# Patient Record
Sex: Female | Born: 1985 | Race: Black or African American | Marital: Single | Smoking: Current some day smoker
Health system: Northeastern US, Academic
[De-identification: ages and names within clinical notes are randomized; demographics above are authoritative.]

## PROBLEM LIST (undated history)

## (undated) DIAGNOSIS — A64 Unspecified sexually transmitted disease: Secondary | ICD-10-CM

## (undated) DIAGNOSIS — T1490XA Injury, unspecified, initial encounter: Secondary | ICD-10-CM

## (undated) HISTORY — PX: SPINE SURGERY: SHX786

## (undated) HISTORY — PX: SKIN BIOPSY: SHX1

## (undated) HISTORY — DX: Injury, unspecified, initial encounter: T14.90XA

## (undated) HISTORY — PX: HERNIA REPAIR: SHX51

---
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
## 2005-04-27 DIAGNOSIS — K5909 Other constipation: Secondary | ICD-10-CM | POA: Insufficient documentation

## 2005-04-27 DIAGNOSIS — D75829 Heparin-induced thrombocytopenia, unspecified: Secondary | ICD-10-CM

## 2005-04-27 DIAGNOSIS — N319 Neuromuscular dysfunction of bladder, unspecified: Secondary | ICD-10-CM | POA: Insufficient documentation

## 2005-04-27 DIAGNOSIS — G909 Disorder of the autonomic nervous system, unspecified: Secondary | ICD-10-CM | POA: Insufficient documentation

## 2005-04-27 DIAGNOSIS — D7582 Heparin induced thrombocytopenia (HIT): Secondary | ICD-10-CM

## 2005-04-27 HISTORY — DX: Heparin-induced thrombocytopenia, unspecified: D75.829

## 2005-04-27 HISTORY — PX: OTHER SURGICAL HISTORY: SHX169

## 2005-04-27 HISTORY — DX: Heparin induced thrombocytopenia (HIT): D75.82

## 2005-04-29 ENCOUNTER — Encounter: Payer: Self-pay | Admitting: Cardiology

## 2005-04-29 DIAGNOSIS — G8254 Quadriplegia, C5-C7 incomplete: Secondary | ICD-10-CM | POA: Insufficient documentation

## 2005-04-29 DIAGNOSIS — H4900 Third [oculomotor] nerve palsy, unspecified eye: Secondary | ICD-10-CM | POA: Insufficient documentation

## 2005-04-29 DIAGNOSIS — G909 Disorder of the autonomic nervous system, unspecified: Secondary | ICD-10-CM

## 2005-04-29 DIAGNOSIS — F32A Depression, unspecified: Secondary | ICD-10-CM

## 2005-04-29 DIAGNOSIS — N319 Neuromuscular dysfunction of bladder, unspecified: Secondary | ICD-10-CM

## 2005-04-29 DIAGNOSIS — Z87440 Personal history of urinary (tract) infections: Secondary | ICD-10-CM | POA: Insufficient documentation

## 2005-04-29 HISTORY — PX: OTHER SURGICAL HISTORY: SHX169

## 2005-04-29 HISTORY — PX: CERVICAL SPINE SURGERY: SHX589

## 2005-04-29 HISTORY — PX: CRANIOTOMY: SHX93

## 2005-04-29 HISTORY — DX: Personal history of urinary (tract) infections: Z87.440

## 2005-04-29 HISTORY — DX: Disorder of the autonomic nervous system, unspecified: G90.9

## 2005-04-29 HISTORY — DX: Quadriplegia, C5-C7 incomplete: G82.54

## 2005-04-29 HISTORY — DX: Neuromuscular dysfunction of bladder, unspecified: N31.9

## 2005-04-29 HISTORY — DX: Depression, unspecified: F32.A

## 2005-04-29 HISTORY — DX: Third (oculomotor) nerve palsy, unspecified eye: H49.00

## 2005-05-04 ENCOUNTER — Encounter: Payer: Self-pay | Admitting: Cardiology

## 2005-05-04 HISTORY — PX: CERVICAL SPINE SURGERY: SHX589

## 2005-05-15 HISTORY — PX: GASTROSTOMY TUBE PLACEMENT: SHX655

## 2005-05-15 HISTORY — PX: TRACHEOSTOMY TUBE PLACEMENT: SHX814

## 2005-05-22 ENCOUNTER — Encounter: Payer: Self-pay | Admitting: Cardiology

## 2005-05-25 DIAGNOSIS — J189 Pneumonia, unspecified organism: Secondary | ICD-10-CM

## 2005-05-25 HISTORY — DX: Pneumonia, unspecified organism: J18.9

## 2005-05-28 DIAGNOSIS — F341 Dysthymic disorder: Secondary | ICD-10-CM | POA: Insufficient documentation

## 2005-05-28 DIAGNOSIS — F32A Depression, unspecified: Secondary | ICD-10-CM | POA: Insufficient documentation

## 2005-05-28 DIAGNOSIS — M62838 Other muscle spasm: Secondary | ICD-10-CM

## 2005-05-28 HISTORY — DX: Other muscle spasm: M62.838

## 2005-07-01 ENCOUNTER — Other Ambulatory Visit: Payer: Self-pay

## 2005-09-14 ENCOUNTER — Encounter: Payer: Self-pay | Admitting: Cardiology

## 2005-09-14 DIAGNOSIS — I959 Hypotension, unspecified: Secondary | ICD-10-CM

## 2005-09-14 HISTORY — DX: Hypotension, unspecified: I95.9

## 2005-10-28 DIAGNOSIS — M86172 Other acute osteomyelitis, left ankle and foot: Secondary | ICD-10-CM

## 2005-10-28 HISTORY — DX: Other acute osteomyelitis, left ankle and foot: M86.172

## 2006-09-27 DIAGNOSIS — M4620 Osteomyelitis of vertebra, site unspecified: Secondary | ICD-10-CM

## 2006-09-27 HISTORY — DX: Osteomyelitis of vertebra, site unspecified: M46.20

## 2007-02-22 ENCOUNTER — Encounter: Payer: Self-pay | Admitting: Cardiology

## 2007-03-29 DIAGNOSIS — L89159 Pressure ulcer of sacral region, unspecified stage: Secondary | ICD-10-CM

## 2007-03-29 HISTORY — DX: Pressure ulcer of sacral region, unspecified stage: L89.159

## 2007-06-01 ENCOUNTER — Encounter: Payer: Self-pay | Admitting: Cardiology

## 2007-06-01 HISTORY — PX: OTHER SURGICAL HISTORY: SHX169

## 2008-06-19 ENCOUNTER — Encounter: Payer: Self-pay | Admitting: Cardiology

## 2008-12-28 DIAGNOSIS — E46 Unspecified protein-calorie malnutrition: Secondary | ICD-10-CM

## 2008-12-28 HISTORY — DX: Unspecified protein-calorie malnutrition: E46

## 2009-02-17 DIAGNOSIS — M4628 Osteomyelitis of vertebra, sacral and sacrococcygeal region: Secondary | ICD-10-CM

## 2009-02-17 HISTORY — DX: Osteomyelitis of vertebra, sacral and sacrococcygeal region: M46.28

## 2009-07-30 DIAGNOSIS — M869 Osteomyelitis, unspecified: Secondary | ICD-10-CM

## 2009-07-30 HISTORY — DX: Osteomyelitis, unspecified: M86.9

## 2009-10-25 ENCOUNTER — Ambulatory Visit
Admit: 2009-10-25 | Discharge: 2009-10-25 | Disposition: A | Discharge status: Home / Self Care | Payer: Self-pay | Source: Ambulatory Visit | Attending: Surgery | Admitting: Surgery

## 2009-11-04 ENCOUNTER — Ambulatory Visit: Payer: Self-pay | Admitting: Obstetrics & Gynecology

## 2009-11-04 ENCOUNTER — Ambulatory Visit: Payer: Self-pay

## 2009-11-11 ENCOUNTER — Ambulatory Visit
Admit: 2009-11-11 | Discharge: 2009-11-11 | Disposition: A | Discharge status: Home / Self Care | Payer: Self-pay | Source: Ambulatory Visit | Attending: Urology | Admitting: Urology

## 2009-11-11 ENCOUNTER — Ambulatory Visit: Payer: Self-pay | Admitting: Physical Medicine and Rehabilitation

## 2009-11-11 LAB — COMPREHENSIVE METABOLIC PANEL
ALT: 11 U/L (ref 0–35)
AST: 18 U/L (ref 0–35)
Albumin: 4.4 g/dL (ref 3.5–5.2)
Alk Phos: 71 U/L (ref 35–105)
Anion Gap: 12 (ref 7–16)
Bilirubin,Total: 0.3 mg/dL (ref 0.0–1.2)
CO2: 23 mmol/L (ref 20–28)
Calcium: 9.1 mg/dL (ref 9.0–10.4)
Chloride: 108 mmol/L (ref 96–108)
Creatinine: 0.44 mg/dL — ABNORMAL LOW (ref 0.51–0.95)
GFR,Black: >59 *
GFR,Caucasian: >59 *
Glucose: 91 mg/dL (ref 74–106)
Lab: 9 mg/dL (ref 6–20)
Potassium: 3.3 mmol/L (ref 3.3–5.1)
Sodium: 143 mmol/L (ref 133–145)
Total Protein: 7.6 g/dL (ref 6.3–7.7)

## 2009-11-11 LAB — URINE MICROSCOPIC (IQ200)

## 2009-11-11 LAB — PREGNANCY, URINE: Preg Test,UR: NEGATIVE m[IU]/mL

## 2009-11-11 LAB — URINALYSIS WITH REFLEX TO MICROSCOPIC
Nitrite,UA: POSITIVE — AB
Protein,UA: 30 mg/dL — AB
Specific Gravity,UA: 1.02 (ref 1.002–1.030)
pH,UA: 6 (ref 5.0–8.0)

## 2009-11-11 LAB — CBC
Hematocrit: 38 % (ref 34–45)
Hemoglobin: 12.5 g/dL (ref 11.2–15.7)
MCV: 95 fL (ref 79–95)
Platelets: 97 THOU/uL — ABNORMAL LOW (ref 160–370)
RBC: 4 MIL/uL (ref 3.9–5.2)
RDW: 16.8 % — ABNORMAL HIGH (ref 11.7–14.4)
WBC: 6.7 THOU/uL (ref 4.0–10.0)

## 2009-11-11 LAB — DATE/TIME NOT PROVIDED

## 2009-11-12 LAB — AEROBIC CULTURE

## 2009-11-14 ENCOUNTER — Ambulatory Visit: Admit: 2009-11-14 | Payer: Self-pay | Source: Ambulatory Visit | Admitting: Urology
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
## 2009-11-17 ENCOUNTER — Other Ambulatory Visit: Payer: Self-pay | Admitting: Gastroenterology

## 2009-11-17 ENCOUNTER — Encounter: Payer: Self-pay | Admitting: Cardiology

## 2009-11-17 ENCOUNTER — Inpatient Hospital Stay
Admit: 2009-11-17 | Disposition: A | Discharge status: Home / Self Care | Payer: Self-pay | Source: Ambulatory Visit | Attending: Pulmonology | Admitting: Pulmonology

## 2009-11-17 LAB — CBC AND DIFFERENTIAL
Baso # K/uL: 0 THOU/uL (ref 0.0–0.1)
Basophil %: 0 % (ref 0.1–1.2)
Eos # K/uL: 0 THOU/uL (ref 0.0–0.4)
Eosinophil %: 0 % — ABNORMAL LOW (ref 0.7–5.8)
Hematocrit: 36 % (ref 34–45)
Hemoglobin: 11.9 g/dL (ref 11.2–15.7)
Lymph # K/uL: 1.1 THOU/uL — ABNORMAL LOW (ref 1.2–3.7)
Lymphocyte %: 17 % — ABNORMAL LOW (ref 19.3–51.7)
MCV: 92 fL (ref 79–95)
Mono # K/uL: 0.1 THOU/uL — ABNORMAL LOW (ref 0.2–0.9)
Monocyte %: 2 % — ABNORMAL LOW (ref 4.7–12.5)
Neut # K/uL: 4.7 THOU/uL (ref 1.6–6.1)
Platelets: 70 THOU/uL — ABNORMAL LOW (ref 160–370)
RBC: 3.9 MIL/uL (ref 3.9–5.2)
RDW: 15.9 % — ABNORMAL HIGH (ref 11.7–14.4)
Seg Neut %: 63 % (ref 34.0–71.1)
WBC: 5.9 THOU/uL (ref 4.0–10.0)

## 2009-11-17 LAB — URINE MICROSCOPIC (IQ200)
RBC,UA: 56 /HPF — ABNORMAL HIGH (ref 0–2)
WBC,UA: 7 /HPF — AB (ref 0–5)

## 2009-11-17 LAB — URINALYSIS WITH REFLEX TO MICROSCOPIC
Leuk Esterase,UA: NEGATIVE
Nitrite,UA: NEGATIVE
Protein,UA: 300 mg/dL — AB
Specific Gravity,UA: 1.027 (ref 1.002–1.030)
pH,UA: 6 (ref 5.0–8.0)

## 2009-11-17 LAB — PLASMA PROF 7 (ED ONLY)
Anion Gap,PL: 12 (ref 7–16)
CO2,Plasma: 23 mmol/L (ref 20–28)
Chloride,Plasma: 101 mmol/L (ref 96–108)
Creatinine: 0.5 mg/dL — ABNORMAL LOW (ref 0.51–0.95)
GFR,Black: >59 *
GFR,Caucasian: >59 *
Glucose,Plasma: 96 mg/dL (ref 74–106)
Potassium,Plasma: 3.3 mmol/L — ABNORMAL LOW (ref 3.4–4.7)
Sodium,Plasma: 136 mmol/L (ref 132–146)
UN,Plasma: 15 mg/dL (ref 6–20)

## 2009-11-17 LAB — REACTIVE LYMPHS: React Lymph %: 1 % (ref 0–6)

## 2009-11-17 LAB — MANUAL DIFFERENTIAL

## 2009-11-17 LAB — LACTATE, PLASMA: Lactate: 1.4 mmol/L (ref 0.5–2.2)

## 2009-11-17 LAB — BANDS: Bands %: 17 % — ABNORMAL HIGH (ref 0–10)

## 2009-11-17 LAB — HOLD SST

## 2009-11-17 LAB — HOLD BLUE

## 2009-11-17 LAB — S. PNEUMONIAE ANTIGEN: S. pneumoniae Antigen: DETECTED

## 2009-11-17 LAB — HOLD RED: Hold Red: 1

## 2009-11-18 DIAGNOSIS — D649 Anemia, unspecified: Secondary | ICD-10-CM

## 2009-11-18 HISTORY — DX: Anemia, unspecified: D64.9

## 2009-11-18 LAB — CBC AND DIFFERENTIAL
Baso # K/uL: 0 THOU/uL (ref 0.0–0.1)
Basophil %: 0 % (ref 0.1–1.2)
Eos # K/uL: 0 THOU/uL (ref 0.0–0.4)
Eosinophil %: 0.5 % — ABNORMAL LOW (ref 0.7–5.8)
Hematocrit: 30 % — ABNORMAL LOW (ref 34–45)
Hemoglobin: 9.4 g/dL — ABNORMAL LOW (ref 11.2–15.7)
Lymph # K/uL: 1 THOU/uL — ABNORMAL LOW (ref 1.2–3.7)
Lymphocyte %: 16.6 % — ABNORMAL LOW (ref 19.3–51.7)
MCV: 91 fL (ref 79–95)
Mono # K/uL: 0.2 THOU/uL (ref 0.2–0.9)
Monocyte %: 4 % — ABNORMAL LOW (ref 4.7–12.5)
Neut # K/uL: 4.8 THOU/uL (ref 1.6–6.1)
Platelets: 68 THOU/uL — ABNORMAL LOW (ref 160–370)
RBC: 3.3 MIL/uL — ABNORMAL LOW (ref 3.9–5.2)
RDW: 15.8 % — ABNORMAL HIGH (ref 11.7–14.4)
Seg Neut %: 78.9 % — ABNORMAL HIGH (ref 34.0–71.1)
WBC: 6 THOU/uL (ref 4.0–10.0)

## 2009-11-18 LAB — ARTERIAL BLOOD PANEL, ICU
Glucose,WB: 86 mg/dL (ref 65–110)
ICA @7.4,WB: 4.7 mg/dL — ABNORMAL LOW (ref 4.8–5.2)
ICA Uncorr,WB: 4.8 mg/dL
Lactate ART,WB: 1.3 mmol/L — ABNORMAL HIGH (ref 0.3–0.8)
NA, WB: 142 mmol/L (ref 135–145)
Potassium,WB: 3.4 mmol/L (ref 3.4–4.7)

## 2009-11-18 LAB — BASIC METABOLIC PANEL
Anion Gap: 11 (ref 7–16)
CO2: 23 mmol/L (ref 20–28)
Calcium: 7.6 mg/dL — ABNORMAL LOW (ref 9.0–10.4)
Chloride: 109 mmol/L — ABNORMAL HIGH (ref 96–108)
Creatinine: 0.43 mg/dL — ABNORMAL LOW (ref 0.51–0.95)
GFR,Black: >59 *
GFR,Caucasian: >59 *
Glucose: 108 mg/dL — ABNORMAL HIGH (ref 74–106)
Lab: 8 mg/dL (ref 6–20)
Potassium: 3.3 mmol/L (ref 3.3–5.1)
Sodium: 143 mmol/L (ref 133–145)

## 2009-11-18 LAB — RESP THERAPY TYPE

## 2009-11-18 LAB — BLOOD GASES, ARTERIAL, ICU
Base Excess, Arterial: 0 mmol/L (ref ?–2)
Buffer Base: 46.9 mmol/L
CO2,ART (Calc): 28 mmol/L (ref 21–28)
CO: 1.2 %
FO2 Hb, Arterial: 83 % — ABNORMAL LOW (ref 90–95)
HCO3, Arterial: 26 mmol/L — ABNORMAL HIGH (ref 19–23)
Hemoglobin: 12.6 g/dL (ref 11.2–15.7)
Methemoglobin: 0.3 % (ref 0.0–1.0)
O2 Content, Arterial: 14 mL/dL — ABNORMAL LOW (ref 16.3–21.7)
pCO2, Arterial: 48 mmHg — ABNORMAL HIGH (ref 33–43)
pH: 7.36 (ref 7.36–7.44)
pO2,Arterial: 52 mmHg — ABNORMAL LOW (ref 80–100)

## 2009-11-18 LAB — IONIZED CALCIUM,SERUM: Ionized CA Uncorrected: 4.2 mg/dL

## 2009-11-18 LAB — GRAM STAIN
Gram Stain: >10
Gram Stain: <=10

## 2009-11-18 LAB — PREALBUMIN: Prealbumin: 6 mg/dL — ABNORMAL LOW (ref 20–40)

## 2009-11-19 LAB — CBC AND DIFFERENTIAL
Baso # K/uL: 0 THOU/uL (ref 0.0–0.1)
Baso # K/uL: 0 THOU/uL (ref 0.0–0.1)
Basophil %: 0.1 % (ref 0.1–1.2)
Basophil %: 0.1 % (ref 0.1–1.2)
Eos # K/uL: 0.1 THOU/uL (ref 0.0–0.4)
Eos # K/uL: 0.1 THOU/uL (ref 0.0–0.4)
Eosinophil %: 1 % (ref 0.7–5.8)
Eosinophil %: 1.2 % (ref 0.7–5.8)
Hematocrit: 30 % — ABNORMAL LOW (ref 34–45)
Hematocrit: 29 % — ABNORMAL LOW (ref 34–45)
Hemoglobin: 9.7 g/dL — ABNORMAL LOW (ref 11.2–15.7)
Hemoglobin: 9.1 g/dL — ABNORMAL LOW (ref 11.2–15.7)
Lymph # K/uL: 1.5 THOU/uL (ref 1.2–3.7)
Lymph # K/uL: 1.6 THOU/uL (ref 1.2–3.7)
Lymphocyte %: 16.9 % — ABNORMAL LOW (ref 19.3–51.7)
Lymphocyte %: 21 % (ref 19.3–51.7)
MCV: 91 fL (ref 79–95)
MCV: 90 fL (ref 79–95)
Mono # K/uL: 0.6 THOU/uL (ref 0.2–0.9)
Mono # K/uL: 0.6 THOU/uL (ref 0.2–0.9)
Monocyte %: 6.7 % (ref 4.7–12.5)
Monocyte %: 7.3 % (ref 4.7–12.5)
Neut # K/uL: 6.7 THOU/uL — ABNORMAL HIGH (ref 1.6–6.1)
Neut # K/uL: 5.5 THOU/uL (ref 1.6–6.1)
Platelets: 77 THOU/uL — ABNORMAL LOW (ref 160–370)
Platelets: 82 THOU/uL — ABNORMAL LOW (ref 160–370)
RBC: 3.3 MIL/uL — ABNORMAL LOW (ref 3.9–5.2)
RBC: 3.3 MIL/uL — ABNORMAL LOW (ref 3.9–5.2)
RDW: 15.9 % — ABNORMAL HIGH (ref 11.7–14.4)
RDW: 15.9 % — ABNORMAL HIGH (ref 11.7–14.4)
Seg Neut %: 75.3 % — ABNORMAL HIGH (ref 34.0–71.1)
Seg Neut %: 70.4 % (ref 34.0–71.1)
WBC: 8.9 THOU/uL (ref 4.0–10.0)
WBC: 7.8 THOU/uL (ref 4.0–10.0)

## 2009-11-19 LAB — RESP THERAPY TYPE

## 2009-11-19 LAB — BASIC METABOLIC PANEL
Anion Gap: 10 (ref 7–16)
CO2: 25 mmol/L (ref 20–28)
Calcium: 8.3 mg/dL — ABNORMAL LOW (ref 9.0–10.4)
Chloride: 108 mmol/L (ref 96–108)
Creatinine: 0.39 mg/dL — ABNORMAL LOW (ref 0.51–0.95)
GFR,Black: >59 *
GFR,Caucasian: >59 *
Glucose: 76 mg/dL (ref 74–106)
Lab: 6 mg/dL (ref 6–20)
Potassium: 3.4 mmol/L (ref 3.3–5.1)
Sodium: 143 mmol/L (ref 133–145)

## 2009-11-19 LAB — PROTIME-INR
INR: 1.3 — ABNORMAL HIGH (ref 0.9–1.1)
Protime: 16 s — ABNORMAL HIGH (ref 11.9–14.7)

## 2009-11-19 LAB — COMPREHENSIVE METABOLIC PANEL
ALT: 14 U/L (ref 0–35)
AST: 22 U/L (ref 0–35)
Albumin: 3 g/dL — ABNORMAL LOW (ref 3.5–5.2)
Alk Phos: 61 U/L (ref 35–105)
Anion Gap: 9 (ref 7–16)
Bilirubin,Total: 0.7 mg/dL (ref 0.0–1.2)
CO2: 25 mmol/L (ref 20–28)
Calcium: 8.2 mg/dL — ABNORMAL LOW (ref 9.0–10.4)
Chloride: 108 mmol/L (ref 96–108)
Creatinine: 0.36 mg/dL — ABNORMAL LOW (ref 0.51–0.95)
GFR,Black: >59 *
GFR,Caucasian: >59 *
Glucose: 79 mg/dL (ref 74–106)
Lab: 5 mg/dL — ABNORMAL LOW (ref 6–20)
Potassium: 3.2 mmol/L — ABNORMAL LOW (ref 3.3–5.1)
Sodium: 142 mmol/L (ref 133–145)
Total Protein: 5.8 g/dL — ABNORMAL LOW (ref 6.3–7.7)

## 2009-11-19 LAB — BLOOD GASES, ARTERIAL, ICU
Base Excess, Arterial: 1 mmol/L (ref ?–2)
Buffer Base: 47.5 mmol/L
CO2,ART (Calc): 28 mmol/L (ref 21–28)
CO: 0.4 %
FO2 Hb, Arterial: 95 % (ref 90–95)
HCO3, Arterial: 26 mmol/L — ABNORMAL HIGH (ref 19–23)
Hemoglobin: 11.6 g/dL (ref 11.2–15.7)
Inspired Air: 100
Methemoglobin: 0 % (ref 0.0–1.0)
O2 Content, Arterial: 14.8 mL/dL — ABNORMAL LOW (ref 16.3–21.7)
pCO2, Arterial: 46 mmHg — ABNORMAL HIGH (ref 33–43)
pH: 7.38 (ref 7.36–7.44)
pO2,Arterial: 78 mmHg — ABNORMAL LOW (ref 80–100)

## 2009-11-19 LAB — WHOLE BLOOD CALCIUM IONIZED
ICA @7.4,WB: 4.7 mg/dL — ABNORMAL LOW (ref 4.8–5.2)
ICA @7.4,WB: 4.7 mg/dL — ABNORMAL LOW (ref 4.8–5.2)
ICA Uncorr,WB: 4.8 mg/dL
ICA Uncorr,WB: 4.6 mg/dL

## 2009-11-19 LAB — MAGNESIUM: Magnesium: 1.5 meq/L (ref 1.3–2.1)

## 2009-11-19 LAB — BILIRUBIN, DIRECT: Bilirubin,Direct: 0.4 mg/dL — ABNORMAL HIGH (ref 0.0–0.3)

## 2009-11-19 LAB — LACTATE, PLASMA: Lactate: 0.7 mmol/L (ref 0.5–2.2)

## 2009-11-19 LAB — HOLD RED: Hold Red: 1

## 2009-11-19 LAB — APTT: aPTT: 36.4 s — ABNORMAL HIGH (ref 22.3–35.3)

## 2009-11-19 LAB — PHOSPHORUS: Phosphorus: 1.7 mg/dL — ABNORMAL LOW (ref 2.7–4.5)

## 2009-11-19 LAB — GGT: GGT: 35 U/L (ref 5–36)

## 2009-11-19 NOTE — ED Provider Notes (Unsigned)
VISIT NUMBER:  [id number].    I saw and evaluated the patient.  I agree with the resident's/fellow's  findings and plan of care as documented.  Details of my evaluation are as  follows:    Seen by me at approximately 4:05 p.m.    HPI:  This is a 23 year old woman with a history of quadriplegia 4 years  ago who presents with chest pain on her right side, shortness of breath,  fever, and, over the last 24 hours, also foul-smelling urine.  The patient  has been on outpatient antibiotics amoxicillin and ceftriaxone and Cipro  without relief in her symptoms.  She denies nausea, vomiting, diarrhea.  She is not more dyspneic than usual.    ALLERGIES:   Heparin, vancomycin, and Macrobid.  MEDICATIONS:  Amoxicillin.  Ceftriaxone.  Cipro.    PAST MEDICAL / SURGICAL HISTORY:  Frequent UTIs.  Chronic Foley.  Neurogenic bladder.  Quadriplegia.  A decubitus ulcer for which she was hospitalized a year ago which she now  says is healed.    FAMILY HISTORY:  None related to this visit.    SOCIAL HISTORY:  Here with her mom.    REVIEW OF SYSTEMS:  General:  Fevers.  HEENT:  Cough.  Pulmonary:  Shortness of breath and chest pain.  GI:  No vomiting, no diarrhea.  GU:  Foul-smelling urine and has an indwelling Foley.  Neurologic:  Quadriplegia.  All other systems reviewed and negative.    PHYSICAL EXAMINATION:  The patient is awake and alert.  She is tachycardic to 116.  Temperature:  38.3.  Blood pressure here:  96/50.  O2 saturation:  97% on 4 L.  Eyes:  Conjunctivae pink.  HENT:  Dry mucous membranes.  Heart:  Tachycardic, regular rate and rhythm, no murmur.  Pulmonary:  She has crackles at both bases.  Abdomen:  Soft, nontender.  Skin:  Normal color.  Extremities:  She has contractures in her upper and lower extremities.  She  is able to flex at the elbow and lift her arms.  Psychiatric:  Normal affect.    MEDICAL DECISION MAKING/CONDITION:  This is a patient with quadriplegia and  cough, chest pain, and shortness of breath.         DDX:  My major concern is that she has pneumonia, a UTI, or other        serious infection that she is at high risk for.  She is not hypoxic.          PLAN:  She will be switched to Avelox and admitted to the hospital  for IV antibiotics.    DATA:        LABS:  Her lactate came back 1.1.  Her white count was 5.9,        hematocrit of 36.  Her electrolytes are significant for mild        hypokalemia with a potassium of 3.3 which we will replete.        RADIOLOGIC STUDIES:  Her chest x-ray shows a left lower lobe  infiltrate with effusion.        ECG:        RHYTHM STRIPS:    PROCEDURE (Attestation):  CRITICAL CARE TIME (Time to perform separately billable procedures  subtracted from CC time):  CONSULT:  PCP NOTIFIED:  SMOKING CESSATION COUNSELING:    FOLLOW-UP NOTE AND DISPOSITION:    ED DIAGNOSIS:  Pneumonia with effusion.  Unreviewed              ___________________________________________  Pauline Good, MD      DD:   11/17/2009  DT:   11/17/2009  5:15 P  ZOX/WR6#[id number]  [id number]    cc:
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
## 2009-11-20 LAB — APTT: aPTT: 38.8 s — ABNORMAL HIGH (ref 22.3–35.3)

## 2009-11-20 LAB — CBC
Hematocrit: 28 % — ABNORMAL LOW (ref 34–45)
Hemoglobin: 9.1 g/dL — ABNORMAL LOW (ref 11.2–15.7)
MCV: 93 fL (ref 79–95)
Platelets: 83 THOU/uL — ABNORMAL LOW (ref 160–370)
RBC: 3.1 MIL/uL — ABNORMAL LOW (ref 3.9–5.2)
RDW: 15.9 % — ABNORMAL HIGH (ref 11.7–14.4)
WBC: 7.8 THOU/uL (ref 4.0–10.0)

## 2009-11-20 LAB — BLOOD GASES, ARTERIAL, ICU
Base Excess, Arterial: 4 mmol/L — ABNORMAL HIGH (ref ?–2)
Buffer Base: 49.5 mmol/L
CO2,ART (Calc): 30 mmol/L — ABNORMAL HIGH (ref 21–28)
CO: 0.1 %
FO2 Hb, Arterial: 97 % — ABNORMAL HIGH (ref 90–95)
HCO3, Arterial: 28 mmol/L — ABNORMAL HIGH (ref 19–23)
Hemoglobin: 9.2 g/dL — ABNORMAL LOW (ref 11.2–15.7)
Inspired Air: 80
Methemoglobin: 0.1 % (ref 0.0–1.0)
O2 Content, Arterial: 12.1 mL/dL — ABNORMAL LOW (ref 16.3–21.7)
pCO2, Arterial: 44 mmHg — ABNORMAL HIGH (ref 33–43)
pH: 7.42 (ref 7.36–7.44)
pO2,Arterial: 88 mmHg (ref 80–100)

## 2009-11-20 LAB — LACTATE, PLASMA: Lactate: 0.7 mmol/L (ref 0.5–2.2)

## 2009-11-20 LAB — ARTERIAL BLOOD PANEL, ICU
Glucose,WB: 100 mg/dL (ref 65–110)
ICA @7.4,WB: 4.6 mg/dL — ABNORMAL LOW (ref 4.8–5.2)
ICA Uncorr,WB: 4.6 mg/dL
Lactate ART,WB: 0.7 mmol/L (ref 0.3–0.8)
NA, WB: 138 mmol/L (ref 135–145)
Potassium,WB: 3.6 mmol/L (ref 3.4–4.7)

## 2009-11-20 LAB — PHOSPHORUS: Phosphorus: 2 mg/dL — ABNORMAL LOW (ref 2.7–4.5)

## 2009-11-20 LAB — BASIC METABOLIC PANEL
Anion Gap: 11 (ref 7–16)
CO2: 24 mmol/L (ref 20–28)
Calcium: 7.9 mg/dL — ABNORMAL LOW (ref 9.0–10.4)
Chloride: 110 mmol/L — ABNORMAL HIGH (ref 96–108)
Creatinine: 0.48 mg/dL — ABNORMAL LOW (ref 0.51–0.95)
GFR,Black: >59 *
GFR,Caucasian: >59 *
Glucose: 97 mg/dL (ref 74–106)
Lab: 4 mg/dL — ABNORMAL LOW (ref 6–20)
Potassium: 3.5 mmol/L (ref 3.3–5.1)
Sodium: 145 mmol/L (ref 133–145)

## 2009-11-20 LAB — RESP THERAPY TYPE
PEEP: 12 cmH2O
Tidal Volume: 450 mL
Vent Rate: 14 {beats}/min

## 2009-11-20 LAB — MAGNESIUM: Magnesium: 1.4 meq/L (ref 1.3–2.1)

## 2009-11-20 LAB — PROTIME-INR
INR: 1.3 — ABNORMAL HIGH (ref 0.9–1.1)
Protime: 16.9 s — ABNORMAL HIGH (ref 11.9–14.7)

## 2009-11-20 LAB — WHOLE BLOOD CALCIUM IONIZED
ICA @7.4,WB: 4.7 mg/dL — ABNORMAL LOW (ref 4.8–5.2)
ICA Uncorr,WB: 4.6 mg/dL

## 2009-11-20 LAB — POCT GLUCOSE: Glucose POCT: 97 mg/dL (ref 74–106)

## 2009-11-20 LAB — AEROBIC CULTURE: Aerobic Culture: NORMAL

## 2009-11-21 LAB — GIANT PLATELETS

## 2009-11-21 LAB — CBC AND DIFFERENTIAL
Baso # K/uL: 0 THOU/uL (ref 0.0–0.1)
Basophil %: 0 % (ref 0.1–1.2)
Eos # K/uL: 0.3 THOU/uL (ref 0.0–0.4)
Eosinophil %: 4 % (ref 0.7–5.8)
Hematocrit: 26 % — ABNORMAL LOW (ref 34–45)
Hemoglobin: 8.2 g/dL — ABNORMAL LOW (ref 11.2–15.7)
Lymph # K/uL: 3.1 THOU/uL (ref 1.2–3.7)
Lymphocyte %: 39 % (ref 19.3–51.7)
MCV: 91 fL (ref 79–95)
Mono # K/uL: 0.5 THOU/uL (ref 0.2–0.9)
Monocyte %: 6 % (ref 4.7–12.5)
Neut # K/uL: 4.1 THOU/uL (ref 1.6–6.1)
Platelets: 88 THOU/uL — ABNORMAL LOW (ref 160–370)
RBC: 2.9 MIL/uL — ABNORMAL LOW (ref 3.9–5.2)
RDW: 16 % — ABNORMAL HIGH (ref 11.7–14.4)
Seg Neut %: 50 % (ref 34.0–71.1)
WBC: 8 THOU/uL (ref 4.0–10.0)

## 2009-11-21 LAB — MISC. CELL %: Misc. Cell %: 0 % (ref 0–0)

## 2009-11-21 LAB — ARTERIAL BLOOD PANEL, ICU
Glucose,WB: 135 mg/dL — ABNORMAL HIGH (ref 65–110)
ICA @7.4,WB: 4.7 mg/dL — ABNORMAL LOW (ref 4.8–5.2)
ICA Uncorr,WB: 4.8 mg/dL
Lactate ART,WB: 1.1 mmol/L — ABNORMAL HIGH (ref 0.3–0.8)
NA, WB: 140 mmol/L (ref 135–145)
Potassium,WB: 4.2 mmol/L (ref 3.4–4.7)

## 2009-11-21 LAB — BLOOD GASES, ARTERIAL, ICU
Base Excess, Arterial: 2 mmol/L (ref ?–2)
Buffer Base: 48.6 mmol/L
CO2,ART (Calc): 29 mmol/L — ABNORMAL HIGH (ref 21–28)
CO: 0.5 %
FO2 Hb, Arterial: 93 % (ref 90–95)
HCO3, Arterial: 28 mmol/L — ABNORMAL HIGH (ref 19–23)
Hemoglobin: 11.8 g/dL (ref 11.2–15.7)
Inspired Air: 60
Methemoglobin: 0.1 % (ref 0.0–1.0)
O2 Content, Arterial: 14.7 mL/dL — ABNORMAL LOW (ref 16.3–21.7)
pCO2, Arterial: 49 mmHg — ABNORMAL HIGH (ref 33–43)
pH: 7.37 (ref 7.36–7.44)
pO2,Arterial: 70 mmHg — ABNORMAL LOW (ref 80–100)

## 2009-11-21 LAB — BANDS: Bands %: 1 % (ref 0–10)

## 2009-11-21 LAB — PREALBUMIN: Prealbumin: 4 mg/dL — ABNORMAL LOW (ref 20–40)

## 2009-11-21 LAB — BASIC METABOLIC PANEL
Anion Gap: 8 (ref 7–16)
CO2: 26 mmol/L (ref 20–28)
Calcium: 8.2 mg/dL — ABNORMAL LOW (ref 9.0–10.4)
Chloride: 106 mmol/L (ref 96–108)
Creatinine: 0.49 mg/dL — ABNORMAL LOW (ref 0.51–0.95)
GFR,Black: >59 *
GFR,Caucasian: >59 *
Glucose: 133 mg/dL — ABNORMAL HIGH (ref 74–106)
Lab: 3 mg/dL — ABNORMAL LOW (ref 6–20)
Potassium: 3.3 mmol/L (ref 3.3–5.1)
Sodium: 140 mmol/L (ref 133–145)

## 2009-11-21 LAB — RESP THERAPY TYPE
PEEP: 10 cmH2O
Tidal Volume: 450 mL
Vent Rate: 14 {beats}/min

## 2009-11-21 LAB — AEROBIC CULTURE

## 2009-11-21 LAB — MANUAL DIFFERENTIAL

## 2009-11-21 LAB — PHOSPHORUS: Phosphorus: 1.9 mg/dL — ABNORMAL LOW (ref 2.7–4.5)

## 2009-11-21 LAB — MAGNESIUM: Magnesium: 1.4 meq/L (ref 1.3–2.1)

## 2009-11-22 LAB — BASIC METABOLIC PANEL
Anion Gap: 4 — ABNORMAL LOW (ref 7–16)
CO2: 25 mmol/L (ref 20–28)
Calcium: 8.1 mg/dL — ABNORMAL LOW (ref 9.0–10.4)
Chloride: 109 mmol/L — ABNORMAL HIGH (ref 96–108)
Creatinine: 0.43 mg/dL — ABNORMAL LOW (ref 0.51–0.95)
GFR,Black: >59 *
GFR,Caucasian: >59 *
Glucose: 143 mg/dL — ABNORMAL HIGH (ref 74–106)
Lab: 5 mg/dL — ABNORMAL LOW (ref 6–20)
Potassium: 4.9 mmol/L (ref 3.3–5.1)
Sodium: 138 mmol/L (ref 133–145)

## 2009-11-22 LAB — REACTIVE LYMPHS: React Lymph %: 2 % (ref 0–6)

## 2009-11-22 LAB — MISC. CELL %: Misc. Cell %: 0 % (ref 0–0)

## 2009-11-22 LAB — CBC AND DIFFERENTIAL
Baso # K/uL: 0 THOU/uL (ref 0.0–0.1)
Basophil %: 0 % (ref 0.1–1.2)
Eos # K/uL: 0.3 THOU/uL (ref 0.0–0.4)
Eosinophil %: 3 % (ref 0.7–5.8)
Hematocrit: 26 % — ABNORMAL LOW (ref 34–45)
Hemoglobin: 8.2 g/dL — ABNORMAL LOW (ref 11.2–15.7)
Lymph # K/uL: 1.8 THOU/uL (ref 1.2–3.7)
Lymphocyte %: 19 % — ABNORMAL LOW (ref 19.3–51.7)
MCV: 93 fL (ref 79–95)
Mono # K/uL: 0.7 THOU/uL (ref 0.2–0.9)
Monocyte %: 8 % (ref 4.7–12.5)
Neut # K/uL: 5.9 THOU/uL (ref 1.6–6.1)
Platelets: 83 THOU/uL — ABNORMAL LOW (ref 160–370)
RBC: 2.8 MIL/uL — ABNORMAL LOW (ref 3.9–5.2)
RDW: 16.3 % — ABNORMAL HIGH (ref 11.7–14.4)
Seg Neut %: 69 % (ref 34.0–71.1)
WBC: 8.5 THOU/uL (ref 4.0–10.0)

## 2009-11-22 LAB — MAGNESIUM: Magnesium: 1.5 meq/L (ref 1.3–2.1)

## 2009-11-22 LAB — PHOSPHORUS: Phosphorus: 1.9 mg/dL — ABNORMAL LOW (ref 2.7–4.5)

## 2009-11-22 LAB — MANUAL DIFFERENTIAL

## 2009-11-22 LAB — GIANT PLATELETS

## 2009-11-23 LAB — CBC AND DIFFERENTIAL
Baso # K/uL: 0 THOU/uL (ref 0.0–0.1)
Basophil %: 0 % (ref 0.1–1.2)
Eos # K/uL: 0.3 THOU/uL (ref 0.0–0.4)
Eosinophil %: 4 % (ref 0.7–5.8)
Hematocrit: 24 % — ABNORMAL LOW (ref 34–45)
Hemoglobin: 8.2 g/dL — ABNORMAL LOW (ref 11.2–15.7)
Lymph # K/uL: 1.9 THOU/uL (ref 1.2–3.7)
Lymphocyte %: 21 % (ref 19.3–51.7)
MCV: 97 fL — ABNORMAL HIGH (ref 79–95)
Mono # K/uL: 0.4 THOU/uL (ref 0.2–0.9)
Monocyte %: 5 % (ref 4.7–12.5)
Neut # K/uL: 5.5 THOU/uL (ref 1.6–6.1)
Platelets: 103 THOU/uL — ABNORMAL LOW (ref 160–370)
RBC: 2.5 MIL/uL — ABNORMAL LOW (ref 3.9–5.2)
RDW: 18.2 % — ABNORMAL HIGH (ref 11.7–14.4)
Seg Neut %: 61 % (ref 34.0–71.1)
WBC: 8.2 THOU/uL (ref 4.0–10.0)

## 2009-11-23 LAB — BASIC METABOLIC PANEL
Anion Gap: 7 (ref 7–16)
CO2: 27 mmol/L (ref 20–28)
Calcium: 8.4 mg/dL — ABNORMAL LOW (ref 9.0–10.4)
Chloride: 103 mmol/L (ref 96–108)
Creatinine: 0.46 mg/dL — ABNORMAL LOW (ref 0.51–0.95)
GFR,Black: >59 *
GFR,Caucasian: >59 *
Glucose: 175 mg/dL — ABNORMAL HIGH (ref 74–106)
Lab: 7 mg/dL (ref 6–20)
Potassium: 4.8 mmol/L (ref 3.3–5.1)
Sodium: 137 mmol/L (ref 133–145)

## 2009-11-23 LAB — BLOOD CULTURE: Bacterial Blood Culture: NO GROWTH

## 2009-11-23 LAB — BANDS: Bands %: 6 % (ref 0–10)

## 2009-11-23 LAB — MANUAL DIFFERENTIAL

## 2009-11-23 LAB — MAGNESIUM: Magnesium: 1.7 meq/L (ref 1.3–2.1)

## 2009-11-23 LAB — VACUOLATED SEGS

## 2009-11-23 LAB — MISC. CELL %: Misc. Cell %: 0 % (ref 0–0)

## 2009-11-23 LAB — REACTIVE LYMPHS: React Lymph %: 3 % (ref 0–6)

## 2009-11-23 LAB — PHOSPHORUS: Phosphorus: 2.6 mg/dL — ABNORMAL LOW (ref 2.7–4.5)

## 2009-11-23 LAB — METAMYELOCYTE: Metamyelocyte %: 1 % (ref 0–1)

## 2009-11-23 LAB — TOXIC GRAN

## 2009-11-23 LAB — GIANT PLATELETS

## 2009-11-24 LAB — BASIC METABOLIC PANEL
Anion Gap: 8 (ref 7–16)
CO2: 29 mmol/L — ABNORMAL HIGH (ref 20–28)
Calcium: 8.6 mg/dL — ABNORMAL LOW (ref 9.0–10.4)
Chloride: 102 mmol/L (ref 96–108)
Creatinine: 0.5 mg/dL — ABNORMAL LOW (ref 0.51–0.95)
GFR,Black: >59 *
GFR,Caucasian: >59 *
Glucose: 150 mg/dL — ABNORMAL HIGH (ref 74–106)
Lab: 9 mg/dL (ref 6–20)
Potassium: 4.5 mmol/L (ref 3.3–5.1)
Sodium: 139 mmol/L (ref 133–145)

## 2009-11-24 LAB — CBC AND DIFFERENTIAL
Baso # K/uL: 0 THOU/uL (ref 0.0–0.1)
Basophil %: 0 % (ref 0.1–1.2)
Eos # K/uL: 0.4 THOU/uL (ref 0.0–0.4)
Eosinophil %: 5 % (ref 0.7–5.8)
Hematocrit: 26 % — ABNORMAL LOW (ref 34–45)
Hemoglobin: 8.1 g/dL — ABNORMAL LOW (ref 11.2–15.7)
Lymph # K/uL: 2.8 THOU/uL (ref 1.2–3.7)
Lymphocyte %: 33 % (ref 19.3–51.7)
MCV: 94 fL (ref 79–95)
Mono # K/uL: 0.6 THOU/uL (ref 0.2–0.9)
Monocyte %: 7 % (ref 4.7–12.5)
Neut # K/uL: 4.7 THOU/uL (ref 1.6–6.1)
Platelets: 119 THOU/uL — ABNORMAL LOW (ref 160–370)
RBC: 2.8 MIL/uL — ABNORMAL LOW (ref 3.9–5.2)
RDW: 16.7 % — ABNORMAL HIGH (ref 11.7–14.4)
Seg Neut %: 55 % (ref 34.0–71.1)
WBC: 8.6 THOU/uL (ref 4.0–10.0)

## 2009-11-24 LAB — MISC. CELL %: Misc. Cell %: 0 % (ref 0–0)

## 2009-11-24 LAB — PHOSPHORUS: Phosphorus: 3.2 mg/dL (ref 2.7–4.5)

## 2009-11-24 LAB — GIANT PLATELETS

## 2009-11-24 LAB — DOHLE BODIES

## 2009-11-24 LAB — MANUAL DIFFERENTIAL

## 2009-11-24 LAB — MAGNESIUM: Magnesium: 1.7 meq/L (ref 1.3–2.1)

## 2009-11-25 ENCOUNTER — Ambulatory Visit: Payer: Self-pay | Admitting: Surgery

## 2009-11-25 LAB — PHOSPHORUS: Phosphorus: 4 mg/dL (ref 2.7–4.5)

## 2009-11-25 LAB — BASIC METABOLIC PANEL
Anion Gap: 11 (ref 7–16)
CO2: 28 mmol/L (ref 20–28)
Calcium: 8.8 mg/dL — ABNORMAL LOW (ref 9.0–10.4)
Chloride: 96 mmol/L (ref 96–108)
Creatinine: 0.43 mg/dL — ABNORMAL LOW (ref 0.51–0.95)
GFR,Black: >59 *
GFR,Caucasian: >59 *
Glucose: 133 mg/dL — ABNORMAL HIGH (ref 74–106)
Lab: 11 mg/dL (ref 6–20)
Potassium: 4.4 mmol/L (ref 3.3–5.1)
Sodium: 135 mmol/L (ref 133–145)

## 2009-11-25 LAB — GIANT PLATELETS

## 2009-11-25 LAB — MAGNESIUM: Magnesium: 1.7 meq/L (ref 1.3–2.1)

## 2009-11-26 LAB — CBC AND DIFFERENTIAL
Baso # K/uL: 0 THOU/uL (ref 0.0–0.1)
Baso # K/uL: 0 THOU/uL (ref 0.0–0.1)
Basophil %: 0 % (ref 0.1–1.2)
Basophil %: 0.1 % (ref 0.1–1.2)
Eos # K/uL: 0.1 THOU/uL (ref 0.0–0.4)
Eos # K/uL: 0.3 THOU/uL (ref 0.0–0.4)
Eosinophil %: 1 % (ref 0.7–5.8)
Eosinophil %: 2.4 % (ref 0.7–5.8)
Hematocrit: 24 % — ABNORMAL LOW (ref 34–45)
Hematocrit: 28 % — ABNORMAL LOW (ref 34–45)
Hemoglobin: 7.5 g/dL — ABNORMAL LOW (ref 11.2–15.7)
Hemoglobin: 8.9 g/dL — ABNORMAL LOW (ref 11.2–15.7)
Lymph # K/uL: 2.1 THOU/uL (ref 1.2–3.7)
Lymph # K/uL: 3.3 THOU/uL (ref 1.2–3.7)
Lymphocyte %: 18.2 % — ABNORMAL LOW (ref 19.3–51.7)
Lymphocyte %: 27 % (ref 19.3–51.7)
MCV: 90 fL (ref 79–95)
MCV: 92 fL (ref 79–95)
Mono # K/uL: 0.7 THOU/uL (ref 0.2–0.9)
Mono # K/uL: 0.7 THOU/uL (ref 0.2–0.9)
Monocyte %: 6 % (ref 4.7–12.5)
Monocyte %: 6.2 % (ref 4.7–12.5)
Neut # K/uL: 7.8 THOU/uL — ABNORMAL HIGH (ref 1.6–6.1)
Neut # K/uL: 8.4 THOU/uL — ABNORMAL HIGH (ref 1.6–6.1)
Nucl RBC %: 0 /100{WBCs} (ref 0.0–0.2)
Platelets: 151 THOU/uL — ABNORMAL LOW (ref 160–370)
Platelets: 153 THOU/uL — ABNORMAL LOW (ref 160–370)
RBC: 2.7 MIL/uL — ABNORMAL LOW (ref 3.9–5.2)
RBC: 3.1 MIL/uL — ABNORMAL LOW (ref 3.9–5.2)
RDW: 16.2 % — ABNORMAL HIGH (ref 11.7–14.4)
RDW: 16.3 % — ABNORMAL HIGH (ref 11.7–14.4)
Seg Neut %: 66 % (ref 34.0–71.1)
Seg Neut %: 73.1 % — ABNORMAL HIGH (ref 34.0–71.1)
WBC: 11.5 THOU/uL — ABNORMAL HIGH (ref 4.0–10.0)
WBC: 11.8 THOU/uL — ABNORMAL HIGH (ref 4.0–10.0)

## 2009-11-26 LAB — BASIC METABOLIC PANEL
Anion Gap: 4 — ABNORMAL LOW (ref 7–16)
CO2: 25 mmol/L (ref 20–28)
Calcium: 8.1 mg/dL — ABNORMAL LOW (ref 9.0–10.4)
Chloride: 105 mmol/L (ref 96–108)
Creatinine: 0.37 mg/dL — ABNORMAL LOW (ref 0.51–0.95)
GFR,Black: >59 *
GFR,Caucasian: >59 *
Glucose: 154 mg/dL — ABNORMAL HIGH (ref 74–106)
Lab: 8 mg/dL (ref 6–20)
Potassium: 4.1 mmol/L (ref 3.3–5.1)
Sodium: 134 mmol/L (ref 133–145)

## 2009-11-26 LAB — DIFF MANUAL: React Lymph %: 1 % (ref 0–6)

## 2009-11-26 LAB — PHOSPHORUS: Phosphorus: 2.8 mg/dL (ref 2.7–4.5)

## 2009-11-26 LAB — LEGIONELLA CULTURE

## 2009-11-26 LAB — MAGNESIUM: Magnesium: 1.7 meq/L (ref 1.3–2.1)

## 2009-11-26 LAB — HEMATOPATHOLOGY REVIEW
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
## 2009-11-27 ENCOUNTER — Encounter: Payer: Self-pay | Admitting: Cardiology

## 2009-11-27 ENCOUNTER — Other Ambulatory Visit: Payer: Self-pay | Admitting: Gastroenterology

## 2009-11-27 LAB — CBC AND DIFFERENTIAL
Baso # K/uL: 0 THOU/uL (ref 0.0–0.1)
Basophil %: 0.1 % (ref 0.1–1.2)
Eos # K/uL: 0.3 THOU/uL (ref 0.0–0.4)
Eosinophil %: 1.6 % (ref 0.7–5.8)
Hematocrit: 26 % — ABNORMAL LOW (ref 34–45)
Hemoglobin: 8.5 g/dL — ABNORMAL LOW (ref 11.2–15.7)
Lymph # K/uL: 2.6 THOU/uL (ref 1.2–3.7)
Lymphocyte %: 17.1 % — ABNORMAL LOW (ref 19.3–51.7)
MCV: 93 fL (ref 79–95)
Mono # K/uL: 1 THOU/uL — ABNORMAL HIGH (ref 0.2–0.9)
Monocyte %: 6.8 % (ref 4.7–12.5)
Neut # K/uL: 11.4 THOU/uL — ABNORMAL HIGH (ref 1.6–6.1)
Platelets: 201 THOU/uL (ref 160–370)
RBC: 2.8 MIL/uL — ABNORMAL LOW (ref 3.9–5.2)
RDW: 16.3 % — ABNORMAL HIGH (ref 11.7–14.4)
Seg Neut %: 74.4 % — ABNORMAL HIGH (ref 34.0–71.1)
WBC: 15.4 THOU/uL — ABNORMAL HIGH (ref 4.0–10.0)

## 2009-11-27 LAB — BASIC METABOLIC PANEL
Anion Gap: 13 (ref 7–16)
CO2: 21 mmol/L (ref 20–28)
Calcium: 8.7 mg/dL — ABNORMAL LOW (ref 9.0–10.4)
Chloride: 104 mmol/L (ref 96–108)
Creatinine: 0.39 mg/dL — ABNORMAL LOW (ref 0.51–0.95)
GFR,Black: >59 *
GFR,Caucasian: >59 *
Glucose: 147 mg/dL — ABNORMAL HIGH (ref 74–106)
Lab: 7 mg/dL (ref 6–20)
Potassium: 4 mmol/L (ref 3.3–5.1)
Sodium: 138 mmol/L (ref 133–145)

## 2009-11-27 LAB — LEGIONELLA CULTURE

## 2009-11-27 LAB — EKG 12-LEAD
P: 58 degrees
PR: 176 ms
QRS: 46 degrees
QRSD: 80 ms
QT: 324 ms
QTc: 443 ms
Rate: 112 {beats}/min
Severity: BORDERLINE
Statement: BORDERLINE
T: 46 degrees

## 2009-11-27 LAB — MAGNESIUM: Magnesium: 1.6 meq/L (ref 1.3–2.1)

## 2009-11-27 LAB — PHOSPHORUS: Phosphorus: 2.8 mg/dL (ref 2.7–4.5)

## 2009-11-28 LAB — CBC AND DIFFERENTIAL
Baso # K/uL: 0.1 THOU/uL (ref 0.0–0.1)
Basophil %: 0.8 % (ref 0.1–1.2)
Eos # K/uL: 0.1 THOU/uL (ref 0.0–0.4)
Eosinophil %: 1 % (ref 0.7–5.8)
Hematocrit: 24 % — ABNORMAL LOW (ref 34–45)
Hemoglobin: 7.5 g/dL — ABNORMAL LOW (ref 11.2–15.7)
Lymph # K/uL: 1.8 THOU/uL (ref 1.2–3.7)
Lymphocyte %: 16 % — ABNORMAL LOW (ref 19.3–51.7)
MCV: 90 fL (ref 79–95)
Mono # K/uL: 0.2 THOU/uL (ref 0.2–0.9)
Monocyte %: 2 % — ABNORMAL LOW (ref 4.7–12.5)
Neut # K/uL: 8.7 THOU/uL — ABNORMAL HIGH (ref 1.6–6.1)
Platelets: 182 THOU/uL (ref 160–370)
RBC: 2.6 MIL/uL — ABNORMAL LOW (ref 3.9–5.2)
RDW: 16.4 % — ABNORMAL HIGH (ref 11.7–14.4)
Seg Neut %: 80 % — ABNORMAL HIGH (ref 34.0–71.1)
WBC: 10.8 THOU/uL — ABNORMAL HIGH (ref 4.0–10.0)

## 2009-11-28 LAB — BASIC METABOLIC PANEL
Anion Gap: 7 (ref 7–16)
CO2: 27 mmol/L (ref 20–28)
Calcium: 8.2 mg/dL — ABNORMAL LOW (ref 9.0–10.4)
Chloride: 101 mmol/L (ref 96–108)
Creatinine: 0.36 mg/dL — ABNORMAL LOW (ref 0.51–0.95)
GFR,Black: >59 *
GFR,Caucasian: >59 *
Glucose: 135 mg/dL — ABNORMAL HIGH (ref 74–106)
Lab: 10 mg/dL (ref 6–20)
Potassium: 3.8 mmol/L (ref 3.3–5.1)
Sodium: 135 mmol/L (ref 133–145)

## 2009-11-28 LAB — EKG 12-LEAD
QRS: 57 degrees
QRSD: 72 ms
QT: 224 ms
QTc: 347 ms
Rate: 144 {beats}/min
Severity: ABNORMAL
Statement: BORDERLINE
T: -17 degrees

## 2009-11-28 LAB — MISC. CELL %: Misc. Cell %: 0 % (ref 0–0)

## 2009-11-28 LAB — MANUAL DIFFERENTIAL

## 2009-11-28 LAB — MAGNESIUM: Magnesium: 1.7 meq/L (ref 1.3–2.1)

## 2009-11-28 LAB — PHOSPHORUS: Phosphorus: 3.5 mg/dL (ref 2.7–4.5)

## 2009-11-28 LAB — GIANT PLATELETS

## 2009-11-28 LAB — REACTIVE LYMPHS: React Lymph %: 1 % (ref 0–6)

## 2009-11-29 LAB — CBC AND DIFFERENTIAL
Baso # K/uL: 0 THOU/uL (ref 0.0–0.1)
Basophil %: 0.3 % (ref 0.1–1.2)
Eos # K/uL: 0.2 THOU/uL (ref 0.0–0.4)
Eosinophil %: 2.3 % (ref 0.7–5.8)
Hematocrit: 24 % — ABNORMAL LOW (ref 34–45)
Hemoglobin: 7.8 g/dL — ABNORMAL LOW (ref 11.2–15.7)
Lymph # K/uL: 2.3 THOU/uL (ref 1.2–3.7)
Lymphocyte %: 23.8 % (ref 19.3–51.7)
MCV: 89 fL (ref 79–95)
Mono # K/uL: 1 THOU/uL — ABNORMAL HIGH (ref 0.2–0.9)
Monocyte %: 10.7 % (ref 4.7–12.5)
Neut # K/uL: 6.1 THOU/uL (ref 1.6–6.1)
Platelets: 214 THOU/uL (ref 160–370)
RBC: 2.7 MIL/uL — ABNORMAL LOW (ref 3.9–5.2)
RDW: 16.3 % — ABNORMAL HIGH (ref 11.7–14.4)
Seg Neut %: 62.9 % (ref 34.0–71.1)
WBC: 9.6 THOU/uL (ref 4.0–10.0)

## 2009-11-29 LAB — BASIC METABOLIC PANEL
Anion Gap: 9 (ref 7–16)
CO2: 29 mmol/L — ABNORMAL HIGH (ref 20–28)
Calcium: 8.9 mg/dL — ABNORMAL LOW (ref 9.0–10.4)
Chloride: 99 mmol/L (ref 96–108)
Creatinine: 0.39 mg/dL — ABNORMAL LOW (ref 0.51–0.95)
GFR,Black: >59 *
GFR,Caucasian: >59 *
Glucose: 126 mg/dL — ABNORMAL HIGH (ref 74–106)
Lab: 10 mg/dL (ref 6–20)
Potassium: 4 mmol/L (ref 3.3–5.1)
Sodium: 137 mmol/L (ref 133–145)

## 2009-11-29 LAB — MAGNESIUM: Magnesium: 1.7 meq/L (ref 1.3–2.1)

## 2009-11-29 LAB — PHOSPHORUS: Phosphorus: 3.7 mg/dL (ref 2.7–4.5)

## 2009-11-30 LAB — WHOLE BLOOD CALCIUM IONIZED
ICA @7.4,WB: 4.8 mg/dL (ref 4.8–5.2)
ICA Uncorr,WB: 4.7 mg/dL

## 2009-11-30 LAB — CBC AND DIFFERENTIAL
Baso # K/uL: 0.1 THOU/uL (ref 0.0–0.1)
Basophil %: 0.8 % (ref 0.1–1.2)
Eos # K/uL: 0.1 THOU/uL (ref 0.0–0.4)
Eosinophil %: 1 % (ref 0.7–5.8)
Hematocrit: 27 % — ABNORMAL LOW (ref 34–45)
Hemoglobin: 8.4 g/dL — ABNORMAL LOW (ref 11.2–15.7)
Lymph # K/uL: 2.2 THOU/uL (ref 1.2–3.7)
Lymphocyte %: 21 % (ref 19.3–51.7)
MCV: 89 fL (ref 79–95)
Mono # K/uL: 0.4 THOU/uL (ref 0.2–0.9)
Monocyte %: 4 % — ABNORMAL LOW (ref 4.7–12.5)
Neut # K/uL: 7 THOU/uL — ABNORMAL HIGH (ref 1.6–6.1)
Platelets: 226 THOU/uL (ref 160–370)
RBC: 3 MIL/uL — ABNORMAL LOW (ref 3.9–5.2)
RDW: 16.2 % — ABNORMAL HIGH (ref 11.7–14.4)
Seg Neut %: 71 % (ref 34.0–71.1)
WBC: 9.8 THOU/uL (ref 4.0–10.0)

## 2009-11-30 LAB — BASIC METABOLIC PANEL
Anion Gap: 11 (ref 7–16)
CO2: 27 mmol/L (ref 20–28)
Calcium: 9.3 mg/dL (ref 9.0–10.4)
Chloride: 99 mmol/L (ref 96–108)
Creatinine: 0.36 mg/dL — ABNORMAL LOW (ref 0.51–0.95)
GFR,Black: >59 *
GFR,Caucasian: >59 *
Glucose: 129 mg/dL — ABNORMAL HIGH (ref 74–106)
Lab: 13 mg/dL (ref 6–20)
Potassium: 3.9 mmol/L (ref 3.3–5.1)
Sodium: 137 mmol/L (ref 133–145)

## 2009-11-30 LAB — BANDS: Bands %: 1 % (ref 0–10)

## 2009-11-30 LAB — REACTIVE LYMPHS: React Lymph %: 2 % (ref 0–6)

## 2009-11-30 LAB — MAGNESIUM: Magnesium: 1.8 meq/L (ref 1.3–2.1)

## 2009-11-30 LAB — MANUAL DIFFERENTIAL

## 2009-11-30 LAB — GIANT PLATELETS

## 2009-11-30 LAB — MISC. CELL %: Misc. Cell %: 0 % (ref 0–0)

## 2009-11-30 LAB — PHOSPHORUS: Phosphorus: 4.4 mg/dL (ref 2.7–4.5)

## 2009-12-01 LAB — CBC AND DIFFERENTIAL
Baso # K/uL: 0 THOU/uL (ref 0.0–0.1)
Basophil %: 0 % (ref 0.1–1.2)
Eos # K/uL: 0.2 THOU/uL (ref 0.0–0.4)
Eosinophil %: 2 % (ref 0.7–5.8)
Hematocrit: 27 % — ABNORMAL LOW (ref 34–45)
Hemoglobin: 8.5 g/dL — ABNORMAL LOW (ref 11.2–15.7)
Lymph # K/uL: 2.4 THOU/uL (ref 1.2–3.7)
Lymphocyte %: 26 % (ref 19.3–51.7)
MCV: 89 fL (ref 79–95)
Mono # K/uL: 0.2 THOU/uL (ref 0.2–0.9)
Monocyte %: 3 % — ABNORMAL LOW (ref 4.7–12.5)
Neut # K/uL: 4.8 THOU/uL (ref 1.6–6.1)
Platelets: 241 THOU/uL (ref 160–370)
RBC: 3 MIL/uL — ABNORMAL LOW (ref 3.9–5.2)
RDW: 16.2 % — ABNORMAL HIGH (ref 11.7–14.4)
Seg Neut %: 63 % (ref 34.0–71.1)
WBC: 7.5 THOU/uL (ref 4.0–10.0)

## 2009-12-01 LAB — BASIC METABOLIC PANEL
Anion Gap: 9 (ref 7–16)
CO2: 28 mmol/L (ref 20–28)
Calcium: 9 mg/dL (ref 9.0–10.4)
Chloride: 99 mmol/L (ref 96–108)
Creatinine: 0.34 mg/dL — ABNORMAL LOW (ref 0.51–0.95)
GFR,Black: >59 *
GFR,Caucasian: >59 *
Glucose: 117 mg/dL — ABNORMAL HIGH (ref 74–106)
Lab: 15 mg/dL (ref 6–20)
Potassium: 4.1 mmol/L (ref 3.3–5.1)
Sodium: 136 mmol/L (ref 133–145)

## 2009-12-01 LAB — BANDS: Bands %: 1 % (ref 0–10)

## 2009-12-01 LAB — REACTIVE LYMPHS: React Lymph %: 6 % (ref 0–6)

## 2009-12-01 LAB — PHOSPHORUS: Phosphorus: 4.2 mg/dL (ref 2.7–4.5)

## 2009-12-01 LAB — MAGNESIUM: Magnesium: 1.8 meq/L (ref 1.3–2.1)

## 2009-12-01 LAB — WHOLE BLOOD CALCIUM IONIZED
ICA @7.4,WB: 5 mg/dL (ref 4.8–5.2)
ICA Uncorr,WB: 4.8 mg/dL

## 2009-12-01 LAB — GIANT PLATELETS

## 2009-12-01 LAB — MANUAL DIFFERENTIAL

## 2009-12-01 LAB — MISC. CELL %: Misc. Cell %: 0 % (ref 0–0)

## 2009-12-02 LAB — CBC AND DIFFERENTIAL
Baso # K/uL: 0.1 THOU/uL (ref 0.0–0.1)
Basophil %: 0.8 % (ref 0.1–1.2)
Eos # K/uL: 0.4 THOU/uL (ref 0.0–0.4)
Eosinophil %: 4 % (ref 0.7–5.8)
Hematocrit: 27 % — ABNORMAL LOW (ref 34–45)
Hemoglobin: 8.7 g/dL — ABNORMAL LOW (ref 11.2–15.7)
Lymph # K/uL: 2.5 THOU/uL (ref 1.2–3.7)
Lymphocyte %: 28 % (ref 19.3–51.7)
MCV: 88 fL (ref 79–95)
Mono # K/uL: 0.9 THOU/uL (ref 0.2–0.9)
Monocyte %: 10 % (ref 4.7–12.5)
Neut # K/uL: 5.1 THOU/uL (ref 1.6–6.1)
Platelets: 252 THOU/uL (ref 160–370)
RBC: 3.1 MIL/uL — ABNORMAL LOW (ref 3.9–5.2)
RDW: 16.3 % — ABNORMAL HIGH (ref 11.7–14.4)
Seg Neut %: 56 % (ref 34.0–71.1)
WBC: 9 THOU/uL (ref 4.0–10.0)

## 2009-12-02 LAB — BASIC METABOLIC PANEL
Anion Gap: 12 (ref 7–16)
CO2: 26 mmol/L (ref 20–28)
Calcium: 9.3 mg/dL (ref 9.0–10.4)
Chloride: 98 mmol/L (ref 96–108)
Creatinine: 0.46 mg/dL — ABNORMAL LOW (ref 0.51–0.95)
GFR,Black: >59 *
GFR,Caucasian: >59 *
Glucose: 92 mg/dL (ref 74–106)
Lab: 16 mg/dL (ref 6–20)
Potassium: 4.1 mmol/L (ref 3.3–5.1)
Sodium: 136 mmol/L (ref 133–145)

## 2009-12-02 LAB — COMPREHENSIVE METABOLIC PANEL
ALT: 92 U/L — ABNORMAL HIGH (ref 0–35)
AST: 28 U/L (ref 0–35)
Albumin: 3.6 g/dL (ref 3.5–5.2)
Alk Phos: 84 U/L (ref 35–105)
Anion Gap: 12 (ref 7–16)
Bilirubin,Total: 0.3 mg/dL (ref 0.0–1.2)
CO2: 26 mmol/L (ref 20–28)
Calcium: 8.8 mg/dL — ABNORMAL LOW (ref 9.0–10.4)
Chloride: 98 mmol/L (ref 96–108)
Creatinine: 0.48 mg/dL — ABNORMAL LOW (ref 0.51–0.95)
GFR,Black: >59 *
GFR,Caucasian: >59 *
Glucose: 99 mg/dL (ref 74–106)
Lab: 18 mg/dL (ref 6–20)
Potassium: 3.8 mmol/L (ref 3.3–5.1)
Sodium: 136 mmol/L (ref 133–145)
Total Protein: 7.9 g/dL — ABNORMAL HIGH (ref 6.3–7.7)

## 2009-12-02 LAB — TOXIC GRAN

## 2009-12-02 LAB — DOHLE BODIES

## 2009-12-02 LAB — METAMYELOCYTE: Metamyelocyte %: 1 % (ref 0–1)

## 2009-12-02 LAB — MANUAL DIFFERENTIAL

## 2009-12-02 LAB — MISC. CELL %: Misc. Cell %: 0 % (ref 0–0)

## 2009-12-02 LAB — GIANT PLATELETS

## 2009-12-03 LAB — BASIC METABOLIC PANEL
Anion Gap: 13 (ref 7–16)
CO2: 26 mmol/L (ref 20–28)
Calcium: 9.2 mg/dL (ref 9.0–10.4)
Chloride: 99 mmol/L (ref 96–108)
Creatinine: 0.45 mg/dL — ABNORMAL LOW (ref 0.51–0.95)
GFR,Black: >59 *
GFR,Caucasian: >59 *
Glucose: 106 mg/dL (ref 74–106)
Lab: 19 mg/dL (ref 6–20)
Potassium: 4.2 mmol/L (ref 3.3–5.1)
Sodium: 138 mmol/L (ref 133–145)

## 2009-12-03 LAB — CBC AND DIFFERENTIAL
Baso # K/uL: 0.1 THOU/uL (ref 0.0–0.1)
Basophil %: 0.4 % (ref 0.1–1.2)
Eos # K/uL: 0.2 THOU/uL (ref 0.0–0.4)
Eosinophil %: 2 % (ref 0.7–5.8)
Hematocrit: 30 % — ABNORMAL LOW (ref 34–45)
Hemoglobin: 9.3 g/dL — ABNORMAL LOW (ref 11.2–15.7)
Lymph # K/uL: 3.2 THOU/uL (ref 1.2–3.7)
Lymphocyte %: 28.4 % (ref 19.3–51.7)
MCV: 87 fL (ref 79–95)
Mono # K/uL: 0.9 THOU/uL (ref 0.2–0.9)
Monocyte %: 8.2 % (ref 4.7–12.5)
Neut # K/uL: 6.9 THOU/uL — ABNORMAL HIGH (ref 1.6–6.1)
Platelets: 272 THOU/uL (ref 160–370)
RBC: 3.4 MIL/uL — ABNORMAL LOW (ref 3.9–5.2)
RDW: 16.4 % — ABNORMAL HIGH (ref 11.7–14.4)
Seg Neut %: 61 % (ref 34.0–71.1)
WBC: 11.4 THOU/uL — ABNORMAL HIGH (ref 4.0–10.0)

## 2009-12-03 LAB — APTT: aPTT: 26.3 s (ref 22.3–35.3)

## 2009-12-03 LAB — PROTIME-INR
INR: 1.2 — ABNORMAL HIGH (ref 0.9–1.1)
Protime: 15.3 s — ABNORMAL HIGH (ref 11.9–14.7)

## 2009-12-03 NOTE — Op Note (Signed)
 SURGEON:  Mal Amabile, MD,PhD  CO-SURGEON:  ASSISTANT:  Donne Hazel, MD,RES  SURGERY DATE:  12/03/2009    PREOPERATIVE DIAGNOSIS:   Respiratory failure.    POSTOPERATIVE DIAGNOSIS:  Respiratory failure.    OPERATIVE PROCEDURE:      Open tracheostomy.    ANESTHESIA: Propofol and vecuronium.    ESTIMATED BLOOD LOSS:     10 cc.    COMPLICATIONS:     None.    HISTORY OF PRESENT ILLNESS:        This is a 23 year old female with a                                     history of quadriplegia who underwent                                     open tracheostomy for respiratory                                     failure.  This is actually a repeat                                     tracheostomy.  She had a tracheostomy                                     back in 2006 for a blunt head trauma                                     that she unfortunately required                                     tracheostomy.    DESCRIPTION OF PROCEDURE:              This was a bedside tracheostomy.  Appropriate sedation was given with propofol and vecuronium for a  paralytic.  After surgical timeout, the head was positioned appropriately  and she was placed on a shoulder roll.  Next, 10 cc of lidocaine 1% with  epinephrine was injected into the planned incision site.  The patient was  prepped and draped in a sterile fashion.  A 3-cm horizontal incision was  made roughly 2 fingerbreadths above the sternal notch.  The skin was  excised, which was overlying keloid in the area of the old tracheostomy  site.  We used blunt and sharp dissection to get down to the strap muscles.  The strap muscles were separated laterally after the median raphe was  found.  There was significant scarring all the way down to the anterior  wall of the trachea.  With the use of blunt and sharp dissection, we cut  down to the anterior wall of the trachea and part of the thyroid gland was  identified.  The thyroid gland was retracted superiorly out of the  area  where we were going to make our entry into the anterior  wall of the  trachea.  An incision was created between the 2nd and 3rd tracheal ring.  A  trap door was created.  The ET tube was slowly withdrawn until the distal  end of the endotracheal tube was identified.  A #6 cuffed Shiley  tracheostomy was placed without difficulty, and adequate tidal volumes were  obtained once the cuff was inflated.  The trach was secured with #2-0 silk  stitch and a soft velcro tie.  The patient tolerated the procedure well and  there were no complications.    Dr. Neil Crouch was present and active throughout the entire case.    Dictated by:  Donne Hazel, MD,RES    I was present throughout the entire procedure.    Electronically Signed and Finalized  by  Mal Amabile, MD,PhD 12/06/2009  11:13  _____________________________________________  Mal Amabile, MD,PhD      DD:   12/03/2009  DT:   12/03/2009  9:20 A  DVI:  [id number]  BO/RA#[id number]    cc:   Mal Amabile, MD,PhD

## 2009-12-04 ENCOUNTER — Ambulatory Visit: Payer: Self-pay | Admitting: Physical Medicine and Rehabilitation

## 2009-12-04 LAB — BASIC METABOLIC PANEL
Anion Gap: 13 (ref 7–16)
CO2: 27 mmol/L (ref 20–28)
Calcium: 9.1 mg/dL (ref 9.0–10.4)
Chloride: 99 mmol/L (ref 96–108)
Creatinine: 0.51 mg/dL (ref 0.51–0.95)
GFR,Black: >59 *
GFR,Caucasian: >59 *
Glucose: 131 mg/dL — ABNORMAL HIGH (ref 74–106)
Lab: 20 mg/dL (ref 6–20)
Potassium: 3.7 mmol/L (ref 3.3–5.1)
Sodium: 139 mmol/L (ref 133–145)

## 2009-12-04 LAB — CBC AND DIFFERENTIAL
Baso # K/uL: 0 THOU/uL (ref 0.0–0.1)
Basophil %: 0.2 % (ref 0.1–1.2)
Eos # K/uL: 0 THOU/uL (ref 0.0–0.4)
Eosinophil %: 0.2 % — ABNORMAL LOW (ref 0.7–5.8)
Hematocrit: 29 % — ABNORMAL LOW (ref 34–45)
Hemoglobin: 9.3 g/dL — ABNORMAL LOW (ref 11.2–15.7)
Lymph # K/uL: 2.5 THOU/uL (ref 1.2–3.7)
Lymphocyte %: 20 % (ref 19.3–51.7)
MCV: 88 fL (ref 79–95)
Mono # K/uL: 1 THOU/uL — ABNORMAL HIGH (ref 0.2–0.9)
Monocyte %: 8.1 % (ref 4.7–12.5)
Neut # K/uL: 8.8 THOU/uL — ABNORMAL HIGH (ref 1.6–6.1)
Platelets: 242 THOU/uL (ref 160–370)
RBC: 3.3 MIL/uL — ABNORMAL LOW (ref 3.9–5.2)
RDW: 16.6 % — ABNORMAL HIGH (ref 11.7–14.4)
Seg Neut %: 71.5 % — ABNORMAL HIGH (ref 34.0–71.1)
WBC: 12.3 THOU/uL — ABNORMAL HIGH (ref 4.0–10.0)

## 2009-12-06 LAB — BASIC METABOLIC PANEL
Anion Gap: 11 (ref 7–16)
CO2: 30 mmol/L — ABNORMAL HIGH (ref 20–28)
Calcium: 9.3 mg/dL (ref 9.0–10.4)
Chloride: 99 mmol/L (ref 96–108)
Creatinine: 0.44 mg/dL — ABNORMAL LOW (ref 0.51–0.95)
GFR,Black: >59 *
GFR,Caucasian: >59 *
Glucose: 121 mg/dL — ABNORMAL HIGH (ref 74–106)
Lab: 21 mg/dL — ABNORMAL HIGH (ref 6–20)
Potassium: 3.6 mmol/L (ref 3.3–5.1)
Sodium: 140 mmol/L (ref 133–145)

## 2009-12-06 LAB — CBC AND DIFFERENTIAL
Baso # K/uL: 0 THOU/uL (ref 0.0–0.1)
Basophil %: 0.2 % (ref 0.1–1.2)
Eos # K/uL: 0.3 THOU/uL (ref 0.0–0.4)
Eosinophil %: 2.7 % (ref 0.7–5.8)
Hematocrit: 30 % — ABNORMAL LOW (ref 34–45)
Hemoglobin: 9.4 g/dL — ABNORMAL LOW (ref 11.2–15.7)
Lymph # K/uL: 2.5 THOU/uL (ref 1.2–3.7)
Lymphocyte %: 23.6 % (ref 19.3–51.7)
MCV: 89 fL (ref 79–95)
Mono # K/uL: 0.9 THOU/uL (ref 0.2–0.9)
Monocyte %: 8.2 % (ref 4.7–12.5)
Neut # K/uL: 6.8 THOU/uL — ABNORMAL HIGH (ref 1.6–6.1)
Platelets: 186 THOU/uL (ref 160–370)
RBC: 3.4 MIL/uL — ABNORMAL LOW (ref 3.9–5.2)
RDW: 16.8 % — ABNORMAL HIGH (ref 11.7–14.4)
Seg Neut %: 65.3 % (ref 34.0–71.1)
WBC: 10.4 THOU/uL — ABNORMAL HIGH (ref 4.0–10.0)

## 2009-12-06 LAB — MAGNESIUM: Magnesium: 1.9 meq/L (ref 1.3–2.1)

## 2009-12-06 LAB — PHOSPHORUS: Phosphorus: 3.9 mg/dL (ref 2.7–4.5)

## 2009-12-07 LAB — CBC AND DIFFERENTIAL
Baso # K/uL: 0 THOU/uL (ref 0.0–0.1)
Basophil %: 0.2 % (ref 0.1–1.2)
Eos # K/uL: 0.3 THOU/uL (ref 0.0–0.4)
Eosinophil %: 2.6 % (ref 0.7–5.8)
Hematocrit: 31 % — ABNORMAL LOW (ref 34–45)
Hemoglobin: 9.5 g/dL — ABNORMAL LOW (ref 11.2–15.7)
Lymph # K/uL: 2.5 THOU/uL (ref 1.2–3.7)
Lymphocyte %: 23.8 % (ref 19.3–51.7)
MCV: 89 fL (ref 79–95)
Mono # K/uL: 1 THOU/uL — ABNORMAL HIGH (ref 0.2–0.9)
Monocyte %: 9.6 % (ref 4.7–12.5)
Neut # K/uL: 6.8 THOU/uL — ABNORMAL HIGH (ref 1.6–6.1)
Platelets: 182 THOU/uL (ref 160–370)
RBC: 3.5 MIL/uL — ABNORMAL LOW (ref 3.9–5.2)
RDW: 16.6 % — ABNORMAL HIGH (ref 11.7–14.4)
Seg Neut %: 63.8 % (ref 34.0–71.1)
WBC: 10.7 THOU/uL — ABNORMAL HIGH (ref 4.0–10.0)

## 2009-12-07 LAB — BASIC METABOLIC PANEL
Anion Gap: 13 (ref 7–16)
CO2: 30 mmol/L — ABNORMAL HIGH (ref 20–28)
Calcium: 9.1 mg/dL (ref 9.0–10.4)
Chloride: 97 mmol/L (ref 96–108)
Creatinine: 0.48 mg/dL — ABNORMAL LOW (ref 0.51–0.95)
GFR,Black: >59 *
GFR,Caucasian: >59 *
Glucose: 123 mg/dL — ABNORMAL HIGH (ref 74–106)
Lab: 24 mg/dL — ABNORMAL HIGH (ref 6–20)
Potassium: 4.1 mmol/L (ref 3.3–5.1)
Sodium: 140 mmol/L (ref 133–145)

## 2009-12-07 LAB — MAGNESIUM: Magnesium: 2 meq/L (ref 1.3–2.1)
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
## 2009-12-08 LAB — BASIC METABOLIC PANEL
Anion Gap: 10 (ref 7–16)
CO2: 31 mmol/L — ABNORMAL HIGH (ref 20–28)
Calcium: 9.5 mg/dL (ref 9.0–10.4)
Chloride: 97 mmol/L (ref 96–108)
Creatinine: 0.44 mg/dL — ABNORMAL LOW (ref 0.51–0.95)
GFR,Black: >59 *
GFR,Caucasian: >59 *
Glucose: 117 mg/dL — ABNORMAL HIGH (ref 74–106)
Lab: 18 mg/dL (ref 6–20)
Potassium: 4.4 mmol/L (ref 3.3–5.1)
Sodium: 138 mmol/L (ref 133–145)

## 2009-12-08 LAB — MAGNESIUM: Magnesium: 1.8 meq/L (ref 1.3–2.1)

## 2009-12-09 LAB — URINALYSIS WITH REFLEX TO MICROSCOPIC
Blood,UA: NEGATIVE
Ketones, UA: NEGATIVE
Nitrite,UA: NEGATIVE
Protein,UA: 100 mg/dL — AB
Specific Gravity,UA: 1.021 (ref 1.002–1.030)
pH,UA: 8 (ref 5.0–8.0)

## 2009-12-09 LAB — CBC AND DIFFERENTIAL
Baso # K/uL: 0 THOU/uL (ref 0.0–0.1)
Basophil %: 0.1 % (ref 0.1–1.2)
Eos # K/uL: 0.2 THOU/uL (ref 0.0–0.4)
Eosinophil %: 1 % (ref 0.7–5.8)
Hematocrit: 32 % — ABNORMAL LOW (ref 34–45)
Hemoglobin: 10.1 g/dL — ABNORMAL LOW (ref 11.2–15.7)
Lymph # K/uL: 2.8 THOU/uL (ref 1.2–3.7)
Lymphocyte %: 17.1 % — ABNORMAL LOW (ref 19.3–51.7)
MCV: 86 fL (ref 79–95)
Mono # K/uL: 1.1 THOU/uL — ABNORMAL HIGH (ref 0.2–0.9)
Monocyte %: 6.6 % (ref 4.7–12.5)
Neut # K/uL: 12.4 THOU/uL — ABNORMAL HIGH (ref 1.6–6.1)
Platelets: 146 THOU/uL — ABNORMAL LOW (ref 160–370)
RBC: 3.7 MIL/uL — ABNORMAL LOW (ref 3.9–5.2)
RDW: 16.3 % — ABNORMAL HIGH (ref 11.7–14.4)
Seg Neut %: 75.2 % — ABNORMAL HIGH (ref 34.0–71.1)
WBC: 16.6 THOU/uL — ABNORMAL HIGH (ref 4.0–10.0)

## 2009-12-09 LAB — URINE MICROSCOPIC (IQ200)
RBC,UA: 16 /HPF — ABNORMAL HIGH (ref 0–2)
WBC,UA: 11 /HPF — AB (ref 0–5)

## 2009-12-09 LAB — BASIC METABOLIC PANEL
Anion Gap: 14 (ref 7–16)
CO2: 27 mmol/L (ref 20–28)
Calcium: 9.4 mg/dL (ref 9.0–10.4)
Chloride: 95 mmol/L — ABNORMAL LOW (ref 96–108)
Creatinine: 0.49 mg/dL — ABNORMAL LOW (ref 0.51–0.95)
GFR,Black: >59 *
GFR,Caucasian: >59 *
Glucose: 135 mg/dL — ABNORMAL HIGH (ref 74–106)
Lab: 16 mg/dL (ref 6–20)
Potassium: 3.9 mmol/L (ref 3.3–5.1)
Sodium: 136 mmol/L (ref 133–145)

## 2009-12-09 LAB — GRAM STAIN: Gram Stain: <=10

## 2009-12-09 LAB — MAGNESIUM: Magnesium: 1.7 meq/L (ref 1.3–2.1)

## 2009-12-10 ENCOUNTER — Encounter: Payer: Self-pay | Admitting: Cardiology

## 2009-12-10 ENCOUNTER — Other Ambulatory Visit: Payer: Self-pay | Admitting: Gastroenterology

## 2009-12-10 LAB — CBC AND DIFFERENTIAL
Baso # K/uL: 0 THOU/uL (ref 0.0–0.1)
Basophil %: 0.2 % (ref 0.1–1.2)
Eos # K/uL: 0.1 THOU/uL (ref 0.0–0.4)
Eosinophil %: 0.4 % — ABNORMAL LOW (ref 0.7–5.8)
Hematocrit: 27 % — ABNORMAL LOW (ref 34–45)
Hemoglobin: 8.4 g/dL — ABNORMAL LOW (ref 11.2–15.7)
Lymph # K/uL: 2.3 THOU/uL (ref 1.2–3.7)
Lymphocyte %: 19 % — ABNORMAL LOW (ref 19.3–51.7)
MCV: 88 fL (ref 79–95)
Mono # K/uL: 1.4 THOU/uL — ABNORMAL HIGH (ref 0.2–0.9)
Monocyte %: 11.4 % (ref 4.7–12.5)
Neut # K/uL: 8.2 THOU/uL — ABNORMAL HIGH (ref 1.6–6.1)
Platelets: 107 THOU/uL — ABNORMAL LOW (ref 160–370)
RBC: 3.1 MIL/uL — ABNORMAL LOW (ref 3.9–5.2)
RDW: 16.5 % — ABNORMAL HIGH (ref 11.7–14.4)
Seg Neut %: 69 % (ref 34.0–71.1)
WBC: 11.9 THOU/uL — ABNORMAL HIGH (ref 4.0–10.0)

## 2009-12-10 LAB — BASIC METABOLIC PANEL
Anion Gap: 13 (ref 7–16)
CO2: 26 mmol/L (ref 20–28)
Calcium: 8.9 mg/dL — ABNORMAL LOW (ref 9.0–10.4)
Chloride: 97 mmol/L (ref 96–108)
Creatinine: 0.38 mg/dL — ABNORMAL LOW (ref 0.51–0.95)
GFR,Black: >59 *
GFR,Caucasian: >59 *
Glucose: 149 mg/dL — ABNORMAL HIGH (ref 74–106)
Lab: 9 mg/dL (ref 6–20)
Potassium: 3.7 mmol/L (ref 3.3–5.1)
Sodium: 136 mmol/L (ref 133–145)

## 2009-12-10 LAB — HEPATIC FUNCTION PANEL
ALT: 41 U/L — ABNORMAL HIGH (ref 0–35)
AST: 24 U/L (ref 0–35)
Albumin: 3.3 g/dL — ABNORMAL LOW (ref 3.5–5.2)
Alk Phos: 64 U/L (ref 35–105)
Bilirubin,Direct: <0.2 mg/dL (ref 0.0–0.3)
Bilirubin,Total: 0.3 mg/dL (ref 0.0–1.2)
Total Protein: 7.2 g/dL (ref 6.3–7.7)

## 2009-12-10 LAB — GRAM STAIN: Gram Stain: 0

## 2009-12-10 LAB — TOBRAMYCIN LEVEL, PEAK: Tobramycin Peak: 9.7 ug/mL (ref 5.0–10.0)

## 2009-12-10 LAB — PREALBUMIN: Prealbumin: 18 mg/dL — ABNORMAL LOW (ref 20–40)

## 2009-12-10 LAB — MAGNESIUM: Magnesium: 1.7 meq/L (ref 1.3–2.1)

## 2009-12-10 LAB — TOBRAMYCIN LEVEL, TROUGH: Tobramycin Trough: 0.1 ug/mL — ABNORMAL LOW (ref 1.0–2.0)

## 2009-12-11 LAB — CBC AND DIFFERENTIAL
Baso # K/uL: 0 THOU/uL (ref 0.0–0.1)
Basophil %: 0.3 % (ref 0.1–1.2)
Eos # K/uL: 0.3 THOU/uL (ref 0.0–0.4)
Eosinophil %: 3 % (ref 0.7–5.8)
Hematocrit: 27 % — ABNORMAL LOW (ref 34–45)
Hemoglobin: 8.3 g/dL — ABNORMAL LOW (ref 11.2–15.7)
Lymph # K/uL: 2.3 THOU/uL (ref 1.2–3.7)
Lymphocyte %: 22.4 % (ref 19.3–51.7)
MCV: 87 fL (ref 79–95)
Mono # K/uL: 1 THOU/uL — ABNORMAL HIGH (ref 0.2–0.9)
Monocyte %: 10.2 % (ref 4.7–12.5)
Neut # K/uL: 6.5 THOU/uL — ABNORMAL HIGH (ref 1.6–6.1)
Platelets: 106 THOU/uL — ABNORMAL LOW (ref 160–370)
RBC: 3.1 MIL/uL — ABNORMAL LOW (ref 3.9–5.2)
RDW: 16.5 % — ABNORMAL HIGH (ref 11.7–14.4)
Seg Neut %: 64.1 % (ref 34.0–71.1)
WBC: 10.2 THOU/uL — ABNORMAL HIGH (ref 4.0–10.0)

## 2009-12-11 LAB — BASIC METABOLIC PANEL
Anion Gap: 10 (ref 7–16)
CO2: 27 mmol/L (ref 20–28)
Calcium: 9.3 mg/dL (ref 9.0–10.4)
Chloride: 98 mmol/L (ref 96–108)
Creatinine: 0.37 mg/dL — ABNORMAL LOW (ref 0.51–0.95)
GFR,Black: >59 *
GFR,Caucasian: >59 *
Glucose: 105 mg/dL (ref 74–106)
Lab: 14 mg/dL (ref 6–20)
Potassium: 3.8 mmol/L (ref 3.3–5.1)
Sodium: 135 mmol/L (ref 133–145)

## 2009-12-11 LAB — LEGIONELLA CULTURE: Legionella Culture: UNDETERMINED

## 2009-12-11 LAB — VANCOMYCIN, TROUGH: Vancomycin Trough: 34.4 ug/mL — ABNORMAL HIGH (ref 10.0–20.0)

## 2009-12-11 LAB — AEROBIC CULTURE: Aerobic Culture: NORMAL

## 2009-12-11 LAB — MAGNESIUM: Magnesium: 1.8 meq/L (ref 1.3–2.1)

## 2009-12-11 LAB — PHOSPHORUS: Phosphorus: 3.6 mg/dL (ref 2.7–4.5)

## 2009-12-12 LAB — BASIC METABOLIC PANEL
Anion Gap: 8 (ref 7–16)
CO2: 28 mmol/L (ref 20–28)
Calcium: 8.9 mg/dL — ABNORMAL LOW (ref 9.0–10.4)
Chloride: 100 mmol/L (ref 96–108)
Creatinine: 0.45 mg/dL — ABNORMAL LOW (ref 0.51–0.95)
GFR,Black: >59 *
GFR,Caucasian: >59 *
Glucose: 92 mg/dL (ref 74–106)
Lab: 18 mg/dL (ref 6–20)
Potassium: 4.2 mmol/L (ref 3.3–5.1)
Sodium: 136 mmol/L (ref 133–145)

## 2009-12-12 LAB — CBC AND DIFFERENTIAL
Baso # K/uL: 0 THOU/uL (ref 0.0–0.1)
Basophil %: 0 % (ref 0.1–1.2)
Eos # K/uL: 0.3 THOU/uL (ref 0.0–0.4)
Eosinophil %: 3 % (ref 0.7–5.8)
Hematocrit: 27 % — ABNORMAL LOW (ref 34–45)
Hemoglobin: 8.3 g/dL — ABNORMAL LOW (ref 11.2–15.7)
Lymph # K/uL: 2.9 THOU/uL (ref 1.2–3.7)
Lymphocyte %: 27 % (ref 19.3–51.7)
MCV: 87 fL (ref 79–95)
Mono # K/uL: 0.7 THOU/uL (ref 0.2–0.9)
Monocyte %: 8 % (ref 4.7–12.5)
Neut # K/uL: 5.3 THOU/uL (ref 1.6–6.1)
Platelets: 117 THOU/uL — ABNORMAL LOW (ref 160–370)
RBC: 3.1 MIL/uL — ABNORMAL LOW (ref 3.9–5.2)
RDW: 16.7 % — ABNORMAL HIGH (ref 11.7–14.4)
Seg Neut %: 54 % (ref 34.0–71.1)
WBC: 9.2 THOU/uL (ref 4.0–10.0)

## 2009-12-12 LAB — PHOSPHORUS: Phosphorus: 4.1 mg/dL (ref 2.7–4.5)

## 2009-12-12 LAB — REACTIVE LYMPHS: React Lymph %: 5 % (ref 0–6)

## 2009-12-12 LAB — BANDS: Bands %: 4 % (ref 0–10)

## 2009-12-12 LAB — MISC. CELL %: Misc. Cell %: 0 % (ref 0–0)

## 2009-12-12 LAB — MAGNESIUM: Magnesium: 1.8 meq/L (ref 1.3–2.1)

## 2009-12-12 LAB — AEROBIC CULTURE

## 2009-12-12 LAB — MANUAL DIFFERENTIAL

## 2009-12-12 LAB — GIANT PLATELETS

## 2009-12-12 LAB — VANCOMYCIN, RANDOM: Vancomycin Level: 3.3 ug/mL

## 2009-12-12 LAB — ZINC: Zinc: 57 ug/dL — ABNORMAL LOW (ref 60–120)

## 2009-12-13 LAB — BASIC METABOLIC PANEL
Anion Gap: 9 (ref 7–16)
CO2: 28 mmol/L (ref 20–28)
Calcium: 8.9 mg/dL — ABNORMAL LOW (ref 9.0–10.4)
Chloride: 99 mmol/L (ref 96–108)
Creatinine: 0.46 mg/dL — ABNORMAL LOW (ref 0.51–0.95)
GFR,Black: >59 *
GFR,Caucasian: >59 *
Glucose: 107 mg/dL — ABNORMAL HIGH (ref 74–106)
Lab: 17 mg/dL (ref 6–20)
Potassium: 4.1 mmol/L (ref 3.3–5.1)
Sodium: 136 mmol/L (ref 133–145)

## 2009-12-13 LAB — CBC AND DIFFERENTIAL
Baso # K/uL: 0.2 THOU/uL — ABNORMAL HIGH (ref 0.0–0.1)
Basophil %: 1.7 % — ABNORMAL HIGH (ref 0.1–1.2)
Eos # K/uL: 0.4 THOU/uL (ref 0.0–0.4)
Eosinophil %: 4 % (ref 0.7–5.8)
Hematocrit: 30 % — ABNORMAL LOW (ref 34–45)
Hemoglobin: 9.1 g/dL — ABNORMAL LOW (ref 11.2–15.7)
Lymph # K/uL: 4 THOU/uL — ABNORMAL HIGH (ref 1.2–3.7)
Lymphocyte %: 41 % (ref 19.3–51.7)
MCV: 87 fL (ref 79–95)
Mono # K/uL: 0.8 THOU/uL (ref 0.2–0.9)
Monocyte %: 9 % (ref 4.7–12.5)
Neut # K/uL: 3.8 THOU/uL (ref 1.6–6.1)
Platelets: 126 THOU/uL — ABNORMAL LOW (ref 160–370)
RBC: 3.4 MIL/uL — ABNORMAL LOW (ref 3.9–5.2)
RDW: 16.8 % — ABNORMAL HIGH (ref 11.7–14.4)
Seg Neut %: 41 % (ref 34.0–71.1)
WBC: 9.1 THOU/uL (ref 4.0–10.0)

## 2009-12-13 LAB — EKG 12-LEAD
QRS: 72 degrees
QRSD: 78 ms
QT: 316 ms
QTc: 445 ms
Rate: 119 {beats}/min
Severity: ABNORMAL
Statement: BORDERLINE
T: 60 degrees

## 2009-12-13 LAB — MISC. CELL %: Misc. Cell %: 0 % (ref 0–0)

## 2009-12-13 LAB — GIANT PLATELETS

## 2009-12-13 LAB — REACTIVE LYMPHS: React Lymph %: 3 % (ref 0–6)

## 2009-12-13 LAB — MAGNESIUM: Magnesium: 1.8 meq/L (ref 1.3–2.1)

## 2009-12-13 LAB — PHOSPHORUS: Phosphorus: 4.6 mg/dL — ABNORMAL HIGH (ref 2.7–4.5)

## 2009-12-13 LAB — MANUAL DIFFERENTIAL

## 2009-12-14 LAB — BACT BLOOD ISOLATOR-LINE SEPSIS

## 2009-12-15 LAB — CBC AND DIFFERENTIAL
Baso # K/uL: 0 THOU/uL (ref 0.0–0.1)
Basophil %: 0.2 % (ref 0.1–1.2)
Eos # K/uL: 0.4 THOU/uL (ref 0.0–0.4)
Eosinophil %: 4.1 % (ref 0.7–5.8)
Hematocrit: 29 % — ABNORMAL LOW (ref 34–45)
Hemoglobin: 8.7 g/dL — ABNORMAL LOW (ref 11.2–15.7)
Lymph # K/uL: 3.2 THOU/uL (ref 1.2–3.7)
Lymphocyte %: 37.3 % (ref 19.3–51.7)
MCV: 89 fL (ref 79–95)
Mono # K/uL: 0.7 THOU/uL (ref 0.2–0.9)
Monocyte %: 7.7 % (ref 4.7–12.5)
Neut # K/uL: 4.3 THOU/uL (ref 1.6–6.1)
Platelets: 129 THOU/uL — ABNORMAL LOW (ref 160–370)
RBC: 3.3 MIL/uL — ABNORMAL LOW (ref 3.9–5.2)
RDW: 17.1 % — ABNORMAL HIGH (ref 11.7–14.4)
Seg Neut %: 50.7 % (ref 34.0–71.1)
WBC: 8.6 THOU/uL (ref 4.0–10.0)

## 2009-12-15 LAB — BASIC METABOLIC PANEL
Anion Gap: 8 (ref 7–16)
CO2: 29 mmol/L — ABNORMAL HIGH (ref 20–28)
Calcium: 8.9 mg/dL — ABNORMAL LOW (ref 9.0–10.4)
Chloride: 99 mmol/L (ref 96–108)
Creatinine: 0.48 mg/dL — ABNORMAL LOW (ref 0.51–0.95)
GFR,Black: >59 *
GFR,Caucasian: >59 *
Glucose: 101 mg/dL (ref 74–106)
Lab: 14 mg/dL (ref 6–20)
Potassium: 3.8 mmol/L (ref 3.3–5.1)
Sodium: 136 mmol/L (ref 133–145)

## 2009-12-15 LAB — BLOOD CULTURE
Bacterial Blood Culture: NO GROWTH
Bacterial Blood Culture: NO GROWTH

## 2009-12-15 LAB — PHOSPHORUS: Phosphorus: 3.8 mg/dL (ref 2.7–4.5)

## 2009-12-15 LAB — MAGNESIUM: Magnesium: 1.7 meq/L (ref 1.3–2.1)

## 2009-12-16 LAB — CBC AND DIFFERENTIAL
Baso # K/uL: 0 THOU/uL (ref 0.0–0.1)
Basophil %: 0.3 % (ref 0.1–1.2)
Eos # K/uL: 0.3 THOU/uL (ref 0.0–0.4)
Eosinophil %: 3.6 % (ref 0.7–5.8)
Hematocrit: 30 % — ABNORMAL LOW (ref 34–45)
Hemoglobin: 9 g/dL — ABNORMAL LOW (ref 11.2–15.7)
Lymph # K/uL: 2.5 THOU/uL (ref 1.2–3.7)
Lymphocyte %: 33.4 % (ref 19.3–51.7)
MCV: 89 fL (ref 79–95)
Mono # K/uL: 0.7 THOU/uL (ref 0.2–0.9)
Monocyte %: 8.7 % (ref 4.7–12.5)
Neut # K/uL: 4.1 THOU/uL (ref 1.6–6.1)
Platelets: 135 THOU/uL — ABNORMAL LOW (ref 160–370)
RBC: 3.3 MIL/uL — ABNORMAL LOW (ref 3.9–5.2)
RDW: 17.3 % — ABNORMAL HIGH (ref 11.7–14.4)
Seg Neut %: 54 % (ref 34.0–71.1)
WBC: 7.5 THOU/uL (ref 4.0–10.0)

## 2009-12-16 LAB — PHOSPHORUS: Phosphorus: 3.8 mg/dL (ref 2.7–4.5)

## 2009-12-16 LAB — BASIC METABOLIC PANEL
Anion Gap: 10 (ref 7–16)
CO2: 27 mmol/L (ref 20–28)
Calcium: 9.4 mg/dL (ref 9.0–10.4)
Chloride: 100 mmol/L (ref 96–108)
Creatinine: 0.45 mg/dL — ABNORMAL LOW (ref 0.51–0.95)
GFR,Black: >59 *
GFR,Caucasian: >59 *
Glucose: 104 mg/dL (ref 74–106)
Lab: 13 mg/dL (ref 6–20)
Potassium: 3.8 mmol/L (ref 3.3–5.1)
Sodium: 137 mmol/L (ref 133–145)

## 2009-12-16 LAB — PREALBUMIN: Prealbumin: 23 mg/dL (ref 20–40)

## 2009-12-16 LAB — MAGNESIUM: Magnesium: 1.8 meq/L (ref 1.3–2.1)

## 2009-12-17 LAB — CBC AND DIFFERENTIAL
Baso # K/uL: 0 THOU/uL (ref 0.0–0.1)
Basophil %: 0.2 % (ref 0.1–1.2)
Eos # K/uL: 0.2 THOU/uL (ref 0.0–0.4)
Eosinophil %: 2.2 % (ref 0.7–5.8)
Hematocrit: 32 % — ABNORMAL LOW (ref 34–45)
Hemoglobin: 9.9 g/dL — ABNORMAL LOW (ref 11.2–15.7)
Lymph # K/uL: 2.5 THOU/uL (ref 1.2–3.7)
Lymphocyte %: 31.3 % (ref 19.3–51.7)
MCV: 89 fL (ref 79–95)
Mono # K/uL: 0.6 THOU/uL (ref 0.2–0.9)
Monocyte %: 6.8 % (ref 4.7–12.5)
Neut # K/uL: 4.8 THOU/uL (ref 1.6–6.1)
Platelets: 151 THOU/uL — ABNORMAL LOW (ref 160–370)
RBC: 3.6 MIL/uL — ABNORMAL LOW (ref 3.9–5.2)
RDW: 17.5 % — ABNORMAL HIGH (ref 11.7–14.4)
Seg Neut %: 59.5 % (ref 34.0–71.1)
WBC: 8 THOU/uL (ref 4.0–10.0)

## 2009-12-17 LAB — BASIC METABOLIC PANEL
Anion Gap: 10 (ref 7–16)
CO2: 26 mmol/L (ref 20–28)
Calcium: 9.4 mg/dL (ref 9.0–10.4)
Chloride: 102 mmol/L (ref 96–108)
Creatinine: 0.49 mg/dL — ABNORMAL LOW (ref 0.51–0.95)
GFR,Black: >59 *
GFR,Caucasian: >59 *
Glucose: 108 mg/dL — ABNORMAL HIGH (ref 74–106)
Lab: 15 mg/dL (ref 6–20)
Potassium: 3.7 mmol/L (ref 3.3–5.1)
Sodium: 138 mmol/L (ref 133–145)

## 2009-12-17 LAB — PHOSPHORUS: Phosphorus: 3.8 mg/dL (ref 2.7–4.5)

## 2009-12-17 LAB — MAGNESIUM: Magnesium: 1.7 meq/L (ref 1.3–2.1)

## 2009-12-19 HISTORY — PX: OTHER SURGICAL HISTORY: SHX169

## 2009-12-19 LAB — TYPE AND SCREEN
ABO RH Blood Type: O POS
Antibody Screen: NEGATIVE

## 2009-12-19 LAB — BASIC METABOLIC PANEL
Anion Gap: 12 (ref 7–16)
Anion Gap: 6 — ABNORMAL LOW (ref 7–16)
CO2: 23 mmol/L (ref 20–28)
CO2: 22 mmol/L (ref 20–28)
Calcium: 9.5 mg/dL (ref 9.0–10.4)
Calcium: 8.6 mg/dL — ABNORMAL LOW (ref 9.0–10.4)
Chloride: 102 mmol/L (ref 96–108)
Chloride: 107 mmol/L (ref 96–108)
Creatinine: 0.52 mg/dL (ref 0.51–0.95)
Creatinine: 0.38 mg/dL — ABNORMAL LOW (ref 0.51–0.95)
GFR,Black: >59 *
GFR,Black: >59 *
GFR,Caucasian: >59 *
GFR,Caucasian: >59 *
Glucose: 88 mg/dL (ref 74–106)
Glucose: 133 mg/dL — ABNORMAL HIGH (ref 74–106)
Lab: 9 mg/dL (ref 6–20)
Lab: 6 mg/dL (ref 6–20)
Potassium: 3.5 mmol/L (ref 3.3–5.1)
Potassium: 4.1 mmol/L (ref 3.3–5.1)
Sodium: 137 mmol/L (ref 133–145)
Sodium: 135 mmol/L (ref 133–145)

## 2009-12-19 LAB — CBC
Hematocrit: 32 % — ABNORMAL LOW (ref 34–45)
Hemoglobin: 9.9 g/dL — ABNORMAL LOW (ref 11.2–15.7)
MCV: 88 fL (ref 79–95)
Platelets: 147 THOU/uL — ABNORMAL LOW (ref 160–370)
RBC: 3.6 MIL/uL — ABNORMAL LOW (ref 3.9–5.2)
RDW: 17.8 % — ABNORMAL HIGH (ref 11.7–14.4)
WBC: 8.4 THOU/uL (ref 4.0–10.0)

## 2009-12-19 LAB — CBC AND DIFFERENTIAL
Baso # K/uL: 0 THOU/uL (ref 0.0–0.1)
Basophil %: 0.1 % (ref 0.1–1.2)
Eos # K/uL: 0 THOU/uL (ref 0.0–0.4)
Eosinophil %: 0.3 % — ABNORMAL LOW (ref 0.7–5.8)
Hematocrit: 20 % — ABNORMAL LOW (ref 34–45)
Hemoglobin: 6.3 g/dL — ABNORMAL LOW (ref 11.2–15.7)
Lymph # K/uL: 2.2 THOU/uL (ref 1.2–3.7)
Lymphocyte %: 24.1 % (ref 19.3–51.7)
MCV: 88 fL (ref 79–95)
Mono # K/uL: 0.5 THOU/uL (ref 0.2–0.9)
Monocyte %: 4.9 % (ref 4.7–12.5)
Neut # K/uL: 6.6 THOU/uL — ABNORMAL HIGH (ref 1.6–6.1)
Platelets: 128 THOU/uL — ABNORMAL LOW (ref 160–370)
RBC: 2.3 MIL/uL — ABNORMAL LOW (ref 3.9–5.2)
RDW: 17.9 % — ABNORMAL HIGH (ref 11.7–14.4)
Seg Neut %: 70.6 % (ref 34.0–71.1)
WBC: 9.3 THOU/uL (ref 4.0–10.0)

## 2009-12-19 LAB — PROTIME-INR
INR: 1.2 — ABNORMAL HIGH (ref 0.9–1.1)
Protime: 15 s — ABNORMAL HIGH (ref 11.9–14.7)

## 2009-12-19 LAB — HEMATOCRIT: Hematocrit: 25 % — ABNORMAL LOW (ref 34–45)

## 2009-12-19 LAB — APTT: aPTT: 37.7 s — ABNORMAL HIGH (ref 22.3–35.3)

## 2009-12-19 LAB — MAGNESIUM: Magnesium: 1.6 meq/L (ref 1.3–2.1)

## 2009-12-19 NOTE — Op Note (Signed)
 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    POSTOPERATIVE DIAGNOSIS:  Quadriplegia with neurogenic bladder and a total  urethral incompetence with urinary incontinence.    OPERATIVE PROCEDURE:      Cystoscopy, bilateral ureteral catheter insertion  followed by urethral tailoring and plication.    ANESTHESIA: General.    DESCRIPTION OF PROCEDURE:              After induction of general  anesthesia via her tracheostomy, the patient was placed in Allen stirrups  in the lithotomy position, prepped and draped with a Lingeman drape.  We  proceeded with cystoscopy to define ureteral orifices and the bladder neck.  The bladder neck could still be seen though was very dilated.  The ureteral  orifices looked somewhat patulous bilaterally.   #5 [place] whistle-tipped  ureteral catheters easily passed up the ureters to mark them.  We then  proceeded to mark a U-shaped vaginal incision, the tip at the meatus. This  was initiated with a #15 blade and then deepened by sharp dissection with  tenotomy scissors.  Attempt was made to keep a plane directly on urethra  and not button hole the vagina.  This was carried down both sides of the  flap to the bladder neck level or slightly deeper.  We achieved hemostasis  by cautery.  We then proceeded to create a broad based urethral flap which  was approximately 1 cm wide at the meatus and 2+ cm at the bladder neck.  We then marked the deep mucosal extent of this flap with a needle cautery.  We then proceeded to dissect off the mucosa slowly and carefully to have  not only a muscular but mucosally denuded flap.  Having accomplished this,  we then proceeded to plicate dorsally at the 12 o'clock position.  A suture  was placed into the mucosa at the 12 o'clock position.  Using the scissors,  an  incision of approximately 1+ cm wide mucosal strip was then performed.  This was left partially attached and internal plication and approximation  of the mucosa was then done with three 3-0 Vicryl sutures.  Having narrowed  the bladder neck dorsally, we then proceeded to close the bladder and  urethra with 3-0 Vicryls.   This was done with interrupted sutures to  maintain good circulation.  All sutures were tied outside the bladder or  urethra.  We had a good and vascular and quite thick  posterior muscular  bladder pedicle which covered our closure.  This was tacked laterally on  each side with a couple of 3-0 Vicryl sutures.   We copiously irrigated. We  then closed the vaginal mucosa of the U-shaped incision.  This was done  again with 3-0 Vicryl in multiple length running sutures.  A vaginal pack  was applied. An 18-French Foley catheter was inserted. We could feel the  balloon resided well in the bladder with a good urethral length present.  The patient was then transferred to her bed in stable and satisfactory  condition.  Blood loss was approximately 575 cc.  Total fluids received 2  liters of Ringers lactate.            Electronically Signed and Finalized  by  Willeen Niece, MD 01/02/2010 17:00  _____________________________________________  Les Pou  Earlene Plater, MD      DD:   12/19/2009  DT:   12/19/2009  7:06 P  DVI:  [id number]  RSD/JP2#[id number]    cc:   Willeen Niece, MD

## 2009-12-20 LAB — CBC AND DIFFERENTIAL
Baso # K/uL: 0 THOU/uL (ref 0.0–0.1)
Baso # K/uL: 0 THOU/uL (ref 0.0–0.1)
Baso # K/uL: 0 THOU/uL (ref 0.0–0.1)
Basophil %: 0.1 % (ref 0.1–1.2)
Basophil %: 0.1 % (ref 0.1–1.2)
Basophil %: 0.1 % (ref 0.1–1.2)
Eos # K/uL: 0.1 THOU/uL (ref 0.0–0.4)
Eos # K/uL: 0.1 THOU/uL (ref 0.0–0.4)
Eos # K/uL: 0.1 THOU/uL (ref 0.0–0.4)
Eosinophil %: 1.2 % (ref 0.7–5.8)
Eosinophil %: 1 % (ref 0.7–5.8)
Eosinophil %: 1.2 % (ref 0.7–5.8)
Hematocrit: 25 % — ABNORMAL LOW (ref 34–45)
Hematocrit: 24 % — ABNORMAL LOW (ref 34–45)
Hematocrit: 22 % — ABNORMAL LOW (ref 34–45)
Hemoglobin: 8 g/dL — ABNORMAL LOW (ref 11.2–15.7)
Hemoglobin: 7.6 g/dL — ABNORMAL LOW (ref 11.2–15.7)
Hemoglobin: 7.1 g/dL — ABNORMAL LOW (ref 11.2–15.7)
Lymph # K/uL: 3.5 THOU/uL (ref 1.2–3.7)
Lymph # K/uL: 3.3 THOU/uL (ref 1.2–3.7)
Lymph # K/uL: 3.3 THOU/uL (ref 1.2–3.7)
Lymphocyte %: 33.1 % (ref 19.3–51.7)
Lymphocyte %: 37.3 % (ref 19.3–51.7)
Lymphocyte %: 35.1 % (ref 19.3–51.7)
MCV: 87 fL (ref 79–95)
MCV: 88 fL (ref 79–95)
MCV: 87 fL (ref 79–95)
Mono # K/uL: 0.8 THOU/uL (ref 0.2–0.9)
Mono # K/uL: 0.8 THOU/uL (ref 0.2–0.9)
Mono # K/uL: 0.5 THOU/uL (ref 0.2–0.9)
Monocyte %: 7.8 % (ref 4.7–12.5)
Monocyte %: 9.2 % (ref 4.7–12.5)
Monocyte %: 5.6 % (ref 4.7–12.5)
Neut # K/uL: 6 THOU/uL (ref 1.6–6.1)
Neut # K/uL: 4.6 THOU/uL (ref 1.6–6.1)
Neut # K/uL: 5.5 THOU/uL (ref 1.6–6.1)
Platelets: 117 THOU/uL — ABNORMAL LOW (ref 160–370)
Platelets: 100 THOU/uL — ABNORMAL LOW (ref 160–370)
Platelets: 119 THOU/uL — ABNORMAL LOW (ref 160–370)
RBC: 2.9 MIL/uL — ABNORMAL LOW (ref 3.9–5.2)
RBC: 2.7 MIL/uL — ABNORMAL LOW (ref 3.9–5.2)
RBC: 2.5 MIL/uL — ABNORMAL LOW (ref 3.9–5.2)
RDW: 16.4 % — ABNORMAL HIGH (ref 11.7–14.4)
RDW: 17.3 % — ABNORMAL HIGH (ref 11.7–14.4)
RDW: 17.1 % — ABNORMAL HIGH (ref 11.7–14.4)
Seg Neut %: 57.8 % (ref 34.0–71.1)
Seg Neut %: 52.4 % (ref 34.0–71.1)
Seg Neut %: 58 % (ref 34.0–71.1)
WBC: 10.4 THOU/uL — ABNORMAL HIGH (ref 4.0–10.0)
WBC: 8.8 THOU/uL (ref 4.0–10.0)
WBC: 9.5 THOU/uL (ref 4.0–10.0)

## 2009-12-20 LAB — BASIC METABOLIC PANEL
Anion Gap: 6 — ABNORMAL LOW (ref 7–16)
CO2: 22 mmol/L (ref 20–28)
Calcium: 8.4 mg/dL — ABNORMAL LOW (ref 9.0–10.4)
Chloride: 108 mmol/L (ref 96–108)
Creatinine: 0.41 mg/dL — ABNORMAL LOW (ref 0.51–0.95)
GFR,Black: >59 *
GFR,Caucasian: >59 *
Glucose: 100 mg/dL (ref 74–106)
Lab: 5 mg/dL — ABNORMAL LOW (ref 6–20)
Potassium: 3.6 mmol/L (ref 3.3–5.1)
Sodium: 136 mmol/L (ref 133–145)

## 2009-12-20 LAB — URINALYSIS WITH MICROSCOPIC
Ketones, UA: NEGATIVE
Nitrite,UA: NEGATIVE
Protein,UA: 10 mg/dL — AB
RBC,UA: 249 /HPF — ABNORMAL HIGH (ref 0–2)
Specific Gravity,UA: 1.005 (ref 1.002–1.030)
WBC,UA: NONE SEEN /HPF (ref 0–5)
pH,UA: 6.5 (ref 5.0–8.0)

## 2009-12-20 LAB — DATE/TIME NOT PROVIDED

## 2009-12-21 LAB — CBC
Hematocrit: 22 % — ABNORMAL LOW (ref 34–45)
Hemoglobin: 6.9 g/dL — ABNORMAL LOW (ref 11.2–15.7)
MCV: 88 fL (ref 79–95)
Platelets: 110 THOU/uL — ABNORMAL LOW (ref 160–370)
RBC: 2.5 MIL/uL — ABNORMAL LOW (ref 3.9–5.2)
RDW: 17.2 % — ABNORMAL HIGH (ref 11.7–14.4)
WBC: 9.1 THOU/uL (ref 4.0–10.0)

## 2009-12-21 LAB — RED BLOOD CELLS: Red Blood Cells: TRANSFUSED

## 2009-12-21 LAB — BASIC METABOLIC PANEL
Anion Gap: 10 (ref 7–16)
CO2: 19 mmol/L — ABNORMAL LOW (ref 20–28)
Calcium: 8.3 mg/dL — ABNORMAL LOW (ref 9.0–10.4)
Chloride: 108 mmol/L (ref 96–108)
Creatinine: 0.43 mg/dL — ABNORMAL LOW (ref 0.51–0.95)
GFR,Black: >59 *
GFR,Caucasian: >59 *
Glucose: 91 mg/dL (ref 74–106)
Lab: 4 mg/dL — ABNORMAL LOW (ref 6–20)
Potassium: 3.3 mmol/L (ref 3.3–5.1)
Sodium: 137 mmol/L (ref 133–145)

## 2009-12-21 LAB — GRAM STAIN: Gram Stain: <=10

## 2009-12-21 LAB — MAGNESIUM: Magnesium: 1.3 meq/L (ref 1.3–2.1)

## 2009-12-21 NOTE — Discharge Summary (Signed)
INTERIM DISCHARGE SUMMARY:  This is an addendum to the previously dictated  summaries and covers the hospital course from 12/07/2009, to 12/21/2009.    DISCHARGE DATE:  Pending.    PRIMARY CARE PHYSICIAN:  Andrey Campanile, MD    HISTORY OF PRESENT ILLNESS:  Briefly, the patient was admitted with severe  sepsis and respiratory failure related to left lower lobe atelectasis and  pneumonia.  She required a tracheostomy for ventilator weaning, was  transferred to 834 for ongoing weaning attempts on 12/06/2009.  She is now  weaned.    HOSPITAL COURSE BY SYSTEMS:     1. Pulmonary:  She has been weaned since 12/12/2009, after being treated     for pneumonia and initiating cupping for a left lower lobe atelectasis.     She currently has a #6 metal tracheostomy and is tolerating her     tracheostomy being plugged on room air.  Chest x-ray from 12/21/2009,     shows improving left lower lobe atelectasis.     2. Cardiac:  She has had intermittent hypotensive episodes with systolic     pressures in the high 80s requiring fluid boluses.  Last was 12/20/2009.     She has also had intermittent tachycardic episodes of stable blood     pressure once she is stressed or being moved around.  This appears to be     her normal as she does have autonomic dysfunction.     3. F/E/N:  She was on Lasix initially but this was discontinued.  Her     weight is stable.  She is currently off of tube feedings and taking a     regular diet, that was advanced after a FEES exam on 12/13/2009.  She     continues on ProCel and Mighty Shake supplements.     4. GI:  She is on a bowel regime.     5. ID:  On 12/09/2009, she was started on empiric vancomycin, cefepime     and tobramycin for a febrile episode.  This was changed to ceftriaxone     on 12/11/2009, for Moraxella in her sputum and then to Zosyn on     12/12/2009, to cover vancomycin-sensitive enterococcus in her urine as     well as the Moraxella.  She completed a 10-day course of antibiotics  on     12/19/2009, and has been afebrile with a normal white count.     6. Hematology:  She is on fondaparinux for DVT prophylaxis due to her     history of HIT.  She has chronic anemia, came into the hospital with a     hematocrit of 28 that has slowly declined.  Preoperatively, on     12/19/2009, her hematocrit was 32, decreased to 20 postoperatively after     fluids and blood loss during surgery, required 1 unit of packed red     cells the morning of 12/20/2009, for a hematocrit of 20.  Her hematocrit     is now at 22.  The plan is to transfuse if she is symptomatic or has a     hematocrit less than 21.  She was started on enteral iron.     7. GU:  Because of her C6 quadriplegia, she has a neurogenic bladder     with an incompetent urethra, had trouble maintaining Foleys while she     was in the hospital.  Urology was called and they informed us that there  was a plan to do surgery just prior to her time of being admitted.     However, she was in the ICU when the surgery time came.  They     re-approached her and she wanted to go ahead with the urethral     plication, which was completed on 12/19/2009.  At that time a Foley was     placed.  Initially she had hematuria, which was expected, and now the     urine is clear.  The Foley continues and Urology is following.     8. Neurologic:  She is a C6 quadriplegic from an injury in 2006.  She     was complaining of spasms of tremors.  She was started on Zanaflex at     the recommendation of the rehabilitation doctors with positive effect.     She is now receiving this 3 times a day and her spasms are minimum.     9. Miscellaneous:  She is a full code.  She has an attentive family.    DISPOSITION:  The plan for discharge is to home.  Social Work is Psychiatrist services for her when she does go home.  Her mother has  been taught to do cupping, as this will probably need to be done to prevent  left lower lobe atelectasis from getting  worse.    DISCHARGE MEDICATIONS:  Medications will be dictated at the time of  discharge.          Dictated by:  Lilli Few, NP  Electronically Signed and Finalized by  Chauncey Reading, MD 12/30/2009 10:52  ___________________________________________  Chauncey Reading, MD  DD: 12/21/2009  DT: 12/21/2009  6:29 P  DVI: [id number]  WU/JW1#[id number]    cc:  Chauncey Reading, MD

## 2009-12-22 LAB — BASIC METABOLIC PANEL
Anion Gap: 12 (ref 7–16)
CO2: 20 mmol/L (ref 20–28)
Calcium: 9 mg/dL (ref 9.0–10.4)
Chloride: 105 mmol/L (ref 96–108)
Creatinine: 0.48 mg/dL — ABNORMAL LOW (ref 0.51–0.95)
GFR,Black: >59 *
GFR,Caucasian: >59 *
Glucose: 93 mg/dL (ref 74–106)
Lab: 5 mg/dL — ABNORMAL LOW (ref 6–20)
Potassium: 3.7 mmol/L (ref 3.3–5.1)
Sodium: 137 mmol/L (ref 133–145)

## 2009-12-22 LAB — CBC
Hematocrit: 26 % — ABNORMAL LOW (ref 34–45)
Hemoglobin: 8.1 g/dL — ABNORMAL LOW (ref 11.2–15.7)
MCV: 88 fL (ref 79–95)
Platelets: 123 THOU/uL — ABNORMAL LOW (ref 160–370)
RBC: 3 MIL/uL — ABNORMAL LOW (ref 3.9–5.2)
RDW: 17.6 % — ABNORMAL HIGH (ref 11.7–14.4)
WBC: 9.1 THOU/uL (ref 4.0–10.0)

## 2009-12-22 LAB — MAGNESIUM: Magnesium: 1.6 meq/L (ref 1.3–2.1)

## 2009-12-25 LAB — AEROBIC CULTURE: Aerobic Culture: NORMAL

## 2009-12-26 LAB — CBC
Hematocrit: 28 % — ABNORMAL LOW (ref 34–45)
Hemoglobin: 8.7 g/dL — ABNORMAL LOW (ref 11.2–15.7)
MCV: 89 fL (ref 79–95)
Platelets: 140 THOU/uL — ABNORMAL LOW (ref 160–370)
RBC: 3.2 MIL/uL — ABNORMAL LOW (ref 3.9–5.2)
RDW: 18 % — ABNORMAL HIGH (ref 11.7–14.4)
WBC: 6.5 THOU/uL (ref 4.0–10.0)

## 2009-12-26 LAB — BASIC METABOLIC PANEL
Anion Gap: 11 (ref 7–16)
CO2: 22 mmol/L (ref 20–28)
Calcium: 9.3 mg/dL (ref 9.0–10.4)
Chloride: 104 mmol/L (ref 96–108)
Creatinine: 0.42 mg/dL — ABNORMAL LOW (ref 0.51–0.95)
GFR,Black: >59 *
GFR,Caucasian: >59 *
Glucose: 87 mg/dL (ref 74–106)
Lab: 6 mg/dL (ref 6–20)
Potassium: 3.5 mmol/L (ref 3.3–5.1)
Sodium: 137 mmol/L (ref 133–145)

## 2009-12-26 LAB — PHOSPHORUS: Phosphorus: 3.8 mg/dL (ref 2.7–4.5)

## 2009-12-26 LAB — PREALBUMIN: Prealbumin: 19 mg/dL — ABNORMAL LOW (ref 20–40)

## 2009-12-26 LAB — MAGNESIUM: Magnesium: 1.5 meq/L (ref 1.3–2.1)

## 2009-12-27 LAB — CBC AND DIFFERENTIAL
Baso # K/uL: 0 THOU/uL (ref 0.0–0.1)
Basophil %: 0.1 % (ref 0.1–1.2)
Eos # K/uL: 0.2 THOU/uL (ref 0.0–0.4)
Eosinophil %: 2.8 % (ref 0.7–5.8)
Hematocrit: 28 % — ABNORMAL LOW (ref 34–45)
Hemoglobin: 8.6 g/dL — ABNORMAL LOW (ref 11.2–15.7)
Lymph # K/uL: 2.9 THOU/uL (ref 1.2–3.7)
Lymphocyte %: 38.4 % (ref 19.3–51.7)
MCV: 88 fL (ref 79–95)
Mono # K/uL: 0.4 THOU/uL (ref 0.2–0.9)
Monocyte %: 5.4 % (ref 4.7–12.5)
Neut # K/uL: 4 THOU/uL (ref 1.6–6.1)
Platelets: 148 THOU/uL — ABNORMAL LOW (ref 160–370)
RBC: 3.2 MIL/uL — ABNORMAL LOW (ref 3.9–5.2)
RDW: 17.8 % — ABNORMAL HIGH (ref 11.7–14.4)
Seg Neut %: 53.3 % (ref 34.0–71.1)
WBC: 7.4 THOU/uL (ref 4.0–10.0)

## 2009-12-27 LAB — BASIC METABOLIC PANEL
Anion Gap: 12 (ref 7–16)
CO2: 21 mmol/L (ref 20–28)
Calcium: 9.5 mg/dL (ref 9.0–10.4)
Chloride: 104 mmol/L (ref 96–108)
Creatinine: 0.44 mg/dL — ABNORMAL LOW (ref 0.51–0.95)
GFR,Black: >59 *
GFR,Caucasian: >59 *
Glucose: 119 mg/dL — ABNORMAL HIGH (ref 74–106)
Lab: 8 mg/dL (ref 6–20)
Potassium: 3.4 mmol/L (ref 3.3–5.1)
Sodium: 137 mmol/L (ref 133–145)

## 2009-12-28 LAB — URINALYSIS WITH REFLEX TO MICROSCOPIC
Ketones, UA: NEGATIVE
Nitrite,UA: POSITIVE — AB
Protein,UA: 30 mg/dL — AB
Specific Gravity,UA: 1.019 (ref 1.002–1.030)
pH,UA: 5.5 (ref 5.0–8.0)

## 2009-12-28 LAB — URINE MICROSCOPIC (IQ200)
RBC,UA: 24 /HPF — ABNORMAL HIGH (ref 0–2)
WBC,UA: 19 /HPF — ABNORMAL HIGH (ref 0–5)

## 2009-12-29 LAB — LEGIONELLA CULTURE

## 2009-12-30 LAB — GRAM STAIN- VAGINOSIS

## 2009-12-30 LAB — VAGINITIS SCREEN: DNA PROBE

## 2009-12-31 NOTE — Discharge Summary (Signed)
 Please see previous dictation summaries regarding details of hospital  course prior to the dates covered by this dictation.    SUMMARY OF HOSPITAL COURSE:  1.  Pulmonary:  ??          Dictated by:  Shonna Chock, MD,RES  Electronically Signed and Finalized by  Cathleen Corti, MD 01/06/2010 12:33  ___________________________________________  Cathleen Corti, MD  DD:  12/31/2009  DT:  12/31/2009  3:43 P  DVI: [id number]  WU/JW1#[id number]    cc:  Cathleen Corti, MD

## 2009-12-31 NOTE — Discharge Summary (Signed)
 INTERIM DISCHARGE SUMMARY:  This covers hospital course dates December 21, 2009, through December 26, 2009.    UPDATE TO HOSPITAL COURSE BY SYSTEMS:     1. Pulmonary:  Her intermittent left lower lobe atelectasis has overall  improved.  Decision to keep her #6 metal tracheostomy tube in place for  access for suctioning of secretions was made with her mother's agreement.  She is tolerating plugging of that tube with a stopper and the use of  intermittent nasal cannula oxygen.  In an effort to assist her with  pulmonary toilet, a cough assist device was tried on December 26, 2009, but  she did not like this device and its effect.  She also tried the percussive  vest, which she tolerated much better.  Plan is to pursue using this vest 2  to 3 times per day at home if it can be set up.  Social work is aware of  this and trying to obtain a percussive vest for her at home.  She was also  treated for Pseudomonas in the sputum, likely tracheobronchitis, starting  on December 24, 2009, with TOBI nebs and the plan for that is a 14 day  course.     2. Hematologic:  She continued with anemia postoperatively.  Although it     was stable, she did start on enteral iron.    In anticipation of her being discharged to home, social work continues to  work on obtaining increased nursing care for her at  home, as well as  assisting with obtaining trach supplies, suction supplies, urinary  catheters for her ongoing urinary catheter post her urethral plication, as  well as the percussive vest, and she is transferred to a regular floor  awaiting this arrangement.  Plan is for her mother to demonstrate trach  care and suctioning prior to her discharge.  The patient was transferred to  [id number] on December 26, 2009, awaiting discharge.              Dictated by:  Tona Sensing, NP  Electronically Signed and Finalized by  Chauncey Reading, MD 01/01/2010 16:46  ___________________________________________  Chauncey Reading,  MD  DD:  12/31/2009  DT:  12/31/2009  6:24 P  DVI: [id number]  WUJ/WJ#[id number]    cc:  Andrey Campanile, MD       Chauncey Reading, MD

## 2009-12-31 NOTE — Discharge Summary (Signed)
 This dictation will cover dates of admission from 12/26/2009 to date of  discharge on 12/31/2009.    Please see previous dictations for details regarding hospital course prior  to the dates covered by this dictation.    SUMMARY OF HOSPITAL COURSE FROM DATES ABOVE:     1. Pulmonary:  The patient was continued on her tobramycin nebs for her     tracheobronchitis and continued to do well.  The patient was set up for     home nebs, suction as well as other necessary supplies needed for     appropriate care at home.  The patient's mother was taught how to do     appropriate deep suctioning to help prevent mucous buildup. The     patient's mother was also taught how to do cupping and trach care for     further care and assistance at home and the patient's mother refused     being discharged home with a vest to help with the patient's left lower     atelectasis and therefore, one was not established.  They are advised to     be aggressive with suctioning and cupping as needed.  The patient     tolerated trach plugging well throughout her stay on the medical floors     and will be discharged home with tobramycin nebs.  The patient should     follow up with Pulmonary as scheduled.     2. Urinary tract infection:  The patient had loosening of her chronic     indwelling Foley and Urology was consulted for assistance and they     helped further secure her Foley in place.  At that time, it was found     that the patient had a questionable UTI and therefore, urine cultures     were checked as well as a UA.  The urine cultures and UA were consistent     with a urinary tract infection and the patient's cultures were     significant for fungi, and therefore, the patient was started on     Diflucan which she completed 4/5 days while in the hospital.     Adequately, the patient had E. coli in her urine, greater than 100,000     CFUs which was resistant to everything except gentamicin,     nitrofurantoin.  The patient was therefore  started on Macrobid and is to     complete this course as an outpatient as she is adamant about leaving     today.  The patient was given a prescription for the [facility] and     advised of the importance to complete this antibiotic course.     3. Miscellaneous:  The patient was seen by Physical Therapy,     Occupational Therapy and Social Work and was given exercises to perform     at home.  The patient's mother as describe above was taught trach care     as well as suctioning and cupping and should perform these diligently as     an outpatient.  Aside from anything mentioned above, the patient was     continued on medications that were started by Pulmonary while the     patient was on wean team.    CONDITION ON DISCHARGE:  Stable.    DISCHARGE MEDICATION LIST:  Please see [facility] discharge medication list for  further information.    DISCHARGE INSTRUCTIONS:  The patient is to follow up with her  primary care  physician within 5 to 7 days following discharge.  The patient is also to  follow up with Pulmonary as scheduled as an outpatient.  Lastly, the  patient is to continue with her tobramycin nebs, pulmonary rehab care as  described above by the patient and her mother, as well as completion of her  antibiotic course for her urinary tract infection as stated above.          Dictated by:  Shonna Chock, MD,RES  Electronically Signed and Finalized by  Cathleen Corti, MD 01/06/2010 12:33  ___________________________________________  Cathleen Corti, MD  DD:  12/31/2009  DT:  12/31/2009  5:23 P  DVI: [id number]  WJ/XB#[id number]    cc:  Cathleen Corti, MD       Andrey Campanile, MD

## 2010-01-01 LAB — AEROBIC CULTURE

## 2010-01-01 NOTE — Discharge Med List (Signed)
 [id number] Name: Cindy Ochoa, Cindy Ochoa  Medical Record Number:     [id number]  Admission Date:                 11/17/2009  Attending Physician:  Cathleen Corti, MD      PATIENT DISCHARGE MEDICATION LIST        Bisacodyl (10 mg): 10 mg RECTAL Every other day    Ferrous Sulfate (325MG  Tablet): 325 mg By Mouth twice a day    Fluconazole (100 MG Tablet): 100 mg By Mouth Once per day Take       for one more day. Last dose on 01/01/10    MACROBID (NITROFURANTOIN/NITROFURAN MAC) (100 mg): By Mouth       twice a day Take for 5 days total    Miconazole Nitrate (1 LAYER POWDER): 1 Layer Topical Every 12       hours    TIZANIDINE HCL (4 MG Tablet): 4 mg Oral/NG Tube three times a day    TOBRAMYCIN/0.25 NORMAL SALINE (300 mg/5 mL): 300 mg INH twice a       day Take for 7 more days after discharge    ZINC SULFATE (220MG  Capsule): 220 mg By Mouth Once per day    Albuterol Sulfate (1 PUFF Aerosol): 6 puf INH Every 4 hours As       Needed for WHEEZING    ZOFRAN (Ondansetron HCL) (4 mg Tablet): By Mouth Every 6 hours       As Needed for Nausea/vomitting        PLEASE STOP THESE MEDICATIONS    No Medications qualify for this section.    Electronically signed and finalized by Shonna Chock                  DD:   12/31/2009  DT:   01/01/2010  6:30 A  DVI:  [id number]    cc:   Andrey Campanile, MD

## 2010-01-02 LAB — GC CULTURE

## 2010-01-02 NOTE — Discharge Instructions (Signed)
 Patient Name:                Cindy Ochoa, Cindy Ochoa  Age:                                    24  dob:                  .[id number]  Sex:                                     F  Medical Record Number:     [id number]  Admission Date:                 11/17/2009  Discharge Date:                 12/31/2009  Attending Physician:  Cathleen Corti, MD          ------------------------------------------------------------------------  Consulting Providers / Services: None    ------------------------------------------------------------------------  HOSPITAL SUMMARY:  Reason for hospitalization: Pneumonia    Brief Hospital Course:  You were admitted for pneumonia and a urinary tract infection and d/t  difficulty breathing, transferred to the intensive care unit.   You  received IV antibiotics but required intubation.   It was difficult to  wean you off the ventilator and a tracheostomy was required.   Urology  was consulted for dilation of your urethra and difficulties with the  foley catheter.   Your respiratory status improved and your  tracheostomy was plugged.   You developed a tracheobronchitis, and  started on a 14 day course of tobramycin nebulizers.   You were  transferred to the floors   to continue your treatment.   You were  found to have a urinary tract infection with yeast and bacteria.   We  started you on Bactrim antibiotic and fluconzole antifungal. Urology  came and re-evaluated your urethral plication.     Upon discharge were  in satisfactory condition.   Your mother was given education on  respiratory care in the hospital. Please continue the tobramycin  nebulizer treatments, and make an appointment to follow up with your  primary care physician, and ENT specialists for your tracheostomy.  Please continue your courses of antibiotics and the antifungal.    Procedures / Therapy / Surgery: Tracheostomy  Urethral plication    Discharge Diagnoses:  1) Pneumonia  2) Sepsis  3) Tracheobronchitis  3) Urinary  tract  infection    Pending Labs: none    Continuing IV and other therapies(catheter type): Tracheostomy    Disposition: Discharged to home (no services)    ------------------------------------------------------------------------  INSTRUCTIONS:    Call MARTIN,PRISCILLA MD at on-call phone [phone]    promptly if you experience any of these symptoms:  nausea, vomiting, diarrhea, blood in stool, loss of consciousness, new  or worsening headache, or weakness., Poor urinary output, Fever of 101  F. or greater    If you cannot reach the provider above, then call:  [phone], Doctor's answering service    Diet: regular routine    Activity: No restrictions    Wound Care:  Keep good hygiene at urinary and vaginal areas, and wash front to  back. Please call if any purulent discharge develops.   Please call  PCP if ulcers develop.    Pain Management Plan: See below, in  medications    Other Instructions from provider:  Smoking cessation counseling N/A - no/remote smoking history.    The following interventions were recorded for this case based on diagnoses:    ACEI prescribed at discharge - No (Not indicated)  ARB prescribed at discharge - No (Not indicated)  Anti-coagulant prescribed at discharge - No (Not indicated)  Beta Blocker prescribed at discharge - No (Not indicated)  CHF symptoms teaching sheet reviewed with patient - No (Not indicated)  Diuretics prescribed at discharge - No (Not indicated)  EF measurement taken - No (Not indicated)  Flu vaccine given - No (Vaccination is up to date)  Pneumovax vaccine given - No (Vaccination is up to date)  Smoking Cessation counseling completed - No (NA - no history of smoking  over the last 12  months)  Weigh yourself daily at the same time each day; call the doctor if your  weight increases by 2 or 3 pounds in a day or 5 pounds in one week - No  (Not indicated)      ------------------------------------------------------------------------  Allergies:  Heparin Agents - thrombocytopenia         Nitrofurantoin - Vomiting       Vancomycin - Nausea         ------------------------------------------------------------------------    Medications:  See separate discharge medication list.  The Medication list  will be faxed separately from the instructions to the PCP and given in hard  copy to the patient at discharge. If problems finding the discharge  medication list call the [facility] help desk at [phone].  Non-Medication items  1) Bilateral Comfy Hand Splints       , 0 Refills (Rx given)  Indication/Comment:Apply at Night to hands, per   Occupational Therapy    2) Trach masks   use as directed qs lifetime of need   (Rx given)  Indication/Comment:Dx:     Tracheostomy, tracheobronchitis    3) Urinary catheter supplies--specialized       (Rx given)    4) Trach care kits with refills   quantities sufficient lifetime   (Rx  given) Indication/Comment:Dx:     Tracheostomy, tracheobronchitis    5) Oxygen nasal cannula    6) arm pads       (Rx given)    7) portable suction machine with suction canister and tubing  lifetime need   (Rx given) Indication/Comment:Dx:  Tracheobronchitis and   tracheostomy.    8) Yankauer oral suction catheter   use as directed qs lifetime need  (Rx given) Indication/Comment:Dx:     Tracheostomy, tracheobronchitis    9) Disposable trach collars   use as directed #12 lifetime need   (Rx  given) Indication/Comment:Dx:     Tracheostomy, tracheobronchitis    10) drainage pads       (Rx given)    11) spare trach shiley cuffed #6   QS lifetime need   (Rx given)  Indication/Comment:Dx:     Tracheostomy, tracheobronchitis    12) Inner trach cannulas size 6   use as directed, lifetime need   (Rx  given) Indication/Comment:Dx:     Tracheostomy, tracheobronchitis    13) saline squirts   lifetime need   (Rx given) Indication/Comment:Dx:      Tracheostomy, tracheobronchitis    14) suction catheters 12 french   use as directed qs lifetime need  (Rx given) Indication/Comment:Dx:     Tracheostomy,  tracheobronchitis    15) nebulizer   use as directed lifetime need   (  Rx given)  Indication/Comment:Dx:     Tracheostomy, tracheobronchitis    16) Tobramycin nebulizer 300 mg   Inhale BID for 7 days   (Rx given)  Indication/Comment:Dx:     Tracheostomy, tracheobronchitis      ------------------------------------------------------------------------  Followup appointments:    MARTIN,PRISCILLA (specialty: Medicine General) Addr: [address] [place], [place] [address] . Please call to make appointment   for  on/after Jan 03, 2010 at 3:00 PM, ph: [phone]    PETRONACI,CAROLLYNN (specialty: Medicine General) CLINIC:  MED-PULMONARY @[facility] . Addr: [address], [place], [place] [address]    Appointment HAS been made   for you for Jan 27, 2010 at 1:00 PM, ph:  [phone], Instructions: Office will call you with an appointment  within a week.    DAVIS,ROBERT S (specialty: Urology General) CLINIC: [facility]. Addr: [facility] [address],  [place], [place] [address]   Appointment HAS been made   for you for Jan 14, 2010 at 2:15 PM, ph: [phone], Instructions: Location: [facility] - [facility] to 2nd floor.    DOERR,TIMOTHY D (specialty: Surgery Otolaryngology) CLINIC:  OTOLARYNGOLOGY @[facility] . [facility] [address]  [place], [place], [place] [address]   Appointment HAS been made   for you for Jan 03, 2010 at 11:30 AM, ph: [phone], Instructions: Location: [address]  [facility]    Outpatient Tests / Procedures post discharge: NONE.    ------------------------------------------------------------------------  INTERDISCIPLINARY DISCHARGE-PLANNING SECTION      Reason for discharge: Acute inpatient care no longer required    Homecare Agency: NONE.    ------------------------------------------------------------------------  Medical Equipment/Supplies Agency/Vendor:    Item: nubulizer to be delivered by [employer] [facility] [employer],  ph:  [phone]  Item: other suction supplies to be delivered by [employer], ph:  [phone], addr: [address]  Item: tracheostomy to be delivered by [employer], ph: [phone],  addr: [address]  I

## 2010-01-03 ENCOUNTER — Ambulatory Visit: Payer: Self-pay | Admitting: Family

## 2010-01-10 ENCOUNTER — Encounter: Payer: Self-pay | Admitting: Gastroenterology

## 2010-01-14 ENCOUNTER — Ambulatory Visit: Payer: Self-pay | Admitting: Family

## 2010-01-14 ENCOUNTER — Inpatient Hospital Stay
Admit: 2010-01-14 | Discharge: 2010-01-14 | Disposition: A | Discharge status: Home / Self Care | Payer: Self-pay | Admitting: Physical Medicine and Rehabilitation

## 2010-01-14 NOTE — Letter (Addendum)
 January 14, 2010    Andrey Campanile, MD  [address]  [place], [place]  [address]      RE:   Cindy, Ochoa  DOB:  Jan 17, 1986  Unit#: [id number]    Dear Dr. Daphine Deutscher:    Cindy Ochoa is a 24 year old female seen in urology followup clinic today.  As you know, she has a neurogenic bladder which is managed with a Foley  catheter.  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  Since that time,  she spent a few more weeks hospitalized secondary to bronchitis and has  since been discharged home with her mother.  She reports that she has had  the same catheter in since her surgery and that she has had very minimal  leaking which only occurs when she has a significant muscle spasm.  There  have been some UTIs that have been treated by her medical team.    On exam today is a young female seated in a wheelchair in no distress.  She  did have some significant diaphoresis.  Her blood pressure is 100/66, heart  rate 105, and temperature is 36.4.  She was transferred to the exam table.  Her Foley catheter was removed without any difficulty.  She was having her  menses.  There was no irritation or erythema of the tissue.  We went to  place a #18 catheter which was the size that she had in and met some mild  resistance so opted for a smaller #14 [place] catheter.  This was inserted  easily, and the balloon was hyperinflated to 13 cc.    Her mother was present, and the plan is for Cindy Ochoa's mother to change the  catheter at least every 4 weeks.  We instructed that she needs to take  great caution at the surgical site so as not to cause any trauma to the  grafted area.  We recommended to place a catheter at an upward angle and to  be certain that there was decent urine flow and not to inflate the balloon  until it was sure it was past the urethra and well into the bladder.  We  advised that the 5 cc balloon of the catheter be hyperinflated to  between  12 and 15 cc to provide a better angle at the bladder neck, thus preventing  urethral stretching.  She understands these concepts.  If she does have any  trouble with catheterizing, we would be happy to change the catheter in the  clinic setting.  We will plan to follow up with Rene Kocher in 6 months unless  she has any urinary problems in the interim.      Sincerely,      Dictated by:  Kennith Gain, NP  Electronically Signed and Finalized by  Kennith Gain, NP 01/17/2010 16:43  ____________________________________  Kennith Gain, NP        DD:   01/14/2010  DT:   01/14/2010  9:16 P  UEA/VW#[id number]  [id number]      cc:   Andrey Campanile, MD

## 2010-01-21 NOTE — Progress Notes (Signed)
HPI   CC: s/p discharge 1/3  for pneumonia, osteomyelitis, UTI, tracheostomy  was   discharged to care of her  mother there is no agency that will commit to   caring for patient...     pneumonia states she has not had fever her mother does have to suction her   periodically during the day for large amount thick white sputum  Cindy Ochoa is   clean and dry  has not had any episodes of plugging      urine clear yellow no odor had change today      decubiti right hip minimal drainage  closing mother has been changing her   position frequently has not rescheduled appointment for f/u      return from urology today had Foley changed...     [name] is reporting she is feeling well is still trying to secure some type   of assistance in her home to give her mother some time to herself.  Active Problems   Fluctuating Acute Osteomyelitis Of The Pelvic Region 2008 (730.05);   Recurrent with sacral decubiti and incontinence.  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  Current Meds   Non-medication order(s);Gloves Medium 100 gloves/box DVS; Rx  Non-medication order(s);Inser Tray 30 ml (accompanies catheter) -- use as   directed; Rx  Non-medication order(s);Leg Bag 17 oz -- use as directed; Rx  Depend Briefs Medium MISC;use up to five briefs per day as needed; Rx  Non-medication order(s);Drain Bag 2000 ml -- use as directed; Rx  Calcium 600 + D 600-200 MG-UNIT TABS;TAKE 1 TABLET TWICE DAILY; Rx  Docusate Sodium 100 MG Capsule;TAKE 1 CAPSULE TWICE DAILY; Rx  Non-medication order(s);dispense one free standing commode high back; Rx  Alcohol Preps Pad;USE AS DIRECTED; Rx  Oxybutynin Chloride 5  MG Tablet;TAKE 1 TABLET TWICE DAILY.; Rx  Non-medication order(s);cath secure. Use as directed to keep Foley catheter   in place. Dx: neurogenic bladder; Rx  Bisacodyl 10 MG Suppository;INSERT 1 SUPP DAILY PRN constipation; Rx   Constipation  Bisac-Evac 10 MG Suppository;USE AS DIRECTED; Rx  Ibuprofen 600 MG Tablet;TAKE 1 TABLET 3 TIMES DAILY PRN WITH FOOD; Rx  Non-medication order(s);Vac and Supplies for wound caredispense for 3   months.  Resuming 05/01/09.; Rx  Detrol LA 2 MG Capsule Extended Release 24 Hour;TAKE 1 CAPSULE DAILY.; Rx  Non-medication order(s);CHUX disposable underpadsDiagnosis:   Quadriparesisdisp - 3 boxes; Rx  Non-medication order(s);Duoderm adhesive Pads for wound care uadDiagnosis   Decubitus Ulcers ankle/sacrum; Rx  Ciprofloxacin HCl 500 MG Tablet;TAKE 1 TABLET EVERY 12 HOURS DAILY. Do not   start taking until a sterile urine sample is collected any time after   06/18/09; Rx  Non-medication order(s);# 18 French Catheters ( Balloon Style)  Diagnosis:   Neurogenic  BladderDispense : 24; Rx  Non-medication order(s);large urinary drainage bags for neurogenic bladder;   Rx  Ortho Evra 150-20 MCG/24HR Patch Weekly;PLACE 1 PATCH WEEKLY x 3, then one   week off; Rx  Non-medication order(s);Allevyn 5x5 inch apply daily to left hip wound; Rx  Amoxicillin 500 MG Capsule;TAKE 1 CAPSULE 3 TIMES DAILY FOR 10 DAYS.; Rx  Non-medication order(s);AIR OVERLAY MATTRESS; Rx  Non-medication order(s);pressure reduction mattressuse as directed#1; Rx  Non-medication order(s);INBACARE protective underware size large1 case; Rx  Mucinex 600 MG Tablet Extended Release 12 Hour;TAKE 1 TABLET TWICE DAILY   PRN cough; Rx  Nebulizer Compressor Miscellaneous;1 home nebulizer use as directed; Rx  CefTRIAXone Sodium 1 GM Solution Reconstituted (IJ);inject once daily for   eight doses; RPT  Multivitamins Tablet;TAKE 1 TABLET DAILY.; Rx  Non-medication order(s);Depend pull-up briefs. Size LargeUse  up to 5 times   daily;  Rx.  Allergies   Heparins; Thrombocytopenia  Latex-asked/denied  Nitrofurantoin CAPS; Vomiting.  Latex Screen   --Patient denies an allergy to latex or rubber products.  --Patient denies a reaction such as hives, swelling, or difficulty   breathing after contact with rubber products.  --Patient denies that they are currently on latex precautions.  **If yes to any of the above, institute latex precautions.**.  Vital Signs   Recorded by [facility] on 14 Jan 2010 02:07 PM  BP:89/71,   HR: 103 b/min,   Temp: 36.2 C,   Pain Scale: 0.  Recorded by Cindy Ochoa on 14 Jan 2010 03:53 PM  BP:100/66,   HR: 105 b/min,   Temp: 36.4 C,   Pain Scale: 0,   O2 Sat: 98 (%SpO2),  RA.  Physical Exam   --GENERAL APPEARANCE: pleasant aaando x3 seated in power chair   --HEENT: PERRL, EOMI, TMs normal, oropharynx clear trsach clean and dry   --LUNGS: decreased in bases otherwise clear O2 sat 98 RA  --HEART: RR  --EXTREMITIES: wound right hip  approx 1.5 cm diameter pink edges  --NEUROLOGIC: Alert and oriented x3.  Plan   24 y/o with HX of C6 quadriplegia, osteomyelitis if ischial tuberosities    arrives s/p hospitalization for Osteomyelitis/ decubiti ulcers, pneumonia,   s/p tracheostomy on arrival today reporting much improved     Pneumonia appears to have resolved BS remain decreased in bases no fevers   or chills using home nebulizer 2-4 times daily  --will repeat CXR today  --continue with Nebs as instructed   --F/U for evaluation of Trach as scheduled   --f/u with pulmonology as scheduled      Ulcer will schedule f/u with wound care  --continue with wound management as directed at discharge      Will look into replacement wheelchair for [facility] as observed the backing of   her chair is worn as well as the seat there is a hard piece of metal that   is pushing thru onto patients back.  Signature   Electronically signed by: Cindy Chain  FNP-BC; 01/21/2010 11:11 PM EST.

## 2010-01-27 ENCOUNTER — Ambulatory Visit
Admit: 2010-01-27 | Discharge: 2010-01-27 | Disposition: A | Discharge status: Home / Self Care | Payer: Self-pay | Admitting: Pulmonology

## 2010-01-27 ENCOUNTER — Ambulatory Visit
Admit: 2010-01-27 | Discharge: 2010-01-27 | Disposition: A | Discharge status: Home / Self Care | Payer: Self-pay | Source: Ambulatory Visit | Attending: Family | Admitting: Family

## 2010-02-05 ENCOUNTER — Ambulatory Visit: Payer: Self-pay | Admitting: Surgery

## 2010-02-05 NOTE — Progress Notes (Signed)
 Wound Center Written Verbal Orders       Start Date:  02/05/10  Order Requested by Rise Mu, NP     Is the patient seen by a Home Health Agency?  No    Has the wound been debrided?  Yes  Type of debridement:  Mechanical      ICD 9 Code:  707.0     Wound Information:  Wound Assessment:          --Location:  right ischium        --Size:  Length (cm):  1; Width (cm):  1; Depth:  0.5 cm;       --Type:  pressure ulcer/stage (I, II, III. IV):  3               --Tunneling:  No        --Undermining:  No        --Drainage:  Small       --Color:  Serosanguinous       --Odor:  No;        --Tissue Composition:    100% Pink;         --Periwound Skin:  Healthy/Intact        --Signs/Symptoms of Infection:  None;                 --Location:  left ischium        --Size:  Length (cm):  2; Width (cm):  1.5; Depth:  0.5 cm;        --Type:  pressure ulcer/stage (I, II, III. IV):  3               --Tunneling:  No        --Undermining:  No        --Drainage:  None       --Color:  Serous       --Odor:  No;        --Tissue Composition:    100% Pink chronic wound tissues        --Periwound Skin:  Mildly Macerated, Intact        --Signs/Symptoms of Infection:  None     Dressing Requirements: Aquacel 2x2 #30; Tegaderm hydrocolloid or DuoDerm   4x4 inch #30       --Duration of need:  30 days       --Frequency of change:  Every other day.  Signature   Electronically signed by: Rise Mu  N.P.; 02/05/2010 3:30 PM EST.

## 2010-02-05 NOTE — Progress Notes (Signed)
 Wound Healing Center Follow-up Note   Chief Complaint   Respiratory distress with mucus plug. Bilateral ischial pressure wounds.      HPI   Cindy Ochoa is a 24 year old paraplegic female being seen today for   management of resolving bilateral ischial pressure wounds. When she was   placed in the bed she became anxious and diaphoretic. She was unable to   take a deep breath through her trach and was producing white phlegm. Mother   states she is suctioned frequently at home and has not had any increase in   plegm production. She is having cloudy and sediment heavy urine lately and   mother is concerned for a urinary tract infection. At the last visit her   pressure wounds were clean and healing. She did have urology surgery to   tighten the urinary spinchter, but is still leaking urine. Mom has run out   of dressings.      CURRENT WOUND TREATMENT: Dry gauze     Allergies   Heparins; Thrombocytopenia  Latex-asked/denied  Nitrofurantoin CAPS; Vomiting.     Pain Assessment   SUBJECTIVE PAIN ASSESSMENT:  Numeric/Verbal     PAIN: Patient denies pain in the last week.     ROS   CONSTITUTIONAL: Patient with diaphoresis from dysreflexia. Heavy sweating   today. Appetite good, no fevers, night sweats or weight loss  CV: No chest pain, shortness of breath or peripheral edema  RESPIRATORY: Occlusive mucus plug with white thin phlegm.   GI: Incontinent on bowel regimen. No nausea/vomiting, abdominal pain  GU: Foley catheter is out. Her urine is clear and yellow.  Reported cloudy   urine  NEURO: Tetraplegic. Autonomic dysreflexia.   MUSCULOSKELETAL: Non-ambulatory with contractures of arms and legs.  SKIN: See HPI.     Wound Assessment:          --Location:  right ischium        --Size:  Length (cm):  1; Width (cm):  1; Depth:  0.5 cm;       --Type:  pressure ulcer/stage (I, II, III. IV):  3               --Tunneling:  No        --Undermining:  No        --Drainage:  Small       --Color:  Serosanguinous       --Odor:  No;     --Tissue Composition:    100% Pink;         --Periwound Skin:  Healthy/Intact        --Signs/Symptoms of Infection:  None;                 --Location:  left ischium        --Size:  Length (cm):  2; Width (cm):  1.5; Depth:  0.5 cm;        --Type:  pressure ulcer/stage (I, II, III. IV):  3               --Tunneling:  No        --Undermining:  No        --Drainage:  None       --Color:  Serous       --Odor:  No;        --Tissue Composition:    100% Pink chronic wound tissues        --Periwound Skin:  Mildly Macerated, Intact        --  Signs/Symptoms of Infection:  None     Assessment   IMPRESSION: Mucus plug causing respiratory distress. Bilateral ischial   pressure wounds in the setting of urine incontinence and paralysis     Plan   TREATMENT PLAN:  The mucus plug was suctioned and rapidly improved with   pulse oximetry of 100%. Lungs auscutated and with crackels, but clear to   the base. Patient without anxiety. Dr Earlene Plater came and reinserted the foley   catheter. Wound care to be Aquacel and Tegaderm Hydrocolloid dressings   every other day. I will order supplies from [facility]. Patient and mother   agreeable with the plan of care.     EDUCATION:  Wound instruction sheet completed and discussed with   patient/caregiver.     REFERRALS:  None     FOLLOW-UP:   1 month     .  Vital Signs   Recorded by lwallin on 05 Feb 2010 03:23 PM  BP:72/53,   HR: 120 b/min,   Resp: 30 r/min,   Temp: 96.7 F,   O2 Sat: 88 (%SpO2),  RA.  Recorded by lwallin on 05 Feb 2010 03:24 PM  BP:98/66,   HR: 100 b/min,   Resp: 20 r/min,   O2 Sat: 100 (%SpO2),  RA.  Signature   Electronically signed by: Rise Mu  N.P.; 02/05/2010 3:40 PM EST.

## 2010-02-14 ENCOUNTER — Ambulatory Visit: Payer: Self-pay

## 2010-02-17 NOTE — Progress Notes (Addendum)
 Otolaryngology   Ms. Garguilo is a 24 year old quadriplegic female that underwent a   tracheostomy last month for respiratory failure. She was admitted to the   MICU with pneumonia and sepsis. She was weaned off the ventilator and   discharged home after a one and a half month stay. She was discharged with   a #6 metal trach tube. She has kept the trach tube capped for most of the   time. She intermittently needs to uncap it because of choking sensation and   dyspnea. There has been no fevers or discolored secretions. She had some   mild bleeding from the stoma. Her mother has been doing all the trach care.   She has been using tap water to clean the trach and her daughter's airway.   She states that she was "kicked out" of the hospital and was never taught   how to deliver the proper trach care. She has minimal supplies for trach   care. She does not know who is in charge of delivering her supplies. They   do not know the status of her pulmonary function. She has no home care   agency. She had a tracheostomy after her accident. She was decannulated   prior to discharge.      PAST HISTORY:The patient's medical questionnaire including PMH, PSH, Family   History, Social History, and ROS is reviewed today and the document scanned   to the medical record. It is significant for quadriplegia, decubitus   ulcers, tracheotomy.      EXAMINATION: Ivi appears to be breathing comfortably thorough her upper   airway. She has a very weak cough. The trach is capped. The neck is soft.   The stoma is healthy. Small amount of granulation tissue on the inferior   aspect of the stoma. This was treated with silver nitrate. The trach   secretions are clear and not foul smelling. A metal #6 trach tube is in   place. The trach ties were replaced.      IMPRESSION: Tracheostomy fo recent respiratory failure. She has been   tolerating capping trials at home but the status of her respiratory system   is unknown at this time. I do not recommend  decannulation.  She wishes to   continue with the trach. I have prescribed what she needs for routine trach   care (Trach ties, trach cleaning kits, saline squirts, suction catheters,   etc). Proper trach care training was given today. Vicodin was prescribed   for pain around stoma. She will discuss with her pulmonologist the status   of her respiratory system. She may need the trach long term given her poor   pulmonary function.   Flonnie Hailstone   I saw this patient with Dr. Virgel Gess, the resident physician, and   personally reviewed and confirmed the history, physical examination,   electronic chart notes.  I agree with the assessment and plan as outlined    for Ms. Jamerson.  Her tracheostomy tube was changed.  We will consider the   issue of decannulation after she follows up with the pulmonary specialist.     Jewel Baize. Roanna Raider, MD [facility]  Associate Professor  .  Signature   Electronically signed by: Suezanne Cheshire  MD - Res.; 02/14/2010 7:11 PM   EST.  Electronically signed by: Curtis Sites  MD Attend.; 02/17/2010 6:19 PM   EST.

## 2010-02-28 ENCOUNTER — Ambulatory Visit: Payer: Self-pay | Admitting: Urology

## 2010-03-10 ENCOUNTER — Ambulatory Visit: Payer: Self-pay | Admitting: Surgery

## 2010-03-17 ENCOUNTER — Ambulatory Visit: Payer: Self-pay | Admitting: Surgery

## 2010-03-17 DIAGNOSIS — L89329 Pressure ulcer of left buttock, unspecified stage: Secondary | ICD-10-CM

## 2010-03-17 HISTORY — DX: Pressure ulcer of left buttock, unspecified stage: L89.329

## 2010-03-17 NOTE — Progress Notes (Signed)
Wound Center Written Verbal Orders   Start Date:  03/17/10  Order Requested by Rise Mu, NP     Is the patient seen by a Home Health Agency?  No    Has the wound been debrided?  Yes  Type of debridement:  Mechanical      ICD 9 Code:  707.0     Wound Information:  Wound Assessment             --Location:  left ischium        --Size:  Length (cm):  2; Width (cm):  0.5; Depth:  0.5 cm;        --Type:  pressure ulcer/stage (I, II, III. IV):  3               --Tunneling:  No        --Undermining:  No        --Drainage:  Moderate       --Color:  Serous       --Odor:  No;        --Tissue Composition:    100% Pink chronic wound tissues        --Periwound Skin:  Mildly Macerated, Intact        --Signs/Symptoms of Infection:  None     Dressing Requirements: Tegaderm hydrocolloid or DuoDerm 4x4 inch #30       --Duration of need:  30 days       --Frequency of change:  Every day due to urinary incontinence     LETTER OF MEDICAL NECESSITY     Please provide this patient with greater than the allowable amount of   dressings due to urinary incontinence. This requires daily dressings   changes.   Nurse, mental health   Electronically signed by: Rise Mu  N.P.; 03/17/2010 11:56 AM EST.

## 2010-03-17 NOTE — Progress Notes (Signed)
Wound Healing Center Follow-up Note   Chief Complaint   Bilateral ischial pressure wounds.      HPI   Cindy Ochoa is a 24 year old paraplegic female being seen today for   monthly management of bilateral ischial pressure wounds. Mother tells me   that the left ischial wound is healed, but that the left ischial wound has   reopened. Patient is having bladder spasms and leaking urine form the foley   catheter wetting dressings. She has no other complaints.     CURRENT WOUND TREATMENT: Tegaderm dressings     Allergies   Heparins; Thrombocytopenia  Latex-asked/denied  Nitrofurantoin CAPS; Vomiting.     Pain Assessment   SUBJECTIVE PAIN ASSESSMENT:  Numeric/Verbal     PAIN: Patient denies pain in the last week.     ROS   CONSTITUTIONAL: Appetite good, no fevers, night sweats or weight loss  CV: No chest pain, shortness of breath or peripheral edema  RESPIRATORY: No recent episodes of mucus plug. Denies cough or wheeze   GI: Incontinent on bowel regimen. No nausea/vomiting, abdominal pain  GU: Foley catheter is leaking. Her urine is clear and yellow.    NEURO: Tetraplegic. Autonomic dysreflexia.   MUSCULOSKELETAL: Non-ambulatory with contractures of arms and legs.  SKIN: See HPI.     Wound Assessment:          --Location:  right ischium       healed             --Location:  left ischium        --Size:  Length (cm):  2; Width (cm):  0.5; Depth:  0.5 cm;        --Type:  pressure ulcer/stage (I, II, III. IV):  3               --Tunneling:  No        --Undermining:  No        --Drainage:  Small       --Color:  Serous       --Odor:  No;        --Tissue Composition:    100% Pink chronic wound tissues        --Periwound Skin:  Intact        --Signs/Symptoms of Infection:  None     Assessment   IMPRESSION: Recurrent ischial wound in the setting of urine incontinence   and paralysis     Plan   TREATMENT PLAN:   Wound care to beTegaderm Hydrocolloid dressings every   other day. I will order supplies from [facility]. Patient and  mother   agreeable with the plan of care.     EDUCATION:  Wound instruction sheet completed and discussed with   patient/caregiver.     REFERRALS:  None     FOLLOW-UP:   1 month  .  Signature   Electronically signed by: Rise Mu  N.P.; 03/17/2010 10:47 AM EST.

## 2010-04-10 ENCOUNTER — Ambulatory Visit: Payer: Self-pay | Admitting: Ophthalmology

## 2010-04-14 ENCOUNTER — Ambulatory Visit: Payer: Self-pay | Admitting: Surgery

## 2010-05-15 ENCOUNTER — Ambulatory Visit: Payer: Self-pay

## 2010-05-15 LAB — URINALYSIS WITH MICROSCOPIC
Ketones, UA: NEGATIVE
Nitrite,UA: NEGATIVE
Protein,UA: 30 mg/dL — AB
RBC,UA: 1 /HPF (ref 0–2)
Specific Gravity,UA: 1.02 (ref 1.002–1.030)
WBC,UA: 3 /HPF (ref 0–5)
pH,UA: 6.5 (ref 5.0–8.0)

## 2010-05-16 ENCOUNTER — Ambulatory Visit: Payer: Self-pay | Admitting: Physical Medicine and Rehabilitation

## 2010-05-19 ENCOUNTER — Ambulatory Visit: Payer: Self-pay | Admitting: Surgery

## 2010-05-19 LAB — AEROBIC CULTURE

## 2010-05-19 NOTE — Progress Notes (Signed)
Wound Healing Center Follow-up Note   Wound Healing   Chief Complaint   Bilateral ischial pressure wounds.      HPI   Cindy Ochoa is a 24 year old paraplegic female being seen today for   management of bilateral ischial pressure wounds. Mother tells me that the   left ischial wound is closed and mother is covering with thin DuoDerm to   protect from urine leakage. The left ischial wound had reopened about 2   months ago and wound care had been with BioStep collagen and foam. Mother   tells me that patient now has LTC nurses to asist with wound care. There is   a DuoDerm over this wound today. Patient continues to have bladder spasms   and leaking urine from the foley catheter. She has no other complaints.      Allergies   Heparins; Thrombocytopenia  Latex-asked/denied  Nitrofurantoin CAPS; Vomiting.     Pain Assessment   SUBJECTIVE PAIN ASSESSMENT:  Numeric/Verbal     PAIN: Patient denies pain in the last week.     ROS   CONSTITUTIONAL: Appetite good, no fevers, night sweats or weight loss  CV: No chest pain, shortness of breath or peripheral edema  RESPIRATORY: No recent episodes of mucus plug. Denies cough or wheeze   GI: Incontinent on bowel regimen. No nausea/vomiting, abdominal pain  GU: Foley catheter is leaking. Her urine is clear and yellow.    NEURO: Tetraplegic. Autonomic dysreflexia.   MUSCULOSKELETAL: Non-ambulatory with contractures of arms and legs.  SKIN: See HPI.     Wound Assessment:          --Location:  right ischium       healed             --Location:  left ischium        --Size:  Length (cm):  2; Width (cm):  1; Depth:  0.5 cm;        --Type:  pressure ulcer/stage (I, II, III. IV):  3               --Tunneling:  No        --Undermining:  No        --Drainage:  Small       --Color:  Serous       --Odor:  No;        --Tissue Composition:    100% Pink chronic wound tissues        --Periwound Skin:  Intact        --Signs/Symptoms of Infection:  None     Assessment   IMPRESSION: Recurrent ischial  wound in the setting of urine incontinence   and paralysis     Plan   TREATMENT PLAN:   BioStep into the iscial wound and cover with Allevyn   adhesive foam. Use a skin prep to help better adhere the dresings. Use   Calmoseptine ointment on the other old scar to protect from the urine   mositre. use moisture retentive urine incontinence pads to hold the leaking   urine away from the wounds. Patient and mother agreeable with the plan of   care.     EDUCATION:  Wound instruction sheet completed and discussed with   patient/caregiver.     REFERRALS:  None     FOLLOW-UP:   1 month  .  Vital Signs   Recorded by cmorse on 19 May 2010 01:15 PM  BP:123/63,   HR: 74 b/min,  O2 Sat: 100 (%SpO2).  Signature   Electronically signed by: Rise Mu  N.P.; 05/19/2010 4:01 PM EST.

## 2010-05-19 NOTE — Progress Notes (Addendum)
Reason For Visit   Migraines.  HPI   24 yo F functional quad here with migraine headaches that occur around her   period and last for a 1week.  Described as a bilateral pounding, with   phonophobia, nausea, and mildy relieved by ibuprofren 800mg .  Interested in   prophylactic therapy.     Has a decubitus under right buttock.  Seeing wound clinic 5/23.     Mother brought up question of UTI.  She and Solara say Ladell has been   sweating alot -- but when questioned further, it's unlcear whether this is   more than her usual baseline diaphoresis due to her C-spine injury.  She   claims to have more leg spasms, though -- a symptom she only has with UTIs.    And, this causes her foley to come out more.  No known fever.  (Doesn't   check.)  Urine in foley bag has been dark.  Mother changes her foley   several times/week.  Uses the "brown kind" since Elona is allergic to the   "clear plastic" type.     Needs a letter stating what her needs are since she moved out on her own.    Mother will be moving to [place].  Katalia lives with her 57 year old sister   who works and goes to [employer].  Jesica's sister can only help with her   care sporadically.  Her mother will be by occasionally.  She will need 16   hrs/day of LPN level care because of suctioning her trach, wound care and   foley care.  Letter written by Dr. Daphine Deutscher.  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  Current Meds   Non-medication order(s);ITEM # [id number] CATH SUCT KIT 14FR 1 GLOVEDX: V55.0   AND 344; Rx  Non-medication order(s);Trach care kit [phone] daily and as needed as   directed; Rx  Gelnique  10 % Gel;APPLY DAILY; Rx  TiZANidine HCl 4 MG Tablet;take 1 tablet by mouth three times a day; Rx  Gentamicin Sulfate 40 MG/ML Solution;Nebulize 2 ml (80 mg) bid X 10 days; Rx  Non-medication order(s);# 18 French Catheters ( Balloon Style)  Diagnosis:   Neurogenic  BladderDispense : 24; Rx.  Allergies   Heparins; Thrombocytopenia  Latex-asked/denied  Nitrofurantoin CAPS; Vomiting.  Latex Screen   --Patient denies an allergy to latex or rubber products.  --Patient denies a reaction such as hives, swelling, or difficulty   breathing after contact with rubber products.  --Patient denies that they are currently on latex precautions.  **If yes to any of the above, institute latex precautions.**.  Vital Signs   Recorded by yramirez on 15 May 2010 10:16 AM  Temp: 35.0 C,   Pain Scale: 0.  Physical Exam   Nad, diaphoretic  anicteric  in wheelchair, functional quad.  buttock decubitus @ 1 cm  with granulation tissue, no erythema or discharge.  Results   AEROBIC CULTURE - AER   15 May 2010 12:00 PM  -   SOURCE: URINESource Modifier: GRAY TOP CLEAN CATCH  -   AEROBIC CULTURE  AEROBIC CULTURE  FINAL 05/19/10     Organism    Escherichia coli                  >100,000/ml     Organism    Beta hemolytic Streptococcus- not Group A or B                 30,000/ml  ___________________________________________________________________________  Organism                E coli   Antibiotics             Interp   ___________________________________________________________________________  Amikacin                   S     Gentamicin                 S     Tobramycin                 S     Ampicillin                 R     Cefazolin                  R     Ceftriaxone                S     Cefepime                   S     ESBL                       -     Ciprofloxacin              R     Nitrofurantoin             S     Trimethoprim/Sulfa         S      ___________________________________________________________________________  S=SUSCEPTIBLE I=INTERMEDIATE R=RESISTANT D=SUSCEPTIBLE-DOSE DEPENDENT  ___________________________________________________________________________  FOR HIGH LEVEL AMINOGLYCOSIDES: S=SYNERGY  R=NO SYNERGY  ___________________________________________________________________________  Eartha Inch W/ MICROSCOPIC - UARM   15 May 2010 12:00 PM  -   COLOR,UR: Yellow  -   APPEARANCE,UR: 1+ Cloudy  -   SPECIFIC GRAVITY,UR: 1.020  -   LEUK ESTERASE,UR: 3+  -   NITRITES,UR: NEG  -   PH,UR: 6.5   -   PROTEIN,UR: 30  -   GLUCOSE,UR: NORM  -   KETONES,UR: NEG  -   BLOOD,UR: 2+  -   RBC,UR: 1 /HPF  -   WBC,UR: 3 /HPF.  Plan   1. Menstrual Migraines - topamax for px, ibuprofren PRN  2. ?UTI - in process of obtaining UA after foley change.  3. buttock decub - healing well, granulation tissue  .  Attestation   Patient seen and discussed with Dr. Izell Carolina  Discussed:  Menstrual Migraines, small decubitus to be addressed by wound   team, UTI vs colonization with a chronic foley.  She is currently (final   cultures and sens today 5/23) growing out 100,000 colonies of E. coli.    Based on her leg spasm history, will treat as if this is a UTI -- sensitive   to Bactrim.  Retailed to [employer].     Agree with assessments and plans above.     Andrey Campanile, MD   Faculty, [facility]  .  Signature   Electronically signed  by: Wendee Beavers  MD - Res.; 05/15/2010 11:55 AM EST.  Electronically signed by: Andrey Campanile  M.D.; 05/19/2010 9:52 AM EST.

## 2010-05-30 ENCOUNTER — Ambulatory Visit: Payer: Self-pay | Admitting: Family

## 2010-05-30 NOTE — Progress Notes (Signed)
HPI   CC: patient arrives today requesting something for depressed mood states   when she begins to think of her life as it is  confined to wheelchair and   the inability to do daily ADL without assistance becomes  depressed is not   suicidal  "just feeling down" Cindy Ochoa has other issues at this time as well   she has moved out of her mothers home  in the hopes that she would be able   to secure Nursing services at home however this has not come to fruition   instead she is  opened to short term Nursing thru [facility] who   is trying to assist her with getting into Brain /spinal cord Injury program   with [facility] feels she has to learn how to take care of her self   for safety reasons she is living with her sister who is not always there to   assist her she does have  LPN services  but this will be for only 3 months   until she hopefully finds an agency to accept her  (all currently feel that   she is an unsafe case to open)      Cindy Ochoa  continues with c/o thick mucous has to be suctioned several times   daily for thick secretions has had occasions when the Cindy Ochoa has become   plugged      Indwelling catheter  last seen treated with Bactrim for UTI  denies any s/s   of UTI since  urine has been clear.  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  Current Meds   Non-medication order(s);ITEM # [id number] CATH SUCT KIT 14FR 1 GLOVEDX: V55.0   AND 344; Rx  Non-medication order(s);Trach care kit [phone] daily and as needed as   directed; Rx  Gelnique 10 % Gel;APPLY DAILY; Rx  Gentamicin Sulfate 40 MG/ML Solution;Nebulize 2 ml (80 mg) bid X 10 days;  Rx  Ibuprofen 600 MG Tablet;TAKE 1 TABLET EVERY 6 TO 8 HOURS AS NEEDED.; Rx  Topiramate 25 MG Tablet;1 tablet at night for 2 weeks, then 2 tablets   daily.; Rx  Sulfamethoxazole-Trimethoprim 400-80 MG Tablet;TAKE 2 TABLET TWICE DAILY   Drink plenty of water.; Rx  Calmoseptine 0.44-20.625 % Ointment;apply to buttocks daily and as needed;   Rx  TiZANidine HCl 4 MG Tablet;take 1 tablet by mouth three times a day; Rx  Non-medication order(s);# 18 French Catheters ( Balloon Style)  Diagnosis:   Neurogenic  BladderDispense : 24; Rx.  Allergies   Heparins; Thrombocytopenia  Latex-asked/denied  Nitrofurantoin CAPS; Vomiting.  Latex Screen   --Patient denies an allergy to latex or rubber products.  --Patient denies a reaction such as hives, swelling, or difficulty   breathing after contact with rubber products.  --Patient denies that they are currently on latex precautions.  **If yes to any of the above, institute latex precautions.**.  Vital Signs   Recorded by ctorres on 30 May 2010 09:46 AM  BP:85/50,   HR: 100 b/min,   Temp: 35.1 C,   Pain Scale: 0.  ROS   CONSTITUTIONAL: Appetite good, no fevers, night  sweats or weight loss  CV: No chest pain, shortness of breath or peripheral edema  RESPIRATORY: No cough, wheezing or dyspnea, trach is patent  reports green   thick secretions  requires suctioning  several times daily   GI: No nausea/vomiting, abdominal pain, or change in bowel habits  NEURO: quadriplegia  no sensory changes.  Physical Exam   --GENERAL APPEARANCE: NAD seated in powerchair   --LUNGS: cta  --EXTREMITIES: + spasm left leg   --NEUROLOGIC: Alert and oriented x3.  Plan   24 y/o quadriplegic  female arrives requesting medication for depression   denies SI/HI however her mood is  depressed  d/t multiple life stressors   -will trial fluoxetine  10 mg daily   --patient has declined psych  referral at this time      Cindy Ochoa has f/u with pulmonary medicine pending  continue with current Trach   care and suction  practices.  Signature   Electronically signed by: Acquanetta Chain  FNP-BC; 05/30/2010 1:02 PM EST.

## 2010-06-02 ENCOUNTER — Ambulatory Visit: Payer: Self-pay | Admitting: Pulmonology

## 2010-06-02 ENCOUNTER — Other Ambulatory Visit: Payer: Self-pay | Admitting: Pulmonology

## 2010-06-02 ENCOUNTER — Ambulatory Visit
Admit: 2010-06-02 | Discharge: 2010-06-02 | Payer: Self-pay | Source: Ambulatory Visit | Attending: Pulmonology | Admitting: Pulmonology

## 2010-06-05 ENCOUNTER — Ambulatory Visit: Payer: Self-pay | Admitting: Ophthalmology

## 2010-06-06 ENCOUNTER — Ambulatory Visit: Payer: Self-pay | Admitting: Dermatology

## 2010-06-11 NOTE — Progress Notes (Signed)
Message   Recorded as Task  Date: 06/05/2010 03:20 PM, Created By: Ladell Pier  Task Name: Go to Note  Assigned To: AMB MED SOCIAL WORKER  Regarding Patient: [name], [name], Status: Active  CommentLadell Pier - 05 Jun 2010 3:20 PM    TASK CREATED  Please see note. Can you call Revonda Standard from [facility] sometime tomorrow   or Monday. i'm not sure where to go with this. Jacki Cones.  Social Work Contact   PATIENT NAME:  Cindy Ochoa  MRN #: [id number]  Address: [address]  ([address])  [place], [place] [address]                 ACTION TAKEN:       I spoke with the [facility] Nurse regarding their request.  At this time our office does not need to do anything. The TBI Waiver   program was out on Friday and determined that until there is a private duty   nurse in place with back up they will not proceed to enact a waiver for her.  The home care agency is working to figure out the proper steps to secure   this skilled service for the patient.     PLAN:  -- Await word from [facility] about what they need from [place] to assist patient in   obtaining a private duty nurse.  --Patient with Social Work contact information and Social Work to remain   available for assistance.  Signature   Electronically signed byMarlan Palau  C.S.W.; 06/11/2010 3:07 PM EST;   Chartered loss adjuster.

## 2010-06-18 ENCOUNTER — Ambulatory Visit: Payer: Self-pay | Admitting: Surgery

## 2010-06-23 ENCOUNTER — Ambulatory Visit: Payer: Self-pay | Admitting: Surgery
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
## 2010-07-02 ENCOUNTER — Ambulatory Visit
Admit: 2010-07-02 | Discharge: 2010-07-02 | Disposition: A | Discharge status: Home / Self Care | Payer: Self-pay | Source: Ambulatory Visit | Attending: Pulmonology | Admitting: Pulmonology

## 2010-07-02 ENCOUNTER — Ambulatory Visit: Payer: Self-pay | Admitting: Surgery

## 2010-07-02 ENCOUNTER — Ambulatory Visit: Payer: Self-pay

## 2010-07-02 NOTE — Progress Notes (Addendum)
Reason For Visit   Increased airway secretions.  HPI   This is a 24 yr old female with PMH of C6 quadriparesis s/p tracheostomy   here for 3 d hx of worsening tracheostomy secretions and chest pain.  She   reports having had increased, secretions from her tracheostomy site and has   required increased frequency of pulmonary toilet.  She characterizes the   secretions as very thick, lime green colored, and are plugging her   tracheostomy site.  Denies blood in secretions.  Her symptoms are worse at   night and in the morning.  She is also complaining of chest pain, which she   characterizes as a sharp, sternal pain that does not radiate.  Pain is   worsened by pulmonary toilet maneuvers, denies any association with   physical activity or breathing.  Patient and her mother are concerned about   pneumonia, as she was hospitalized for 3 months as of last November with   pneumonia complicated by Left lung pneumothorax.  She is presently not   taking any treatment for her symptoms and is not on any inhalers.  Of note,   patient reports having lost the cap to her tracheostomy site a month ago   and has been using her hand to cover it in order to speak.  Per hx, patient   is able to breathe independently but is not able to generate significant   coughing effort and requires abdominal pressure to clear airway secretions,   which her mother helps her with.   She denies any SOB, fevers, chills,   nausea, vomiting, headaches, throat ache, or neck tenderness.       She has been in touch with Dr. Adela Ports regarding her symptoms and has   been prescribed antibiotics and given a referral for CXR.  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  Current Meds   Non-medication order(s);ITEM # [id number] CATH SUCT KIT 14FR 1 GLOVEDX: V55.0   AND 344; Rx  Non-medication order(s);Trach care kit [phone] daily and as needed as   directed; Rx  Non-medication order(s);# 18 French Catheters ( Balloon Style)  Diagnosis:   Neurogenic  BladderDispense : 24; Rx  Gelnique 10 % Gel;APPLY DAILY; Rx  TiZANidine HCl 4 MG Tablet;take 1 tablet by mouth three times a day; Rx  Gentamicin Sulfate 40 MG/ML Solution;Nebulize 2 ml (80 mg) bid X 10 days; Rx  Ibuprofen 600 MG Tablet;TAKE 1 TABLET EVERY 6 TO 8 HOURS AS NEEDED.; Rx  Topiramate 25 MG Tablet;1 tablet at night for 2 weeks, then 2 tablets   daily.; Rx  Calmoseptine 0.44-20.625 % Ointment;apply to buttocks daily and as needed;   Rx  FLUoxetine HCl 10 MG Capsule;TAKE 1 CAPSULE DAILY.; Rx  Santyl 250 UNIT/GM Ointment;apply to wounds with each dressing change; Rx  Doxycycline Hyclate 100 MG Capsule;TAKE 1 CAPSULE TWICE DAILY WITH FOOD; Rx  Benzoyl Peroxide-Erythromycin 5-3 % Gel;Apply a thin layer to face daily.    Keep in the refrig.; Rx  Tazorac 0.05 % Cream;use sparingly every other day at night, increase daily   if tolerated.; Rx  GenTeal Severe 0.3 % Gel;;  RPT  Refresh 1.4-0.6 % Solution;; RPT  Ciprofloxacin HCl 500 MG Tablet;TAKE 1 TABLET EVERY 12 HOURS FOR 7 DAYS.; Rx  Pulmozyme 1 MG/ML Solution;USE 1 UNIT DOSE 2 TIMES A DAY.; Rx.  Allergies   Heparins; Thrombocytopenia  Latex-asked/denied  Nitrofurantoin CAPS; Vomiting.  Latex Screen   --Patient denies an allergy to latex or rubber products.  --Patient denies a reaction such as hives, swelling, or difficulty   breathing after contact with rubber products.  --Patient denies that they are currently on latex precautions.  **If yes to any of the above, institute latex precautions.**.  Vital Signs   Recorded by scuevas on 02 Jul 2010 09:12 AM  BP:106/66,   HR: 98 b/min,   Temp: 36.9 C,   Pain Scale:  0.  Physical Exam   --PAIN: Patient  acknowledges pain in the last week.       --If yes, 0-10 pain rating: 5        --If pain rating > 3; location: sternum        --Duration of pain: over past 3 d        --Aggravating factors: maneuvers to clear airway secretions        --Relieving factors: rest   --GENERAL: NAD, pleasant, obese   --HEENT: No lymphadenopathy, no throat or neck tenderness.  Oral mucosa is   pink and moist, there is no tonsillar enlargement.   --CARDIOVASCULAR: RRR, normal s1 and s2   --RESPIRATORY: Coarse breath sounds, worse on Right lung fields compared to   Left.   Percussion of the lung fields did not reveal any focal findings.  Assessment   This is a 24 yr old female with PMH of C6 quadriparesis and multiple prior   episodes of PNA presenting today for 3d hx of worsening secretions.  She   appears well, is afebrile, and has normal vital signs.  Her symptoms of   chest pain do not appear to be cardiac-related and are likely associated   with increased frequency of use of pulmonary toilet.  Her increased   pulmonary secretions may be due to an acute viral or bacterial bronchitic   process, although pneumonia is a possibility as she has an extensive prior   history of pulmonary infections and is at high risk for redeveloping   infection.  - patient already has script for ciprofloxacin and pulmozyme, and will be   filling out her scripts today.  - patient to go for CXR, as per referral  - instructed patient to contact clinic or her pulmonologist if she develops   fever or worsening of symptoms.  Attestation   --I saw and evaluated the patient. I agree with the resident's findings and   plan of care as documented above.  Increased secretions and pain with   pulmonary toilet maneuvers with 82F with tracheostomy who recently had   prolonged hospital course.  Currently, she feels well.  Sputum which was   green yesterday is clear today.  Unfortunately she left before checking O2   saturation, although  had good air movement (R sided rhonchi as above) and   appeared quite comfortable.  Previous sputum cultures have grown   Pseudomonas and M. cattharalis.  She had contacted her pulmonologist   yesterday who prescribed ciprofloxacin and pulmozyme which she will pick up   today.  Also referral sent by pulmonologist for xray.  After discussion   with pulmonologist, seems that nursing services at home would be highly   desired  given her functional limitations and chronic diseases.  Will   discuss further with social work and primary care team.     Venita Sheffield, MD     07/02/2010.  Signature   Electronically signed by: Trinidad Curet  MD - Res.; 07/02/2010 10:32 AM EST.  Electronically signed by: Venita Sheffield  MD Attend.; 07/02/2010 10:48 AM   EST.

## 2010-07-02 NOTE — Progress Notes (Signed)
[facility] Follow-up Note   Chief Complaint   Left ischial pressure wound     HPI   Cindy Ochoa is a 24 year old paraplegic female being seen today for   management of a left ischial pressure wound. Mother has been applying the   dressings daily. She tells me that it is healing well.      Allergies   Heparins; Thrombocytopenia  Latex-asked/denied  Nitrofurantoin CAPS; Vomiting.     Pain Assessment   SUBJECTIVE PAIN ASSESSMENT:  Numeric/Verbal     PAIN: Patient denies pain in the last week.     ROS   CONSTITUTIONAL: Appetite good, no fevers, night sweats or weight loss  CV: No chest pain, shortness of breath or peripheral edema  RESPIRATORY: Tracheostomy. Denies cough or wheeze   GI: Incontinent on bowel regimen. No nausea/vomiting, abdominal pain  GU: Foley catheter. Her urine is clear and yellow.    NEURO: Tetraplegic. Autonomic dysreflexia.   MUSCULOSKELETAL: Non-ambulatory with contracture's of arms and legs.  SKIN: See HPI.     Wound Assessment:          --Location:  right ischium       healed             --Location:  left ischium        --Size:  Length (cm):  0.2; Width (cm):  1; Depth:  0.1 cm;        --Type:  pressure ulcer/stage (I, II, III. IV):  3               --Tunneling:  No        --Undermining:  No        --Drainage:  Scant       --Color:  Serous       --Odor:  No;        --Tissue Composition:    100% Pink tissues        --Periwound Skin:  Intact        --Signs/Symptoms of Infection:  None     Assessment   IMPRESSION: Much improved left ischial wound      Plan   TREATMENT PLAN:   Cover the wound daily with a cloth adhesive bandaide to   protect the scar. Patient and mother agreeable with the plan of care.     EDUCATION:  Wound instruction sheet completed and discussed with   patient/caregiver.     REFERRALS:  None     FOLLOW-UP:   3 months as needed  .  Vital Signs   Recorded by CUEVAS,SABRINA on 02 Jul 2010 09:12 AM  BP:106/66,   HR: 98 b/min,   Temp: 36.9 C,   Pain Scale: 0.  Signature      Electronically signed by: Rise Mu  N.P.; 07/02/2010 2:51 PM EST.

## 2010-07-09 ENCOUNTER — Ambulatory Visit: Payer: Self-pay

## 2010-07-09 NOTE — Progress Notes (Addendum)
Otolaryngology   Dear Dr. Andrey Campanile:     We had the pleasure of seeing your patient, Cindy Ochoa, in the   Otolaryngology  Clinic on July 09, 2010.  She is a 24 year old female being   seen in follow up for trach maintenance. Since her last visit she has been   able to speak around the trach with the cap in place, however she recently   lost the cap. As well she was treated for a pneumonia recently which was   thought to be secondary to aspiration. At this time it appears that she   would not be able to be decannulated due to her recent pulmonary   complications. She was also recently told by her pulmonologist that   decannulation may never be possible. She is currently having difficulty   with finding home care due to the trach and would like to be decannulated   if possible.  Vital Signs   Recorded by BAUSCH,JESSICA on 09 Jul 2010 10:36 AM  Pain Scale: 0.  Physical Exam   GEN: NAD, breathing comfortably, strong voice with occluding trach  OTO: EACs patent, TMs intact, no effusion  NOSE: no drainage, mucosa intact  ORAL CAVITY: no posterior nasal drainage, no lesions or masses, mucosa   intact  NECK: #6 metal trach in place, no crepitus, no drainage. Cindy Ochoa was changed   without issues. Stoma was patent, no granulation tissue.  RESP: CTA bilaterally.  Assessment   24 year old quadriplegic woman with #6 metal trach.  Plan   We will plan to see her in 3-6 months for trach change. We would like her   to follow up with pulmonology as well and will not decannulate unless they   feel that she can tolerate this. We encouraged them to call if they have   any questions or concerns.  Attestation   I examined the patient with the resident and agree with the assessment and   plan     Maurilio Lovely, M.D.  dictated by voice recognition software.  Signature   Electronically signed by: Reece Agar. Blair Promise  MD - Res.; 07/09/2010 12:52   PM EST.  Electronically signed by: Salvadore Farber  MD Attend.; 07/09/2010 1:01 PM EST.

## 2010-07-11 ENCOUNTER — Ambulatory Visit: Payer: Self-pay | Admitting: Physical Medicine and Rehabilitation

## 2010-07-27 NOTE — Progress Notes (Signed)
Message   Recorded as Task  Date: 07/02/2010 10:26 AM, Created By: Venita Sheffield  Task Name: Referral  Assigned To: Cindy Ochoa  Regarding Patient: Cindy Ochoa, Cindy Ochoa, Status: In Progress  CommentVenita Sheffield - 02 Jul 2010 10:26 AM    TASK CREATED  Dr. Kandice Ochoa asked that we work with this patient to get her home   services.  Apparently many nursing services have been involved in the past,   but basically stopped seeing her because of overbearing mother.  She is a   chronically ill woman trying to live alone, but has many issues and would   really benefit from in-house help.  Any help you can provide would be most   appreciated.  I will also task Cindy Ochoa as she follows up with patient   most frequently.  Cindy Ochoa - 02 Jul 2010 4:58 PM    TASK REPLIED TO: Previously Assigned To Cindy Ochoa   I spoke wih the Cindy Ochoa agency who most recently provided care to Cindy Ochoa.   They closed her case because she refused three visits.    They also noted that Cindy Ochoa told them that she was going to arrange to   have her trach removed and would then no longer need a private duty nurse .  Additionally they indicate that the  [place] has no plans to reopen her   consumer directed care case because of issues that  they had previously.    When Cindy Ochoa is back next week i will talk with her about options   but I suspect that we will not be able to find an agency to open her case   as she has a  negative history with most of them :(  Cindy Ochoa - 02 Jul 2010 4:59 PM    TASK REPLIED TO: Previously Assigned To Cindy Ochoa  Thanks so much for your help.  Please inform Dr. Kandice Ochoa in pulmonary.    Cindy Ochoa - 10 Jul 2010 1:48 PM    TASK EDITED   Just to f/u regarding this patient   She has been deemed by all Cindy Ochoa agencies as not having a safe home plan and   no one has been willing to take her case on.   This has been an on going issues with multiple person attempting to  help   without success.    I do not know what can be done that has not already been tried.  Cindy Ochoa - 15 Jul 2010 9:55 AM    TASK IN PROGRESS  Cindy Ochoa - 25 Jul 2010 12:26 PM    TASK REPLIED TO: Previously Assigned To Cindy Ochoa  I am at a lose too.  I do not think it is safe to decannulate her because   of her weakness.  She recently moved out of her mother's house hoping that   would make it a bit easier for her to get home care; she indicates the   issues have been with her mother and not with her ... ??.  Social Work Contact   PATIENT NAMEKHORI Ochoa  MRN #: [id number]  Address: [address]  [place], [place] [address]                 ADDITIONAL INFORMATION:      Patient is in a tough position.      She has no home care agency involved with her and has been to  my   knowledge flagged as a high risk case by [facility] agencies   due to her poor back up plan and difficulties with her mother.      [facility] has declined to provide waiver and the  [employer] has also declined to work with her.        PLAN:  -- Clinical research associate has no suggestions at this time.  Signature   Electronically signed byMarlan Ochoa  C.S.W.; 07/27/2010 7:48 AM EST;   Chartered loss adjuster.

## 2010-07-28 NOTE — Progress Notes (Addendum)
Message   Recorded as Task  Date: 07/24/2010 01:11 PM, Created By: Livia Snellen  Task Name: Letter/Form Request  Assigned To: AMB MED SOCIAL WORKER  Regarding Patient: Cindy Ochoa, Cindy Ochoa, Status: Active  CommentLivia Snellen - 24 Jul 2010 1:11 PM    TASK CREATED  Please review pt's problem list to determine whether there is a medical   necessity for her to have air conditiioning at home. Please let me know   your findings as pt wanting to apply for a free a/c unit. Thank you.  HENRY,SUSAN - 24 Jul 2010 1:33 PM    TASK REASSIGNED: Previously Assigned To AMB MED-MARTIN TEAM  [facility] - 24 Jul 2010 9:45 PM    TASK REASSIGNED: Previously Assigned To [facility]  Jacki Cones the letter is written for Cindy Ochoa, Cindy Ochoa - 25 Jul 2010 8:45 AM    TASK REASSIGNED: Previously Assigned To [facility]B MED-MARTIN Lamar Benes - 25 Jul 2010 9:16 AM    TASK EDITED  Patient is asking to have the letter mailed to her home please. the address   is current in the patients file. Thanks.  Jude Linck - 27 Jul 2010 8:10 AM    TASK REASSIGNED: Previously Assigned To AMB MED SOCIAL WORKER   She has not seen an MD in a long time so I have 2 ?s for you    1) does she meet criteria for air conditioner?     2) If she meets criteria which MD should I have sign off on letter since   Dr. Ike Bene for a couple of weeks?  [facility] - 27 Jul 2010 3:45 PM    TASK REPLIED TO: Previously Assigned To [facility]  I signed her letter that should be good she has been admitted twice in past   year...she is also followed by pulmonary...thanks.  Recorded as Task  Date: 07/24/2010 10:13 AM, Created By: Jennette Kettle  Task Name: Letter/Form Request  Assigned To: Livia Snellen  Regarding Patient: Cindy Ochoa, Status: In Progress  CommentJennette Kettle - 24 Jul 2010 10:13 AM    TASK CREATED  Caller: [name], Parent; Other  Calling to speak to Acquanetta Chain about an air conditioner that requires a   letter from the doctor stating patient has a  condition that calls for this   equipment. Please call [phone].  Crandall,Diane - 24 Jul 2010 10:20 AM    TASK REASSIGNED: Previously Assigned To AMB MED-MARTIN TEAM  [facility] - 24 Jul 2010 12:23 PM    TASK REASSIGNED: Previously Assigned To AMB MED SOCIAL Dorris Carnes - 24 Jul 2010 1:12 PM    TASK EDITED  Forwarded request to Martin's team to determine pt's eligibility.  [facility] - 24 Jul 2010 4:21 PM    TASK IN PROGRESS Amended: Gerrick Ray ; 07/28/2010 11:36 AM EST.  Social Work Contact   PATIENT NAME:  Cindy Ochoa  MRN #: [id number]  Address: [address])  [place], [place] [address]                 ACTION TAKEN:      Letter signed by Acquanetta Chain, NP and Dr. Amador Cunas.      Per patient request letter sent to patient's home via [place] mail this   morning.      PLAN:  -- As noted above  --Patient with Social Work contact information and Social Work to remain   available for assistance.  Signature   Electronically signed by:  Cliffie Gingras  C.S.W.; 07/28/2010 11:33 AM EST;   Chartered loss adjuster.  Electronically signed by: Marlan Palau  C.S.W.; 07/28/2010 11:36 AM EST;   Chartered loss adjuster.

## 2010-07-31 NOTE — Progress Notes (Signed)
Message   Recorded as Task  Date: 07/30/2010 01:11 PM, Created By: Jesse Fall  Task Name: Letter/Form Request  Assigned To: AMB MED SOCIAL WORKER  Regarding Patient: [name], [name], Status: In Progress  CommentJesse Fall - 30 Jul 2010 1:11 PM    TASK CREATED     pt needs the doctor to fax [employer] a letter stating not to turn off her gas   and electric They ([employer]) state it will be turned off on 08/01/10. 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   If you have any question please give the pt a call back [phone]  Crandall,Diane - 30 Jul 2010 1:21 PM    TASK REASSIGNED: Previously Assigned To AMB MED-MARTIN TEAM  Dannis Deroche - 30 Jul 2010 4:48 PM    TASK EDITED     Voice mail msg left for patient at number noted in allscripts [phone]   Request for patient to call writer  Zaynab Chipman - 30 Jul 2010 4:48 PM    TASK IN PROGRESS  RUIZ,AIDA - 31 Jul 2010 9:49 AM    TASK EDITED  patient is returning call  Lore Polka - 31 Jul 2010 12:20 PM    TASK IN PROGRESS.  Social Work Contact   PATIENT NAME:  Cindy Ochoa  MRN #: [id number]  Address: [address]  ([address])  [place], [place] [address]                 ACTION TAKEN:       Writer spoke with patient and was given permission to speak with   [employer].       Writer spoke with [employer] regarding patient's account ([phone]).   Linda with the [facility] at [facility] indicates that patient is   not at risk of having service terminated as her account has been flagged as   a life support case. This means that they can not terminate service but it   is the expectation that patient continue to pay on the account.       Writer contacted patient and advised her that her account ad been   flagged and that she was not at risk of her services being terminated.   Writer did stress to her that it was critical that she make every attempt   to pay on this account each month.         Additionally Clinical research associate reviewed with patient the ways she cold  cut down   on her monthly bills. Write r advised patient that she could look at having   basic phone services that would only cost her about $16.00 per month.     PLAN:  --As noted above.  --Patient with Social Work contact information and Social Work to remain   available for assistance.  Signature   Electronically signed byMarlan Palau  C.S.W.; 07/31/2010 2:49 PM EST;   Chartered loss adjuster.

## 2010-08-06 ENCOUNTER — Ambulatory Visit: Payer: Self-pay | Admitting: Surgery

## 2010-08-07 ENCOUNTER — Ambulatory Visit: Payer: Self-pay | Admitting: Dermatology

## 2010-08-12 ENCOUNTER — Ambulatory Visit: Payer: Self-pay | Admitting: Surgery

## 2010-09-11 ENCOUNTER — Other Ambulatory Visit: Payer: Self-pay | Admitting: Pulmonology

## 2010-10-01 ENCOUNTER — Ambulatory Visit: Payer: Self-pay
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
## 2010-10-21 ENCOUNTER — Other Ambulatory Visit: Payer: Self-pay | Admitting: Pulmonology

## 2010-10-24 ENCOUNTER — Ambulatory Visit: Payer: Self-pay | Admitting: Family

## 2010-10-24 ENCOUNTER — Ambulatory Visit
Admission: RE | Admit: 2010-10-24 | Discharge: 2010-10-24 | Disposition: A | Discharge status: Home / Self Care | Payer: Self-pay | Source: Ambulatory Visit | Attending: Pulmonology | Admitting: Pulmonology

## 2010-10-24 ENCOUNTER — Ambulatory Visit: Payer: Self-pay | Admitting: Surgery

## 2010-10-24 DIAGNOSIS — N390 Urinary tract infection, site not specified: Secondary | ICD-10-CM

## 2010-10-24 NOTE — Progress Notes (Signed)
 [facility] Follow-up Note   Chief Complaint   Left ischial pressure wound check     HPI   Cindy Ochoa is a 24 year old paraplegic female being seen today for   management of a left ischial pressure wound. Mother has been applying   DuoDerm dressings daily due to incontinence. She is also applying full   strength vinegar to the skin to protect from the urine. Patient continues   to live at her own apartment with mother visiting daily      Allergies   Heparin's; Thrombocytopenia  Latex-asked/denied  Nitrofurantoin CAPS; Vomiting.     Medication list was reviewed and reconciled     Pain Assessment   SUBJECTIVE PAIN ASSESSMENT:  Numeric/Verbal     PAIN: Patient denies pain in the last week     ROS   CONSTITUTIONAL: Sweats from autonomic dystrophy. Appetite good, no  weight   loss. Denies fevers or chills.   CV: No chest pain, shortness of breath or peripheral edema  RESPIRATORY: Tracheostomy.   GI: Incontinent on bowel regimen. No nausea/vomiting, abdominal pain  GU: Foley catheter that is leaking. Her urine is clear and dark yellow.    NEURO: Tetraplegia. Autonomic dysreflexia.   MUSCULOSKELETAL: Non-ambulatory with contracture's of arms and legs.     Wound Assessment:          --Location:  right ischium       healed             --Location:  left ischium      healed     Minor skin breakdown on buttocks. No dermatitis. Dry, flaking skin     Assessment   IMPRESSION: Healed pressure wounds     Plan   TREATMENT PLAN:   Discontinue dressings. Use Calmoseptine ointment on the   buttocks skin to protect from urine. Discontinue the use of vinegar as this   would cause acid/base imbalance in the skin. Patient and mother agreeable   with the plan of care.      FOLLOW-UP:   3 months as needed  .  Vital Signs   Recorded by [facility] on 24 Oct 2010 09:19 AM  BP:100/67,   HR: 84 b/min,   Resp: 18 r/min,   Temp: 96.7 F,   Pain Scale: 0,   O2 Sat: 100 (%SpO2).  Signature   Electronically signed by: Rise Mu  N.P.;  10/24/2010 10:27 AM EST.

## 2010-10-27 ENCOUNTER — Other Ambulatory Visit: Payer: Self-pay

## 2010-10-27 ENCOUNTER — Ambulatory Visit: Payer: Self-pay

## 2010-10-27 NOTE — Progress Notes (Addendum)
 Otolaryngology   Dear Dr. Andrey Campanile:     We had the pleasure of seeing your patient, Cindy Ochoa, in the   Otolaryngology  Clinic on October 27, 2010.  She is a 24 year old female   being seen in follow up for trach maintenance. She continues to speak well   around the trach with the cap in place. She has continued to have some   secretions and required therapy for this at the end of August. Her symptoms   have since improved and her mother reports that her cough is also improved.   They are not needed to use much suctioning at home as [name] has been able   to clear her secretions. She was also recently told by her pulmonologist   that decannulation may never be possible. She continues to desire   decannulation, however. She is still struggling to find home care due to   the trach and her mother has been assisting her with trach care. No extra   trach was brought to today's visit.  Physical Exam   GEN: NAD, breathing comfortably, strong voice with occluding trach  OTO: EACs patent, TMs intact, no effusion  NOSE: no drainage, mucosa intact  ORAL CAVITY: no posterior nasal drainage, no lesions or masses, mucosa   intact  NECK: #6 metal trach in place with trach ties that are quite loose. No   crepitus, no drainage. Janina Mayo was changed to a new #6 Jackson metal trach   without issues. Stoma was patent, no granulation tissue. No bleeding.  .  Assessment   24 year old quadriplegic woman with #6 metal trach. The trach stoma is   healthy appearing.  Plan   We have again reviewed with Rene Kocher and her mother that if her pulmonologist   feels she is ready to be decannulated we will work with them to facilitate   this. [facility] will contact us after her next pulmonology appointment if   decannulation is feasible. We will defer to the pulmonologist to work with   the patient regarding plugging trials when they feel this would be safe   given her pulmonary status. We will plan to see her in 3-6 months for   another trach  change. A prescription for a #6 Jean Rosenthal trach was provided   and the patient instructed to bring this to her next appointment. The   importance of keeping trach ties tight was also reviewed. Her family will   contact us with any problems or concerns in the interim.     Sincerely,  .  Attestation   I examined the patient with the resident and agree with the assessment and   plan     Maurilio Lovely, M.D.  dictated by voice recognition software.  Signature   Electronically signed by: Lazarus Gowda  MD - Res.; 10/27/2010 12:59 PM   EST.  Electronically signed by: Salvadore Farber  MD Attend.; 10/27/2010 1:12 PM EST.

## 2010-10-29 ENCOUNTER — Ambulatory Visit: Payer: Self-pay | Admitting: Family

## 2010-11-07 ENCOUNTER — Ambulatory Visit: Payer: Self-pay | Admitting: Family

## 2010-11-24 ENCOUNTER — Ambulatory Visit: Payer: Self-pay | Admitting: Surgery

## 2010-12-01 ENCOUNTER — Ambulatory Visit: Payer: Self-pay | Admitting: Pulmonology

## 2010-12-08 ENCOUNTER — Ambulatory Visit: Payer: Self-pay
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
## 2010-12-31 ENCOUNTER — Ambulatory Visit: Payer: Self-pay | Admitting: Internal Medicine

## 2010-12-31 ENCOUNTER — Ambulatory Visit: Payer: Self-pay | Admitting: Pulmonary Disease

## 2011-01-02 LAB — POCT URINE PREGNANCY (LAB): Preg Test,UR POC: NEGATIVE m[IU]/mL
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
## 2011-01-04 NOTE — Procedures (Deleted)
 Procedure   OUT-PATIENT PHARYNGOGRAM     Ms. HORNE is a 25 year old female who was referred to the Department of   Speech Pathology at [facility] on   October 24, 2010 by Dr.   Maeola Harman for an out-patient pharyngogram to objectively view the   swallowing mechanism.   The patient was seen in conjunction with the   radiologist, Dr. Rosanne Gutting.  The patient was unaccompanied for this   study.  This study was completed impromptu by the Department of Speech   Pathology as the initial order was received for an esophogram.  This was   clarified and staffing was immediately provided for completion of the study.  Reason For Visit   Ms. Sivertson received a FEES by [facility] on October 01, 2010.    This study indicated her swallow was intact, however it was recommended   that she receive a pharyngogram to fully assess the physiology of her   swallow prior to possible removal of her trach.  PMH   Pneumonia 25 May 2005 (V12.61)  Pneumonia Jul 2006 (V12.61).  Allergies   Heparins; Thrombocytopenia  Latex-asked/denied  Nitrofurantoin CAPS; Vomiting.  Current Meds   Pulmozyme 1 MG/ML Solution;USE 1 UNIT DOSE 2 TIMES A DAY.; Rx  Non-medication order(s);# 18 French Catheters ( Balloon Style-latex only)    Diagnosis: Neurogenic  BladderDispense : 24; Rx  Non-medication order(s);Depend pull-up briefs. Size LargeUse  up to 5 times   daily; Rx  Non-medication order(s);Trach Care Kit (A4629)diagnosis V44.0; Rx  Non-medication order(s);Trach Tube CollarDiagnosis V44.0; Rx  Non-medication order(s);Long Q tipsDisp 1 boxIndication: Quadraplegic; Rx  Non-medication order(s);4x4 gauzesDisp # 5Indication: Quadraplegic; Rx  Calmoseptine 0.44-20.625 % Ointment;apply to buttocks daily and as needed;   Rx  1-Medication Reconciliation;....; Rx  Non-medication order(s);Cough assist machineUse BID and prn to clear   secretionsDiag:  paralysis; chronic trach; Rx  Non-medication order(s);Swallow evaluationDiag:   paralysis; chronic trach;   Rx  Bisac-Evac 10 MG Suppository;USE AS DIRECTED; Rx.  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  Oral Motor Exam   A brief oral motor exam revealed pink and moist, oral mucosa.  Dentition   was normal and in good condition.  Lingual/facial symmetry,  range of   motion, strength and endurance were normalwith some sluggishness noted with   ongoing speech. Volitional airway protective mechanisms ( i.e.,   cough/throat clear) were normal.  Volitional swallow was normal.  Vocal   quality was present with digital occlusion of her trach.  The patient was   compliant, oriented, and followed directions without difficulty.     She presented to the study without a trach tube plug.  Upon interview, Ms.   Meints indicates that she eats a regular diet at home and does not occlude   or use a device to occlude her trach during meals.  It was decided to   pursue this study with an open trach as this is her baseline performance   when home.  Physical Exam   The patient was seen for a Pharyngogram in conjunction with radiology.  Ms.  Loredo was seated upright in the pink flouro chair and viewed from the   Lateral plane.  The consistencies administered were as follows : puree,   thin (regular) liquids by cup, and straw andhard crunchy solids.       Results are as follows : the oral stage of the swallow was characterized by   appropriate oral containment, bolus formation and anterior to posterior   transport for all consistencies.  There were occasional instances of   diffuse stasis on the tongue base after  the swallow of which Ms. Finnan did   not always appear aware.  When cued to complete second volitional swallow   was able to clear this residue without difficulty.  The pharyngeal stage of   the swallow was routinely triggered at the level of the valleculae with   good epiglottic inversion for all consistencies.  Ms. Demarco was able to   maintain adequate airway protection without occlusion of her trach for this   study.  The esophageal stage was not assessed.  Compensatory strategies   were not necessary for this study.  Laryngeal penetration/aspiration was   not observed for any consistencies.  Assessment   IMPRESSION  Patient displays no  dysphagia during today's study.  Results of today's   study were reviewed with Ms. Wilcoxen.     RECOMMENDATIONS:  1. Diet : regular diet with regular liquids.  2. Would benefit from an occasional volitional dry swallow to aid in   clearing of any accumulation of stasis on the tongue base.  3. Though not within her current habits, Ms. Fester was recommended to   either occlude or use trach plugs provided at her FEES examination to   maintain a closed and more natural respiratory system during food and   liquid intake.     All of these recommendations were reviewed with Ms. Foushee and understanding   was verbalized.  She was also provided with my contact information should   she have any future questions.     Thank you for allowing me to participate in the care of Ms. KALIANA ALBINO.    Please contact me at [phone] with any questions or concerns.     Sincerely,  Signature   Electronically signed by: Rito Ehrlich  M.S.; 10/27/2010 8:11 AM EST.

## 2011-01-04 NOTE — Miscellaneous (Unsigned)
 Continuity of Care Record  Created: todo  From: Andrey Campanile  From:   From: [employer], EHR v10.2.7.53  To: Cindy Ochoa  Purpose: Patient Use;       Problems  Diagnosis: Tracheostomy Care Requirement (V55.0)   Diagnosis: Hypotension 2006 (458.9)   Diagnosis: Chronic Pain Jun 2006 (338.29)   Diagnosis: Depression Jun 2006 (311)   Diagnosis: Chronic Constipation May 2006 (564.00)   Diagnosis: Neurogenic Bladder May 2006 (596.54)   Diagnosis: Quadriparesis At C6 May 2006 (344.04)     Family History  Family history of Arthritis (V17.7)   Family history of Diabetes Mellitus (V18.0)   Family history of Stroke Syndrome (V17.1)     Social History  History of Sexually Active With Persons At Risk For HIV-related Disease   (V69.2)     Alerts  Allergy - Heparins Thrombocytopenia   Allergy - Latex-asked/denied   Allergy - Nitrofurantoin CAPS Vomiting     Medications  1-Medication Reconciliation; .... ; Rx   Bisacodyl 10 MG Suppository; INSERT 1 SUPP DAILY PRN constipation ; Rx   Calmoseptine 0.44-20.625 % Ointment; apply to buttocks daily and as needed   ; Rx   Docusate Sodium 100 MG Capsule; TAKE 1 CAPSULE TWICE DAILY ; Rx   Non-medication order(s); one hospital bed mattress for home useDiagnosis:   quadriparesis ; Rx   Non-medication order(s); # 18 French Catheters ( Balloon Style-latex only)    Diagnosis: Neurogenic  Bladder with incontinence. Dispense : 10 ; Rx   Non-medication order(s); Dispense 24 allevyn pads 5x5DX: decubiti ulcer ;   Rx   Non-medication order(s); Allevyn 5x5 inch apply daily to left hip wound ;   Rx   Non-medication order(s); #6 Jackson metal tracheostomy tube with inner   cannula and obturator ; Rx   Non-medication order(s); Swallow evaluationDiag:  paralysis; chronic trach   ; Rx   Non-medication order(s); Cough assist machineUse BID and prn to clear   secretionsDiag:  paralysis; chronic trach ; Rx   Non-medication order(s); Depend pull-up briefs. Size LargeUse   up to 5   times daily ; Rx   Non-medication order(s); Trach Care Kit (A4629)diagnosis V44.0 ; Rx   Non-medication order(s); Trach Tube CollarDiagnosis V44.0 ; Rx   Non-medication order(s); Long Q tipsDisp 1 boxIndication: Quadraplegic ; Rx   Non-medication order(s); 4x4 gauzesDisp # 5Indication: Quadraplegic ; Rx   Pulmozyme 1 MG/ML Solution; USE 1 UNIT DOSE 2 TIMES A DAY. ; Rx     Immunizations  Hep B (Recombinant)   * Influenza   Hep B (Recombinant); #2   Hep B (Recombinant); #1   H1N1 Influenza Inj   Influenza   Influenza
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
## 2011-01-05 ENCOUNTER — Ambulatory Visit: Payer: Self-pay

## 2011-01-20 ENCOUNTER — Ambulatory Visit: Payer: Self-pay

## 2011-01-20 ENCOUNTER — Encounter: Payer: Self-pay | Admitting: Otolaryngology

## 2011-01-20 NOTE — Progress Notes (Addendum)
Otolaryngology   Dear Dr. Andrey Campanile:     We had the pleasure of seeing your patient, Cindy Ochoa, in the   Otolaryngology  Clinic on January 20, 2011.  She is a 25 year old female   being seen in follow up for trach maintenance. She continues to speak well   around the trach and there have been no breathing difficulties. Taurus has   been able to clear her secretions. She is still struggling to find home   care agencies to assist in her care and her mother has been assisting her   with trach care. She reports some problems with rusting of the inner   cannula and has been not using the inner cannula for the past month. Her   mother was cleaning the tube with quarter strength hydrogen peroxide prior   to the rusting and is unsure what could've caused this. She denies fevers,   bleeding or pain in the peristomal region. She has another appointment with   her pulmonologist next month.  Physical Exam   GEN: NAD, breathing comfortably, strong voice with occlusion of trach  OTO: EACs patent, TMs intact, no effusion  NOSE: no drainage, mucosa intact  ORAL CAVITY: no posterior nasal drainage, no lesions or masses, mucosa   intact  NECK: #6 metal trach in place with trach ties that are again quite loose.   No crepitus, no drainage. No inner cannula in place. Janina Mayo was changed to a   new #6 Jackson metal trach without issues. Old tube with significant   crusting and debris. Stoma was patent, no granulation tissue. No bleeding.   Hypertrophic scars and some keloid formation at prior trach incision   lateral to stoma.   .  Assessment   25 year old quadriplegic woman with #6 metal trach. The trach stoma is   healthy appearing. Some difficulties with trach care including rusting of   inner cannula.  Plan   A new Jackson metal trach and trach ties were placed without difficulty.   The patient tolerated the procedure well. Unfortunately, she did not bring   her inner cannula with her today and reports that she threw away her  old   trach tube. We asked that she bring all used trach supplies back to our   office in the future so that they can be replaced. Tracheostomy care was   reviewed. We will plan to see her in 3-4 months for another trach change.   Her  #6 Jean Rosenthal trach will be re-sterilized and should be available for her   next appointment. The importance of keeping trach ties tight was also   reviewed again. Her family will contact us with any problems or concerns in   the interim.     Sincerely,  .  Attestation   I examined the patient with the resident and agree with the assessment and   plan     Maurilio Lovely, M.D.  dictated by voice recognition software.  Signature   Electronically signed by: Lazarus Gowda  MD - Res.; 01/20/2011 2:23 PM EST.  Electronically signed by: Salvadore Farber  MD Attend.; 01/20/2011 6:50 PM EST.

## 2011-02-10 ENCOUNTER — Ambulatory Visit
Admit: 2011-02-10 | Discharge: 2011-02-10 | Disposition: A | Discharge status: Home / Self Care | Payer: Self-pay | Source: Ambulatory Visit | Attending: Internal Medicine | Admitting: Internal Medicine

## 2011-02-23 ENCOUNTER — Ambulatory Visit: Payer: Self-pay

## 2011-03-06 ENCOUNTER — Encounter: Payer: Self-pay | Admitting: Internal Medicine

## 2011-03-06 ENCOUNTER — Ambulatory Visit: Payer: Self-pay | Admitting: Internal Medicine
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
## 2011-03-16 ENCOUNTER — Ambulatory Visit: Payer: Self-pay | Admitting: Obstetrics and Gynecology

## 2011-03-19 ENCOUNTER — Encounter: Payer: Self-pay | Admitting: Neurology

## 2011-03-19 ENCOUNTER — Ambulatory Visit: Payer: Self-pay | Admitting: Neurology
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
## 2011-03-25 ENCOUNTER — Ambulatory Visit: Payer: Self-pay | Admitting: Pulmonary Disease

## 2011-03-25 ENCOUNTER — Encounter: Payer: Self-pay | Admitting: Pulmonary Disease
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
## 2011-04-20 ENCOUNTER — Ambulatory Visit: Payer: Self-pay

## 2011-05-01 ENCOUNTER — Ambulatory Visit: Payer: Self-pay | Admitting: Obstetrics and Gynecology

## 2011-05-20 ENCOUNTER — Ambulatory Visit: Payer: Self-pay | Admitting: Physical Medicine and Rehabilitation

## 2011-05-25 ENCOUNTER — Encounter: Payer: Self-pay | Admitting: Emergency Medicine

## 2011-05-25 ENCOUNTER — Inpatient Hospital Stay
Admission: EM | Admit: 2011-05-25 | Disposition: A | Discharge status: Home / Self Care | Payer: Self-pay | Source: Ambulatory Visit | Attending: Internal Medicine | Admitting: Internal Medicine

## 2011-05-25 DIAGNOSIS — J189 Pneumonia, unspecified organism: Principal | ICD-10-CM

## 2011-05-25 HISTORY — DX: Pneumonia, unspecified organism: J18.9

## 2011-05-25 LAB — URINE MICROSCOPIC (IQ200)
RBC,UA: NONE SEEN /hpf (ref 0–2)
WBC,UA: 4 /hpf (ref 0–5)

## 2011-05-25 LAB — URINALYSIS REFLEX TO CULTURE
Blood,UA: NEGATIVE
Nitrite,UA: NEGATIVE
Protein,UA: 100 mg/dL — AB
Specific Gravity,UA: 1.01 (ref 1.002–1.030)
pH,UA: 9 — ABNORMAL HIGH (ref 5.0–8.0)

## 2011-05-25 LAB — CBC AND DIFFERENTIAL
Baso # K/uL: 0 10*3/uL (ref 0.0–0.1)
Basophil %: 0.2 % (ref 0.1–1.2)
Eos # K/uL: 0.1 10*3/uL (ref 0.0–0.4)
Eosinophil %: 0.7 % (ref 0.7–5.8)
Hematocrit: 39 % (ref 34–45)
Hemoglobin: 12.8 g/dL (ref 11.2–15.7)
Lymph # K/uL: 3.2 10*3/uL (ref 1.2–3.7)
Lymphocyte %: 38.5 % (ref 19.3–51.7)
MCV: 87 fL (ref 79–95)
Mono # K/uL: 0.8 10*3/uL (ref 0.2–0.9)
Monocyte %: 10.2 % (ref 4.7–12.5)
Neut # K/uL: 4.2 10*3/uL (ref 1.6–6.1)
Platelets: 85 10*3/uL — ABNORMAL LOW (ref 160–370)
RBC: 4.5 MIL/uL (ref 3.9–5.2)
RDW: 17.7 % — ABNORMAL HIGH (ref 11.7–14.4)
Seg Neut %: 50.4 % (ref 34.0–71.1)
WBC: 8.3 10*3/uL (ref 4.0–10.0)

## 2011-05-25 LAB — BASIC METABOLIC PANEL
Anion Gap: 13 (ref 7–16)
CO2: 19 mmol/L — ABNORMAL LOW (ref 20–28)
Calcium: 8.6 mg/dL — ABNORMAL LOW (ref 8.8–10.2)
Chloride: 107 mmol/L (ref 96–108)
Creatinine: 0.51 mg/dL (ref 0.51–0.95)
GFR,Black: >59 *
GFR,Caucasian: >59 *
Glucose: 64 mg/dL — ABNORMAL LOW (ref 74–106)
Lab: 8 mg/dL (ref 6–20)
Potassium: 3.5 mmol/L (ref 3.3–5.1)
Sodium: 139 mmol/L (ref 133–145)

## 2011-05-25 LAB — BHCG, QUANT PREGNANCY: BHCG, QUANT PREGNANCY: <1 m[IU]/mL (ref 0–1)

## 2011-05-25 LAB — HIV-1/2 RAPID SCREEN AB - MEDICALLY URGENT: Rapid HIV 1&2: NEGATIVE

## 2011-05-25 LAB — S. PNEUMONIAE ANTIGEN: S. pneumoniae Antigen: 0

## 2011-05-25 LAB — MRSA (ORSA) AMPLIFICATION: MRSA (ORSA) Amplification: 0

## 2011-05-25 LAB — LEGIONELLA ANTIGEN, URINE: Legionella Antigen (Urine): 0

## 2011-05-25 LAB — GRAM STAIN

## 2011-05-25 MED ORDER — CEFTRIAXONE SODIUM 1 GM IV/IJ SOLR *WRAPPED*
1.0000 g | Freq: Once | INTRAMUSCULAR | Status: AC
Start: 2011-05-25 — End: 2011-05-25
  Filled 2011-05-25: qty 1

## 2011-05-25 MED ORDER — BISACODYL 10 MG RE SUPP *I*
10.0000 mg | RECTAL | Status: DC
Start: 2011-05-25 — End: 2011-05-29

## 2011-05-25 MED ORDER — ALBUTEROL SULFATE (2.5 MG/3ML) 0.083% IN NEBU *I*
2.5000 mg | INHALATION_SOLUTION | Freq: Once | RESPIRATORY_TRACT | Status: AC
Start: 2011-05-25 — End: 2011-05-25

## 2011-05-25 MED ORDER — SODIUM CHLORIDE 0.9 % IV SOLN WRAPPED *I*
100.0000 mL/h | Status: DC
Start: 2011-05-25 — End: 2011-05-27

## 2011-05-25 MED ORDER — AZITHROMYCIN 500MG IN 280ML D5W *I*
500.0000 mg | Freq: Once | INTRAVENOUS | Status: AC
Start: 2011-05-25 — End: 2011-05-25
  Filled 2011-05-25: qty 250

## 2011-05-25 MED ORDER — ALBUTEROL SULFATE (2.5 MG/3ML) 0.083% IN NEBU *I*
INHALATION_SOLUTION | RESPIRATORY_TRACT | Status: AC
Start: 2011-05-25 — End: 2011-05-25
  Filled 2011-05-25: qty 3

## 2011-05-25 MED ORDER — SODIUM CHLORIDE 0.9 % IV SOLN WRAPPED *I*
75.0000 mL/h | Status: DC
Start: 2011-05-25 — End: 2011-05-25

## 2011-05-25 MED ORDER — PIPERACILLIN-TAZOBACTAM IN D5W 4.5 GM/100ML IV SOLN *I*
4.5000 g | Freq: Four times a day (QID) | INTRAVENOUS | Status: DC
Start: 2011-05-25 — End: 2011-05-29
  Filled 2011-05-25 (×16): qty 100

## 2011-05-25 MED ORDER — AZITHROMYCIN 250 MG PO TABS *I*
250.0000 mg | ORAL_TABLET | Freq: Every day | ORAL | Status: DC
Start: 2011-05-26 — End: 2011-05-29
  Filled 2011-05-25 (×3): qty 1

## 2011-05-25 MED ORDER — ACETAMINOPHEN 325 MG PO TABS *I*
650.0000 mg | ORAL_TABLET | Freq: Three times a day (TID) | ORAL | Status: DC | PRN
Start: 2011-05-25 — End: 2011-05-29
  Filled 2011-05-25: qty 2

## 2011-05-25 MED ADMIN — Ceftriaxone Sodium For Inj 1 GM: 1 g | INTRAVENOUS | NDC 00409733201

## 2011-05-25 MED ADMIN — Azithromycin 500mg IV: 500 mg | INTRAVENOUS | NDC 40809942602

## 2011-05-25 MED ADMIN — Sodium Chloride IV Soln 0.9%: 100 mL/h | INTRAVENOUS | NDC 00338004904

## 2011-05-25 MED ADMIN — Albuterol Sulfate Soln Nebu 0.083% (2.5 MG/3ML): 2.5 mg | RESPIRATORY_TRACT | NDC 00487950101

## 2011-05-25 MED ADMIN — Piperacillin Sod-Tazobactam Sod in Dex IV Soln 4-0.5GM/100ML: 4.5 g | INTRAVENOUS | NDC 00206886201

## 2011-05-25 MED ADMIN — Sodium Chloride IV Soln 0.9%: 75 mL/h | INTRAVENOUS | NDC 00338004904

## 2011-05-25 NOTE — ED Notes (Signed)
 Suctioned patient's trach per patient request.  Thick sputum returned and patient reports being able to breathe better s/p suctioning

## 2011-05-25 NOTE — ED Notes (Signed)
 Pt has a trach, has a productive cough since saturday

## 2011-05-25 NOTE — ED Notes (Signed)
 Offered a pillow to reposition patient - patient refused.  Heels elevated.  Informed patient on importance of frequent repositioning.

## 2011-05-25 NOTE — ED Notes (Signed)
 Patient to 1L via EMS for complaints of a cough since Saturday.  Patient reports coughing up blood as well. Patient has a trach - states she has not been suctioned in over 1 year.  Patient states she is a quadriplegic - does have motion of her b/l arms, but no gross motor skills in hands.  Patient denies CP/SOB, denies N/V/D, denies fevers/chills.  Patient and mother deny any skin breakdown on the patient.  Will continue to monitor and treat as ordered.
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
## 2011-05-25 NOTE — ED Provider Notes (Addendum)
 History     Chief Complaint   Patient presents with   . Cough     HPI Comments: 25 yo woman with trach, C6 quadriplegia presents with cough.  She has had several days of cough productive for blood-streaked sputum.  No fever/chills.  No shortness of breath or chest pain.  She also reports fatigue, generalized malaise and poor po intake over the past few days.    She is accompanied by her mother who reports LMP more than 2 months ago and is concerned about pregnancy.      The history is provided by the patient.       Past Medical History   Diagnosis Date   . Pneumonia      Conversion Data - [name]   . Quadriplegia      s/p C6 fracture       Past Surgical History   Procedure Date   . Converted procedure      Open Treatment Of Single Cervical Vertebral Body Fracture Conversion Data    . Converted procedure      Tracheostomy Conversion Data    . Gastrostomy tube placement      Percutaneous Placement Of Gastrostomy Tube Conversion Data    . Converted procedure      Closed Treatment Of Orbital Fracture (Non-Blowout) Conversion Data    . Converted procedure      Closed Treatment Of Skull Fracture (Non-operative) Conversion Data    . Converted procedure      Radiologic Supervision: [facility] visitor In IVC Conversion Data        Family History   Problem Relation Age of Onset   . Conversion Other      [id number] Syndrome^436^Active^   . Conversion Other      L4663738.7^Active^   . Conversion Other      [id number]^FAOZHYQM Mellitus^250.00^Active^       Social History      does not have a smoking history on file. She does not have any smokeless tobacco history on file. Her alcohol, drug, and sexual activity histories not on file.    Living Situation     Questions Responses    Patient lives with Family    Homeless No    Caregiver for other family member     External Services     Employment     Domestic Violence Risk           Review of Systems   Review of Systems   Constitutional: Negative for  fever.   HENT: Negative for neck pain.    Respiratory:        [See HPI  Cardiovascular: Negative for chest pain.   Gastrointestinal: Negative for abdominal pain.   Genitourinary:        [amenorrhea   Musculoskeletal: Negative for back pain.   Skin: Negative for rash.   Neurological:        [C6 quadriplegic  Psychiatric/Behavioral: Negative for confusion.       Physical Exam   BP 157/106  Pulse 98  Temp 36.2 C (97.2 F)  Resp 20  SpO2 100%    Physical Exam   [vitalsreviewed.  Constitutional: She appears well-developed and well-nourished.   HENT:   Head: Normocephalic.   Mouth/Throat: Oropharynx is clear and moist.        Trach in place   Eyes: Conjunctivae are normal. Pupils are equal, round, and reactive to light.   Neck: Neck supple.   Cardiovascular: Normal rate, regular rhythm  and normal heart sounds.    Pulmonary/Chest: Effort normal.        Diffuse bilateral rhonchi   Abdominal: Soft. Bowel sounds are normal. She exhibits no distension. There is no tenderness.   Neurological: She is alert.   Skin: Skin is warm and dry. No rash noted.       Medical Decision Making   MDM  Number of Diagnoses or Management Options  Diagnosis management comments: Patient seen by me today, 05/25/2011 at 12:43 PM    Assessment:  25 y.o., female comes to the ED with cough with sputum production  Differential Diagnosis includes bronchitis, PNA  Plan: CXR, nebulizer treatment, cbc, chem 8, blood cultures          Verlin Grills, MD    Verlin Grills, MD  05/25/11 1327    Patient seen by me today, 05/25/2011 at 12:43pm    History:   I reviewed this patient, reviewed the resident note and agree  Exam:   I examined this patient, reviewed the resident note and agree    Decision Making:   I discussed with the documented resident decision making  and agree        Patient in ed with cough productive of sputum, rhonchorous will rule out for pneumonia.    Author Verlin Grills, MD      Verlin Grills, MD  05/25/11 [phone]

## 2011-05-25 NOTE — ED Notes (Signed)
 Patient to xray.

## 2011-05-25 NOTE — ED Notes (Signed)
Report called to [id number].

## 2011-05-25 NOTE — ED Notes (Signed)
 Patient's mother noted to push on the patient's stomach in order to bring up sputum.

## 2011-05-25 NOTE — ED Notes (Signed)
 Medicine team at bedside

## 2011-05-26 ENCOUNTER — Encounter: Payer: Self-pay | Admitting: Internal Medicine

## 2011-05-26 LAB — CBC AND DIFFERENTIAL
Baso # K/uL: 0 10*3/uL (ref 0.0–0.1)
Basophil %: 0.1 % (ref 0.1–1.2)
Eos # K/uL: 0.1 10*3/uL (ref 0.0–0.4)
Eosinophil %: 1.8 % (ref 0.7–5.8)
Hematocrit: 38 % (ref 34–45)
Hemoglobin: 12 g/dL (ref 11.2–15.7)
Lymph # K/uL: 3.5 10*3/uL (ref 1.2–3.7)
Lymphocyte %: 44.1 % (ref 19.3–51.7)
MCV: 89 fL (ref 79–95)
Mono # K/uL: 0.8 10*3/uL (ref 0.2–0.9)
Monocyte %: 9.5 % (ref 4.7–12.5)
Neut # K/uL: 3.5 10*3/uL (ref 1.6–6.1)
Platelets: 78 10*3/uL — ABNORMAL LOW (ref 160–370)
RBC: 4.3 MIL/uL (ref 3.9–5.2)
RDW: 18 % — ABNORMAL HIGH (ref 11.7–14.4)
Seg Neut %: 44.5 % (ref 34.0–71.1)
WBC: 7.9 10*3/uL (ref 4.0–10.0)

## 2011-05-26 LAB — BASIC METABOLIC PANEL
Anion Gap: 12 (ref 7–16)
CO2: 21 mmol/L (ref 20–28)
Calcium: 9 mg/dL (ref 8.8–10.2)
Chloride: 109 mmol/L — ABNORMAL HIGH (ref 96–108)
Creatinine: 0.66 mg/dL (ref 0.51–0.95)
GFR,Black: >59 *
GFR,Caucasian: >59 *
Glucose: 112 mg/dL — ABNORMAL HIGH (ref 74–106)
Lab: 12 mg/dL (ref 6–20)
Potassium: 3.4 mmol/L (ref 3.3–5.1)
Sodium: 142 mmol/L (ref 133–145)

## 2011-05-26 MED ADMIN — Piperacillin Sod-Tazobactam Sod in Dex IV Soln 4-0.5GM/100ML: 4.5 g | INTRAVENOUS | NDC 00206886201

## 2011-05-26 MED ADMIN — Azithromycin Tab 250 MG: 250 mg | ORAL | NDC 64679096101

## 2011-05-26 MED ADMIN — Sodium Chloride IV Soln 0.9%: 100 mL/h | INTRAVENOUS | NDC 00338004904

## 2011-05-26 MED ADMIN — Acetaminophen Tab 325 MG: 650 mg | ORAL | NDC 00904198261

## 2011-05-26 NOTE — Progress Notes (Signed)
 Hospital Med Discharge Liaison HM1-patient seen,chart review done.Patient reports her mother lives with her and provides assist/care.Patient tells me she has not had home care-[facility] in 21yrs-they closed her case?Patient very much would like home care if possible and agree patient would benefit from nurse follow up since here with pneumonia.Re-choiced back to [facility]-per Debbie for [facility] patient is "flagged" and not accepted for service-[facility] to put in chart note.Called [facility][Anita] and she will review to see if they can home eval patient for follow up.Patient is [facility] patient. Patient very pleasant,cooperative.Will follow [phone]

## 2011-05-26 NOTE — Progress Notes (Signed)
 Patient choiced to [facility] for homecare services by Gladys Damme, [phone].  [facility] will be unable to provide homecare services.  Thank you. Princella Pellegrini RN [facility] [phone].

## 2011-05-26 NOTE — Progress Notes (Signed)
 Utilization Management    Level of Care Inpatient as of the date 05.28.12      Doy Mince, [place]     Pager: [phone]

## 2011-05-26 NOTE — Discharge Instructions (Signed)
Active Hospital Problems   Diagnoses   . Pneumonia, organism unspecified   . Quadriparesis At C6        . Neurogenic Bladder           Resolved Hospital Problems   Diagnoses     Brief Summary of Your Hospital Course (including key procedures and diagnostic test results):  You were admitted to [facility] with pneumonia and started on IV antibiotics. A sputum culture was taken which showed evidence of bacteria that was susceptible to the antibiotics. We started you on oral medications and you were considered well enough to be discharged home with close outpatient follow up. Your foley catheter was changed as well.    Your instructions:  Take your medications as instructed and follow up with your medical providers as scheduled, below    Recommended diet: regular diet    Recommended activity: activity as tolerated    Wound Care: none needed    If you experience any of these symptoms within the first 24 hours after discharge:Uncontrolled pain, Chest pain, Shortness of breath, Fever of 101 F. or greater, Poor feeding, Nausea or New or worsening headache  please follow up with the discharge attending Dr Brooke Dare at phone-number: [phone]    If you experience any of these symptoms 24 hours or more after discharge:Uncontrolled pain, Chest pain, Shortness of breath, Fever of 101 F. or greater, Poor feeding, Poor urinary output, Vomiting, Diarrhea, Loss of consciousness, New or worsening headache or Weakness  please follow up with your PCP:  Andrey Campanile, MD, MD [phone]

## 2011-05-26 NOTE — Progress Notes (Signed)
 Trach care completed. Also suctioned per patient request.

## 2011-05-26 NOTE — Progress Notes (Addendum)
 HMD Attending Attestation  Impressions/Plan  I have reviewed and discussed the A/P by Dr.Montgomery, on morning rounds, and agree with the following additions/revisions:      CAP, suspect atypical given bloody sputum.    --would touch base with Dr. Adela Ports to help determine abx given complicated pulm hx.  Awaiting cx results.   --Can have trach change as outpatient.   --wean IVF for good po    Dispo: anticipate home next 1-2 days once abx choice made.   Additional supporting details and data   I have interviewed and examined the patient on morning rounds, reviewed history (HPI, meds, PMH, FMH, SH, and/or ROS) and physical exam by Dr.Montgomery, and personally reviewed all pertinent labs and imaging.  I have the following additions/revisions:   History: Feeling much better since yesterday.  Decreased secretions, no SOB, no F/C, no cough.    Physical Exam:   Temp:  [35.6 C (96.1 F)-36.7 C (98.1 F)] 35.6 C (96.1 F)  Heart Rate:  [78-107] 78   Resp:  [16-22] 18   BP: (108-145)/(56-80) 145/66 mmHg     Lungs: comfortable with nl WOB but diffuse exp rhonchi on exam.  No crackles or wheezes for me.  Good air flow throughout    Data:   I have reviewed all old records, specifically: medical chart    Reviewed micro data    Hospital Medicine Progress Note    Subjective:  Had some discomfort with suctioning o/n, otherwise denies any f/c/SOB/CP. Minimal PO intake    Objective:  Current:   Blood pressure 122/80, pulse 91, temperature 36.3 C (97.3 F), temperature source Temporal, resp. rate 16, height 1.626 m (5\' 4" ), weight 86.183 kg (190 lb), SpO2 93.00%.  24 Hr:  BP: (108-157)/(56-106)   Temp:  [35.6 C (96.1 F)-36.3 C (97.3 F)]   Temp src:  [-]   Heart Rate:  [87-107]   Resp:  [16-22]   SpO2:  [93 %-100 %]   Height:  [162.6 cm (5\' 4" )]   Weight:  [86.183 kg (190 lb)]   Gen: NAD lying in bed  Eyes: PERRLA, anicteric  HENT: supple, no LAD  Pulm: bronchial BS b/l, scattered occasional wheezes  Cardio: s1s2 no  m/r/g  Abd: +BS, soft, NTND no rebound or guarding  Ext: +pulses, no edema  Neuro: alert, oriented, appropriate; paraplegia     Lab, Micro, Imaging:     WBC 7.9, HCT 38, Cr 0.66 < 0.51, K 3.4      Sputum Gram stain: GNDiplococci and GPC in pairs/chains   BCx: NGTD    *chest Standard Frontal And Lateral Views    05/25/2011  IMPRESSION:  Increased interstitial markings in the left lower lobe  can represent pneumonia.   The consultation was reviewed and approved by an attending radiologist after exam interpretation with a radiologist in training or PA.     Assessment & Plan:  This is a case of CAP in 25 yo F with chorionic respiratory failure secondary to C6 facture    Active Hospital Problems   Diagnoses   . Pneumonia, organism unspecified. Afebrile, comfortable on IV Abx  - Gram stain with   - Will c/w IV Zosyn 4.5 gm  q6 hr, narrow pending culture data  - Azithromycin 250 mg x 4 more days  - Suctioning per respiratory therapy  - Will call ENT today re scheduled trach change  - CBC daily  - IVF while poor PO   . Quadriparesis At C6     -  low air loss mattress  - wound care  - Tylenol PRN   . Neurogenic Bladder     - Urinalysis contaminated, not overly infectious appearing  - f/u final Cx      Resolved Hospital Problems   Diagnoses       Discharge Plan:  In hospital for IV Abx, will f/u SW, plan on return home once medically able    Author: Sherlyn Lick, MD Date: 05/26/2011 Time: 7:18 AM

## 2011-05-27 LAB — BASIC METABOLIC PANEL
Anion Gap: 12 (ref 7–16)
CO2: 21 mmol/L (ref 20–28)
Calcium: 8.8 mg/dL (ref 8.8–10.2)
Chloride: 106 mmol/L (ref 96–108)
Creatinine: 0.62 mg/dL (ref 0.51–0.95)
GFR,Black: >59 *
GFR,Caucasian: >59 *
Glucose: 95 mg/dL (ref 74–106)
Lab: 8 mg/dL (ref 6–20)
Potassium: 3.6 mmol/L (ref 3.3–5.1)
Sodium: 139 mmol/L (ref 133–145)

## 2011-05-27 LAB — CBC AND DIFFERENTIAL
Baso # K/uL: 0 10*3/uL (ref 0.0–0.1)
Basophil %: 0.1 % (ref 0.1–1.2)
Eos # K/uL: 0.1 10*3/uL (ref 0.0–0.4)
Eosinophil %: 2 % (ref 0.7–5.8)
Hematocrit: 38 % (ref 34–45)
Hemoglobin: 12.4 g/dL (ref 11.2–15.7)
Lymph # K/uL: 3.3 10*3/uL (ref 1.2–3.7)
Lymphocyte %: 48.6 % (ref 19.3–51.7)
MCV: 87 fL (ref 79–95)
Mono # K/uL: 0.5 10*3/uL (ref 0.2–0.9)
Monocyte %: 7.3 % (ref 4.7–12.5)
Neut # K/uL: 2.9 10*3/uL (ref 1.6–6.1)
Platelets: 94 10*3/uL — ABNORMAL LOW (ref 160–370)
RBC: 4.4 MIL/uL (ref 3.9–5.2)
RDW: 17.6 % — ABNORMAL HIGH (ref 11.7–14.4)
Seg Neut %: 42 % (ref 34.0–71.1)
WBC: 6.9 10*3/uL (ref 4.0–10.0)

## 2011-05-27 LAB — AEROBIC CULTURE: Aerobic Culture: 0

## 2011-05-27 MED ORDER — BACLOFEN 10 MG PO TABS *I*
5.0000 mg | ORAL_TABLET | Freq: Three times a day (TID) | ORAL | Status: DC
Start: 2011-05-27 — End: 2011-05-27

## 2011-05-27 MED ORDER — BACLOFEN 10 MG PO TABS *I*
5.0000 mg | ORAL_TABLET | Freq: Three times a day (TID) | ORAL | Status: DC
Start: 2011-05-27 — End: 2011-05-29
  Administered 2011-05-27 – 2011-05-28 (×4): 5 mg via ORAL
  Filled 2011-05-27 (×6): qty 1

## 2011-05-27 MED ADMIN — Piperacillin Sod-Tazobactam Sod in Dex IV Soln 4-0.5GM/100ML: 4.5 g | INTRAVENOUS | NDC 00206886201

## 2011-05-27 MED ADMIN — Sodium Chloride IV Soln 0.9%: 100 mL/h | INTRAVENOUS | NDC 00338004904

## 2011-05-27 MED ADMIN — Azithromycin Tab 250 MG: 250 mg | ORAL | NDC 64679096101

## 2011-05-27 MED ADMIN — Piperacillin Sod-Tazobactam Sod in Dex IV Soln 4-0.5GM/100ML: 4.5 g | INTRAVENOUS | NDC 00206886202

## 2011-05-27 NOTE — Progress Notes (Addendum)
 HMD Attending Attestation  Impressions/Plan  I have reviewed and discussed the A/P by Dr.Montgomery, on morning rounds, and agree with the following additions/revisions:      Pneumonia, suspect atypical given hemoptysis: would discuss with micro if can speciate sputum.  If not, would favor narrowing to po abx tomorrow.  Given how well-appearing patient is, doubt pseudomonas. Could probably narrow to Levaquin (would also give atypical coverage)      Additional supporting details and data   I have interviewed and examined the patient on morning rounds, reviewed history (HPI, meds, PMH, FMH, SH, and/or ROS) and physical exam by Dr.Montgomery, and personally reviewed all pertinent labs and imaging.  I have the following additions/revisions:     Data:     I have reviewed all old records, specifically: medical chart    I obtained history from: patient's mom    I discussed case with: [facility] Medicine Progress Note    Subjective:  Reports sputum is now clear (no blood, no yellow/green coloration). +spasms in legs b/l "all night long" similar to at home. Is interested in trying something for relief. Denies any chest pain, SOB, fever/chills. Some PO    Objective:  Current:   Blood pressure 135/76, pulse 105, temperature 36 C (96.8 F), temperature source Temporal, resp. rate 16, height 1.626 m (5\' 4" ), weight 86.183 kg (190 lb), SpO2 96.00%.  24 Hr:  BP: (119-145)/(66-86)   Temp:  [35.5 C (95.9 F)-36.7 C (98.1 F)]   Temp src:  [-]   Heart Rate:  [77-105]   Resp:  [16-18]   SpO2:  [95 %-97 %]   Gen: mild distress lying in bed 2/2 LE spasms  Eyes: PERRLA, anicteric  HENT: supple, no LAD  Pulm: bronchial BS b/l, no wheezes or rales  Cardio: s1s2 no m/r/g  Abd: +BS, soft, NTND no rebound or guarding  Ext: +pulses, no edema  Neuro: AOx3, LE with extensor spasticity and active spasms    Lab, Micro, Imaging:     WBC 6.9, HCT 38, Cr 0.62 < 0.51, K 3.6      Sputum Gram stain: GNDiplococci and GPC in pairs/chains;  Legionella Cx pending   Urine Ag: Strep and Legionella negative   BCx: NGTD    *chest Standard Frontal And Lateral Views    05/25/2011  IMPRESSION:  Increased interstitial markings in the left lower lobe  can represent pneumonia.   The consultation was reviewed and approved by an attending radiologist after exam interpretation with a radiologist in training or PA.     Assessment & Plan:  This is a case of CAP in 25 yo F with chorionic respiratory failure secondary to C6 facture    Active Hospital Problems   Diagnoses   . Pneumonia, organism unspecified. Afebrile, comfortable on IV Abx  - Gram stain with GND and GPC, final speciation/sensitivities pending. Will f/u with Micro lab today  - Will c/w IV Zosyn 4.5 gm  q6 hr, narrow pending culture data (day 3)  - Azithromycin 250 mg PO (day 3/5)  - Will plan on having trach changed as outpatient  - CBC daily  - wean IVF as PO improves   . Quadriparesis At C6 with LE spasticity     - Followed by Neuro as outpatient, had recommended initiate Baclofen at last visit. Patient interested in starting today. Will start 5 mg PO TID, f/u for relief; titrate PRN  - wound care/low air loss mattress  - Tylenol  PRN   . Neurogenic Bladder     - Foley cath changed out yesterday      Resolved Hospital Problems   Diagnoses       Discharge Plan:  In hospital for IV Abx, will f/u SW, plan on return home once medically able    Author: Sherlyn Lick, MD Date: 05/27/2011 Time: 7:15 AM

## 2011-05-27 NOTE — Progress Notes (Signed)
 Received request of referral for patient homecare.  Patient and family is actually known to [facility] and has extensive history.  At this time [facility] is unable to set up safe POC for this patient in the home and must decline referral.

## 2011-05-28 ENCOUNTER — Encounter: Payer: Self-pay | Admitting: Gastroenterology

## 2011-05-28 LAB — CBC AND DIFFERENTIAL
Baso # K/uL: 0 10*3/uL (ref 0.0–0.1)
Basophil %: 0.3 % (ref 0.1–1.2)
Eos # K/uL: 0.2 10*3/uL (ref 0.0–0.4)
Eosinophil %: 2.6 % (ref 0.7–5.8)
Hematocrit: 39 % (ref 34–45)
Hemoglobin: 12.5 g/dL (ref 11.2–15.7)
Lymph # K/uL: 3.2 10*3/uL (ref 1.2–3.7)
Lymphocyte %: 51.9 % — ABNORMAL HIGH (ref 19.3–51.7)
MCV: 88 fL (ref 79–95)
Mono # K/uL: 0.5 10*3/uL (ref 0.2–0.9)
Monocyte %: 7.8 % (ref 4.7–12.5)
Neut # K/uL: 2.3 10*3/uL (ref 1.6–6.1)
Platelets: 96 10*3/uL — ABNORMAL LOW (ref 160–370)
RBC: 4.4 MIL/uL (ref 3.9–5.2)
RDW: 17.5 % — ABNORMAL HIGH (ref 11.7–14.4)
Seg Neut %: 37.4 % (ref 34.0–71.1)
WBC: 6.2 10*3/uL (ref 4.0–10.0)

## 2011-05-28 LAB — BASIC METABOLIC PANEL
Anion Gap: 10 (ref 7–16)
CO2: 21 mmol/L (ref 20–28)
Calcium: 9.4 mg/dL (ref 8.8–10.2)
Chloride: 108 mmol/L (ref 96–108)
Creatinine: 0.59 mg/dL (ref 0.51–0.95)
GFR,Black: >59 *
GFR,Caucasian: >59 *
Glucose: 93 mg/dL (ref 74–106)
Lab: 7 mg/dL (ref 6–20)
Potassium: 3.7 mmol/L (ref 3.3–5.1)
Sodium: 139 mmol/L (ref 133–145)

## 2011-05-28 MED ORDER — LEVOFLOXACIN 500 MG PO TABS *I*
500.0000 mg | ORAL_TABLET | Freq: Every day | ORAL | Status: DC
Start: 2011-05-28 — End: 2011-05-28

## 2011-05-28 MED ORDER — AZITHROMYCIN 250 MG PO TABS *I*
250.0000 mg | ORAL_TABLET | Freq: Every day | ORAL | Status: AC
Start: 2011-05-28 — End: 2011-05-30

## 2011-05-28 MED ORDER — PNEUMOCOCCAL VAC POLYVALENT 25 MCG/0.5ML INJ *WRAPPED*
0.5000 mL | INJECTION | INTRAMUSCULAR | Status: AC
Start: 2011-05-28 — End: 2011-05-28
  Administered 2011-05-28: 0.5 mL via INTRAMUSCULAR
  Filled 2011-05-28: qty 0.5

## 2011-05-28 MED ORDER — BACLOFEN 10 MG PO TABS *I*
5.0000 mg | ORAL_TABLET | Freq: Three times a day (TID) | ORAL | Status: AC
Start: 2011-05-28 — End: 2011-06-27

## 2011-05-28 MED ADMIN — Azithromycin Tab 250 MG: 250 mg | ORAL | NDC 64679096101

## 2011-05-28 MED ADMIN — Piperacillin Sod-Tazobactam Sod in Dex IV Soln 4-0.5GM/100ML: 4.5 g | INTRAVENOUS | NDC 00206886201
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
## 2011-05-28 NOTE — Progress Notes (Signed)
 Pt 3 am BP was 150/100, after recheck BP was 160/98. Writer called Hewitt Shorts, MD. Writer advised to recheck BP again at 0600. No new orders written at this time.   Earlean Shawl, RN

## 2011-05-28 NOTE — Progress Notes (Signed)
 SOCIAL WORK REFERRAL NOTE    Referred to patient to assist with discharge planning. Patient's presenting situation and hospital course discussed in Health Team Rounds. At this time it appears that patient will be discharged to home to be cared for by her mother.    I have spoken with patient's PRALID (TBI waiver) Service Coordinator, Waymon Budge [phone], [phone]), who informed me that she is involved as an advocate to help patient access CD-PAS (consumer-directed personal aide services) through the Department of [employer]. Ms Particia Nearing spoke with Lovett Calender at [facility] earlier today and has requested an expedited home visit and assessment for services. It isn't clear how soon this will happen.    I also spoke with Patient's mother [name] to confirm that she is home to receive patient (she is) and to inform her that patient's stretcher discharge transportation through [facility] will depart [facility] at about 4:00 PM today.    Have also made round trip wheelchair transport arrangements for patient's Monday (6/4) [facility] appointment. [facility] will pick patient up from her home at 2 PM and she will need to call them for pick-up for her return ride at the conclusion of her appointment. Patient has been informed of these arrangements.     Ned Grace, LMSW  [phone], [phone])

## 2011-05-28 NOTE — Progress Notes (Signed)
 Received request for referral for home care services through [facility] at discharge. Agency is unable to provide a safe home care plan currently and can not accept the referral.  Isac Caddy, RN/HCC  [phone]

## 2011-05-28 NOTE — Progress Notes (Signed)
 Suction kits and suction tube will be delivered  to patient's home this afternoon by vendor, [employer] [facility] [employer]. [employer] phone # [phone]. Patient's mother Cindy Ochoa  and patient  In agreeable with this arrangement. Patient's mother Cindy Ochoa phone #  [phone].

## 2011-05-28 NOTE — Progress Notes (Addendum)
 HMD Attending Attestation  Impressions/Plan  I have reviewed and discussed the A/P by Dr.Montgomery, on morning rounds, and agree with the following additions/revisions:     Dispo: Social issues well known to me--have had multiple team meetings in past and it was felt that Mother was appropriate as caregiver.  Luddie is well-taken care of clearly and I feel comfortable d/c her to home.  Appreciate SW assistance.    Additional supporting details and data   I have interviewed and examined the patient on morning rounds, reviewed history (HPI, meds, PMH, FMH, SH, and/or ROS) and physical exam by Dr.Montgomery, and personally reviewed all pertinent labs and imaging.  I have the following additions/revisions:     Physical Exam:   Temp:  [35.6 C (96.1 F)-36.4 C (97.5 F)] 35.6 C (96.1 F)  Heart Rate:  [59-73] 73   Resp:  [16] 16   BP: (125-160)/(64-100) 125/75 mmHg     Lungs: baseline, rhonchorous    Total time spent in patient care/coordination (> 50% coordination): > 30 minutes, email sent to PCP      Hospital Medicine Progress Note    Subjective:  Spasms slightly improved yesterday, decreased sputum, no blood. Afebrile, increased PO intake    Objective:  Current:   Blood pressure 125/75, pulse 73, temperature 35.6 C (96.1 F), temperature source Temporal, resp. rate 16, height 1.626 m (5\' 4" ), weight 91.173 kg (201 lb), SpO2 95.00%.  24 Hr:  BP: (123-160)/(64-100)   Temp:  [35.6 C (96.1 F)-36.8 C (98.2 F)]   Temp src:  [-]   Heart Rate:  [59-85]   Resp:  [16-18]   SpO2:  [93 %-98 %]   Weight:  [91.173 kg (201 lb)]   Gen: NAD lying in bed  Eyes: PERRLA, anicteric  HENT: supple, no LAD  Pulm: bronchial BS b/l, no wheezes appreciated  Cardio: s1s2 no m/r/g  Abd: +BS, soft, NTND no rebound or guarding  Ext: +pulses, no edema  Neuro: alert, oriented, appropriate; paraplegia     Lab, Micro, Imaging:     WBC 6.2, HCT 39, Cr 0.59, K 3.7     Sputum Gram stain: GNDiplococci and GPC in pairs/chains   Sputum Cx: normal  flora   BCx: NGTD    *chest Standard Frontal And Lateral Views    05/25/2011  IMPRESSION:  Increased interstitial markings in the left lower lobe  can represent pneumonia.   The consultation was reviewed and approved by an attending radiologist after exam interpretation with a radiologist in training or PA.     Assessment & Plan:  This is a case of CAP in 25 yo F with chorionic respiratory failure secondary to C6 facture    Active Hospital Problems   Diagnoses   . Pneumonia, organism unspecified. Afebrile, comfortable on IV Abx  - Sputum culture with normal flora, spoke with Micro lab yesterday, confirmed that no further speciation pending  -consider stop Zosyn today, send patient out on Azithromycin to complete course  - Suctioning per respiratory therapy  - f/u ENT as outpatient for trach change  - CBC daily   . 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 Neurogenic Bladder     - UCx NGTD  - chronic foley      Resolved Hospital Problems   Diagnoses       Discharge Plan:  Likely home today  on PO Abx    Author: Sherlyn Lick, MD Date: 05/28/2011 Time: 8:59 AM

## 2011-05-28 NOTE — Progress Notes (Signed)
 Pt BP at 0530 was 146/72.   Earlean Shawl, RN
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
## 2011-05-31 LAB — BLOOD CULTURE
Bacterial Blood Culture: 0
Bacterial Blood Culture: 0

## 2011-06-01 ENCOUNTER — Encounter: Payer: Self-pay | Admitting: Internal Medicine

## 2011-06-01 ENCOUNTER — Ambulatory Visit: Payer: Self-pay | Admitting: Internal Medicine

## 2011-06-01 VITALS — BP 110/68 | HR 96

## 2011-06-01 DIAGNOSIS — J189 Pneumonia, unspecified organism: Secondary | ICD-10-CM

## 2011-06-01 MED ORDER — GUAIFENESIN 600 MG PO TB12 *I*
600.0000 mg | ORAL_TABLET | Freq: Two times a day (BID) | ORAL | Status: DC
Start: 2011-06-01 — End: 2011-12-16
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
## 2011-06-01 NOTE — Patient Instructions (Addendum)
 Reduce baclofen to twice per day     Call if no improvement will need a stool sample

## 2011-06-02 LAB — LEGIONELLA CULTURE: Legionella Culture: 0

## 2011-06-03 ENCOUNTER — Ambulatory Visit: Payer: Self-pay | Admitting: Sleep Medicine

## 2011-06-03 VITALS — BP 90/53 | HR 68 | Ht 65.0 in | Wt 193.0 lb

## 2011-06-03 DIAGNOSIS — Z430 Encounter for attention to tracheostomy: Secondary | ICD-10-CM

## 2011-06-03 NOTE — Progress Notes (Signed)
 Subjective:       Cindy Ochoa is a 25 y.o. female who presents for routine trach change.  Patient is inquiring if her mother may be trained on changing trach so patient does not need to come to office as frequently.  Patient reports she typically leaves trach plugger during the day and will unplug it at night, however she misplaced her plug and has left it open around the clock for the past few weeks.  She is requesting a replacement.  Patient notes she was recently hospitalized from 05/25/11-05/28/11 with pneumonia.  Patient states she feels she is recovering well from this infection.  She estimates suctioning twice daily.  No pain or bleeding from peristomal region    Patient's medications, allergies, past medical, surgical, social and family histories were reviewed and updated as appropriate.    Current Outpatient Prescriptions on File Prior to Visit   Medication Sig Dispense Refill   . guaiFENesin (MUCINEX) 600 MG 12 hr tablet Take 1 tablet (600 mg total) by mouth 2 times daily   Swallow whole. Do not crush, break, or chew.  30 tablet  1   . baclofen (LIORESAL) 10 MG tablet Take 0.5 tablets (5 mg total) by mouth 3 times daily    45 tablet  0   . bisacodyl (DULCOLAX) 10 MG suppository INSERT 1 SUPP DAILY PRN constipation  30  3   . docusate sodium (COLACE) 100 MG capsule TAKE 1 CAPSULE TWICE DAILY  60  5     Review of Systems  Pertinent items are noted in HPI.       Objective:      BP 90/53  Pulse 68  Ht 1.651 m (5\' 5" )  Wt 87.544 kg (193 lb)  BMI 32.12 kg/m2    General:   alert, not in distress, wheelchair bound, no barriers to communication.   Head and Face:   no craniofacial deformities.   Neck:   #6 Jackson metal trach in place, trach ties found to be loose.  No crepitus, no drainage, no granulation tissue.  Janina Mayo was changed to new #6 Jackson metal trach.  No issues.  Secretions suctioned: clear.  Hypertrophic scars and some keloid formation at prior trach incision lateral to stoma.       Assessment:        25 y.o. female for routine care to trach.        Plan:      1. Exam findings reviewed with patient.  2. Encouraged patient to have mother come to next appointment so she can be taught how to do trach change.  3. Patient encouraged to wear trach ties more tightly.  4. Patient given new plug for trach.  4. Return for follow-up in 3 months, sooner if indicated.

## 2011-06-22 ENCOUNTER — Other Ambulatory Visit: Payer: Self-pay | Admitting: Internal Medicine

## 2011-06-22 MED ORDER — BISACODYL 10 MG RE SUPP *I*
10.0000 mg | Freq: Every day | RECTAL | Status: DC | PRN
Start: 2011-06-22 — End: 2012-01-12

## 2011-06-25 ENCOUNTER — Ambulatory Visit: Payer: Self-pay | Admitting: Obstetrics and Gynecology

## 2011-06-29 ENCOUNTER — Encounter: Payer: Self-pay | Admitting: Gastroenterology

## 2011-07-02 ENCOUNTER — Other Ambulatory Visit: Payer: Self-pay | Admitting: Internal Medicine

## 2011-07-02 MED ORDER — DISPOSABLE GLOVES MISC *A*
Status: DC
Start: 2011-07-02 — End: 2014-03-22

## 2011-07-02 NOTE — Telephone Encounter (Signed)
Pt states she needs prescription for gloves sent to [facility].

## 2011-07-14 ENCOUNTER — Telehealth: Payer: Self-pay | Admitting: Internal Medicine

## 2011-07-14 DIAGNOSIS — G8250 Quadriplegia, unspecified: Secondary | ICD-10-CM

## 2011-07-16 ENCOUNTER — Other Ambulatory Visit: Payer: Self-pay | Admitting: Internal Medicine

## 2011-07-16 DIAGNOSIS — G8250 Quadriplegia, unspecified: Secondary | ICD-10-CM

## 2011-07-16 MED ORDER — NON-SYSTEM MEDICATION *A*
Status: DC
Start: 2011-07-16 — End: 2017-11-05

## 2011-07-16 MED ORDER — NON-SYSTEM MEDICATION *A*
Status: DC
Start: 2011-07-15 — End: 2011-07-16

## 2011-07-16 MED ORDER — NON-SYSTEM MEDICATION *A*
Status: DC
Start: 2011-07-16 — End: 2011-07-16

## 2011-07-16 NOTE — Telephone Encounter (Signed)
Done.  Will print out and give to [facility]    Andrey Campanile, MD

## 2011-07-17 NOTE — Telephone Encounter (Signed)
The script for [employer] has been faxed and mailed to [facility].

## 2011-07-24 ENCOUNTER — Telehealth: Payer: Self-pay | Admitting: Internal Medicine

## 2011-07-24 NOTE — Telephone Encounter (Signed)
8/08 @ 3:30p with Dr Julian Reil

## 2011-07-24 NOTE — Telephone Encounter (Signed)
Pt would like to make an appointment for a check up. Please call [phone]

## 2011-07-29 ENCOUNTER — Telehealth: Payer: Self-pay | Admitting: Internal Medicine

## 2011-07-29 NOTE — Telephone Encounter (Signed)
[facility] called in to speak to someone regarding the paperwork regarding a lift, wanted to know if it was received. Please call Debbie back at [phone] ext [phone]. Thanks

## 2011-07-31 ENCOUNTER — Ambulatory Visit: Payer: Self-pay | Admitting: Physical Medicine and Rehabilitation

## 2011-07-31 ENCOUNTER — Encounter: Payer: Self-pay | Admitting: Physical Medicine and Rehabilitation

## 2011-07-31 VITALS — BP 101/60 | HR 87 | Temp 94.6°F | Ht 65.0 in | Wt 190.0 lb

## 2011-07-31 DIAGNOSIS — R32 Unspecified urinary incontinence: Secondary | ICD-10-CM

## 2011-07-31 DIAGNOSIS — N319 Neuromuscular dysfunction of bladder, unspecified: Secondary | ICD-10-CM

## 2011-07-31 DIAGNOSIS — N390 Urinary tract infection, site not specified: Secondary | ICD-10-CM

## 2011-07-31 MED ORDER — OXYBUTYNIN CHLORIDE 10 MG PO TB24 *I*
10.0000 mg | ORAL_TABLET | Freq: Every day | ORAL | Status: DC
Start: 2011-07-31 — End: 2011-08-03

## 2011-07-31 MED ORDER — SKIN PROTECTANTS, MISC. EX OINT
1.0000 | TOPICAL_OINTMENT | Freq: Two times a day (BID) | CUTANEOUS | Status: DC
Start: 2011-07-31 — End: 2011-08-13

## 2011-07-31 NOTE — Patient Instructions (Signed)
Keep catheter in place using a catheter secure.

## 2011-07-31 NOTE — Progress Notes (Signed)
[facility] Follow Up Visit    Cindy Ochoa  07/31/2011    Chief complaint : Urinary leakage present for 4 months.    HPI: 25 year old female with neurogenic bladder dysfunction managed with a Foley catheter.  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  Over the last several months, she has noted increasing urinary leakage, and spasms of the abdomen, bladder, and legs that are pushing the urethral catheter out of the bladder with the balloon still inflated.  This occurs 2-3 times weekly.  She's wearing briefs and frequently soaks those.  Her skin has remained intact.  Overall she has not been having UTIs, however she feels as though she has one now.  There are no fevers or chills, however she does have dark cloudy and malodorous urine.  In the past she is on baclofen for spasticity and oxybutynin for bladder spasms.  She's no longer taking either of these.  Changes since prior visit :   PMH - no change  PSH - no change  Current Outpatient Prescriptions on File Prior to Visit   Medication Sig Dispense Refill   . Non-System Medication One Nurse, adult  Diagnosis:  Quadriplegic ICD-9 344.04  1 each  0   . disposable gloves 1 box Disposable Medium size gloves  100 each  6   . bisacodyl (DULCOLAX) 10 MG suppository Place 1 suppository (10 mg total) rectally daily as needed for Constipation    12 suppository  5   . guaiFENesin (MUCINEX) 600 MG 12 hr tablet Take 1 tablet (600 mg total) by mouth 2 times daily   Swallow whole. Do not crush, break, or chew.  30 tablet  1   . docusate sodium (COLACE) 100 MG capsule TAKE 1 CAPSULE TWICE DAILY  60  5     Allergies   Allergen Reactions   . Food      Pt denies food allergy   . Heparin      Created by Conversion - Thrombocytopenia;    . Nitrofurantoin      Created by Conversion - Vomiting;    . No Known Latex Allergy      Created by Conversion - 0;      BP 101/60  Pulse 87  Temp(Src) 34.8 C (94.6 F) (Tympanic)  Ht 1.651 m (5\' 5" )   Wt 86.183 kg (190 lb)  BMI 31.62 kg/m2  :   General appearance - a young obese female seated in a wheelchair.  She was Michiel Sites lifted onto the exam table    Abdomen - soft, no masses, no hepatosplenomegaly, non-distended bladder, no inguinal hernia, no tenderness  *  Other physical exam -  upon transfer to the table, it was noted that the Foley catheter with a balloon still inflated within the urethra.  The skin in the perineal region is completely intact.  The urethra did not appear excessively large, however there was scar tissue from prior surgery.  Procedure: A 16 French catheter was placed in the bladder without difficulty.  A urine for culture and sensitivity was obtained.      Studies reviewed -  creatinine from May 2012 is 0.59.  There is no recent imaging of her upper tracts.      Diagnosis established -  neurogenic bladder dysfunction managed with Foley catheter, with frequent unplanned removal of the Foley with the balloon inflated.  It is unknown at this point if the urethra is competent.  Plan of care -  Cindy Ochoa tells me that she is no longer taking any of her anti-spasticity medications.  She also is no longer on any anticholinergics she will be seeing her PCP next week and plans to discuss resuming her anti-spasticity medications.  She is advised to followup with physical medicine and rehab as well for spasticity management.  We will restart oxybutynin long-acting 10 mg daily.  Urine culture and sensitivity sent today, and I will follow the culture data.  Order written for catheter secures, she and her caregiver were instructed on using these consistently to prevent any accidental tugging of the catheter on the urethra.  Cindy Ochoa will contact me after these changes are made to see if she has any improvement.   We'll followup in 6 months time with renal ultrasound to evaluate for any hydronephrosis or renal or bladder calculus.  She may be seen sooner than 6 months if she has no improvement in urine  leakage.

## 2011-08-02 ENCOUNTER — Encounter: Payer: Self-pay | Admitting: Emergency Medicine

## 2011-08-02 ENCOUNTER — Emergency Department
Admission: EM | Admit: 2011-08-02 | Disposition: A | Discharge status: Home / Self Care | Payer: Self-pay | Source: Ambulatory Visit | Attending: Emergency Medicine | Admitting: Emergency Medicine

## 2011-08-02 ENCOUNTER — Telehealth: Payer: Self-pay | Admitting: Pulmonology

## 2011-08-02 NOTE — ED Notes (Addendum)
Pt brought in by EMS because her metal trach tube fell out, no difficulty breathing or mentating and o2 sat is 97%.  Pt does not want it put back in and does not have any other complaints at this time.  Will continue to monitor and report any changes or concerns to the RN and provider.

## 2011-08-02 NOTE — ED Notes (Addendum)
Metal trach fell out. Denies SOB

## 2011-08-02 NOTE — ED Notes (Signed)
Pt patiently waiting to the the ENT attending, notified social work that we will need to arrange transportation for this pt when discharged

## 2011-08-02 NOTE — Telephone Encounter (Signed)
Chronic trach, followed by Dr. Salvadore Farber (ENT), seen once by Dr. Adela Ports Jan 2011.  Called pulm service reporting that her trach came out, neither she nor aide able to replace it.  She is phonating clearly on the phone, full sentences, without overt dyspnea.  Curbsided ENT attg (happened to be on the unit while I was on the phone) who said to have her come to the [facility] ER and his residents will place trach for her.  She does not have a spare trach at home; I told her to bring her home trach just in case.

## 2011-08-02 NOTE — Progress Notes (Signed)
Contacts: RN, [employer], Pt    Intervention:   SW was notified by RN that pt is ready to be discharged home. SW met with pt and confirmed that there is a ramp to enter her apartment and she has a wheelchair at her apartment.     Transportation:  [employer] scheduled to arrive approximately 5:30pm.   Medicaid     Plan:   Social Work available to assist as needed.    Kandra Nicolas, LMSW  [phone]

## 2011-08-02 NOTE — Discharge Instructions (Signed)
 - Follow up with Dr. Salvadore Farber within the next two weeks for tracheostomy evaluation.    Care of a Tracheostomy Tube      The most common reasons for having a tracheotomy are to get air into the lungs past a block of the upper airway or to give long term ventilator care. The surgeon makes an opening (stoma) in the front of the neck into the windpipe (trachea). Then a curved plastic tube (tracheostomy tube) is placed into the opening through which you breathe. The tube requires care and some changes in the way you do things. With this tube, air goes directly into your windpipe and to your lungs without being filtered, warmed or moistened which are actions that your nose and mouth do naturally.       SAFETY   Always have extra tracheostomy tubes on hand for an emergency (one the same size and one smaller).   Do not re-sterilize plastic tubes or attempt to clean them in boiling water. These are designed for one time,"Single Use Only."   Do not place the plastic tracheostomy tube where the temperature is over 118 F (48 C).   If you have a cuffed tracheostomy tube, do not over-inflate the cuff. This can injure your windpipe. It may also cause the cuff to extend past the end of the tube where it can restrict or block air flow.   Watch for these signs of infection. If present, notify your physician:  l Red, inflamed skin at stoma  l Foul-smelling mucus  l Bright red blood in mucus   If you cannot remove your tracheostomy tube, or if you cannot remove the inner cannula, do not force it. Call your physician.   Avoid dust and mold.   Avoid tobacco and other kinds of smoke.   Avoid fumes from cleaning solutions such as ammonia or bleach.   Be careful using any kind of spray product, such as hairspray, furniture polish, etc. You do not want to inhale the mist.   Bathing & Showering:    l When you shower, direct the shower spray at chest level and place a shower shield or protective covering over your  tracheostomy tube.   l Be careful to keep soap and water away from the tube and stoma when washing your face.   l Take care to cover your tracheostomy tube when you are shaving or using powders, hair spray, etc. around your face and neck.  l Keep clothing away from the tracheostomy tube except for a protective scarf. The main concern about clothing is that it does not block the tracheostomy tube. Avoid crew necks and turtlenecks in favor of V-necks and open collar shirts or blouses. Also, do not wear clothes that shed fibers or lint.  l If going outside in very cold air, wear an artificial nose or loosely cover the tracheostomy tube with a scarf, handkerchief or gauze. This helps to warm the air as you breathe, so that the cold air does not irritate your windpipe and lungs. It also helps to keep out dust or dirt on windy days.   Humidification:    l Use a humidifier at home to keep some moisture in the air and prevent drying out of your airway and lungs.  l Clean the humidifier regularly to prevent buildup of mold and mildew.   Illness:  l If illness occurs, you may need to suction more frequently.  l Drink more than the usual amount of fluids during  the days if you have symptoms of fever, vomiting or diarrhea.  l If you vomit, cover the tracheostomy tube with a towel to keep vomit out of your airway. If you think vomit may have entered the tracheostomy tube, suction immediately.  l Be sure all caregivers know CPR for tracheostomy patients.  l Post CPR instructions where they will be handy in an emergency.  l Post emergency numbers near the phone.   Watch for these signs of infection. If present, notify your caregiver:  l Red, inflamed skin at stoma  l Foul-smelling or yellow or green mucus  l Bright red blood in mucus  l Pain   If you use a ventilator  l Routinely check the ventilator safety and sound alarms to be sure they are working properly.  l Be sure the ventilator tubes are properly placed so that they  do not pull on the tracheostomy tube.  l Do not twist or pull on the tracheostomy connector any more than you must. This may cause discomfort or disconnect the ventilator tubes.  l Hold the tracheostomy tube in place when hooking up or disconnecting the ventilator or humidification tubing.  l When using a ventilator with a fenestrated tracheostomy tube (a tube with one or more side openings in the wall of the tube), always use a non-fenestrated inner cannula with the correct connector.     CARE OF THE AREA AROUND THE TUBE   Keep the area around the opening in the neck clean to help prevent infection.   Change ties daily or whenever they become wet or soiled as follows:  CHANGE TIES  l Wash hands. (If you have an Geophysicist/field seismologist, he/she must wash his/her hands too.)  l Leave old ties in place. Pull one end of the twill tie through either neck flange hole.  l Adjust the ends of the tie until one is 7-10 cm longer than the other.  l Bring both ends of the tie around your neck and insert the longer end of the tie through the other neck flange hole.  l Pull the tie snug. Place one finger between the tie and the neck and tie the two ends together using a square knot. Do not use a bow. Change the location of the knot to prevent skin irritation.  l Cut the ends of the ties leaving only 3-5 cm.  l Carefully cut and remove soiled ties. If you use a cuffed tube, protect the inflation line when cutting the ties.  CLEAN STOMA   Clean around the opening (stoma) at least twice a day as described below. If you smell an odor around the neck or opening, clean the area every 8 hours until the odor is gone.   l Wash hands. (If you have an assistant, he/she must wash his/her hands too.)  l Dip a cotton swab into hydrogen peroxide and water mixture mixed in equal parts.  l Roll the cotton swab between the tracheostomy tube and the skin around the opening. Clean from the stoma outward. This removes wet or dried mucus.  l Repeat these two  steps using a fresh cotton swab each time, until entire area around the opening is clean.  l Rinse the area using clean cotton swabs dipped in clean water only.  l Use a dry gauze pad to pat lightly around the opening.  l Replace the tracheostomy dressing, if required.   Watch for red, irritated areas. If excessive redness or pimples occur around the opening,  call your doctor, reduce humidity delivered around opening and use only sterile water for cleaning.     SUCTION THE TUBE  With a tracheostomy, mucus can collect in and around the tracheostomy tube. It must be removed so it does not dry and block the tube. How frequently you suction will be determined by need and the amount of your secretions.     EQUIPMENT NEEDED   Container (for catheter rinse water).   Cotton swabs.   Gloves (like your doctor uses).   Replacement tracheostomy tubes (one the same size and one smaller).  Sterile normal saline or sterile water (to soften mucus so that it may be suctioned).   Suction catheter.   Suction machine with connecting tubing.      CHOOSE A SUCTION CATHETER  Ask your doctor or nurse for the proper size catheter for tracheostomy.     PROCEDURE  1 .Before suctioning a fenestrated tube, make sure the non-fenestrated inner cannula is in place.   2 .Wash hands.  3 .Put on gloves.  4 .Attach suction catheter to suction machine.  5 .Rinse catheter by suctioning sterile water.  6 .Hyper oxygenate by taking 3-4 deep breaths.  7 .With your thumb off the thumb hole, gently insert the catheter into the tracheostomy tube until it reaches the end of the tube or until you cough.  8 .Cover the thumb hole on the catheter to suction.  9 .Slowly remove the catheter while rolling it between your thumb and forefinger. Also pulse the suctioning by covering and uncovering the thumb hole of the catheter. (Start to finish should take no longer than 10 seconds.)  1 0.If you need to suction again, rinse the catheter first and take another  3-4 deep breaths, then repeat the suctioning. (Allow enough time between each catheter insertion for normal breathing or for the ventilator to re-oxygenate your body.)  1 1.If the mucus that has been suctioned appear

## 2011-08-02 NOTE — Provider Consult (Addendum)
[facility] Consult     Consult Requested by: ED  Consult Question: replacement of dislodged trach    Chief Complaint: dislodged trach tube    History of Present Illness:  HPI Comments: [name] is a 25 yo female presenting with a dislodged tracheostomy tube.  She has a h/o PNA in December of 2010 that progressed to sepsis and ARDS requiring intubation, ICU admission, and eventual tracheostomy.  She has had a #6 metal tracheostomy tube since that time and gets routine trach changes at 3 month intervals.  Her tube is typically capped during the day.  She reports that she woke up and the tube had been dislodged.  Her nursing aide was unable to replace the tube, and they reported to the [facility] ED.  She has not had any respiratory distress during this course and is vocalizing without difficulty.  She denies noisy breathing or SOB.      Past Medical History   Diagnosis Date   . Pneumonia      Conversion Data - [name]   . Quadriplegia      s/p C6 fracture   . Decubitus Ulcer Of The Left Buttock 03/17/2010     Past Surgical History   Procedure Date   . Converted procedure      Open Treatment Of Single Cervical Vertebral Body Fracture Conversion Data    . Converted procedure      Tracheostomy Conversion Data    . Gastrostomy tube placement      Percutaneous Placement Of Gastrostomy Tube Conversion Data    . Converted procedure      Closed Treatment Of Orbital Fracture (Non-Blowout) Conversion Data    . Converted procedure      Closed Treatment Of Skull Fracture (Non-operative) Conversion Data    . Converted procedure      Radiologic Supervision: [facility] visitor In IVC Conversion Data      Family History   Problem Relation Age of Onset   . Conversion Other      [id number]^WJXBJY Syndrome^436^Active^   . Conversion Other      L4663738.7^Active^   . Conversion Other      [id number]^HYQMVHQI Mellitus^250.00^Active^   . Diabetes Mother    . High cholesterol Mother      History     Social History   . Marital  Status: Single     Spouse Name: N/A     Number of Children: N/A   . Years of Education: N/A     Social History Main Topics   . Smoking status: Former Smoker     Types: Cigarettes     Quit date: 03/01/2011   . Smokeless tobacco: Never Used   . Alcohol Use: Not on file   . Drug Use: Not on file   . Sexually Active: Not on file     Other Topics Concern   . Not on file     Social History Narrative   . No narrative on file       Allergies:   Allergies   Allergen Reactions   . Food      Pt denies food allergy   . Heparin      Created by Conversion - Thrombocytopenia;    . Nitrofurantoin      Created by Conversion - Vomiting;    . No Known Latex Allergy      Created by Conversion - 0;        Prior to Admission Medications:    (Not  in a hospital admission)    Active Hospital Medications:  No current facility-administered medications for this encounter.     Current Outpatient Prescriptions   Medication   . Skin Protectants, Misc. OINT   . oxybutynin (DITROPAN-XL) 10 MG 24 hr tablet   . Non-System Medication   . disposable gloves   . bisacodyl (DULCOLAX) 10 MG suppository   . guaiFENesin (MUCINEX) 600 MG 12 hr tablet   . docusate sodium (COLACE) 100 MG capsule       Review of Systems:  Review of Systems   Respiratory: Negative for hemoptysis, shortness of breath, wheezing and stridor.        Last Nursing documented pain:  0-10 Scale: 0 (08/02/11 1228)      Patient Vitals for the past 24 hrs:   BP Temp Pulse Resp   08/02/11 1228 167/80 mmHg 36.3 C (97.3 F) 82  18      O2 Device: None (Room air) (08/02/11 1256)  O2 Flow Rate: 97 L/min (08/02/11 1256)     Physical Examination:  Physical Exam   HENT:        NAD, awake, alert  Breathing unlabored and quiet  No stridor  Voice strong and clear  Neck with pinpoint stoma without evidence of air movement and hypertrophic scar around stoma  No crepitus         Lab Results: All labs in the last 24 hours No results found for this or any previous visit (from the past 24  hour(s)).    Radiology impressions (last 3 days):  No results found.      Currently Active/Followed Hospital Problems:  There are no hospital problems to display for this patient.      Assessment: 25 yo female with h/o respiratory distress p/w dislodged tracheostomy tube; no evidence of airway distress    Plan: An attempt was made to dilate the stoma with uterine sounds and insert a #4 Shiley trach tube, but pt could not tolerate sufficient dilation to place a tube.  At this point, it is unclear whether the pt is truly tracheostomy-dependent, and given her presentation, she is likely to do well without the tube in place.  We advised her to call/report to the ED immediately for signs or symptoms of dyspnea or stridor.  We recommended she call our office tomorrow [phone]) for an appointment sometime this week.    Author: Janell Quiet, MD  Note created: 08/02/2011  at: 5:09 PM    Attending Addendum:  Patient seen and examined.  History reviewed.  Agree with above note.  Cindy Ochoa is a 25 y.o. female s/p tracheostomy in 2011, trach became dislodged on the morning of admission.  By the time she presented to the ED and was seen by our team, there was significant closure of the stoma, along with a small hypertrophic scar or keloid at the site.  Uterine dilators were used to dilate the stoma to 53F, but the size 4 ETT still could not be placed.  Patient did not tolerate any further manipulation of the trach site.  She was breathing comfortably for a period of >8 hours since trach dislodgement.  Discussed options of further dilation in the ED, operative replacement of the trach, or leaving trach out for the time being and patient opted for the latter.  Plan in place for follow up in ENT clinic this week, or return to ED immediately if any resp distress.    Please call if any questions or  concerns.    Krystal Eaton, MD  Pediatric Otolaryngology  [phone]  Pager [phone]

## 2011-08-02 NOTE — ED Provider Notes (Addendum)
History     Chief Complaint   Patient presents with   . Tracheostomy Tube Change     HPI Comments: The patient is a 25 year old woman with history of C5 quadriplegia and tracheostomy who presents with difficulty replacing her tracheostomy Jackson 6 metal tube.  The aide took it out and was unable to replace it.  She called the pulmonary clinic hotline, which told her to come to the ED for ENT evaluation.      The history is provided by the patient. No language interpreter was used.       Past Medical History   Diagnosis Date   . Pneumonia      Conversion Data - [name]   . Quadriplegia      s/p C6 fracture   . Decubitus Ulcer Of The Left Buttock 03/17/2010       Past Surgical History   Procedure Date   . Converted procedure      Open Treatment Of Single Cervical Vertebral Body Fracture Conversion Data    . Converted procedure      Tracheostomy Conversion Data    . Gastrostomy tube placement      Percutaneous Placement Of Gastrostomy Tube Conversion Data    . Converted procedure      Closed Treatment Of Orbital Fracture (Non-Blowout) Conversion Data    . Converted procedure      Closed Treatment Of Skull Fracture (Non-operative) Conversion Data    . Converted procedure      Radiologic Supervision: [facility] visitor In IVC Conversion Data        Family History   Problem Relation Age of Onset   . Conversion Other      [id number]^JWJXBJ Syndrome^436^Active^   . Conversion Other      L4663738.7^Active^   . Conversion Other      [id number]^ZHYQMVHQ Mellitus^250.00^Active^   . Diabetes Mother    . High cholesterol Mother        Social History      reports that she quit smoking about 5 months ago. Her smoking use included Cigarettes. She has never used smokeless tobacco. Her alcohol, drug, and sexual activity histories not on file.    Living Situation     Questions Responses    Patient lives with Family    Homeless No    Caregiver for other family member     External Services     Employment     Domestic Violence Risk           Review of Systems   Review of Systems   Constitutional: Negative for fever, chills and unexpected weight change.   HENT: Negative for sore throat, facial swelling, neck pain and dental problem.    Eyes: Negative for pain.   Respiratory: Negative for apnea, cough, choking, chest tightness, shortness of breath, wheezing and stridor.    Cardiovascular: Negative for chest pain.   Gastrointestinal: Negative for blood in stool.   Genitourinary: Negative for dysuria and flank pain.   Musculoskeletal: Negative for gait problem.   Skin: Negative for color change.   Neurological: Negative for dizziness, seizures and speech difficulty.   Hematological: Negative for adenopathy.   Psychiatric/Behavioral: Negative for agitation.       Physical Exam   BP 167/80  Pulse 82  Temp 36.3 C (97.3 F)  Resp 18    Physical Exam   Nursing note and vitals reviewed.  Constitutional: She is oriented to person, place, and time. She appears well-developed and  well-nourished. She is active and cooperative.  Non-toxic appearance. She does not have a sickly appearance. She does not appear ill. No distress.   HENT:   Head: Normocephalic and atraumatic.   Right Ear: External ear normal.   Left Ear: External ear normal.   Eyes: Conjunctivae and EOM are normal. Right eye exhibits no discharge. Left eye exhibits no discharge. No scleral icterus.   Neck: Phonation normal. No tracheal tenderness present. No rigidity. Decreased range of motion present. No tracheal deviation, no edema and no erythema present.            Tracheostomy almost closed.     Cardiovascular: Normal rate, regular rhythm, S1 normal, S2 normal and normal heart sounds.  PMI is not displaced.  Exam reveals no gallop, no S3, no S4, no distant heart sounds and no friction rub.    No murmur heard.  Pulmonary/Chest: Effort normal and breath sounds normal. No stridor. No respiratory distress. She has no decreased breath sounds. She has no wheezes. She has  no rhonchi. She has no rales. She exhibits no tenderness.   Abdominal: Soft. Bowel sounds are normal. She exhibits no distension and no mass. There is no tenderness. There is no rebound and no guarding.   Neurological: She is alert and oriented to person, place, and time. She is not disoriented. A sensory deficit (cannot feel below chest) is present. No cranial nerve deficit.        Quadriplegia.  Has control of arms but hands contracted.     Skin: Skin is warm, dry and intact. No rash noted. She is not diaphoretic. No erythema. No pallor.   Psychiatric: She has a normal mood and affect. Her speech is normal and behavior is normal.       Medical Decision Making   MDM  Number of Diagnoses or Management Options  Diagnosis management comments: Patient seen by me today, 08/02/2011 at 1:44 PM    Assessment:  25 y.o., female with quadriplegia comes to the ED with almost closed tracheostomy.  Differential Diagnosis includes tracheostomy stenosis.  Plan: Consult ENT.         Verlin Grills, MD    Patient seen by me today, 08/02/2011 at 1:44pm    History:   I reviewed this patient, reviewed the resident note and agree  Exam:   I examined this patient, reviewed the resident note and agree    Decision Making:   I discussed with the documented resident decision making  and agree    Patient whose trach tube fell out last night, now with stenosed stoma, ent resident at bedside evaluating patient will await ent attending.        Author Verlin Grills, MD      Verlin Grills, MD  08/02/11 [phone]

## 2011-08-02 NOTE — ED Notes (Signed)
[facility]aged hospital stores to get a jackson 6 metal trach.

## 2011-08-03 ENCOUNTER — Telehealth: Payer: Self-pay | Admitting: Physical Medicine and Rehabilitation

## 2011-08-03 ENCOUNTER — Telehealth: Payer: Self-pay | Admitting: Otolaryngology

## 2011-08-03 ENCOUNTER — Encounter: Payer: Self-pay | Admitting: Physical Medicine and Rehabilitation

## 2011-08-03 ENCOUNTER — Other Ambulatory Visit: Payer: Self-pay | Admitting: Physical Medicine and Rehabilitation

## 2011-08-03 DIAGNOSIS — N390 Urinary tract infection, site not specified: Secondary | ICD-10-CM

## 2011-08-03 MED ORDER — SULFAMETHOXAZOLE-TRIMETHOPRIM 800-160 MG PO TABS *I*
1.0000 | ORAL_TABLET | Freq: Two times a day (BID) | ORAL | Status: DC
Start: 2011-08-03 — End: 2011-12-08

## 2011-08-03 MED ORDER — OXYBUTYNIN CHLORIDE 5 MG PO TABS *I*
5.0000 mg | ORAL_TABLET | Freq: Three times a day (TID) | ORAL | Status: DC
Start: 2011-08-03 — End: 2011-12-16

## 2011-08-03 NOTE — Telephone Encounter (Signed)
Urine culture from August 3 positive for E. coli and gram-negative bacilli.  Prescription called to pharmacy for Septra for 7 days.  Message left on telephone for [place].

## 2011-08-03 NOTE — Progress Notes (Signed)
Notified that Oxybutynin LA not covered.   Have ordered Oxybutynin 5 mg TID. Rx sent

## 2011-08-03 NOTE — Telephone Encounter (Signed)
Pt went to ED yesterday because her trach fell out. ED told her to call office and setup appt with Dr. Hyacinth Meeker. Pt has Medicaid. Please call [phone]

## 2011-08-03 NOTE — Telephone Encounter (Signed)
Please book appointment with Cindy Ochoa when Dr Hyacinth Meeker is here, we have her replacement trach in the clinic

## 2011-08-04 ENCOUNTER — Encounter: Payer: Self-pay | Admitting: Internal Medicine

## 2011-08-04 LAB — AEROBIC CULTURE

## 2011-08-05 ENCOUNTER — Ambulatory Visit: Payer: Self-pay | Admitting: Internal Medicine

## 2011-08-05 ENCOUNTER — Encounter: Payer: Self-pay | Admitting: Sleep Medicine

## 2011-08-07 ENCOUNTER — Ambulatory Visit: Payer: Self-pay | Admitting: Otolaryngology

## 2011-08-07 DIAGNOSIS — J9690 Respiratory failure, unspecified, unspecified whether with hypoxia or hypercapnia: Secondary | ICD-10-CM

## 2011-08-07 DIAGNOSIS — J988 Other specified respiratory disorders: Secondary | ICD-10-CM
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
## 2011-08-10 ENCOUNTER — Telehealth: Payer: Self-pay | Admitting: Internal Medicine

## 2011-08-10 MED ORDER — INCONTINENCE SUPPLY DISPOSABLE MISC *A*
Status: DC
Start: 2011-08-10 — End: 2012-05-13

## 2011-08-10 MED ORDER — INCONTINENCE SUPPLY DISPOSABLE MISC *A*
Status: DC
Start: 2011-08-10 — End: 2011-08-10

## 2011-08-10 NOTE — Telephone Encounter (Signed)
Rx signed.

## 2011-08-12 ENCOUNTER — Telehealth: Payer: Self-pay | Admitting: Internal Medicine

## 2011-08-12 NOTE — Telephone Encounter (Signed)
[name] the pt's mother called to get a Rx for a add on foley kit.  Please send this to [facility].

## 2011-08-13 ENCOUNTER — Ambulatory Visit: Payer: Self-pay | Admitting: Internal Medicine

## 2011-08-13 ENCOUNTER — Encounter: Payer: Self-pay | Admitting: Internal Medicine

## 2011-08-13 DIAGNOSIS — G8254 Quadriplegia, C5-C7 incomplete: Secondary | ICD-10-CM

## 2011-08-13 DIAGNOSIS — R252 Cramp and spasm: Secondary | ICD-10-CM

## 2011-08-13 DIAGNOSIS — F3289 Other specified depressive episodes: Secondary | ICD-10-CM

## 2011-08-13 DIAGNOSIS — N319 Neuromuscular dysfunction of bladder, unspecified: Secondary | ICD-10-CM

## 2011-08-13 MED ORDER — SERTRALINE HCL 50 MG PO TABS *I*
50.0000 mg | ORAL_TABLET | Freq: Every day | ORAL | Status: DC
Start: 2011-08-13 — End: 2012-08-15

## 2011-08-13 MED ORDER — SKIN PROTECTANTS, MISC. EX OINT
1.0000 | TOPICAL_OINTMENT | Freq: Two times a day (BID) | CUTANEOUS | Status: DC
Start: 2011-08-13 — End: 2017-11-05

## 2011-08-13 MED ORDER — HYDROXYZINE HCL 25 MG PO TABS *I*
25.0000 mg | ORAL_TABLET | Freq: Three times a day (TID) | ORAL | Status: DC | PRN
Start: 2011-08-13 — End: 2012-03-18

## 2011-08-13 MED ORDER — BACLOFEN 10 MG PO TABS *I*
ORAL_TABLET | ORAL | Status: DC
Start: 2011-08-13 — End: 2012-03-18
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
## 2011-08-13 NOTE — Telephone Encounter (Signed)
Talked to Juanell Fairly who was actually looking for the ointment protectant [name] had prescribed earlier today  Per [place]/and [facility] does not cover the protectant ointment.  [facility] does have OTC Calmoseptine which is what [facility] had [facility] for $5.95.  Mom understood this would have to come out of pocket.

## 2011-08-13 NOTE — Patient Instructions (Addendum)
Slow increase baclofen to 2 tablets 3 times daily    Start zoloft 50 mg    Hydroxyzine 25 mg once daily for anxiety     Call urology

## 2011-08-14 ENCOUNTER — Telehealth: Payer: Self-pay | Admitting: Internal Medicine

## 2011-08-14 NOTE — Telephone Encounter (Signed)
This was sent to me via staff message.  This is a Retail [employer] patient.  Please address.

## 2011-08-14 NOTE — Telephone Encounter (Signed)
Cindy Ochoa/Cindy Ochoa  is calling for Cindy Ochoa stated the script given yesterday for the skin protector was denied per the insurance company and Cindy Ochoa needs this asap.  Asking if something can be sent to the pharmacy.

## 2011-08-14 NOTE — Telephone Encounter (Signed)
Message copied by Malon Kindle on Fri Aug 14, 2011  9:13 AM  ------       Message from: Karen Kitchens       Created: Thu Aug 13, 2011 11:43 AM         Please call  [facility] to see what add-on cath kits. This patient uses so that I can write the rx one time.  How many can I give her and how many refills are allowed

## 2011-08-14 NOTE — Telephone Encounter (Signed)
[name] calling again.  States that we have some of the skin protectors and wants to know if we can give her some if she comes to our office today.  Please call her asap to let he know if we can do this for her.  The insurance will not give them any.

## 2011-08-14 NOTE — Telephone Encounter (Signed)
Talked to [facility] - we don't have any skin protectant on AC5 .  She [facility].  She will call there.  Per pharmacy they have it but with her Medicaid it is not covered and she will have to pay out of pocket.

## 2011-08-25 ENCOUNTER — Telehealth: Payer: Self-pay

## 2011-08-25 NOTE — Telephone Encounter (Signed)
Pt.'s mother, [name], called to schedule an appt because the pt has been experiencing bloating, vaginal discharge with odor, and has not had a period since February. The pt is will need a hoyer lift. Please call [name] at [phone]

## 2011-08-27 NOTE — Telephone Encounter (Signed)
LMOVM for mother to call office

## 2011-08-28 NOTE — Telephone Encounter (Signed)
Appt made

## 2011-09-01 ENCOUNTER — Encounter: Payer: Self-pay | Admitting: Obstetrics and Gynecology

## 2011-09-01 ENCOUNTER — Ambulatory Visit: Payer: Self-pay | Admitting: Obstetrics and Gynecology

## 2011-09-01 VITALS — BP 119/68 | Ht 64.0 in | Wt >= 6400 oz

## 2011-09-01 DIAGNOSIS — Z113 Encounter for screening for infections with a predominantly sexual mode of transmission: Secondary | ICD-10-CM

## 2011-09-01 DIAGNOSIS — Z3009 Encounter for other general counseling and advice on contraception: Secondary | ICD-10-CM

## 2011-09-01 DIAGNOSIS — B9689 Other specified bacterial agents as the cause of diseases classified elsewhere: Secondary | ICD-10-CM

## 2011-09-01 DIAGNOSIS — N912 Amenorrhea, unspecified: Secondary | ICD-10-CM

## 2011-09-01 DIAGNOSIS — N898 Other specified noninflammatory disorders of vagina: Secondary | ICD-10-CM

## 2011-09-01 DIAGNOSIS — A599 Trichomoniasis, unspecified: Secondary | ICD-10-CM

## 2011-09-01 DIAGNOSIS — N760 Acute vaginitis: Secondary | ICD-10-CM

## 2011-09-01 LAB — PROLACTIN: Prolactin: 13.1 ng/mL

## 2011-09-01 LAB — PREGNANCY TEST, SERUM: Preg,Serum: NEGATIVE

## 2011-09-01 LAB — TSH: TSH: 0.67 u[IU]/mL (ref 0.27–4.20)

## 2011-09-01 MED ORDER — METRONIDAZOLE 500 MG PO TABS *I*
500.0000 mg | ORAL_TABLET | Freq: Two times a day (BID) | ORAL | Status: AC
Start: 2011-09-01 — End: 2011-09-08

## 2011-09-01 MED ORDER — ULIPRISTAL ACETATE 30 MG PO TABS *I*
30.0000 mg | ORAL_TABLET | Freq: Once | ORAL | Status: AC
Start: 2011-09-01 — End: 2011-09-01

## 2011-09-01 NOTE — Progress Notes (Signed)
Brandyn Steinert is a 25 y.o. female here for vaginal discharge. Patient is a quadrapelgic. Her mother present to visit with patient and is her primary care taker    HPI: Noticing a vaginal odor and increase in discharge for the past few weeks. No pain or fever. Has a sex partner and does not use condoms 100% of the time.   Has not had a menstrual cycle since march. Has menstrual s/s. Menses were regular prior to March.  Patient denies pregnancy s/s. She would not mind being pregnant.     Gynecologic History:  No LMP recorded. Patient is not currently having periods (Reason: Other).   Safe Sex:  No  Contraception: none  Pap 2010: negative    OB History     Grav Para Term Preterm Abortions TAB SAB Ect Mult Living    1         1          Patient Active Problem List   Diagnoses Code   . 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 Chronic Pain 338.29   . Quadriparesis At C6 344.04   . Constipation, chronic 564.00   . Depression 311   . Neurogenic Bladder 596.54   . Hypotension 458.9   . Urinary Tract Infection 599.0   . Pneumonia, organism unspecified 486       Allergies   Allergen Reactions   . Food      Pt denies food allergy   . Heparin      Thrombocytopenia;    . Nitrofurantoin      Vomiting   . No Known Latex Allergy        Current Outpatient Prescriptions   Medication   . Skin Protectants, Misc. OINT   . baclofen (LIORESAL) 10 MG tablet   . sertraline (ZOLOFT) 50 MG tablet   . hydrOXYzine HCl (ATARAX) 25 MG tablet   . incontinence supply disposable   . oxybutynin (DITROPAN) 5 MG tablet   . Non-System Medication   . disposable gloves   . bisacodyl (DULCOLAX) 10 MG suppository   . guaiFENesin (MUCINEX) 600 MG 12 hr tablet   . docusate sodium (COLACE) 100 MG capsule   . metroNIDAZOLE (FLAGYL) 500 MG tablet   . Ulipristal Acetate (ELLA) 30 MG TABS   . sulfamethoxazole-trimethoprim (BACTRIM DS,SEPTRA DS) 800-160 MG per tablet       ROS:  Per HPI  CONSTITUTIONAL: Appetite good, no fevers, night sweats or weight loss  GI:  No nausea/vomiting, abdominal pain, or change in bowel habits  GU: No dysuria, urgency or incontinence  PSYCH: No depression or anxiety    Objective:  BP 119/68  Ht 5\' 4"  (1.626 m)  Wt 437 lb 1.6 oz (198.267 kg)  BMI 75.03 kg/m2     GENERAL: 25 y.o. year y/o female in NAD.  PELVIC: Normal external female genitalia, labia majora, minora, clitoris. BUS WNL.   Vagina: pink tissue without lesions. Thin white -yellow discharge noted  Normal appearing cervix. No cervical motion tenderness   Uterus is anteverted, mobile, and non-tender.   Ovaries are WNL.  Adnexa negative for masses.  MENTAL STATUS: Alert, normal MS. Answers all questions appropriately.  Wet prep: + trich, + clue and whiff      Assessment:  Trichomoniasis  Bacterial vaginosis  Contraceptive education  Amenorrhea    Plan:  Rx for flagyl 500 mg 1 p.o. Bid x 7, no ETHO. For tich and BV treatment.   STD screening reviewed and  safer sex practices. Encouraged partner notification and tx for trich. HIV screening accepted, syphilis, Hep B&C. Condoms encouraged and samples supplied. Use condoms. No unprotected sex until both are treated and have waited for 1 week. GC and chlamydia sent.  Contraceptive options reviewed briefly and Rx for EC Samson Frederic) and condom use encouraged.  Amenorrhea:Serum pregnancy test requested and if negative would offer provera challenge. TSH and prolactin sent  RTC for test results and f/u.

## 2011-09-01 NOTE — Patient Instructions (Signed)
What is bacterial vaginosis?  Bacterial Vaginosis (BV) is the name of a condition in women where the normal balance of bacteria in the vagina is disrupted and replaced by an overgrowth of certain bacteria. It is sometimes accompanied by discharge, odor, pain, itching, or burning.    How common is bacterial vaginosis?  Bacterial Vaginosis (BV) is the most common vaginal infection in women of childbearing age. In the [place], BV is common in pregnant women.    How do people get bacterial vaginosis?  The cause of BV is not fully understood. BV is associated with an imbalance in the bacteria that are normally found in a woman’s vagina. The vagina normally contains mostly “good” bacteria, and fewer “harmful” bacteria. BV develops when there is an increase in harmful bacteria.   Not much is known about how women get BV. There are many unanswered questions about the role that harmful bacteria play in causing BV. Any woman can get BV. However, some activities or behaviors can upset the normal balance of bacteria in the vagina and put women at increased risk including:   • Having a new sex partner or multiple sex partners   • Douching  It is not clear what role sexual activity plays in the development of BV. Women do not get BV from toilet seats, bedding, swimming pools, or from touching objects around them. Women who have never had sexual intercourse may also be affected.    What are the signs and symptoms of bacterial vaginosis?  Women with BV may have an abnormal vaginal discharge with an unpleasant odor. Some women report a strong fish-like odor, especially after intercourse. Discharge, if present, is usually white or gray; it can be thin. Women with BV may also have burning during urination or itching around the outside of the vagina, or both. However, most women with BV report no signs or symptoms at all.    What are the complications of bacterial vaginosis?   In most cases, BV causes no complications. But  there are some serious risks from BV including:   • Having BV can increase a woman’s susceptibility to HIV infection if she is exposed to the HIV virus.  • Having BV increases the chances that an HIV-infected woman can pass HIV to her sex partner.  • Having BV has been associated with an increase in the development of an infection following surgical procedures such as a hysterectomy or an abortion.  • Having BV while pregnant may put a woman at increased risk for some complications of pregnancy, such as a preterm delivery.  • BV can increase a woman’s susceptibility to other STDs, such as herpes simplex virus (HSV), chlamydia and gonorrhea.    How does bacterial vaginosis affect a pregnant woman and her baby?   Pregnant women with BV more often have babies who are born premature or with low birth weight (low birth weight is less than 5.5 pounds).  The bacteria that cause BV can sometimes infect the uterus (womb) and fallopian tubes (tubes that carry eggs from the ovaries to the uterus). This type of infection is called pelvic inflammatory disease (PID). PID can cause infertility or damage the fallopian tubes enough to increase the future risk of ectopic pregnancy and infertility. Ectopic pregnancy is a life-threatening condition in which a fertilized egg grows outside the uterus, usually in a fallopian tube which can rupture.     How is bacterial vaginosis diagnosed?   A health care provider must examine the   vagina for signs of BV and perform laboratory tests on a sample of vaginal fluid to look for bacteria associated with BV.    What is the treatment for bacterial vaginosis?  Although BV will sometimes clear up without treatment, all women with symptoms of BV should be treated to avoid complications. Female partners generally do not need to be treated. However, BV may spread between female sex partners.  Treatment is especially important for pregnant women. All pregnant women who have ever had a premature delivery  or low birth weight baby should be considered for a BV examination, regardless of symptoms, and should be treated if they have BV. All pregnant women who have symptoms of BV should be checked and treated.  Some physicians recommend that all women undergoing a hysterectomy or abortion be treated for BV prior to the procedure, regardless of symptoms, to reduce their risk of developing an infection.  BV is treatable with antibiotics prescribed by a health care provider. Two different antibiotics are recommended as treatment for BV: metronidazole or clindamycin. Either can be used with non-pregnant or pregnant women, but the recommended dosages differ. Women with BV who are HIV-positive should receive the same treatment as those who are HIV-negative.  BV can recur after treatment.    How can bacterial vaginosis be prevented?   BV is not completely understood by scientists, and the best ways to prevent it are unknown. However, it is known that BV is associated with having a new sex partner or having multiple sex partners.  The following basic prevention steps can help reduce the risk of upsetting the natural balance of bacteria in the vagina and developing BV:  • Be abstinent.  • Limit the number of sex partners.  • Do not douche.  • Use all of the medicine prescribed for treatment of BV, even if the signs and symptoms go away.    FOR MORE INFORMATION:   Division of STD Prevention (DSTDP) Centers for Disease Control and Prevention  www.cdc.gov/std     CDC-INFO Contact Center 1-800-CDC-INFO (1-800-232-4636)   email: cdcinfo@cdc.gov     Website: www.cdc.gov     Reviewed 09/2010  Trichomoniasis    What is trichomoniasis?  Trichomoniasis is a common sexually transmitted disease (STD) that affects both women and men, although symptoms are more common in women.    How common is trichomoniasis?  Trichomoniasis is the most common curable STD in young, sexually active women. An estimated 7.4 million new cases occur each year in  women and men.    How do people get trichomoniasis?  Trichomoniasis is caused by the single-celled protozoan parasite, Trichomonas vaginalis. The vagina is the most common site of infection in women, and the urethra (urine canal) is the most common site of infection in men.  The parasite is sexually transmitted through penis-to-vagina intercourse or vulva-to-vulva (the genital area outside the vagina) contact with an infected partner. Women can acquire the disease from infected men or women, but men usually contract it only from infected women.     What are the signs and symptoms?  Most men with trichomoniasis do not have signs or symptoms; however, some men may temporarily have an irritation inside the penis, mild discharge, or slight burning after urination or ejaculation.  Some women have signs or symptoms of infection which include a frothy, yellow-green vaginal discharge with a strong odor. The infection also may cause discomfort during intercourse and urination, as well as irritation and itching of the female genital area.   In rare cases, lower abdominal pain can occur. Symptoms usually appear in women within 5 to 28 days of exposure.    What are the complications of trichomoniasis?  The genital inflammation caused by trichomoniasis can increase a woman’s susceptibility to HIV infection if she is exposed to the virus. Having trichomoniasis may increase the chance that an HIV-infected woman passes HIV to her sex partner(s).    How does trichomoniasis affect a pregnant woman and her baby?   Pregnant women with trichomoniasis may have babies who are born early or with low birth weight (low birth weight is less than 5.5 pounds).     How is trichomoniasis diagnosed?   For both men and women, a health care provider must perform a physical examination and laboratory test to diagnose trichomoniasis. The parasite is harder to detect in men than in women. In women, a pelvic examination can reveal small red ulcerations  (sores) on the vaginal wall or cervix.     What is the treatment for trichomoniasis?  Trichomoniasis can usually be cured with prescription drugs, either metronidazole or tinidazole, given by mouth in a single dose. The symptoms of trichomoniasis in infected men may disappear within a few weeks without treatment. However, an infected man, even a man who has never had symptoms or whose symptoms have stopped, can continue to infect or re-infect a female partner until he has been treated. Therefore, both partners should be treated at the same time to eliminate the parasite. Persons being treated for trichomoniasis should avoid sex until they and their sex partners complete treatment and have no symptoms. Metronidazole can be used by pregnant women. Having trichomoniasis once does not protect a person from getting it again. Following successful treatment, people can still be susceptible to re-infection.    How can trichomoniasis be prevented?  The surest way to avoid transmission of sexually transmitted diseases is to abstain from sexual contact, or to be in a long-term mutually monogamous relationship with a partner who has been tested and is known to be uninfected.  Latex female condoms, when used consistently and correctly, can reduce the risk of transmission of trichomoniasis.  Any genital symptom such as discharge or burning during urination or an unusual sore or rash should be a signal to stop having sex and to consult a health care provider immediately. A person diagnosed with trichomoniasis (or any other STD) should receive treatment and should notify all recent sex partners so that they can see a health care provider and be treated. This reduces the risk that the sex partners will develop complications from trichomoniasis and reduces the risk that the person with trichomoniasis will become re-infected. Sex should be stopped until the person with trichomoniasis and all of his or her recent partners complete  treatment for trichomoniasis and have no symptoms.    Centers for Disease Control and Prevention  http://www.cdc.gov/std/  CDC-INFO Contact Center  1-800-CDC-INFO (1-800-232-4636)  Email: cdcinfo@cdc.gov    Reviewed 09/2010

## 2011-09-02 ENCOUNTER — Telehealth: Payer: Self-pay | Admitting: Internal Medicine

## 2011-09-02 ENCOUNTER — Encounter: Payer: Self-pay | Admitting: Sleep Medicine

## 2011-09-02 LAB — SYPHILIS SCREEN
Syphilis Screen: NEGATIVE
Syphilis Status: NONREACTIVE

## 2011-09-02 LAB — N. GONORRHOEAE DNA AMPLIFICATION: N. gonorrhoeae DNA Amplification: 0

## 2011-09-02 LAB — CHLAMYDIA PLASMID DNA AMPLIFICATION: Chlamydia Plasmid DNA Amplification: 0

## 2011-09-02 LAB — HIV-1 AND 2 AB: HIV 1&2 Ab screen: NEGATIVE

## 2011-09-02 LAB — HEPATITIS C ANTIBODY: Hep C Ab: NEGATIVE

## 2011-09-02 LAB — HEPATITIS B SURFACE ANTIGEN: HBV S Ag: NEGATIVE

## 2011-09-02 NOTE — Telephone Encounter (Signed)
Carline wondering if providers notes from visit on 6/4 with denise can be faxed. [phone] ATT: Carolin Havland

## 2011-09-02 NOTE — Telephone Encounter (Signed)
Records faxed (medical release in chart).

## 2011-09-03 ENCOUNTER — Telehealth: Payer: Self-pay | Admitting: Obstetrics and Gynecology

## 2011-09-03 ENCOUNTER — Telehealth: Payer: Self-pay

## 2011-09-03 DIAGNOSIS — N912 Amenorrhea, unspecified: Secondary | ICD-10-CM

## 2011-09-03 MED ORDER — MEDROXYPROGESTERONE ACETATE 10 MG PO TABS *I*
10.0000 mg | ORAL_TABLET | Freq: Every day | ORAL | Status: AC
Start: 2011-09-03 — End: 2011-09-13

## 2011-09-03 NOTE — Telephone Encounter (Signed)
pt calling for lab results for blood work on 09/01/11. Please call pt at [phone]

## 2011-09-03 NOTE — Telephone Encounter (Signed)
Called patient and left message for her to call the office. Amenorrhea with negative serum pregnancy test. Normal TSH and prolactin. Plan to proceed with provera challenge as discussed in the office. 10 mg p.o. Daily x 10 days. Patient will need to monitor for bleeding 10 days or so post last dose of medication.  She should call if she is soaking 1 pad an hour.  Patient has f/u appointment for results and plan.  Rx for provera sent to pharmacy of file.

## 2011-09-03 NOTE — Telephone Encounter (Signed)
Patient made aware of all results from 09/01/11 except HIV per protocol, patient verbalized understanding.

## 2011-09-04 NOTE — Telephone Encounter (Signed)
VMLOT.

## 2011-09-04 NOTE — Telephone Encounter (Signed)
Pt informed of Cindy Ochoa"s plan. She states a good understanding.

## 2011-09-09 ENCOUNTER — Telehealth: Payer: Self-pay

## 2011-09-09 NOTE — Telephone Encounter (Signed)
Pt on day 7 of provera and no menses yet, she wants to know when she might expect period and will she need to take meds again? Please advise.

## 2011-09-09 NOTE — Telephone Encounter (Signed)
Reviewed info with pt per  G. Fluellen, pt understands and agrees to plan.

## 2011-09-09 NOTE — Telephone Encounter (Signed)
May expect bleeding 10 days after the last pill.  If she does not get a menstrual cycle she will be seen for f/u as planned and any additional recommendations will be made at this time.

## 2011-09-09 NOTE — Telephone Encounter (Signed)
Patient is calling with questions about meds to bring cycle on.Pills are almost gone and cycle has not started yet,please call at [phone]

## 2011-09-22 ENCOUNTER — Ambulatory Visit: Payer: Self-pay | Admitting: Physical Medicine and Rehabilitation

## 2011-09-23 ENCOUNTER — Encounter: Payer: Self-pay | Admitting: Obstetrics and Gynecology

## 2011-09-23 ENCOUNTER — Ambulatory Visit: Payer: Self-pay | Admitting: Obstetrics and Gynecology

## 2011-09-23 DIAGNOSIS — N912 Amenorrhea, unspecified: Secondary | ICD-10-CM

## 2011-09-23 DIAGNOSIS — Z3009 Encounter for other general counseling and advice on contraception: Secondary | ICD-10-CM

## 2011-09-23 DIAGNOSIS — N390 Urinary tract infection, site not specified: Secondary | ICD-10-CM

## 2011-09-23 DIAGNOSIS — Z113 Encounter for screening for infections with a predominantly sexual mode of transmission: Secondary | ICD-10-CM

## 2011-09-23 DIAGNOSIS — R319 Hematuria, unspecified: Secondary | ICD-10-CM

## 2011-09-23 LAB — POCT URINALYSIS DIPSTICK
Bilirubin,Ur: NEGATIVE
Glucose,UA POCT: NORMAL
Ketones,UA POCT: NEGATIVE
Leuk Esterase,UA POCT: 2 — AB
Lot #: 21155402
Nitrite,UA POCT: POSITIVE — AB
PH,UA POCT: 8 (ref 5–8)
Specific gravity,UA POCT: 1.01 (ref 1.002–1.03)
Urobilinogen,UA: NORMAL

## 2011-09-23 LAB — POCT URINE PREGNANCY: Lot #: 209471

## 2011-09-23 MED ORDER — IBUPROFEN 800 MG PO TABS *I*
800.0000 mg | ORAL_TABLET | Freq: Three times a day (TID) | ORAL | Status: DC | PRN
Start: 2011-09-23 — End: 2012-02-18

## 2011-09-23 MED ORDER — ULIPRISTAL ACETATE 30 MG PO TABS *I*
30.0000 mg | ORAL_TABLET | Freq: Once | ORAL | Status: AC
Start: 2011-09-23 — End: 2011-09-23

## 2011-09-23 MED ORDER — SULFAMETHOXAZOLE-TRIMETHOPRIM 800-160 MG PO TABS *I*
1.0000 | ORAL_TABLET | Freq: Two times a day (BID) | ORAL | Status: DC
Start: 2011-09-23 — End: 2011-12-08

## 2011-09-23 MED ORDER — NORETHINDRONE  0.35 MG PO TABS *I*
1.0000 | ORAL_TABLET | Freq: Every day | ORAL | Status: DC
Start: 2011-09-23 — End: 2012-03-18

## 2011-09-23 NOTE — Patient Instructions (Addendum)
IMPLANON SINGLE-ROD CONTRACEPTIVE IMPLANT     What is the single-rod contraceptive implant?    The single-rod contraceptive implant is a type of birth control.  The implant is a small tube made of plastic about the size of a matchstick.  It contains a hormone called etonogestrel (et-oh-no-JES-trel) that prevents users form getting pregnant.  In the [place], the single-rod implant often goes by it’s brand name, Implanon.    How safe and effective is the single-rod implant?  The World Health Organization has said that implants are one of the safest and most effective forms of birth control available.  Even though the single-rod implant is new to the [place], more than 60 million women in the worked have used similar implants.  One of the main reasons the single-rod implant works so well is because users don’t have to remember to do something about their birth control every day or every time they have sex.    How does the single-rod implant work?  After the implant is placed in your upper arm, it will slowly release the hormone into your body.  This hormone does several things to keep users from getting pregnant:  • It stops eggs from leaving the ovaries.  • It makes the mucus around the cervix thick.  This keeps sperm from getting to the egg.  • It makes the lining of the uterus thin. This keeps an egg from attaching to the uterus.     What are the benefits of using the single-rod implant?  • You do not have to think about your birth control every day or every time you have sex.  • Once it is inserted, it works for 3 years.  • If you want to stop using it, it can be removed at any time.  • You can use it if you are breast feeding.  • It does not contain estrogen, a hormone that some women can’t use in birth control.    What are the downsides of using the single-rod implant?  Like all medicines, the implant can have some side effects.  For example, women who use the single-rod implant may have:  • Irregular  bleeding  • Heavy and/or longer periods  • Periods that are lighter and occur less often  • No periods at all  These changes do not mean that something is wrong.  While using the implant, if your period stops, it does not necessarily mean that you are pregnant.  Rarely, other side effects may occur.  Talk about these with your health care provider so you know what to expect and what to do if they occur.  The single-rod implant does not protect you from HIV or other sexually transmitted infections (STIs).  Use a condom to protect yourself from STIs.      Who should NOT use the single-rod implant?  Only trained health care providers (including doctors, nurses, and nurse midwives) can insert or remove the single-rod implant.  If you health care provider isn’t trained to insert an implant, ask for a referral to someone who is trained.    How is the single-rod implant inserted?  The implant is inserted underneath the skin of your arm.  It takes less than 1 minute to insert the implant.  Your health care provider will numb your skin, and then use a thin applicator to insert the implant under your skin.  The small puncture where it is inserted is covered with a bandage and   will heal in a few days.  After the implant is in place, you will be able to feel it but not see it.  Make sure you can feel the implant in your arm before you leave your health care provider’s office.    Can I get the single-rod implant at any time?  The timing for putting in the implant is very important.  Tell your health care provider when you had your last period.    Before the implant is inserted, you may have to take a pregnancy test.   After it’s inserted, you should use a backup method of birth control for 7 days.  This will prevent you from getting pregnant while your body adjusts to the implant.     What if I want to stop using the single-rod implant?  You can use the implant for 3 years before having it removed.  But, if you want to become  pregnant or switch to another type of birth control, you can do so at any time.  You can make an appointment with a health care provider to have it removed.  Removal takes about 3 minutes.   Once the implant is taken out, you should be able to get pregnant right away.  If you don’t want to become pregnant after the implant is removed, you will have to start using another type of birth control for 7 days before it is removed.     Can I keep using the single-rod implant after 3 years?  The single-rod implant must be removed after 3 years because the hormone supplying will run out.  If you want to keep using this method, you can have another implant inserted when the old implant is removed.     Can I use the single-rod implant when I am breast feeding?  Yes, you can use the implant while breast-feeding if 4 weeks have passed since you had your baby.  A small amount of the hormone gets into your breast milk, but it will not harm your baby.    Are there any risks from the procedure I need to know about?  You may feel pain, soreness or have swelling or bruising where the implant was placed.  You also may have a small scar at the insertion site.   Although it is rare, infection is possible.    Very rarely, there are difficulties with the implant. It can be inserted the wrong way or can break, which makes it hard to remove.  The implant may move under skin, which also makes it difficult to remove.  You may be asked to have an x-ray to see the implant.         Reviewed 09/2010
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
## 2011-09-24 ENCOUNTER — Other Ambulatory Visit: Payer: Self-pay | Admitting: Internal Medicine

## 2011-09-24 LAB — AEROBIC CULTURE: Aerobic Culture: 0

## 2011-09-24 MED ORDER — NON-SYSTEM MEDICATION *A*
Status: DC
Start: 2011-09-24 — End: 2011-09-28

## 2011-09-24 NOTE — Telephone Encounter (Signed)
Patient requesting refill of foley catheters.

## 2011-09-28 ENCOUNTER — Telehealth: Payer: Self-pay | Admitting: Internal Medicine

## 2011-09-28 MED ORDER — NON-SYSTEM MEDICATION *A*
Status: DC
Start: 2011-09-28 — End: 2011-09-28

## 2011-09-28 MED ORDER — NON-SYSTEM MEDICATION *A*
Status: DC
Start: 2011-09-28 — End: 2011-10-22

## 2011-09-28 NOTE — Telephone Encounter (Signed)
Dawn from [facility] requesting call back in regards to patient's catheter order.

## 2011-09-28 NOTE — Telephone Encounter (Signed)
The first time I signed it I think it went to [facility].  I printed it and it is in your folder

## 2011-10-07 ENCOUNTER — Ambulatory Visit: Payer: Self-pay | Admitting: Physical Medicine and Rehabilitation

## 2011-10-21 ENCOUNTER — Ambulatory Visit: Payer: Self-pay | Admitting: Physical Medicine and Rehabilitation

## 2011-10-21 ENCOUNTER — Encounter: Payer: Self-pay | Admitting: Physical Medicine and Rehabilitation

## 2011-10-21 VITALS — BP 93/62 | HR 67

## 2011-10-21 DIAGNOSIS — G8254 Quadriplegia, C5-C7 incomplete: Secondary | ICD-10-CM

## 2011-10-21 DIAGNOSIS — G8253 Quadriplegia, C5-C7 complete: Secondary | ICD-10-CM

## 2011-10-21 DIAGNOSIS — R252 Cramp and spasm: Secondary | ICD-10-CM

## 2011-10-21 MED ORDER — TIZANIDINE HCL 2 MG PO TABS *I*
4.0000 mg | ORAL_TABLET | Freq: Two times a day (BID) | ORAL | Status: DC
Start: 2011-10-21 — End: 2011-12-16

## 2011-10-21 MED ORDER — TIZANIDINE HCL 2 MG PO TABS *I*
4.0000 mg | ORAL_TABLET | Freq: Two times a day (BID) | ORAL | Status: DC
Start: 2011-10-21 — End: 2011-10-21
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
## 2011-10-21 NOTE — Patient Instructions (Addendum)
-  Start Tizanidine 2mg  at night for 3 days and then increase to 2mg  two times a day, then after three more days increase to 4 mg two times a day. Rx has been given    -Recommend obtaining bloodwork, CMP, has been ordered.      -Recommend Dexa Scan, which has been ordered    -Recommend Renal U/S, which has been ordered    -Please follow up in 6 months with Dr. Baltazar Apo.      Valda Lamb, MD  2:01 PM  10/21/2011

## 2011-10-22 ENCOUNTER — Telehealth: Payer: Self-pay | Admitting: Internal Medicine

## 2011-10-22 ENCOUNTER — Other Ambulatory Visit: Payer: Self-pay | Admitting: Internal Medicine

## 2011-10-22 DIAGNOSIS — R32 Unspecified urinary incontinence: Secondary | ICD-10-CM

## 2011-10-22 MED ORDER — NON-SYSTEM MEDICATION *A*
1.0000 | Status: DC | PRN
Start: 2011-10-22 — End: 2012-02-17

## 2011-10-22 NOTE — Telephone Encounter (Signed)
Requesting -  Add a Foley Catheter Tray Kit/  [name] (mother) will call back with a pharmacy.

## 2011-10-22 NOTE — Telephone Encounter (Signed)
Pt would like to visit her brother.  He is incarcerated.  She needs a note stating what her medical statis/condition is.  She will be asked questions and ask to do certain things at guard station.  She needs a note to carry on her person so they are aware she is not refusing.  She may not be able to follow direction well.  Please call mother if any questions.

## 2011-10-27 ENCOUNTER — Encounter: Payer: Self-pay | Admitting: Internal Medicine

## 2011-11-03 ENCOUNTER — Telehealth: Payer: Self-pay | Admitting: Internal Medicine

## 2011-11-03 NOTE — Telephone Encounter (Signed)
Cindy Ochoa from [place], patient has her [employer] re-certification due.  Please fill out the paper work and call Cindy Ochoa at [phone] ext [phone] once it is ready to be picked up.  She will be dropping off the forms today.

## 2011-11-04 NOTE — Telephone Encounter (Signed)
Dr Julian Reil unavailable - gave forms to [facility] who saw patient last to complete.

## 2011-11-11 ENCOUNTER — Telehealth: Payer: Self-pay | Admitting: Internal Medicine

## 2011-11-11 NOTE — Telephone Encounter (Signed)
Dr Daphine Deutscher to complete form.

## 2011-11-11 NOTE — Telephone Encounter (Signed)
FYI   -  She is the Child psychotherapist from [place]. ( fax [phone])She had dropped off a form for [employer] to be filled in last week.  There was not a medical release on file. She will be sending a release by fax today.  Please have form filled in and contact her when ready.

## 2011-11-11 NOTE — Telephone Encounter (Signed)
Duplicate encounter - Form given to Dr. Daphine Deutscher to complete

## 2011-11-12 NOTE — Telephone Encounter (Signed)
Form completed and Social Worker to pick up on [employer]

## 2011-11-24 ENCOUNTER — Telehealth: Payer: Self-pay | Admitting: Internal Medicine

## 2011-11-24 DIAGNOSIS — G8254 Quadriplegia, C5-C7 incomplete: Secondary | ICD-10-CM

## 2011-11-24 DIAGNOSIS — G8929 Other chronic pain: Secondary | ICD-10-CM

## 2011-11-24 DIAGNOSIS — F3289 Other specified depressive episodes: Secondary | ICD-10-CM

## 2011-11-24 DIAGNOSIS — I959 Hypotension, unspecified: Secondary | ICD-10-CM

## 2011-11-24 DIAGNOSIS — N390 Urinary tract infection, site not specified: Secondary | ICD-10-CM

## 2011-11-24 NOTE — Telephone Encounter (Signed)
Patient was reached .  Reports that she is currently living alone; will be moving to another apartment later this week.  States she does not feel that she can come for an appointment now or after she moves.  Asking for a new transportation form to be completed so that she can start coming for appointments on a stretcher.    Calling today to ask if she can get a prescription for an antibiotic:   Pt noticing that her urine is cloudy and today she was sweating more than usual . No change in her muscle spasms ( states they usually get bad when she has an infection) , no fever or pain.    INTERVENTION:  We discussed the possibility of having a urine to go to the lab; pt does not feel this is possible as she does not have a container, and no one to take the urine to the lab.    PLAN: Will review with PCP.

## 2011-11-24 NOTE — Telephone Encounter (Signed)
Patient states she is moving and her new residence does not have a ramp, so the only way she can leave the house is on a stretcher. Patient states this may affect whether or not MD would like her to come in to appointments. Patient mentions she is suffering UTI symptoms and is inquiring how to proceed since traveling is difficult for her.

## 2011-11-24 NOTE — Telephone Encounter (Signed)
Message left for the pt that we need more information,.

## 2011-11-25 NOTE — Telephone Encounter (Signed)
Because she has a suprapubic catheter, it is extremely important that we know whether or not she has an actual infection.  She had a mixture of multiple organisms in September.  In August, she had E. Coli and Proteus.  If we give her an antibiotic, we may 1) not cover an offending organism and have no way to know that, or 2) if no infection is present, give any colonizing bacteria resistance to an antibiotic.  I do not want to just give her an antibiotic without a specimen.  She has chronic, intermittent sweats related to autonomic insufficiency.  The fact that her spasms are not worse argues against a UTI.    It is very hard to get Cindy Ochoa to come in because of her quad status.  Can we work on getting a home urine done?  We need her catheter clamped and then a specimen obtained.  NOT from the bag itself.  This is not the best way to get a pure bladder specimen, but it's the easiest.  I will place the order.  Let me know what she says.    Andrey Campanile, M.D., Faculty, [facility]  11/25/2011, 11:15 AM

## 2011-11-25 NOTE — Telephone Encounter (Signed)
Reviewed information with the patient and the pt's mother.  They agreed to obtain a clean specimen  And take to an outside lab.

## 2011-12-08 ENCOUNTER — Other Ambulatory Visit: Payer: Self-pay | Admitting: Gastroenterology

## 2011-12-08 ENCOUNTER — Emergency Department
Admission: EM | Admit: 2011-12-08 | Disposition: A | Discharge status: Home / Self Care | Payer: Self-pay | Source: Ambulatory Visit | Attending: Emergency Medicine | Admitting: Emergency Medicine

## 2011-12-08 ENCOUNTER — Encounter: Payer: Self-pay | Admitting: Emergency Medicine

## 2011-12-08 HISTORY — DX: Unspecified sexually transmitted disease: A64

## 2011-12-08 LAB — RUQ PANEL (ED ONLY)
ALT: 11 U/L (ref 0–35)
Albumin: 4.5 g/dL (ref 3.5–5.2)
Amylase: 48 U/L (ref 28–100)
Bilirubin,Direct: <0.2 mg/dL (ref 0.0–0.3)
Bilirubin,Total: 0.5 mg/dL (ref 0.0–1.2)
Lipase: 34 U/L (ref 13–60)
Total Protein: 7.9 g/dL — ABNORMAL HIGH (ref 6.3–7.7)

## 2011-12-08 LAB — HOLD SST

## 2011-12-08 LAB — CBC AND DIFFERENTIAL
Baso # K/uL: 0 10*3/uL (ref 0.0–0.1)
Basophil %: 0 % (ref 0.1–1.2)
Eos # K/uL: 0 10*3/uL (ref 0.0–0.4)
Eosinophil %: 0 % — ABNORMAL LOW (ref 0.7–5.8)
Hematocrit: 44 % (ref 34–45)
Hemoglobin: 14.5 g/dL (ref 11.2–15.7)
Lymph # K/uL: 0.3 10*3/uL — ABNORMAL LOW (ref 1.2–3.7)
Lymphocyte %: 5.1 % — ABNORMAL LOW (ref 19.3–51.7)
MCV: 92 fL (ref 79–95)
Mono # K/uL: 0.4 10*3/uL (ref 0.2–0.9)
Monocyte %: 6.6 % (ref 4.7–12.5)
Neut # K/uL: 5.7 10*3/uL (ref 1.6–6.1)
Platelets: 54 10*3/uL — ABNORMAL LOW (ref 160–370)
RBC: 4.8 MIL/uL (ref 3.9–5.2)
RDW: 15.9 % — ABNORMAL HIGH (ref 11.7–14.4)
Seg Neut %: 88.3 % — ABNORMAL HIGH (ref 34.0–71.1)
WBC: 6.5 10*3/uL (ref 4.0–10.0)

## 2011-12-08 LAB — POCT URINE PREGNANCY: Lot #: 206830

## 2011-12-08 LAB — BASIC METABOLIC PANEL
Anion Gap: 14 (ref 7–16)
CO2: 20 mmol/L (ref 20–28)
Calcium: 9.4 mg/dL (ref 8.8–10.2)
Chloride: 105 mmol/L (ref 96–108)
Creatinine: 0.67 mg/dL (ref 0.51–0.95)
GFR,Black: >59 *
GFR,Caucasian: >59 *
Glucose: 109 mg/dL — ABNORMAL HIGH (ref 60–99)
Lab: 11 mg/dL (ref 6–20)
Sodium: 139 mmol/L (ref 133–145)

## 2011-12-08 LAB — HOLD GREEN WITH GEL

## 2011-12-08 LAB — HOLD LAVENDER

## 2011-12-08 LAB — POTASSIUM,PLASMA: Potassium,Plasma: 3.6 mmol/L (ref 3.4–4.7)

## 2011-12-08 MED ORDER — IBUPROFEN 400 MG PO TABS *I*
800.0000 mg | ORAL_TABLET | Freq: Once | ORAL | Status: AC
Start: 2011-12-08 — End: 2011-12-08
  Administered 2011-12-08: 800 mg via ORAL
  Filled 2011-12-08: qty 2

## 2011-12-08 MED ORDER — ACETAMINOPHEN 325 MG PO TABS *I*
650.0000 mg | ORAL_TABLET | Freq: Once | ORAL | Status: AC
Start: 2011-12-08 — End: 2011-12-08
  Administered 2011-12-08: 650 mg via ORAL
  Filled 2011-12-08: qty 2

## 2011-12-08 MED ORDER — SODIUM CHLORIDE 0.9 % IV BOLUS *I*
1000.0000 mL | Freq: Once | Status: AC
Start: 2011-12-09 — End: 2011-12-09
  Administered 2011-12-09: 1000 mL via INTRAVENOUS

## 2011-12-08 MED ORDER — SODIUM CHLORIDE 0.9 % IV BOLUS *I*
1000.0000 mL | Freq: Once | Status: AC
Start: 2011-12-08 — End: 2011-12-08
  Administered 2011-12-08: 1000 mL via INTRAVENOUS

## 2011-12-09 LAB — GRAM STAIN

## 2011-12-09 LAB — EKG 12-LEAD
P: 69 degrees
QRS: 72 degrees
Rate: 143 {beats}/min
Severity: ABNORMAL
Severity: ABNORMAL
Statement: ABNORMAL
Statement: ABNORMAL
T: -85 degrees

## 2011-12-09 LAB — URINALYSIS WITH MICROSCOPIC
Nitrite,UA: NEGATIVE
Protein,UA: 30 mg/dL — AB
RBC,UA: 30 /hpf — AB (ref 0–2)
Specific Gravity,UA: >1.06 — ABNORMAL HIGH (ref 1.002–1.030)
WBC,UA: 3 /hpf (ref 0–5)
pH,UA: 5 (ref 5.0–8.0)

## 2011-12-09 MED ORDER — SODIUM CHLORIDE 0.9 % IV BOLUS *I*
1000.0000 mL | Freq: Once | Status: DC
Start: 2011-12-09 — End: 2011-12-09

## 2011-12-09 MED ORDER — CIPROFLOXACIN HCL 500 MG PO TABS *I*
500.0000 mg | ORAL_TABLET | Freq: Once | ORAL | Status: AC
Start: 2011-12-09 — End: 2011-12-09
  Administered 2011-12-09: 500 mg via ORAL
  Filled 2011-12-09: qty 1

## 2011-12-09 NOTE — Discharge Instructions (Signed)
You were seen here for your abdominal pain, vomiting and fevers. No sign of infection was found in your blood, urine, lungs or abdomen. You were mildly dehydrated and fluid were replaced via IV. We have discharged you to follow up with your primary care doctor in the next 1-2 days. If your symptoms worsen, you continue to have fevers and/or your abdominal pain and vomiting return, please return to the ED for further evaluation.

## 2011-12-11 ENCOUNTER — Telehealth: Payer: Self-pay | Admitting: Internal Medicine

## 2011-12-11 LAB — AEROBIC CULTURE

## 2011-12-13 ENCOUNTER — Encounter: Payer: Self-pay | Admitting: Internal Medicine

## 2011-12-14 ENCOUNTER — Encounter: Payer: Self-pay | Admitting: Internal Medicine

## 2011-12-14 DIAGNOSIS — L89159 Pressure ulcer of sacral region, unspecified stage: Secondary | ICD-10-CM | POA: Insufficient documentation

## 2011-12-14 HISTORY — DX: Pressure ulcer of sacral region, unspecified stage: L89.159

## 2011-12-14 LAB — BLOOD CULTURE: Bacterial Blood Culture: 0

## 2011-12-16 ENCOUNTER — Ambulatory Visit: Payer: Self-pay | Admitting: Internal Medicine

## 2011-12-16 ENCOUNTER — Encounter: Payer: Self-pay | Admitting: Internal Medicine

## 2011-12-16 VITALS — BP 94/72 | HR 76 | Temp 96.8°F

## 2011-12-16 DIAGNOSIS — J40 Bronchitis, not specified as acute or chronic: Secondary | ICD-10-CM

## 2011-12-16 LAB — POCT GLUCOSE: Glucose POCT: 83 mg/dL (ref 60–99)

## 2011-12-16 MED ORDER — SULFAMETHOXAZOLE-TRIMETHOPRIM 800-160 MG PO TABS *I*
1.0000 | ORAL_TABLET | Freq: Two times a day (BID) | ORAL | Status: DC
Start: 2011-12-16 — End: 2012-03-04

## 2011-12-16 MED ORDER — GUAIFENESIN 600 MG PO TB12 *I*
600.0000 mg | ORAL_TABLET | Freq: Two times a day (BID) | ORAL | Status: DC
Start: 2011-12-16 — End: 2012-02-18

## 2011-12-16 MED ORDER — FLUTICASONE-SALMETEROL 250-50 MCG/ACT IN AEPB *I*
1.0000 | INHALATION_SPRAY | Freq: Two times a day (BID) | RESPIRATORY_TRACT | Status: DC
Start: 2011-12-16 — End: 2012-07-11

## 2011-12-31 ENCOUNTER — Encounter: Payer: Self-pay | Admitting: Internal Medicine

## 2012-01-01 ENCOUNTER — Encounter: Payer: Self-pay | Admitting: Internal Medicine

## 2012-01-01 DIAGNOSIS — Z0000 Encounter for general adult medical examination without abnormal findings: Secondary | ICD-10-CM | POA: Insufficient documentation

## 2012-01-12 ENCOUNTER — Other Ambulatory Visit: Payer: Self-pay | Admitting: Internal Medicine

## 2012-01-12 ENCOUNTER — Ambulatory Visit: Payer: Self-pay | Admitting: Surgery

## 2012-01-12 MED ORDER — BISACODYL 10 MG RE SUPP *I*
10.0000 mg | Freq: Every day | RECTAL | Status: DC | PRN
Start: 2012-01-12 — End: 2012-02-18

## 2012-01-14 ENCOUNTER — Encounter: Payer: Self-pay | Admitting: Gastroenterology

## 2012-01-20 ENCOUNTER — Ambulatory Visit: Payer: Self-pay | Admitting: Internal Medicine

## 2012-01-20 ENCOUNTER — Other Ambulatory Visit: Payer: Self-pay | Admitting: Internal Medicine

## 2012-01-20 MED ORDER — NON-SYSTEM MEDICATION *A*
Status: DC
Start: 2012-01-20 — End: 2012-08-22

## 2012-01-27 ENCOUNTER — Ambulatory Visit: Payer: Self-pay | Admitting: Internal Medicine

## 2012-01-29 ENCOUNTER — Telehealth: Payer: Self-pay | Admitting: Internal Medicine

## 2012-01-29 NOTE — Telephone Encounter (Signed)
Patient is calling to ask Luster Landsberg the Child psychotherapist for power of attorney paper work.
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
## 2012-02-04 NOTE — Telephone Encounter (Signed)
Please call patient and schedule her to be seen in clinic by her medical provider or the NP.     Please ask patient if the form indicates she needs to have been seen within 30 days of the forms completion.     Patient does not need to see Child psychotherapist for this form to be completed.

## 2012-02-04 NOTE — Telephone Encounter (Signed)
Pt says she would like an appt to have "aid service papers" completed, [phone]

## 2012-02-04 NOTE — Telephone Encounter (Signed)
PT WAS CALLED; APPOINTMENT SCHEDULED.

## 2012-02-09 ENCOUNTER — Ambulatory Visit: Payer: Self-pay | Admitting: Physical Medicine and Rehabilitation

## 2012-02-09 ENCOUNTER — Ambulatory Visit: Payer: Self-pay | Admitting: Internal Medicine

## 2012-02-09 ENCOUNTER — Ambulatory Visit: Admit: 2012-02-09 | Payer: Self-pay | Source: Ambulatory Visit | Admitting: Urology

## 2012-02-09 ENCOUNTER — Telehealth: Payer: Self-pay | Admitting: Internal Medicine

## 2012-02-09 NOTE — Telephone Encounter (Signed)
Patient's mother is calling to get a stretcher for the patient because the patient has moved and where she lives there is no ramp for her to get out with her wheelchair in order to go to doctor's appointments.

## 2012-02-10 ENCOUNTER — Encounter: Payer: Self-pay | Admitting: Gastroenterology

## 2012-02-10 NOTE — Telephone Encounter (Signed)
Pt was called; states Medicaid will not transport her by stretcher.  New transportation form submitted to Medicaid.

## 2012-02-17 ENCOUNTER — Other Ambulatory Visit: Payer: Self-pay | Admitting: Internal Medicine

## 2012-02-17 ENCOUNTER — Encounter: Payer: Self-pay | Admitting: Gastroenterology

## 2012-02-17 DIAGNOSIS — R32 Unspecified urinary incontinence: Secondary | ICD-10-CM

## 2012-02-17 MED ORDER — NON-SYSTEM MEDICATION *A*
Status: DC
Start: 2012-02-17 — End: 2012-12-15

## 2012-02-17 NOTE — Progress Notes (Deleted)
Asked to write prescriptions for a Semi-Electric Bed with a mattress to replace her old non-electric hospital bed.  [name] has investigated this and she does qualify.  Certainly, frequent positioning changes with an electric bed will help prevent recurrent decubitus ulcers -- and she has had many of those in her buttocks and heels.    Will send a copy of her problem list, history and this note to confirm scripts.  Form with questions completed.    Andrey Campanile, M.D., Faculty, [facility]  02/17/2012, 11:51 AM

## 2012-02-17 NOTE — Progress Notes (Signed)
Asked to write prescriptions for a Semi-Electric Bed with a mattress to replace her old non-electric hospital bed.  [name] has investigated this and she does qualify.  Certainly, frequent positioning changes with an electric bed will help prevent recurrent decubitus ulcers -- and she has had many of those in her buttocks and heels.    Will send a copy of her problem list, history and this note to confirm scripts.  Form with questions completed.    Andrey Campanile, M.D., Faculty, [facility]  02/17/2012, 11:49 AM

## 2012-02-18 ENCOUNTER — Other Ambulatory Visit: Payer: Self-pay | Admitting: Internal Medicine

## 2012-02-18 DIAGNOSIS — N390 Urinary tract infection, site not specified: Secondary | ICD-10-CM

## 2012-02-19 MED ORDER — IBUPROFEN 800 MG PO TABS *I*
800.0000 mg | ORAL_TABLET | Freq: Three times a day (TID) | ORAL | Status: DC | PRN
Start: 2012-02-18 — End: 2012-03-18

## 2012-02-19 MED ORDER — GUAIFENESIN 600 MG PO TB12 *I*
600.0000 mg | ORAL_TABLET | Freq: Two times a day (BID) | ORAL | Status: DC
Start: 2012-02-18 — End: 2012-07-11

## 2012-02-19 MED ORDER — BISACODYL 10 MG RE SUPP *I*
10.0000 mg | Freq: Every day | RECTAL | Status: DC | PRN
Start: 2012-02-18 — End: 2012-04-27

## 2012-02-22 ENCOUNTER — Telehealth: Payer: Self-pay | Admitting: Internal Medicine

## 2012-02-22 NOTE — Telephone Encounter (Signed)
Called patient, left vm for pt to call back and schedule appt.

## 2012-02-22 NOTE — Telephone Encounter (Signed)
Just talked to [place] - she had the Supervisor call her and they will provide stretcher service for the next 6 weeks and she is hoping to find different living arrangements that will be wheelchair accessible.  Would like to talk to Child psychotherapist.

## 2012-02-22 NOTE — Telephone Encounter (Signed)
Transportation form does have stretcher checked but in past she has always had wheelchair taking her out of her residence.  She now lives somewhere without a ramp.  Per Medicaid supervisor [place] will not incur this expense .  He suggests Adult Protective be contacted stating that she can not get out of her residence.

## 2012-02-22 NOTE — Telephone Encounter (Signed)
Patient is calling to schedule an urgent care appointment for a possible UTI.

## 2012-02-22 NOTE — Telephone Encounter (Signed)
Pt has appt on 2/27 @ 10am with Dr Flonnie Hailstone.  She called for transportation and states she needs a Doctor, general practice, however her paperwork is filed for a wheelchair [employer].  She needs our office to call to switch her ride to stretcher.

## 2012-02-22 NOTE — Telephone Encounter (Signed)
2/27 @ 10am with Dr Flonnie Hailstone.

## 2012-02-22 NOTE — Telephone Encounter (Signed)
Patient is calling to make an urgent call appointment for possible UTI

## 2012-02-24 ENCOUNTER — Ambulatory Visit: Payer: Self-pay | Admitting: Internal Medicine

## 2012-02-24 VITALS — BP 98/70 | HR 82 | Temp 96.6°F | Ht 65.0 in | Wt 190.0 lb

## 2012-02-24 DIAGNOSIS — N390 Urinary tract infection, site not specified: Secondary | ICD-10-CM

## 2012-02-24 DIAGNOSIS — L89159 Pressure ulcer of sacral region, unspecified stage: Secondary | ICD-10-CM

## 2012-02-24 LAB — URINALYSIS WITH MICROSCOPIC
Ketones, UA: NEGATIVE
Nitrite,UA: NEGATIVE
Protein,UA: 30 mg/dL — AB
RBC,UA: 1 /hpf (ref 0–2)
Specific Gravity,UA: 1.005 (ref 1.002–1.030)
WBC,UA: 16 /hpf — ABNORMAL HIGH (ref 0–5)
pH,UA: 9 — ABNORMAL HIGH (ref 5.0–8.0)

## 2012-02-24 MED ORDER — CIPROFLOXACIN HCL 500 MG PO TABS *I*
500.0000 mg | ORAL_TABLET | Freq: Two times a day (BID) | ORAL | Status: DC
Start: 2012-02-24 — End: 2012-03-04

## 2012-02-24 MED ORDER — NON-SYSTEM MEDICATION *A*
Status: DC
Start: 2012-02-24 — End: 2012-02-25

## 2012-02-24 NOTE — Patient Instructions (Signed)
Start antibiotics, will call in to [facility] pharmacy.    Placed the Allevyn dressing over the ulcer.  Change when dirty

## 2012-02-24 NOTE — Progress Notes (Addendum)
Reason for Visit: Concern for UTI    HPI: 26 yo female with h/o quadriparesis, neurogenic bladder, h/o recurrent UTI, autonomic disorder presented to clinic toda concering that she may have recurrent UTI.  Companied by her aid.  1. UTI (lower urinary tract infection)   -- report increase spasm in her LE, which tend to be her sign of recurrent UTI for the past week  -- denies any fever or chills but report excessive sweating  -- have chronic foley of which have tending to leak  -- foley last changed yesterday   2. Decubitus ulcer of sacral region   -- reported have 3 old ulcer previously healed now re-opend         PMH/PSH:  History reviewed and changes updated to eRecord accordingly.    Allergies:  Allergies   Allergen Reactions    Heparin      Thrombocytopenia;     Nitrofurantoin      Vomiting    Vancomycin Hives     hives 2006 but tolerated Rx in 2010         Medications: see electronic list      Vitals:  Filed Vitals:    02/24/12 1028   BP: 98/70   Pulse: 82   Temp: 35.9 C (96.6 F)   Height: 1.651 m (5\' 5" )   Weight: 86.183 kg (190 lb)       Physical Exam:  General: NAD, AO x 3, diaphoretic  HEENT: MMM  CVS: RRR, normal S1, S2, no m/r/g  Pulm: CTAB, no wheezes, rales, rhonchi  Abd: +BS, soft, distended  Skin: 3 x 3-5 mm shallow ulcer located on right gluteal region. No sign of infection.  Minimal drainage.    Assessment and Plan:      1. UTI (lower urinary tract infection)  -- Urinalysis with microscopic, Aerobic culture  -- start Cipro, plan for 7 days course  -- adjust abx treatment according to final culture result   2. Decubitus ulcer of sacral region  -- Allevn Hdrocellular adhesive dressing 3"x3" for berrier  -- if worsening, will need to be seen at the wound clinic       Ihor Gully, MD on 02/24/2012 at 7:54 PM    [facility] Faculty Attestation    I discussed the patient with the above resident physician at the time of the visit.  I agree with her assessment and plan of care as  documented.    Covered:    Recurrent UTIs in a woman with chronic bacteriuria from a chronic indwelling foley.  It is difficult to tell when she has a UTI since she does not have any sensation from the shoulders down.  Usually she has more leg spasms with an infection.  Diaphoresis is chronic and secondary to autonomic insufficiency.  Check culture, but start antibiotics guided by previous cultures (polymicrobial).    New shallow sacral decubitus ulcers -- agree with aggressive management as outline above given her history of complicated ulcers.  Since her foley still leaks, may need to readdress suprapubic foley.    Andrey Campanile, M.D. 02/25/2012, 2:03 PM

## 2012-02-25 ENCOUNTER — Encounter: Payer: Self-pay | Admitting: Internal Medicine

## 2012-02-25 ENCOUNTER — Telehealth: Payer: Self-pay | Admitting: Internal Medicine

## 2012-02-25 ENCOUNTER — Other Ambulatory Visit: Payer: Self-pay | Admitting: Internal Medicine

## 2012-02-25 LAB — AEROBIC CULTURE: Aerobic Culture: 0

## 2012-02-25 MED ORDER — NON-SYSTEM MEDICATION *A*
Status: DC
Start: 2012-02-25 — End: 2012-02-26

## 2012-02-25 NOTE — Telephone Encounter (Signed)
ADDITIONAL INFORMATION:     Per patient the issue surrounding her transportation has been resolved  Per patient on 11/28/11 patient moved into her current apartment that is not handicap accessible and she can not leave her appointment unless it is by stretcher. Patient reports that she moved into this apartment because she was evicted from her last apartment. Patient reports that she and the tenant who lived above her "kept getting into it" and the landlord decided to evict them both.    PLAN:     Patient reports that Medicaid has approved a change in her transport to Stretcher for the next six weeks.     Per patient at this time our office does not need to do anything

## 2012-02-25 NOTE — Telephone Encounter (Signed)
ADDITIONAL INFORMATION:       Writer spoke with patient who indicates that in December she moved into this new apartment and her mother got her own apartment. Since moving into this apartment patient has developed some new sores and she is asking that our office write a letter indicating that her medical condition has changed and that it would be to her benefit to have her mother released from her lease so that she can move into patient's apartment to help take care of her .        Writer asked patient if having her mother move back into her home would that impact the services (aides) that she gets. Patient indicates that she is on the CD pass program, does her own hiring of providers and that her mother moving back in would not impact her care or services.    PLAN:        Clinical research associate to  Share this request with the attending, Dr. Daphine Deutscher         Patient to be notified of outcome.

## 2012-02-25 NOTE — Telephone Encounter (Signed)
Patient is requesting to speak to the social worker, Luster Landsberg.

## 2012-02-25 NOTE — Telephone Encounter (Signed)
Letter written.    Andrey Campanile, MD

## 2012-02-26 ENCOUNTER — Encounter: Payer: Self-pay | Admitting: Internal Medicine

## 2012-02-26 ENCOUNTER — Other Ambulatory Visit: Payer: Self-pay | Admitting: Internal Medicine

## 2012-02-26 MED ORDER — NON-SYSTEM MEDICATION *A*
Status: DC
Start: 2012-02-26 — End: 2012-12-15

## 2012-02-26 NOTE — Telephone Encounter (Signed)
Prescription sent to the pharmacy.

## 2012-02-26 NOTE — Telephone Encounter (Signed)
Prescription was faxed to [facility]

## 2012-02-26 NOTE — Telephone Encounter (Signed)
Patient is requesting adhesive patches for her bed sores.  This is a new request so could not reorder from her 'Meds & Orders' list.  Patient did request that Jacki Cones call her if there is a problem with this.

## 2012-03-01 NOTE — Telephone Encounter (Signed)
ACTION TAKEN:        Letter written by Dr. Daphine Deutscher        Printed and mailed via USPS(w/stamp) on 02/29/12 by writer        Patient left a voice mail indicating that the letter was being mailed to her address.

## 2012-03-04 ENCOUNTER — Ambulatory Visit: Payer: Self-pay | Admitting: Physical Medicine and Rehabilitation

## 2012-03-04 ENCOUNTER — Encounter: Payer: Self-pay | Admitting: Gastroenterology

## 2012-03-04 ENCOUNTER — Encounter: Payer: Self-pay | Admitting: Physical Medicine and Rehabilitation

## 2012-03-04 VITALS — BP 107/59 | HR 93 | Temp 97.7°F

## 2012-03-04 MED ORDER — OXYBUTYNIN CHLORIDE 5 MG PO TB24 *I*
5.0000 mg | ORAL_TABLET | Freq: Every day | ORAL | Status: DC
Start: 2012-03-04 — End: 2012-03-14

## 2012-03-04 MED ORDER — CIPROFLOXACIN HCL 500 MG PO TABS *I*
500.0000 mg | ORAL_TABLET | Freq: Two times a day (BID) | ORAL | Status: DC
Start: 2012-03-04 — End: 2012-03-18

## 2012-03-04 NOTE — Progress Notes (Signed)
Subjective: Urethral leakage     Patient ID: Cindy Ochoa is a 26 y.o. female seen in the urology clinic for neurogenic bladder dysfunction and urethral leakage surrounding Foley.    HPI patient has neurogenic bladder with tetraplegia and impaired hand function was bladder is managed with chronic Foley catheter.  Several years ago she had urethral incompetence and had surgery by Dr. Cheyenne Adas for urethral ligation 2010.  She reports that for approximately one year after the surgery she remains dry with Foley catheter in place.  For the last 2 years, she has been "popping out catheters" every 2 days.  She is wet most of the time, that has significantly caused a problem to her skin with sacral wounds.    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  Current Outpatient Prescriptions on File Prior to Visit   Medication Sig Dispense Refill   . bisacodyl (DULCOLAX) 10 MG suppository Place 1 suppository (10 mg total) rectally daily as needed for Constipation  12 suppository  5   . guaiFENesin (MUCINEX) 600 MG 12 hr tablet Take 1 tablet (600 mg total) by mouth 2 times daily   Swallow whole. Do not crush, break, or chew.  30 tablet  1   . ibuprofen (ADVIL,MOTRIN) 800 MG tablet Take 1 tablet (800 mg total) by mouth 3 times daily as needed for Pain  50 tablet  5   . Non-System Medication Mattress, [employer]  Use as directed.  Dx: 344.04, 596.54, 707.03, 707.05, 788.33  1 each  0   . Non-System Medication French Foley Catheters - Silicone  Diagnosis 596.54, 344.04  Fax to [facility]  10 each  6   . fluticasone-salmeterol (ADVAIR DISKUS) 250-50 MCG/DOSE diskus inhaler Inhale 1 puff into the lungs 2 times daily    60 each  0   . Skin Protectants, Misc.  OINT Apply 1 Tube topically 2 times daily    1 Tube  11   . sertraline (ZOLOFT) 50 MG tablet Take 1 tablet (50 mg total) by mouth daily    30 tablet  3   . hydrOXYzine HCl (ATARAX) 25 MG tablet Take 1 tablet (25 mg total) by mouth 3 times daily as needed for Itching   One tablet daily prn for anxiety  30 tablet  1   . incontinence supply disposable Large pull ups - use up to 5 x daily  Diagnosis:  Quadriparesis 344.04 Fax and mail to [name]  150 each  5   . Non-System Medication One Nurse, adult  Diagnosis:  Quadriplegic ICD-9 344.04  1 each  0   . disposable gloves 1 box Disposable Medium size gloves  100 each  6   . docusate sodium (COLACE) 100 MG capsule TAKE 1 CAPSULE TWICE DAILY  60  5   . Non-System Medication Allevyn Hydrocellular adhesive dressing  3" x 3" Dx 707.03 Decub ulcer of Sacral region. Disp 3 box of 10  Pt changing dressing daily  10 each  0   . Non-System Medication Hospital Bed, Semi-Electric (Head and Foot Adjustments) w/ any type side rail  Dx: 344.04, 596.54, 707.03, 707.05, 788.33  1 each  0   . norethindrone (MICRONOR) 0.35 MG tablet Take 1 tablet (0.35 mg total) by mouth daily    28 tablet  5   .  medroxyPROGESTERone (PROVERA) 10 MG tablet Take 10 mg by mouth daily           . baclofen (LIORESAL) 10 MG tablet 2 tablet 3 times per day       180 tablet  1     Cindy Ochoa is allergic to heparin; nitrofurantoin; and vancomycin.    Review of Systems   Constitutional: Negative.    Genitourinary:        See HPI   Neurological:        Frequent lower extremity and upper extremity spasms             Objective: BP 107/59  Pulse 93  Temp 36.5 C (97.7 F)  No results found for this or any previous visit (from the past 24 hour(s)).     Physical Exam   Constitutional: She appears well-developed and well-nourished.        Obese young [place] American female seen on a stretcher   Abdominal: Soft. She exhibits no distension.   Genitourinary:        When initially examined, and the inflated balloon was noted at  the urethra.  The urethra is dark pink but did not appear to be excessively dilated.   Neurological:        Bilateral lower extremities have increased tone with easily induced spasms.   Skin: She is diaphoretic.     Imaging: Most recent contrasted CT from December 2012 showed no hydronephrosis.  Labs creatinine 0.67 in December 2012        Assessment:      Neurogenic bladder managed with Foley catheter.  There is likely a component of bladder spasms and muscle spasms that are contributing to the Foley being pushed out.  It is uncertain if there is urethral incompetence at this point.  Stable renal function       Plan:      We'll plan to perform urodynamic testing to evaluate bladder pressures and capacity.  We will give her prophylactic antibiotics beginning 2 days prior to this procedure.  When she returns to clinic, pelvic exam will be performed with Dr. Lisabeth Pick.  We'll begin anticholinergics at oxybutynin 5 mg 3 times a day.    The catheter was secured to the leg and she and her aide were instructed on keeping this anchoring device in place at all times.  She strongly advised to return to physical medicine and rehab to address muscle spasticity.  She's frustrated with this as she has been on baclofen without effect and does not wish to go back on it.  We discussed that there were other agents that could be beneficial.

## 2012-03-04 NOTE — Patient Instructions (Signed)
Keep catheter secured.   Start antibiotic 2 days before the test.   Keep the catheter secured all the time.

## 2012-03-09 ENCOUNTER — Encounter: Payer: Self-pay | Admitting: Gastroenterology

## 2012-03-09 ENCOUNTER — Ambulatory Visit: Payer: Self-pay | Admitting: Surgery

## 2012-03-14 ENCOUNTER — Other Ambulatory Visit: Payer: Self-pay | Admitting: Physical Medicine and Rehabilitation

## 2012-03-14 MED ORDER — OXYBUTYNIN CHLORIDE 5 MG PO TABS *I*
5.0000 mg | ORAL_TABLET | Freq: Three times a day (TID) | ORAL | Status: DC
Start: 2012-03-14 — End: 2012-05-23

## 2012-03-18 ENCOUNTER — Encounter: Payer: Self-pay | Admitting: Emergency Medicine

## 2012-03-18 ENCOUNTER — Emergency Department
Admission: EM | Admit: 2012-03-18 | Disposition: A | Discharge status: Home / Self Care | Payer: Self-pay | Source: Ambulatory Visit | Attending: Emergency Medicine | Admitting: Emergency Medicine

## 2012-03-18 MED ORDER — AQUACEL HYDROFIBER 4"X4" EX PADS *A*
MEDICATED_PAD | CUTANEOUS | Status: DC
Start: 2012-03-18 — End: 2012-03-19

## 2012-03-18 MED ORDER — TEGADERM HYDROCOLLOID EX MISC
1.0000 | Freq: Every day | CUTANEOUS | Status: DC
Start: 2012-03-18 — End: 2012-03-19
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
## 2012-03-18 NOTE — ED Notes (Signed)
Here for bed sores and decrease PO intake

## 2012-03-19 LAB — GRAM STAIN

## 2012-03-19 MED ORDER — AQUACEL HYDROFIBER 4"X4" EX PADS *A*
MEDICATED_PAD | CUTANEOUS | Status: AC
Start: 2012-03-19 — End: 2012-04-18

## 2012-03-19 MED ORDER — TEGADERM HYDROCOLLOID EX MISC
1.0000 | Freq: Every day | CUTANEOUS | Status: DC
Start: 2012-03-19 — End: 2012-12-15

## 2012-03-19 NOTE — ED Provider Progress Notes (Signed)
ED Provider Progress Note     Wound culture sent.  Ulcer cleaned and bandaged w/ tegaderm hydrocolloid (per prev wound care notes) by myself and Dr. Briscoe Deutscher.  Will dfer antibiotics at this time as wound looks ok.  Pt to be given Rx for aquacel and tegaderm/hydrocolloid dressings and discharged.  To f/u with PCP and wound care.     Darnelle Spangle, MD, 03/19/2012, 2:20 AM

## 2012-03-19 NOTE — Discharge Instructions (Signed)
You are being discharged from the ED after being evaluated for a sacral ulcer.  You have had these ulcers before and we have prescribed you supplies that you previously used to help care for these ulcers.  Change the dressings as previously instructed by wound care (every other day, daily if soiled).  You can call the wound clinic Monday to reschedule your appointment and/or with any questions you may have.  You will need to call your PCP on Monday to follow up on the results of your wound culture.  If you develop fever, notice pus draining from wound, or can see bone, please return to the ED.

## 2012-03-21 ENCOUNTER — Telehealth: Payer: Self-pay | Admitting: Internal Medicine

## 2012-03-21 NOTE — Telephone Encounter (Signed)
ReScheduled with Wound Care 3/29 at 12:45 pm [address].

## 2012-03-21 NOTE — Telephone Encounter (Signed)
Stretcher transportation arranged for Wound Care appointment on 3/29 and Urology appointment 4/1.Marland Kitchen  Cindy Ochoa had cancelled appointment with Wound Care 3/27 before going to ED on Friday because she thought she would be admitted.   She will be getting all her wound care supplies through [facility] since [facility] does not carry them any longer for Medicaid patients.

## 2012-03-21 NOTE — Telephone Encounter (Signed)
Cindy Ochoa does not carry wound care supplies.  Patient is to contact  [facility] to get supplies.  She is seeing Wound Care 03/25/12.

## 2012-03-21 NOTE — Telephone Encounter (Signed)
The patient called requesting that Luster Landsberg, social worker contact Medicaid for the patient, for stretcher pick-up, which the patient said she needs for her upcoming appointments on 3/27 and 4/1.  The patient can be reached at [phone], if needed.

## 2012-03-22 ENCOUNTER — Ambulatory Visit: Payer: Self-pay | Admitting: Surgery

## 2012-03-22 ENCOUNTER — Telehealth: Payer: Self-pay | Admitting: Internal Medicine

## 2012-03-22 NOTE — Telephone Encounter (Signed)
Joni Reining from ED called today stating pt's positive wound culture.  Joni Reining is suggesting to review the positive culture and follow up with pt.  nicole states that no antibiotics were given to pt.  Please call nicole with any questions at [phone]

## 2012-03-23 LAB — AEROBIC CULTURE

## 2012-03-25 ENCOUNTER — Telehealth: Payer: Self-pay | Admitting: Internal Medicine

## 2012-03-25 ENCOUNTER — Encounter: Payer: Self-pay | Admitting: Bariatric Surgery

## 2012-03-25 ENCOUNTER — Ambulatory Visit: Payer: Self-pay | Admitting: Bariatric Surgery

## 2012-03-25 VITALS — BP 98/61 | HR 74 | Temp 96.4°F | Ht 65.0 in

## 2012-03-25 DIAGNOSIS — L8993 Pressure ulcer of unspecified site, stage 3: Secondary | ICD-10-CM | POA: Insufficient documentation

## 2012-03-25 HISTORY — DX: Pressure ulcer of unspecified site, stage 3: L89.93

## 2012-03-25 NOTE — Telephone Encounter (Signed)
Patient would like to speak with the nurse who scheduled her transportation. She has an appt today at 1 pm with wound care and needs to know what transportation company to expect.

## 2012-03-25 NOTE — Telephone Encounter (Signed)
Patient was notified that [employer] will be picking her up at 11:45am today.

## 2012-03-25 NOTE — H&P (Signed)
[facility]  New Patient History and Physical Note      Name: Cindy Ochoa, Townson  MRN: [id number]  DOB: [birth year]      Date of Encounter: 03/25/2012      Medical Providers    Referring: Andrey Campanile, MD   PCP: Andrey Campanile, MD       Chief Complaint      Chief Complaint   Patient presents with   . Wound Check     Bilateral buttock pressure ulcer          History of Present Ilness   [name] is a 26 y.o. female. She was referred to the [facility] for evaluation and management of pressure  wound(s) of the bilateral buttock.  Patient is known to the [facility] in distant past for same condition.    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      Allergies   She is allergic to heparin; nitrofurantoin; and vancomycin.      Medications  Current Outpatient Prescriptions   Medication Sig Note   . wound dressing (AQUACEL HYDROFIBER) pad Use 1 times a day as instructed.    . Hydroactive Dressings (TEGADERM HYDROCOLLOID) MISC Apply 1 Film topically daily    . oxybutynin (DITROPAN) 5 MG tablet Take 1  tablet (5 mg total) by mouth 3 times daily    . Non-System Medication Allevyn Hydrocellular adhesive dressing  3" x 3" Dx 707.03 Decub ulcer of Sacral region. Disp 3 box of 10  Pt changing dressing daily 03/18/2012: Never got    . bisacodyl (DULCOLAX) 10 MG suppository Place 1 suppository (10 mg total) rectally daily as needed for Constipation    . guaiFENesin (MUCINEX) 600 MG 12 hr tablet Take 1 tablet (600 mg total) by mouth 2 times daily   Swallow whole. Do not crush, break, or chew.    . Non-System Medication Mattress, [employer]  Use as directed.  Dx: 344.04, 596.54, 707.03, 707.05, 788.33    . Non-System Medication Hospital Bed, Semi-Electric (Head and Foot Adjustments) w/ any type side rail  Dx: 344.04, 596.54, 707.03, 707.05, 788.33    . Non-System Medication French Foley Catheters - Silicone  Diagnosis 596.54, 344.04  Fax to [facility]    . fluticasone-salmeterol (ADVAIR DISKUS) 250-50 MCG/DOSE diskus inhaler Inhale 1 puff into the lungs 2 times daily      . Skin Protectants, Misc. OINT Apply 1 Tube topically 2 times daily      . sertraline (ZOLOFT) 50 MG tablet Take 1 tablet (50 mg total) by mouth daily      . incontinence supply disposable Large pull ups - use up to 5 x daily  Diagnosis:  Quadriparesis 344.04 Fax and mail to [employer]    .  Non-System Medication One Hoyer Lift  Diagnosis:  Quadriplegic ICD-9 344.04    . disposable gloves 1 box Disposable Medium size gloves          Review of Systems   A comprehensive list of systems was reviewed with the patient.    Constitutional: negative.    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    Ears, nose, mouth, throat, and face: negative.    Respiratory: negative.    Cardiovascular: negative.    Gastrointestinal: negative.       Genitourinary:positive for urinary incontinence.    Integument/breast: positive for skin lesion(s) Refer to HPI.    Hematologic/lymphatic: negative.    Musculoskeletal:negative.    Neurological: negative.    Behavioral/Psych: positive  for anxiety.    Endocrine: negative.    Allergic/Immunologic:negative.      Past Surgical History   She has past surgical history that includes Multiple injuries (04/29/2005 ); craniotomy (04/29/2005); Cervical spine surgery (04/29/2005); Cervical spine surgery (05/04/2005); Tracheostomy tube placement (05/15/05); Gastrostomy tube placement (05/15/05); IVC filter (May 2006 ); Urethral plication (12/19/2009); and Left Tibia fracture (06/01/07).      Social History   She reports that she has been smoking Cigarettes.  She has a 1.5 pack-year smoking history. She quit smokeless tobacco use about 6 years ago. She reports that she does not drink alcohol or use illicit drugs.      Family History   Her family history includes Diabetes in her maternal grandmother and mother; High cholesterol in her mother; Osteoarthritis in her maternal grandmother; and Stroke in her maternal grandfather.      Vital Signs   BP 98/61  Pulse 74  Temp(Src) 35.8 C (96.4 F) (Temporal)  Ht 1.651 m (5\' 5" )      Physical Exam   General appearance: alert, well appearing, and in no distress, oriented to person, place, and time, acyanotic, in no respiratory distress and well hydrated.   Heart: normal rate and regular rhythm.    Lungs: unlabored respirations, no intercostal retractions or accessory muscle use.   Extremities: extremities normal, atraumatic, no cyanosis or edema. pulses not examined.   Neurologic: sensation not intact in feet       Wound #1   Location: right buttocks   Pre-debridement size: 1.8 cm (length), 5.0 cm (width), 0 cm (depth)   Post-debridement size: 1.8 cm (length), 3.0 cm (width), 0.3 cm (depth)   Tunneling: no   Undermining: yes   Drainage: small.   Odor: yes   Obvious infection: no   Edema: none   Tissue composition: necrotic tissue in center of wound otherwise clean.   Surrounding skin: healthy   Wound debrided today: yes.     Wound #2   Location:left buttocks   Size: 3.3 cm (length), 6.1 cm (width), 0.2 cm  (depth)   Tunnneling: no   Undermining: no   Drainage: small.   Odor: yes   Obvious infection: no   Edema: none   Tissue composition: healthy   Surrounding skin: healthy   Wound debrided today: no.     Wound #3   Location: Left ischium    size: 0.5 cm (length), 1.0 cm (width), 0.0 cm (depth)   Tunneling: no   Undermining: no   Drainage: small.   Odor: yes   Obvious infection: no   Edema: none   Tissue composition: healthy   Surrounding skin: healthy   Wound debrided today: no.    Procedure Note:    Patient was  sterile prepped and draped as appropriate.    Topical/Local anesthetic:yes    Debridement level:Wound # 1was debrided of devitalized tissue to muscle with a a surgical curette. Total area debrided to muscle: 1 cm sq.  Good hemostasis obtained. Patient tolerated procedure well.        Pertinent Studies  Prior records reviewed.      Assessment   Ms. Wittke is a 26 y.o. female with several pressure ulcers progressed from skin breakdown to Stage 3 in the Right buttock from moisture caused by a leaking Foley catheter.        Plan   Wound debrided today: yes. See procedure note.     Local wound care: 1/4 " gauze packing to Stage 3 pressure ulcer. Skin barrier cream to protect from further breakdown and prevent progression of Stage 2 ulcers.       Patient absolutely needs urgent urethral repair to prevent Foley leakage as she is at an extremely high risk for development of Stage 4 pressure ulcers which can become infected and lead ultimately to sepsis and even fatality in this difficult patient population.     Follow up in 1 week.

## 2012-03-28 ENCOUNTER — Telehealth: Payer: Self-pay | Admitting: Bariatric Surgery

## 2012-03-28 ENCOUNTER — Ambulatory Visit: Payer: Self-pay | Admitting: Urology

## 2012-03-28 ENCOUNTER — Ambulatory Visit: Payer: Self-pay | Admitting: Physical Medicine and Rehabilitation

## 2012-03-28 VITALS — BP 111/67 | HR 78 | Temp 97.5°F

## 2012-03-28 DIAGNOSIS — N319 Neuromuscular dysfunction of bladder, unspecified: Secondary | ICD-10-CM

## 2012-03-28 NOTE — Progress Notes (Signed)
Dear Dr. Suella Broad,     We saw your patient Cindy Ochoa, Cindy Ochoa 26 y.o. female in the urology clinic today for urodynamic testing.    HPI patient has history of neurogenic bladder dysfunction since her C5-6 spinal cord injury.  This is been managed with an indwelling Foley catheter.  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  For approximately one year, she was able to maintain continence.  For the last one plus year, she is leaking through the Foley as well as pushing the Foley out with the balloon intact through the urethra.  This happens approximately every 2-3 days.  She is having her home care aides replaced the Foley as needed.  This incontinence in addition to her immobility has caused at least 4 areas of skin breakdown on the buttocks and ischial tuberosities.    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  Current Outpatient Prescriptions on File Prior to Visit   Medication Sig Dispense Refill   . wound dressing (AQUACEL HYDROFIBER) pad Use 1 times a day as instructed.  1 each  2   . Hydroactive Dressings (TEGADERM HYDROCOLLOID) MISC Apply 1 Film topically daily  1 each  2   . oxybutynin (DITROPAN) 5 MG tablet Take 1 tablet (5 mg total) by mouth 3 times daily  90 tablet  11   . Non-System Medication Allevyn Hydrocellular adhesive dressing  3" x 3" Dx 707.03 Decub ulcer of Sacral region. Disp 3 box of 10  Pt changing dressing daily  10 each  0   . bisacodyl (DULCOLAX) 10 MG suppository Place 1 suppository (10 mg total) rectally daily as needed for Constipation  12 suppository  5   . guaiFENesin (MUCINEX) 600 MG 12 hr tablet Take 1 tablet (600 mg total) by  mouth 2 times daily   Swallow whole. Do not crush, break, or chew.  30 tablet  1   . Non-System Medication Mattress, [employer]  Use as directed.  Dx: 344.04, 596.54, 707.03, 707.05, 788.33  1 each  0   . Non-System Medication Hospital Bed, Semi-Electric (Head and Foot Adjustments) w/ any type side rail  Dx: 344.04, 596.54, 707.03, 707.05, 788.33  1 each  0   . Non-System Medication French Foley Catheters - Silicone  Diagnosis 596.54, 344.04  Fax to [facility]  10 each  6   . fluticasone-salmeterol (ADVAIR DISKUS) 250-50 MCG/DOSE diskus inhaler Inhale 1 puff into the lungs 2 times daily    60 each  0   . Skin Protectants, Misc. OINT Apply 1 Tube topically 2 times daily    1 Tube  11   . sertraline (ZOLOFT) 50 MG tablet Take 1 tablet (50 mg total) by mouth daily    30 tablet  3   . incontinence supply disposable Large pull ups - use up to 5 x daily  Diagnosis:  Quadriparesis 344.04 Fax and mail to [name]  150 each  5   . Non-System Medication One Hoyer Lift  Diagnosis:  Quadriplegic ICD-9 344.04  1 each  0   . disposable gloves 1 box Disposable Medium size gloves  100 each  6     Cindy Ochoa is allergic to heparin; nitrofurantoin; and vancomycin.    Review of Systems  Objective:  BP 111/67  Pulse 78  Temp 36.4 C (97.5 F)  No results found for this or any previous visit (from the past 24 hour(s)).     Physical Exam      procedure: The Foley catheter was removed.  A 7 French urethral catheter was placed.  A vaginal abdominal catheter was placed.  The bladder was backfilled.  When the bladder volume was approximately 20 mL, urine began to leak slowly at the urethra.  The pump was stopped and we repeated the test.  Again with very low volume of about 10 mL.  She began to leak urine through the urethra.  There were no high bladder pressures during the testing.  The catheters were removed  A new 18 French catheter was placed.    Assessment:      Urinary incontinence at very low volumes.  No observable bladder  contractions during today's exam.  Worsening skin sores.        Plan:      We'll plan for cystoscopy with Dr. Lisabeth Pick in the near future to evaluate bladder capacity as well as urethral anatomy.  He discussed with her possible long-term solutions for the incontinence, including urinary diversion.  This would require a strong support system at home to manage an appliance.  At this time Cindy Ochoa has aide service and her mother available for care.  She's never without care for longer than 2 hours.   It is unclear if a suprapubic catheter would be of benefit if the urethra is competent.  Continue oxybutynin 5 mg 3 times a day.  She is given Cipro 500 mg twice a day x2 doses to prevent acquisition of infection from today's procedure.  Continue aggressive wound care.  Cindy Ochoa to followup with physical medicine and rehab team for management of spasticity.  She has an appointment with them in the next few weeks. She's quite concerned that they will begin baclofen; she's been on a several times without effect and refuses to go on again.

## 2012-03-28 NOTE — Telephone Encounter (Signed)
I talked to Cindy Ochoa and told her that I could not get her home care for her to do dressing changes on the pressure ulcer she has currently. [facility], [facility], and [facility] refuse to go to [facility] house for there own reasons. I told Cindy Ochoa that I would order her dressing supplies and we will see her this week and discuss a plan of care with her. She was fine with that.

## 2012-03-29 ENCOUNTER — Encounter: Payer: Self-pay | Admitting: Internal Medicine

## 2012-03-31 ENCOUNTER — Telehealth: Payer: Self-pay

## 2012-03-31 ENCOUNTER — Ambulatory Visit: Payer: Self-pay | Admitting: Plastic Surgery

## 2012-03-31 NOTE — Telephone Encounter (Signed)
I tried to call the patient to ensure she removed packing from the stage III PU on her R buttock. The patient did not answer. Genevie Cheshire, RN

## 2012-04-01 ENCOUNTER — Encounter: Payer: Self-pay | Admitting: Bariatric Surgery

## 2012-04-01 ENCOUNTER — Ambulatory Visit: Payer: Self-pay | Admitting: Bariatric Surgery

## 2012-04-01 VITALS — BP 94/68 | HR 86 | Temp 96.6°F | Resp 16 | Ht 65.0 in

## 2012-04-01 DIAGNOSIS — L89309 Pressure ulcer of unspecified buttock, unspecified stage: Secondary | ICD-10-CM

## 2012-04-01 NOTE — Progress Notes (Signed)
[facility]  New Patient History and Physical Note      Name: Cindy Ochoa, Cindy Ochoa  MRN: [id number]  DOB: 1986-09-15      Date of Encounter: 04/01/2012      Medical Providers    Referring: Andrey Campanile, MD   PCP: Andrey Campanile, MD       Chief Complaint      Chief Complaint   Patient presents with   . Wound Check     Right and Left buttock pressure ulcer          History of Present Ilness   Cindy Ochoa is a 26 y.o. female. She returns for a follow of visit for management of pressure  wound(s) of the bilateral buttocks.      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      Allergies   She is allergic to heparin; nitrofurantoin; and vancomycin.      Medications  Current Outpatient Prescriptions   Medication Sig Note   . wound dressing (AQUACEL HYDROFIBER) pad Use 1 times a day as instructed.    . Hydroactive Dressings (TEGADERM HYDROCOLLOID) MISC Apply 1 Film topically daily    . oxybutynin (DITROPAN) 5 MG tablet Take 1 tablet (5 mg total) by mouth 3 times daily    . Non-System Medication Allevyn Hydrocellular adhesive dressing   3" x 3" Dx 707.03 Decub ulcer of Sacral region. Disp 3 box of 10  Pt changing dressing daily 03/18/2012: Never got    . bisacodyl (DULCOLAX) 10 MG suppository Place 1 suppository (10 mg total) rectally daily as needed for Constipation    . guaiFENesin (MUCINEX) 600 MG 12 hr tablet Take 1 tablet (600 mg total) by mouth 2 times daily   Swallow whole. Do not crush, break, or chew.    . Non-System Medication Mattress, [employer]  Use as directed.  Dx: 344.04, 596.54, 707.03, 707.05, 788.33    . Non-System Medication Hospital Bed, Semi-Electric (Head and Foot Adjustments) w/ any type side rail  Dx: 344.04, 596.54, 707.03, 707.05, 788.33    . Non-System Medication French Foley Catheters - Silicone  Diagnosis 596.54, 344.04  Fax to [facility]    . fluticasone-salmeterol (ADVAIR DISKUS) 250-50 MCG/DOSE diskus inhaler Inhale 1 puff into the lungs 2 times daily      . Skin Protectants, Misc. OINT Apply 1 Tube topically 2 times daily      . sertraline (ZOLOFT) 50 MG tablet Take 1 tablet (50 mg total) by mouth daily      . incontinence supply disposable Large pull ups - use up to 5 x daily  Diagnosis:  Quadriparesis 344.04 Fax and mail to [employer]    . Non-System Medication One Hoyer Lift  Diagnosis:  Quadriplegic ICD-9 344.04    .  disposable gloves 1 box Disposable Medium size gloves          Review of Systems   Unchanged from prior visit.    Past Surgical History   She has past surgical history that includes Multiple injuries (04/29/2005 ); craniotomy (04/29/2005); Cervical spine surgery (04/29/2005); Cervical spine surgery (05/04/2005); Tracheostomy tube placement (05/15/05); Gastrostomy tube placement (05/15/05); IVC filter (May 2006 ); Urethral plication (12/19/2009); and Left Tibia fracture (06/01/07).      Social History   She reports that she has been smoking Cigarettes.  She has a 1.5 pack-year smoking history. She quit smokeless tobacco use about 6 years ago. She reports that she does not drink alcohol or use illicit  drugs.      Family History   Her family history includes Diabetes in her maternal grandmother and mother; High cholesterol in her mother; Osteoarthritis in her maternal grandmother; and Stroke in her maternal grandfather.      Vital Signs   BP 94/68  Pulse 86  Temp(Src) 35.9 C (96.6 F) (Temporal)  Resp 16  Ht 1.651 m (5\' 5" )      Physical Exam   General appearance: alert, well appearing, and in no distress, oriented to person, place, and time, acyanotic, in no respiratory distress and well hydrated.   Heart: normal rate and regular rhythm.    Lungs: unlabored respirations, no intercostal retractions or accessory muscle use.   Extremities: extremities normal, atraumatic, no cyanosis or edema. pulses not examined.   Neurologic: sensation not intact in feet       Wound #1   Location: right buttocks   Pre-debridement size: 1.6 cm (length), 3.8 cm (width), 2.5 cm (depth)   Post-debridement size: 1.6 cm (length), 3.8 cm (width), 2.5 cm (depth)   Tunneling: no   Undermining: yes   Drainage: small.   Odor: yes   Obvious infection: no   Edema: none   Tissue composition: necrotic tissue in center of wound otherwise clean.   Surrounding skin: healthy   Wound debrided today: yes.     Wound #2   Location:left buttocks   Size: 1.1 cm (length), 4.8 cm (width), 0.1 cm (depth)   Tunnneling: no   Undermining: no   Drainage: small.   Odor: yes   Obvious infection: no   Edema: none   Tissue composition: healthy   Surrounding skin: healthy   Wound debrided today: no.     Wound #3   Location: Left ischium   Healed.    Procedure Note:    Patient was sterile prepped and draped as appropriate.    Topical/Local anesthetic:yes    Debridement level:Wound # 1was debrided of devitalized tissue to muscle with a a surgical curette. Total area debrided to muscle: 1 cm sq.  Good hemostasis obtained. Patient tolerated procedure well.        Pertinent Studies  Prior records reviewed.      Assessment   Cindy Ochoa is a 26 y.o. female with several  pressure ulcers progressed making progress.     Plan   Wound debrided today: yes. See procedure note.     Local wound care: 1/4 "  Iodoform gauze packing to Stage 3 pressure ulcer for 1 week and then plain gauze packing.   Continue skin barrier cream to protect from further breakdown and prevent progression of Stage 2 ulcers.    Patient has Urology appointment scheduled.     Follow up in 2-3 weeks.

## 2012-04-07 ENCOUNTER — Ambulatory Visit: Payer: Self-pay | Admitting: Surgery

## 2012-04-11 ENCOUNTER — Other Ambulatory Visit: Payer: Self-pay | Admitting: Physical Medicine and Rehabilitation

## 2012-04-11 MED ORDER — CIPROFLOXACIN HCL 500 MG PO TABS *I*
500.0000 mg | ORAL_TABLET | Freq: Two times a day (BID) | ORAL | Status: DC
Start: 2012-04-11 — End: 2012-05-23

## 2012-04-13 ENCOUNTER — Telehealth: Payer: Self-pay | Admitting: Internal Medicine

## 2012-04-13 ENCOUNTER — Encounter: Payer: Self-pay | Admitting: Gastroenterology

## 2012-04-13 NOTE — Telephone Encounter (Signed)
Call [facility] , who is trying to assist this pt in getting access home access.  Currently the pt, who is confined to motorized wheelchair, cannot leave her home as there is no ramp access.  She is currently going to her medical  appointments by stretcher, but her insurance is no longer going to pay for stretcher transportation for this pt.  Patient will not be able to attend appointments until a ramp can be made, however, currently that is difficult as the ramp has to be > 44 feet long and strong enough to hold her wheelchair and the agencies cannot find a ramp that fills the requirements.  Patient is in the middle of a treatment plan with Wound Care clinic; this case manager is asking if her Wound Care  can be done at home as pt cannot come for appointments.      PLAN; Case manager to discuss with Wound Care.

## 2012-04-13 NOTE — Telephone Encounter (Signed)
SW left voicemail message for Medicaid Supervisor Iantha Fallen) to further discuss pt's transportation needs.

## 2012-04-13 NOTE — Telephone Encounter (Signed)
Cindy Ochoa/[facility]: Patient has open wounds, Grover Canavan is wondering if Dr. Daphine Deutscher can write a script to [facility] to have a nurse come out and clean wounds. Patient is confined to a wheelchair. Pls call Cindy Ochoa at [phone]

## 2012-04-15 ENCOUNTER — Encounter: Payer: Self-pay | Admitting: Urology

## 2012-04-15 ENCOUNTER — Ambulatory Visit: Payer: Self-pay | Admitting: Urology

## 2012-04-15 VITALS — BP 139/79 | HR 61 | Temp 96.3°F | Ht 65.0 in | Wt 189.0 lb

## 2012-04-15 DIAGNOSIS — N319 Neuromuscular dysfunction of bladder, unspecified: Secondary | ICD-10-CM

## 2012-04-15 NOTE — Letter (Signed)
PATIENT NAMETECKLA, PHENG   DATE OF SERVICE:  04/15/2012  MRN:  [id number]  Page 2  [address]  [place], [place] [id number]  [id number]     April 15, 2012    Andrey Campanile, MD   [address]   [place], [place] [address]    RE:   CHEYENNE, INGEMI   DOB:  [birth year]  Unit#:  [id number]    Dear Dr. Daphine Deutscher:     This is just a brief followup on your patient, Vadie Pearl, who as you know, is a C5 quadriplegic with a neurogenic bladder and problems with her catheter draining all the time.  To make a long story short, when she sits up she has almost no urine coming out of her catheter and just voiding around it, but when she lies supine she has a lot of urine coming out of the catheter but is still incontinent.  It has been difficult to do urodynamics in a standard fashion because the urine leak so readily.  Hence, today I cystoscoped her.  With her lying upon me in dorsal lithotomy and sterilely draped, after cleansing her with chlorhexidine, we passed a 15.5-French flexible cystoscope per urethra into her bladder.  To find the urethra it is recessed and extremely short.  It is probably no more than 1 cm to 1.5 cm long and no real sphincteric mechanism is identified.  The bladder had some crystals in it but no stones and the orifices were in relatively normal position and configuration.  Except for some signs of mild erythema and inflammation from her having a long-standing catheter, there were no tumors or other mucosal abnormalities.  We then distended up her bladder with me trying to compress the urethra (not so easy to do).  She was able to hold at least 200 cc without any signs of dysautonomia.  She, of course, has no discomfort with this but that is from her lack of sensation.  I then withdrew the scope and looked at the urethra and indeed it is quite capacious and more importantly very short.  Again, I would guess no more than 1 cm and possibly 1.5 cm.  At this stage, I do not  think that catheter management in the long run is going to be anything but problematic.  I do not think she will need to have an augmentation cystoplasty, but she may well be benefitted from ligation of the urethra and then placement of a suprapubic tube.  Since she cannot use her hands, I do not think there is any hope that she will be able to do self-catheterization even through an abdominal limb.  I will be discussing these issues with my colleagues, Drs. Tonette Lederer, who have much more experience with neurogenic bladder dysfunction and urethral ligation than I do.     Again, thank you very much for allowing [place] to be involved in the care of your patient.  We will, of course, keep you informed of all urological follow-up.     Sincerely,           Deitra Mayo, MD    EMM/MODL  DD:  04/15/2012 12:00:14  DT:  04/15/2012 13:07:52  Job #:  [id number]    cc: Andrey Campanile, MD   [address]   [address]   [place], [place] [address]     Willeen Niece, MD   [address]   [place], [place]  [address]     Darrick Penna, MD   [address]   [place], [place] [address]     Kennith Gain, NP   [address]   [address]   [place], [place] [address]

## 2012-04-15 NOTE — Progress Notes (Signed)
This office note has been dictated.

## 2012-04-19 ENCOUNTER — Telehealth: Payer: Self-pay

## 2012-04-19 ENCOUNTER — Ambulatory Visit: Payer: Self-pay | Admitting: Physical Medicine and Rehabilitation

## 2012-04-19 NOTE — Telephone Encounter (Signed)
I left a message on voicemail with the name of the cream, Calmoseptine, and WC telephone number if [name] has any further questions. Genevie Cheshire, RN

## 2012-04-21 ENCOUNTER — Ambulatory Visit: Payer: Self-pay | Admitting: Internal Medicine

## 2012-04-22 ENCOUNTER — Ambulatory Visit: Payer: Self-pay | Admitting: Bariatric Surgery

## 2012-04-27 ENCOUNTER — Other Ambulatory Visit: Payer: Self-pay | Admitting: Internal Medicine

## 2012-04-27 NOTE — Telephone Encounter (Signed)
Transportation form completed.

## 2012-04-27 NOTE — Telephone Encounter (Signed)
Patient wishes a suppository refill and transportation arranged for her to get to her appointments.  Please call patient at [phone] as soon as possible.

## 2012-04-28 ENCOUNTER — Telehealth: Payer: Self-pay

## 2012-04-28 MED ORDER — BISACODYL 10 MG RE SUPP *I*
10.0000 mg | Freq: Every day | RECTAL | Status: DC | PRN
Start: 2012-04-27 — End: 2012-12-05

## 2012-04-28 NOTE — Telephone Encounter (Signed)
I called [name] and spoke w/ her twice re: supplies. It was my understanding that we could not order her supplies yet b/c pt was due to visit next week (at which point orders may change and different supplies would be necessary). In speaking w/ a colleague, Henreitta Leber, that supply orders were sent for the pt. I called [facility] back but there was no answer. I left her a message that supplies had been ordered. Genevie Cheshire, RN

## 2012-05-02 ENCOUNTER — Ambulatory Visit: Payer: Self-pay | Admitting: Physical Medicine and Rehabilitation

## 2012-05-03 ENCOUNTER — Telehealth: Payer: Self-pay | Admitting: Internal Medicine

## 2012-05-03 NOTE — Telephone Encounter (Signed)
SEE OTHER ENCOUNTER

## 2012-05-03 NOTE — Telephone Encounter (Signed)
Cindy Ochoa/[facility]: Patient has open wounds, Cindy Ochoa is wondering if Dr. Daphine Deutscher can write a script to [facility] to have a nurse come out and clean wounds. Patient is confined to a wheelchair and she has no way to any of her many appointments. Please follow up with Cindy Ochoa at [phone]    See encounter on 04/13/12

## 2012-05-03 NOTE — Telephone Encounter (Signed)
Message left for the caller

## 2012-05-04 ENCOUNTER — Encounter: Payer: Self-pay | Admitting: Internal Medicine

## 2012-05-05 NOTE — Telephone Encounter (Signed)
I have called [facility] and [facility]; this patient has been closed to both of these agencies and the case cannot be opened due to lack of safety in the home.

## 2012-05-05 NOTE — Telephone Encounter (Addendum)
Long conversation with [facility].  They are not able to open this pt's home care at this time.

## 2012-05-06 ENCOUNTER — Encounter: Payer: Self-pay | Admitting: Bariatric Surgery

## 2012-05-06 ENCOUNTER — Ambulatory Visit: Payer: Self-pay | Admitting: Bariatric Surgery

## 2012-05-06 VITALS — BP 120/79 | HR 120 | Temp 97.6°F | Resp 18 | Ht 65.0 in | Wt 189.0 lb

## 2012-05-06 DIAGNOSIS — L8993 Pressure ulcer of unspecified site, stage 3: Secondary | ICD-10-CM

## 2012-05-06 NOTE — Progress Notes (Signed)
[facility]  Progress Note      Name: Cindy Ochoa, Cindy Ochoa  MRN: [id number]  DOB: 04/27/1986      Date of Encounter: 05/06/2012      Medical Providers    Referring: Andrey Campanile, MD   PCP: Andrey Campanile, MD       Chief Complaint      Chief Complaint   Patient presents with   . Wound Check          History of Present Ilness   Cindy Ochoa is a 26 y.o. female. She returns for a follow of visit for management of pressure  wound(s) of the bilateral buttocks.      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      Allergies   She is allergic to heparin; nitrofurantoin; and vancomycin.      Medications  Current Outpatient Prescriptions   Medication Sig Note   . bisacodyl (DULCOLAX) 10 MG suppository Place 1 suppository (10 mg total) rectally daily as needed for Constipation    . ciprofloxacin (CIPRO) 500 MG tablet Take 1 tablet (500 mg total) by mouth 2 times daily Begin one day before procedure.    . Hydroactive Dressings (TEGADERM HYDROCOLLOID) MISC Apply 1 Film topically daily    . oxybutynin (DITROPAN) 5 MG tablet Take 1 tablet  (5 mg total) by mouth 3 times daily    . Non-System Medication Allevyn Hydrocellular adhesive dressing  3" x 3" Dx 707.03 Decub ulcer of Sacral region. Disp 3 box of 10  Pt changing dressing daily 03/18/2012: Never got    . guaiFENesin (MUCINEX) 600 MG 12 hr tablet Take 1 tablet (600 mg total) by mouth 2 times daily   Swallow whole. Do not crush, break, or chew.    . Non-System Medication Mattress, [employer]  Use as directed.  Dx: 344.04, 596.54, 707.03, 707.05, 788.33    . Non-System Medication Hospital Bed, Semi-Electric (Head and Foot Adjustments) w/ any type side rail  Dx: 344.04, 596.54, 707.03, 707.05, 788.33    . Non-System Medication French Foley Catheters - Silicone  Diagnosis 596.54, 344.04  Fax to [facility]    . fluticasone-salmeterol (ADVAIR DISKUS) 250-50 MCG/DOSE diskus inhaler Inhale 1 puff into the lungs 2 times daily      . Skin Protectants, Misc. OINT Apply 1 Tube topically 2 times daily      . sertraline (ZOLOFT) 50 MG tablet Take 1 tablet (50 mg total) by mouth daily      . incontinence supply disposable Large pull ups - use up to 5 x daily  Diagnosis:  Quadriparesis 344.04 Fax and mail to  [employer]    . Non-System Medication One Hoyer Lift  Diagnosis:  Quadriplegic ICD-9 344.04    . disposable gloves  1 box Disposable Medium size gloves      No current facility-administered medications for this visit.         Review of Systems   Unchanged from prior visit.    Past Surgical History   She has past surgical history that includes Multiple injuries (04/29/2005 ); craniotomy (04/29/2005); Cervical spine surgery (04/29/2005); Cervical spine surgery (05/04/2005); Tracheostomy tube placement (05/15/05); Gastrostomy tube placement (05/15/05); IVC filter (May 2006 ); Urethral plication (12/19/2009); and Left Tibia fracture (06/01/07).      Social History   She reports that she has been smoking Cigarettes.  She has a 1.5 pack-year smoking history. She quit smokeless tobacco use about 7 years ago. She reports  that she does not drink alcohol or use illicit drugs.      Family History   Her family history includes Diabetes in her maternal grandmother and mother; High cholesterol in her mother; Osteoarthritis in her maternal grandmother; and Stroke in her maternal grandfather.      Vital Signs  Filed Vitals:    05/06/12 0906   BP: 120/79   Pulse: 120   Temp: 36.4 C (97.6 F)   Resp: 18   Height: 1.651 m (5\' 5" )   Weight: 85.73 kg (189 lb)         Physical Exam   General appearance: alert, well appearing, and in no distress, oriented to person, place, and time, acyanotic, in no respiratory distress and well hydrated.   Heart: normal rate and regular rhythm.    Lungs: unlabored respirations, no intercostal retractions or accessory muscle use.   Extremities: extremities normal, atraumatic, no cyanosis or edema. pulses not examined.   Neurologic: sensation not intact in feet       Wound #1   Location: right buttocks   Well healed     Wound #2   Location:left buttocks   Size:0.6 cm (length), 0.6 cm (width), 0.1 cm (depth) 0.2cm  post-debridement   Tunnneling: no   Undermining: no   Drainage: small.   Odor: yes   Obvious infection: no   Edema: none   Tissue composition: healthy   Surrounding skin: healthy   Wound debrided today: yes     Wound #3   Location: Left ischium   Size:0.5 cm (length), 4.5 cm (width), 0.1 cm (depth) 0.2 cm  post-debridement- Stage 2   Tunnneling: no   Undermining: no   Drainage: small.   Odor: yes   Obvious infection: no   Edema: none   Tissue composition: healthy   Surrounding skin: healthy   Wound debrided today: yes     Wound #4   Location: Right ischium   Size:1.1 cm (length), 4.5 cm (width), 0.1 cm (depth) 0.2 cm  post-debridement - Stage 2   Tunnneling: no   Undermining: no   Drainage: small.   Odor: yes   Obvious infection: no   Edema: none   Tissue composition: healthy   Surrounding skin: healthy   Wound debrided today: yes    Procedure Note:    Patient was sterile prepped and draped as  appropriate.    Topical/Local anesthetic:yes    Debridement level:Wound # 2-4 were debrided of devitalized tissue to subcutaneous tissue with a a surgical curette. Total area debrided: approx. 10 cm sq cm sq.  Good hemostasis obtained. Patient tolerated procedure well.        Pertinent Studies  Prior records reviewed.      Assessment   Ms. Nikolic is a 26 y.o.  female with several Stage 2 pressure ulcers improving with better skin care and offloading.     Plan   Wound debrided today: yes. See procedure note.     Local wound care:   Continue skin barrier cream to protect from further breakdown and prevent progression of Stage 2 ulcers.    Patient being followed by Urology for possible suprapubic tube vs. surgery.     Follow up in 2-3 weeks.

## 2012-05-09 ENCOUNTER — Telehealth: Payer: Self-pay

## 2012-05-09 NOTE — Telephone Encounter (Signed)
Noted that Corliss Blacker will not be sending supplies to [facility] until 05/28/2012.

## 2012-05-13 ENCOUNTER — Other Ambulatory Visit: Payer: Self-pay | Admitting: Internal Medicine

## 2012-05-13 MED ORDER — INCONTINENCE SUPPLY DISPOSABLE MISC *A*
Status: DC
Start: 2012-05-13 — End: 2013-01-30

## 2012-05-16 ENCOUNTER — Ambulatory Visit: Payer: Self-pay | Admitting: Physical Medicine and Rehabilitation

## 2012-05-16 ENCOUNTER — Encounter: Payer: Self-pay | Admitting: Physical Medicine and Rehabilitation

## 2012-05-16 ENCOUNTER — Other Ambulatory Visit: Payer: Self-pay | Admitting: Physical Medicine and Rehabilitation

## 2012-05-16 ENCOUNTER — Telehealth: Payer: Self-pay

## 2012-05-16 VITALS — BP 140/64 | HR 140 | Temp 97.7°F | Resp 28

## 2012-05-16 DIAGNOSIS — N390 Urinary tract infection, site not specified: Secondary | ICD-10-CM

## 2012-05-16 LAB — URINALYSIS WITH MICROSCOPIC
Ketones, UA: NEGATIVE
Nitrite,UA: NEGATIVE
Protein,UA: 30 mg/dL — AB
RBC,UA: 2 /hpf (ref 0–2)
Specific Gravity,UA: 1.015 (ref 1.002–1.030)
WBC,UA: 9 /hpf — AB (ref 0–5)
pH,UA: 6 (ref 5.0–8.0)

## 2012-05-16 MED ORDER — SULFAMETHOXAZOLE-TRIMETHOPRIM 800-160 MG PO TABS *I*
1.0000 | ORAL_TABLET | Freq: Two times a day (BID) | ORAL | Status: DC
Start: 2012-05-16 — End: 2012-05-23

## 2012-05-16 NOTE — Progress Notes (Signed)
Patient called asking about my recommendation for the emergency room visit.  The ambulance arrived to her home, and her heart rate was 107 and her blood pressure was lower than it had been in the clinic today (she cannot recall the number).  The ambulance crew told [facility] that  [facility] was "code red", and were not taking nonemergent patients.  She would need to go to [place].  Rolando is requesting not to go to the ED.  Although I continue to recommend that she have IV antibiotics, her heart rate is down, and her UA is negative for nitrites.  Based on her last 2 urine culture reports(Proteus and E. Coli) will start Bactrim. Urine culture is pending and I will follow the culture data.  I've advised that should she feel unwell, developed chills, or fever that she should go to the ED.  Instructed her to teach her caregivers to look for signs of mental status changes as a symptom of UTI, and if these occur they should not hesitate to have her go to the ED.  Taline is in agreement with this plan.

## 2012-05-16 NOTE — Telephone Encounter (Signed)
Cindy Ochoa would like to speak to you about why you want her to go to the ED Department. She does not want to go because there is a wait

## 2012-05-16 NOTE — Progress Notes (Signed)
Patient ID: Cindy Ochoa is a 26 y.o. female urology followup clinic today to discuss plans for neurogenic bladder managed by Foley, with expulsion of Foley, urinary incontinence and worsening sacral wound.     HPI patient has long-standing history of neurogenic bladder dysfunction.  After several years with a Foley catheter, she developed urethral incompetence and the Foley would not stay in.  She was treated surgically by Dr. Cheyenne Adas was urethral plication.  This was effective for approximately one years time, that she began having incontinence as well as the Foley catheter balloon being expelled from the urethra approximately every 3-4 days.  She has been frequently wet, and has a sacral skin sore that is worsening.  She reports that for approximately one week's time she has had significant "whole body spasms".  She feels she is a UTI.  There is been no fevers, and now she feels like she is "getting sick".  Her catheter was last changed yesterday.  She recently underwent cystoscopy with Dr. 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  Her bladder capacity was approximately 200 mL. He is discussing surgical options with his urology colleagues to best manage her bladder.      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  Current Outpatient Prescriptions on File Prior to Visit   Medication Sig Dispense Refill   . 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 bisacodyl (DULCOLAX) 10 MG suppository Place 1 suppository (10 mg total) rectally daily as needed for Constipation  12  suppository  5   . ciprofloxacin (CIPRO) 500 MG tablet Take 1 tablet (500 mg total) by mouth 2 times daily Begin one day before procedure.  4 tablet  0   . Hydroactive Dressings (TEGADERM HYDROCOLLOID) MISC Apply 1 Film topically daily  1 each  2   . oxybutynin (DITROPAN) 5 MG tablet Take 1 tablet (5 mg total) by mouth 3 times daily  90 tablet  11   . Non-System Medication Allevyn Hydrocellular adhesive dressing  3" x 3" Dx 707.03 Decub ulcer of Sacral region. Disp 3 box of 10  Pt changing dressing daily  10 each  0   . guaiFENesin (MUCINEX) 600 MG 12 hr tablet Take 1 tablet (600 mg total) by mouth 2 times daily   Swallow whole. Do not crush, break, or chew.  30 tablet  1   . Non-System Medication Mattress, [employer]  Use as directed.  Dx: 344.04, 596.54, 707.03, 707.05, 788.33  1 each  0   . Non-System Medication Hospital Bed, Semi-Electric (Head and Foot Adjustments) w/ any type side rail  Dx: 344.04, 596.54, 707.03, 707.05, 788.33  1 each  0   . Non-System Medication French Foley Catheters - Silicone  Diagnosis 596.54, 344.04  Fax to [facility]  10 each  6   . fluticasone-salmeterol (ADVAIR DISKUS) 250-50 MCG/DOSE diskus inhaler Inhale 1 puff into the lungs 2 times daily    60 each  0   . Skin Protectants, Misc. OINT Apply 1 Tube topically 2 times daily    1 Tube  11   . sertraline (ZOLOFT) 50 MG tablet Take 1 tablet (  50 mg total) by mouth daily    30 tablet  3   . Non-System Medication One Hoyer Lift  Diagnosis:  Quadriplegic ICD-9 344.04  1 each  0   . disposable gloves 1 box Disposable Medium size gloves  100 each  6     No current facility-administered medications on file prior to visit.     Leayah is allergic to heparin; nitrofurantoin; and vancomycin.    Review of Systems   Constitutional: Positive for chills and diaphoresis. Negative for fever, activity change and fatigue.   Genitourinary:        See HPI   Skin:        Skin sore unchanged   Neurological:        Unchanged neurologic problems of  decreased sensation and impaired mobility   All other systems reviewed and are negative.              Objective:  BP 140/64  Pulse 140  Temp(Src) 36.5 C (97.7 F)  Resp 28     Physical Exam   Constitutional: She appears well-developed and well-nourished. No distress.   Obese young female lying on a stretcher.  Her entire body is shaking.   Cardiovascular:   Tachycardia   Pulmonary/Chest: Effort normal and breath sounds normal.   Abdominal: Soft. She exhibits distension. There is no tenderness.   Skin: She is not diaphoretic.             Assessment:       neurogenic bladder dysfunction.  Likely UTI with urosepsis, and meeting criteria for SIRS, tachycardia, tachypnea, chills, and increased blood pressure from her baseline, that might represent autonomic hyperreflexia.           Plan:       I discussed with Landon that she should have labs drawn, urine culture sent, and receive IV antibiotics now.  She is agreeable, however she needs to make immediate arrangements for her son's safety and needs to meet him as he gets off the bus today.  I sent the urine for UA and culture and sensitivity.  She'll return to the ED shortly.  Report was called to the triage nurse.    We'll followup regarding bladder management options with Dr. Lisabeth Pick after her UTIs successfully treated.

## 2012-05-17 ENCOUNTER — Inpatient Hospital Stay
Admission: EM | Admit: 2012-05-17 | Disposition: A | Discharge status: Home Health | Payer: Self-pay | Source: Ambulatory Visit | Attending: Internal Medicine | Admitting: Internal Medicine

## 2012-05-17 ENCOUNTER — Encounter: Payer: Self-pay | Admitting: Emergency Medicine

## 2012-05-17 DIAGNOSIS — N390 Urinary tract infection, site not specified: Secondary | ICD-10-CM | POA: Diagnosis present

## 2012-05-17 LAB — URINALYSIS WITH MICROSCOPIC
Leuk Esterase,UA: NEGATIVE
Nitrite,UA: NEGATIVE
Protein,UA: 30 mg/dL — AB
RBC,UA: 12 /hpf — ABNORMAL HIGH (ref 0–2)
Specific Gravity,UA: 1.031 — ABNORMAL HIGH (ref 1.002–1.030)
WBC,UA: 3 /hpf (ref 0–5)
pH,UA: 5 (ref 5.0–8.0)

## 2012-05-17 LAB — RUQ PANEL (ED ONLY)
ALT: 16 U/L (ref 0–35)
AST: 20 U/L (ref 0–35)
Albumin: 4.4 g/dL (ref 3.5–5.2)
Alk Phos: 57 U/L (ref 35–105)
Amylase: 50 U/L (ref 28–100)
Bilirubin,Direct: <0.2 mg/dL (ref 0.0–0.3)
Bilirubin,Total: 0.3 mg/dL (ref 0.0–1.2)
Lipase: 32 U/L (ref 13–60)
Total Protein: 7.7 g/dL (ref 6.3–7.7)

## 2012-05-17 LAB — CBC AND DIFFERENTIAL
Baso # K/uL: 0 10*3/uL (ref 0.0–0.1)
Basophil %: 0.1 % (ref 0.1–1.2)
Eos # K/uL: 0 10*3/uL (ref 0.0–0.4)
Eosinophil %: 0.1 % — ABNORMAL LOW (ref 0.7–5.8)
Hematocrit: 41 % (ref 34–45)
Hemoglobin: 13.5 g/dL (ref 11.2–15.7)
Lymph # K/uL: 0.7 10*3/uL — ABNORMAL LOW (ref 1.2–3.7)
Lymphocyte %: 9 % — ABNORMAL LOW (ref 19.3–51.7)
MCV: 92 fL (ref 79–95)
Mono # K/uL: 0.4 10*3/uL (ref 0.2–0.9)
Monocyte %: 5.4 % (ref 4.7–12.5)
Neut # K/uL: 6.9 10*3/uL — ABNORMAL HIGH (ref 1.6–6.1)
Platelets: 100 10*3/uL — ABNORMAL LOW (ref 160–370)
RBC: 4.4 MIL/uL (ref 3.9–5.2)
RDW: 15.1 % — ABNORMAL HIGH (ref 11.7–14.4)
Seg Neut %: 85.4 % — ABNORMAL HIGH (ref 34.0–71.1)
WBC: 8.1 10*3/uL (ref 4.0–10.0)

## 2012-05-17 LAB — LACTATE, PLASMA: Lactate: 0.7 mmol/L (ref 0.5–2.2)

## 2012-05-17 LAB — PLASMA PROF 7 (ED ONLY)
Anion Gap,PL: 15 (ref 7–16)
CO2,Plasma: 21 mmol/L (ref 20–28)
Chloride,Plasma: 104 mmol/L (ref 96–108)
Creatinine: 0.39 mg/dL — ABNORMAL LOW (ref 0.51–0.95)
GFR,Black: 167 *
GFR,Caucasian: 145 *
Glucose,Plasma: 83 mg/dL (ref 60–99)
Potassium,Plasma: 3.5 mmol/L (ref 3.4–4.7)
Sodium,Plasma: 140 mmol/L (ref 132–146)
UN,Plasma: 10 mg/dL (ref 6–20)

## 2012-05-17 LAB — HM HIV SCREENING OFFERED

## 2012-05-17 MED ORDER — FONDAPARINUX SODIUM 2.5 MG/0.5ML SC SOLN *I*
2.5000 mg | Freq: Every day | SUBCUTANEOUS | Status: DC
Start: 2012-05-18 — End: 2012-05-23
  Administered 2012-05-19 – 2012-05-23 (×5): 2.5 mg via SUBCUTANEOUS
  Filled 2012-05-17 (×7): qty 0.5

## 2012-05-17 MED ORDER — SODIUM CHLORIDE 0.9 % IV BOLUS *I*
1000.0000 mL | Freq: Once | Status: DC
Start: 2012-05-17 — End: 2012-05-17

## 2012-05-17 MED ORDER — CEFTRIAXONE SODIUM 1G IN DEXTROSE 50 ML *I*
1.0000 g | INTRAVENOUS | Status: DC
Start: 2012-05-17 — End: 2012-05-18
  Administered 2012-05-17: 1 g via INTRAVENOUS
  Filled 2012-05-17 (×2): qty 50

## 2012-05-17 MED ORDER — ONDANSETRON HCL 2 MG/ML IV SOLN *I*
4.0000 mg | Freq: Four times a day (QID) | INTRAMUSCULAR | Status: DC | PRN
Start: 2012-05-17 — End: 2012-05-23
  Administered 2012-05-19 – 2012-05-20 (×3): 4 mg via INTRAVENOUS
  Filled 2012-05-17 (×3): qty 2

## 2012-05-17 MED ORDER — ACETAMINOPHEN 325 MG PO TABS *I*
650.0000 mg | ORAL_TABLET | Freq: Four times a day (QID) | ORAL | Status: DC | PRN
Start: 2012-05-17 — End: 2012-05-23
  Administered 2012-05-18 (×2): 650 mg via ORAL
  Filled 2012-05-17 (×2): qty 2

## 2012-05-17 MED ORDER — SODIUM CHLORIDE 0.9 % IV SOLN WRAPPED *I*
150.0000 mL/h | Status: DC
Start: 2012-05-17 — End: 2012-05-19
  Administered 2012-05-17 – 2012-05-18 (×10): 100 mL/h via INTRAVENOUS
  Administered 2012-05-19: 200 mL/h via INTRAVENOUS
  Administered 2012-05-19 (×2): 150 mL/h via INTRAVENOUS

## 2012-05-17 MED ORDER — SODIUM CHLORIDE 0.9 % IV BOLUS *I*
1000.0000 mL | Freq: Once | Status: AC
Start: 2012-05-17 — End: 2012-05-17
  Administered 2012-05-17: 1000 mL via INTRAVENOUS

## 2012-05-17 MED ORDER — PIPERACILLIN-TAZOBACTAM IN D5W 4.5 GM/100ML IV SOLN *I*
4.5000 g | Freq: Once | INTRAVENOUS | Status: DC
Start: 2012-05-17 — End: 2012-05-17

## 2012-05-17 MED ORDER — ALBUTEROL SULFATE (2.5 MG/3ML) 0.083% IN NEBU *I*
2.5000 mg | INHALATION_SOLUTION | Freq: Four times a day (QID) | RESPIRATORY_TRACT | Status: DC
Start: 2012-05-17 — End: 2012-05-23
  Administered 2012-05-17 – 2012-05-22 (×16): 2.5 mg via RESPIRATORY_TRACT
  Filled 2012-05-17 (×21): qty 3

## 2012-05-17 MED ORDER — ONDANSETRON HCL 2 MG/ML IV SOLN *I*
4.0000 mg | Freq: Once | INTRAMUSCULAR | Status: DC
Start: 2012-05-17 — End: 2012-05-17

## 2012-05-17 MED ORDER — ACETAMINOPHEN 500 MG PO TABS *I*
500.0000 mg | ORAL_TABLET | Freq: Once | ORAL | Status: DC
Start: 2012-05-17 — End: 2012-05-17

## 2012-05-17 MED ORDER — CLINDAMYCIN PHOSPHATE IN D5W 600 MG/50ML IV SOLN *I*
600.0000 mg | Freq: Once | INTRAVENOUS | Status: DC
Start: 2012-05-17 — End: 2012-05-17

## 2012-05-17 MED ORDER — BISACODYL 10 MG RE SUPP *I*
10.0000 mg | RECTAL | Status: DC
Start: 2012-05-18 — End: 2012-05-23
  Administered 2012-05-21 – 2012-05-23 (×2): 10 mg via RECTAL

## 2012-05-17 NOTE — ED Notes (Signed)
Providers at bedside. Provider is ok with only 1 blood culture for now as pt is very difficult stick

## 2012-05-17 NOTE — Progress Notes (Signed)
Resident Note    Patient seen and examined with Dr. Casey Burkitt. I have read her note and agree with her findings, exam and plan.     In short, Cindy Ochoa is a 26 year old female with quadriplegia, neurogenic bladder (chronic foley which often needs replacement), recurrent UTIs, presenting with fevers/chills, nausea and emesis. She was seen in the urology clinic yesterday and had a UA and urine culture yesterday which is growing lactose fermenting GNB.     Plan  -gentle hydration overnight  -CTX 1gm q24hrs, broaden if she decompensates  -albuterol QID to help clear secretions  -follow cultures    Signed by Corinna Lines, MD, PGY3  05/17/12
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
## 2012-05-17 NOTE — ED Notes (Signed)
Low air loss mattress ordered for patient.  E. Yuval Rubens, CRN
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
## 2012-05-17 NOTE — ED Notes (Signed)
Pt resting in bed, soft touch call bell given to pt as she is fully contracted in BUE. Pt denies pain. Writer assisted pt with coughing, bringing up white/yellow sputum. Pt is febrile & tachycardic, all other VSS, will cont to monitor & tx as ordered.
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
## 2012-05-17 NOTE — ED Notes (Signed)
IV attempted x4 IV team paged. Stated they would be down but there is 8 pts in front of [place]. IV placed at 2000.

## 2012-05-17 NOTE — ED Notes (Signed)
1. Pt is paraplegic and has had uri x 3 days with increased congestion and difficulty coughing up secretions.

## 2012-05-18 ENCOUNTER — Other Ambulatory Visit: Payer: Self-pay | Admitting: Urology

## 2012-05-18 LAB — BASIC METABOLIC PANEL
Anion Gap: 13 (ref 7–16)
CO2: 19 mmol/L — ABNORMAL LOW (ref 20–28)
Calcium: 8.8 mg/dL (ref 8.8–10.2)
Chloride: 105 mmol/L (ref 96–108)
Creatinine: 0.45 mg/dL — ABNORMAL LOW (ref 0.51–0.95)
GFR,Black: 159 *
GFR,Caucasian: 138 *
Glucose: 97 mg/dL (ref 60–99)
Lab: 8 mg/dL (ref 6–20)
Potassium: 3.2 mmol/L — ABNORMAL LOW (ref 3.3–5.1)
Sodium: 137 mmol/L (ref 133–145)

## 2012-05-18 LAB — CBC
Hematocrit: 37 % (ref 34–45)
Hemoglobin: 11.9 g/dL (ref 11.2–15.7)
MCV: 94 fL (ref 79–95)
Platelets: 74 10*3/uL — ABNORMAL LOW (ref 160–370)
RBC: 4 MIL/uL (ref 3.9–5.2)
RDW: 15.5 % — ABNORMAL HIGH (ref 11.7–14.4)
WBC: 9.9 10*3/uL (ref 4.0–10.0)

## 2012-05-18 MED ORDER — OXYBUTYNIN CHLORIDE 5 MG PO TABS *I*
5.0000 mg | ORAL_TABLET | Freq: Three times a day (TID) | ORAL | Status: DC | PRN
Start: 2012-05-18 — End: 2012-05-23
  Administered 2012-05-18: 5 mg via ORAL
  Filled 2012-05-18 (×8): qty 1

## 2012-05-18 MED ORDER — PIPERACILLIN-TAZOBACTAM IN D5W 4.5 GM/100ML IV SOLN *I*
4.5000 g | Freq: Four times a day (QID) | INTRAVENOUS | Status: DC
Start: 2012-05-18 — End: 2012-05-19
  Administered 2012-05-19 (×2): 4.5 g via INTRAVENOUS
  Filled 2012-05-18 (×11): qty 100

## 2012-05-18 MED ORDER — PIPERACILLIN-TAZOBACTAM IN D5W 3.375 GM/50ML IV SOLN *I*
INTRAVENOUS | Status: AC
Start: 2012-05-18 — End: 2012-05-18
  Administered 2012-05-18: 0 g via INTRAVENOUS
  Filled 2012-05-18: qty 50

## 2012-05-18 MED ORDER — ALBUTEROL SULFATE (2.5 MG/3ML) 0.083% IN NEBU *I*
2.5000 mg | INHALATION_SOLUTION | Freq: Four times a day (QID) | RESPIRATORY_TRACT | Status: DC | PRN
Start: 2012-05-18 — End: 2012-05-20
  Administered 2012-05-19 (×2): 2.5 mg via RESPIRATORY_TRACT
  Filled 2012-05-18: qty 3

## 2012-05-18 MED ORDER — VANCOMYCIN HCL 5000 MG IV SOLR WRAPPED *I*
1500.0000 mg | Freq: Three times a day (TID) | INTRAVENOUS | Status: DC
Start: 2012-05-19 — End: 2012-05-19
  Filled 2012-05-18: qty 30

## 2012-05-18 MED ORDER — CEFTRIAXONE SODIUM 1G IN DEXTROSE 50 ML *I*
1.0000 g | INTRAVENOUS | Status: DC
Start: 2012-05-18 — End: 2012-05-18
  Administered 2012-05-18: 1 g via INTRAVENOUS
  Filled 2012-05-18: qty 50

## 2012-05-18 MED ORDER — PIPERACILLIN-TAZOBACTAM IN D5W 3.375 GM/50ML IV SOLN *I*
3.3750 g | Freq: Four times a day (QID) | INTRAVENOUS | Status: DC
Start: 2012-05-18 — End: 2012-05-18
  Administered 2012-05-18: 0 g via INTRAVENOUS

## 2012-05-18 MED ORDER — VANCOMYCIN HCL 5000 MG IV SOLR WRAPPED *I*
1.7500 g | Freq: Once | INTRAVENOUS | Status: AC
Start: 2012-05-18 — End: 2012-05-19
  Administered 2012-05-19: 1.75 g via INTRAVENOUS
  Filled 2012-05-18 (×2): qty 35

## 2012-05-18 MED ORDER — SODIUM CHLORIDE 0.9 % IV BOLUS *I*
500.0000 mL | Status: DC
Start: 2012-05-18 — End: 2012-05-19
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
## 2012-05-18 NOTE — ED Notes (Signed)
Pt with increased resp and diaphoresis when woken up. Lungs with audible congestion upper lobes. Pt with thick white/yellow productive cough. Chest PT to be performed. Neb tx given. O2 Sat 95% RA. Pt declines O2 via NC at this time. Pt in no acute distress and is alert and oriented. ST on monitor. Chest xray performed. IV Abx ordered. Pt difficult to obtain blood from. Charge RN aware. Pt denies any chest pain. Pt with temp 101. Given tylenol with effect. Temp now 35.5 C. Will continue to monitor respiratory status. Vest ordered for chest PT.

## 2012-05-18 NOTE — ED Notes (Signed)
Pt lying in bed , aox3 , tray at bedside pt states will eat a bit later. No other issues at this time , RN made aware, will continue to monitor

## 2012-05-18 NOTE — ED Notes (Signed)
Pt resting in bed, aox3,  New o2 lead placed on pt finger ,call bell in reach, RN made awaere, will continue to monitor
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
## 2012-05-18 NOTE — ED Notes (Signed)
Fruit plate ordered for pt.

## 2012-05-18 NOTE — ED Notes (Signed)
Pt resting at this time. Has been febrile on and off. Has tap button by her side to call for help. Pt needs assistance with coughing due to neurogenic diaphragm. Pt IV infusing. Pt is a difficult blood draw. Offers no complaints at present.

## 2012-05-18 NOTE — ED Notes (Signed)
Pt moved to low airloss mattress for comfort. Pt skin intact on buttocks. Pt resting comfortably. Will continue to monitor

## 2012-05-18 NOTE — ED Notes (Signed)
Foley in place  

## 2012-05-18 NOTE — Consults (Signed)
Pt well known to [facility] w/hx of multiple presure ulcers. Left ischial and sacrum are noted to be healed at present. Right medial and distal ischial w/2 stage 3 ulcers.  Mother at bedside is primary caregiver states that she has had so much leakage from foley due to bladder spasms that open areas continue to recur from urine.  Pt  underpads wet, new foley placed within past hour per mother.  No other sites noted.  Pt is on a low airloss replacement mattress that was set for pt height and weight and is working appropriately.      Recommendations:  To ulcer cleanse w/NS apply Aquacel Ag and cover w/ Allevyn gentle.change every 2-3 days and prn for increased drainage or if dressing becomes saturated with urine  Critic Aid Clear Moisture Barrier Ointment to bottom to repel moisture from urine leakage, do not apply to area where Allevyn gentle will be placed.    T&P every 2 hours  Elevate heels off mattress  Keep HOB to 30 degrees or less except for meals, to prevent shearing.    Pre albumin level weekly  Nutrition consultation         05/18/12 1000   Pressure Ulcer Stage III Ischium Right;Distal   Date First Assessed/Time First Assessed: 05/18/12 1056   Pre-existing: Yes  Staging: Stage III  Location: Ischium  Orientation: Right;Distal   This assessment was completed by [facility] Nurse   Dressing Status Other (Comment)   Dressing Changed Open to air   Drainage Amount UTA   Drainage Characteristics N/A   Length (cm) 1 cm   Width (cm) 2.1 cm   Depth (cm) 0.3 cm   Tunneling Observed 0   Undermining Observed 0   % of Wound Bed Tissue - Clean 20 %   % of Wound Bed Tissue - Red / Granulation 0 %   % of Wound Bed Tissue - Slough 80 %   % of Wound Bed Tissue - Eschar 0 %   % of Wound Bed Tissue - Fat Necrosis 0 %   % of non-blanchable red skin (stage I) 0 %   % of Wound Bed Tissue - Purple Intact Skin (for DTI) 0 %   % of Wound Bed Tissue - Other (comment) 0 %   Edges Open   Peri-wound skin appearance Intact   Cleansed with  Normal Saline   Odor after cleansing No   Primary Dressing Silver - Aquacel Ag   Secondary Dressing Foam - Allevyn Gentle 5x5   Pressure Ulcer Stage III Ischium Right;Medial   Date First Assessed/Time First Assessed: 05/18/12 1058   Pre-existing: Yes  Staging: Stage III  Location: Ischium  Orientation: Right;Medial   This assessment was completed by [facility] Nurse   Dressing Status Other (Comment)   Dressing Changed Open to air   Drainage Amount UTA   Drainage Characteristics N/A   Length (cm) 1.1 cm   Width (cm) 8 cm   Depth (cm) 0.1 cm   Tunneling Observed 0   Undermining Observed 0   % of Wound Bed Tissue - Clean 100 %   % of Wound Bed Tissue - Red / Granulation 0 %   % of Wound Bed Tissue - Slough 0 %   % of Wound Bed Tissue - Eschar 0 %   % of Wound Bed Tissue - Fat Necrosis 0 %   % of non-blanchable red skin (stage I) 0 %   % of Wound Bed Tissue - Purple  Intact Skin (for DTI) 0 %   % of Wound Bed Tissue - Other (comment) 0 %   Edges Open   Peri-wound skin appearance Intact   Cleansed with Normal Saline   Odor after cleansing No   Primary Dressing Silver - Aquacel Ag   Secondary Dressing Foam - Allevyn Gentle 5x5

## 2012-05-18 NOTE — ED Notes (Signed)
Right sided droop in face slight, right eye cloudy

## 2012-05-18 NOTE — Provider Consult (Signed)
Called by primary team for recommendations on managing leakage around indwelling foley catheter.    Briefly, pt is a 26 year old female with a history of quadriplegia and neurogenic bladder. She is a patient of Dr. Donnajean Lopes. Pt has had leaking noted around her foley catheter. She is currently admitted to the medicine service for treatment urosepsis.    Recommend:  - continue treatment of Urosepsis per primary team  - Ditropan 5 mg po q8 hours as needed for bladder spasms  - may decrease fluid in foley catheter balloon which may also decrease bladder spasms  - Follow-up with Dr. Lisabeth Pick as an outpatient after discharge.    D/w Dr. Sander Nephew who is in agreement with plan above.    Please page Urology consult pager at [phone] with any further questions or concerns.    Alba Destine, PA

## 2012-05-18 NOTE — ED Notes (Signed)
RT called to do chest physiotherapy on pt and to get vest for pt therapy. Will continue to monitor

## 2012-05-18 NOTE — ED Notes (Signed)
Assumed care of pt at this time.

## 2012-05-18 NOTE — Progress Notes (Signed)
Utilization Management    Level of Care Inpatient as of the date 05/17/2012      Quentin Mulling, RN     Pager: [phone]

## 2012-05-19 ENCOUNTER — Other Ambulatory Visit: Payer: Self-pay | Admitting: Urology

## 2012-05-19 ENCOUNTER — Encounter: Payer: Self-pay | Admitting: Internal Medicine

## 2012-05-19 LAB — CBC
Hematocrit: 37 % (ref 34–45)
Hemoglobin: 11.9 g/dL (ref 11.2–15.7)
MCV: 93 fL (ref 79–95)
Platelets: 75 10*3/uL — ABNORMAL LOW (ref 160–370)
RBC: 3.9 MIL/uL (ref 3.9–5.2)
RDW: 15 % — ABNORMAL HIGH (ref 11.7–14.4)
WBC: 8.6 10*3/uL (ref 4.0–10.0)

## 2012-05-19 LAB — BASIC METABOLIC PANEL
Anion Gap: 13 (ref 7–16)
CO2: 21 mmol/L (ref 20–28)
Calcium: 8.4 mg/dL — ABNORMAL LOW (ref 8.8–10.2)
Chloride: 107 mmol/L (ref 96–108)
Creatinine: 0.52 mg/dL (ref 0.51–0.95)
GFR,Black: 152 *
GFR,Caucasian: 132 *
Glucose: 95 mg/dL (ref 60–99)
Lab: 6 mg/dL (ref 6–20)
Potassium: 3.3 mmol/L (ref 3.3–5.1)
Sodium: 141 mmol/L (ref 133–145)

## 2012-05-19 LAB — PREALBUMIN: Prealbumin: 13 mg/dL — ABNORMAL LOW (ref 20–40)

## 2012-05-19 LAB — LEGIONELLA CULTURE

## 2012-05-19 LAB — GRAM STAIN

## 2012-05-19 LAB — AEROBIC CULTURE

## 2012-05-19 MED ORDER — DIPHENHYDRAMINE HCL 50 MG/ML IJ SOLN *I*
25.0000 mg | Freq: Once | INTRAMUSCULAR | Status: AC | PRN
Start: 2012-05-19 — End: 2012-05-19

## 2012-05-19 MED ORDER — LEVALBUTEROL HCL 0.63 MG/3ML IN NEBU *I*
0.6300 mg | INHALATION_SOLUTION | Freq: Once | RESPIRATORY_TRACT | Status: AC
Start: 2012-05-19 — End: 2012-05-19
  Administered 2012-05-19: 0.63 mg via RESPIRATORY_TRACT
  Filled 2012-05-19: qty 3

## 2012-05-19 MED ORDER — POTASSIUM CHLORIDE CR 10 MEQ PO TBCR *A*
40.0000 meq | ORAL_TABLET | Freq: Once | ORAL | Status: AC
Start: 2012-05-19 — End: 2012-05-19
  Administered 2012-05-19: 40 meq via ORAL
  Filled 2012-05-19: qty 4

## 2012-05-19 MED ORDER — VANCOMYCIN HCL 5000 MG IV SOLR WRAPPED *I*
1250.0000 mg | Freq: Two times a day (BID) | INTRAVENOUS | Status: DC
Start: 2012-05-19 — End: 2012-05-19
  Filled 2012-05-19 (×2): qty 25

## 2012-05-19 MED ORDER — CEFTRIAXONE SODIUM 1G IN DEXTROSE 50 ML *I*
1.0000 g | INTRAVENOUS | Status: DC
Start: 2012-05-19 — End: 2012-05-23
  Administered 2012-05-19 – 2012-05-22 (×4): 1 g via INTRAVENOUS
  Filled 2012-05-19 (×5): qty 50

## 2012-05-19 MED ORDER — VANCOMYCIN HCL 5000 MG IV SOLR WRAPPED *I*
1250.0000 mg | Freq: Three times a day (TID) | INTRAVENOUS | Status: DC
Start: 2012-05-19 — End: 2012-05-21
  Administered 2012-05-19 – 2012-05-21 (×7): 1250 mg via INTRAVENOUS
  Filled 2012-05-19 (×9): qty 25

## 2012-05-19 MED ORDER — BISACODYL 10 MG RE SUPP *I*
10.0000 mg | Freq: Once | RECTAL | Status: AC
Start: 2012-05-19 — End: 2012-05-19
  Administered 2012-05-19: 10 mg via RECTAL

## 2012-05-19 MED ORDER — ONDANSETRON HCL 2 MG/ML IV SOLN *I*
4.0000 mg | Freq: Once | INTRAMUSCULAR | Status: AC
Start: 2012-05-19 — End: 2012-05-20
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
## 2012-05-19 NOTE — ED Notes (Signed)
Pt remains at 96 percent while on RA

## 2012-05-19 NOTE — Student Note (Addendum)
Medical Student Progress Note    Interval:  Overnight, spiked fever (Tmax 39.1) and had desat to 88% and BP to 161/96. Was placed on 2L NC and started on vancomycin and piperacillin-tazobactam for possible infection from decubitus ulcers or pseudomonas pneumonia. Ceftriaxone was discontinued. Scheduled fondaparinux and dulcolax have not been administered yet.   Seen by urology and wound care yesterday.     Subjective: Feels about the same as yesterday, improved from presentation to urology clinic but still not at baseline. Patient reports feeling "cold and then hot" last night during desaturation, but did not clearly feel as though she had a fever. Has decreased appetite.  Tolerated fruit cup, had [employer] but after felt abdominal discomfortand bloating. She denies N/V. Last BM was 4 days ago (prior to admission) and she requests bisacodyl for constipation.. She feels that her respiratory secretions are building up due to decreased clearance and that the pulmonary toilet techniques used by the nurses here are not as effective as abdominal thrusts. She would like her nurses to use this technique instead.  Also requests Vicks for airway secretions.     Review of Systems: Reports heat/cold intolerance, questionable fever symptoms, and poor appetite as above. Additionally has continued full body shaking, worse than baseline and distressing but not painful. No dyspnea, chest pain, or palpitations. Continued urinary incontinence.    Active Medications:  -- Vancomycin 1500 mg IV three times daily  -- Piperacillin-tazobactam 4.5 g IV 4 times daily  -- Albuterol nebulizer 2.5 mg scheduled 4 times daily and q6 hrs PRN  -- bisacodyl 10 mg suppository BID (not administered)  -- Fondaparinux 2.5 mg SC daily (not administered)  Continuous Infusions:  -- 0.9% sodium chloride @ 200 ml/hr IV  DiscontinuedMedications:  Ceftriaxone 1g IV daily (given for 2 days)  PRN Meds:  diphenhydrAMINE (0x), albuterol neb (1x), oxybutynin (1x),  acetaminophen (2x), ondansetron (0x)    Objective:  24-Hour Vitals Range: BP: 104/58 - 161/96  Temp:  36.5-39.1  Pulse:  88-139  Resp 24-37  SpO2: 88-97     Physical Examination:  Vitals: BP 146/89  Pulse 78  Temp 36.4  Resp 22  SpO2 99 2L NC    General appearance: Comfortable, friendly, alert, and cooperative. Has NC in place. No acute distress.   HEENT: Well-healed scars on face and neck. JVD not visualized. Mucous membranes moist. No stridor.   Lungs: Diffuse rhonchi on lung exam.  Heart: regular rate and rhythm, S1, S2 normal, no murmur, click, rub or gallop  Abdomen: Obese abdomen, nondistended. Normal bowel sounds auscultated. Pt reports no sensation but has involuntary guarding with deep palpation of lower quadrants.  Skin: Decubitus ulcers not examined (no assistance with rolling)  Neurologic: Clonic muscle jerks in legs and abdomen present. Sensory level at C8. Brachioradialis reflex 2+ and symmetric. Biceps reflex brisk (2+) without contralateral spread or clonus. Patellar reflex brisk with contralateral spread but no clonus (3+) bilaterally. Ankle jerk with several beats of clonus bilaterally. Increased tone in lower extremities without spasticity.   Genitourinary: Deferred.     Lab Results: Labs reviewed. Pertinent findings include:   Chemistries: Na 141, K 3.3, CO2 21<-19, Cre .52  Hematology: WBC 8.6, HCT 37, PLT 75<-74  Microbiology: Urine (catheter) Culture: E coli >100,000 (cefazolin R, cipro R) and enterococcus 50,000; Blood cultures x2 NGTD    Assessment/Plan:  Cindy Ochoa is a 26 yo woman with a history of complete C6 quadriparesis with resulting neurogenic bladder managed with chronic foley catheterization, recurrent  UTIs, and autonomic instability who presented with sepsis (3/4 SIRS criteria and sources of infection including urine culture positive for E coli and enterococcus, possible developing pneumonia on CXR, and decubitus ulcers that were positive for MRSA and enterococcus 2  months ago). She has received 2 days of ceftriaxone and has been broadened to piperacillin-tazobactam and vancomycin overnight.   Current issues include sepsis, constipation, ulcers, hypokalemia, thrombocytopenia, and respiratory care.    1. Sepsis:  Clinically worsening vitals with fever, elevated BP, and hypoxia. She is w/o leukocytosis (WBC 8.6) and clinically appears comfortable, however patient's symptoms and response to infection will be atypical due to autonomic instability and low cervical quadriparesis. Had one episode of vomiting on Monday with concern now for aspiration pneumonia.  --Continue ceftriaxone for E coli UTI and possible PNA   --Vancomycin for MRSA and enterococcus  --Chest XRay to reassess for pneumonia/aspiration  --Continue maintenance fluids     2. Decubitus Ulcers: Do not appear infected and suspicion as source of infection low. Nevertheless, will want to continue coverage for MRSA given patient's atypical presentation for infection and unstable vital signs with hypoxia, tachycardia, tachypnea, and fever.   --Appreciate wound care recommendations.  --Pre-albumin level now and weekly  --Nutrition Consultation   --Keep head of bed at less than 30 degrees except when eating  --Clean ulcers with NS, apply aquacel Ag, cover with allevyn gentle dressing  --Change dressing every 2-3 days or when saturated  --Critic Aid Clear Moisture Barrier to protect dressing from urine moisture (do not apply over aquacel)  --T&P q2 hours    3. Hypokalemia - receiving NS at 200 ml/hr.   --KCl supplement 40 mEq/day    4.  Constipation: Last BM 4 days ago. Did not receive dulcolax yesterday.  --Dulcolax suppository today  --Will resume BID dosing as per home regimen  --Follow bowel movements    5. Respiratory Care: Has increased rhonchi and reports not getting adequate clearance with back cupping. Would like nurses to use home technique of abdominal thrusts and states respiratory therapy was fine with this  method.  --Have mother or respiratory therapy instruct nurses in abdominal thrust technique  --NC as needed, titrate to >90%.    6. Thrombocytopenia: Platelets stable at 75. History of HIT, explained fondaparinux was not the same as heparin to patient as she had refused yesterday's dose for this reason.  --Fondaparinux for DVT prophylaxis    7. Dispo: Pending treatment of infection.     Cindy Ochoa 6:23 AM 05/19/2012  PIC# [id number]

## 2012-05-19 NOTE — Progress Notes (Signed)
Pt complained of nausea which she felt was associated with the vancomycin IV hanging.  Vancomycin paused and medical student on pt's team to bedside to assess the pt.  Pt refused PRN zofran.  Vancomycin  resumed and completed, pt denies any complaints at this time.  Pt did not emesis during incident.    Odis Luster, RN

## 2012-05-19 NOTE — Progress Notes (Signed)
Pt arrived on unit at 0200. Pt moved to stryker bed, and oriented to unit, call bell, and room. Pt placed on telemetry, pt appeared to be tachycardic. Pt stated she was not in pain, and was not having any trouble breathing. VS taken, temp. and RR Improved from earlier vitals. Pt alert and oriented x3. Writer helped pt get comfortable in bed with repositioning.    Pt due for vancomycin before arrival to unit but it was not hung in ED. Writer spoke with provider, Ellamae Sia, MD, about pt's noted allergy to vancomycin and provider stated that pt had a successful run of Vancomycin in previous hospital stays, and she had spoken to pharmacy to verify appropriateness of pt receiving vanco. Writer obtained order for benadryl to administer in the event of an allergic reaction, but pt tolerated vancomycin fine with no symptoms of an allergic reaction.    Pt called out and asked for assist in coughing. Writer had pt turn and attempted cupping and postural drainage with pt. Pt asked writer to stop and stated "I'll have my mom do it when she gets here, she knows what to do".    Osvaldo Human, RN
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
## 2012-05-19 NOTE — Progress Notes (Signed)
NPN: Pt called writer into room and c/o SOB. Pt's O2 sat 86% on RA. Applied 3LNC and sat rose to 90%. Administered PRN nebulizer (see MAR). Pt's O2 sat fluctuating between 87% and 92%. Pt continued to c/o SOB. Contacted provider Soledad Gerlach Deshong. Provider to floor to assess Pt. Ordered chest xray and xopenex nebulizer. Xray to floor to obtain portable chest xray. Administered nebulizer as ordered (see MAR). Provider discontinued maintenance fluids. Contacted RT for chest physiotherapy machine, vest and flutter valve. Applied vest and started physiotherapy as ordered. Instructed Pt how to use flutter valve. Pt properly demonstrated correct use of flutter valve. No other issues at this time. Will continue to monitor.    Dixon Boos, RN

## 2012-05-19 NOTE — ED Notes (Signed)
Pt with drop in Oxygenation going down to 88. Pt attempting deep breath and cough. Writer cupped pt to help release congestion. Pt responded well pt placed on 2L NC responded well O2 saturation back up to 93%. Provider aware.

## 2012-05-19 NOTE — ED Notes (Signed)
Per provider ok to hang antibiotics before blood cultures and send to unit

## 2012-05-20 LAB — BASIC METABOLIC PANEL
Anion Gap: 13 (ref 7–16)
CO2: 22 mmol/L (ref 20–28)
Calcium: 8.6 mg/dL — ABNORMAL LOW (ref 8.8–10.2)
Chloride: 103 mmol/L (ref 96–108)
Creatinine: 0.48 mg/dL — ABNORMAL LOW (ref 0.51–0.95)
GFR,Black: 156 *
GFR,Caucasian: 135 *
Glucose: 99 mg/dL (ref 60–99)
Lab: 4 mg/dL — ABNORMAL LOW (ref 6–20)
Potassium: 3.4 mmol/L (ref 3.3–5.1)
Sodium: 138 mmol/L (ref 133–145)

## 2012-05-20 LAB — CBC
Hematocrit: 35 % (ref 34–45)
Hemoglobin: 11.5 g/dL (ref 11.2–15.7)
MCV: 93 fL (ref 79–95)
Platelets: 66 10*3/uL — ABNORMAL LOW (ref 160–370)
RBC: 3.8 MIL/uL — ABNORMAL LOW (ref 3.9–5.2)
RDW: 15 % — ABNORMAL HIGH (ref 11.7–14.4)
WBC: 7.4 10*3/uL (ref 4.0–10.0)

## 2012-05-20 LAB — CRP: CRP: 108 mg/L — ABNORMAL HIGH (ref 0–10)

## 2012-05-20 LAB — VANCOMYCIN, TROUGH: Vancomycin Trough: 17 ug/mL (ref 10.0–20.0)

## 2012-05-20 LAB — AEROBIC CULTURE

## 2012-05-20 MED ORDER — ZINC SULFATE 220 MG PO CAPS *I*
220.0000 mg | ORAL_CAPSULE | Freq: Every day | ORAL | Status: DC
Start: 2012-05-20 — End: 2012-05-23
  Administered 2012-05-20 – 2012-05-23 (×4): 220 mg via ORAL
  Filled 2012-05-20 (×4): qty 1

## 2012-05-20 MED ORDER — NICOTINE 14 MG/24HR TD PT24 *I*
1.0000 | MEDICATED_PATCH | Freq: Every day | TRANSDERMAL | Status: DC
Start: 2012-05-20 — End: 2012-05-21
  Administered 2012-05-20: 1 via TRANSDERMAL
  Filled 2012-05-20 (×2): qty 1

## 2012-05-20 MED ORDER — LEVALBUTEROL HCL 0.63 MG/3ML IN NEBU *I*
0.6300 mg | INHALATION_SOLUTION | Freq: Four times a day (QID) | RESPIRATORY_TRACT | Status: DC | PRN
Start: 2012-05-20 — End: 2012-05-23
  Administered 2012-05-20 (×2): 0.63 mg via RESPIRATORY_TRACT
  Filled 2012-05-20 (×8): qty 3

## 2012-05-20 MED ORDER — VITAMIN C 250 MG PO TABS *I*
250.0000 mg | ORAL_TABLET | Freq: Every day | ORAL | Status: DC
Start: 2012-05-20 — End: 2012-05-23
  Administered 2012-05-20 – 2012-05-23 (×4): 250 mg via ORAL
  Filled 2012-05-20 (×4): qty 1

## 2012-05-20 MED ORDER — NICOTINE PATCH REMOVAL *I*
Freq: Every day | Status: DC
Start: 2012-05-21 — End: 2012-05-21
  Filled 2012-05-20: qty 1

## 2012-05-20 MED ORDER — CENTURY SENIOR PO TABS *A*
1.0000 | ORAL_TABLET | Freq: Every day | ORAL | Status: DC
Start: 2012-05-20 — End: 2012-05-23
  Administered 2012-05-20 – 2012-05-23 (×4): 1 via ORAL
  Filled 2012-05-20 (×4): qty 1
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
## 2012-05-20 NOTE — Progress Notes (Signed)
Social work met with patient and her mother who requested some assistance on obtaining bus passes for patient mother and patient son.  Patient stated that her mother does not have the finances to be able to pay for her bus passes.  Social work explained that social work is unable to assist with bus passes unless it is necessary for discharge.  Patient also requesting food vouchers.  Social work was able to assist patients family with 3 food vouchers that had been donated by another family.  Patient appreciates the assistance.  Social work will continue to follow and be available for any additional social work needs.

## 2012-05-20 NOTE — Progress Notes (Signed)
Dr. Pollie Friar was paged at 1845 about pt's BP 160/90 manual, Pt denies any headaches.  Dr. Pollie Friar returned phone call and no intervention at this time.  Odis Luster, RN

## 2012-05-20 NOTE — Consults (Signed)
Medical Nutrition Therapy Brief Note:  Writer visited pt today as was unable to speak with her yesterday. Pt reports poor Po intake. Stated that had not had anything for dinner yesterday or breakfast today. She had scandishakes in the past and would like to receive them again while intake is poor. Also discussed Juven supplement for wound healing. Pt agreeable to trial 2x daily.    Plan:  1. Please add scandishake BID.  2. Continue Juven BID.   3. Continue Centrum silver, Zinc and Vit C.  4. Encourage Po intake, especially protein-rich foods.  5. Will continue to follow per high nutrition risk protocol.    Elige Ko, MS, RD    [phone]

## 2012-05-20 NOTE — Student Note (Addendum)
Medical Student Progress Note    Overnight Events: Pt c/o SOB with SpO2 to 86%, persistent SOB with 3LNC and albuterol. Night team and respiratory therapy were called. Maintenance fluids were D/C'd and pt given xopenex. CXR obtained. O2 sats improved with xopenex and physiotherapy machine and vest.    Subjective: C/o nausea and increased muscle spasms, particularly in chest last night. Had 1 episode of mucousy emesis around dinner time last night. One BM yesterday, patient still feels constipated. Reports poor PO intake due to nausea, had a sandwich and 2 cups cups of water. Also complains of tobacco cravings. Smoke 7 cigs/day. Would like to try the patch. No longer complaining of increased SOB. Denies denies fever, chills, vomiting.    Medications:  Ceftriaxone 1g IV daily for UTI/suspected PNA  Vancomycin 1250 g IV q8h for suspected MRSA  Bisacodyl for constipation BID  Albuterol 2.5 mg neb 4x daily scheduled & q6h PRN for SOB  Fondaparinux 2.5 mg subQ for DVT prophylaxis  Oxybutynin q8h PRN for bladder spasms (x0)  Acetaminophen PRN for mild pain/fever (x0),   Ondansetron q6 PRN for nausea (x2)    Overnight Vitals: Tmax 38, RR 20-28/SpO2 87-97 on 3LNC, HR 100-133  Physical Examination:  Vitals: BP 164/92  HR 91  Temp( 36.9   Resp 34   General appearance: Comfortable appearing, was sleeping peacefully and awoke easily. Pleasant and conversational.  Lungs: Diffuse rhonchi and wheezing, unchanged.  Cardiovascular: Unchanged. No  No MRG  Abdomen: Significant abdominal clonus limited exam.  GU: Unable to visualize if foley in place (assistance required)  Extremities: Unchanged from previous. No edema or redness.   Skin: No rashes, bleeding, bruising, petechiae. Decubitus ulcers not visualized. Heels without ulceration.    Lab Results: Labs reviewed. Pertinent findings include:   WBC 7.4, PLT 66<-75, CRP 108, Blood cultures NGTD    Radiology Impressions:  Portable chest AP (5/23): Improving RLL opacities, likely  resolving atelectasis or edema    Assessment/Plan:  26 yo female with C6 quadriparesis complicated by neurogenic bladder, recurrent UTIs, and autonomic instability admitted for sepsis with urine culture positive for E coli and enterococcus, possible aspiration pneumonia, and decubitus ulcers previously with MRSA and enterococcus.    1. Sepsis/UTI/PNA - On vanc and CTX. Tmax 38, RR 20-28, HR 100-133. WBC 7.4, Blood cx NGTD. Had 1 episode of emesis last evening and has had increased chest muscle spasms. She continues to have nausea and poor appetite with no chills or subjective fevers. SOB has resolved. Exam significant for O2 sat 97 on 3LNC, significant abdominal clonus, wheezing and rhonchi unchanged. CXR last night shows improvement of RLL opacities, likely resolving atelectasis or edema vs improving aspiration.  --Vanc trough before 4th dose  --Pulmonary toilet (ATC nebs, abdominal thrust, flutter valve) Consider music therapy  --Titrate NC to >92%  --Encourage smoking cessation    2. Decubitus Ulcers:  Did not visualize this morning (no assistance in rolling). Wound care following. Will contact wound care today regarding whether pt can use home Calmoseptine lotion.   --Check ulcers later today   --f/u wound care about calmoseptine lotion    3. Malnutrition:  Nutrition saw pt yesterday. She has increased protein requirement and high nutritional risk. Low prealbumin (depleted protein stores vs. infection - CRP 108, infection more likely). Stated weight at admission 190 lbs (86.2 kg). Has poor PO intake and appears dry on exam. Off maintenance fluids, lytes stable.   --Juven BID and daily zinc, centrum silver, and vit C  for wound healing  --Weekly weights, prealbumin  --Regular diet  --Remain off maintenance fluids, encourage adequate PO intake.    4. Constipation: Had BM yesterday with improvement in cramping and nausea.  --BID bisacodyl as per home regimen.     5. Thrombocytopenia - PLT 66<-75. History of ITP,  HIT, and peripartum thrombocytopenia. Received fondaparinux yesterday for DVT prophylaxis. No rashes, bleeding, bruising, or petechiae. LMP April 15.  --Daily CBC  --Transfuse if PLT <10 or bleeding <50.     6. Tobacco Dependence: Patient reporting cravings, requests nicotine patch.  --Nicotine patch PRN    7. Dispo: pending resolution of infection    Carey Bullocks 6:19 AM 05/20/2012  PIC# [id number]

## 2012-05-21 MED ORDER — NICOTINE POLACRILEX 2 MG MT LOZG *I*
2.0000 mg | LOZENGE | OROMUCOSAL | Status: DC | PRN
Start: 2012-05-21 — End: 2012-05-23

## 2012-05-21 NOTE — Progress Notes (Addendum)
Pt and pt's mother believe the chest compression vest tx does not help. Pt's mother assists pt with cough assist. Pt refuses the chest compression vest tx at this time. Lyndal Pulley, MD, paged and notified. Per C. Tarolli, MD, he will discuss it with the team in the morning to decide on d/c'ing the order or not. Tami Ribas, RN

## 2012-05-21 NOTE — Plan of Care (Signed)
Pt has mother at bedside assisting her with what she needs. Pt not getting OOB on her own, calling out appropriately. Pt has good PO intake, full assist feed from her mother. Writer will continue to assess throughout shift.    Osvaldo Human, RN

## 2012-05-21 NOTE — Progress Notes (Signed)
6213: Pt's BP 138/95 auto, pt asymptomatic.   1024: Pt's BP 142/92 manual, pt asymptomatic. Pearson Forster MD paged and notified, no new orders at this time. Tami Ribas, RN
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
## 2012-05-21 NOTE — Progress Notes (Signed)
Pt's BP 159/92. Pt asymptomatic. Lyndal Pulley MD paged and notified. No new orders at this time. Tami Ribas, RN

## 2012-05-22 LAB — CBC AND DIFFERENTIAL
Baso # K/uL: 0 10*3/uL (ref 0.0–0.1)
Basophil %: 0.4 % (ref 0.1–1.2)
Eos # K/uL: 0.1 10*3/uL (ref 0.0–0.4)
Eosinophil %: 2.4 % (ref 0.7–5.8)
Hematocrit: 38 % (ref 34–45)
Hemoglobin: 12.4 g/dL (ref 11.2–15.7)
Lymph # K/uL: 1.9 10*3/uL (ref 1.2–3.7)
Lymphocyte %: 35.5 % (ref 19.3–51.7)
MCV: 92 fL (ref 79–95)
Mono # K/uL: 0.5 10*3/uL (ref 0.2–0.9)
Monocyte %: 9 % (ref 4.7–12.5)
Neut # K/uL: 2.9 10*3/uL (ref 1.6–6.1)
Platelets: 72 10*3/uL — ABNORMAL LOW (ref 160–370)
RBC: 4.2 MIL/uL (ref 3.9–5.2)
RDW: 14.3 % (ref 11.7–14.4)
Seg Neut %: 52.7 % (ref 34.0–71.1)
WBC: 5.5 10*3/uL (ref 4.0–10.0)

## 2012-05-22 MED ORDER — GUAIFENESIN 600 MG PO TB12 *I*
600.0000 mg | ORAL_TABLET | Freq: Two times a day (BID) | ORAL | Status: DC
Start: 2012-05-22 — End: 2012-05-23
  Administered 2012-05-22 – 2012-05-23 (×2): 600 mg via ORAL
  Filled 2012-05-22 (×3): qty 1

## 2012-05-22 NOTE — Progress Notes (Signed)
PT order received.  Pt's currently on bedrest.  Will need updated activity orders to evaluation pt.  Will f/u as appropriate.      Lavonia Dana PT, DPT  Pager # [phone]

## 2012-05-22 NOTE — Progress Notes (Signed)
Pt's IV expired today. IV team called. IV team had 2 unsuccessful attempts. Lincoln Brigham, MD, paged and notified. Per Lincoln Brigham, MD, a 24 hour extension was ordered for the peripheral IV. Tami Ribas, RN
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
## 2012-05-22 NOTE — Progress Notes (Cosign Needed)
IV Team: Called to replace PIV. This Clinical research associate evaluated patient in great length and attempted x2 without success. Staff made aware.

## 2012-05-23 DIAGNOSIS — J189 Pneumonia, unspecified organism: Secondary | ICD-10-CM | POA: Diagnosis present

## 2012-05-23 LAB — BLOOD CULTURE
Bacterial Blood Culture: 0
Bacterial Blood Culture: 0

## 2012-05-23 MED ORDER — CEFTRIAXONE SODIUM 1G IN DEXTROSE 50 ML *I*
1.0000 g | INTRAVENOUS | Status: AC
Start: 2012-05-23 — End: 2012-05-23
  Administered 2012-05-23: 1 g via INTRAVENOUS
  Filled 2012-05-23: qty 50

## 2012-05-23 MED ORDER — OXYBUTYNIN CHLORIDE 5 MG PO TABS *I*
5.0000 mg | ORAL_TABLET | Freq: Three times a day (TID) | ORAL | Status: DC | PRN
Start: 2012-05-23 — End: 2012-08-15

## 2012-05-23 MED ORDER — CEFPODOXIME PROXETIL 100 MG PO TABS *I*
100.0000 mg | ORAL_TABLET | Freq: Two times a day (BID) | ORAL | Status: AC
Start: 2012-05-23 — End: 2012-05-30

## 2012-05-23 MED ORDER — ONDANSETRON HCL 2 MG/ML IV SOLN *I*
4.0000 mg | Freq: Four times a day (QID) | INTRAMUSCULAR | Status: DC | PRN
Start: 2012-05-23 — End: 2012-05-23

## 2012-05-23 MED ORDER — ACETAMINOPHEN 325 MG PO TABS *I*
650.0000 mg | ORAL_TABLET | Freq: Four times a day (QID) | ORAL | Status: DC | PRN
Start: 2012-05-23 — End: 2012-05-23

## 2012-05-23 NOTE — Discharge Instructions (Signed)
Brief Summary of Your Hospital Course (including key procedures and diagnostic test results):  You came to the hospital with chills, a fever, and nausea after being seen by your urologist a few days before. You were given fluids through an IV and medication to prevent nausea. Your blood was checked for infection but there was no bacteria growing in it. Your urine was found to be growing a bacteria called E. Coli and you were started on an antibiotic called Ceftriaxone to treat the urinary tract infection. You were seen by urology who suggested you continue to have the foley cathter in place and use ditropan as needed for bladder spasms. The ulcers on your sacrum were cared for by nursing and you were seen by the wound care specialists. Despite the antibiotics from the urinary tract infection, you continued to have a fever for a few days so a second antibiotic called Vancomycin was added as there was a concern you may also have a pneumonia. An x-ray of the chest showed a possible pneumonia in the right lower lobe of your lung. You were given Mucinex, breathing treatments, and chest physiotherapy. After a few days you began to improve, no longer had a fever, and were just continued on one antibiotic. Your appetite improved as well and you were seen by nutrition while you were here and given a multivitamin and some other supplements to help with wound healing.     Your instructions:  1) Please follow up in the [facility] on May 31st    2) Please follow up with your urologist Dr. Lisabeth Pick and your PCP. Please call the [facility] to make an appointment to see your PCP, Dr. Julian Reil within a week. The clinic number [phone]    3) Please continue to take the antibiotic CEFPODOXIME. Take 1 tablet (100 mg) twice a day for 7 days. Your first dose will be tomorrow morning.     What to do after you leave the hospital:    Recommended diet: regular diet    Recommended activity: activity as  tolerated    Wound Care: none needed    If you experience any of these symptoms within the first 24 hours after discharge:Uncontrolled pain, Fever of 101 F. or greater, Chills, Increased redness, drainage or swelling sacral ulcers, Vomiting or Nausea, cloudy, foul smelling urine, confusion, please follow up with the discharge attending Dr. Christie Nottingham at phone-number: [phone]    If you experience any of these symptoms 24 hours or more after discharge: Uncontrolled pain, Fever of 101 F. or greater, Chills, Increased redness, drainage or swelling sacral ulcers, Vomiting or Nausea, cloudy, foul smelling urine, confusion,  please follow up with your PCP:  Andrey Campanile, MD [phone]      Care of a Urinary Tract Infection (UTI)     A urinary tract infection is usually caused by a germ. A UTI is usually cured with antibiotics. Take all the medicine until it is gone. You may feel better in a few days, but if you do not take all your medicine the infection may not get well. It can become more difficult to treat. You are usually better in 7 to 10 days.     HOME CARE      Drink a lot of fluid. Drink 3 to 4 quarts a day. Drink:  l Cranberry juice.  l Water.      Avoid:  l Caffeine.  l Tea.  l Pop or soda.   l Alcohol.  Only take medicine as directed by your doctor.     TO PREVENT FURTHER INFECTIONS:      Pee often.    After a bowel movement, women should wipe from front to back. Use each tissue only once.   Pee before and after sexual intercourse.     GET HELP IF YOU HAVE:      Back pain.   Fever.   Feel sick to your stomach.    Vomiting.   Symptoms are not better in 3 days.   Return sooner if you are getting worse.     GET HELP RIGHT AWAY IF:      You have severe back pain or lower abdominal pain.   You develop chills and fever.   There is continued burning with peeing (urination).     Document Released: 06/01/2008  Document Re-Released: 10/11/2009  [facility] Patient Information 2011 [place],  [place].
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
## 2012-05-23 NOTE — Progress Notes (Signed)
Contacts: Patient's RN, [employer], [employer]    SW arranged transportation home for patient via Building services engineer.  Patient to be picked up between 4-4:30.  Dory Horn, LCSW  Pager [phone]

## 2012-05-23 NOTE — Progress Notes (Addendum)
Patient left the unit at 1620, she was transported by [facility], she was in a positive mood. Romero Liner, RN

## 2012-05-23 NOTE — Progress Notes (Signed)
Pt's HR increased to 148 on telemetry, non-sustaining. Writer checked on pt who was trying to cough up mucus. Writer then assisted pt in cough assist to get the sputum out. Pt's HR when back down to low 100's. Pt asymptomatic. Cindy Citrin MD paged and notified. No new orders. Tami Ribas, RN

## 2012-05-23 NOTE — Progress Notes (Signed)
Pt was discharged and now awaiting arrival of a stretcher van to bring her home, which was arranged by social work. Pt verbalizes understanding of discharge instructions and when her follow-up appointments are scheduled/to be scheduled. Pt is going home with 18 french foley catheter in place, as pt utilizes a foley catheter at home. Pt received her prescriptions from outpatient pharmacy. Pt also received prescription wound care supply to supply her until her wound care appointment on 5/31. Pt has aides who will be at her home to care for her when it is needed. Pt's mother is also available to the pt at home. Tami Ribas, RN
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
## 2012-05-27 ENCOUNTER — Ambulatory Visit: Payer: Self-pay | Admitting: Bariatric Surgery

## 2012-05-27 NOTE — Discharge Summary (Signed)
Discharge Summary       Admit date: 05/17/2012         Discharge date and time: 05/23/12  Admitting Physician: Barnet Glasgow May, MD   Discharge Attending: Dr. Christie Nottingham    Patient: Cindy Ochoa Age: 26 y.o. Date of Birth: 1986-02-26 [id number]    Chief Complaint: nausea, vomiting, chills     Principal Problem: Sepsis    Details of Admission: as per admission H&P    Discharge Diagnoses:  Active Hospital Problems    Diagnosis   . Sepsis   . Community acquired pneumonia     RLL infiltrate     . UTI (lower urinary tract infection)      Resolved Hospital Problems    Diagnosis   No resolved problems to display.         Hospital Course (including key diagnostic test results):    [name] is a 26yo female with history of quadriplegia from C5 injury, with associated neurogenic bladder (chronic foley) and recurrent UTIs.     1. Sepsis, likely secondary to UTI and ?aspiration pneumonia: Pt was initially treated with ceftriaxone and vancomycin. Aerobic culture revealed E. Coli and enterococcus. CXR showed RLL infiltrate, concerning for aspiration/community acquired pneumonia. Antibiotics were narrowed to ceftriaxone, and she was discharged on cefpodoxime to complete a 14 day course.    2. Neurogenic bladder: Pt was seen by urology re: foley. She will have outpatient follow up with urology.        Key Exam Findings at Discharge:    Vitals: Blood pressure 143/93, pulse 82, temperature 35.7 C (96.3 F), temperature source Temporal, resp. rate 20, height 1.626 m (5\' 4" ), weight 86.183 kg (190 lb), last menstrual period 04/11/2012, SpO2 97.00%.    Admission Weight: weight: 86.183 kg (190 lb)  Discharge Weight: weight: 86.183 kg (190 lb)       Pending Test Results: none     Consulting Providers: urology    Discharged Condition: stable    Discharge medications, instructions, and follow-up plans: as per After Visit Summary  Disposition: Home with health-care services      Signed: Rosendo Gros, MD  On: 05/27/2012  at: 3:15 PM

## 2012-05-30 ENCOUNTER — Encounter: Payer: Self-pay | Admitting: Gastroenterology

## 2012-05-30 ENCOUNTER — Ambulatory Visit: Payer: Self-pay | Admitting: Surgery

## 2012-05-30 ENCOUNTER — Encounter: Payer: Self-pay | Admitting: Surgery

## 2012-05-30 VITALS — BP 121/87 | HR 73 | Temp 96.6°F

## 2012-05-30 DIAGNOSIS — L89159 Pressure ulcer of sacral region, unspecified stage: Secondary | ICD-10-CM

## 2012-05-30 DIAGNOSIS — L8993 Pressure ulcer of unspecified site, stage 3: Secondary | ICD-10-CM

## 2012-05-30 NOTE — Progress Notes (Signed)
[facility]  Progress Note      Name: Cindy Ochoa, Cindy Ochoa  MRN: [id number]  DOB: 1986-09-06      Date of Encounter: 05/30/2012      Medical Providers    Referring: Cindy Campanile, MD   PCP: Cindy Campanile, MD       Chief Complaint      Chief Complaint   Patient presents with   . Wound Check     Sacrum and ischium pressure ulcer          History of Present Ilness   Cindy Ochoa is a 26 y.o. female. She returns for a follow of visit for management of pressure  wound(s) of the bilateral buttocks. She is post hospitalization for pneumonia and UTI. She has [facility] for aid services. Dressing performed by care aid.      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      Allergies   She is allergic to heparin; nitrofurantoin; and vancomycin.      Medications  Current Outpatient Prescriptions   Medication Sig Note   . cefpodoxime (VANTIN) 100 MG tablet Take 1 tablet (100 mg total) by mouth 2 times daily for 7 days    . oxybutynin (DITROPAN) 5 MG tablet Take 1 tablet (5 mg total) by mouth 3 times daily as needed for  Incontinence    . 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 bisacodyl (DULCOLAX) 10 MG suppository Place 1 suppository (10 mg total) rectally daily as needed for Constipation    . Hydroactive Dressings (TEGADERM HYDROCOLLOID) MISC Apply 1 Film topically daily    . Non-System Medication Allevyn Hydrocellular adhesive dressing  3" x 3" Dx 707.03 Decub ulcer of Sacral region. Disp 3 box of 10  Pt changing dressing daily 03/18/2012: Never got    . guaiFENesin (MUCINEX) 600 MG 12 hr tablet Take 1 tablet (600 mg total) by mouth 2 times daily   Swallow whole. Do not crush, break, or chew.    . Non-System Medication Mattress, [employer]  Use as directed.  Dx: 344.04, 596.54, 707.03, 707.05, 788.33    . Non-System Medication Hospital Bed, Semi-Electric (Head and Foot Adjustments) w/ any type side rail  Dx: 344.04, 596.54, 707.03, 707.05, 788.33    . Non-System Medication French Foley Catheters - Silicone  Diagnosis 596.54, 344.04  Fax to [facility]    . fluticasone-salmeterol (ADVAIR DISKUS) 250-50 MCG/DOSE diskus inhaler Inhale 1 puff into the lungs 2 times daily      . Skin Protectants, Misc. OINT Apply 1 Tube topically 2 times daily      . sertraline (ZOLOFT) 50 MG tablet Take  1 tablet (50 mg total) by mouth daily      . Non-System Medication One Hoyer Lift  Diagnosis:  Quadriplegic ICD-9 344.04    . disposable gloves 1 box Disposable Medium size gloves      No current facility-administered medications for this visit.          Review of Systems   A comprehensive review of systems was negative.    Past Surgical History   She has past surgical history that includes Multiple injuries (04/29/2005 ); craniotomy (04/29/2005); Cervical spine surgery (04/29/2005); Cervical spine surgery (05/04/2005); Tracheostomy tube placement (05/15/05); Gastrostomy tube placement (05/15/05); IVC filter (May 2006 ); Urethral plication (12/19/2009); and  Left Tibia fracture (06/01/07).      Social History   She reports that she has quit smoking Cigarettes.  She had a 1.5 pack-year smoking history.      Family History   Her family history includes Diabetes in her maternal grandmother and mother; High cholesterol in her mother; Osteoarthritis in her maternal grandmother; and Stroke in her maternal grandfather.      Vital Signs  Filed Vitals:    05/30/12 1038   BP: 121/87   Pulse: 73   Temp: 35.9 C (96.6 F)         Physical Exam   General appearance: alert, well appearing, and in no distress, oriented to person, place, and time, acyanotic, in no respiratory distress and well hydrated.   Heart: normal rate and regular rhythm.    Lungs: unlabored respirations, no intercostal retractions or accessory muscle use. No rales or rhonchi   Extremities: extremities normal, atraumatic, no cyanosis or edema. pulses not examined.   Neurologic: sensation not intact in feet       Wound #1   Location: right buttocks   Healed     Wound #2   Location:left buttocks   Healed     Wound #3   Location: Left ischium   Healed     Wound #4   Location: Right ischium   Size:0.7 cm (length), 2.5 cm (width),  0.4 cm (depth); and  post-debridement :0.9 cm (length), 2.6 cm (width), 0.3 cm (depth)    Tunnneling: no   Undermining: no   Drainage: moderate   Odor: no   Obvious infection: no   Edema: none   Tissue composition: mostly healthy granulation with areas of slough; closed and rolled edges   Surrounding skin: healthy   Wound debrided today: yes    Procedure Note:    The wound is cleansed. Lidocaine 2% topical applied for 10 minutes for comfort. Using a curette the unhealthy tissues and rolled wound edges; were debrided to subcutaneous level; Bleeding was controlled with gauze. Patient tolerated well. Total area debrided was 2.0 cm2. Dressings applied.      Pertinent Studies  Prior records reviewed.      Assessment   Cindy Ochoa is a 26 y.o. female with resolving Stage 2 pressure ulcers improving  with better skin care and offloading.     Plan     Local wound care:   Continue skin barrier cream to protect from further breakdown and prevent progression of Stage 2 ulcers.    Patient being followed by Urology for possible suprapubic tube vs. Surgery after infection resolves     Follow up in 7-10 days

## 2012-06-08 ENCOUNTER — Encounter: Payer: Self-pay | Admitting: Gastroenterology

## 2012-06-08 ENCOUNTER — Telehealth: Payer: Self-pay

## 2012-06-08 ENCOUNTER — Ambulatory Visit: Payer: Self-pay | Admitting: Surgery

## 2012-06-08 ENCOUNTER — Ambulatory Visit: Payer: Self-pay | Admitting: Internal Medicine

## 2012-06-08 NOTE — Telephone Encounter (Signed)
Noted  

## 2012-06-10 ENCOUNTER — Ambulatory Visit: Payer: Self-pay | Admitting: Bariatric Surgery

## 2012-06-15 ENCOUNTER — Telehealth: Payer: Self-pay

## 2012-06-15 NOTE — Telephone Encounter (Signed)
I called Cindy Ochoa and told her I would put the dressing order in for her. I told her I am sure that [facility] will fill them when they are able too and will be contacting her soon.

## 2012-06-23 ENCOUNTER — Encounter: Payer: Self-pay | Admitting: Gastroenterology

## 2012-07-08 ENCOUNTER — Encounter: Payer: Self-pay | Admitting: Bariatric Surgery

## 2012-07-08 ENCOUNTER — Ambulatory Visit: Payer: Self-pay | Admitting: Bariatric Surgery

## 2012-07-08 VITALS — BP 96/53 | HR 71 | Temp 97.7°F | Resp 20 | Ht 65.0 in

## 2012-07-08 DIAGNOSIS — L8993 Pressure ulcer of unspecified site, stage 3: Secondary | ICD-10-CM

## 2012-07-08 NOTE — Progress Notes (Signed)
[facility]  Progress Note      Name: Cindy Ochoa, Cindy Ochoa  MRN: [id number]  DOB: 11-11-1986      Date of Encounter: 07/08/2012      Medical Providers    Referring: Andrey Campanile, MD   PCP: Andrey Campanile, MD       Chief Complaint      Chief Complaint   Patient presents with   . Wound Check     for pressure ulcers          History of Present Ilness   Cindy Ochoa is a 26 y.o. female. She returns for a follow of visit for management of Right ischial pressure ulcer.    She has a prior history of similar pressure ulcers that have been healed in the past with better moisture control and off-loading.  Patient and aid state that this ulcer just reformed recently.     Patient is scheduled to see her Urologist next week for possible supra-pubic tube vs other definitive procedure for better urinary diversion.    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      Allergies   She is allergic to heparin; nitrofurantoin; and vancomycin.      Medications  Current Outpatient Prescriptions   Medication Sig Note   . oxybutynin (DITROPAN) 5 MG  tablet Take 1 tablet (5 mg total) by mouth 3 times daily as needed for Incontinence    . 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 bisacodyl (DULCOLAX) 10 MG suppository Place 1 suppository (10 mg total) rectally daily as needed for Constipation    . Hydroactive Dressings (TEGADERM HYDROCOLLOID) MISC Apply 1 Film topically daily    . Non-System Medication Allevyn Hydrocellular adhesive dressing  3" x 3" Dx 707.03 Decub ulcer of Sacral region. Disp 3 box of 10  Pt changing dressing daily 03/18/2012: Never got    . guaiFENesin (MUCINEX) 600 MG 12 hr tablet Take 1 tablet (600 mg total) by mouth 2 times daily   Swallow whole. Do not crush, break, or chew.    . Non-System Medication Mattress, [employer]  Use as directed.  Dx: 344.04, 596.54, 707.03, 707.05, 788.33    . Non-System Medication Hospital Bed, Semi-Electric (Head and Foot Adjustments) w/ any type side rail  Dx: 344.04, 596.54, 707.03, 707.05, 788.33    . Non-System Medication French Foley Catheters - Silicone  Diagnosis 596.54, 344.04  Fax to [facility]    . fluticasone-salmeterol (ADVAIR DISKUS) 250-50 MCG/DOSE diskus inhaler Inhale 1 puff into the lungs 2 times daily      . Skin Protectants, Misc. OINT Apply 1 Tube topically 2 times daily      .  sertraline (ZOLOFT) 50 MG tablet Take 1 tablet (50 mg total) by mouth daily      . Non-System Medication One Hoyer Lift  Diagnosis:  Quadriplegic ICD-9 344.04    . disposable gloves 1 box Disposable Medium size gloves      No current facility-administered medications for this visit.         Review of Systems   Unchanged from prior visit.    Past Surgical History   She has past surgical history that includes Multiple injuries (04/29/2005 ); craniotomy (04/29/2005); Cervical spine surgery (04/29/2005); Cervical spine surgery (05/04/2005); Tracheostomy tube placement (05/15/05); Gastrostomy tube placement (05/15/05); IVC filter  (May 2006 ); Urethral plication (12/19/2009); and Left Tibia fracture (06/01/07).      Social History   She reports that she quit smoking about 6 weeks ago. Her smoking use included Cigarettes. She started smoking about 6 months ago. She has a 1.5 pack-year smoking history. She quit smokeless tobacco use about 7 years ago. She reports that she uses illicit drugs (Marijuana). She reports that she does not drink alcohol.      Family History   Her family history includes Diabetes in her maternal grandmother and mother; High cholesterol in her mother; Osteoarthritis in her maternal grandmother; and Stroke in her maternal grandfather.      Vital Signs  Filed Vitals:    07/08/12 1118   BP: 96/53   Pulse: 71   Temp: 36.5 C (97.7 F)   Resp: 20   Height: 1.651 m (5\' 5" )         Physical Exam   General appearance: alert, well appearing, and in no distress, oriented to person, place, and time, acyanotic, in no respiratory distress and well hydrated.   Heart: normal rate and regular rhythm.    Lungs: unlabored respirations, no intercostal retractions or accessory muscle use.   Extremities: extremities normal, atraumatic, no cyanosis or edema. pulses not examined.   Neurologic: sensation not intact in feet       Wound #6   Location: Right ischium   Size:1.2 cm (length), 0.8 cm (width), 0.3 cm (depth) 0.4 cm  post-debridement - Stage 3   Tunnneling: no   Undermining: no   Drainage: small.   Odor: no   Obvious infection: no   Edema: none   Tissue composition: healthy   Surrounding skin: healthy   Wound debrided today: yes    Procedure Note:    Patient was sterile prepped and draped as appropriate.    Topical/Local anesthetic:yes    Debridement level:Wound # 6 was debrided of devitalized tissue to muscle with a a surgical curette. Total area debrided: approx. 1 cm sq cm sq.  Good hemostasis obtained. Patient tolerated procedure well.    Pertinent Studies  Prior records reviewed.      Assessment   Cindy Ochoa is a 26 y.o. female with  recurrent Stage 3 pressure ulcer.      Plan   Wound debrided today: yes. See procedure note.     Local wound care:   Continue skin barrier cream and better offloading to protect from further breakdown and prevent progression.    Patient being followed by Urology for possible suprapubic tube vs. surgery.     Follow up in 2-3 weeks.

## 2012-07-11 ENCOUNTER — Encounter: Payer: Self-pay | Admitting: Internal Medicine

## 2012-07-11 ENCOUNTER — Ambulatory Visit: Payer: Self-pay | Admitting: Internal Medicine

## 2012-07-11 ENCOUNTER — Encounter: Payer: Self-pay | Admitting: Gastroenterology

## 2012-07-11 VITALS — BP 120/90 | HR 74 | Temp 95.2°F | Ht 65.0 in | Wt 190.0 lb

## 2012-07-11 DIAGNOSIS — L910 Hypertrophic scar: Secondary | ICD-10-CM

## 2012-07-11 DIAGNOSIS — G8254 Quadriplegia, C5-C7 incomplete: Secondary | ICD-10-CM

## 2012-07-11 MED ORDER — DOCUSATE SODIUM 100 MG PO CAPS *I*
100.0000 mg | ORAL_CAPSULE | Freq: Two times a day (BID) | ORAL | Status: DC
Start: 2012-07-11 — End: 2012-08-15

## 2012-07-12 ENCOUNTER — Encounter: Payer: Self-pay | Admitting: Internal Medicine

## 2012-07-12 NOTE — Progress Notes (Signed)
Reason For Visit: The primary encounter diagnosis was Keloid. A diagnosis of Quadriplegia, C5-C7, incomplete was also pertinent to this visit.      HPI:  Cindy Ochoa is 26 y.o. year old female    Quadriplegic C5-C7 incomplete  Patient lives at home by herself with and has nursing and aid service  She is paralyzed and is now in a motorized wheelchair.  She has an apartment that is handicapped accessible.    Aid help with preparing food, light housekeeping, laundry, toileting, bathing   She is incontinent of stool.  She needs help with feeding her self.     RN help with medications and dressing changes for ulcer on buttocks.    She was previously trached, she was recently hospitalized in May for pneumonia and UTI    She is following up with urology for suprapubic tube. She is has indwelling cath that is always leaking and cause UTI as well as skin breakdown.     She quit smoking    She has requesting plastic surgery referral for scarring on her neck from trach.  She also has scarring on her chest and breast   Medications:     Current Outpatient Prescriptions   Medication Sig Note   . oxybutynin (DITROPAN) 5 MG tablet Take 1 tablet (5 mg total) by mouth 3 times daily as needed for Incontinence    . 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 bisacodyl (DULCOLAX) 10 MG suppository Place 1 suppository (10 mg total) rectally daily as needed for Constipation    . Hydroactive Dressings (TEGADERM HYDROCOLLOID) MISC Apply 1 Film topically daily    . Non-System Medication Allevyn Hydrocellular adhesive dressing  3" x 3" Dx 707.03 Decub ulcer of Sacral region. Disp 3 box of 10  Pt changing dressing daily 03/18/2012: Never got    . Non-System Medication Mattress, [employer]  Use as directed.  Dx: 344.04, 596.54, 707.03, 707.05, 788.33    . Non-System Medication Hospital Bed, Semi-Electric (Head and Foot Adjustments) w/ any type side  rail  Dx: 344.04, 596.54, 707.03, 707.05, 788.33    . Non-System Medication French Foley Catheters - Silicone  Diagnosis 596.54, 344.04  Fax to [facility]    . Skin Protectants, Misc. OINT Apply 1 Tube topically 2 times daily      . sertraline (ZOLOFT) 50 MG tablet Take 1 tablet (50 mg total) by mouth daily      . Non-System Medication One Hoyer Lift  Diagnosis:  Quadriplegic ICD-9 344.04    . disposable gloves 1 box Disposable Medium size gloves    . docusate sodium (COLACE) 100 MG capsule Take 1 capsule (100 mg total) by mouth 2 times daily      No current facility-administered medications for this visit.       Medication list reconciled this visit    Allergies:     Allergies   Allergen Reactions   . Heparin      Thrombocytopenia;    . Nitrofurantoin      Vomiting   . Vancomycin Hives     hives 2006 but tolerated Rx in 2010       Social history      History   Substance Use Topics   . Smoking status: Former Smoker -- 0.50 packs/day for 3 years     Types: Cigarettes     Start date: 12/09/2011  Quit date: 05/25/2012   . Smokeless tobacco: Former Neurosurgeon     Quit date: 04/29/2005   . Alcohol Use: No       Review of Systems     Review of Systems   Constitutional: Negative for fever and chills.   Respiratory: Negative for cough, shortness of breath and wheezing.    Cardiovascular: Negative for chest pain, palpitations and leg swelling.   Gastrointestinal: Negative for heartburn, nausea and abdominal pain.   Genitourinary:        Indwelling catheter   Skin:        decubi managed by wound care center         Physical Exam:     Patient is alert and oriented and in no distress.  sitting a motorized wheelchair  Cardiovascular: Normal rate, regular rhythm, S1 normal and S2 normal, no edema.     Pulmonary/Chest: Lungs are clear   Abdominal: soft, non-tender, bowel sounds are normal,   There is no hepatosplenomegaly.   Skin: Keloids noted neck and chest      Filed Vitals:    07/11/12 0958   BP: 120/90   Pulse: 74   Temp:  35.1 C (95.2 F)   TempSrc: Temporal   Height: 1.651 m (5\' 5" )   Weight: 86.183 kg (190 lb)     Wt Readings from Last 3 Encounters:   07/11/12 86.183 kg (190 lb)   05/17/12 86.183 kg (190 lb)   05/06/12 85.73 kg (189 lb)     BP Readings from Last 3 Encounters:   07/11/12 120/90   07/08/12 96/53   05/30/12 121/87           RESULTS:         Component Value Date/Time    NA 138 05/20/2012 0417    K 3.4 05/20/2012 0417    CREAT 0.48* 05/20/2012 0417    GLU 99 05/20/2012 0417    CA 8.6* 05/20/2012 0417     Lab Results   Component Value Date    ALT 16 05/17/2012    AST 20 05/17/2012    GGT 35 11/19/2009        Lab Results   Component Value Date    TSH 0.67 09/01/2011     Lab Results   Component Value Date    WBC 5.5 05/22/2012    HGB 12.4 05/22/2012    HCT 38 05/22/2012    MCV 92 05/22/2012    PLT 72* 05/22/2012     Lab Results   Component Value Date    CREAT 0.48* 05/20/2012           ASSESSMENT/PLAN:     Quadriplegic C5-C7 incomplete   Patient will continue to need nursing in a service     Face-to-face form completed         Keloid  - AMB REFERRAL TO PLASTIC SURGERY    Other Orders  - docusate sodium (COLACE) 100 MG capsule; Take 1 capsule (100 mg total) by mouth 2 times daily        Patient instructions      Follow up with PCP in one month            Karen Kitchens,  Nurse Practitioner  [facility]

## 2012-07-14 ENCOUNTER — Ambulatory Visit: Payer: Self-pay | Admitting: Urology

## 2012-07-14 ENCOUNTER — Encounter: Payer: Self-pay | Admitting: Urology

## 2012-07-14 VITALS — BP 102/60 | HR 104 | Resp 16 | Wt 190.0 lb

## 2012-07-14 NOTE — Progress Notes (Signed)
This office note has been dictated.

## 2012-07-15 NOTE — Letter (Signed)
PATIENT NAME:  Cindy, Ochoa   DATE OF SERVICE:  07/14/2012  MRN:  [id number]  Page 2  [address] [place].[address]  Phone.[phone].[phone]     July 14, 2012    Andrey Campanile, MD   [address]   [place], [place] [address]    RE:   Cindy, [name]   DOB:  [birth year]  Unit#:  [id number]    Dear Dr. Daphine Deutscher:     This is just a brief followup on your patient, Cindy Ochoa, who as you know, is a C5 quadriplegic with a neurogenic bladder and problems with draining around her catheter and having bladder spasms.  Indeed, she came to our office today with a catheter balloon blown up to 20 mL and had actually come through her urethral meatus and she was having autonomic dysreflexia.  We passed a new catheter back in and things seem to be better.  Cindy Ochoa, as you remember, was cystoscoped and she did not have a major abnormality in her bladder but had an exceptionally short urethra and I did not think there was much of the way that one could correct her severe incontinence or even narrow the urethra so that she could hold a catheter.  The issue is what to do with her urinary tract now.  I spent considerable time speaking with Dr. Stacie Acres about this, who deals with neurogenic bladder problems and he felt that really the only likely solution to her problem was going to be an ileal loop urinary diversion.  She is maxed out on medications without any benefit and he did not feel that an augmentation cystoplasty, which the bladder would be enlarged, is likely to be beneficial if her urethra is too short as it seemed to be endoscopically.  Obviously, an ileal loop, which is what she would have is not something she can take care of by herself, but of course, she cannot manage her catheter or other problems alone right now.  There are potential problems with an ileal loop, including compromising her upper tracts which have not yet occurred with her infections, etc. and certainly with the refluxing  system that is created could result in upper tract compromise.  Blockages and leakages to her GI and urinary tract could also occur and we have explained to her that this is quite major surgery and things like bleeding, infection, DVTs, pulmonary emboli, cardiac events, pneumonia, etc.  all could ensue.  On the other hand, her current situation is virtually unmanageable.  She understands this and actually would prefer having surgery.  She saw a movie on the ileal loop today, which hopefully answered many questions for her.     At this stage, I am hoping not to have to do a cystectomy and would prefer and feel that it is likely that her urethral opening is large enough that she would be unlikely to develop pyocystis in her functionalized bladder.  This will certainly shorten the operation for her.  If need be, there is a procedure called Spence procedure in which a urethral and vesicle vaginal fistula is actually created which could be done either prophylactically or subsequently if she developed pyocystic episode.     At this stage, we are going to tentatively schedule her surgery sometime in the next several weeks.  She understands. She will be in the hospital for 5-7 days afterwards.       Again, thank you very much for allowing [place]  to be involved in the care of your patient.  We will, of course, keep you informed of all urological follow-up.           Deitra Mayo, MD    EMM/MODL  DD:  07/14/2012 18:17:55  DT:  07/15/2012 00:46:43  Job #:  [id number]    cc: Andrey Campanile, MD   [address]   [address]   [place], [place] [address]     Willeen Niece, MD   [address]   [place], [place] [address]     Kennith Gain, NP   [address]   [address]   [place], [place] [address]

## 2012-07-16 LAB — AEROBIC CULTURE

## 2012-07-21 ENCOUNTER — Ambulatory Visit
Admit: 2012-07-21 | Discharge: 2012-07-21 | Disposition: A | Discharge status: Home / Self Care | Payer: Self-pay | Source: Ambulatory Visit | Attending: Urology | Admitting: Urology

## 2012-07-21 ENCOUNTER — Encounter: Payer: Self-pay | Admitting: Gastroenterology

## 2012-08-05 ENCOUNTER — Ambulatory Visit: Payer: Self-pay | Admitting: Bariatric Surgery

## 2012-08-08 ENCOUNTER — Other Ambulatory Visit: Payer: Self-pay | Admitting: Physical Medicine and Rehabilitation

## 2012-08-08 DIAGNOSIS — N390 Urinary tract infection, site not specified: Secondary | ICD-10-CM

## 2012-08-10 ENCOUNTER — Ambulatory Visit: Payer: Self-pay | Admitting: Bariatric Surgery

## 2012-08-10 ENCOUNTER — Encounter: Payer: Self-pay | Admitting: Bariatric Surgery

## 2012-08-10 VITALS — BP 101/65 | HR 85 | Temp 96.1°F | Resp 18
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
## 2012-08-15 ENCOUNTER — Other Ambulatory Visit: Payer: Self-pay | Admitting: Gastroenterology

## 2012-08-15 ENCOUNTER — Ambulatory Visit
Admit: 2012-08-15 | Discharge: 2012-08-15 | Disposition: A | Discharge status: Home / Self Care | Payer: Self-pay | Source: Ambulatory Visit | Attending: Urology | Admitting: Urology

## 2012-08-15 ENCOUNTER — Encounter: Payer: Self-pay | Admitting: Urology

## 2012-08-15 LAB — MRSA (ORSA) AMPLIFICATION: MRSA (ORSA) Amplification: 0

## 2012-08-15 NOTE — Discharge Instructions (Signed)
[facility]     NAME: Cindy Ochoa    SURGERY DATE: Friday August 30     1. On (Thursday August 29) CALL [phone] between 2:30 PM and 7 PM to find out: a): the time to arrive at the [facility] and b) the time of your procedure.    Note: Patients scheduled for a procedure on Monday should call the Friday before.    Please note surgery start time is approximate. You may want to bring something to help pass the time. PLEASE ARRIVE ON TIME.         2. DIRECTIONS TO [facility]: On the day of your procedure  park in the parking garage and take the elevator/stairs to Level One (1), then follow the walkway to the Main Lobby.  Walk past the Information Desk in the lobby, towards the Lab & Outpatient Services.  Follow the GREEN (G) ceiling tags to the GREEN elevators. (Valet parking is available outside the front entrance of the hospital between 6:00 AM and 5:00 PM.)    [facility] (B-Level): take the GREEN elevators to the Basement (Level B - Two floors down).  Follow the [facility] on the wall to the [facility] and check in with the receptionist at the desk.       3. Before coming to the hospital, remove all makeup, (including mascara), jewelry (including wedding band and watch), hair accessories and nail polish from toes and fingers. Do not bring any valuables (money, wallet, purse, jewelry, or contact lenses).   Bring Photo ID        4. Call your surgeon if you become ill before the procedure day, i.e. fever, chills, nausea, vomiting, sore throat.       5. EATING GUIDELINES: Follow the instructions below unless directed by your surgeon. Starting:   Thursday August 29      Clear liquids after lunch the day before surgery   Nothing to eat after midnight (No candy, mints, gum, or water) on the day of your surgery.   May have clear liquids up until 4 hrs before surgery.   Failure to follow these instructions could lead to a delay or cancellation  of your procedure.       6. MEDICATIONS:    On the morning of surgery, take only the medications listed below at the usual time or before leaving for the  hospital: none  Medications should only be taken with no more than one ounce of water.  You may take Tylenol (Acetaminophen) if needed.    Administer a fleets enema the evening before surgery.      Aspirin: Do not take any aspirin products for 10 days before the procedure date.  Anti-Inflammatory products: Do not take any non-steroidal anti-inflammatory agents such as Ibuprofen (Advil, Motrin) or Naproxen (Aleve) 10 days before the procedure.       7.  EXPECTED STAY:    (SDA) Admission: You are being admitted to the hospital after surgery for approximately *** days.  Leave any luggage in your car until after your procedure.  Then your family can bring into the hospital once you are in your room.    Health standards require that a responsible adult must accompany any patient who has received anesthetics or sedation and is going home the same day.     You must arrange a ride home before coming to surgery.         8. Your family  will be directed to a waiting area when you are taken to surgery.    We ask that only one or two family members accompany you on the day of your procedure.           No children under the age of 87 are allowed as visitors in [facility]    Your family will be notified when your surgery is completed and you have arrived on the patient care unit.        9. If you have any questions, please contact the [facility] during regular business hours at [phone], 8:00 AM to 4:00 PM, Monday to Friday.        Karen Kitchens FNP    Surgical Site Infections FAQs    What is a Surgical Site infection (SSI)?  A surgical site infection is an infection that occurs after surgery In the part of the body where the surgery took place. Most patients who have surgery do not develop an infection. However, Infections develop in  about 1 to 3 out of every 100 patients who have surgery.     Some of the common symptoms of a surgical site infection are:   Marland Kitchen Redness and pain around the area where you had surgery   . Drainage of cloudy fluid from your surgical wound   . Fever     Can SSIs be treated?   Yes. Most surgical site infections can be treated with antibiotics. The antibiotic given to you depends on the bacteria (germs) causing the Infection. Sometimes patients with SSIs also need another surgery to treat the infection.     What are some of the things that hospitals are doing to prevent SSls?   To prevent SSIs, doctors, nurses, and other healthcare providers:   . Clean their hands and arms up to their elbows with an antiseptic agent just before the surgery.   . Clean their hands with soap and water or an alcohol-based hand rub before and after caring for each patient.   . May remove some of your hair Immediately before your surgery using electric clippers If the hair Is in the same area where the procedure will occur. They should not shave you with a razor.   . Wear special hair covers, masks, gowns, and gloves during surgery to keep the surgery area clean.   . Give you antibiotics before your surgery starts. in most cases, you should get antibiotics within 60 mInutes before the surgery starts and the antibiotics should be stopped within 24 hours after surgery.   . Clean the skin at the site of your surgery with a special soap that kills germs.     What can I do to help prevent SSIs?   Before your surgery:   . Tell your doctor about other medical problems you may have. Health problems such as allergies, diabetes, and obesity could affect your surgery and your treatment.   . Quit smoking. Patients who smoke get more Infections. Talk to your doctor about how you can quit before your surgery.   . Do not shave near where you will have surgery. Shaving with a razor can Irritate your skin and make it easier to develop an infection.   At the  time of your surgery:   . Speak up if someone tries to shave you with a razor before surgery. Ask why you need to be shaved and talk with your surgeon if you have any concerns.   Marland Kitchen  Ask if you will get antibiotics before surgery.   After your surgery:   . Make sure that your healthcare providers clean their hands before examining you, either with soap and water or an alcohol-based hand rub,      If you do not see your healthcare providers wash their hands,   please ask them to do so.    . Family and friends who visit you should not touch the surgical wound or dressings.   . Family and friends should clean their hands with soap and water or an alcohol-based hand rub before and after visiting you. If you do not see them clean their hands, ask them to clean their hands.   What do I need to do when I go home from the hospital?   . Before you go home, your doctor or nurse should explain everyt hing you need to know about taking care of your wound. Make sure you understand how to care for your wound before you leave the hospital.   . Always clean your hands before and after caring for your wound.   . Before you go home, make sure you know who to contact If you have questions or problems after you get home.   . If you have any symptoms of an Infection, such as redness and pain at the surgery site, drainage, or fever, call your doctor immediately.   if you have additional questions, Please ask your doctor or nurse.

## 2012-08-16 ENCOUNTER — Telehealth: Payer: Self-pay | Admitting: Bariatric Surgery

## 2012-08-16 NOTE — Telephone Encounter (Signed)
Called [employer] to confirm shipment as may have gone to wrong address ([address]  [facility] confirms this address again. Will verify current address of [address] [facility] and request item there till further notice.  Address label was applied on order form and circled however notice was not taken.

## 2012-08-17 LAB — EKG 12-LEAD
QRS: 61 degrees
Rate: 90 {beats}/min
T: -14 degrees

## 2012-08-22 ENCOUNTER — Encounter: Payer: Self-pay | Admitting: Physical Medicine and Rehabilitation

## 2012-08-22 ENCOUNTER — Other Ambulatory Visit: Payer: Self-pay | Admitting: Urology

## 2012-08-22 ENCOUNTER — Ambulatory Visit: Payer: Self-pay | Admitting: Physical Medicine and Rehabilitation

## 2012-08-22 ENCOUNTER — Other Ambulatory Visit: Payer: Self-pay | Admitting: Internal Medicine

## 2012-08-22 ENCOUNTER — Encounter: Payer: Self-pay | Admitting: Surgery

## 2012-08-22 VITALS — BP 109/56 | HR 102 | Temp 97.2°F | Ht 64.0 in | Wt 190.0 lb

## 2012-08-22 DIAGNOSIS — R32 Unspecified urinary incontinence: Secondary | ICD-10-CM

## 2012-08-22 DIAGNOSIS — Z01818 Encounter for other preprocedural examination: Secondary | ICD-10-CM

## 2012-08-22 DIAGNOSIS — N390 Urinary tract infection, site not specified: Secondary | ICD-10-CM

## 2012-08-22 DIAGNOSIS — G8254 Quadriplegia, C5-C7 incomplete: Secondary | ICD-10-CM

## 2012-08-22 DIAGNOSIS — G8253 Quadriplegia, C5-C7 complete: Secondary | ICD-10-CM

## 2012-08-22 LAB — COMPREHENSIVE METABOLIC PANEL
ALT: 15 U/L (ref 0–35)
AST: 32 U/L (ref 0–35)
Albumin: 4.4 g/dL (ref 3.5–5.2)
Alk Phos: 62 U/L (ref 35–105)
Anion Gap: 15 (ref 7–16)
Bilirubin,Total: 0.4 mg/dL (ref 0.0–1.2)
CO2: 19 mmol/L — ABNORMAL LOW (ref 20–28)
Calcium: 9.4 mg/dL (ref 8.8–10.2)
Chloride: 105 mmol/L (ref 96–108)
Creatinine: 0.45 mg/dL — ABNORMAL LOW (ref 0.51–0.95)
GFR,Black: 159 *
GFR,Caucasian: 138 *
Glucose: 87 mg/dL (ref 60–99)
Lab: 9 mg/dL (ref 6–20)
Potassium: 3.6 mmol/L (ref 3.3–5.1)
Sodium: 139 mmol/L (ref 133–145)
Total Protein: 7.8 g/dL — ABNORMAL HIGH (ref 6.3–7.7)

## 2012-08-22 LAB — PREGNANCY TEST, SERUM: Preg,Serum: NEGATIVE

## 2012-08-22 LAB — CBC
Hematocrit: 44 % (ref 34–45)
Hemoglobin: 14.4 g/dL (ref 11.2–15.7)
MCV: 93 fL (ref 79–95)
Platelets: 77 10*3/uL — ABNORMAL LOW (ref 160–370)
RBC: 4.8 MIL/uL (ref 3.9–5.2)
RDW: 15.7 % — ABNORMAL HIGH (ref 11.7–14.4)
WBC: 8.9 10*3/uL (ref 4.0–10.0)

## 2012-08-22 LAB — APTT: aPTT: 37.8 s (ref 25.8–37.9)

## 2012-08-22 LAB — TYPE AND SCREEN
ABO RH Blood Type: O POS
Antibody Screen: NEGATIVE

## 2012-08-22 LAB — PROTIME-INR
INR: 1.2 (ref 1.0–1.2)
Protime: 12.3 s (ref 9.2–12.3)

## 2012-08-22 LAB — MULTIPLE ORDERING DOCS

## 2012-08-22 MED ORDER — NON-SYSTEM MEDICATION *A*
Status: DC
Start: 2012-08-22 — End: 2012-09-01

## 2012-08-22 NOTE — Progress Notes (Signed)
Pt seen this AM at request of Lanier Ensign, NP for optimal stoma site marking & to answer any questions re: ileal conduit surgery scheduled for 8/30.  Pt resting quietly on stretcher.  She is a quadriplegic with limited use of arms - right arm contracted at elbow, some use of left hand (can hold cup, etc).  Mother with pt.  Pt tearful - had seen Dr. Lisabeth Pick several weeks ago for evaluation of severe urinary incontinence.  Pt had had a foley catheter for many years.  Chronic use of catheter caused dilation of urethra.  She had surgical repair to contract urethra but this did not hold. Foley catheters are expelled - even with inflated balloons.  As per staff, pt now has pressure ulcers that will not heal as she is constantly wet from urine leakage.  Ileal conduit has been offered to pt - surgery scheduled for 8/30.  Pt received information re: this surgery as well as a urostomy appliance.  She did not see the educational film at that time "because I panicked".  Today, pt is tearful.  Gently questioned pt re: her readiness for this surgery.  She stated that she felt strong & is tired of being wet all the time but is concerned about the surgery.  Support & reassurance given and pt stated clearly that she wanted to go ahead with the surgery - "I am ready".  Encouraged pt/ her mother to ask questions.  Optimal stoma site marked in right lower abdominal quadrant - below umbilicus, within pt's view.  Abdomen is rotund - unable to palpate rectus muscle.  Pt raised to sitting position on stretcher - was able to view site - not skin folds or creases seen.  Pt will see educational video at today's visit.  Will follow as requested for post-operative care.  Christa M. Heinsler, RN/NP-CWOCN

## 2012-08-22 NOTE — Progress Notes (Signed)
Patient seen in the urology clinic for a preop testing.  She scheduled for an ileal loop diversion with Dr. Lisabeth Pick on 8/30.  When she was seen for PRAT, they were unable to obtain a urine culture or obtain her blood work.  Today, a new Foley catheter was placed in the urine for culture and sensitivity was obtained for yellow urine with mucus threads in it.  She was also seen by Dr. Lisabeth Pick, who described the procedure to the patient with her mother present.  He discussed possible complications including but not limited to continued urine leakage, leakage or perforation of bowel, infection, pneumonia, DVT, pulmonary embolus, death.  Patient is anxious  and now feels ready to proceed with the surgery.  Patient has had all of her questions answered by Dr. Lisabeth Pick and the wound ostomy nurse.  Waymond Cera NP we'll follow results of her urine culture and sensitivity and treat with appropriate antibiotics.

## 2012-08-23 LAB — AEROBIC CULTURE

## 2012-08-23 NOTE — Telephone Encounter (Signed)
Faxed and mailed to [facility].

## 2012-08-24 ENCOUNTER — Telehealth: Payer: Self-pay | Admitting: Urology

## 2012-08-24 MED ORDER — LEVOFLOXACIN 500 MG PO TABS *I*
500.0000 mg | ORAL_TABLET | Freq: Every day | ORAL | Status: DC
Start: 2012-08-24 — End: 2012-09-01

## 2012-08-24 NOTE — Telephone Encounter (Signed)
Spoke with the pt's mother regarding antibiotic for the pt to take prior to her surgery scheduled for Friday.  Called in a dose of Levaquin 500 mg for her to take tomorrow. They are unable to get any medication today.

## 2012-08-26 ENCOUNTER — Ambulatory Visit: Payer: Self-pay | Admitting: Urology

## 2012-08-26 ENCOUNTER — Inpatient Hospital Stay
Admit: 2012-08-26 | Disposition: A | Discharge status: Home Health | Payer: Self-pay | Source: Ambulatory Visit | Attending: Urology | Admitting: Urology

## 2012-08-26 HISTORY — PX: OTHER SURGICAL HISTORY: SHX169

## 2012-08-26 LAB — BLOOD GASES AND WHOLE BLD ANALYTES ART
Anion Gap,WB: 13 (ref 7–16)
Base Excess, Arterial: -2 mmol/L (ref <=2)
CO2,ART (Calc): 24 mmol/L (ref 21–28)
CO: 0.7 %
Chloride,WB: 109 mmol/L — ABNORMAL HIGH (ref 98–108)
FO2 Hb, Arterial: 98 % — ABNORMAL HIGH (ref 90–95)
Glucose,WB: 87 mg/dL (ref 60–99)
HCO3, Arterial: 23 mmol/L (ref 19–23)
HCT (Calc): 37 % (ref 34–45)
Hemoglobin: 12.5 g/dL (ref 11.2–15.7)
ICA @7.4,WB: 4.2 mg/dL — ABNORMAL LOW (ref 4.8–5.2)
ICA Uncorr,WB: 4.2 mg/dL
Lactate ART,WB: 1.1 mmol/L — ABNORMAL HIGH (ref 0.3–0.8)
Methemoglobin: 0.5 % (ref 0.0–1.0)
NA, WB: 141 mmol/L (ref 135–145)
Potassium,WB: 2.9 mmol/L — CL (ref 3.4–4.7)
pCO2, Arterial: 38 mm Hg (ref 33–43)
pH: 7.4 (ref 7.36–7.44)
pO2,Arterial: 204 mm Hg — ABNORMAL HIGH (ref 80–100)

## 2012-08-26 LAB — AEROBIC CULTURE

## 2012-08-26 LAB — RED BLOOD CELLS

## 2012-08-26 LAB — PROTIME-INR
INR: 1.2 (ref 1.0–1.2)
Protime: 12.8 s — ABNORMAL HIGH (ref 9.2–12.3)

## 2012-08-26 LAB — PREGNANCY TEST, SERUM: Preg,Serum: NEGATIVE

## 2012-08-26 LAB — CBC
Hematocrit: 39 % (ref 34–45)
Hematocrit: 32 % — ABNORMAL LOW (ref 34–45)
Hemoglobin: 13.1 g/dL (ref 11.2–15.7)
Hemoglobin: 10.5 g/dL — ABNORMAL LOW (ref 11.2–15.7)
MCV: 90 fL (ref 79–95)
MCV: 92 fL (ref 79–95)
Platelets: 76 10*3/uL — ABNORMAL LOW (ref 160–370)
Platelets: 92 10*3/uL — ABNORMAL LOW (ref 160–370)
RBC: 4.3 MIL/uL (ref 3.9–5.2)
RBC: 3.5 MIL/uL — ABNORMAL LOW (ref 3.9–5.2)
RDW: 15.7 % — ABNORMAL HIGH (ref 11.7–14.4)
RDW: 15.8 % — ABNORMAL HIGH (ref 11.7–14.4)
WBC: 7.7 10*3/uL (ref 4.0–10.0)
WBC: 7 10*3/uL (ref 4.0–10.0)

## 2012-08-26 LAB — MRSA (ORSA) AMPLIFICATION: MRSA (ORSA) Amplification: 0

## 2012-08-26 LAB — GRAM STAIN: Gram Stain: 0

## 2012-08-26 LAB — APTT: aPTT: 35.7 s (ref 25.8–37.9)

## 2012-08-26 MED ORDER — FONDAPARINUX SODIUM 2.5 MG/0.5ML SC SOLN *I*
2.5000 mg | Freq: Every day | SUBCUTANEOUS | Status: AC
Start: 2012-08-26 — End: 2012-08-26
  Administered 2012-08-26: 2.5 mg via SUBCUTANEOUS
  Filled 2012-08-26: qty 0.5

## 2012-08-26 MED ORDER — VANCOMYCIN HCL IN DEXTROSE 5 MG/ML IV SOLN *I*
1000.0000 mg | Freq: Once | INTRAVENOUS | Status: AC
Start: 2012-08-26 — End: 2012-08-26

## 2012-08-26 MED ORDER — ONDANSETRON HCL 2 MG/ML IV SOLN *I*
1.0000 mg | Freq: Once | INTRAMUSCULAR | Status: AC | PRN
Start: 2012-08-26 — End: 2012-08-26
  Administered 2012-08-26: 1 mg via INTRAVENOUS

## 2012-08-26 MED ORDER — SODIUM CHLORIDE 0.9 % IV SOLN WRAPPED *I*
20.0000 mL/h | Status: DC
Start: 2012-08-26 — End: 2012-08-27

## 2012-08-26 MED ORDER — FONDAPARINUX SODIUM 2.5 MG/0.5ML SC SOLN *I*
2.5000 mg | Freq: Every day | SUBCUTANEOUS | Status: DC
Start: 2012-08-27 — End: 2012-08-26

## 2012-08-26 MED ORDER — SODIUM CHLORIDE 0.9 % IV SOLN WRAPPED *I*
125.0000 mL/h | Status: DC
Start: 2012-08-26 — End: 2012-08-28
  Administered 2012-08-26 – 2012-08-28 (×5): 125 mL/h via INTRAVENOUS

## 2012-08-26 MED ORDER — LIDOCAINE HCL 1 % IJ SOLN
0.1000 mL | INTRAMUSCULAR | Status: DC | PRN
Start: 2012-08-26 — End: 2012-08-27

## 2012-08-26 MED ORDER — ONDANSETRON HCL 2 MG/ML IV SOLN *I*
INTRAMUSCULAR | Status: AC
Start: 2012-08-26 — End: 2012-08-26
  Filled 2012-08-26: qty 2

## 2012-08-26 MED ORDER — LEVOFLOXACIN 500 MG PO TABS *I*
500.0000 mg | ORAL_TABLET | Freq: Every day | ORAL | Status: DC
Start: 2012-08-27 — End: 2012-08-27

## 2012-08-26 MED ORDER — SODIUM CHLORIDE 0.9 % IV SOLN WRAPPED *I*
500.0000 mg | Freq: Four times a day (QID) | INTRAVENOUS | Status: DC
Start: 2012-08-26 — End: 2012-08-26
  Administered 2012-08-26: 500 mg via INTRAVENOUS
  Filled 2012-08-26 (×4): qty 500

## 2012-08-26 MED ORDER — SODIUM CHLORIDE 0.9 % IV SOLN WRAPPED *I*
500.0000 mg | Freq: Four times a day (QID) | INTRAVENOUS | Status: DC
Start: 2012-08-26 — End: 2012-08-26
  Administered 2012-08-26: 500 mg via INTRAVENOUS
  Filled 2012-08-26 (×2): qty 500

## 2012-08-26 MED ORDER — ONDANSETRON HCL 2 MG/ML IV SOLN *I*
4.0000 mg | Freq: Four times a day (QID) | INTRAMUSCULAR | Status: DC | PRN
Start: 2012-08-26 — End: 2012-09-01

## 2012-08-26 MED ORDER — LACTATED RINGERS IV SOLN *I*
20.0000 mL/h | INTRAVENOUS | Status: DC
Start: 2012-08-26 — End: 2012-08-27
  Administered 2012-08-26: 20 mL/h via INTRAVENOUS

## 2012-08-26 MED ORDER — MEPERIDINE HCL 25 MG/ML IJ SOLN *I*
12.5000 mg | INTRAMUSCULAR | Status: DC | PRN
Start: 2012-08-26 — End: 2012-08-27
  Administered 2012-08-26: 12.5 mg via INTRAVENOUS

## 2012-08-26 MED ORDER — SODIUM CHLORIDE 0.9 % IV SOLN WRAPPED *I*
500.0000 mg | Freq: Four times a day (QID) | INTRAVENOUS | Status: AC
Start: 2012-08-26 — End: 2012-08-27
  Administered 2012-08-26 – 2012-08-27 (×2): 500 mg via INTRAVENOUS
  Filled 2012-08-26 (×2): qty 500

## 2012-08-26 MED ORDER — MEPERIDINE HCL 25 MG/ML IJ SOLN *I*
INTRAMUSCULAR | Status: AC
Start: 2012-08-26 — End: 2012-08-26
  Filled 2012-08-26: qty 1

## 2012-08-26 MED ORDER — PROMETHAZINE HCL 25 MG/ML IJ SOLN *I*
INTRAMUSCULAR | Status: AC
Start: 2012-08-26 — End: 2012-08-26
  Filled 2012-08-26: qty 1

## 2012-08-26 MED ORDER — HYDROMORPHONE HCL PF 1 MG/ML IJ SOLN *WRAPPED*
0.4000 mg | INTRAMUSCULAR | Status: DC | PRN
Start: 2012-08-26 — End: 2012-08-27

## 2012-08-26 NOTE — Procedures (Signed)
ARTERIAL LINE PROCEDURE NOTE    Time out documentation completed in Procedure navigator prior to procedure:  No (because in OR)    Indications  Intraoperative hemodynamic monitoring    Procedure Details  Successful placement: Yes  Side: left  Arterial Site: radial artery  Attempted Sites: Right radial artery and Left radial artery      1% lidocaine S.Q. N/A  Artery cannulated with 20G catheter  Technique: with wire guide  Correct placement confirmed by: transducer  Secured by: bioocclusive dressing    Findings  Good arterial wave form    Complications  none    Condition  Patient tolerated procedure well. Yes  Patient's condition at end of procedure: no change from prior    Plan/Orders  Proceed with surgery    EBL  3 mL    Disposition  OR    Attempted by Vale Haven, SRNA under my direct supervision on right without success.  Personally performed by me on left.  I was present throughout.    Ottie Glazier, MD  08/26/2012  1:59 PM

## 2012-08-26 NOTE — Anesthesia Post-procedure Eval (Signed)
Anesthesia Post-op Note    Patient: [facility]    Anesthesia type: General    Patient location: PACU    Mental Status: Recovered to baseline    Patient able to participate in this evaluation: yes    Post-op vital signs: stable    Post-op vital signs noted above are within patient's normal range    Respiratory function: baseline    Airway patent: Yes    Cardiovascular and hydration status stable: Yes    Post-Op pain: Adequate analgesia    Post-Op assessment: no apparent anesthetic complications    Complications: none    Attending Attestation: All indicated post anesthesia care provided    Author: Merry Proud, MD as of: 08/26/2012  at: 9:33 PM
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
## 2012-08-26 NOTE — INTERIM OP NOTE (Signed)
Interim Op Note    Date of Surgery: 08/26/12  Surgeon: Thomes Dinning MD  First Assistant: Jeoffrey Massed MD PhD    Pre-Op Diagnosis: neurogenic bladder    Anesthesia Type: general LMA    Post-Op Diagnosis:    Primary: same  Procedure(s) Performed (including CPT 4 Code if available)  Ileal conduit     Estimated Blood Loss: 1 liter   Packing: No  Drains:  Foley catheter, Jp drain left lower quadrant, Jp drain right lower quadrant, two stent in stoma draining the conduit   Fluid Totals: Intakes: as per anesthesia record Outputs: as per anesthesia record  Specimens to Pathology: yes  Patient Condition: good

## 2012-08-26 NOTE — H&P (Signed)
UPDATES TO PATIENT'S CONDITION on the DAY OF SURGERY/PROCEDURE    I. Updates to Patient's Condition (to be completed by a provider privileged to complete a H&P, following reassessment of the patient by the provider):    Day of Surgery/Procedure Update:  History  History reviewed and no change    Physical  Physical exam updated and no change              II. Procedure Readiness   I have reviewed the patient's H&P and updated condition. By completing and signing this form, I attest that this patient is ready for surgery/procedure.      III. Attestation   I have reviewed the updated information regarding the patient's condition and it is appropriate to proceed with the planned surgery/procedure.    Deitra Mayo, MD as of 7:31 AM 08/26/2012

## 2012-08-26 NOTE — Progress Notes (Signed)
Chest x ray done and read by Dr. Leanora Ivanoff.  No pneumo noted.

## 2012-08-26 NOTE — Discharge Summary (Signed)
Discharge Summary     Admit date: 08/26/2012         Discharge date and time: 09/01/2012  Admitting Physician: Deitra Mayo, MD   Discharge Physician: Deitra Mayo, MD    Patient: Cindy Ochoa Age: 26 y.o. Date of Birth: 02/18/1986 [id number]  Admit Weight: weight: 86.5 kg (190 lb 11.2 oz)  Current/Discharge Weight: weight: 86.5 kg (190 lb 11.2 oz)     Admission Diagnoses: C5 quadriplegic with a neurogenic bladder and problems with draining around her catheter    Discharge Diagnoses: s/p Ileal loop urinary diversion     The patient has a history of C5 quadriplegic with a neurogenic bladder and problems with draining around her catheter. Therefore she was admitted on 09/01/2012 and underwent an ileal loop urinary diversion. She tolerated the procedure well and was transferred to the floor in stable condition. Eventually she was tolerating a regular diet, pain was well controlled and she was voiding well through her new ileal loop. She was noted to have some vaginal drainage and was started on Flagyl and Monistat, she also received one dose of Diflucan to treat.    She was discharged home on 09/01/2012 with instructions to follow up with Dr Lisabeth Pick in 7-10 days.        Physical Exam  Patient Vitals for the past 24 hrs:   BP Temp Temp src Pulse Resp SpO2   09/01/12 0725 119/75 mmHg 36.8 C (98.2 F) TEMPORAL 84  18  96 %   09/01/12 0144 120/70 mmHg 36.6 C (97.9 F) TEMPORAL 64  18  96 %   08/31/12 2130 128/79 mmHg 36.4 C (97.5 F) TEMPORAL 81  18  97 %   08/31/12 1756 133/87 mmHg 36.4 C (97.5 F) TEMPORAL 80  18  99 %   08/31/12 1334 151/98 mmHg 36.5 C (97.7 F) TEMPORAL 69  18  99 %     Last Nursing documented pain:  0-10 Scale: 0 (09/01/12 0725)      Hospital Problem List:  Active Hospital Problems    Diagnosis   . Neurogenic bladder      Resolved Hospital Problems    Diagnosis   No resolved problems to display.       Labs:   All labs in the last 24 hours   Recent Results (from the past 24 hour(s))   CBC AND  DIFFERENTIAL    Collection Time    09/01/12  1:56 AM       Result Value Range    WBC 5.7  4.0 - 10.0 THOU/uL    RBC 3.1 (*) 3.9 - 5.2 MIL/uL    Hemoglobin 9.3 (*) 11.2 - 15.7 g/dL    Hematocrit 28 (*) 34 - 45 %    MCV 93  79 - 95 fL    RDW 15.2 (*) 11.7 - 14.4 %    Platelets 89 (*) 160 - 370 THOU/uL    Seg Neut % 44.7  34.0 - 71.1 %    Lymphocyte % 43.9  19.3 - 51.7 %    Monocyte % 7.7  4.7 - 12.5 %    Eosinophil % 3.3  0.7 - 5.8 %    Basophil % 0.4  0.1 - 1.2 %    Neut # K/uL 2.6  1.6 - 6.1 THOU/uL    Lymph # K/uL 2.5  1.2 - 3.7 THOU/uL    Mono # K/uL 0.4  0.2 - 0.9 THOU/uL    Eos # K/uL  0.2  0.0 - 0.4 THOU/uL    Baso # K/uL 0.0  0.0 - 0.1 THOU/uL   BASIC METABOLIC PANEL    Collection Time    09/01/12  1:56 AM       Result Value Range    Glucose 78  60 - 99 mg/dL    Sodium 161  096 - 045 mmol/L    Potassium 4.0  3.3 - 5.1 mmol/L    Chloride 106  96 - 108 mmol/L    CO2 25  20 - 28 mmol/L    Anion Gap 8  7 - 16    UN 5 (*) 6 - 20 mg/dL    Creatinine 4.09 (*) 0.51 - 0.95 mg/dL    GFR,Caucasian 811      GFR,Black 153      Calcium 8.4 (*) 8.8 - 10.2 mg/dL   MAGNESIUM    Collection Time    09/01/12  1:56 AM       Result Value Range    Magnesium 1.6  1.3 - 2.1 mEq/L   PHOSPHORUS    Collection Time    09/01/12  1:56 AM       Result Value Range    Phosphorus 4.3  2.7 - 4.5 mg/dL     UA / UC    Blood Culture x2   Aerobic Culture   Date Value Range Status   08/26/2012 .   Final   08/22/2012 Lab Cancel   Final        Bacterial Blood Culture   Date Value Range Status   05/18/2012 .   Final   05/17/2012 .   Final     Procedures/ Therapy/ Surgery: s/p ileal loop diversion  Continuing IV and other therapy: Ileal loop urinary diversion  Admission Condition: fair  Discharged Condition: good    Disposition:  Home with home care services      Signed: Rhoderick Moody, NP  On: 09/01/2012  at: 9:41 AM

## 2012-08-26 NOTE — Procedures (Signed)
CENTRAL LINE INSERTION NOTE     Time out documentation completed in Procedure navigator prior to procedure:  No (because in OR)    *See separate Pulmonary Artery Catheter Insertion Procedure Note if also placed*    Indications  IV access    Procedure Details  1% lidocaine S.Q. N/A    Vein initially entered with:  18G needle  Technique:  ultrasound and with wire guide  Correct placement confirmed [id number]  Ectopy: no  Vein cannulated with:   Size: 76F   Length(cm): 10   Lumen: Introducer  55F-20cm SLC   Coating: Sulfadiazine/Chlorhexidine  Insertion depth: 10 cm  Secured by: suturing and bioocclusive dressing    Successful placement: Yes  Venous Site: internal jugular  Attempted Sites: Left internal jugular    Complications  none    Condition  Pt tolerated procedure well. Yes  Patient's condition at end of procedure: no change from prior    Plan/Orders  Chest X-Ray: to be completed post-Op    EBL  4 mL    Disposition  OR    Placed by Christella Scheuermann, CRNA and myself.  I was personally present throughout.    Ottie Glazier, MD  08/26/2012  2:05 PM
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
## 2012-08-27 ENCOUNTER — Encounter: Payer: Self-pay | Admitting: Urology

## 2012-08-27 HISTORY — PX: PICC INSERTION GREATER THAN 5 YEARS -SMH ONLY: IMG10239

## 2012-08-27 LAB — CBC AND DIFFERENTIAL
Baso # K/uL: 0 10*3/uL (ref 0.0–0.1)
Basophil %: 0 % (ref 0.1–1.2)
Eos # K/uL: 0 10*3/uL (ref 0.0–0.4)
Eosinophil %: 0 % — ABNORMAL LOW (ref 0.7–5.8)
Hematocrit: 32 % — ABNORMAL LOW (ref 34–45)
Hemoglobin: 10.4 g/dL — ABNORMAL LOW (ref 11.2–15.7)
Lymph # K/uL: 0.7 10*3/uL — ABNORMAL LOW (ref 1.2–3.7)
Lymphocyte %: 6.8 % — ABNORMAL LOW (ref 19.3–51.7)
MCV: 93 fL (ref 79–95)
Mono # K/uL: 0.6 10*3/uL (ref 0.2–0.9)
Monocyte %: 5.4 % (ref 4.7–12.5)
Neut # K/uL: 9.5 10*3/uL — ABNORMAL HIGH (ref 1.6–6.1)
Platelets: 146 10*3/uL — ABNORMAL LOW (ref 160–370)
RBC: 3.5 MIL/uL — ABNORMAL LOW (ref 3.9–5.2)
RDW: 16 % — ABNORMAL HIGH (ref 11.7–14.4)
Seg Neut %: 87.8 % — ABNORMAL HIGH (ref 34.0–71.1)
WBC: 10.8 10*3/uL — ABNORMAL HIGH (ref 4.0–10.0)

## 2012-08-27 LAB — BASIC METABOLIC PANEL
Anion Gap: 8 (ref 7–16)
CO2: 23 mmol/L (ref 20–28)
Calcium: 7.9 mg/dL — ABNORMAL LOW (ref 8.8–10.2)
Chloride: 112 mmol/L — ABNORMAL HIGH (ref 96–108)
Creatinine: 0.56 mg/dL (ref 0.51–0.95)
GFR,Black: 148 *
GFR,Caucasian: 128 *
Glucose: 126 mg/dL — ABNORMAL HIGH (ref 60–99)
Lab: 7 mg/dL (ref 6–20)
Potassium: 3.9 mmol/L (ref 3.3–5.1)
Sodium: 143 mmol/L (ref 133–145)

## 2012-08-27 LAB — VENOUS BLOOD GAS
Base Excess,VENOUS: 2 mmol/L (ref <=2)
Bicarbonate,VENOUS: 28 mmol/L (ref 21–28)
CO2 (Calc),VENOUS: 30 mmol/L (ref 22–31)
CO: 0.8 % (ref 0.0–1.4)
FO2 HB,VENOUS: 75 % (ref 63–83)
Hemoglobin: 10.4 g/dL — ABNORMAL LOW (ref 11.2–15.7)
Methemoglobin: 0.2 % (ref 0.0–1.0)
PCO2,VENOUS: 54 mm Hg — ABNORMAL HIGH (ref 40–50)
PH,VENOUS: 7.34 (ref 7.32–7.42)
PO2,VENOUS: 45 mm Hg — ABNORMAL HIGH (ref 25–43)

## 2012-08-27 LAB — PLATELET: Platelets: TRANSFUSED

## 2012-08-27 LAB — PHOSPHORUS: Phosphorus: 3.5 mg/dL (ref 2.7–4.5)

## 2012-08-27 LAB — MAGNESIUM: Magnesium: 1.6 mEq/L (ref 1.3–2.1)

## 2012-08-27 LAB — AEROBIC CULTURE: Aerobic Culture: 0

## 2012-08-27 MED ORDER — SODIUM CHLORIDE 0.9 % INJ (FLUSH) WRAPPED *I*
10.0000 mL | Status: DC | PRN
Start: 2012-08-27 — End: 2012-09-01

## 2012-08-27 MED ORDER — CIPROFLOXACIN HCL 500 MG PO TABS *I*
500.0000 mg | ORAL_TABLET | Freq: Two times a day (BID) | ORAL | Status: DC
Start: 2012-08-27 — End: 2012-08-28
  Administered 2012-08-27 (×2): 500 mg via ORAL
  Filled 2012-08-27 (×5): qty 1

## 2012-08-27 MED ORDER — HYDROMORPHONE HCL 2 MG/ML IJ SOLN *WRAPPED*
0.2500 mg | Freq: Once | INTRAMUSCULAR | Status: AC
Start: 2012-08-27 — End: 2012-08-27
  Administered 2012-08-27: 0.25 mg via INTRAVENOUS

## 2012-08-27 MED ORDER — LIDOCAINE HCL 1 % IJ SOLN
1.0000 mL | Freq: Once | INTRAMUSCULAR | Status: AC | PRN
Start: 2012-08-27 — End: 2012-08-27
  Administered 2012-08-27: 1 mL via INTRADERMAL

## 2012-08-27 MED ORDER — FONDAPARINUX SODIUM 2.5 MG/0.5ML SC SOLN *I*
2.5000 mg | Freq: Every day | SUBCUTANEOUS | Status: DC
Start: 2012-08-27 — End: 2012-09-01
  Administered 2012-08-27 – 2012-08-31 (×5): 2.5 mg via SUBCUTANEOUS
  Filled 2012-08-27 (×6): qty 0.5

## 2012-08-27 MED ORDER — HYDROMORPHONE HCL 2 MG/ML IJ SOLN *WRAPPED*
INTRAMUSCULAR | Status: AC
Start: 2012-08-27 — End: 2012-08-27
  Filled 2012-08-27: qty 1

## 2012-08-27 MED ORDER — AMPICILLIN 500 MG PO CAPS *I*
500.0000 mg | ORAL_CAPSULE | Freq: Three times a day (TID) | ORAL | Status: DC
Start: 2012-08-27 — End: 2012-08-28
  Administered 2012-08-27 – 2012-08-28 (×3): 500 mg via ORAL
  Filled 2012-08-27 (×8): qty 1

## 2012-08-27 MED ORDER — SODIUM CHLORIDE 0.9 % INJ (FLUSH) WRAPPED *I*
10.0000 mL | Freq: Three times a day (TID) | Status: DC | PRN
Start: 2012-08-27 — End: 2012-09-01

## 2012-08-27 MED ORDER — HYDROMORPHONE HCL PF 1 MG/ML IJ SOLN *WRAPPED*
0.2500 mg | INTRAMUSCULAR | Status: DC | PRN
Start: 2012-08-27 — End: 2012-08-28
  Administered 2012-08-27 – 2012-08-28 (×3): 0.25 mg via INTRAVENOUS
  Filled 2012-08-27 (×4): qty 1

## 2012-08-27 MED ORDER — LIDOCAINE HCL 1 % IJ SOLN
1.0000 mL | Freq: Once | INTRAMUSCULAR | Status: DC | PRN
Start: 2012-08-27 — End: 2012-08-27

## 2012-08-27 NOTE — Progress Notes (Signed)
Introducer removed per anesthesia MD, Mertie Clause. NGT to gravity drainage. Patient tolerated PO medications with some difficulty swallowing due to "this tube in my throat". Awaiting transport to [phone].

## 2012-08-27 NOTE — Progress Notes (Signed)
Pt boarding in PACU on floor status because unable to go to floor with introducer in place. Overnight team felt it was a safety issue to change line over wire with pt's PMH and there was no other access attainable. This AM 22g IV in place and discussed with surgical team introducer removal but they want it to remain until better access is obtained. Plan is for urgical team to take care of new line placement (either change over wire to TLC or place new line or have PICC placed). Once introducer is removed, pt can go to floor.

## 2012-08-27 NOTE — Progress Notes (Addendum)
Urology Progress Note    Cindy Ochoa is a 26 y.o. female POD 2 s/p Ileal conduit for neurogenic bladder    Subjective:    No acute events o/n.   PICC placed and NGT removed yesterday  Denies CP, SOB, n/v, abd pain, f/c.   Pain well controlled  NPO, requesting clears this AM           . ciprofloxacin  500 mg Oral Q12H SCH   . ampicillin  500 mg Oral TID   . fondaparinux  2.5 mg Subcutaneous Daily         Objective:    Temp (24hrs), Avg:37.2 C (99 F), Min:36.7 C (98.1 F), Max:38 C (100.4 F)      Blood pressure 123/76, pulse 109, temperature 36.9 C (98.4 F), temperature source Temporal, resp. rate 18, height 1.62 m (5' 3.78"), weight 86.5 kg (190 lb 11.2 oz), last menstrual period 08/19/2012, SpO2 95.00%.    Date 08/27/12 0700 - 08/28/12 0659 08/28/12 0700 - 08/29/12 0659   Shift 9604-5409 1500-2259 2300-0659 24 Hour Total 0700-1459 1500-2259 2300-0659 24 Hour Total   I  N  T  A  K  E   I.V.  (mL/kg/hr) 1000  (1.4) 1000  (1.4) 804.2  (1.2) 2804.2  (1.4)          Volume (mL) (sodium chloride IV) 1000 1000 804.2 2804.2        Shift Total  (mL/kg) 1000  (11.6) 1000  (11.6) 804.2  (9.3) 2804.2  (32.4)       O  U  T  P  U  T   Urine  (mL/kg/hr) 925  (1.3) 600  (0.9) 500  (0.7) 2025  (1)          Output (ml) (Urostomy Ileal conduit RLQ) 925 [phone]        Emesis/NG output 150   150          Output (ml) ([REMOVED] NG/OG Tube Nasogastric) 150   150        Drains 110 50 35 195          Output (ml) (Closed/Suction Drain [employer] 1 Left Abdomen) 105 50 30 185          Output (ml) (Closed/Suction Drain Al Pimple 2 Right Abdomen) 5 0 5 10        Shift Total  (mL/kg) 1185  (13.7) 650  (7.5) 535  (6.2) 2370  (27.4)       NET -185 350 269.2 434.2       Weight (kg) 86.5 86.5 86.5 86.5 86.5 86.5 86.5 86.5         EXAM:  Gen-NAD  Lungs-CTAB  CV-RRR  Abd-+bs, soft, NT, ND  Incisions clean dry and intact, Urostomy with peach tea colored  Ext-no LE edema, WWP      Labs:    Heme:  Recent Labs  Lab 08/28/12  0456   08/27/12  0233 08/26/12  1745  08/26/12  0930 08/22/12  1218   WBC 9.2  --  10.8* 7.0  --  7.7 8.9   Hemoglobin 8.9*  < > 10.4* 10.5*  < > 13.1 14.4   Hematocrit 28*  --  32* 32*  --  39 44   Platelets 115*  --  146* 92*  --  76* 77*   Protime  --   --   --  12.8*  --   --  12.3   INR  --   --   --  1.2  --   --  1.2   aPTT  --   --   --  35.7  --   --  37.8   < > = values in this interval not displayed.    Chem:    Recent Labs  Lab 08/28/12  0456 08/27/12  0233 08/22/12  1218   Sodium 141 143 139   Potassium 3.3 3.9 3.6   Chloride 109* 112* 105   CO2 21 23 19*   UN 5* 7 9   Creatinine 0.50* 0.56 0.45*   Glucose 82 126* 87   Calcium 7.9* 7.9* 9.4   Magnesium 1.4 1.6  --    Phosphorus 1.9* 3.5  --    No results found for this basename: PGLU,  in the last 168 hours    Cultures:  Aerobic Culture   Date Value Range Status   08/26/2012 .   Final   08/22/2012 Lab Cancel   Final   08/22/2012 Proteus mirabilis   Final       >100,000/ml   08/22/2012 Klebsiella pneumoniae   Final      20,000/ml   08/22/2012 Escherichia coli   Final      >100,000/ml   08/22/2012 Enterococcus species   Final       70,000/ml      Uncomplicated urinary tract infections caused by      Enterococcus are usually responsive to Ampicillin. Serious      infections caused by Enterococcus are usually treated with      combination therapy. Consult Infectious Diseases.      .      Vancomycin restricted at [facility]:  Requires approval of      Infectious Diseases (pager [phone]) or initial order      will be discontinued at 72 hours.      .        Bacterial Blood Culture   Date Value Range Status   05/18/2012 .   Final   05/17/2012 .   Final   12/08/2011 .   Final         Imaging:  * Portable Chest Standard Ap Single View    08/27/2012  IMPRESSION:   Lines and tubes as positioned above. Improved aeration.   END REPORT     * Portable Chest Standard Ap Single View    08/27/2012  IMPRESSION:   1. Left IJ central venous catheter tip in the left brachiocephalic  vein/SVC  confluence. No pneumothorax.   2. Left pleural effusion with left basilar atelectasis.   END REPORT         A/P: Cindy Ochoa is a 26 y.o. female POD 2 s/p Ileal conduit for neurogenic bladder.    Hemodynamically stable  GU: Flush stents with 3-65ml NS per shift. Strict Is and Os, Monitor Labs (cr, HCT, Plts, INR, WCT)  Will get JP cr today  Diet: Advance to sips of clears/clears this AM, possibly reg diet by dinner  ID: continue cipro and ampicillin for UTI x 5 days total  DVT prophylaxis: Continue Anticoagulation (Fondaparinux)  Neuro: Pain control  I saw and evaluated the patient. I agree with the resident's/fellow's findings and plan of care as documented above. Details of my evaluation are as follows:   T 38 max. Wound OK. Try to get up into chair. Leave drains in. Labs stable. Stoma fine and urine less bloody. No bowel sounds (or few) but will give sips.     Shukri Nistler M Jezabelle Chisolm,  MD        Clearance Coots  PGY 3  Pager # [phone]  Urology

## 2012-08-27 NOTE — Progress Notes (Signed)
Brief Note:    NG tube produced 150, 50 over the last shift.  NG tube removed.  Patient tolerated procedure.    Sindy Messing, MD

## 2012-08-27 NOTE — Progress Notes (Signed)
Awake and asking where her sister is   Sister called ina nd said she would be in to visit      Repositioned to left side     Left JP with serosang drainage

## 2012-08-27 NOTE — Progress Notes (Signed)
Noted urethral foley cath continues to be draining clear colorless urine.  Dr. Beulah Gandy aware and writer to continue irrigation per md.  Dr. Beulah Gandy also aware of urostomy draining bloody urine.  Will continue to monitor.

## 2012-08-27 NOTE — Progress Notes (Signed)
0130 sister called to inquire on patient's status and updated   Sister  Dorathy Daft said she will be in  0430 sister Dorathy Daft called    Updated on sister's statusn

## 2012-08-27 NOTE — Progress Notes (Signed)
Dr Alfonse Spruce and urology resident paged re status of  Introducer left IJ.

## 2012-08-27 NOTE — Progress Notes (Signed)
Report given to nurse on 05-1399. Awaiting MD removal of IJ introducer prior to transferral of patient to unit.

## 2012-08-27 NOTE — Progress Notes (Addendum)
Urology  Post Op Note      Procedure:  Ileal conduit for neurogenic bladder       Subjective:  Patient sleeping but easy to awake from sleep. Patient states that she only has mild pain in her abdomen.      Objective:  BP 156/88  Pulse 98  Temp(Src) 36.2 C (97.2 F) (Temporal)  Resp 18  Ht 1.62 m (5' 3.78")  Wt 86.5 kg (190 lb 11.2 oz)  BMI 32.96 kg/m2  SpO2 92%  LMP 08/19/2012  Comfortable, in NAD  RRR, no m/r/g; Lungs CTA, unlabored breathing  Abdomen soft, non-tender, distended, BS in all quadrants  Incision dressing clean and dry, with subcutaneous JP with minimal output noted  JP noted to have ~15cc of sanguinous fluid in bulb with 100cc previously noted because it had not be previously noted.  Foley draining kool-ade colored urine with ~300 noted in foley bag    HCT noted to be stable at 32 from previous draw    Assessment:  Cindy Ochoa is a 26 y.o. 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  VSS.  Pain is well controlled.  Doing well in the early post-operative period.  No concerns at this time.    Plan:  Continue orders per  primary team  One time 0.25 for pain control  Flushed 3cc NS stent each tube per orders after taking urostomy dressing down and nursing a 20 french Angiocath flushing each tube. Patient tolerated well with NS dripping from caths after completion of procedure.   Will continue to monitor patient clinically      Reinaldo Raddle, MD  PGY-1  Pager [phone]             I saw and evaluated the patient. I agree with the resident's/fellow's findings and plan of care as documented above. Details of my evaluation are as follows:   Afebrile. Good urine output. Creatinine 0.56, Hct 32, WBC 12000, Plt 135000 Feels well. Urine output fairly clear. Stents in good position on KUB. Wants NG out and it is putting out very little. Will put on gravity and try to get it out later today. Some issue about introducer and changing it to central IV line, will be resolved. Dressing dry.     Deitra Mayo, MD

## 2012-08-27 NOTE — Progress Notes (Signed)
Chest x-ray done per MD order to verify PICC placement.

## 2012-08-27 NOTE — Progress Notes (Signed)
1250 pt arrived from PACU via bed. Pt oriented to unit and touch pad call light hooked up for pt use.  Pt with urostomy to right lower quadrant leaking through wafer. Pouch and wafer change completed. Stoma red and viable with 2 stents in place, peristomal skin clean dry and intact. Pt turned and positioned 4 open pressure ulcers to sacrum and ischium areas cleaned and aquacel and allevyn changed. Small amount of old bleeding per rectum observed and cleaned. Low air loss mattress ordered, awaiting it's arrival. NGT to gravity draining small amount of brown-red drainage. Pt resting comfortably at this time. Will continue to monitor.

## 2012-08-27 NOTE — Procedures (Signed)
Please enter procedure, pre- and post-procedure diagnoses in fields above and remove this line of text.    PICC ASSESSMENT AND PROCEDURE NOTE    Lot #:  [id number]                                     Reference # :    Time out documentation completed in Procedure navigator prior to procedure:  Yes    Indications  Long Term Abx    Education Provided to Patient /Family : yes  Catheter Associated Blood Stream Infection Education Handout Provided and Explained to Pt/family : yes  Reinforcement Needed : no  Recommendations : PICC Line  Refer to Radiology :No  Name of Provider Contacted :     Procedure Details Procedure Date :08/27/2012  Time In :9:50 AM  Time Out :   Guide Wire Removed : yes    Findings  Catheter inserted to 34 cm, with 3 cm exposed.  Mid upper arm circumference is 29.5 cm.    Complications none    Condition  Pain Rating (1-10) : 1  Comfort Measures Provided : Pain Medication as Ordered by Provider   Patient Care Booklet Given to Patient : yes  Patient Care Booklet Placed in Chart : no    Plan/Orders  STAT Portable Chest X-Ray Ordered : yes  VBG Sent : yes  Other :   Floor Nurse __  Is aware of need for orders to use the line from Provider     PICC Service  [facility] [phone] - [phone]  [facility] 04-1615 - [phone]    EBL  Estimated Blood Loss : Minimal    Disposition  Pt tolerated well.     Forrest Moron, RN  08/27/2012  9:50 AM

## 2012-08-28 ENCOUNTER — Other Ambulatory Visit: Payer: Self-pay | Admitting: Gastroenterology

## 2012-08-28 LAB — PHOSPHORUS: Phosphorus: 1.9 mg/dL — ABNORMAL LOW (ref 2.7–4.5)

## 2012-08-28 LAB — CBC AND DIFFERENTIAL
Baso # K/uL: 0 10*3/uL (ref 0.0–0.1)
Basophil %: 0.1 % (ref 0.1–1.2)
Eos # K/uL: 0 10*3/uL (ref 0.0–0.4)
Eosinophil %: 0.1 % — ABNORMAL LOW (ref 0.7–5.8)
Hematocrit: 28 % — ABNORMAL LOW (ref 34–45)
Hemoglobin: 8.9 g/dL — ABNORMAL LOW (ref 11.2–15.7)
Lymph # K/uL: 2.5 10*3/uL (ref 1.2–3.7)
Lymphocyte %: 27.4 % (ref 19.3–51.7)
MCV: 96 fL — ABNORMAL HIGH (ref 79–95)
Mono # K/uL: 1 10*3/uL — ABNORMAL HIGH (ref 0.2–0.9)
Monocyte %: 10.3 % (ref 4.7–12.5)
Neut # K/uL: 5.7 10*3/uL (ref 1.6–6.1)
Platelets: 115 10*3/uL — ABNORMAL LOW (ref 160–370)
RBC: 2.9 MIL/uL — ABNORMAL LOW (ref 3.9–5.2)
RDW: 16.2 % — ABNORMAL HIGH (ref 11.7–14.4)
Seg Neut %: 62.1 % (ref 34.0–71.1)
WBC: 9.2 10*3/uL (ref 4.0–10.0)

## 2012-08-28 LAB — BASIC METABOLIC PANEL
Anion Gap: 11 (ref 7–16)
CO2: 21 mmol/L (ref 20–28)
Calcium: 7.9 mg/dL — ABNORMAL LOW (ref 8.8–10.2)
Chloride: 109 mmol/L — ABNORMAL HIGH (ref 96–108)
Creatinine: 0.5 mg/dL — ABNORMAL LOW (ref 0.51–0.95)
GFR,Black: 153 *
GFR,Caucasian: 133 *
Glucose: 82 mg/dL (ref 60–99)
Lab: 5 mg/dL — ABNORMAL LOW (ref 6–20)
Potassium: 3.3 mmol/L (ref 3.3–5.1)
Sodium: 141 mmol/L (ref 133–145)

## 2012-08-28 LAB — PLATELET: Platelets: TRANSFUSED

## 2012-08-28 LAB — CREATININE, BODY FLUID: Creat,FL: 0.48 mg/dL

## 2012-08-28 LAB — MAGNESIUM: Magnesium: 1.4 mEq/L (ref 1.3–2.1)

## 2012-08-28 MED ORDER — HYDROMORPHONE HCL PF 1 MG/ML IJ SOLN *WRAPPED*
0.2500 mg | INTRAMUSCULAR | Status: AC
Start: 2012-08-28 — End: 2012-08-29
  Administered 2012-08-28 – 2012-08-29 (×8): 0.25 mg via INTRAVENOUS
  Filled 2012-08-28 (×8): qty 1

## 2012-08-28 MED ORDER — D5W & 0.45% NACL IV SOLN *I*
125.0000 mL/h | INTRAVENOUS | Status: DC
Start: 2012-08-28 — End: 2012-08-30
  Administered 2012-08-28 – 2012-08-30 (×8): 125 mL/h via INTRAVENOUS

## 2012-08-28 NOTE — Plan of Care (Signed)
Problem: Pain/Comfort  Goal: Patient's pain or discomfort is manageable  Outcome: Maintaining  Pt dilaudid IV modified to be ATC. Given every 3 hours as ordered.

## 2012-08-28 NOTE — Op Note (Signed)
Cindy Ochoa, Cindy Ochoa  MR #:  [id number]   ACCOUNT #:  [id number] DOB:  08/28/1986    AGE:  26     SURGEON:  Deitra Mayo, MD  CO-SURGEON:    ASSISTANT:  Jeoffrey Massed, MD,PHD,RES.  SURGERY DATE:  08/26/2012    OPERATIVE PROCEDURE:  Ileal loop urinary diversion.    ANESTHESIA:  General endotracheal.    PREOPERATIVE DIAGNOSIS:  Neurogenic bladder.    POSTOPERATIVE DIAGNOSIS:  Neurogenic bladder.    COMPLICATIONS OF SURGERY:  None.    ESTIMATED BLOOD LOSS:  About 500 cc.    FLUIDS IN THE OPERATING ROOM:  4000 Ringer's lactate and two 5 packs of platelets.    TUBES LEFT IN:  A 19 Blake drain through a separate stab wound going into the left pelvis, over to the right pelvis and near the ureterointestinal anastomoses, and two 6-French single-J stents through the loop up each ureter to the renal pelves and a Foley catheter, and finally a 10-French round Blake drain subcutaneously and a nasogastric tube.    PROCEDURE:  The patient was prepped and draped in the usual manner lying supine on the operating table with the kidney rest up and the table somewhat hyperextended after the induction of general endotracheal anesthesia.  After a 24 French three-way Foley catheter had been placed, a midline incision was made going from 2 cm above the pubis to the umbilicus and slightly around it to the left side.  Sharp dissection was used to go through skin and subcutaneous tissue, which was extremely ample.  In the lower part of the body, there was about 4 or 5 inches of subcutaneous tissue.  The anterior rectus fascia was eventually reached and incised in the midline.  The bellies of the rectus muscles separated.  The peritoneum was entered.  The abdomen explored.  There were some distended bowel.  The NG tube was felt to be in the stomach.  The bowel was retracted cephaladly and the right retroperitoneum exposed.  The right ureter was identified.  It was traced caudad underneath the uterine artery.  There were a lot of vessels  going in and out of it and each one had to be delicately clipped or clamped and tied.  We finally traced the ureter to just above where it inserted into the bladder, crossclamped it, severed it, and over sewed the end with heavy Vicryl. Care was taken not to devascularize the ureter and there was ample oozing from the cut end before we clamped and ligated it with heavy Vicryl too to allow it to distend.  Attention was then placed to the left side.   We went lateral to the colon and incised the peritoneum and some of the lateral attachments of the bladder.  We left the attachments to the uterus; however, including the anterior and posterior support structures.  We entered the retroperitoneum, identified the ureter and isolated it.  It was traced distal to the uterine artery as well, crossclamped and severed.  We traced the ureter cephaladly for about an additional 4 cm in order to get sufficient length on it.  We then brought the ureter anterior to the great vessels out the right retroperitoneal incision.  Thus both ureters were on the right side of the abdomen now.  The cecum was identified as well as the appendix and the ileum identified.  About 12 cm from the ileocecal valve was an appropriate place in the mesentery for [place] to make a window  and incise the mesentery using the LigaSure.  We then took a loop that was about 10 cm long, and went through another appropriate area in the mesentery to allow good blood flow to the loop.  We took that with the LigaSure as well.  A standard GIA and TA 55 stapled side-to-side, functional end-to-end bowel anastomosis was performed.  The donut seemed quite patent.  It should be noted that throughout the entire case, there was a lot of oozing because of her thrombocytopenia and she got platelets at the beginning and then subsequently near the end.  The trap in the mesentery was closed with 3-0 silk figure-of-eight sutures interrupted and then the ileal loop was irrigated with  about a liter of saline until the irrigant was clear.  The stomal end was entirely incised but only a small opening made in the proximal end of the loop.  This was again re stapled closed so that the entire distal end of the loop was closed with staples.  The left ureter was anastomosed more towards the heel all of the loop then the right ureter was.  Each one anastomosed separately using interrupted 4-0 Monocryl sutures with 6-French single-J stents being passed through the loop up each ureter into each renal pelvis prior to completion of each anastomosis.  On the left side, there was some bleeding though the stent and so we pulled back and there was less bleeding but the left ureter never irrigated perfectly well.  The right ureter irrigated fine. Each stent was sewed into the stomal end of the loop.  The site of the premarked stoma, superior to the umbilicus on the right side, was then incised circumferentially and a button of skin and then a large amount of subcutaneous tissue about 3 inches deep in this location which was above the umbilicus was removed.  A cruciate incision made in the anterior rectus fascia, the belly of the right rectus abdominis incised and then an incision made in the posterior rectus fascia.  This allowed the 2 index fingers of the surgeon to pass through.  The loop was grasped with a Babcock clamp coming out the ostomy and was rose budded to the skin with 2-0 Vicryl rose budding sutures.  The 19 Blake drain was placed through a separate stab wound on the left side of the abdomen and sewn in with heavy silk.  It was placed in the left deep pelvis, going over the midline to the right deep pelvis and then ending up near the ureterointestinal anastomoses.  After a wound sweep was completed, the anterior rectus fascia was closed with doubly looped number 1 PDS with 2 running sutures coming from either end.  A couple of interrupted #1 Vicryl figure-of-eight sutures were used to close the  fascia as well.  The fascia closure seemed excellent.  The wound was copiously irrigated with antibiotic solution, and a 10 Blake drain placed through a separate stab wound of the subcutaneous space.  The skin was closed with staples.  Sterile dressings were applied, and she was taken to the recovery room in stable condition.  Prior to the closure, the kidney rest was lowered and she was taken out of hyperextension.             ______________________________  Deitra Mayo, MD    EMM/MODL  DD:  08/26/2012 19:24:47  DT:  08/27/2012 10:22:07  Job #:  [id number]

## 2012-08-29 LAB — BASIC METABOLIC PANEL
Anion Gap: 8 (ref 7–16)
CO2: 23 mmol/L (ref 20–28)
Calcium: 7.8 mg/dL — ABNORMAL LOW (ref 8.8–10.2)
Chloride: 106 mmol/L (ref 96–108)
Creatinine: 0.46 mg/dL — ABNORMAL LOW (ref 0.51–0.95)
GFR,Black: 158 *
GFR,Caucasian: 137 *
Glucose: 107 mg/dL — ABNORMAL HIGH (ref 60–99)
Lab: 4 mg/dL — ABNORMAL LOW (ref 6–20)
Potassium: 3.1 mmol/L — ABNORMAL LOW (ref 3.3–5.1)
Sodium: 137 mmol/L (ref 133–145)

## 2012-08-29 LAB — CBC AND DIFFERENTIAL
Baso # K/uL: 0 10*3/uL (ref 0.0–0.1)
Basophil %: 0 % (ref 0.1–1.2)
Eos # K/uL: 0.1 10*3/uL (ref 0.0–0.4)
Eosinophil %: 0.5 % — ABNORMAL LOW (ref 0.7–5.8)
Hematocrit: 28 % — ABNORMAL LOW (ref 34–45)
Hemoglobin: 9 g/dL — ABNORMAL LOW (ref 11.2–15.7)
Lymph # K/uL: 2.5 10*3/uL (ref 1.2–3.7)
Lymphocyte %: 27.3 % (ref 19.3–51.7)
MCV: 94 fL (ref 79–95)
Mono # K/uL: 0.9 10*3/uL (ref 0.2–0.9)
Monocyte %: 10 % (ref 4.7–12.5)
Neut # K/uL: 5.8 10*3/uL (ref 1.6–6.1)
Platelets: 98 10*3/uL — ABNORMAL LOW (ref 160–370)
RBC: 3 MIL/uL — ABNORMAL LOW (ref 3.9–5.2)
RDW: 15.5 % — ABNORMAL HIGH (ref 11.7–14.4)
Seg Neut %: 62.2 % (ref 34.0–71.1)
WBC: 9.3 10*3/uL (ref 4.0–10.0)

## 2012-08-29 LAB — PHOSPHORUS: Phosphorus: 1.9 mg/dL — ABNORMAL LOW (ref 2.7–4.5)

## 2012-08-29 LAB — MAGNESIUM: Magnesium: 1.3 mEq/L (ref 1.3–2.1)

## 2012-08-29 MED ORDER — OXYCODONE HCL 5 MG PO TABS *I*
5.0000 mg | ORAL_TABLET | ORAL | Status: AC
Start: 2012-08-29 — End: 2012-09-01
  Administered 2012-08-29 – 2012-09-01 (×16): 5 mg via ORAL
  Filled 2012-08-29 (×18): qty 1

## 2012-08-29 MED ORDER — OXYCODONE HCL 5 MG PO TABS *I*
5.0000 mg | ORAL_TABLET | ORAL | Status: DC | PRN
Start: 2012-08-29 — End: 2012-08-29

## 2012-08-29 MED ORDER — METOPROLOL TARTRATE 1 MG/ML IV SOLN *I*
5.0000 mg | INTRAVENOUS | Status: DC | PRN
Start: 2012-08-29 — End: 2012-09-01

## 2012-08-29 MED ORDER — MAGNESIUM SULFATE 2GM IN 50ML STERILE WATER *A*
2000.0000 mg | Freq: Once | INTRAMUSCULAR | Status: AC
Start: 2012-08-29 — End: 2012-08-29
  Administered 2012-08-29: 2000 mg via INTRAVENOUS
  Filled 2012-08-29: qty 50

## 2012-08-29 MED ORDER — POTASSIUM & SODIUM PHOSPHATES 280-160-250 MG PO PACK *I*
1.0000 | PACK | Freq: Four times a day (QID) | ORAL | Status: AC
Start: 2012-08-29 — End: 2012-08-30
  Administered 2012-08-29 (×3): 1 via ORAL
  Filled 2012-08-29 (×4): qty 1

## 2012-08-29 MED ORDER — HYDROMORPHONE HCL PF 1 MG/ML IJ SOLN *WRAPPED*
0.2500 mg | INTRAMUSCULAR | Status: DC | PRN
Start: 2012-08-29 — End: 2012-08-29

## 2012-08-29 MED ORDER — HYDROMORPHONE HCL PF 1 MG/ML IJ SOLN *WRAPPED*
0.2500 mg | INTRAMUSCULAR | Status: DC
Start: 2012-08-29 — End: 2012-08-29

## 2012-08-29 MED ORDER — BISACODYL 10 MG RE SUPP *I*
10.0000 mg | Freq: Every day | RECTAL | Status: DC
Start: 2012-08-29 — End: 2012-08-31
  Administered 2012-08-29: 10 mg via RECTAL

## 2012-08-29 MED ORDER — HYDROMORPHONE HCL PF 1 MG/ML IJ SOLN *WRAPPED*
0.2500 mg | INTRAMUSCULAR | Status: AC | PRN
Start: 2012-08-29 — End: 2012-08-30
  Administered 2012-08-29 – 2012-08-30 (×2): 0.25 mg via INTRAVENOUS
  Filled 2012-08-29 (×2): qty 1

## 2012-08-29 MED ORDER — KETOROLAC TROMETHAMINE 30 MG/ML IJ SOLN *I*
15.0000 mg | Freq: Four times a day (QID) | INTRAMUSCULAR | Status: DC
Start: 2012-08-29 — End: 2012-09-01
  Administered 2012-08-29 – 2012-08-31 (×11): 15 mg via INTRAVENOUS
  Filled 2012-08-29 (×13): qty 1

## 2012-08-29 NOTE — Progress Notes (Signed)
RLQ small JP drain removed without complications. Occlusive dressing applied.  Pt tolerated procedures well.    Clearance Coots, DO, MPH  08/29/2012  10:00 AM

## 2012-08-29 NOTE — Progress Notes (Addendum)
Urology Progress Note    Cindy Ochoa is a 26 y.o. female POD 3 s/p Ileal conduit for neurogenic bladder    Subjective:    No acute events o/n.   Reports increasing stomach spasms and pain since starting PO intake  Requested increasing pain meds  Tachy 100s  Denies CP, SOB, n/v, f/c.   Tolerating clears, requesting regular food             . magnesium sulfate  2,000 mg Intravenous Once   . potassium & sodium phosphates  1 packet Oral 4x Daily WC   . HYDROmorphone PF  0.25 mg Intravenous Q2H (Scheduled)   . fondaparinux  2.5 mg Subcutaneous Daily         Objective:    Temp (24hrs), Avg:37.1 C (98.8 F), Min:37 C (98.6 F), Max:37.3 C (99.1 F)      Blood pressure 140/86, pulse 105, temperature 37.1 C (98.8 F), temperature source Temporal, resp. rate 18, height 1.62 m (5' 3.78"), weight 86.5 kg (190 lb 11.2 oz), last menstrual period 08/19/2012, SpO2 94.00%.    Date 08/28/12 0700 - 08/29/12 0659 08/29/12 0700 - 08/30/12 0659   Shift 0700-1459 1500-2259 2300-0659 24 Hour Total 0700-1459 1500-2259 2300-0659 24 Hour Total   I  N  T  A  K  E   P.O. 480 [phone]          P.O. 480 424 529 4790        I.V.  (mL/kg/hr) 1193.8  (1.7) 1000  (1.4) 1000  (1.4) 3193.8  (1.5)          Volume (mL) (sodium chloride IV) 583.3   583.3          Volume (mL) (Dextrose 5 %- Sodium Chloride 0.45%) 610.4 1000 1000 2610.4        Shift Total  (mL/kg) 1673.8  (19.3) 1120  (12.9) 1400  (16.2) 4193.8  (48.5)       O  U  T  P  U  T   Urine  (mL/kg/hr) 650  (0.9) 450  (0.7) 700  (1) 1800  (0.9) 550   550      Output (ml) (Urostomy Ileal conduit RLQ) 650 [phone] 550   550    Drains 35 0 30 65 60   60      Output (ml) (Closed/Suction Drain [employer] 1 Left Abdomen) 30 0 30 60 60   60      Output (ml) (Closed/Suction Drain Al Pimple 2 Right Abdomen) 5 0 0 5 0   0    Shift Total  (mL/kg) 685  (7.9) 450  (5.2) 730  (8.4) 1865  (21.6) 610  (7.1)   610  (7.1)   NET 988.8 [phone] -610   -610   Weight (kg) 86.5 86.5 86.5 86.5  86.5 86.5 86.5 86.5         EXAM:  Gen-NAD  Lungs-CTAB  CV-RRR  Abd-+BS, soft, NT, distended  Incisions clean dry and intact, Urostomy with- light yellow urine with debris  JPs with min ss  Ext-no LE edema, WWP      Labs:    Heme:  Recent Labs  Lab 08/29/12  0206 08/28/12  0456  08/27/12  0233 08/26/12  1745  08/26/12  0930 08/22/12  1218   WBC 9.3 9.2  --  10.8* 7.0  --  7.7 8.9   Hemoglobin 9.0* 8.9*  < > 10.4* 10.5*  < >  13.1 14.4   Hematocrit 28* 28*  --  32* 32*  --  39 44   Platelets 98* 115*  --  146* 92*  --  76* 77*   Protime  --   --   --   --  12.8*  --   --  12.3   INR  --   --   --   --  1.2  --   --  1.2   aPTT  --   --   --   --  35.7  --   --  37.8   < > = values in this interval not displayed.    Chem:    Recent Labs  Lab 08/29/12  0206 08/28/12  0456 08/27/12  0233 08/22/12  1218   Sodium 137 141 143 139   Potassium 3.1* 3.3 3.9 3.6   Chloride 106 109* 112* 105   CO2 23 21 23  19*   UN 4* 5* 7 9   Creatinine 0.46* 0.50* 0.56 0.45*   Glucose 107* 82 126* 87   Calcium 7.8* 7.9* 7.9* 9.4   Magnesium 1.3 1.4 1.6  --    Phosphorus 1.9* 1.9* 3.5  --    No results found for this basename: PGLU,  in the last 168 hours    Cultures:  Aerobic Culture   Date Value Range Status   08/26/2012 .   Final   08/22/2012 Lab Cancel   Final   08/22/2012 Proteus mirabilis   Final       >100,000/ml   08/22/2012 Klebsiella pneumoniae   Final      20,000/ml   08/22/2012 Escherichia coli   Final      >100,000/ml   08/22/2012 Enterococcus species   Final       70,000/ml      Uncomplicated urinary tract infections caused by      Enterococcus are usually responsive to Ampicillin. Serious      infections caused by Enterococcus are usually treated with      combination therapy. Consult Infectious Diseases.      .      Vancomycin restricted at [facility]:  Requires approval of      Infectious Diseases (pager [phone]) or initial order      will be discontinued at 72 hours.      .        Bacterial Blood Culture   Date Value Range Status    05/18/2012 .   Final   05/17/2012 .   Final   12/08/2011 .   Final         Imaging:  * Portable Chest Standard Ap Single View    08/27/2012  IMPRESSION:   Lines and tubes as positioned above. Improved aeration.   END REPORT     * Portable Chest Standard Ap Single View    08/27/2012  IMPRESSION:   1. Left IJ central venous catheter tip in the left brachiocephalic  vein/SVC confluence. No pneumothorax.   2. Left pleural effusion with left basilar atelectasis.   END REPORT         A/P: Cindy Ochoa is a 26 y.o. female POD 3 s/p Ileal conduit for neurogenic bladder.    CVS: Add lopressor prn for HR >100, EKG stable and normal sinus,Hemodynamically stable  GU: Flush stents with 3-22ml NS per shift. Strict Is and Os, Monitor Labs (cr, HCT, Plts, INR, WCT)  D/C small JP drain on right today  Diet: Clears this  AM,  reg diet by dinner  DVT prophylaxis: Continue Anticoagulation (Fondaparinux)  Neuro: dilaudid .25mg  q 3hrs prn, added PO oxy IR 5mg  q4, will add toradol     Clearance Coots  PGY 3  Pager # [phone]  Urology  I saw and evaluated the patient. I agree with the resident's/fellow's findings and plan of care as documented above. Details of my evaluation are as follows:   Afebrile. Wound OK. Subcutaneous drain removed. Labs stable. Stoma OK. Some bowel sounds; will keep on liquids and get a suppository today. Try to get up in chair.    Deitra Mayo, MD

## 2012-08-29 NOTE — Progress Notes (Addendum)
Urology Progress Note    Subjective:  No acute events  Pain well controlled  Tolerated clear liquids without nausea or vomiting  +BM  OOB to chair yesterday         . potassium chloride  20 mEq Intravenous Q1H   . pot & sod phosphates  2 tablet Oral BID   . potassium & sodium phosphates  1 packet Oral 4x Daily WC   . oxyCODONE  5 mg Oral Q4H   . ketorolac  15 mg Intravenous Q6H   . bisacodyl  10 mg Rectal Daily   . fondaparinux  2.5 mg Subcutaneous Daily          . dextrose 5 % and 0.45% NaCl Stopped (08/30/12 0540)     metoprolol, HYDROmorphone PF, sodium chloride, sodium chloride, sodium chloride, sodium chloride, ondansetron    Objective:    Date 08/29/12 0700 - 08/30/12 0659 08/30/12 0700 - 08/31/12 0659   Shift 0700-1459 1500-2259 2300-0659 24 Hour Total 0700-1459 1500-2259 2300-0659 24 Hour Total   I  N  T  A  K  E   P.O. 240 300  540          P.O. 240 300  540        I.V.  (mL/kg/hr) 875  (1.3) 750  (1.1)  1625          Volume (mL) (Dextrose 5 %- Sodium Chloride 0.45%) 875 750  1625        IV Piggyback 50   50          Volume (mL) (magnesium sulfate in sterile water infusion 2,000 mg) 50   50        Shift Total  (mL/kg) 1165  (13.5) 1050  (12.1)  2215  (25.6)       O  U  T  P  U  T   Urine  (mL/kg/hr) 1100  (1.6) 900  (1.3) 1450 3450          Output (ml) (Urostomy Ileal conduit RLQ) 1100 900 1450 3450        Drains 430 140 130 700          Output (ml) (Closed/Suction Drain [employer] 1 Left Abdomen) 430 140 130 700          Output (ml) ([REMOVED] Closed/Suction Drain Al Pimple 2 Right Abdomen) 0   0        Shift Total  (mL/kg) 1530  (17.7) 1040  (12) 1580  (18.3) 4150  (48)       NET -365 10 -1580 -1935       Weight (kg) 86.5 86.5 86.5 86.5 86.5 86.5 86.5 86.5       Physical exam:  BP: (105-121)/(68-76)   Temp:  [36.5 C (97.7 F)-36.9 C (98.4 F)]   Temp src:  [-]   Heart Rate:  [90-99]   Resp:  [16-18]   SpO2:  [93 %-98 %]     Gen: Resting comfortably, NAD  Resp: CTAB  CV: RRR  Abd: Hypoactive BS,   soft, nt/nd  Incision c/d/i with staples  GU: Urostomy beefy red productive of clear yellow urine, bilateral stents in place  Ext: [facility]      Recent Labs  Lab 08/30/12  0351 08/29/12  0206 08/28/12  0456   WBC 5.1 9.3 9.2   Hemoglobin 7.7* 9.0* 8.9*   Hematocrit 23* 28* 28*   Platelets 73* 98* 115*  Recent Labs  Lab 08/30/12  0351 08/29/12  0206 08/28/12  0456   Sodium 138 137 141   Potassium 2.7* 3.1* 3.3   CO2 24 23 21    UN 3* 4* 5*   Creatinine 0.44* 0.46* 0.50*   Glucose 332* 107* 82   Calcium 7.2* 7.8* 7.9*         Recent Labs  Lab 08/26/12  1745   INR 1.2       UA:No results found for this basename: UCOL, UASG, UAPH, UBLD, UAGLU, PUKET, PULEU, UNITR, UBAC, UWBC, 2, URBC, UABET, USQUA, PUPT, UPREG,  in the last 168 hours    Cultures:  Aerobic Culture   Date Value Range Status   08/26/2012 .   Final   08/22/2012 Lab Cancel   Final   08/22/2012 Proteus mirabilis   Final       >100,000/ml   08/22/2012 Klebsiella pneumoniae   Final      20,000/ml   08/22/2012 Escherichia coli   Final      >100,000/ml   08/22/2012 Enterococcus species   Final       70,000/ml      Uncomplicated urinary tract infections caused by      Enterococcus are usually responsive to Ampicillin. Serious      infections caused by Enterococcus are usually treated with      combination therapy. Consult Infectious Diseases.      .      Vancomycin restricted at [facility]:  Requires approval of      Infectious Diseases (pager [phone]) or initial order      will be discontinued at 72 hours.      .        Bacterial Blood Culture   Date Value Range Status   05/18/2012 .   Final   05/17/2012 .   Final   12/08/2011 .   Final         * Portable Chest Standard Ap Single View    08/27/2012  IMPRESSION:   Lines and tubes as positioned above. Improved aeration.   END REPORT        Assessment and plan:  26 year old female with neurogenic bladder POD#4 s/p ileal conduit    F/E/N: Advance to regular diet, HLIV, continue with antiemetics  Pain Control: Roxicet, toradol  CV:  No active issues  Resp: IS/pulm toilet  GU: Good UOP, creatinine stable. F/u JP creatinine  Heme: Hct 23, patient asymptomatic and HD stable. Will continue to monitor. Platelets continue to decrease, 73 from 98. Patient not on heparin product. Will continue to monitor for now  ID: No active issues  DVT Prophylaxis: Fondaparinux  PT/OT/Activity: OOB as tolerated  Ostomy care today with mom  Dispo: Discharge planning, possibly home tomorrow    Denice Bors, MD 08/30/2012 6:20 AM  I saw and evaluated the patient. I agree with the resident's/fellow's findings and plan of care as documented above. Details of my evaluation are as follows:     Afebrile. Wound OK. Labs OK except Hct 23 from 28. No bleeding out drain site and abdomen not distended with good bowel sounds (bowel movement yesterday with suppository). Stoma fine. Family and aide will learn stomal care today. Drain creatinine is pending to see if it can be removed. Given cessation of antibiotics 2 days ago, and urine at surgery was sterile, there is no sign of pyovesica currently.    Deitra Mayo, MD

## 2012-08-30 LAB — CBC AND DIFFERENTIAL
Baso # K/uL: 0 10*3/uL (ref 0.0–0.1)
Baso # K/uL: 0 10*3/uL (ref 0.0–0.1)
Basophil %: 0 % (ref 0.1–1.2)
Basophil %: 0.2 % (ref 0.1–1.2)
Eos # K/uL: 0.1 10*3/uL (ref 0.0–0.4)
Eos # K/uL: 0.1 10*3/uL (ref 0.0–0.4)
Eosinophil %: 2.2 % (ref 0.7–5.8)
Eosinophil %: 2.1 % (ref 0.7–5.8)
Hematocrit: 23 % — ABNORMAL LOW (ref 34–45)
Hematocrit: 27 % — ABNORMAL LOW (ref 34–45)
Hemoglobin: 7.7 g/dL — ABNORMAL LOW (ref 11.2–15.7)
Hemoglobin: 8.9 g/dL — ABNORMAL LOW (ref 11.2–15.7)
Lymph # K/uL: 2.3 10*3/uL (ref 1.2–3.7)
Lymph # K/uL: 2.6 10*3/uL (ref 1.2–3.7)
Lymphocyte %: 46 % (ref 19.3–51.7)
Lymphocyte %: 45.9 % (ref 19.3–51.7)
MCV: 92 fL (ref 79–95)
MCV: 92 fL (ref 79–95)
Mono # K/uL: 0.3 10*3/uL (ref 0.2–0.9)
Mono # K/uL: 0.4 10*3/uL (ref 0.2–0.9)
Monocyte %: 6.7 % (ref 4.7–12.5)
Monocyte %: 7.5 % (ref 4.7–12.5)
Neut # K/uL: 2.3 10*3/uL (ref 1.6–6.1)
Neut # K/uL: 2.5 10*3/uL (ref 1.6–6.1)
Platelets: 73 10*3/uL — ABNORMAL LOW (ref 160–370)
Platelets: 85 10*3/uL — ABNORMAL LOW (ref 160–370)
RBC: 2.5 MIL/uL — ABNORMAL LOW (ref 3.9–5.2)
RBC: 3 MIL/uL — ABNORMAL LOW (ref 3.9–5.2)
RDW: 14.8 % — ABNORMAL HIGH (ref 11.7–14.4)
RDW: 14.6 % — ABNORMAL HIGH (ref 11.7–14.4)
Seg Neut %: 45.1 % (ref 34.0–71.1)
Seg Neut %: 44.3 % (ref 34.0–71.1)
WBC: 5.1 10*3/uL (ref 4.0–10.0)
WBC: 5.7 10*3/uL (ref 4.0–10.0)

## 2012-08-30 LAB — BASIC METABOLIC PANEL
Anion Gap: 7 (ref 7–16)
CO2: 24 mmol/L (ref 20–28)
Calcium: 7.2 mg/dL — ABNORMAL LOW (ref 8.8–10.2)
Chloride: 107 mmol/L (ref 96–108)
Creatinine: 0.44 mg/dL — ABNORMAL LOW (ref 0.51–0.95)
GFR,Black: 160 *
GFR,Caucasian: 139 *
Glucose: 332 mg/dL — ABNORMAL HIGH (ref 60–99)
Lab: 3 mg/dL — ABNORMAL LOW (ref 6–20)
Potassium: 2.7 mmol/L — CL (ref 3.3–5.1)
Sodium: 138 mmol/L (ref 133–145)

## 2012-08-30 LAB — CREATININE, BODY FLUID: Creat,FL: 0.43 mg/dL

## 2012-08-30 LAB — POTASSIUM: Potassium: 4.4 mmol/L (ref 3.3–5.1)

## 2012-08-30 LAB — PHOSPHORUS: Phosphorus: 2.7 mg/dL (ref 2.7–4.5)

## 2012-08-30 LAB — MAGNESIUM: Magnesium: 1.4 mEq/L (ref 1.3–2.1)

## 2012-08-30 MED ORDER — POT & SOD AC PHOSPHATES 305-700 MG PO TABS *I*
2.0000 | ORAL_TABLET | Freq: Two times a day (BID) | ORAL | Status: AC
Start: 2012-08-30 — End: 2012-08-31
  Administered 2012-08-30 – 2012-08-31 (×4): 2 via ORAL
  Filled 2012-08-30 (×4): qty 2

## 2012-08-30 MED ORDER — DOCUSATE SODIUM 100 MG PO CAPS *I*
100.0000 mg | ORAL_CAPSULE | Freq: Two times a day (BID) | ORAL | Status: DC
Start: 2012-08-30 — End: 2012-09-01
  Administered 2012-08-30 – 2012-08-31 (×4): 100 mg via ORAL
  Filled 2012-08-30 (×4): qty 1

## 2012-08-30 MED ORDER — POTASSIUM CHLORIDE CRYS CR 20 MEQ PO TBCR *I*
40.0000 meq | ORAL_TABLET | Freq: Three times a day (TID) | ORAL | Status: DC
Start: 2012-08-30 — End: 2012-09-01
  Administered 2012-08-30 – 2012-08-31 (×3): 40 meq via ORAL
  Filled 2012-08-30 (×10): qty 2

## 2012-08-30 MED ORDER — POTASSIUM CHLORIDE 20 MEQ/50ML IV SOLN *I*
10.0000 meq | INTRAVENOUS | Status: DC
Start: 2012-08-30 — End: 2012-08-30

## 2012-08-30 MED ORDER — POLYETHYLENE GLYCOL 3350 PO PACK 17 GM *I*
17.0000 g | PACK | Freq: Every day | ORAL | Status: DC
Start: 2012-08-30 — End: 2012-09-01
  Administered 2012-08-30 – 2012-08-31 (×2): 17 g via ORAL

## 2012-08-30 MED ORDER — POTASSIUM CHLORIDE 20 MEQ/50ML IV SOLN *I*
20.0000 meq | INTRAVENOUS | Status: AC
Start: 2012-08-30 — End: 2012-08-30
  Administered 2012-08-30 (×4): 20 meq via INTRAVENOUS
  Filled 2012-08-30 (×4): qty 50

## 2012-08-30 MED ORDER — SENNOSIDES 8.6 MG PO TABS *I*
2.0000 | ORAL_TABLET | Freq: Every day | ORAL | Status: DC
Start: 2012-08-30 — End: 2012-09-01
  Administered 2012-08-30 – 2012-08-31 (×2): 2 via ORAL
  Filled 2012-08-30 (×3): qty 2

## 2012-08-30 NOTE — Consults (Signed)
Pt seen w/mother and two private aides at bedside also Dr Lisabeth Pick and Dr. Vilma Meckel.  Pt to start solid foods today, no N/V +Bm per mother.  Complete wafer and pouch change done today w/ mother and aides watching, Pt kept eyes closed or was looking at her phone the entire time.  Stoma is 1 3/8'' in size oval in shape, and is red wet budded and viable. Mucocutaneous sutures and 2 green ureteral stents intact and patent.  Urine is dark amber with mucus/blood clot on stoma.  Pt placed in a 2 1/4'' flat wafer and pouch.  Peristomal skin is intact.  Reviewed antirefluxing mechanism in pouch and how to attach to night drainage or leg bag. Reviewed normal stoma characteristics, importance of drinking fluids to keep kidneys flushed, also shrinkage of stoma over the next several weeks and importance of cutting wafer w/a little "wiggle room" around stoma for a good fit.  WIll meet again w/ Mother and aides tomorrow morning to have aides do hands on care.  Will follow.

## 2012-08-30 NOTE — Progress Notes (Addendum)
Urology Progress Note    Subjective:  No acute events  Pain well controlled  Ostomy nurses worked with her mom and aids  Tolerated regular diet, no nausea or vomiting  No BM yesterday  No cp, sob, fever/chills          . pot & sod phosphates  2 tablet Oral BID   . potassium chloride SA  40 mEq Oral TID   . senna  2 tablet Oral Daily   . docusate sodium  100 mg Oral Q12H SCH   . polyethylene glycol  17 g Oral Daily   . oxyCODONE  5 mg Oral Q4H   . ketorolac  15 mg Intravenous Q6H   . bisacodyl  10 mg Rectal Daily   . fondaparinux  2.5 mg Subcutaneous Daily        metoprolol, sodium chloride, sodium chloride, sodium chloride, sodium chloride, ondansetron    Objective:    Date 08/30/12 0700 - 08/31/12 0659 08/31/12 0700 - 09/01/12 0659   Shift 0700-1459 1500-2259 2300-0659 24 Hour Total 0700-1459 1500-2259 2300-0659 24 Hour Total   I  N  T  A  K  E   P.O. 1200 480  1680          P.O. 1200 480  1680        IV Piggyback 100   100          Volume (mL) (potassium chloride IVPB 20 mEq) 100   100        Shift Total  (mL/kg) 1300  (15) 480  (5.5)  1780  (20.6)       O  U  T  P  U  T   Urine  (mL/kg/hr) 1500  (2.2) 750  (1.1) 900 3150          Output (ml) (Urostomy Ileal conduit RLQ) 1500 [phone]        Drains 110 50 70 230          Output (ml) (Closed/Suction Drain [employer] 1 Left Abdomen) 110 50 70 230        Shift Total  (mL/kg) 1610  (18.6) 800  (9.2) 970  (11.2) 3380  (39.1)       NET -310 -320 -970 -1600       Weight (kg) 86.5 86.5 86.5 86.5 86.5 86.5 86.5 86.5       Physical exam:  BP: (109-141)/(64-94)   Temp:  [36.4 C (97.5 F)-37 C (98.6 F)]   Temp src:  [-]   Heart Rate:  [72-89]   Resp:  [18]   SpO2:  [96 %-99 %]     Gen: Resting comfortably, NAD  Resp: CTAB  CV: RRR  Abd: Hypoactive BS,  soft, nt, mildly distended  Incision c/d/i with staples  GU: Urostomy pink productive of clear yellow urine, bilateral stents in place  JP serosanguinous  Ext: [facility]      Recent Labs  Lab 08/31/12  0148 08/30/12  1211  08/30/12  0351   WBC 5.6 5.7 5.1   Hemoglobin 8.8* 8.9* 7.7*   Hematocrit 27* 27* 23*   Platelets 75* 85* 73*         Recent Labs  Lab 08/31/12  0148  08/30/12  0351 08/29/12  0206   Sodium 139  --  138 137   Potassium 3.5  < > 2.7* 3.1*   CO2 25  --  24 23   UN 4*  --  3* 4*   Creatinine 0.44*  --  0.44* 0.46*   Glucose 78  --  332* 107*   Calcium 8.0*  --  7.2* 7.8*   < > = values in this interval not displayed.      Recent Labs  Lab 08/26/12  1745   INR 1.2       UA:No results found for this basename: UCOL, UASG, UAPH, UBLD, UAGLU, PUKET, PULEU, UNITR, UBAC, UWBC, 2, URBC, UABET, USQUA, PUPT, UPREG,  in the last 168 hours    Cultures:  Aerobic Culture   Date Value Range Status   08/26/2012 .   Final   08/22/2012 Lab Cancel   Final   08/22/2012 Proteus mirabilis   Final       >100,000/ml   08/22/2012 Klebsiella pneumoniae   Final      20,000/ml   08/22/2012 Escherichia coli   Final      >100,000/ml   08/22/2012 Enterococcus species   Final       70,000/ml      Uncomplicated urinary tract infections caused by      Enterococcus are usually responsive to Ampicillin. Serious      infections caused by Enterococcus are usually treated with      combination therapy. Consult Infectious Diseases.      .      Vancomycin restricted at [facility]:  Requires approval of      Infectious Diseases (pager [phone]) or initial order      will be discontinued at 72 hours.      .        Bacterial Blood Culture   Date Value Range Status   05/18/2012 .   Final   05/17/2012 .   Final   12/08/2011 .   Final         No results found.     Assessment and plan:  26 year old female with neurogenic bladder POD#4 s/p ileal conduit    F/E/N: Regular diet, bowel regimen, suppository today  Pain Control: Roxicet  CV: No active issues  Resp: IS/pulm toilet  GU: Good UOP, creatinine stable. JP creatinine consistent with serum, will plan to d/c JP prior to discharge home  Heme: Hct and platelets stable  ID: No active issues  DVT Prophylaxis:  Fondaparinux  PT/OT/Activity: OOB as tolerated  Ostomy care this morning  Dispo: Discharge planning, likely home today    Denice Bors, MD 08/31/2012 6:29 AM        I saw and evaluated the patient. I agree with the resident's/fellow's findings and plan of care as documented above. Details of my evaluation are as follows:     Deitra Mayo, MD      Afebrile. Labs OK. EAVWUJ and stoma OK. Hopefully drain can come out and she can go home today or tomorrow. Labs OK. Vaginal discharge, seems like yeast. Decubiti OK and rectal exam neg. No discharge from urethra and no sign of pyovesica.  Yeast vaginal suppositories.

## 2012-08-30 NOTE — Progress Notes (Signed)
SW informed that pt needs assistance with parking. SW met with pt and family, provided gratis parking form for 6 parking tokens. SW will continue to assist as needed and plan for safe discharge.

## 2012-08-31 ENCOUNTER — Other Ambulatory Visit: Payer: Self-pay | Admitting: Urology

## 2012-08-31 LAB — CBC AND DIFFERENTIAL
Baso # K/uL: 0 10*3/uL (ref 0.0–0.1)
Basophil %: 0.2 % (ref 0.1–1.2)
Eos # K/uL: 0.2 10*3/uL (ref 0.0–0.4)
Eosinophil %: 3.6 % (ref 0.7–5.8)
Hematocrit: 27 % — ABNORMAL LOW (ref 34–45)
Hemoglobin: 8.8 g/dL — ABNORMAL LOW (ref 11.2–15.7)
Lymph # K/uL: 2.5 10*3/uL (ref 1.2–3.7)
Lymphocyte %: 44.5 % (ref 19.3–51.7)
MCV: 91 fL (ref 79–95)
Mono # K/uL: 0.5 10*3/uL (ref 0.2–0.9)
Monocyte %: 8 % (ref 4.7–12.5)
Neut # K/uL: 2.5 10*3/uL (ref 1.6–6.1)
Platelets: 75 10*3/uL — ABNORMAL LOW (ref 160–370)
RBC: 3 MIL/uL — ABNORMAL LOW (ref 3.9–5.2)
RDW: 14.9 % — ABNORMAL HIGH (ref 11.7–14.4)
Seg Neut %: 43.7 % (ref 34.0–71.1)
WBC: 5.6 10*3/uL (ref 4.0–10.0)

## 2012-08-31 LAB — BASIC METABOLIC PANEL
Anion Gap: 8 (ref 7–16)
CO2: 25 mmol/L (ref 20–28)
Calcium: 8 mg/dL — ABNORMAL LOW (ref 8.8–10.2)
Chloride: 106 mmol/L (ref 96–108)
Creatinine: 0.44 mg/dL — ABNORMAL LOW (ref 0.51–0.95)
GFR,Black: 160 *
GFR,Caucasian: 139 *
Glucose: 78 mg/dL (ref 60–99)
Lab: 4 mg/dL — ABNORMAL LOW (ref 6–20)
Potassium: 3.5 mmol/L (ref 3.3–5.1)
Sodium: 139 mmol/L (ref 133–145)

## 2012-08-31 LAB — EKG 12-LEAD
P: 47 degrees
QRS: 35 degrees
Rate: 111 {beats}/min
Severity: BORDERLINE
Severity: BORDERLINE
Statement: BORDERLINE
T: -13 degrees

## 2012-08-31 LAB — MAGNESIUM: Magnesium: 1.4 mEq/L (ref 1.3–2.1)

## 2012-08-31 LAB — PHOSPHORUS: Phosphorus: 4.3 mg/dL (ref 2.7–4.5)

## 2012-08-31 MED ORDER — MICONAZOLE NITRATE 200 MG VA SUPP *WRAPPED*
200.0000 mg | Freq: Every evening | VAGINAL | Status: DC
Start: 2012-08-31 — End: 2012-08-31

## 2012-08-31 MED ORDER — MAGNESIUM HYDROXIDE 400 MG/5ML PO SUSP *I*
30.0000 mL | Freq: Every day | ORAL | Status: DC
Start: 2012-08-31 — End: 2012-09-01
  Administered 2012-08-31: 30 mL via ORAL
  Filled 2012-08-31 (×2): qty 30

## 2012-08-31 MED ORDER — BISACODYL 10 MG RE SUPP *I*
10.0000 mg | Freq: Once | RECTAL | Status: DC
Start: 2012-08-31 — End: 2012-08-31

## 2012-08-31 MED ORDER — FLUCONAZOLE 200 MG PO TABS *I*
200.0000 mg | ORAL_TABLET | Freq: Once | ORAL | Status: AC
Start: 2012-08-31 — End: 2012-08-31
  Administered 2012-08-31: 200 mg via ORAL
  Filled 2012-08-31: qty 1

## 2012-08-31 MED ORDER — METRONIDAZOLE IN NACL 5 MG/ML IV SOLN
500.0000 mg | Freq: Three times a day (TID) | INTRAVENOUS | Status: DC
Start: 2012-08-31 — End: 2012-09-01
  Administered 2012-08-31 – 2012-09-01 (×2): 500 mg via INTRAVENOUS
  Filled 2012-08-31 (×6): qty 100

## 2012-08-31 MED ORDER — BISACODYL 10 MG RE SUPP *I*
10.0000 mg | Freq: Once | RECTAL | Status: AC
Start: 2012-08-31 — End: 2012-08-31
  Administered 2012-08-31: 10 mg via RECTAL

## 2012-08-31 MED ORDER — MICONAZOLE NITRATE 200 MG VA SUPP *WRAPPED*
200.0000 mg | Freq: Every evening | VAGINAL | Status: DC
Start: 2012-08-31 — End: 2012-09-01
  Administered 2012-08-31: 200 mg via VAGINAL
  Filled 2012-08-31 (×2): qty 1

## 2012-08-31 MED ORDER — MICONAZOLE NITRATE 2 % EX POWD *I*
Freq: Two times a day (BID) | CUTANEOUS | Status: DC
Start: 2012-08-31 — End: 2012-08-31

## 2012-08-31 NOTE — Progress Notes (Signed)
Pt's mother called at 57 and expressed concerns about pt's genital hygiene due to odor.  During am care writer noticed white opaque discharge from the vagina.  Florinda Marker, NP notified.  Dr. Lisabeth Pick at bedside and examined pt. Meds ordered for possible vaginal yeast infection. Will continue to monitor.
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
## 2012-08-31 NOTE — Progress Notes (Signed)
Home Health Assessment    Completed by: Mar Daring, RN  Phone: [phone]      Source of Information: medical record      Home Health indicators present: May be discharged with tube, drain, stoma or line        Plan: Home care referral started; agency chosen [facility]      Comments: Will set up safe plan of care for home with SN services to work with ostomy teaching.

## 2012-08-31 NOTE — Consults (Signed)
Patient seen during unit skin rounds. Patient turned with some assistance to R side for assessment of present on admission pressure ulcers.    Sacral- Stage III pressure ulcer, measures approximately 2.5 x 2 x 0.1cm, clean red wound base, with hyperpigmented periwound skin.  R ischial- unstageable pressure ulcer, measures approximately 0.5 x 0.5 x 0.1cm (depth variable due to being in a scar), slough covered wound base, periwound skin macerated at edge and hyperpigmented  R buttock- Stage III pressure ulcer, measures approximately 0.5 x 2 x 0.1cm, clean wound base, with intact periwound skin.    Recommendations:   Cleanse with NS/gauze and cover with Allevyn Gentle foam dressing (2 5x5 dressings) - change PRN when soiled and/or Q3 days. If draining a lot may apply Aquacel to wound base.  Continue with Q2 hour turning and repositioning and offloading of heels from mattress.   Utilize blue waffle cushion while up in the chair - sitting for only an hour at at time.   Continue LAL mattress set for patient's current height and weight.  Today and weekly Prealbumin. Nutrition consult.    Please call with any questions or concerns.   Ceasar Mons, RN, [employer] [phone]

## 2012-08-31 NOTE — Progress Notes (Signed)
Pt seen this am w/her two aides present they worked together to change wafer and pouch. They re-cut a new pattern, measured, cleansed the skin reapplied wafer and pouch w/good technique. They both displayed a comfort with the change.  Will follow.        08/31/12 1000   Urostomy Ileal conduit RLQ   Placement Date/Time: 08/27/12 2000   Pre-existing: Yes  Urostomy Type: Ileal conduit  Location: RLQ  Stoma Size (inches at largest diameter): 1.38 in  Collection Container: Gravity drainage bag  Securement Method: Tape   This assessment was completed by Wound/Ostomy Nurse   Stoma Appearance Red;Viable;Budded   Peristomal Assessment Intact   Treatment Pouch and wafer changed   Urine Color Yellow/straw   Urine Appearance Red flecks   Urine Odor No odor

## 2012-09-01 LAB — CBC AND DIFFERENTIAL
Baso # K/uL: 0 10*3/uL (ref 0.0–0.1)
Basophil %: 0.4 % (ref 0.1–1.2)
Eos # K/uL: 0.2 10*3/uL (ref 0.0–0.4)
Eosinophil %: 3.3 % (ref 0.7–5.8)
Hematocrit: 28 % — ABNORMAL LOW (ref 34–45)
Hemoglobin: 9.3 g/dL — ABNORMAL LOW (ref 11.2–15.7)
Lymph # K/uL: 2.5 10*3/uL (ref 1.2–3.7)
Lymphocyte %: 43.9 % (ref 19.3–51.7)
MCV: 93 fL (ref 79–95)
Mono # K/uL: 0.4 10*3/uL (ref 0.2–0.9)
Monocyte %: 7.7 % (ref 4.7–12.5)
Neut # K/uL: 2.6 10*3/uL (ref 1.6–6.1)
Platelets: 89 10*3/uL — ABNORMAL LOW (ref 160–370)
RBC: 3.1 MIL/uL — ABNORMAL LOW (ref 3.9–5.2)
RDW: 15.2 % — ABNORMAL HIGH (ref 11.7–14.4)
Seg Neut %: 44.7 % (ref 34.0–71.1)
WBC: 5.7 10*3/uL (ref 4.0–10.0)

## 2012-09-01 LAB — BASIC METABOLIC PANEL
Anion Gap: 8 (ref 7–16)
CO2: 25 mmol/L (ref 20–28)
Calcium: 8.4 mg/dL — ABNORMAL LOW (ref 8.8–10.2)
Chloride: 106 mmol/L (ref 96–108)
Creatinine: 0.5 mg/dL — ABNORMAL LOW (ref 0.51–0.95)
GFR,Black: 153 *
GFR,Caucasian: 133 *
Glucose: 78 mg/dL (ref 60–99)
Lab: 5 mg/dL — ABNORMAL LOW (ref 6–20)
Potassium: 4 mmol/L (ref 3.3–5.1)
Sodium: 139 mmol/L (ref 133–145)

## 2012-09-01 LAB — MAGNESIUM: Magnesium: 1.6 mEq/L (ref 1.3–2.1)

## 2012-09-01 LAB — PHOSPHORUS: Phosphorus: 4.3 mg/dL (ref 2.7–4.5)

## 2012-09-01 MED ORDER — METRONIDAZOLE 500 MG PO TABS *I*
500.0000 mg | ORAL_TABLET | Freq: Three times a day (TID) | ORAL | Status: AC
Start: 2012-09-01 — End: 2012-09-06

## 2012-09-01 MED ORDER — OXYCODONE-ACETAMINOPHEN 5-325 MG PO TABS *I*
1.0000 | ORAL_TABLET | ORAL | Status: DC | PRN
Start: 2012-09-01 — End: 2012-09-06

## 2012-09-01 MED ORDER — DOCUSATE SODIUM 100 MG PO CAPS *I*
100.0000 mg | ORAL_CAPSULE | Freq: Two times a day (BID) | ORAL | Status: AC
Start: 2012-09-01 — End: 2013-02-28

## 2012-09-01 MED ORDER — MICONAZOLE NITRATE 200 MG VA SUPP *WRAPPED*
200.0000 mg | Freq: Every evening | VAGINAL | Status: AC
Start: 2012-09-01 — End: 2012-09-04

## 2012-09-01 MED ORDER — OXYCODONE HCL 5 MG PO TABS *I*
5.0000 mg | ORAL_TABLET | ORAL | Status: DC | PRN
Start: 2012-09-01 — End: 2012-09-01

## 2012-09-01 MED ORDER — FONDAPARINUX SODIUM 2.5 MG/0.5ML SC SOLN *I*
2.5000 mg | Freq: Every day | SUBCUTANEOUS | Status: DC
Start: 2012-09-01 — End: 2012-09-16

## 2012-09-01 MED ORDER — CIPROFLOXACIN HCL 500 MG PO TABS *I*
500.0000 mg | ORAL_TABLET | Freq: Two times a day (BID) | ORAL | Status: DC
Start: 2012-09-01 — End: 2012-09-01

## 2012-09-01 MED ORDER — CIPROFLOXACIN HCL 500 MG PO TABS *I*
500.0000 mg | ORAL_TABLET | Freq: Once | ORAL | Status: AC
Start: 2012-09-01 — End: 2012-09-01

## 2012-09-01 MED ORDER — POLYETHYLENE GLYCOL 3350 PO PACK 17 GM *I*
17.0000 g | PACK | Freq: Every day | ORAL | Status: AC
Start: 2012-09-01 — End: 2012-10-01

## 2012-09-01 NOTE — Progress Notes (Signed)
09/01/12   WOCN called by staff to report that patient ans mom have requested to see me.     Mom reports that Cindy Ochoa is lying "on just a regular matttress" at home now because the air mattress they got from [facility] broke and was taken back by [facility].    WOCN call to [facility] to attempt to get an appropriate support surface on the bed at home for this pt with multiple pressure ulcers . Spoke with Cindy Ochoa at [facility] at [phone] ext [phone] and gave [name] the number for [facility] to call and discuss.    Time spent - 30 minutes.

## 2012-09-01 NOTE — Progress Notes (Signed)
Pt and mother both instructed on Arixtra SQ administration daily. Mother is already familiar with injections. Reviewed sx's and symptoms of bleeding or bruising to watch for. We reviewed all meds and d/c paperwork thoroughly. Sharps container and alcohol swabs provided. WOCN gave bag of ostomy supplies. HCR will follow for home care and are aware of d/c today. D/C per w/c Zenaida Niece with her own motorized w/c.

## 2012-09-01 NOTE — Progress Notes (Signed)
JP Drain removed per protocol after bulb cut off of tubing, dry sterile dressing placed on site. Pt tolerated well.     Kamaal Cast RPA-C  Urology PA

## 2012-09-01 NOTE — Progress Notes (Addendum)
Urology Progress Note    Subjective:  No acute events  Pain well controlled  Ostomy nurses working w/ mom and aids  Tolerated regular diet, no nausea or vomiting  No cp, sob, fever/chills          . magnesium hydroxide  30 mL Oral Daily   . miconazole  200 mg Vaginal Nightly   . metroNIDAZOLE  500 mg Intravenous Q8H   . potassium chloride SA  40 mEq Oral TID   . senna  2 tablet Oral Daily   . docusate sodium  100 mg Oral Q12H SCH   . polyethylene glycol  17 g Oral Daily   . oxyCODONE  5 mg Oral Q4H   . ketorolac  15 mg Intravenous Q6H   . fondaparinux  2.5 mg Subcutaneous Daily        metoprolol, sodium chloride, sodium chloride, sodium chloride, sodium chloride, ondansetron    Objective:    Date 08/31/12 0700 - 09/01/12 0659 09/01/12 0700 - 09/02/12 0659   Shift 0700-1459 1500-2259 2300-0659 24 Hour Total 0700-1459 1500-2259 2300-0659 24 Hour Total   I  N  T  A  K  E   P.O. 1200 800  2000          P.O. 1200 800  2000        I.V.  (mL/kg/hr)  25  (0)  25          I.V.  25  25        IV Piggyback  100 100 200          Volume (mL) (metroNIDAZOLE (FLAGYL) IVPB 500 mg)  100 100 200        Shift Total  (mL/kg) 1200  (13.9) 925  (10.7) 100  (1.2) 2225  (25.7)       O  U  T  P  U  T   Urine  (mL/kg/hr) 1025  (1.5) 950  (1.4) 500 2475          Output (ml) (Urostomy Ileal conduit RLQ) 1025 [phone]        Drains 80 40 30 150          Output (ml) (Closed/Suction Drain [employer] 1 Left Abdomen) 80 40 30 150        Shift Total  (mL/kg) 1105  (12.8) 990  (11.4) 530  (6.1) 2625  (30.3)       NET 95 -65 -430 -400       Weight (kg) 86.5 86.5 86.5 86.5 86.5 86.5 86.5 86.5       Physical exam:  BP: (120-151)/(70-98)   Temp:  [36 C (96.8 F)-36.6 C (97.9 F)]   Temp src:  [-]   Heart Rate:  [64-87]   Resp:  [18]   SpO2:  [96 %-99 %]     Gen: Resting comfortably, NAD  Resp: CTAB  CV: RRR  Abd: Hypoactive BS,  soft, nt, mildly distended  Incision c/d/i with staples  GU: Urostomy pink productive of clear yellow urine, bilateral  stents in place  JP serosanguinous  Ext: [facility]      Recent Labs  Lab 09/01/12  0156 08/31/12  0148 08/30/12  1211   WBC 5.7 5.6 5.7   Hemoglobin 9.3* 8.8* 8.9*   Hematocrit 28* 27* 27*   Platelets 89* 75* 85*         Recent Labs  Lab 09/01/12  0156 08/31/12  0148  08/30/12  0351  Sodium 139 139  --  138   Potassium 4.0 3.5  < > 2.7*   CO2 25 25  --  24   UN 5* 4*  --  3*   Creatinine 0.50* 0.44*  --  0.44*   Glucose 78 78  --  332*   Calcium 8.4* 8.0*  --  7.2*   < > = values in this interval not displayed.      Recent Labs  Lab 08/26/12  1745   INR 1.2       UA:No results found for this basename: UCOL, UASG, UAPH, UBLD, UAGLU, PUKET, PULEU, UNITR, UBAC, UWBC, 2, URBC, UABET, USQUA, PUPT, UPREG,  in the last 168 hours    Cultures:  Aerobic Culture   Date Value Range Status   08/26/2012 .   Final   08/22/2012 Lab Cancel   Final   08/22/2012 Proteus mirabilis   Final       >100,000/ml   08/22/2012 Klebsiella pneumoniae   Final      20,000/ml   08/22/2012 Escherichia coli   Final      >100,000/ml   08/22/2012 Enterococcus species   Final       70,000/ml      Uncomplicated urinary tract infections caused by      Enterococcus are usually responsive to Ampicillin. Serious      infections caused by Enterococcus are usually treated with      combination therapy. Consult Infectious Diseases.      .      Vancomycin restricted at [facility]:  Requires approval of      Infectious Diseases (pager [phone]) or initial order      will be discontinued at 72 hours.      .        Bacterial Blood Culture   Date Value Range Status   05/18/2012 .   Final   05/17/2012 .   Final   12/08/2011 .   Final         No results found.     Assessment and plan:  26 year old female with neurogenic bladder POD#6 s/p ileal conduit    F/E/N: Regular diet, bowel regimen, suppository   Pain Control: Roxicet  CV: No active issues  Resp: IS/pulm toilet  GU: Good UOP, creatinine stable. JP creatinine consistent with serum, will plan to d/c JP prior to discharge home  Heme:  Hct and platelets stable  ID: No active issues  DVT Prophylaxis: Fondaparinux  PT/OT/Activity: OOB as tolerated  Ostomy care this morning  Dispo: Discharge planning today    Guadelupe Sabin, DO 09/01/2012 6:00 AM  I saw and evaluated the patient. I agree with the resident's/fellow's findings and plan of care as documented above. Details of my evaluation are as follows:     Afebrile. Wound and stomaOK. Eating. Labs OK. Drain out today. Discharge to RTC in 7-10 days. Stents out then. Macrobid and Cipro on AM of stent removal x 1 only. Vaginal discharge less.    Deitra Mayo, MD

## 2012-09-01 NOTE — Discharge Instructions (Signed)
Brief Summary of Your Hospital Course:  You underwent a ileal loop urinary diversion. You tolerated the procedure well. You were eventually tolerating a clear to regular diet and pain controlled with oral pain medications. You had a JP drain that was removed before going home. You had instruction on how to care for the Ileal loop urinary diversion prior to going home with the ostomy team. You were noted to have a vaginal yeast infection and was started on medications to treat that. You will need to continue those medications once home.  You will need to follow up with Dr. Lisabeth Pick in 7-10 days. Please call his office upon discharge to make an appointment.    What to do after you leave the hospital:    Recommended diet: regular diet, eat 6-7 small meals daily instead of 3 larger meals    Recommended activity:   Resume activity as before admission  Do NOT: Drive a car, drink alcohol, operate machinery, or make important decisions while on narcotics  No heavy lifting (>10lbs) or strenuous activity    Wound Care: OK to shower, no tub bathing / swimming.   Ostomy care per nursing instructions  Please remove your dressing over your incisions in 1-2 days and if no drainage then leave the dressing off.  May apply another dressing if there is drainage but change that daily and as needed to keep dry.  Continue with Allevyn to buttock decubis as before admission.    Pain Management: No ASPIRIN or NSAIDS until cleared by surgeon to resume  Per medication list below  Do not take any Tylenol or Tylenol (acetaminophen) products while on Narcotic pain medication that contains Tylenol (APAP) like Vicodin (Hydrocodone with APAP) or Percocet (Oxycodone with APAP).    Other Instructions: Avoid constipation. Take a stool softener (Colace). Take a laxative if necessary (Milk of Magnesia).  Watch the color of your urine. It is normal to see blood in your urine for up to 6 weeks after your surgery. If the urine becomes pink, increase  the amount of fluids you drink.  Take your antibiotics as prescribed.    If you experience any of these symptoms call Dr. Lisabeth Pick office @ [phone].  Nausea, Vomiting, Diarrhea  Increased redness, drainage or swelling from incision site(s)  Fever of 101 F. or greater  Chills  Difficulty urinating or Poor urinary output  inability to stay hydrated  Decreased urinary output from urinary diversion  Any other major medical concern      If this is an emergency, call 911.

## 2012-09-01 NOTE — Progress Notes (Signed)
Pt seen, mother at bedside. Mother changed wafer and pouch with some verbal cues and hands on.  Pt able to assist with verbal direction.  Pt to be d/c home call to [facility] to refer to d/c program. Supplies at bedside.        09/01/12 1100   Urostomy Ileal conduit RLQ   Placement Date/Time: 08/27/12 2000   Pre-existing: Yes  Urostomy Type: Ileal conduit  Location: RLQ  Stoma Size (inches at largest diameter): 1.38 in  Collection Container: Gravity drainage bag  Securement Method: Tape   This assessment was completed by Wound/Ostomy Nurse   Stoma Appearance Red;Viable;Budded   Peristomal Assessment Intact   Treatment Pouch and wafer changed   Urine Color Amber   Urine Appearance Mucous   Urine Odor No odor

## 2012-09-02 ENCOUNTER — Encounter: Payer: Self-pay | Admitting: Internal Medicine

## 2012-09-02 ENCOUNTER — Telehealth: Payer: Self-pay

## 2012-09-02 NOTE — Letter (Signed)
PATIENT NAMESHELBIA, WAHLERT   DATE OF SERVICE:  09/01/2012  MRN:  [id number]  Page 1  [address]  [place], [id number]  [id number]     September 01, 2012    Andrey Campanile, MD   [address]   [place], [place] [address]    RE:   Cindy Ochoa, Cindy Ochoa   DOB:  January 08, 1986  Unit#:  [id number]    Dear Dr. Daphine Deutscher:     This is just a brief followup of your patient, Cindy Ochoa, who is a C5 quadriplegic with a neurogenic bladder and problems with draining around her catheter and not retaining her catheter in her bladder.  About 5 days ago, she underwent an ileal loop urinary diversion and seems to be doing quite well.  Her urine was sterile at the time of the procedure and while she was on perioperative antibiotics.  They have been discontinued.  She will be going home with stents coming out of her ileal loop which are draining each renal pelvis but hopefully will be removed when she returns to see [place] in about a week.  We will also be taking some of her wound staples out then.  She actually did quite well in and after surgery.       Again, thank you very much for allowing [place] to be involved in the care of your patient.  We will, of course, keep you informed of all urological follow-up.     Sincerely,           Deitra Mayo, MD    EMM/MODL  DD:  09/01/2012 10:23:40  DT:  09/01/2012 10:46:25  Job #:  [id number]    cc: Kennith Gain, NP   [address]   [address]   [place], [place] [address]     Willeen Niece, MD   [address]   [place], [place] [address]     Andrey Campanile, MD   [address]   [place], [place] [address]

## 2012-09-02 NOTE — Telephone Encounter (Signed)
Pt had surgery was given pain med oxycodone 325mg  she is saying is not working in a lot of pain she has apt next wk, need a  Phone call [phone].Marland KitchenMarland Kitchen

## 2012-09-03 LAB — SURGICAL PATHOLOGY

## 2012-09-05 ENCOUNTER — Encounter: Payer: Self-pay | Admitting: Internal Medicine

## 2012-09-06 ENCOUNTER — Other Ambulatory Visit: Payer: Self-pay | Admitting: Urology

## 2012-09-06 ENCOUNTER — Telehealth: Payer: Self-pay | Admitting: Internal Medicine

## 2012-09-06 ENCOUNTER — Telehealth: Payer: Self-pay

## 2012-09-06 MED ORDER — OXYCODONE-ACETAMINOPHEN 5-325 MG PO TABS *I*
1.0000 | ORAL_TABLET | ORAL | Status: DC | PRN
Start: 2012-09-06 — End: 2012-09-19

## 2012-09-06 NOTE — Telephone Encounter (Signed)
Will review with Dr. Julian Reil

## 2012-09-06 NOTE — Telephone Encounter (Signed)
PT NEEDS PAIN MEDS CALLED IN TO [facility], CALL [phone], IF ANY QUESTIONS.Marland KitchenMarland KitchenMarland Kitchen

## 2012-09-06 NOTE — Telephone Encounter (Signed)
[facility] calling to follow up on the order she sent SIM on Friday 9/6 for a power pressure reducing air mattress. Call her back at [phone] x [phone].

## 2012-09-06 NOTE — Telephone Encounter (Signed)
Notified Juanda Crumble that the paperwork is in Dr. Boston Service folder and he will fill this out tomorrow.

## 2012-09-06 NOTE — Telephone Encounter (Signed)
Dr. Molinda Bailiff, PT/HCR calling to let us know he saw the patient and opened her up to PT today but will probably only visit back once more because she's already close to baseline range of motions. He is also opening her up to OT because he thinks that will benefit her more. He also called on pressure release systems and they will be arrive fairly soon. Orders will follow.

## 2012-09-07 ENCOUNTER — Telehealth: Payer: Self-pay

## 2012-09-07 ENCOUNTER — Encounter: Payer: Self-pay | Admitting: Bariatric Surgery

## 2012-09-07 ENCOUNTER — Ambulatory Visit: Payer: Self-pay | Admitting: Bariatric Surgery

## 2012-09-07 VITALS — BP 80/52 | HR 91 | Temp 99.6°F | Resp 20 | Ht 64.0 in | Wt 162.0 lb

## 2012-09-07 DIAGNOSIS — L8993 Pressure ulcer of unspecified site, stage 3: Secondary | ICD-10-CM

## 2012-09-07 NOTE — Progress Notes (Signed)
[facility]  Progress Note      Name: Cindy Ochoa, Cindy Ochoa  MRN: [id number]  DOB: [birth year]      Date of Encounter: 09/07/2012      Medical Providers    Referring: Andrey Campanile, MD   PCP: Andrey Campanile, MD       Chief Complaint      Chief Complaint   Patient presents with   . Wound Check     PU Sacrum          History of Present Ilness   Ms. Mays is a 26 y.o. female. She returns for a follow of visit for management of Right ischial pressure ulcer.    She has a prior history of similar pressure ulcers that have been healed in the past with better moisture control and off-loading.  Patient and aid state that this ulcer just reformed recently.     Patient is scheduled to see her Urologist next week for possible supra-pubic tube vs other definitive procedure for better urinary diversion.    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      Allergies   She is allergic to heparin; nitrofurantoin; and vancomycin.      Medications  Current Outpatient Prescriptions   Medication Sig Note   . oxyCODONE-acetaminophen  (PERCOCET) 5-325 MG per tablet Take 1 tablet by mouth Q4-6H PRN for Pain   MDD 6 tablets    . docusate sodium (COLACE) 100 MG capsule Take 1 capsule (100 mg total) by mouth 2 times daily    . fondaparinux (ARIXTRA) 2.5 MG/0.5ML injection Inject 0.5 mLs (2.5 mg total) into the skin daily    . polyethylene glycol (GLYCOLAX,MIRALAX) powder packet Take 17 g by mouth daily   To prevent constipation    . 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 bisacodyl (DULCOLAX) 10 MG suppository Place 1 suppository (10 mg total) rectally daily as needed for Constipation    . Hydroactive Dressings (TEGADERM HYDROCOLLOID) MISC Apply 1 Film topically daily    . Non-System Medication Allevyn Hydrocellular adhesive dressing  3" x 3" Dx 707.03 Decub ulcer of Sacral region. Disp 3 box of 10  Pt changing dressing daily 03/18/2012: Never got    . Non-System Medication Mattress, [employer]  Use as directed.  Dx: 344.04, 596.54, 707.03, 707.05, 788.33    . Non-System Medication Hospital Bed, Semi-Electric (Head and Foot Adjustments) w/ any type side rail  Dx: 344.04, 596.54, 707.03, 707.05, 788.33    . Skin Protectants, Misc. OINT Apply 1 Tube topically 2 times daily      . Non-System Medication One Hoyer Lift  Diagnosis:  Quadriplegic ICD-9 344.04    .  disposable gloves 1 box Disposable Medium size gloves      No current facility-administered medications for this visit.         Review of Systems   Unchanged from prior visit.    Past Surgical History   She has past surgical history that includes Multiple injuries (04/29/2005 ); craniotomy (04/29/2005); Cervical spine surgery (04/29/2005); Cervical spine surgery (05/04/2005); Tracheostomy tube placement (05/15/05); Gastrostomy tube placement (05/15/05); IVC filter (May 2006 ); Urethral plication (12/19/2009); Left Tibia fracture (06/01/07); PICC insertion > 5 years (08/27/2012); and ileal loop urinary diversion  (08/26/2012 ).      Social History   She reports that she quit smoking about 3 months ago. Her smoking use included Cigarettes. She started smoking about 8 months ago. She has a 1.5 pack-year smoking history. She quit smokeless tobacco use about 7 years ago. She reports that she uses illicit drugs (Marijuana). She reports that she does not drink alcohol.      Family History   Her family history includes Diabetes in her maternal grandmother and mother; High cholesterol in her mother; Osteoarthritis in her maternal grandmother; and Stroke in her maternal grandfather.      Vital Signs  Filed Vitals:    09/07/12 1140   BP: 80/52   Pulse: 91   Temp: 37.6 C (99.6 F)   Resp: 20   Height: 1.626 m (5\' 4" )   Weight: 73.483 kg (162 lb)         Physical Exam   General appearance: alert, well appearing, and in no distress, oriented to person, place, and time, acyanotic, in no respiratory distress and well hydrated.   Heart: normal rate and regular rhythm.    Lungs: unlabored respirations, no intercostal retractions or accessory muscle use.   Extremities: extremities normal, atraumatic, no cyanosis or edema. pulses not examined.   Neurologic: sensation not intact in feet       Wound #6   Location: Right ischium   Size:0.6 cm (length), 0.3 cm (width), 0.3 cm (depth) 1.0 cm     post-debridement - Stage 3   Tunnneling: no   Undermining: no   Drainage: small.   Odor: no   Obvious infection: no   Edema: none   Tissue composition: healthy   Surrounding skin: healthy   Wound debrided today: yes    Wound #7   Location: sacrum   Size:0.6 cm (length), 1.2 cm (width), 0.7 cm (depth) 0.1 cm     post-debridement - Stage 3   Tunnneling: no   Undermining: no   Drainage: small.   Odor: no   Obvious infection: no   Edema: none   Tissue composition: healthy   Surrounding skin: healthy   Wound debrided today: no    Procedure Note:    Patient was sterile prepped and draped as appropriate.    Topical/Local anesthetic:yes    Debridement level:Wound #  6 was debrided of devitalized tissue to muscle with a a surgical curette. Total area debrided: approx. 1 cm sq cm sq.  Good hemostasis obtained. Patient tolerated procedure well.    Pertinent Studies  Prior records reviewed.      Assessment   Ms. Nishiyama is a 26 y.o. female with recurrent Stage 3 pressure ulcer. S/p ileal loop diversion procedure for better urinary drainage.     Plan   Wound debrided today: yes. See procedure note.     Local wound care: Aquacel Ag  Continue skin barrier cream and better offloading to protect  from further breakdown and prevent progression.     Follow up in 3-4 weeks.

## 2012-09-07 NOTE — Telephone Encounter (Signed)
Patient was discharged from hospital 09/01/12 s/p urological procedure.  Unable to speak with patient as she was at Wound Care appointment at the time of this call.  Pt's mother states she is doing well.  Has 2 follow up appointments with Urology  And then has to go to court, so is not able to come in 5-7 day window of time.    Follow up appointment made in our office 09/12/12.    PLAN: pt to call office with complaints or concerns.

## 2012-09-08 ENCOUNTER — Telehealth: Payer: Self-pay | Admitting: Internal Medicine

## 2012-09-08 NOTE — Telephone Encounter (Signed)
Jessica from [facility] called to let Dr. Julian Reil know that she declined OT evaluation today and requested it be moved to next week. HCR will go out next week to do the evaluation.

## 2012-09-12 ENCOUNTER — Telehealth: Payer: Self-pay | Admitting: Internal Medicine

## 2012-09-12 ENCOUNTER — Encounter: Payer: Self-pay | Admitting: Urology

## 2012-09-12 ENCOUNTER — Ambulatory Visit: Payer: Self-pay | Admitting: Urology

## 2012-09-12 ENCOUNTER — Encounter: Payer: Self-pay | Admitting: Gastroenterology

## 2012-09-12 VITALS — BP 121/58 | HR 90 | Temp 97.7°F | Ht 66.0 in | Wt 190.0 lb

## 2012-09-12 DIAGNOSIS — N319 Neuromuscular dysfunction of bladder, unspecified: Secondary | ICD-10-CM

## 2012-09-12 NOTE — Progress Notes (Signed)
The patient arrived in the clinic but was unable to stay long enough to be seen by Dr. Lisabeth Pick.  We will have her return on September 20, and we'll remove her stent, some of the sutures, and examined her lying down.  The patient was instructed to bring extra bags with her for the appointment.  BP 121/58  Pulse 90  Temp(Src) 36.5 C (97.7 F)  Ht 1.676 m (5\' 6" )  Wt 86.183 kg (190 lb)  BMI 30.68 kg/m2  Patient understands and is in agreement.

## 2012-09-12 NOTE — Telephone Encounter (Signed)
Forms are in Dr. Boston Service folder and will be given to him today to sign.

## 2012-09-12 NOTE — Telephone Encounter (Signed)
[facility], is calling to follow up on an order request for a powered pressure reducing air mattress that was sent to the office on 9/6. She also wants to let us know that Dr. Julian Reil is not Medicaid certified, so he will be unable to sign off on the orders, and Dr. Daphine Deutscher will be the one who needs to take care of this. She needed the orders signed and sent back to her office. Call her back at [phone] to discuss.

## 2012-09-16 ENCOUNTER — Ambulatory Visit: Payer: Self-pay | Admitting: Internal Medicine

## 2012-09-16 ENCOUNTER — Ambulatory Visit: Payer: Self-pay | Admitting: Physical Medicine and Rehabilitation

## 2012-09-16 ENCOUNTER — Encounter: Payer: Self-pay | Admitting: Physical Medicine and Rehabilitation

## 2012-09-16 VITALS — BP 114/66 | HR 84 | Temp 97.7°F | Ht 64.0 in | Wt 190.0 lb

## 2012-09-16 DIAGNOSIS — N319 Neuromuscular dysfunction of bladder, unspecified: Secondary | ICD-10-CM

## 2012-09-16 NOTE — Progress Notes (Signed)
Patient ID: Cindy Ochoa is a 26 y.o. female  seen in the urology clinic for postop visit  HPI patient underwent an ileal loop diversion with the bladder remaining intact by Dr. Lisabeth Pick on August 30 for neurogenic bladder dysfunction with inability to keep the Foley catheter in place.  Shawntrice reports that she's been having some abdominal discomfort that feels to be of a spastic nature.  She's taking Percocet 2 tabs every 4-6 hours with only marginal relief.  She is pleased with the functioning of the ileal loop in her bladder compliance appeared to be working well for her.  She has only been moving her bowels every third to fourth day she is feeling uncomfortable.  Her usual bowel pattern is every day to every other day.   She's not been taking the MiraLAX    Easton has Muscle spasm; Quadriparesis At C6; Constipation, chronic; Depression; Autonomic dysfunction; Neurogenic bladder disorder; Decubitus ulcer of sacral region; History of recurrent UTIs; Oculomotor palsy, partial; Thrombocytopenia; Health care maintenance; Pressure ulcer stage III; and Neurogenic bladder on her problem list.  Current Outpatient Prescriptions on File Prior to Visit   Medication Sig Dispense Refill   . oxyCODONE-acetaminophen (PERCOCET) 5-325 MG per tablet Take 1 tablet by mouth Q4-6H PRN for Pain   MDD 6 tablets  30 tablet  0   . docusate sodium (COLACE) 100 MG capsule Take 1 capsule (100 mg total) by mouth 2 times daily  60 capsule  5   . polyethylene glycol (GLYCOLAX,MIRALAX) powder packet Take 17 g by mouth daily   To prevent constipation  510 g  0   . 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 bisacodyl (DULCOLAX) 10 MG suppository Place 1 suppository (10 mg total) rectally daily as needed for Constipation  12 suppository  5   . Hydroactive Dressings (TEGADERM HYDROCOLLOID) MISC Apply 1 Film topically daily  1 each  2   .  Non-System Medication Allevyn Hydrocellular adhesive dressing  3" x 3" Dx 707.03 Decub ulcer of Sacral region. Disp 3 box of 10  Pt changing dressing daily  10 each  0   . Non-System Medication Mattress, [employer]  Use as directed.  Dx: 344.04, 596.54, 707.03, 707.05, 788.33  1 each  0   . Non-System Medication Hospital Bed, Semi-Electric (Head and Foot Adjustments) w/ any type side rail  Dx: 344.04, 596.54, 707.03, 707.05, 788.33  1 each  0   . Skin Protectants, Misc. OINT Apply 1 Tube topically 2 times daily    1 Tube  11   . Non-System Medication One Hoyer Lift  Diagnosis:  Quadriplegic ICD-9 344.04  1 each  0   . disposable gloves 1 box Disposable Medium size gloves  100 each  6     No current facility-administered medications on file prior to visit.     Kelseyann is allergic to heparin; nitrofurantoin; and vancomycin.    Review of Systems   Constitutional: Positive for diaphoresis. Negative for fever, chills, activity change and fatigue.   Gastrointestinal: Positive for abdominal pain and constipation.   Genitourinary:        See HPI             Objective:   Physical Exam   Constitutional: She appears well-developed and well-nourished. No distress.   Young female seated in a wheelchair.   Abdominal: Soft. Bowel sounds  are normal. She exhibits distension. She exhibits no mass.   Genitourinary:   Ileal loop draining yellow urine with mucus shreds.   Skin: Skin is warm. She is diaphoretic.   Ostomies site shows a pink bud.  At the right border, there is an area of approximately 1/2 cm where the skin is opened.  There is no drainage.  The midline abdominal incision is intact without erythema or drainage with staples intact     Procedure: 2 stents were removed from the ileal loop.  Every other staple was removed from abdominal wound.        Assessment:      Ileal loop functioning well nearly postop period.  There is a small open area of the skin adjacent to the ostomy.  Constipation  Abdominal pain        Plan:       We'll plan for followup with Christa Heinsler WOCN next week to evaluate the skin and ostomy site.  One piece appliance was placed today using powder.  The patient demonstrates adequate understanding of care of the ostomy.  Advised to continue with Colace, senna, Dulcolax suppository.  Instructed to use MiraLAX daily until usual bowel function returns.  Instructed that pain will likely improve now that the stents are out.  She should continue to wean off the Percocet.  We'll check CBC and SMA 8 today.  We'll followup next week with Dr. Lisabeth Pick as well.  We have asked the visiting nurses to remove the remainder of the surgical staples next week.    This note was dictated using Animal nutritionist. The document was reviewed but errors may still be present.

## 2012-09-16 NOTE — Patient Instructions (Signed)
Take Miralax daily as directed until your BMs are normal again.

## 2012-09-19 ENCOUNTER — Telehealth: Payer: Self-pay

## 2012-09-19 MED ORDER — OXYCODONE-ACETAMINOPHEN 5-325 MG PO TABS *I*
1.0000 | ORAL_TABLET | ORAL | Status: DC | PRN
Start: 2012-09-19 — End: 2012-12-15

## 2012-09-19 NOTE — Telephone Encounter (Signed)
Pt is out of her pain med would like a script called to [facility],

## 2012-09-19 NOTE — Telephone Encounter (Signed)
Message created in error

## 2012-09-19 NOTE — Telephone Encounter (Signed)
Pt. Called with continued abdominal pain. Will give Percocet 1 tab Q4-6 hours to alternate with ibuprofen. Rx called to pharmacy.

## 2012-09-26 ENCOUNTER — Encounter: Payer: Self-pay | Admitting: Surgery

## 2012-09-26 ENCOUNTER — Encounter: Payer: Self-pay | Admitting: Urology

## 2012-10-03 ENCOUNTER — Other Ambulatory Visit: Payer: Self-pay | Admitting: Internal Medicine

## 2012-10-07 ENCOUNTER — Ambulatory Visit: Payer: Self-pay | Admitting: Bariatric Surgery

## 2012-10-09 ENCOUNTER — Encounter: Payer: Self-pay | Admitting: Gastroenterology

## 2012-10-11 ENCOUNTER — Other Ambulatory Visit: Payer: Self-pay | Admitting: Urology

## 2012-10-11 ENCOUNTER — Telehealth: Payer: Self-pay

## 2012-10-11 ENCOUNTER — Telehealth: Payer: Self-pay | Admitting: Urology

## 2012-10-11 DIAGNOSIS — N390 Urinary tract infection, site not specified: Secondary | ICD-10-CM

## 2012-10-11 NOTE — Telephone Encounter (Signed)
Patient is calling back and would like a call at [phone]

## 2012-10-11 NOTE — Telephone Encounter (Signed)
The home care nurse called me to tell me this patient thinks she has a UTI. She is having some increased sweating. They will send a urine culture after they placed a new urostomy bag. This has been ordered. She will call for culture results.

## 2012-10-11 NOTE — Telephone Encounter (Signed)
Called number provided, pt mother answered the phone, no FYI noted in chart giving permission to speak with pt mother.  Pt mother stated pt is washing up to call her at [phone] in about an hour to obtain permission from pt to speak with mother.

## 2012-10-11 NOTE — Telephone Encounter (Signed)
Per call from mom Denise,patient exposed to std/oral, patient is quadriplegic,need hoyer/wheelchair

## 2012-10-11 NOTE — Telephone Encounter (Signed)
Pt states she has appt on 10/17/12.  Pt has no concerns at this time.  Will file encounter.

## 2012-10-12 ENCOUNTER — Ambulatory Visit
Admit: 2012-10-12 | Discharge: 2012-10-12 | Disposition: A | Discharge status: Home / Self Care | Payer: Self-pay | Source: Ambulatory Visit | Attending: Internal Medicine | Admitting: Internal Medicine

## 2012-10-12 ENCOUNTER — Ambulatory Visit: Payer: Self-pay | Admitting: Bariatric Surgery

## 2012-10-12 ENCOUNTER — Encounter: Payer: Self-pay | Admitting: Bariatric Surgery

## 2012-10-12 VITALS — BP 87/53 | HR 94 | Temp 97.8°F | Resp 18 | Ht 64.0 in | Wt 162.0 lb

## 2012-10-12 NOTE — Progress Notes (Signed)
[facility]  Progress Note      Name: Cindy Ochoa, Cindy Ochoa  MRN: [id number]  DOB: May 16, 1986      Date of Encounter: 10/12/2012      Medical Providers    Referring: Andrey Campanile, MD   PCP: Andrey Campanile, MD       Chief Complaint      Chief Complaint   Patient presents with   . Wound Check     Sacrum pressure ulcer          History of Present Ilness   Cindy Ochoa is a 26 y.o. female. She returns for a follow of visit for management of Right ischial pressure ulcer.    She has a prior history of similar pressure ulcers that have been healed in the past with better moisture control and off-loading.  Patient and aid state that this ulcer just reformed recently.     Patient is scheduled to see her Urologist next week for possible supra-pubic tube vs other definitive procedure for better urinary diversion.    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      Allergies   She is allergic to heparin; nitrofurantoin; and vancomycin.      Medications  Current Outpatient Prescriptions   Medication Sig Note   . ibuprofen  (ADVIL,MOTRIN) 800 MG tablet TAKE 1 TABLET BY MOUTH 3 TIMES PER DAY AS NEEDED PAIN    . oxyCODONE-acetaminophen (PERCOCET) 5-325 MG per tablet Take 1 tablet by mouth Q4-6H PRN for Pain   MDD 6 tablets    . docusate sodium (COLACE) 100 MG capsule Take 1 capsule (100 mg total) by mouth 2 times daily    . 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 bisacodyl (DULCOLAX) 10 MG suppository Place 1 suppository (10 mg total) rectally daily as needed for Constipation    . Hydroactive Dressings (TEGADERM HYDROCOLLOID) MISC Apply 1 Film topically daily    . Non-System Medication Allevyn Hydrocellular adhesive dressing  3" x 3" Dx 707.03 Decub ulcer of Sacral region. Disp 3 box of 10  Pt changing dressing daily 03/18/2012: Never got    . Non-System Medication Mattress, [employer]  Use as directed.  Dx: 344.04, 596.54, 707.03, 707.05, 788.33    . Non-System Medication Hospital Bed, Semi-Electric (Head and Foot Adjustments) w/ any type side rail  Dx: 344.04, 596.54, 707.03, 707.05, 788.33    . Skin Protectants, Misc. OINT Apply 1 Tube topically 2 times daily      . Non-System Medication One Hoyer Lift  Diagnosis:  Quadriplegic ICD-9 344.04    . disposable gloves 1 box Disposable Medium size gloves      No  current facility-administered medications for this visit.         Review of Systems   Unchanged from prior visit.    Past Surgical History   She has past surgical history that includes Multiple injuries (04/29/2005 ); craniotomy (04/29/2005); Cervical spine surgery (04/29/2005); Cervical spine surgery (05/04/2005); Tracheostomy tube placement (05/15/05); Gastrostomy tube placement (05/15/05); IVC filter (May 2006 ); Urethral plication (12/19/2009); Left Tibia fracture (06/01/07); PICC insertion > 5 years (08/27/2012); and ileal loop urinary diversion (08/26/2012 ).      Social History   She reports that she quit smoking about 4 months ago. Her  smoking use included Cigarettes. She started smoking about 10 months ago. She has a 1.5 pack-year smoking history. She quit smokeless tobacco use about 7 years ago. She reports that she uses illicit drugs (Marijuana). She reports that she does not drink alcohol.      Family History   Her family history includes Diabetes in her maternal grandmother and mother; High cholesterol in her mother; Osteoarthritis in her maternal grandmother; and Stroke in her maternal grandfather.      Vital Signs  Filed Vitals:    10/12/12 1023   BP: 87/53   Pulse: 94   Temp: 36.6 C (97.8 F)   Resp: 18   Height: 1.626 m (5\' 4" )   Weight: 73.483 kg (162 lb)         Physical Exam   General appearance: alert, well appearing, and in no distress, oriented to person, place, and time, acyanotic, in no respiratory distress and well hydrated.   Heart: normal rate and regular rhythm.    Lungs: unlabored respirations, no intercostal retractions or accessory muscle use.   Extremities: extremities normal, atraumatic, no cyanosis or edema. pulses not examined.   Neurologic: sensation not intact in feet       Wound #6   Location: Right ischium   Well healed    Wound #7   Location: sacrum   Size:0.6 cm (length), 0.5 cm (width), 1.0 cm (depth) 0.1 cm     post-debridement - same except depth 0.3 cm   Tunnneling: no   Undermining: no   Drainage: small.   Odor: no   Obvious infection: no   Edema: none   Tissue composition: healthy   Surrounding skin: healthy   Wound debrided today: no    Procedure Note:    Patient was sterile prepped and draped as appropriate.    Topical/Local anesthetic:yes    Debridement level:Wound # 6 was debrided of devitalized tissue to subcutaneous layer with a a surgical curette. Total area debrided: approx. 1 cm sq cm sq.  Good hemostasis obtained. Patient tolerated procedure well.    Pertinent Studies  Prior records reviewed.      Assessment   [name] is a 26 y.o. female with well-healing Stage 3 pressure ulcer. S/p ileal loop  diversion procedure for better urinary drainage.     Plan   Wound debrided today: yes. See procedure note.     Local wound care: Continue skin barrier cream and better offloading to protect from further breakdown and prevent progression.     Follow up in 3-4 weeks.

## 2012-10-13 ENCOUNTER — Encounter: Payer: Self-pay | Admitting: Gastroenterology

## 2012-10-14 LAB — AEROBIC CULTURE

## 2012-10-17 ENCOUNTER — Encounter: Payer: Self-pay | Admitting: Obstetrics and Gynecology

## 2012-10-17 ENCOUNTER — Ambulatory Visit: Payer: Self-pay | Admitting: Obstetrics and Gynecology

## 2012-10-17 VITALS — BP 87/53 | Ht 64.0 in | Wt 162.0 lb

## 2012-10-17 DIAGNOSIS — Z113 Encounter for screening for infections with a predominantly sexual mode of transmission: Secondary | ICD-10-CM

## 2012-10-17 DIAGNOSIS — N760 Acute vaginitis: Secondary | ICD-10-CM

## 2012-10-17 DIAGNOSIS — B9689 Other specified bacterial agents as the cause of diseases classified elsewhere: Secondary | ICD-10-CM

## 2012-10-17 DIAGNOSIS — Z30017 Encounter for initial prescription of implantable subdermal contraceptive: Secondary | ICD-10-CM | POA: Insufficient documentation

## 2012-10-17 DIAGNOSIS — IMO0001 Reserved for inherently not codable concepts without codable children: Secondary | ICD-10-CM

## 2012-10-17 LAB — POCT URINE PREGNANCY: Lot #: 709005

## 2012-10-17 MED ORDER — ETONOGESTREL 68 MG SC IMPL *I*
68.0000 mg | DRUG_IMPLANT | Freq: Once | SUBCUTANEOUS | Status: AC
Start: 2012-10-17 — End: 2012-10-17

## 2012-10-17 MED ORDER — METRONIDAZOLE 500 MG PO TABS *I*
500.0000 mg | ORAL_TABLET | Freq: Two times a day (BID) | ORAL | Status: AC
Start: 2012-10-17 — End: 2012-10-24

## 2012-10-17 NOTE — Procedures (Signed)
Nexplanon Insertion Note    Cindy Ochoa is a 26 y.o. female, G1P1001 with a last period of 09/22/2012.    Her current method of birth control is abstinence and prior methods include: condoms: 100% of the time and Depo-Provera injections. She states her last intercourse was a few months ago.  Patient now requests insertion of Nexplanon for contraception.    UPT result: negative     Consent:  The procedure risks, benefits, complications and possible alternatives were discussed with the patient. All questions were answered prior to the patient signing the informed consent.    Pre-Procedural Time Out:  10/17/2012                                         12:55 PM    Correct Procedure: Yes  Correct Patient: (use 2 Identifiers) Yes  Correct Site: Yes  Site marked: Yes  Correct Patient Position: Yes  Appropriate Hand Hygiene Used: Yes  List Any Participants Involved in Time-Out: patient,  Gloriann Loan, NP  Availability of correct implants and any special equipment: Yes    Procedure Details  The patient's LEFT  upper arm was prepped with povidone iodine and the insertion site 8-10 cm proximal to the medial epicondyl was injected with approximately 3 cc of 1% lidocaine. The Nexplanon device was inserted subdermally in the crease between the biceps and triceps muscles in the usual manner without difficulty.  Patient and provider palpated the rod just under the skin. A sterile dressing was applied. Patient tolerated the procedure well. Blood loss was minimal.    Lot #: [id number]  Expiration date: 10/15  Removal date: 10/18/15  Post procedure pain rated as 0  The patient did not received any pain medication in the office.     Assessment:   Contraceptive management with Nexplanon contraceptive insertion.    Plan:  Post insertion instructions were reviewed with the patient and written information was also provided.    The plan was reviewed with the patient including:  - Her arm will be achy for about 4 days.  - Her arm  will have mild swelling and a bruise as it heals.  - Keep the dressing on for a day.  - Keep the area clean and dry.  - No heavy lifting with that arm for a day.  - Take acetaminophen (Tylenol) or ibuprofen (Motrin, Advil) for the soreness  - The patient will follow up 6 weeks  - Other instructions: condom use for the next week.     She was advised to call if she has any of the following:  - fever  - excessive swelling at the site  - severe pain in her arm  - bright redness at the insertion site  All questions were answered and the patient stated a good understanding of instructions.  Condom use for the next week or abstinence.  RTC 6 weeks for AGY and nexplanon f/u

## 2012-10-17 NOTE — Patient Instructions (Addendum)
AFTER YOUR IMPLANT INSERTION      1. Your arm will be achy for about 4 days.     2. Your arm will have mild swelling and a bruise as it heals.    3. Keep the dressing on for 1 day.    4. Keep the area clean and dry.    5. Do not do any heavy lifting with that arm for 1 day.    6. Take Acetaminophen (Tylenol) or Ibuprofen (Motrin, Advil) for the soreness.    7. Call if you have any of the following:     Fever   Excessive swelling at the site   Severe pain in your arm   Bright redness at the site              Reviewed 09/2010  IMPLANON SINGLE-ROD CONTRACEPTIVE IMPLANT     What is the single-rod contraceptive implant?    The single-rod contraceptive implant is a type of birth control.  The implant is a small tube made of plastic about the size of a matchstick.  It contains a hormone called etonogestrel (et-oh-no-JES-trel) that prevents users form getting pregnant.  In the [place], the single-rod implant often goes by it's brand name, Implanon.    How safe and effective is the single-rod implant?  [employer] has said that implants are one of the safest and most effective forms of birth control available.  Even though the single-rod implant is new to the [place], more than 60 million women in the worked have used similar implants.  One of the main reasons the single-rod implant works so well is because users don't have to remember to do something about their birth control every day or every time they have sex.    How does the single-rod implant work?  After the implant is placed in your upper arm, it will slowly release the hormone into your body.  This hormone does several things to keep users from getting pregnant:    It stops eggs from leaving the ovaries.    It makes the mucus around the cervix thick.  This keeps sperm from getting to the egg.    It makes the lining of the uterus thin. This keeps an egg from attaching to the uterus.     What are the benefits of using the  single-rod implant?    You do not have to think about your birth control every day or every time you have sex.    Once it is inserted, it works for 3 years.    If you want to stop using it, it can be removed at any time.    You can use it if you are breast feeding.    It does not contain estrogen, a hormone that some women can't use in birth control.    What are the downsides of using the single-rod implant?  Like all medicines, the implant can have some side effects.  For example, women who use the single-rod implant may have:    Irregular bleeding    Heavy and/or longer periods    Periods that are lighter and occur less often    No periods at all  These changes do not mean that something is wrong.  While using the implant, if your period stops, it does not necessarily mean that you are pregnant.  Rarely, other side effects may occur.  Talk about these with your health care provider so you know what  to expect and what to do if they occur.  The single-rod implant does not protect you from HIV or other sexually transmitted infections (STIs).  Use a condom to protect yourself from STIs.      Who should NOT use the single-rod implant?  Only trained health care providers (including doctors, nurses, and nurse midwives) can insert or remove the single-rod implant.  If you health care provider isn't trained to insert an implant, ask for a referral to someone who is trained.    How is the single-rod implant inserted?  The implant is inserted underneath the skin of your arm.  It takes less than 1 minute to insert the implant.  Your health care provider will numb your skin, and then use a thin applicator to insert the implant under your skin.  The small puncture where it is inserted is covered with a bandage and will heal in a few days.  After the implant is in place, you will be able to feel it but not see it.  Make sure you can feel the implant in your arm before you leave your health care provider's office.    Can I get  the single-rod implant at any time?  The timing for putting in the implant is very important.  Tell your health care provider when you had your last period.    Before the implant is inserted, you may have to take a pregnancy test.   After it's inserted, you should use a backup method of birth control for 7 days.  This will prevent you from getting pregnant while your body adjusts to the implant.     What if I want to stop using the single-rod implant?  You can use the implant for 3 years before having it removed.  But, if you want to become pregnant or switch to another type of birth control, you can do so at any time.  You can make an appointment with a health care provider to have it removed.  Removal takes about 3 minutes.   Once the implant is taken out, you should be able to get pregnant right away.  If you don't want to become pregnant after the implant is removed, you will have to start using another type of birth control for 7 days before it is removed.     Can I keep using the single-rod implant after 3 years?  The single-rod implant must be removed after 3 years because the hormone supplying will run out.  If you want to keep using this method, you can have another implant inserted when the old implant is removed.     Can I use the single-rod implant when I am breast feeding?  Yes, you can use the implant while breast-feeding if 4 weeks have passed since you had your baby.  A small amount of the hormone gets into your breast milk, but it will not harm your baby.    Are there any risks from the procedure I need to know about?  You may feel pain, soreness or have swelling or bruising where the implant was placed.  You also may have a small scar at the insertion site.   Although it is rare, infection is possible.    Very rarely, there are difficulties with the implant. It can be inserted the wrong way or can break, which makes it hard to remove.  The implant may move under skin, which also makes it difficult  to remove.  You  may be asked to have an x-ray to see the implant.         Reviewed 09/2010  What is bacterial vaginosis?  Bacterial Vaginosis (BV) is the name of a condition in women where the normal balance of bacteria in the vagina is disrupted and replaced by an overgrowth of certain bacteria. It is sometimes accompanied by discharge, odor, pain, itching, or burning.    How common is bacterial vaginosis?  Bacterial Vaginosis (BV) is the most common vaginal infection in women of childbearing age. In the [place], BV is common in pregnant women.    How do people get bacterial vaginosis?  The cause of BV is not fully understood. BV is associated with an imbalance in the bacteria that are normally found in a woman's vagina. The vagina normally contains mostly "good" bacteria, and fewer "harmful" bacteria. BV develops when there is an increase in harmful bacteria.   Not much is known about how women get BV. There are many unanswered questions about the role that harmful bacteria play in causing BV. Any woman can get BV. However, some activities or behaviors can upset the normal balance of bacteria in the vagina and put women at increased risk including:   . Having a new sex partner or multiple sex partners   . Douching  It is not clear what role sexual activity plays in the development of BV. Women do not get BV from toilet seats, bedding, swimming pools, or from touching objects around them. Women who have never had sexual intercourse may also be affected.    What are the signs and symptoms of bacterial vaginosis?  Women with BV may have an abnormal vaginal discharge with an unpleasant odor. Some women report a strong fish-like odor, especially after intercourse. Discharge, if present, is usually white or gray; it can be thin. Women with BV may also have burning during urination or itching around the outside of the vagina, or both. However, most women with BV report no signs or symptoms at all.    What are the  complications of bacterial vaginosis?   In most cases, BV causes no complications. But there are some serious risks from BV including:   . Having BV can increase a woman's susceptibility to HIV infection if she is exposed to the HIV virus.  . Having BV increases the chances that an HIV-infected woman can pass HIV to her sex partner.  . Having BV has been associated with an increase in the development of an infection following surgical procedures such as a hysterectomy or an abortion.  . Having BV while pregnant may put a woman at increased risk for some complications of pregnancy, such as a preterm delivery.  . BV can increase a woman's susceptibility to other STDs, such as herpes simplex virus (HSV), chlamydia and gonorrhea.    How does bacterial vaginosis affect a pregnant woman and her baby?   Pregnant women with BV more often have babies who are born premature or with low birth weight (low birth weight is less than 5.5 pounds).  The bacteria that cause BV can sometimes infect the uterus (womb) and fallopian tubes (tubes that carry eggs from the ovaries to the uterus). This type of infection is called pelvic inflammatory disease (PID). PID can cause infertility or damage the fallopian tubes enough to increase the future risk of ectopic pregnancy and infertility. Ectopic pregnancy is a life-threatening condition in which a fertilized egg grows outside the uterus, usually in a fallopian  tube which can rupture.     How is bacterial vaginosis diagnosed?   A health care provider must examine the vagina for signs of BV and perform laboratory tests on a sample of vaginal fluid to look for bacteria associated with BV.    What is the treatment for bacterial vaginosis?  Although BV will sometimes clear up without treatment, all women with symptoms of BV should be treated to avoid complications. Female partners generally do not need to be treated. However, BV may spread between female sex partners.  Treatment is especially  important for pregnant women. All pregnant women who have ever had a premature delivery or low birth weight baby should be considered for a BV examination, regardless of symptoms, and should be treated if they have BV. All pregnant women who have symptoms of BV should be checked and treated.  Some physicians recommend that all women undergoing a hysterectomy or abortion be treated for BV prior to the procedure, regardless of symptoms, to reduce their risk of developing an infection.  BV is treatable with antibiotics prescribed by a health care provider. Two different antibiotics are recommended as treatment for BV: metronidazole or clindamycin. Either can be used with non-pregnant or pregnant women, but the recommended dosages differ. Women with BV who are HIV-positive should receive the same treatment as those who are HIV-negative.  BV can recur after treatment.    How can bacterial vaginosis be prevented?   BV is not completely understood by scientists, and the best ways to prevent it are unknown. However, it is known that BV is associated with having a new sex partner or having multiple sex partners.  The following basic prevention steps can help reduce the risk of upsetting the natural balance of bacteria in the vagina and developing BV:  . Be abstinent.  . Limit the number of sex partners.  . Do not douche.  . Use all of the medicine prescribed for treatment of BV, even if the signs and symptoms go away.    FOR MORE INFORMATION:   Division of STD Prevention (DSTDP) Centers for Disease Control and Prevention  [employer]Apps.co.za     CDC-INFO Contact Center 1-800-CDC-INFO (734)830-4707)   email: cdcinfo@cdc .gov     Website: [employer]Ex[employer].com.br     Reviewed 10/2011Flagyl                                                  Metronidazole (By mouth)  Metronidazole (met-roe-NYE-da-zole)  Treats infections. Belongs to a class of drugs called antibiotics.   Brand Name(s):Helidac Therapy , Flagyl , Flagyl 375 , Flagyl ER  There may  be other brand names for this medicine.  When This Medicine Should Not Be Used:  You should not use this medicine if you have ever had an allergic reaction to metronidazole.   How to Use This Medicine:  Tablet, Capsule, Long Acting Tablet, Liquid  Your doctor will tell you how much medicine to take and how often.   You may take the capsule, tablet, or oral liquid with food or milk to avoid stomach upset.   It is best to take the extended-release tablet on an empty stomach, one hour before you eat a meal or two hours after a meal.   Swallow the extended-release tablet whole. Do not crush, break, or chew it.   Measure the oral liquid  medicine with a marked measuring spoon, oral syringe, or medicine cup.   Take your medicine as scheduled until it is all gone, even if your symptoms go away. If you stop taking metronidazole too soon, your infection may return.   If you do not get better in a few days or if your symptoms get worse, call your doctor.  If a dose is missed:  Take the missed dose as soon as possible.   If it is nearly time for your next dose, wait until then to take the medicine and skip the missed dose.   You should not use two doses at one time.  How to Store and Dispose of This Medicine:  Keep the medicine at room temperature in a tightly closed container, away from heat, direct light, and moisture.   Ask your pharmacist, doctor, or health caregiver about the best way to dispose of any outdated medicine or medicine no longer needed.   Keep all medicine away from children.  Drugs and Foods to Avoid:  Ask your doctor or pharmacist before using any other medicine, including over-the-counter medicines, vitamins, and herbal products.  Do not drink alcohol or take medicine with alcohol in it, such as cold or cough medicines. Wait at least one day after stopping metronidazole before drinking alcohol.   Make sure your doctor knows if you are taking blood thinners such as Coumadin, disulfiram (Antabuse),  phenobarbital (Donnatal), phenytoin (Dilantin), or cimetidine (Tagamet).  Warnings While Using This Medicine:  If you are pregnant or breastfeeding, talk to your doctor before taking this medicine.   Check with your doctor before taking metronidazole if you have liver or heart disease, a nerve disease (such as epilepsy), or a blood disorder.   If you are taking this medicine for a genital infection, make sure that your sexual partner also gets treated, even if he or she has no symptoms.   Because this medicine makes some people dizzy or lightheaded, be careful when driving a car or using machinery.   This medicine may turn your urine a reddish-brown color. This is normal.  Possible Side Effects While Using This Medicine:  Call your doctor right away if you notice any of these side effects:  Skin rash or hives   Unexplained sore throat or fever   Joint pain   Tingling, pain, or weakness in hands or feet  If you notice these less serious side effects, talk with your doctor:  Mild diarrhea, nausea, vomiting, stomach pain   Dizziness or lightheadedness   Dry mouth, metallic taste   Pain during sex or when going to the bathroom (urinating)   Vaginal swelling, itching, or discharge   Loss of appetite  If you notice other side effects that you think are caused by this medicine, tell your doctor.  Call your doctor for medical advice about side effects. You may report side effects to FDA at 1-800-FDA-1088
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
## 2012-10-19 ENCOUNTER — Telehealth: Payer: Self-pay | Admitting: Obstetrics and Gynecology

## 2012-10-19 ENCOUNTER — Encounter: Payer: Self-pay | Admitting: Gastroenterology

## 2012-10-19 DIAGNOSIS — A749 Chlamydial infection, unspecified: Secondary | ICD-10-CM

## 2012-10-19 HISTORY — DX: Chlamydial infection, unspecified: A74.9

## 2012-10-19 LAB — CHLAMYDIA PLASMID DNA AMPLIFICATION: Chlamydia Plasmid DNA Amplification: POSITIVE

## 2012-10-19 LAB — N. GONORRHOEAE DNA AMPLIFICATION: N. gonorrhoeae DNA Amplification: 0

## 2012-10-19 MED ORDER — AZITHROMYCIN 500 MG PO TABS *I*
1000.0000 mg | ORAL_TABLET | Freq: Once | ORAL | Status: AC
Start: 2012-10-19 — End: 2012-10-19

## 2012-10-19 NOTE — Telephone Encounter (Signed)
Spoke with patient and advised her of +chlamydia cx.  Advised patient to inform  partner, use condoms until both treated and cleared.  Patient to proceed to confirmed pharmacy to pick up medication.  Patient has scheduled appt with G. Fluellen on 11/28/2012 and will be retested at that time.  Patient verbalizes understanding.

## 2012-10-19 NOTE — Telephone Encounter (Signed)
Notify patient of + chlamydia cx.  Rx for 1 gram of Zithromax sent to pharmacy on file. Reviewed partner tx,.condom use.  She planned to schedule AGY in 6 weeks as she had nexplanon placed and will re screen with Cx at that time. (quadraplegic)

## 2012-10-20 ENCOUNTER — Encounter: Payer: Self-pay | Admitting: Urology

## 2012-10-20 ENCOUNTER — Encounter: Payer: Self-pay | Admitting: Gastroenterology

## 2012-10-24 NOTE — Progress Notes (Signed)
Quick Note:    I spoke Cindy Ochoa by phone today and she has no fevers or chills and is feeling well. The urine is somewhat dark. Will not treat this positive urine culture as she is asymptomatic. She is encouraged to keep up with her fluid intake. I will followup in urology clinic next week along with the wound ostomy nurse.  ______

## 2012-10-26 ENCOUNTER — Telehealth: Payer: Self-pay

## 2012-10-26 NOTE — Telephone Encounter (Signed)
Correction, the appointment is with Lanier Ensign and Christa Heinsler on 11/4. (Dr. Lisabeth Pick is the attending.)

## 2012-10-26 NOTE — Telephone Encounter (Signed)
Asked patient to call Dr. Donnajean Lopes office for her follow up appointment information.  She's scheduled to see Dr. Lisabeth Pick & Christa Heinsler on 11/4 at 10:30am.

## 2012-10-26 NOTE — Telephone Encounter (Signed)
I'm also mailing her this information today.

## 2012-10-31 ENCOUNTER — Encounter: Payer: Self-pay | Admitting: Surgery

## 2012-10-31 ENCOUNTER — Encounter: Payer: Self-pay | Admitting: Physical Medicine and Rehabilitation

## 2012-10-31 ENCOUNTER — Ambulatory Visit: Payer: Self-pay | Admitting: Physical Medicine and Rehabilitation

## 2012-10-31 ENCOUNTER — Encounter: Payer: Self-pay | Admitting: Gastroenterology

## 2012-10-31 ENCOUNTER — Ambulatory Visit: Payer: Self-pay | Admitting: Surgery

## 2012-10-31 VITALS — BP 100/57 | HR 86 | Wt 190.0 lb

## 2012-10-31 VITALS — BP 100/57 | HR 86 | Ht 64.0 in | Wt 190.0 lb

## 2012-10-31 DIAGNOSIS — Z436 Encounter for attention to other artificial openings of urinary tract: Secondary | ICD-10-CM

## 2012-10-31 DIAGNOSIS — N390 Urinary tract infection, site not specified: Secondary | ICD-10-CM

## 2012-10-31 DIAGNOSIS — N319 Neuromuscular dysfunction of bladder, unspecified: Secondary | ICD-10-CM

## 2012-10-31 MED ORDER — IBUPROFEN 800 MG PO TABS *I*
800.0000 mg | ORAL_TABLET | Freq: Three times a day (TID) | ORAL | Status: DC | PRN
Start: 2012-10-31 — End: 2013-05-02

## 2012-10-31 NOTE — Progress Notes (Deleted)
Subjective:      Patient ID: Cindy Ochoa is a 26 y.o. female    HPI  {Common Hem/Onc SmartLinks:[id number]}    Review of Systems        Objective:     Filed Vitals:    10/31/12 1042   BP: 100/57   Pulse: 86   Height: 1.626 m (5\' 4" )   Weight: 86.183 kg (190 lb)       No results found for this or any previous visit (from the past 72 hour(s)).        Physical Exam    Assessment:     ***    Plan:     ***

## 2012-10-31 NOTE — Progress Notes (Deleted)
Subjective: No complaints  HPI: Patient is neurogenic bladder dysfunction and underwent an ileal loop diversion      Patient ID: Cindy Ochoa is a 26 y.o. 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  Cindy Ochoa  has past surgical history that includes Multiple injuries (04/29/2005 ); craniotomy (04/29/2005); Cervical spine surgery (04/29/2005); Cervical spine surgery (05/04/2005); Tracheostomy tube placement (05/15/05); Gastrostomy tube placement (05/15/05); IVC filter (May 2006 ); Urethral plication (12/19/2009); Left Tibia fracture (06/01/07); PICC insertion > 5 years (08/27/2012); and ileal loop urinary diversion (08/26/2012 ).  Current Outpatient Prescriptions on File Prior to Visit   Medication Sig Dispense Refill   .  oxyCODONE-acetaminophen (PERCOCET) 5-325 MG per tablet Take 1 tablet by mouth Q4-6H PRN for Pain   MDD 6 tablets  30 tablet  0   . docusate sodium (COLACE) 100 MG capsule Take 1 capsule (100 mg total) by mouth 2 times daily  60 capsule  5   . 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 bisacodyl (DULCOLAX) 10 MG suppository Place 1 suppository (10 mg total) rectally daily as needed for Constipation  12 suppository  5   . Hydroactive Dressings (TEGADERM HYDROCOLLOID) MISC Apply 1 Film topically daily  1 each  2   . Non-System Medication Allevyn Hydrocellular adhesive dressing  3" x 3" Dx 707.03 Decub ulcer of Sacral region. Disp 3 box of 10  Pt changing dressing daily  10 each  0   . Non-System Medication Mattress, [employer]  Use as directed.  Dx: 344.04, 596.54, 707.03, 707.05, 788.33  1 each  0   . Non-System Medication Hospital Bed, Semi-Electric (Head and Foot Adjustments) w/ any type side rail  Dx: 344.04, 596.54, 707.03, 707.05, 788.33  1 each  0   . Skin Protectants, Misc. OINT Apply 1 Tube topically 2 times daily    1 Tube  11   . Non-System Medication One Nurse, adult  Diagnosis:  Quadriplegic ICD-9 344.04  1 each  0   . disposable gloves 1 box Disposable Medium size gloves  100 each  6   . ibuprofen (ADVIL,MOTRIN) 800 MG tablet Take 1 tablet (800 mg total) by mouth 3 times daily as needed for Pain  50 tablet  2     No current facility-administered medications on file prior to visit.     Casmira is allergic to heparin; nitrofurantoin; and vancomycin.ppropriate."}    Review of Systems          Objective:   Physical Exam  Assessment:      ***        Plan:      ***

## 2012-10-31 NOTE — Progress Notes (Signed)
Subjective: No complaints     Patient ID: Cindy Ochoa is a 26 y.o. female seen in the urology clinic today     Other  Associated symptoms include diaphoresis and weakness. Pertinent negatives include no chills or fever.    patient has neurogenic bladder dysfunction that had been managed with Foley catheter for many years.  Do to a short urethra and urethral incompetence, she had ongoing problems with urinary leakage through the catheter, causing skin sores.  On August 30 she underwent an ileal loop diversion.  She's been quite pleased with the results.  She reports that the urine occasionally has debris in it.  She denies fevers or chills, she does have diaphoresis often.     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  Cindy Ochoa  has past  surgical history that includes Multiple injuries (04/29/2005 ); craniotomy (04/29/2005); Cervical spine surgery (04/29/2005); Cervical spine surgery (05/04/2005); Tracheostomy tube placement (05/15/05); Gastrostomy tube placement (05/15/05); IVC filter (May 2006 ); Urethral plication (12/19/2009); Left Tibia fracture (06/01/07); PICC insertion > 5 years (08/27/2012); and ileal loop urinary diversion (08/26/2012 ).  Current Outpatient Prescriptions on File Prior to Visit   Medication Sig Dispense Refill   . oxyCODONE-acetaminophen (PERCOCET) 5-325 MG per tablet Take 1 tablet by mouth Q4-6H PRN for Pain   MDD 6 tablets  30 tablet  0   . docusate sodium (COLACE) 100 MG capsule Take 1 capsule (100 mg total) by mouth 2 times daily  60 capsule  5   . 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 bisacodyl (DULCOLAX) 10 MG suppository Place 1 suppository (10 mg total) rectally daily as needed for Constipation  12 suppository  5   . Hydroactive Dressings (TEGADERM HYDROCOLLOID) MISC Apply 1 Film topically daily  1 each  2   . Non-System Medication Allevyn Hydrocellular adhesive dressing  3" x 3" Dx 707.03 Decub ulcer of Sacral region. Disp 3 box of 10  Pt changing dressing daily  10 each  0   . Non-System Medication Mattress, [employer]  Use as directed.  Dx: 344.04, 596.54, 707.03, 707.05, 788.33  1 each  0   . Non-System Medication Hospital Bed, Semi-Electric (Head and Foot Adjustments) w/ any type side rail  Dx: 344.04, 596.54, 707.03, 707.05, 788.33  1 each  0   . Skin Protectants, Misc. OINT Apply 1 Tube topically 2 times daily    1 Tube  11   . Non-System Medication One Hoyer Lift  Diagnosis:  Quadriplegic ICD-9 344.04  1 each  0   .  disposable gloves 1 box Disposable Medium size gloves  100 each  6   . ibuprofen (ADVIL,MOTRIN) 800 MG tablet Take 1 tablet (800 mg total) by mouth 3 times daily as needed for Pain  50 tablet  2      No current facility-administered medications on file prior to visit.     Cindy Ochoa is allergic to heparin; nitrofurantoin; and vancomycin.    Review of Systems   Constitutional: Positive for diaphoresis. Negative for fever and chills.   Genitourinary:        See HPI   Skin:        Reports that skin sores are healing   Neurological: Positive for weakness.   All other systems reviewed and are negative.              Objective:   Physical Exam   Constitutional: She appears well-developed. No distress.   A young black female seated in a wheelchair in no distress   Abdominal: Soft. She exhibits no distension. There is no tenderness.   Ileal loop diversion in right abdomen.  The stoma is pink and healthy.  Prior open skin area to the right of the stoma is now healed.  The urine is yellow with much mucus in it.   Skin: She is not diaphoretic.             Assessment:      Neurogenic bladder with ileal loop diversion        Plan:      Patient was seen with Christa Hensler wound ostomy nurse today.  She will followup for ongoing ostomy care.  We'll followup in 6 months time, checking renal function.    This note was dictated using Animal nutritionist. The document was reviewed but errors may still be present.

## 2012-10-31 NOTE — Patient Instructions (Signed)
Continue drinking at least 6 glasses of liquids every day  Place moldable seal on skin around stoma, then place wafer, pouch  Change appliance every 3 days to prevent leaking  Rinse night bag every morning - drain of urine, flush with warm water, rinse with solution of 1/2 white vinegar & water.  Allow to air dry before re-attaching  Eat protein foods every day for healing
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
## 2012-11-03 ENCOUNTER — Telehealth: Payer: Self-pay

## 2012-11-03 NOTE — Telephone Encounter (Signed)
Needs meds for her uti per mother

## 2012-11-04 ENCOUNTER — Telehealth: Payer: Self-pay

## 2012-11-04 ENCOUNTER — Other Ambulatory Visit: Payer: Self-pay | Admitting: Physical Medicine and Rehabilitation

## 2012-11-04 MED ORDER — SULFAMETHOXAZOLE-TRIMETHOPRIM 800-160 MG PO TABS *I*
1.0000 | ORAL_TABLET | Freq: Two times a day (BID) | ORAL | Status: DC
Start: 2012-11-04 — End: 2012-12-06

## 2012-11-04 NOTE — Telephone Encounter (Signed)
Rx for Septra sent.

## 2012-11-04 NOTE — Telephone Encounter (Signed)
mistake

## 2012-11-11 ENCOUNTER — Encounter: Payer: Self-pay | Admitting: Physical Medicine and Rehabilitation

## 2012-11-11 ENCOUNTER — Ambulatory Visit: Payer: Self-pay | Admitting: Bariatric Surgery

## 2012-11-18 ENCOUNTER — Ambulatory Visit: Payer: Self-pay | Admitting: Bariatric Surgery

## 2012-11-28 ENCOUNTER — Encounter: Payer: Self-pay | Admitting: Obstetrics and Gynecology

## 2012-11-28 ENCOUNTER — Ambulatory Visit: Payer: Self-pay | Admitting: Obstetrics and Gynecology

## 2012-11-28 ENCOUNTER — Encounter: Payer: Self-pay | Admitting: Gastroenterology

## 2012-11-28 VITALS — BP 111/70

## 2012-11-28 DIAGNOSIS — R519 Headache, unspecified: Secondary | ICD-10-CM

## 2012-11-28 DIAGNOSIS — Z0000 Encounter for general adult medical examination without abnormal findings: Secondary | ICD-10-CM

## 2012-11-28 MED ORDER — BUTALBITAL-APAP-CAFFEINE 50-325-40 MG PO TABS *I*
1.0000 | ORAL_TABLET | Freq: Four times a day (QID) | ORAL | Status: DC | PRN
Start: 2012-11-28 — End: 2014-03-22

## 2012-11-28 NOTE — Progress Notes (Signed)
Barriers to education/learning assessment    Person assessed: patient    Factors that affect learning:   Physical: Pain/Discomfort  Emotional: None  Misc:None    Patient has support system for learning:yes  family member, name: mom    Ability/readiness to learn (ability to grasp concepts, respond to questions, follow directions)  Comprehension: Good   Motivation: Eager to learn  Preferred learning method: Reading      Educational background  G[employer]      Nutritional Risk Assessment      Obesity (>30 BMI) There is no height or weight on file to calculate BMI.  There is no weight on file to calculate BMI.       Patient requests nutritional consultation no        Functional Screen:    Any difficulties with the following: getting out of a chair, climbing stairs, walking, bathing, dressing, toileting, cooking, feeding yourself, work or other daily activities? yes (if yes, specify details)    Social History:  - Do you have any history of domestic violence? no  - Do you feel unsafe with your partner? no  - Do you have any issues with transportation, food, housing, financial assistance no  - Do you feel you need to see social work? no    Assessment performed by: Zoila Shutter

## 2012-11-28 NOTE — Patient Instructions (Addendum)
MIRENA IUD (The Levonorgestrel Intrauterine System)    What is the levonorgestrel intrauterine system?  The levonorgestrel intrauterine system (IUS) (Mirena) is a new intrauterine contraceptive approved by the [place] Food and Drug Administration in December 2000. The contraceptive is quite small (about 11/4" tall and wide), made of plastic, and shaped like the letter "T" (see photo). Inside, it contains a synthetic female hormone, levonorgestrel, which is released slowly into the uterus (womb), helping to prevent pregnancy. This hormone acts like the natural hormone progesterone and is widely used in implants and oral contraceptives.    How thoroughly was the product tested?  The levonorgestrel IUS has been available in [place] for more than 10 years. Worldwide, the IUS is available in more than 50 countries. Almost 2 million women have used this intrauterine contraceptive.    How does the system work?  Scientists believe that intrauterine contraceptives work in many ways, all of which are not fully understood. Research suggests that contraceptives placed inside the uterus work mainly by preventing fertilization, interfering with the normal development of the egg and the sperm's ability to reach and penetrate the egg. The hormone in the IUS also helps prevent the release of an egg and makes the cervical mucus thick. Stated another way, this method is a true contraceptive: it prevents pregnancy from occurring.    How effective is the IUS?  The IUS contraceptive is one of the most effective reversible contraceptives on the market today. During the first year of use, only 0.1% of women using the method will become pregnant. Over a period of 5 years, fewer than 1% of users will get pregnant. This makes the levonorgestrel IUS as effective as female and female sterilization--with the advantage of being reversible.    Are there side effects?  Changes in menstrual bleeding patterns are the most common side effects of the IUS.  During the first 3 to 6 months of use, the number of days of bleeding and spotting increases and bleeding patterns become irregular.  After 3 to 6 months, however, bleeding and spotting usually lessen quite a bit. Women may have only 10% of their usual blood loss during their periods. About 20% of women will have no bleeding after 12 months, a side effect many women like. These bleeding changes are not harmful and have the benefit of making women less likely to be anemic. Other side effects include lower abdominal pain or cramping, reported by about 10% of users during the first 3 months. Side effects occurring in fewer than 5% of women include             acne or other skin problems, back pain, breast tenderness, headache, mood changes, and nausea.    What are the advantages of using an intrauterine contraceptive?  Intrauterine contraceptives are safe, effective, easy to use, and less expensive over the long run than most other forms of contraception. Remembering to use the method every day or with every act of sex is not necessary. The levonorgestrel IUS is approved for up to 5 years of use, although it may work even longer.    Does the IUS have any health benefits?  Yes, the levonorgestrel IUS decreases menstrual flow for most women and can help treat those who have heavy bleeding. This contraceptive also greatly reduces menstrual cramps and may help women avoid having their uterus surgically removed (hysterectomy) because of severe bleeding problems.    Who can use the IUS?  The best candidates for the IUS are  women who:  . have a normal uterus;  . do not have any genital tract infection or sexually transmitted diseases (STDs) now or within the past 3 months;   . are at low risk of STDs (in a steady, faithful relationship with one partner who has no infection and no other partners).    What is it like to have an IUS inserted?  It takes about 5 to 10 minutes to put in the IUS. The clinician first makes sure the  woman is not pregnant. Sometimes the IUS will be put in during a woman's period because the clinician knows she is not pregnant at that time. Then, the clinician checks the size, shape, and position of the uterus. An antiseptic is put on the cervix and the IUS is put inside. An inserter holds the IUS flat and closed until it reaches the top of the uterus. Some women may feel mild to moderate cramping. Then, the IUS string is cut short so it does not bother the woman or her partner. Most women have little discomfort wearing an IUS; however, it can take time for your body to adjust. Uterine cramps (like menstrual cramps) or low backache might occur at the time of insertion. An over-the-counter medicine,  such as ibuprofen or naproxen, is usually enough to relieve the pain.    When should I call my clinician?  If you have a fever or chills with pelvic pain or tenderness, severe cramping, or unusual vaginal bleeding, contact your clinician because you may have an infection. A slightly increased risk of infection exists during the first 3 weeks after putting in the IUS. After that, the risk is very low.    Can the IUS fall out of the uterus?  It is possible, although rare, that the IUS can become dislodged. If you or your partner can feel the plastic part of the device, it means your IUS has slipped out of place. If you have any doubts about the presence or position of your contraceptive, use latex condoms for birth control and call the office or clinic for instructions or an examination.     Reviewed 10/2011Breast awareness and self-exam         Beginning in their 20s, women should be told about the benefits and limitations of breast self-exam (BSE). Women should be aware of how their breasts normally look and feel and report any new breast changes to a health professional as soon as they are found. Finding a breast change does not necessarily mean there is a cancer.     A woman can notice changes by knowing how her  breasts normally look and feel and feeling her breasts for changes (breast awareness), or by choosing to use a step-by-step approach and using a specific schedule to examine her breasts.   Women with breast implants can do BSE. It may be useful to have the surgeon help identify the edges of the implant so that you know what you are feeling. There is some thought that the implants push out the breast tissue and may make it easier to examine. Women who are pregnant or breast-feeding can also choose to examine their breasts regularly.    If you choose to do BSE, the following information provides a step-by-step approach for the exam. The best time for a woman to examine her breasts is when the breasts are not tender or swollen. Women who examine their breasts should have their technique reviewed during their periodic health exams by  their health care professional.     It is acceptable for women to choose not to do BSE or to do BSE occasionally. Women who choose not to do BSE should still know how their breasts normally look and feel and report any changes to their doctor right away.    How to examine your breasts  Lie down on your back and place your right arm behind your head. The exam is done while lying down, not standing up. This is because when lying down the breast tissue spreads evenly over the chest wall and is as thin as possible, making it much easier to feel all the breast tissue.    Use the finger pads of the 3 middle fingers on your left hand to feel for lumps in the right breast. Use overlapping dime-sized circular motions of the finger pads to feel the breast tissue.    Use 3 different levels of pressure to feel all the breast tissue. Light pressure is needed to feel the tissue closest to the skin; medium pressure to feel a little deeper; and firm pressure to feel the tissue closest to the chest and ribs. It is normal to feel a firm ridge in the lower curve of each breast, but, you should tell your  doctor if you feel anything else out of the ordinary. If you're not sure how hard to press, talk with your doctor or nurse. Use each pressure level to feel the breast tissue before moving on to the next spot.           Move around the breast in an up and down pattern starting at an imaginary line drawn straight down your side from the underarm and moving across the breast to the middle of the chest bone (sternum or breastbone). Be sure to check the entire breast area going down until you feel only ribs and up to the neck or collar bone (clavicle).        Move around the breast in an up and down pattern starting at an imaginary line drawn straight down your side from the underarm and moving across the breast to the middle of the chest bone (sternum or breastbone). Be sure to check the entire breast area going down until you feel only ribs and up to the neck or collar bone (clavicle).     There is some evidence to suggest that the up-and-down pattern (sometimes called the vertical pattern) is the most effective pattern for covering the entire breast without missing any breast tissue.   Repeat the exam on your left breast, putting your left arm behind your head and using the finger pads of your right hand to do the exam.     While standing in front of a mirror with your hands pressing firmly down on your hips, look at your breasts for any changes of size, shape, contour, or dimpling, or redness or scaliness of the nipple or breast skin. (The pressing down on the hips position contracts the chest wall muscles and enhances any breast changes.)    Examine each underarm while sitting up or standing and with your arm only slightly raised so you can easily feel in this area. Raising your arm straight up tightens the tissue in this area and makes it harder to examine.     This procedure for doing breast self-exam is different from previous recommendations. These changes represent an extensive review of the medical literature  and input from an expert advisory group. There  is evidence that this position (lying down), the area felt, pattern of coverage of the breast, and use of different amounts of pressure increase a woman's ability to find abnormal areas.       Reviewed 10/2011Healthy Practices for Women of all Ages     In addition to your regular OB/GYN history, physical, cholesterol screening, Pap smear, and other recommended cancer screening tests, we ask that you review this list of "healthy practices."  Modifying your daily activities according to these practices may improve your overall health and well-being.  In the event of questions about this list, ask your doctor or health care provider.  Additionally, there are specially written pamphlets available, which offer further information.    Diet and Exercise:    Limit fat and cholesterol; emphasize fruits, grains and vegetables.    Consume dairy products or use calcium supplementation for adequate calcium intake (1200 mg or more).    Add folic acid supplementation 0.4 mg (400 micrograms, present in most daily multivitamins) at least two months before considering pregnancy to reduce the risks of birth defects.    Participate in regular exercise for 30 minutes at least five times a week, and consider weight training.    Injury Prevention:    Seat/lap belts should be worn while in a moving car.    Helmet should be used when using motorcycles, bicycles, roller blades and ATV's or skiing.    Place approved smoke detectors in your house and replace the batteries twice a year.      Guns and other firearms should be stored unloaded and in a locked area.  Trigger locks should be used as well.    Consider CPR training for household members.    Dental Health:    Schedule regular visits to the dentist.    Floss and brush with fluoride toothpaste daily.    Immunizations:    A tetanus/diphtheria booster shot (d/T) is recommended every 10 years.    An MMR vaccine is recommended for non-pregnant  women born after 3 without proof of immunity of documentation of previous immunization.  Adults who are susceptible to varicella (chicken pox) should be vaccinated.    Influenza vaccine is indicated yearly for women age 35 and older, or at any age based on medical history and exposure.  Pregnant women over [redacted] weeks gestation should receive the flu vaccine as well.    Pneumococcal pneumonia vaccine is indicated for age 62 and older once in your life.    Hepatitis A and/or B vaccines are recommended for high-risk individuals.    Substance Abuse:    Stop smoking; do not use any other tobacco products.    Avoid alcohol use when driving, boating, swimming or operating other machinery. Avoid excessive use of alcoholic beverages.    Recreational drug use (marijuana, cocaine, etc.) is dangerous and can be habit-forming.    Sexual Behavior:    Be sure to use contraception if pregnancy is not desired.    Regular use of female or female condoms with spermicide helps prevent STD's.    Consider HIV testing if:  1. You have had more than one sexual partner.  2. You have had any STD's.  3. You have used intravenous drugs.  4. You have a sexual partner with these (the above) risk factors.  5. Your sexual partner has had female homosexual exposure.  6. You received a blood transfusion during 1978-1985.    Breast Health:    Breast self-examinations should be done  monthly (after your period).    A mammogram should be done once between ages 7-40, then every 1-2 years based on risk factors.  If there is a strong family history of premenopausal breast cancer, you should start with mammograms earlier than age 6.    Colon Cancer Surveillance:    Beginning at age 59, stool occult blood screening should be done annually and/or sigmoidoscopy (scope examination of the colon) every 3-5 years.    Women and Alcohol  For women, alcohol use carries some unique and special concerns. Most studies on alcohol have been on men, but we now know women  may respond differently to alcohol.   Concerns: Liver damage, cancer, menstrual cycle changes, birth defects, fetal alcohol syndrome, intoxication, nutrition and weight management.  All women should avoid all alcohol during pregnancy.  Help for Problem Drinking:    One in every three members of Alcoholics Anonymous (AA) is now a woman.  AA meetings are held regularly and have led the way in demonstrating the effectiveness of self-help.    Outpatient and Inpatient Treatment are also available.  Ask your health care provider for a referral.    The first step in successful treatment is to recognize that there is a problem and accept help.    Dietary and Supplemental Calcium  It is important that you build and protect your bone mass through a program of regular exercise and proper nutrition with adequate amounts of calcium in your diet. Dairy products are considered to be the most important sources of calcium.    Reviewed 09/2010

## 2012-11-28 NOTE — Progress Notes (Signed)
Subjective:    Presents for an annual exam.     HPI:   -Patient had Nexplanon placed in October 2013 and has noticed moderate period flow daily since insertion. She denies dizziness or lightheaded feeling. Would like bleeding to stop.    -+ chlamydia in October 2013 and needs TOC. She completed her medication and states partner was informed. She is no longer involved with this individual. She states she is not sexually active often. Has lab orders in for HIV, Syphilis, Hep B&C and plans to have blood drawn today.    -Has noticed more migraine headaches with Nexplanon. States she would get headaches with her menstrual cycle that ibuprofen helped. She feels that she is heaving more headache trouble brought on to having daily bleeding.  She would like an alternative to help. Denies vision changes, SOB or CP.    GYN History   LMP: no real cycle with Nexplanon  Menses:moderate  Cramps: Mild   Menarche: 9  Sexually active. Not currently  STD History: chlamydia   Current contraception:     OB History    Grav Para Term Preterm Abortions TAB SAB Ect Mult Living    1 1 1       1         Patient Active Problem List   Diagnosis Code   . Muscle spasm 728.85   . Quadriparesis At C6 344.04   . Constipation, chronic 564.00   . Depression 311   . Autonomic dysfunction 337.9   . Neurogenic bladder disorder 596.54   . Decubitus ulcer of sacral region 707.03, 707.20   . History of recurrent UTIs V13.02   . 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 Thrombocytopenia 287.5   . Health care maintenance V70.9   . Pressure ulcer stage III 707.00, 707.23   . Neurogenic bladder 596.54   . Nexplanon insertion V25.5   . Chlamydia 079.98      Screening History:   Last pap smear: Date: 2010 Results: negative  History of abnormal pap smear: no.   The patient wears seatbelts:yes   The patient participates in regular exercise: yes   Has the patient ever been transfused or tattooed? yes  Lives with her child  Domestic violence:No   Patient's medications,  allergies, past medical, surgical, social and family histories were reviewed and updated as appropriate.     Review of Systems   CONSTITUTIONAL: Appetite good, no fevers, night sweats or weight loss  EYES: No visual changes, no eye pain  ENT: No hearing difficulties, no ear pain  CV: No chest pain, shortness of breath or peripheral edema  RESPIRATORY: No cough, wheezing or dyspnea  GI: No nausea/vomiting, abdominal pain, or change in bowel habits  GU: No dysuria, urgency or incontinence  MS: No joint pain/swelling or musculoskeletal deformities  SKIN: No rashes  NEURO: No MS changes, no motor weakness, no sensory changes  PSYCH: No depression or anxiety  ENDOCRINE: No polyuria/polydipsia, no heat intolerance  HEME/LYMPH: No easy bleeding/bruising or swollen nodes  ALL/IMMUN: No allergic reactions     Objective:    Blood pressure 111/70.  Neck: no adenopathy, no carotid bruit, no JVD, supple, symmetrical, trachea midline and thyroid not enlarged, symmetric, no tenderness/mass/nodules   Lungs: clear to auscultation bilaterally   Breasts: normal appearance, no masses or tenderness   Heart: regular rate and rhythm, S1, S2 normal, no murmur, click, rub or gallop   Abdomen: soft, non-tender; bowel sounds normal; no masses, no organomegaly  Pelvic:  Ext gent: normal   BSU: normal  Vagina: pink tissue, no lesions. No discharge  Cervix: no CMT, lesion or discharge  Uterus: NSSC  Adnexa: normal, non-tender no mass  Rectal exam: deferred  Skin: Skin color, texture, turgor normal. No rashes or lesions   Neurologic: Grossly normal   Rectal exam: deferred  Microscopic wet mount shows negative for pathogens, normal epithelial cells.       Assessment:     Annual exam  Contraceptive surveillance: Nexplanon   Irregular bleeding: most likely side effect of nexplanon  Migraine headaches  STD counseling and chlamydia TOC   Plan:    GYN SCREENING:   Pap smear   STD screening: GC,Chlamydia.   HIV screening, Syphilis,Hepatitis C,Hepatitis  B surface antibody and antigen ordered    PREVENTATIVE HEALTH:  Screening blood work:  Lipids, CMP, Glucose tolerance test, TFTs- states up to date with PCP    HEALTHY WOMEN'S LIFESTYLE: Written information reviewed and provided. Self breast awareness.   EXERCISE: Counseled on regular exercise.  Discussed duration, frequency, intensity, and types of exercise.  SMOKING CESSATION: N/ A   SAFER SEX PRACTICES:  Reviewed  NUTRITION REVIEWED: Weight Loss Counseling  Or Weight Management Strategies . Calcium and Vitamin D3. Multivitamins     CONTRACEPTION REVIEWED:  Risks and benefits of different contraceptive methods discussed with patient. Reviewed side effects of Nexplanon including menstrual cycle irregularities. Patient will continue to monitor for improvement. She will return in 4 weeks and would consider addition of Aygestin prn.  If bleeding and headache concerns continue patient will consider alternative and removal.  Patient in agreement with plan for continued monitoring for now. Patient states Depo was discontinued due to bone concerns and she is not interested in M-IUD at this time. Would avoid estrogen products given patient medical hx.  CBC requested. Patient case and plan for bleeding/ migraine headaches reviewed with Dr. Donzetta Matters.     Migraine headaches: Rx or Fioricet for prn use. Encouraged f/u with PCP. Will consider options in the event headaches concerns persist.     VACCINATIONS:  Up to date.    HPV vaccine (Gardisil) - declined   Influenza vaccine - declined today       RTC 4 weeks for reassessment of Nexplanon side effects and management plan.( patient aware she may call for f/u as well due to transportation and mobility issues) .

## 2012-11-29 LAB — CHLAMYDIA PLASMID DNA AMPLIFICATION: Chlamydia Plasmid DNA Amplification: 0

## 2012-11-29 LAB — N. GONORRHOEAE DNA AMPLIFICATION: N. gonorrhoeae DNA Amplification: 0

## 2012-11-30 ENCOUNTER — Other Ambulatory Visit: Payer: Self-pay | Admitting: Obstetrics and Gynecology

## 2012-11-30 DIAGNOSIS — R519 Headache, unspecified: Secondary | ICD-10-CM

## 2012-11-30 LAB — GYN CYTOLOGY

## 2012-11-30 MED ORDER — BUTALBITAL-APAP-CAFFEINE 50-325-40 MG PO TABS *I*
1.0000 | ORAL_TABLET | Freq: Four times a day (QID) | ORAL | Status: AC | PRN
Start: 2012-11-30 — End: 2012-12-05

## 2012-12-01 ENCOUNTER — Ambulatory Visit
Admit: 2012-12-01 | Discharge: 2012-12-01 | Disposition: A | Discharge status: Home / Self Care | Payer: Self-pay | Source: Ambulatory Visit | Attending: Obstetrics and Gynecology | Admitting: Obstetrics and Gynecology

## 2012-12-01 DIAGNOSIS — N390 Urinary tract infection, site not specified: Secondary | ICD-10-CM

## 2012-12-01 DIAGNOSIS — N319 Neuromuscular dysfunction of bladder, unspecified: Secondary | ICD-10-CM

## 2012-12-01 DIAGNOSIS — Z113 Encounter for screening for infections with a predominantly sexual mode of transmission: Secondary | ICD-10-CM

## 2012-12-01 LAB — BASIC METABOLIC PANEL
Anion Gap: 12 (ref 7–16)
CO2: 21 mmol/L (ref 20–28)
Calcium: 9 mg/dL (ref 8.8–10.2)
Chloride: 110 mmol/L — ABNORMAL HIGH (ref 96–108)
Creatinine: 0.46 mg/dL — ABNORMAL LOW (ref 0.51–0.95)
GFR,Black: 157 *
GFR,Caucasian: 136 *
Glucose: 91 mg/dL (ref 60–99)
Lab: 9 mg/dL (ref 6–20)
Potassium: 3.3 mmol/L (ref 3.3–5.1)
Sodium: 143 mmol/L (ref 133–145)

## 2012-12-01 LAB — HEPATITIS C ANTIBODY: Hep C Ab: NEGATIVE

## 2012-12-01 LAB — HIV 1&2 ANTIGEN/ANTIBODY: HIV 1&2 ANTIGEN/ANTIBODY: NONREACTIVE

## 2012-12-01 LAB — HEPATITIS B SURFACE ANTIGEN: HBV S Ag: NEGATIVE

## 2012-12-01 LAB — CBC
Hematocrit: 39 % (ref 34–45)
Hemoglobin: 12.2 g/dL (ref 11.2–15.7)
MCV: 91 fL (ref 79–95)
Platelets: 73 10*3/uL — ABNORMAL LOW (ref 160–370)
RBC: 4.3 MIL/uL (ref 3.9–5.2)
RDW: 16.5 % — ABNORMAL HIGH (ref 11.7–14.4)
WBC: 6.6 10*3/uL (ref 4.0–10.0)

## 2012-12-01 LAB — MULTIPLE ORDERING DOCS

## 2012-12-02 LAB — SYPHILIS SCREEN
Syphilis Screen: NEGATIVE
Syphilis Status: NONREACTIVE

## 2012-12-04 LAB — AEROBIC CULTURE

## 2012-12-05 ENCOUNTER — Other Ambulatory Visit: Payer: Self-pay | Admitting: Internal Medicine

## 2012-12-05 MED ORDER — BISACODYL 10 MG RE SUPP *I*
10.0000 mg | Freq: Every day | RECTAL | Status: DC | PRN
Start: 2012-12-05 — End: 2013-06-07

## 2012-12-05 NOTE — Telephone Encounter (Signed)
Patient is calling for a refill on DULCOLAX 10 mg. Patient can be reached at [phone].

## 2012-12-06 ENCOUNTER — Telehealth: Payer: Self-pay | Admitting: Internal Medicine

## 2012-12-06 ENCOUNTER — Telehealth: Payer: Self-pay

## 2012-12-06 ENCOUNTER — Other Ambulatory Visit: Payer: Self-pay | Admitting: Urology

## 2012-12-06 MED ORDER — SULFAMETHOXAZOLE-TRIMETHOPRIM 800-160 MG PO TABS *I*
1.0000 | ORAL_TABLET | Freq: Two times a day (BID) | ORAL | Status: DC
Start: 2012-12-06 — End: 2013-04-10

## 2012-12-06 NOTE — Telephone Encounter (Signed)
It was recevied with incorect providers listed - Tresa Endo notified and resent with corrected information.  In Dr. [place] Service folder for him to complete tomorrow.

## 2012-12-06 NOTE — Telephone Encounter (Signed)
Patient given Bactrim for UTI

## 2012-12-06 NOTE — Telephone Encounter (Signed)
Patient is calling for urine results please give patient a call.

## 2012-12-06 NOTE — Telephone Encounter (Signed)
Cindy Ochoa, Software engineer from [employer] Calling b/c she stated she has been faxing over an order for over a month now and it has not came back signed. She stated the patient is due for another round of supplies for December. I provided Cindy Ochoa with the Fax of [phone] & she stated she was given a different fax #. She just needs the order faxed back to her signed. Cindy Ochoa can be reached at [phone].

## 2012-12-08 ENCOUNTER — Ambulatory Visit: Payer: Self-pay | Admitting: Obstetrics and Gynecology

## 2012-12-08 NOTE — Telephone Encounter (Signed)
Signed corrected paperwork and faxed - message left for Tresa Endo that this was done.

## 2012-12-12 ENCOUNTER — Encounter: Payer: Self-pay | Admitting: Surgery

## 2012-12-15 ENCOUNTER — Encounter: Payer: Self-pay | Admitting: Internal Medicine

## 2012-12-15 ENCOUNTER — Ambulatory Visit: Payer: Self-pay | Admitting: Internal Medicine

## 2012-12-15 VITALS — BP 112/68 | HR 92 | Ht 64.0 in

## 2012-12-15 DIAGNOSIS — N319 Neuromuscular dysfunction of bladder, unspecified: Secondary | ICD-10-CM

## 2012-12-15 DIAGNOSIS — Z430 Encounter for attention to tracheostomy: Secondary | ICD-10-CM

## 2012-12-15 DIAGNOSIS — G8254 Quadriplegia, C5-C7 incomplete: Secondary | ICD-10-CM

## 2012-12-15 NOTE — Progress Notes (Signed)
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 A diagnosis of Neurogenic bladder disorder was also pertinent to this visit.      HPI:  Cindy Ochoa is 26 y.o. year old female    Needs face-to-face for aid service     Quad at [employer], total care, here with her mother today  Autonomic dysfunction.  Constantly sweating  Severe muscle spasms and lower legs    She recently started smoking  Patient's previous trach has noted some bubbling around the stoma with washing.  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 sulfamethoxazole-trimethoprim (BACTRIM DS,SEPTRA DS) 800-160 MG per tablet Take 1 tablet by mouth 2 times daily   . Skin Protectants, Misc. OINT Apply 1 Tube topically 2 times daily     . bisacodyl (DULCOLAX) 10 MG suppository Place 1 suppository (10 mg total) rectally daily as needed for Constipation   . butalbital-acetaminophen-caffeine (FIORICET, ESGIC) 50-325-40 MG per tablet Take 1 tablet by mouth every 6 hours as needed for Headaches   . ibuprofen (ADVIL,MOTRIN) 800 MG tablet Take 1 tablet (800 mg total) by mouth 3 times daily as needed for Pain   . docusate sodium (COLACE) 100 MG capsule Take 1 capsule (100 mg total) by mouth 2 times daily   . incontinence supply disposable Large pull ups - use up to 5 x  daily  Diagnosis:  788.34 Incontinence without sensory awareness   . Non-System Medication One Hoyer Lift  Diagnosis:  Quadriplegic ICD-9 344.04   . disposable gloves 1 box Disposable Medium size gloves     No current facility-administered medications for this visit.       Medication list reconciled this visit    Allergies:     Allergies   Allergen Reactions   . Heparin      Thrombocytopenia;    . Nitrofurantoin      Vomiting   . Vancomycin Hives     hives 2006 but tolerated Rx in 2010  pt states she had vancomycin in 04/2012.  Feels this is not true allergy  as she has received it recently - had no reaction        Social history      History   Substance Use Topics   . Smoking status: Former Smoker -- 0.50 packs/day for 3 years     Types: Cigarettes     Start date: 12/09/2011     Quit date: 05/25/2012   . Smokeless tobacco: Former Neurosurgeon     Quit date: 04/29/2005      Comment: smokes black and milds   .  Alcohol Use: No       Review of Systems     CONSTITUTIONAL: Appetite good, weight is stable, has autonomic dysfunction so sweats constantly  CV: No chest pain, shortness of breath or peripheral edema  RESPIRATORY: No cough, wheezing or dyspnea  GI: No nausea/vomiting, abdominal pain, or change in bowel habits  GU: No dysuria, urgency, freqency,incontinence or hemauria  NEURO: No MS changes, no motor weakness, no sensory changes or no upper extremity weakness    Physical Exam:     Patient is alert and oriented and in no distress.   Cardiovascular: Normal rate, regular rhythm, S1 normal and S2 normal, no edema.     Neck old scar no drainage   Pulmonary/Chest: Lungs are clear   Abdominal: soft, non-tender, bowel sounds are normal,  Urostomy draining clear yellow urine  Skin: Skin is warm and dry. No rash noted.   Trach no drainage  O2 sat 100%  Muscle spasms noted during exam  Right foot is inverted    Filed Vitals:    12/15/12 1055   BP: 112/68   Pulse: 92   Height: 1.626 m (5\' 4" )     Wt Readings from Last 3 Encounters:    10/31/12 86.183 kg (190 lb)   10/31/12 86.183 kg (190 lb)   10/17/12 73.483 kg (162 lb)     BP Readings from Last 3 Encounters:   12/15/12 112/68   11/28/12 111/70   10/31/12 100/57           RESULTS:         Component Value Date/Time    NA 143 12/01/2012 1059    K 3.3 12/01/2012 1059    CREAT 0.46* 12/01/2012 1059    GLU 91 12/01/2012 1059    CA 9.0 12/01/2012 1059     Lab Results   Component Value Date    ALT 15 08/22/2012    AST 32 08/22/2012    GGT 35 11/19/2009        Lab Results   Component Value Date    TSH 0.67 09/01/2011     Lab Results   Component Value Date    WBC 6.6 12/01/2012    HGB 12.2 12/01/2012    HCT 39 12/01/2012    MCV 91 12/01/2012    PLT 73* 12/01/2012     Lab Results   Component Value Date    CREAT 0.46* 12/01/2012       ASSESSMENT/PLAN:     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 If needs brace         She is none weight bearing     Neurogenic bladder disorder  Now with urostomy has really helped decubiti healing      Smoking:  Discussed smoking cessation with patient. Patient readiness to quit:  Yes  Discussed/counseled with patient smoking cessation plan according to USPSTF guidelines:  I advised patient to quit, and offered support., Educational material distributed., Discussed current use pattern., Asked patient to inform me when they set a quit date. and Commended for decision.  Smoking cessation counseling indicator:  Intensive (Over 10 Minutes)    Tracheostomy care        Referral to ENT if needs any thru care to stoma     Patient instructions      Follow up with PCP in 3-6 months  Karen Kitchens,  Nurse Practitioner  [facility]

## 2012-12-22 ENCOUNTER — Encounter: Payer: Self-pay | Admitting: Gastroenterology

## 2012-12-29 ENCOUNTER — Ambulatory Visit: Payer: Self-pay | Admitting: Obstetrics and Gynecology

## 2013-01-29 ENCOUNTER — Encounter: Payer: Self-pay | Admitting: Gastroenterology

## 2013-01-30 ENCOUNTER — Telehealth: Payer: Self-pay | Admitting: Internal Medicine

## 2013-01-30 MED ORDER — INCONTINENCE SUPPLY DISPOSABLE MISC *A*
Status: DC
Start: 2013-01-30 — End: 2013-08-22

## 2013-01-30 NOTE — Telephone Encounter (Signed)
Cindy Ochoa is calling to request a prescription for a case of pull-ups (adult diapers), size large, to be sent to [facility].  Please call her back at [phone].

## 2013-01-30 NOTE — Telephone Encounter (Signed)
In the [employer]

## 2013-02-11 NOTE — Progress Notes (Signed)
[facility]  Progress Note      Name: Cindy Ochoa, Cindy Ochoa  MRN: [id number]  DOB: April 23, 1986      Date of Encounter: 08/10/2012      Medical Providers    Referring: Andrey Campanile, MD   PCP: Andrey Campanile, MD       Chief Complaint      Chief Complaint   Patient presents with   . Wound Check     PU R-ischium, questionable sacral wound          History of Present Ilness   Cindy Ochoa is a 27 y.o. female. She returns for a follow of visit for management of Right ischial pressure ulcer.    She has a prior history of similar pressure ulcers that have been healed in the past with better moisture control and off-loading.  Patient and aid state that this ulcer just reformed recently.     Patient is scheduled to see her Urologist next week for possible supra-pubic tube vs other definitive procedure for better urinary diversion.    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      Allergies   She is allergic to heparin; nitrofurantoin; and vancomycin.      Medications  Current Outpatient Prescriptions   Medication Sig    . incontinence supply disposable Large pull ups - use up to 5 x daily  Diagnosis:  788.34 Incontinence without sensory awareness   . sulfamethoxazole-trimethoprim (BACTRIM DS,SEPTRA DS) 800-160 MG per tablet Take 1 tablet by mouth 2 times daily   . bisacodyl (DULCOLAX) 10 MG suppository Place 1 suppository (10 mg total) rectally daily as needed for Constipation   . butalbital-acetaminophen-caffeine (FIORICET, ESGIC) 50-325-40 MG per tablet Take 1 tablet by mouth every 6 hours as needed for Headaches   . ibuprofen (ADVIL,MOTRIN) 800 MG tablet Take 1 tablet (800 mg total) by mouth 3 times daily as needed for Pain   . docusate sodium (COLACE) 100 MG capsule Take 1 capsule (100 mg total) by mouth 2 times daily   . Skin Protectants, Misc. OINT Apply 1 Tube topically 2 times daily     . Non-System Medication One Hoyer Lift  Diagnosis:  Quadriplegic ICD-9 344.04   . disposable gloves 1 box Disposable Medium size gloves     No current facility-administered medications for this visit.         Review of Systems   Unchanged from prior visit.    Past Surgical History   She has past surgical history that includes Multiple injuries (04/29/2005 ); craniotomy (04/29/2005); Cervical spine surgery (04/29/2005); Cervical spine surgery (05/04/2005); Tracheostomy tube placement (05/15/05); Gastrostomy tube placement (05/15/05); IVC filter (May 2006 ); Urethral plication (12/19/2009); Left Tibia fracture (06/01/07); PICC insertion > 5 years (  08/27/2012); and ileal loop urinary diversion (08/26/2012 ).      Social History   She reports that she quit smoking about 8 months ago. Her smoking use included Cigarettes. She started smoking about 14 months ago. She has a 1.5 pack-year smoking history. She quit smokeless tobacco use about 7 years ago. She reports that she uses illicit drugs (Marijuana). She reports that she does not drink alcohol.      Family History   Her family history includes Breast cancer in an other family member; Cancer in an  other family member; Diabetes in her maternal grandmother and mother; High cholesterol in her mother; Hypertension in an other family member; Osteoarthritis in her maternal grandmother; and Stroke in her maternal grandfather.  There is no history of Colon cancer and Thrombosis.      Vital Signs  Filed Vitals:    08/10/12 1007   BP: 101/65   Pulse: 85   Temp: 35.6 C (96.1 F)   Resp: 18         Physical Exam   General appearance: alert, well appearing, and in no distress, oriented to person, place, and time, acyanotic, in no respiratory distress and well hydrated.   Heart: normal rate and regular rhythm.    Lungs: unlabored respirations, no intercostal retractions or accessory muscle use.   Extremities: extremities normal, atraumatic, no cyanosis or edema. pulses not examined.   Neurologic: sensation not intact in feet       Wound #6   Location: Right ischium   Size:1.0 cm (length), 0.8 cm (width), 0.3 cm (depth) 0.4 cm  post-debridement - Stage 3   Tunnneling: no   Undermining: no   Drainage: small.   Odor: no   Obvious infection: no   Edema: none   Tissue composition: healthy   Surrounding skin: healthy   Wound debrided today: yes    Procedure Note:    Patient was sterile prepped and draped as appropriate.    Topical/Local anesthetic:yes    Debridement level:Wound # 6 was debrided of devitalized tissue to muscle with a a surgical curette. Total area debrided: approx. 1 cm sq cm sq.  Good hemostasis obtained. Patient tolerated procedure well.    Pertinent Studies  Prior records reviewed.      Assessment   Cindy Ochoa is a 27 y.o. female with recurrent Stage 3 pressure ulcer.      Plan   Wound debrided today: yes. See procedure note.     Local wound care:   Continue skin barrier cream and better offloading to protect from further breakdown and prevent progression.    Patient being followed by Urology for possible suprapubic tube vs. surgery.     Follow up in 2-3 weeks.

## 2013-02-28 ENCOUNTER — Encounter: Payer: Self-pay | Admitting: Gastroenterology

## 2013-03-14 ENCOUNTER — Telehealth: Payer: Self-pay | Admitting: Internal Medicine

## 2013-03-14 ENCOUNTER — Ambulatory Visit: Payer: Self-pay | Admitting: Physical Medicine and Rehabilitation

## 2013-03-14 NOTE — Telephone Encounter (Signed)
Patient scheduled 6/4 at 2pm with Dr.Petkovich.

## 2013-03-14 NOTE — Telephone Encounter (Signed)
Patient calling to schedule a regular checkup.

## 2013-03-15 ENCOUNTER — Other Ambulatory Visit: Payer: Self-pay | Admitting: Internal Medicine

## 2013-03-15 NOTE — Telephone Encounter (Signed)
Medication request routed to Cleophus Molt, MD for processing 03/15/2013 10:42 AM.    Requested Prescriptions     Pending Prescriptions Disp Refills   . DOK 100 MG capsule [Pharmacy Med Name: DOCUSATE SODIUM 100MG  CAPSULES] 60 capsule      Sig: TAKE 1 CAPSULE BY MOUTH TWICE DAILY

## 2013-03-15 NOTE — Telephone Encounter (Signed)
Cindy Molt, MD is currently unavailable, Rx request routed to [facility] for 03/15/2013 for processing.     Requested Prescriptions     Pending Prescriptions Disp Refills   . DOK 100 MG capsule [Pharmacy Med Name: DOCUSATE SODIUM 100MG  CAPSULES] 60 capsule      Sig: TAKE 1 CAPSULE BY MOUTH TWICE DAILY

## 2013-03-31 ENCOUNTER — Ambulatory Visit: Payer: Self-pay | Admitting: Physical Medicine and Rehabilitation

## 2013-04-06 ENCOUNTER — Telehealth: Payer: Self-pay | Admitting: Internal Medicine
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
## 2013-04-06 NOTE — Telephone Encounter (Signed)
Left message Orthopedics specialist was Dr. Sharlene Motts at [facility] [phone]

## 2013-04-10 ENCOUNTER — Ambulatory Visit: Payer: Self-pay | Admitting: Physical Medicine and Rehabilitation

## 2013-04-10 VITALS — BP 114/53 | HR 88 | Temp 95.7°F

## 2013-04-10 DIAGNOSIS — N319 Neuromuscular dysfunction of bladder, unspecified: Secondary | ICD-10-CM

## 2013-04-10 MED ORDER — SULFAMETHOXAZOLE-TRIMETHOPRIM 800-160 MG PO TABS *I*
1.0000 | ORAL_TABLET | Freq: Two times a day (BID) | ORAL | Status: DC
Start: 2013-04-10 — End: 2013-06-20

## 2013-04-10 NOTE — Progress Notes (Signed)
Subjective: "I have a UTI"     Patient ID: Cindy Ochoa is a 27 y.o. female Seen in the urology clinic today    HPI Patient has neurogenic bladder dysfunction secondary to spinal cord injury.  On August 26, 2012, she underwent an ileal loop diversion with Dr. Lisabeth Pick.  She is very pleased with the results and reports that her sacral pressure sores have healed.  She reports that for the last 4 days, she has experienced chills, no fever and foul-smelling urine.    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  Current Outpatient Prescriptions on File Prior to Visit   Medication Sig Dispense Refill   . DOK 100 MG capsule TAKE 1 CAPSULE BY MOUTH TWICE DAILY  60 capsule  0   . 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 bisacodyl (DULCOLAX) 10 MG suppository Place 1 suppository (10 mg total) rectally daily as needed for Constipation  12 suppository  5   . ibuprofen (ADVIL,MOTRIN) 800 MG tablet Take 1 tablet (800 mg total) by mouth 3 times daily as needed for Pain  50 tablet  2   . Skin Protectants, Misc. OINT Apply 1 Tube topically 2 times daily    1 Tube  11   . Non-System Medication One Nurse, adult  Diagnosis:  Quadriplegic ICD-9 344.04  1 each  0   . disposable gloves 1 box Disposable Medium size gloves  100 each  6   . butalbital-acetaminophen-caffeine (FIORICET, ESGIC) 50-325-40 MG per tablet Take 1 tablet by mouth every 6 hours as needed for Headaches  50 tablet  2     No current facility-administered medications on file prior to visit.     Cindy Ochoa is allergic to heparin; nitrofurantoin; and  vancomycin.    Review of Systems   Constitutional: Positive for chills and diaphoresis. Negative for fever.   Genitourinary:        See HPI   All other systems reviewed and are negative.              Objective:  BP 114/53  Pulse 88  Temp(Src) 35.4 C (95.7 F)     Physical Exam   Constitutional:   Limited physical exam reveals a young overweight, African American female in no distress in a wheelchair   Abdominal: Soft. She exhibits no distension. There is no tenderness.   Urostomy in right abdominal wall is a pink healthy stoma.  It is draining cloudy urine with much mucus.     Procedure:The stoma was catheterized with a 10 French catheter without any difficulty    Results for Cindy, Ochoa (MRN [id number]) as of 04/10/2013 16:39   Ref. Range 12/01/2012 10:59   Creatinine Latest Range: 0.51-0.95 mg/dL 4.54 (L)       Assessment:      Neurogenic bladder dysfunction with ileal loop diversion.  Likely urinary tract infection         Plan:      Will send the urine for culture and sensitivity and follow the culture report.  We'll start Septra empirically.  She is advised to followup with the wound ostomy nurse.  She'll followup in 6  months time with Chrisandra Carota , NP, as I will be leaving the department.  We will check a normal ultrasound at the time to evaluate for any renal stones or hydronephrosis.  We'll check a basic metabolic profile prior to next visit as well.    This note was dictated using Animal nutritionist. The document was reviewed but errors may still be present.

## 2013-04-11 ENCOUNTER — Ambulatory Visit: Payer: Self-pay | Admitting: Ophthalmology

## 2013-04-13 LAB — AEROBIC CULTURE

## 2013-04-14 ENCOUNTER — Other Ambulatory Visit: Payer: Self-pay | Admitting: Physical Medicine and Rehabilitation

## 2013-04-24 ENCOUNTER — Telehealth: Payer: Self-pay | Admitting: Internal Medicine
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
## 2013-04-24 NOTE — Telephone Encounter (Signed)
Reginarequesting a letter from Marlan Palau stating her medical condition.     Date of patient's last appointment:  If more than 4 weeks ago, please schedule an appointment for the patient. Paperwork cannot be completed without an appointment.     Letter/Form Due Date: Before 05/15/13 her court date  Medical Release on file? Yes  Where does the letter need to be sent? To patient @ [address] [phone]  Does the patient want a copy? Yes If yes, pick up on AC5 or mailed? mailed    Please call the patient when this is complete. Inform the patient that letter and form requests can take 10-14 days to complete due to the physician's schedule.

## 2013-04-25 ENCOUNTER — Telehealth: Payer: Self-pay | Admitting: Urology

## 2013-04-25 NOTE — Telephone Encounter (Signed)
PT WOULD LIKE A PRESCRIPTION  ANOTHER EXTENSION FOR HER AT HOME BED BAG. THANK YOU

## 2013-04-25 NOTE — Telephone Encounter (Signed)
Returned call, no answer, left message.

## 2013-04-25 NOTE — Telephone Encounter (Signed)
Cindy Ochoa (in [place]) is calling to add on to previous call from patient stating that her present housing, [address] isn't handicap accessible (doesn't have a ramp)  and she can't get out the house. Stated she has court on 05/15/13 and need the letter before then.  Asking for letter to be mailed to address in erecords.

## 2013-04-26 ENCOUNTER — Telehealth: Payer: Self-pay

## 2013-04-26 NOTE — Telephone Encounter (Signed)
Discussed with Marlan Palau who called the [facility].  They have evaluated Cindy Ochoa's house and found a temporary ramp that is unsafe for patient to use with her power wheelchair.  They will look into replacing it.  For now, will write letter explaining that patient cannot exit her home safely right now.  Will send it to the patient - or the patient can pick it up.  Andrey Campanile, MD

## 2013-04-26 NOTE — Telephone Encounter (Signed)
04/26/2013  team meeting. Care plan discussed.

## 2013-04-28 ENCOUNTER — Other Ambulatory Visit: Payer: Self-pay | Admitting: Urology

## 2013-04-28 DIAGNOSIS — N319 Neuromuscular dysfunction of bladder, unspecified: Secondary | ICD-10-CM

## 2013-04-28 NOTE — Telephone Encounter (Signed)
Letter completed as noted by Dr. Daphine Deutscher  Voice mail msg left for patient indicating copy was being mailed to                              [address]                            [place]  [address]      No further action required by our office at this time

## 2013-05-02 ENCOUNTER — Other Ambulatory Visit: Payer: Self-pay | Admitting: Internal Medicine

## 2013-05-02 NOTE — Telephone Encounter (Signed)
MD unavailable, Rx request routed to [facility] for 05/02/2013

## 2013-05-31 ENCOUNTER — Ambulatory Visit: Payer: Self-pay | Admitting: Internal Medicine

## 2013-06-07 ENCOUNTER — Other Ambulatory Visit: Payer: Self-pay | Admitting: Internal Medicine

## 2013-06-07 ENCOUNTER — Telehealth: Payer: Self-pay | Admitting: Internal Medicine

## 2013-06-07 NOTE — Telephone Encounter (Signed)
The patient can be reached at [phone], she is trying to schedule a routine check up. Please call to schedule.

## 2013-06-12 ENCOUNTER — Encounter: Payer: Self-pay | Admitting: Gastroenterology

## 2013-06-12 NOTE — Telephone Encounter (Signed)
Patient scheduled 6/24 at 10:50am with Karen Kitchens, NP.

## 2013-06-20 ENCOUNTER — Ambulatory Visit: Payer: Self-pay | Admitting: Internal Medicine

## 2013-06-20 ENCOUNTER — Telehealth: Payer: Self-pay | Admitting: Internal Medicine

## 2013-06-20 ENCOUNTER — Encounter: Payer: Self-pay | Admitting: Internal Medicine

## 2013-06-20 VITALS — BP 108/64 | HR 76 | Temp 96.4°F

## 2013-06-20 MED ORDER — DOCUSATE SODIUM 100 MG PO CAPS *I*
100.0000 mg | ORAL_CAPSULE | Freq: Two times a day (BID) | ORAL | Status: DC
Start: 2013-06-20 — End: 2015-10-09

## 2013-06-20 MED ORDER — NICOTINE 7 MG/24HR TD PT24 *I*
1.0000 | MEDICATED_PATCH | Freq: Every day | TRANSDERMAL | Status: DC
Start: 2013-06-20 — End: 2014-03-22

## 2013-06-20 NOTE — Progress Notes (Signed)
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 Diagnoses of Depression, Constipation, chronic, and Smoking were also pertinent to this visit.      HPI:  Cindy Ochoa is 27 y.o. year old female    Name   . 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 She came to day via ambulit and stretcher. She can't leave the house in her power wheel chair until the ramp is built. She thinks it will be in the next week or two.    She colostomy and ileal loop   She has no decubi for over a year.    She see urology routinely and has an upcoming appointment    Mother lives close by and is supportive     . Depression  She feeling depressed  This is unchanged  Declined counseling or medication  Might be nice to be part of support group.     . Constipation, chronic  Out of colace and has noted more constipation  Colostomy is working  No blood, or mucous      . Smoking  1-3 blacks daily  Wants to quit  Has used path in the past with good results     She has a 68 year old son  He will finish school next week and will be living with father in [place] for the summer    Medications:     Current Outpatient Prescriptions   Medication Sig   . docusate sodium (DOK) 100 MG capsule Take 1 capsule (100 mg total) by mouth 2 times daily   . nicotine (NICODERM CQ) 7 MG/24HR patch Place 1 patch onto the skin daily   Remove & discard patch after 24 hours.   Marland Kitchen BISAC-EVAC 10 MG suppository PLACE 1 SUPPOSITORY RECTALLY DAILY AS NEEDED FOR CONSTIPATION   . ibuprofen (ADVIL,MOTRIN) 800 MG tablet Take 1 tablet (800 mg total) by mouth 3 times daily as needed for Pain   FOR PAIN   . incontinence supply disposable Large pull ups - use up to 5 x daily  Diagnosis:  788.34 Incontinence without sensory awareness   . butalbital-acetaminophen-caffeine (FIORICET, ESGIC) 50-325-40 MG per tablet Take 1 tablet by mouth every 6 hours as needed for Headaches   . Skin Protectants,  Misc. OINT Apply 1 Tube topically 2 times daily     . Non-System Medication One Hoyer Lift  Diagnosis:  Quadriplegic ICD-9 344.04   . disposable gloves 1 box Disposable Medium size gloves     No current facility-administered medications for this visit.       Medication list reconciled this visit    Allergies:     Allergies   Allergen Reactions   . Heparin      Thrombocytopenia;    . Nitrofurantoin      Vomiting   . Vancomycin Hives     hives 2006 but tolerated Rx in 2010  pt states she had vancomycin in 04/2012.  Feels this is not true allergy  as she has received it recently - had no reaction        Social history      History   Substance Use Topics   . Smoking status: Current Every Day Smoker -- 0.50 packs/day for 3 years     Types: Cigarettes     Start date: 12/09/2011     Last Attempt to Quit: 05/25/2012   .  Smokeless tobacco: Former Neurosurgeon     Quit date: 04/29/2005      Comment: smokes black and milds   . Alcohol Use: No       Review of Systems     CONSTITUTIONAL: Appetite good, no fevers, night sweats or weight loss  CV: No chest pain, shortness of breath or peripheral edema  RESPIRATORY: No cough, wheezing or dyspnea  GI: colostomy   GU: Ileal loop, yellow urine  NEURO: No MS changes, no motor weakness, no sensory changes or no upper extremity weakness    Physical Exam:     Patient is alert and oriented and in no distress.   Cardiovascular: Normal rate, regular rhythm, S1 normal and S2 normal, no edema.     Pulmonary/Chest: Lungs are clear   Abdominal: soft, non-tender, bowel sounds are normal,   Colostomy and ileal loop appear to be functioning Skin:       Filed Vitals:    06/20/13 1024   BP: 108/64   Pulse: 76   Temp: 35.8 C (96.4 F)   TempSrc: Temporal     Wt Readings from Last 3 Encounters:   10/31/12 86.183 kg (190 lb)   10/31/12 86.183 kg (190 lb)   10/17/12 73.483 kg (162 lb)     BP Readings from Last 3 Encounters:   06/20/13 108/64   04/10/13 114/53   12/15/12 112/68           RESULTS:          Component Value Date/Time    NA 143 12/01/2012 1059    K 3.3 12/01/2012 1059    CREAT 0.46* 12/01/2012 1059    GLU 91 12/01/2012 1059    CA 9.0 12/01/2012 1059     Lab Results   Component Value Date    ALT 15 08/22/2012    AST 32 08/22/2012    GGT 35 11/19/2009        Lab Results   Component Value Date    TSH 0.67 09/01/2011     Lab Results   Component Value Date    WBC 6.6 12/01/2012    HGB 12.2 12/01/2012    HCT 39 12/01/2012    MCV 91 12/01/2012    PLT 73* 12/01/2012     Lab Results   Component Value Date    CREAT 0.46* 12/01/2012         ASSESSMENT/PLAN:     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 Patient readiness to quit:  Yes  Discussed/counseled with patient smoking cessation plan according to USPSTF guidelines:  I advised patient to quit, and offered support., Educational material distributed., Discussed current use pattern., Asked patient to inform me when they set a quit date., Brochure to patient. Quit date set (see comments). Reviewed strategies to maximize success., Commended for decision., Commended for quitting. Reviewed interventions to prevent relapses., Reason for relapse was discussed (see comments). Reviewed strategies for quitting again, and dealing with triggers. and Nicotine patches prescribed.  Smoking cessation counseling indicator:  Intensive (Over 10 Minutes)      Other Orders  - docusate sodium (DOK) 100 MG capsule; Take 1 capsule (  100 mg total) by mouth 2 times daily  - nicotine (NICODERM CQ) 7 MG/24HR patch; Place 1 patch onto the skin daily   Remove & discard patch after 24 hours.        Follow up in 3 months with PCP           Karen Kitchens,  Nurse Practitioner  [facility] Internal  Medicine

## 2013-06-20 NOTE — Telephone Encounter (Signed)
Cindy Ochoa with [employer]	[place] inquiring on status of order for Power pressure reducing air mattress.

## 2013-06-20 NOTE — Telephone Encounter (Signed)
Bridgette to refax form directly to me and I will have Dr. Daphine Deutscher sign tomorrow.

## 2013-06-21 ENCOUNTER — Encounter: Payer: Self-pay | Admitting: Gastroenterology

## 2013-06-27 ENCOUNTER — Ambulatory Visit: Payer: Self-pay | Admitting: Obstetrics & Gynecology

## 2013-07-11 ENCOUNTER — Ambulatory Visit: Payer: Self-pay | Admitting: Oral and Maxillofacial Surgery

## 2013-07-11 ENCOUNTER — Encounter: Payer: Self-pay | Admitting: Gastroenterology

## 2013-07-17 ENCOUNTER — Ambulatory Visit: Payer: Self-pay | Admitting: Obstetrics & Gynecology

## 2013-07-17 ENCOUNTER — Encounter: Payer: Self-pay | Admitting: Gastroenterology

## 2013-07-18 ENCOUNTER — Telehealth: Payer: Self-pay | Admitting: Internal Medicine

## 2013-07-18 NOTE — Telephone Encounter (Signed)
I returned call to this caller.  I informed the caller that Dr. Daphine Deutscher not in the office today.  The caller said he would send over a release so that  Office can speak with him.

## 2013-07-18 NOTE — Telephone Encounter (Signed)
Cindy Ochoa who represent the patient as her attorney called requesting to speak with Dr Daphine Deutscher. The patient has a trial in October and they are needing to know what physicians would be able to testify in her case. He would like a call back before 8.1.14 as that is there deadline. Please call him back at [phone]

## 2013-07-19 ENCOUNTER — Other Ambulatory Visit: Payer: Self-pay | Admitting: Urology

## 2013-07-19 ENCOUNTER — Telehealth: Payer: Self-pay | Admitting: Urology

## 2013-07-19 MED ORDER — CEPHALEXIN 500 MG PO CAPS *I*
500.0000 mg | ORAL_CAPSULE | Freq: Three times a day (TID) | ORAL | Status: DC
Start: 2013-07-19 — End: 2013-08-31

## 2013-07-19 NOTE — Telephone Encounter (Signed)
The patient was called and she has symptoms of UTI with a urostomy.  I've given her a week of cephalexin with the understanding that if her symptoms do not improve she must come in and have her stomach catheterized and be seen in the office.  She understands.

## 2013-07-19 NOTE — Telephone Encounter (Signed)
Pt has symptoms  of UTI, can you pls give pt a call Thank You OR ADVISE

## 2013-07-24 ENCOUNTER — Telehealth: Payer: Self-pay | Admitting: Internal Medicine

## 2013-07-24 NOTE — Telephone Encounter (Signed)
Cindy Ochoa is a quadriplegic and noticed yesterday that her right foot is light green in color.  She has not injured her foot.  She would like to be seen in urgent care if possible.  Please call her at [phone].

## 2013-07-24 NOTE — Telephone Encounter (Signed)
The pt is calling with concerns that her mother noted her right foot to be a green color.  The pt has no feeling in her feet and was unable to assess for pain.  Her aide described her feet as warm and same temp.  Appt made with Karen Kitchens NP on 07/25/13 after discussing the pt's symptoms.  Transportation set up for 07/25/13- wheelchair mobile with [employer] to pick up anytime after 10:30 am.

## 2013-07-25 ENCOUNTER — Encounter: Payer: Self-pay | Admitting: Internal Medicine

## 2013-07-25 ENCOUNTER — Ambulatory Visit: Payer: Self-pay | Admitting: Urology

## 2013-07-25 ENCOUNTER — Ambulatory Visit: Payer: Self-pay | Admitting: Internal Medicine

## 2013-07-25 VITALS — BP 110/66 | HR 84 | Temp 96.8°F | Ht 64.0 in | Wt 190.0 lb

## 2013-07-25 DIAGNOSIS — G43829 Menstrual migraine, not intractable, without status migrainosus: Secondary | ICD-10-CM | POA: Insufficient documentation

## 2013-07-25 DIAGNOSIS — Z0000 Encounter for general adult medical examination without abnormal findings: Secondary | ICD-10-CM
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
## 2013-08-08 ENCOUNTER — Ambulatory Visit: Payer: Self-pay | Admitting: Internal Medicine

## 2013-08-18 ENCOUNTER — Encounter: Payer: Self-pay | Admitting: Gastroenterology

## 2013-08-21 ENCOUNTER — Telehealth: Payer: Self-pay | Admitting: Urology

## 2013-08-21 NOTE — Telephone Encounter (Signed)
Patient's Mom [name] health care proxy calling to ask why she Domingo is so bloated.  Daughter had a urostomy & it does not look like she was ever seen for a pop.   Her last appointment with you on 07/25/13 was ancelled.  Patient asking for an Ultrasound.  Her # is [phone].  No pain or fever.

## 2013-08-22 ENCOUNTER — Other Ambulatory Visit: Payer: Self-pay | Admitting: Internal Medicine

## 2013-08-22 DIAGNOSIS — G8250 Quadriplegia, unspecified: Secondary | ICD-10-CM

## 2013-08-22 MED ORDER — INCONTINENCE SUPPLY DISPOSABLE MISC *A*
Status: DC
Start: 2013-08-22 — End: 2014-05-07

## 2013-08-22 NOTE — Telephone Encounter (Signed)
Laverta requesting prescription(s) Large pull ups to be sent to [facility].      Is the patient out of this medication? yes.    Supply of medication left? 0  days   Does the patient have questions regarding the medication for the nurse/doctor? no

## 2013-08-22 NOTE — Telephone Encounter (Signed)
Incontinence script printed, please fax to [facility].

## 2013-08-25 NOTE — Telephone Encounter (Signed)
Can you get this patient on Kay's schedule at [facility] sooner than November.  She is a Quadraplegic in a wheelchair.  Marisue Humble said to have Joyce Gross see her.

## 2013-08-29 ENCOUNTER — Telehealth: Payer: Self-pay | Admitting: Internal Medicine

## 2013-08-29 NOTE — Telephone Encounter (Signed)
Cindy Ochoa, mother of Cindy Ochoa, is calling to schedule an appointment for Cindy Ochoa because she is very lightheaded in the morning when she wakes up, and thinks her blood pressure is running too low.  Please call her back at [phone].

## 2013-08-29 NOTE — Telephone Encounter (Signed)
Emergency transportation arranged

## 2013-08-29 NOTE — Telephone Encounter (Signed)
Call from the patient.  Reporting that she would like to schedule a follow up appointment as soon as possible.  States she notices that for the last 7-10 days, she has experienced c/o lightheadedness after getting up in her chair .  States the lightheadedness clears after an hour.  Eating and drinking well. States " I drink water all day long" Concerned that her BP is too low.    No other complaints .      PLAN: Appointment scheduled as requested .

## 2013-08-30 ENCOUNTER — Ambulatory Visit: Payer: Self-pay | Admitting: Urology

## 2013-08-30 ENCOUNTER — Ambulatory Visit: Payer: Self-pay | Admitting: Cardiovascular Disease

## 2013-08-30 ENCOUNTER — Telehealth: Payer: Self-pay

## 2013-08-30 ENCOUNTER — Telehealth: Payer: Self-pay | Admitting: Internal Medicine

## 2013-08-30 ENCOUNTER — Encounter: Payer: Self-pay | Admitting: Cardiovascular Disease

## 2013-08-30 VITALS — BP 90/40 | HR 60 | Temp 97.7°F

## 2013-08-30 DIAGNOSIS — N390 Urinary tract infection, site not specified: Secondary | ICD-10-CM

## 2013-08-30 LAB — PREGNANCY TEST, SERUM: Preg,Serum: NEGATIVE

## 2013-08-30 LAB — CBC AND DIFFERENTIAL
Baso # K/uL: 0 10*3/uL (ref 0.0–0.1)
Basophil %: 0.1 % (ref 0.1–1.2)
Eos # K/uL: 0 10*3/uL (ref 0.0–0.4)
Eosinophil %: 0.6 % — ABNORMAL LOW (ref 0.7–5.8)
Hematocrit: 42 % (ref 34–45)
Hemoglobin: 13.9 g/dL (ref 11.2–15.7)
Lymph # K/uL: 2.8 10*3/uL (ref 1.2–3.7)
Lymphocyte %: 39 % (ref 19.3–51.7)
MCV: 94 fL (ref 79–95)
Mono # K/uL: 0.5 10*3/uL (ref 0.2–0.9)
Monocyte %: 7.4 % (ref 4.7–12.5)
Neut # K/uL: 3.8 10*3/uL (ref 1.6–6.1)
Platelets: 70 10*3/uL — ABNORMAL LOW (ref 160–370)
RBC: 4.5 MIL/uL (ref 3.9–5.2)
RDW: 14.4 % (ref 11.7–14.4)
Seg Neut %: 52.9 % (ref 34.0–71.1)
WBC: 7.2 10*3/uL (ref 4.0–10.0)

## 2013-08-30 LAB — BASIC METABOLIC PANEL
Anion Gap: 13 (ref 7–16)
CO2: 19 mmol/L — ABNORMAL LOW (ref 20–28)
Calcium: 8.6 mg/dL — ABNORMAL LOW (ref 8.8–10.2)
Chloride: 104 mmol/L (ref 96–108)
Creatinine: 0.44 mg/dL — ABNORMAL LOW (ref 0.51–0.95)
GFR,Black: 159 *
GFR,Caucasian: 138 *
Glucose: 75 mg/dL (ref 60–99)
Lab: 10 mg/dL (ref 6–20)
Potassium: 3.5 mmol/L (ref 3.3–5.1)
Sodium: 136 mmol/L (ref 133–145)

## 2013-08-30 MED ORDER — ALLEVYN AG SACRUM EX PADS
1.0000 [IU] | MEDICATED_PAD | CUTANEOUS | Status: DC
Start: 2013-08-30 — End: 2015-07-05

## 2013-08-30 NOTE — Patient Instructions (Addendum)
You have had dizziness and lack of appetite which is most likely due to an infection. You will need to treat this infection symptomatically. Try to eat soft foods in your diet such as applesauce, yogurt, soups and other soft foods including plain toast if you can break it up into small pieces and are able to swallow it. Continue to drink plenty of water to prevent dehydration. Your labs showed no sign of infection and no signs of any electrolyte abnormalities. We did not find any other explication of your dizziness within our lab checks. Most likely it is related to a viral infection. If your symptoms continue or worsen or are no getting better within the next week, call the office to let us know.     For the sacral ulcer, please do follow up with wound care next week on Friday. You should change the dressing every other day with the Allevyn dressing. Monitor for any worsening of the ulcer or any signs of infection such as swelling, redness or drainage. If she has any of these please call.     Call if you have any questions: [phone]    Signed by Vergie Living, MD as of: 08/30/2013  at: 2:14 PM  Internal Medicine PGY-1

## 2013-08-30 NOTE — Progress Notes (Signed)
Urine culture was obtained by catheterizing her loop and sent for culture.

## 2013-08-30 NOTE — Telephone Encounter (Signed)
Dr Alycia Patten would like to speak with Dr Konrad Felix regarding life care plan per patient lawyer Louisa Second.    Dr Donnetta Hutching will be available today between 11 am-12 pm or Friday.   Please call Dr Donnetta Hutching at [phone].

## 2013-08-30 NOTE — Telephone Encounter (Signed)
Call from the pt's transportation ; verifying that the pt will be transported today to appointment

## 2013-08-30 NOTE — Telephone Encounter (Signed)
Attnetion to Ladell Pier, Tim called  from [facility] to confirm they will be picking up the patient today from home to bring to appointment. If any questions, please call [phone]

## 2013-08-30 NOTE — Telephone Encounter (Signed)
CALLED PT TO SCHEDULE APPOINTMENT WITH KAY . LEFT MESSAGE FOR PT MOTHER TO GIVE ME A CALL. THANK YOU

## 2013-08-30 NOTE — Progress Notes (Addendum)
[facility] Progress Note    Reason For Visit:   Chief Complaint   Patient presents with   . Dizziness   . Anorexia       Subjective:      Cindy Ochoa is 27 y.o. 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     The new ulcer under her right buttock was discovered last week by one of her nursing aides. She will be evaluated next Friday by wound care for this ulcer.      She has been dizzy for a week. Every morning when she gets up she feels dizzy and has to tilt back for half an hour at least. The frequency of dizziness has been increasing and today she has had it on and off throughout the whole day, including now.     She has had a loss of appetite which has been ongoing of and on since last week, but this became persistent after Monday. She was able to eat Monday morning, but since then she hasn't been able to eat anything. She has been still been able to drink water with "3 big jugs cups per day". She has tried to force food such as bacon, bagels and a banana down, but it will not. It does not hurt.      She denies denies headaches, fevers, chills, nausea, vomiting, chest pain, shortness of breath, and trouble with bowel movements. No one else is sick at home. She had a three year Implenon placed last year since which she has not had a period. She has had no increase in her breast size. Her last bowel movement was Monday and was normal. She has a nephrostomy tube that is still producing her normal amount of urine without any changes in coloration. Her temperature has been 99.8 with a thermometer at home.      Medications:     Current Outpatient Prescriptions on File Prior to Visit   Medication Sig Dispense Refill   . incontinence supply disposable Large pull ups - use up to 5 x daily  Diagnosis:  788.34 Incontinence without sensory awareness  150 each   5   . cephalexin (KEFLEX) 500 MG capsule Take 1 capsule (500 mg total) by mouth 3 times daily  21 capsule  0   . docusate sodium (DOK) 100 MG capsule Take 1 capsule (100 mg total) by mouth 2 times daily  60 capsule  11   . nicotine (NICODERM CQ) 7 MG/24HR patch Place 1 patch onto the skin daily   Remove & discard patch after 24 hours.  30 patch  3   . BISAC-EVAC 10 MG suppository PLACE 1 SUPPOSITORY RECTALLY DAILY AS NEEDED FOR CONSTIPATION  12 suppository  3   . ibuprofen (ADVIL,MOTRIN) 800 MG tablet Take 1 tablet (800 mg total) by mouth 3 times daily as needed for Pain   FOR PAIN  50 tablet  3   . butalbital-acetaminophen-caffeine (FIORICET, ESGIC) 50-325-40 MG per tablet Take 1 tablet by mouth every 6 hours as needed for Headaches  50 tablet  2   . Skin Protectants, Misc. OINT Apply 1 Tube topically 2 times daily    1 Tube  11   . Non-System Medication One Hoyer Lift  Diagnosis:  Quadriplegic ICD-9 344.04  1 each  0   . disposable gloves 1 box  Disposable Medium size gloves  100 each  6     No current facility-administered medications on file prior to visit.       Medications reviewed and reconciled.   Allergies:     Allergies   Allergen Reactions   . Heparin      Thrombocytopenia;    . Nitrofurantoin      Vomiting   . Vancomycin Hives     hives 2006 but tolerated Rx in 2010  pt states she had vancomycin in 04/2012.  Feels this is not true allergy  as she has received it recently - had no reaction        Review of Systems:     Pertinent positives and negatives as per HPI.     Physical Exam:     Filed Vitals:    08/30/13 1259   BP: 90/40   Pulse: 60   Temp: 36.5 C (97.7 F)   TempSrc: Temporal     Wt Readings from Last 3 Encounters:   07/25/13 86.183 kg (190 lb)   10/31/12 86.183 kg (190 lb)   10/31/12 86.183 kg (190 lb)     BP Readings from Last 3 Encounters:   08/30/13 90/40   07/25/13 110/66   06/20/13 108/64       General: NAD with some dizziness. AOx3.   HEENT: PERRL. MMM. Supple Neck.  Cardiovascular: RRR.  Normal S1, S2. No murmurs, rubs or gallops.   Pulmonary: CTAB except for crackles at bases posteriorly. (This was later reassessed, and had cleared up) No rales or ronchi.    Abdominal: Hypoactive Bowel Sounds. Soft. Nontender. Nondistended. No organomegaly or masses.   Neurological: Grossly intact. No focal neurological deficits.    Extremities: No edema. Right foot without any signs of infection.   Sacral decubitus Ulcer: 2-3 cm region of hypopigmentation with a central portion that has a slight hollow cavity without drainage.  Right foot: No signs of previous infection. No erythema, swelling, and skin intact.     Assessment and Plan:     Cindy Ochoa is 27 y.o. 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     Dizziness with loss of appetite:   - Her son recently started football practice a week or so ago and has been in contact with many other 29 year olds his age, so it is highly likely that a virus was brought home that she acquired   - She tried to eat bacon and a hard bagel; she was encouraged to try to eat softer foods. The banana was the only soft food she has tried thus far  - She will continue to drink fluids   - BMP showed no electrolyte abnormalities that would explain her symptoms   - CBC showed no elevation in her WBC   - She will continue to stay hydrated by drinking several jugs of water daily   - She will follow up if her symptoms are not improving in the next week or if they are worsening   - Her pregnancy test was negative     New sacral ulcer:   - This is hypopigmented with a central portion that has a slight hollow cavity without drainage. It appeared very similar to an image shown from her cell phone from 1 week prior  when it was first discovered.   - It was covered with an Allevyn sacral pad  - She  should change this dressing every other day with another Allevyn pad   - She will follow up with wound care next Friday     Follow up with Dr. Julian Reil on 09/27/13.      Signed by Vergie Living, MD as of: 08/30/2013  at: 1:12 PM  Internal Medicine PGY-1   Pager: [phone] Internal Medicine Faculty Attestation    I saw and discussed the patient with the above resident physician at the time of the visit.  I agree with his assessment and plan of care as documented.    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  Able to drink liquids fine.    No increase in spasms. Has implanon.  No vomiting diarrhea, cough or congestion.  Possible illness exposure through son. One, new small open area on buttocks.  Exam showed clear lungs, no nasal congestion or pharyngeal irritation, normal abdomen, urine in bag clear,  clean superficial 5 mm tear in a prior buttock scar covered with Allevyn here.  BMP and CBC showed no new abnormalities.  Pregnancy negative.  Could not extract fresh urine sample because we have no replacement bags.  Most likely she has a viral infection that should clear.  Wound to be followed up in wound clinic.  Cover with Allevyn til then.  Patient left before results and instructions could be given.  Dr. Jinny Sanders attempted to call: no answer.  He will continue to try to call patient.    Andrey Campanile, M.D., Faculty, [facility] 08/31/2013, 9:01 AM

## 2013-08-31 ENCOUNTER — Telehealth: Payer: Self-pay | Admitting: Cardiovascular Disease

## 2013-08-31 ENCOUNTER — Telehealth: Payer: Self-pay | Admitting: Internal Medicine

## 2013-08-31 NOTE — Addendum Note (Signed)
Addended by: Birdie Riddle on: 08/31/2013 09:15 AM     Modules accepted: Orders

## 2013-08-31 NOTE — Telephone Encounter (Signed)
Patient calling to speak with a nurse about her urine culture results. Call patient back at [phone] to advise.

## 2013-08-31 NOTE — Telephone Encounter (Signed)
Spoke to pt. Pt wanted to know results of CBC and urine culture.  Notified pt that we do not have results yet. Will send to Dr. Jinny Sanders for  Review.

## 2013-09-02 LAB — AEROBIC CULTURE

## 2013-09-04 ENCOUNTER — Ambulatory Visit: Payer: Self-pay | Admitting: Urology

## 2013-09-04 ENCOUNTER — Other Ambulatory Visit: Payer: Self-pay | Admitting: Urology

## 2013-09-04 MED ORDER — SULFAMETHOXAZOLE-TRIMETHOPRIM 800-160 MG PO TABS *I*
1.0000 | ORAL_TABLET | Freq: Two times a day (BID) | ORAL | Status: DC
Start: 2013-09-04 — End: 2013-09-14

## 2013-09-04 NOTE — Progress Notes (Signed)
The patient was given Bactrim for her UTI. I will see her in October after her ultrasound

## 2013-09-04 NOTE — Telephone Encounter (Signed)
Spoke to Dr. Donnetta Hutching for about 10 minutes.  Trial is now postponed til June 2015.  He would like to meet closer to that date about medical goods and services that [facility] now needs and may need in the future.  (Difficult to say with certainty.)  I expressed concern that patient may lose medicaid/medicare coverage with a large award -- and that she has medications and services covered at this point.  Dr. Donnetta Hutching said that he would speak to [name]'s attorney, Mr. Arvin Collard, about this issue.    Andrey Campanile, MD

## 2013-09-05 ENCOUNTER — Telehealth: Payer: Self-pay | Admitting: Cardiovascular Disease

## 2013-09-05 NOTE — Telephone Encounter (Signed)
Message copied by Sonia Side on Tue Sep 05, 2013  2:20 PM  ------       Message from: Alleen Borne       Created: Tue Sep 05, 2013  2:16 PM       Regarding: FW: CBC and BMP                        ----- Message -----          From: Vergie Living, MD          Sent: 08/31/2013   4:38 PM            To: Alleen Borne       Subject: CBC and BMP                                                Please find a way to call Cindy Ochoa to let her know her labs showed no electrolyte abnormalities or signs of infection. I called again today, but her number was busy for a third time.        Thanks, Sumeet        ----- Message -----          From: Edi, [facility] [place]          Sent: 08/30/2013   2:53 PM            To: Vergie Living, MD                       ------

## 2013-09-05 NOTE — Telephone Encounter (Signed)
Spoke to pt. Reviewed lab results. Pt verbalized understanding. Will call with any questions/concerns.

## 2013-09-08 ENCOUNTER — Encounter: Payer: Self-pay | Admitting: Bariatric Surgery

## 2013-09-08 ENCOUNTER — Ambulatory Visit: Payer: Self-pay | Admitting: Bariatric Surgery

## 2013-09-08 VITALS — BP 100/58 | HR 64 | Temp 95.4°F | Resp 18 | Ht 64.0 in

## 2013-09-08 DIAGNOSIS — L89159 Pressure ulcer of sacral region, unspecified stage: Secondary | ICD-10-CM

## 2013-09-11 ENCOUNTER — Telehealth: Payer: Self-pay | Admitting: Internal Medicine

## 2013-09-11 NOTE — Telephone Encounter (Signed)
Spoke to pt's mother. State that pt has been feeling lightheaded and dizzy for the past few days. Pt has a history of low BP.  Pt's mother wants to make an appt for pt. Called pt to book appt. Pt has an eye appt on Wednesday. Appt booked on 09/13/13 at 11:30 am with  Karen Kitchens, NP.

## 2013-09-11 NOTE — Telephone Encounter (Signed)
Patient's mother [name] calling to speak with Dr.Martin ASAP in regards to Marchella's blood pressure as of late. [name] asks to be contact back at [phone] to discuss.

## 2013-09-13 ENCOUNTER — Ambulatory Visit: Payer: Self-pay | Admitting: Internal Medicine

## 2013-09-13 ENCOUNTER — Encounter: Payer: Self-pay | Admitting: Emergency Medicine

## 2013-09-13 ENCOUNTER — Inpatient Hospital Stay
Admission: EM | Admit: 2013-09-13 | Disposition: A | Discharge status: Home / Self Care | Payer: Self-pay | Source: Ambulatory Visit | Attending: Internal Medicine | Admitting: Internal Medicine

## 2013-09-13 ENCOUNTER — Ambulatory Visit: Payer: Self-pay | Admitting: Ophthalmology

## 2013-09-13 VITALS — BP 84/60 | HR 89 | Temp 96.3°F | Ht 64.17 in | Wt 160.0 lb

## 2013-09-13 DIAGNOSIS — H04123 Dry eye syndrome of bilateral lacrimal glands: Secondary | ICD-10-CM

## 2013-09-13 DIAGNOSIS — I959 Hypotension, unspecified: Secondary | ICD-10-CM

## 2013-09-13 LAB — CBC AND DIFFERENTIAL
Baso # K/uL: 0 10*3/uL (ref 0.0–0.1)
Basophil %: 0.1 % (ref 0.1–1.2)
Eos # K/uL: 0.1 10*3/uL (ref 0.0–0.4)
Eosinophil %: 1.3 % (ref 0.7–5.8)
Hematocrit: 43 % (ref 34–45)
Hemoglobin: 13.7 g/dL (ref 11.2–15.7)
Lymph # K/uL: 3.1 10*3/uL (ref 1.2–3.7)
Lymphocyte %: 45.2 % (ref 19.3–51.7)
MCV: 95 fL (ref 79–95)
Mono # K/uL: 0.4 10*3/uL (ref 0.2–0.9)
Monocyte %: 6.2 % (ref 4.7–12.5)
Neut # K/uL: 3.2 10*3/uL (ref 1.6–6.1)
Platelets: 75 10*3/uL — ABNORMAL LOW (ref 160–370)
RBC: 4.5 MIL/uL (ref 3.9–5.2)
RDW: 14.6 % — ABNORMAL HIGH (ref 11.7–14.4)
Seg Neut %: 47.2 % (ref 34.0–71.1)
WBC: 6.8 10*3/uL (ref 4.0–10.0)

## 2013-09-13 LAB — URINALYSIS REFLEX TO CULTURE
Ketones, UA: NEGATIVE
Nitrite,UA: NEGATIVE
Protein,UA: NEGATIVE mg/dL
Specific Gravity,UA: 1.012 (ref 1.002–1.030)
pH,UA: 6 (ref 5.0–8.0)

## 2013-09-13 LAB — LACTATE, PLASMA: Lactate: 2.2 mmol/L (ref 0.5–2.2)

## 2013-09-13 LAB — COMPREHENSIVE METABOLIC PANEL
ALT: 16 U/L (ref 0–35)
AST: 14 U/L (ref 0–35)
Albumin: 4.6 g/dL (ref 3.5–5.2)
Alk Phos: 58 U/L (ref 35–105)
Anion Gap: 8 (ref 7–16)
Bilirubin,Total: 0.2 mg/dL (ref 0.0–1.2)
CO2: 22 mmol/L (ref 20–28)
Calcium: 9.1 mg/dL (ref 8.8–10.2)
Chloride: 110 mmol/L — ABNORMAL HIGH (ref 96–108)
Creatinine: 0.48 mg/dL — ABNORMAL LOW (ref 0.51–0.95)
GFR,Black: 154 *
GFR,Caucasian: 134 *
Glucose: 106 mg/dL — ABNORMAL HIGH (ref 60–99)
Lab: 11 mg/dL (ref 6–20)
Potassium: 3.8 mmol/L (ref 3.3–5.1)
Sodium: 140 mmol/L (ref 133–145)
Total Protein: 7.5 g/dL (ref 6.3–7.7)

## 2013-09-13 LAB — HOLD GRAY

## 2013-09-13 LAB — HOLD SST

## 2013-09-13 LAB — HOLD GREEN WITH GEL

## 2013-09-13 LAB — HOLD EXTRA URINE

## 2013-09-13 LAB — HOLD LAVENDER

## 2013-09-13 LAB — URINE MICROSCOPIC (IQ200)
Hyaline Casts,UA: 6 /lpf — ABNORMAL HIGH (ref 0–2)
RBC,UA: 1 /hpf (ref 0–2)
WBC,UA: 36 /hpf — ABNORMAL HIGH (ref 0–5)

## 2013-09-13 MED ORDER — BUTALBITAL-APAP-CAFFEINE 50-325-40 MG PO TABS *I*
1.0000 | ORAL_TABLET | Freq: Four times a day (QID) | ORAL | Status: DC | PRN
Start: 2013-09-13 — End: 2013-09-14

## 2013-09-13 MED ORDER — SODIUM CHLORIDE 0.9 % IV SOLN WRAPPED *I*
100.0000 mL/h | Status: DC
Start: 2013-09-13 — End: 2013-09-14
  Administered 2013-09-13: 100 mL/h via INTRAVENOUS

## 2013-09-13 MED ORDER — IBUPROFEN 400 MG PO TABS *I*
800.0000 mg | ORAL_TABLET | Freq: Three times a day (TID) | ORAL | Status: DC | PRN
Start: 2013-09-13 — End: 2013-09-14

## 2013-09-13 MED ORDER — DOCUSATE SODIUM 100 MG PO CAPS *I*
100.0000 mg | ORAL_CAPSULE | Freq: Two times a day (BID) | ORAL | Status: DC
Start: 2013-09-13 — End: 2013-09-14

## 2013-09-13 MED ORDER — SODIUM CHLORIDE 0.9 % IV BOLUS *I*
1000.0000 mL | Freq: Once | Status: AC
Start: 2013-09-13 — End: 2013-09-13
  Administered 2013-09-13: 1000 mL via INTRAVENOUS

## 2013-09-13 MED ORDER — CIPROFLOXACIN IN D5W 0.2 % IV SOLN *A*
400.0000 mg | Freq: Once | INTRAVENOUS | Status: AC
Start: 2013-09-13 — End: 2013-09-13
  Administered 2013-09-13: 400 mg via INTRAVENOUS
  Filled 2013-09-13: qty 200

## 2013-09-13 MED ORDER — GENTEAL PM 85-15 % OP OINT *I*
TOPICAL_OINTMENT | Freq: Every evening | OPHTHALMIC | Status: DC
Start: 2013-09-13 — End: 2017-11-05

## 2013-09-13 MED ORDER — CIPROFLOXACIN IN D5W 0.2 % IV SOLN *A*
400.0000 mg | Freq: Two times a day (BID) | INTRAVENOUS | Status: DC
Start: 2013-09-14 — End: 2013-09-14
  Administered 2013-09-14: 400 mg via INTRAVENOUS
  Filled 2013-09-13: qty 200

## 2013-09-13 MED ORDER — NICOTINE 7 MG/24HR TD PT24 *I*
1.0000 | MEDICATED_PATCH | Freq: Every day | TRANSDERMAL | Status: DC
Start: 2013-09-14 — End: 2013-09-14
  Filled 2013-09-13: qty 1

## 2013-09-13 MED ORDER — ARTIFICIAL TEARS (POLYVINYL ALCOHOL) 1.4 % OP SOLN *I*
1.0000 [drp] | Freq: Four times a day (QID) | OPHTHALMIC | Status: DC
Start: 2013-09-13 — End: 2014-03-30
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
## 2013-09-13 NOTE — Progress Notes (Signed)
[facility]  Progress Note      Name: Cindy Ochoa, Cindy Ochoa  MRN: [id number]  DOB: 10/19/1986      Date of Encounter: 09/08/2013      Medical Providers    Referring: Andrey Campanile, MD   PCP: Andrey Campanile, MD       Chief Complaint      Chief Complaint   Patient presents with   . Initial Evaluation     R ischial pressure ulcer          History of Present Ilness   Cindy Ochoa is a 27 y.o. female. She returns for a follow of visit for management of Right ischial pressure ulcer.    She has a prior history of similar pressure ulcers that have been healed in the past with better moisture control and off-loading.    Pain    09/08/13 1055   PainSc:   0 - No pain         Past Medical History   She  has a past medical history of Thrombocytopenia (Dec 2004); Pneumonia (05/25/2005 ); Depression (04/29/05); Autonomic dysfunction (04/29/2005); Neurogenic bladder (04/29/2005); Quadriparesis At C6 (04/29/2005); Muscle spasm (05/28/2005); Pneumonia (06/27/2005 ); Sepsis(995.91) (11/18/2009); Anemia (11/18/09); History of recurrent UTIs (04/29/05); Oculomotor palsy, partial (04/29/2005); Decubitus ulcer of left buttock (03/17/2010); Pneumonia, organism unspecified (05/25/2011); Heparin induced thrombocytopenia (HIT) (May 2006); Hypotension (09/14/05); Osteomyelitis of ankle or foot, left, acute (Nov 2006); 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      Allergies   She is allergic to heparin; nitrofurantoin; and vancomycin.      Medications  No current facility-administered medications for this visit.     Current Outpatient Prescriptions   Medication Sig   . sulfamethoxazole-trimethoprim (BACTRIM DS,SEPTRA DS) 800-160 MG per tablet Take 1 tablet by mouth 2 times daily   . Wound Dressings (ALLEVYN AG  SACRUM) PADS Apply 1 Units topically every other day   . incontinence supply disposable Large pull ups - use up to 5 x daily  Diagnosis:  788.34 Incontinence without sensory awareness   . docusate sodium (DOK) 100 MG capsule Take 1 capsule (100 mg total) by mouth 2 times daily   . nicotine (NICODERM CQ) 7 MG/24HR patch Place 1 patch onto the skin daily   Remove & discard patch after 24 hours.   Marland Kitchen BISAC-EVAC 10 MG suppository PLACE 1 SUPPOSITORY RECTALLY DAILY AS NEEDED FOR CONSTIPATION   . ibuprofen (ADVIL,MOTRIN) 800 MG tablet Take 1 tablet (800 mg total) by mouth 3 times daily as needed for Pain   FOR PAIN   . butalbital-acetaminophen-caffeine (FIORICET, ESGIC) 50-325-40 MG per tablet Take 1 tablet by mouth every 6 hours as needed for Headaches   . Skin Protectants, Misc. OINT Apply 1 Tube topically 2 times daily     . Non-System Medication One Hoyer Lift  Diagnosis:  Quadriplegic ICD-9 344.04   . disposable gloves 1 box Disposable Medium size gloves   . polyvinyl alcohol (LIQUIFILM TEARS) 1.4 % ophthalmic solution Place 1 drop into both eyes 4 times daily   . white petrolatum-mineral oil (GENTEAL PM) 85-15 % ophthalmic ointment Place into both eyes nightly     Facility-Administered Medications Ordered in Other Visits   Medication   . sodium chloride 0.9 % bolus 1,000 mL   . sodium chloride 0.9 %  IV   . ciprofloxacin (CIPRO) IVPB 400 mg         Review of Systems   Unchanged from prior visit.    Past Surgical History   She has past surgical history that includes Multiple injuries (04/29/2005 ); craniotomy (04/29/2005); Cervical spine surgery (04/29/2005); Cervical spine surgery (05/04/2005); Tracheostomy tube placement (05/15/05); Gastrostomy tube placement (05/15/05); IVC filter (May 2006 ); Urethral plication (12/19/2009); Left Tibia fracture (06/01/07); PICC insertion > 5 years (08/27/2012); and ileal loop urinary diversion (08/26/2012 ).      Social History   She reports that she quit smoking about 15 months ago. Her  smoking use included Cigarettes. She started smoking about 21 months ago. She has a 1.5 pack-year smoking history. She quit smokeless tobacco use about 8 years ago. She reports that she uses illicit drugs (Marijuana). She reports that she does not drink alcohol.      Family History   Her family history includes Breast cancer in an other family member; Cancer in an other family member; Diabetes in her maternal grandmother and mother; High cholesterol in her mother; Hypertension in an other family member; Osteoarthritis in her maternal grandmother; Stroke in her maternal grandfather. There is no history of Colon cancer or Thrombosis.      Vital Signs  Filed Vitals:    09/08/13 1055   BP: 100/58   Pulse: 64   Temp: 35.2 C (95.4 F)   Resp: 18   Height: 1.626 m (5\' 4" )         Physical Exam   General appearance: alert, well appearing, and in no distress, oriented to person, place, and time, acyanotic, in no respiratory distress and well hydrated.   Heart: normal rate and regular rhythm.    Lungs: unlabored respirations, no intercostal retractions or accessory muscle use.   Extremities: extremities normal, atraumatic, no cyanosis or edema. pulses not examined.   Neurologic: sensation not intact in feet       Wound #6   Location: Right ischium   Well healed    Wound #7   Location: sacrum   Well healed  Peri-wound skin healthy      Assessment   Cindy Ochoa is a 27 y.o. female with well healed Stage 2 pressure ulcer. S/p ileal loop diversion procedure for better urinary drainage.     Plan   Wound debrided today: yes. See procedure note.     Local wound care: Continue skin barrier cream and better offloading to protect from further breakdown and prevent recurrence.     Follow up prn.Marland Kitchen

## 2013-09-13 NOTE — First Provider Contact (Signed)
ED Medical Screening Exam Note    Initial provider evaluation performed by   ED First Provider Contact    Date/Time Event User Comments    09/13/13 1238 ED Provider First Contact [name] ANN Initial Face to Face Provider Contact        Patient with hx quadriplegia, WC bound with chills, sweats, chronic /recurrent UTI,s low BP ? urosepsis  Orders placed:  LABS and urine, saline     Patient requires further evaluation.     [name] ANN Helen, NP, 09/13/2013, 12:38 PM

## 2013-09-13 NOTE — ED Notes (Signed)
Sent to the ED from the Medical clinic with c/o chills, fever with reported hypotension.Has a hx.UTI's.
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
## 2013-09-13 NOTE — Progress Notes (Signed)
Pt transported from [facility] to the Emergency Department for further evaluation and treatment.  DATAS handoff tool provided by RN.      Pt transferred in own electric wheel chair.

## 2013-09-13 NOTE — ED Notes (Signed)
Low air loss mattress ordered for patient. E. Adron Geisel, CRN

## 2013-09-13 NOTE — Progress Notes (Addendum)
Reason For Visit: The encounter diagnosis was Hypotension.      HPI:  Cindy Ochoa is 27 y.o. 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    She was seen recently by urology and urine was positive  Results for CAMYA, CERUTTI (MRN [id number]) as of 09/13/2013 12:25   Ref. Range 08/30/2013 19:15 08/30/2013 19:15   Aerobic Culture No range found Proteus mirabilis Klebsiella pneumoniae   She was treated with bactrim for 7 days  She still has pills left, indicating she has not been faithful about taking them bid  She has had sepsis in the past    Recently seen by plastic for wound that had healed    She has had autonomic dysfunction in the past     She was previously trach     Here with her mother    Medications:     Current Outpatient Prescriptions   Medication Sig   . polyvinyl alcohol (LIQUIFILM TEARS) 1.4 % ophthalmic solution Place 1 drop into both eyes 4 times daily   . white petrolatum-mineral oil (GENTEAL PM) 85-15 % ophthalmic ointment Place into both eyes nightly   . sulfamethoxazole-trimethoprim (BACTRIM DS,SEPTRA DS) 800-160 MG per tablet Take 1 tablet by mouth 2 times daily   . Wound Dressings (ALLEVYN AG SACRUM) PADS Apply 1 Units topically every other day   . incontinence supply disposable Large pull ups - use up to 5 x daily  Diagnosis:  788.34 Incontinence without sensory awareness   . docusate sodium (DOK) 100 MG capsule Take 1 capsule (100 mg total) by mouth 2 times daily   . nicotine (NICODERM CQ) 7 MG/24HR patch Place 1 patch onto the skin daily   Remove & discard patch after 24 hours.   Marland Kitchen BISAC-EVAC 10 MG suppository PLACE 1 SUPPOSITORY RECTALLY DAILY AS NEEDED FOR CONSTIPATION   . ibuprofen (ADVIL,MOTRIN) 800 MG tablet Take 1 tablet (800 mg total) by mouth 3 times daily as needed for Pain   FOR PAIN   . butalbital-acetaminophen-caffeine (FIORICET, ESGIC) 50-325-40 MG per tablet Take 1 tablet by mouth every 6 hours  as needed for Headaches   . Skin Protectants, Misc. OINT Apply 1 Tube topically 2 times daily     . Non-System Medication One Hoyer Lift  Diagnosis:  Quadriplegic ICD-9 344.04   . disposable gloves 1 box Disposable Medium size gloves     No current facility-administered medications for this visit.       Medication list reconciled this visit    Allergies:     Allergies   Allergen Reactions   . Heparin      Thrombocytopenia;    . Nitrofurantoin      Vomiting   . Vancomycin Hives     hives 2006 but tolerated Rx in 2010  pt states she had vancomycin in 04/2012.  Feels this is not true allergy  as she has received it recently - had no reaction        Social history      History   Substance Use Topics   . Smoking status: Former Smoker -- 0.50 packs/day for 3 years     Types: Cigarettes     Start date: 12/09/2011     Quit date: 05/25/2012   . Smokeless tobacco: Former Neurosurgeon     Quit date: 04/29/2005  Comment: Quit Smoking July 2014   . Alcohol Use: No       Review of Systems     CONSTITUTIONAL: Appetite good, no fevers, night sweats or weight loss  CV: No chest pain, shortness of breath or peripheral edema  RESPIRATORY: No cough, wheezing or dyspnea  GI: No nausea/vomiting, abdominal pain, or change in bowel habits  GU: urostomy is working  NEURO: No MS changes,  weakness    Physical Exam:     Patient is alert and oriented and in no distress. Sitting in motorized wheelchair  Cardiovascular: Normal rate, regular rhythm, S1 normal and S2 normal, no edema.     Pulmonary/Chest:decrease bilateral   Abdominal: soft, non-tender, bowel sounds are normal,  Urostomy in right abdominal wall, urine draining   Skin: Skin is warm and dry. No rash noted.       Filed Vitals:    09/13/13 1141   BP: 84/60   Pulse: 89   Temp: 35.7 C (96.3 F)   TempSrc: Temporal   Height: 1.63 m (5' 4.17")   Weight: 72.576 kg (160 lb)     Wt Readings from Last 3 Encounters:   09/13/13 72.576 kg (160 lb)   07/25/13 86.183 kg (190 lb)   10/31/12 86.183 kg  (190 lb)     BP Readings from Last 3 Encounters:   09/13/13 84/60   09/08/13 100/58   08/30/13 90/40           RESULTS:         Component Value Date/Time    NA 136 08/30/2013 1432    K 3.5 08/30/2013 1432    CREAT 0.44* 08/30/2013 1432    GLU 75 08/30/2013 1432    CA 8.6* 08/30/2013 1432     Lab Results   Component Value Date    ALT 15 08/22/2012    AST 32 08/22/2012    GGT 35 11/19/2009        Lab Results   Component Value Date    TSH 0.67 09/01/2011     Lab Results   Component Value Date    WBC 7.2 08/30/2013    HGB 13.9 08/30/2013    HCT 42 08/30/2013    MCV 94 08/30/2013    PLT 70* 08/30/2013     Lab Results   Component Value Date    CREAT 0.44* 08/30/2013       ASSESSMENT/PLAN:     Hypotension in quadriplegics concern for sepsis         ED for evaluation       If negative will consider starting florinef       Follow up after ED visit         Karen Kitchens,  Nurse Practitioner  [facility]      [facility] Faculty Attestation    I discussed the patient with the above resident physician at the time of the visit.  I agree with her assessment and plan of care as documented.    Covered:  27 yo woman with quadriplegia, ureterostomy and recent UTI.  Hypotensive and dizzy.  Send to ED.    Andrey Campanile, M.D. 09/18/2013, 8:54 AM

## 2013-09-13 NOTE — ED Notes (Signed)
Sent to ED from PCP with c/o dizziness increasing over past month, chills and low blood pressure.  Recent UTI with urostomy, on antibiotics currently. Unable to obtain IV access. Will contact IV team. Mother with at bedside.

## 2013-09-14 ENCOUNTER — Telehealth: Payer: Self-pay | Admitting: Internal Medicine

## 2013-09-14 DIAGNOSIS — N390 Urinary tract infection, site not specified: Secondary | ICD-10-CM | POA: Diagnosis present

## 2013-09-14 LAB — CBC
Hematocrit: 38 % (ref 34–45)
Hemoglobin: 12.1 g/dL (ref 11.2–15.7)
MCV: 95 fL (ref 79–95)
Platelets: 68 10*3/uL — ABNORMAL LOW (ref 160–370)
RBC: 4 MIL/uL (ref 3.9–5.2)
RDW: 14.6 % — ABNORMAL HIGH (ref 11.7–14.4)
WBC: 6.3 10*3/uL (ref 4.0–10.0)

## 2013-09-14 LAB — BASIC METABOLIC PANEL
Anion Gap: 10 (ref 7–16)
CO2: 20 mmol/L (ref 20–28)
Calcium: 8.6 mg/dL — ABNORMAL LOW (ref 8.8–10.2)
Chloride: 108 mmol/L (ref 96–108)
Creatinine: 0.6 mg/dL (ref 0.51–0.95)
GFR,Black: 143 *
GFR,Caucasian: 124 *
Glucose: 102 mg/dL — ABNORMAL HIGH (ref 60–99)
Lab: 11 mg/dL (ref 6–20)
Potassium: 3.4 mmol/L (ref 3.3–5.1)
Sodium: 138 mmol/L (ref 133–145)

## 2013-09-14 MED ORDER — CIPROFLOXACIN HCL 500 MG PO TABS *I*
500.0000 mg | ORAL_TABLET | Freq: Two times a day (BID) | ORAL | Status: AC
Start: 2013-09-14 — End: 2013-09-23
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
## 2013-09-14 NOTE — Telephone Encounter (Signed)
Reviewed by [facility] Social Worker  No action required at this time by Child psychotherapist

## 2013-09-14 NOTE — Progress Notes (Signed)
Utilization Management    Level of Care Inpatient as of the date 09/13/2013      Lucita Montoya, RN     Pager: [phone]

## 2013-09-14 NOTE — Discharge Summary (Addendum)
Name: Cindy Ochoa MRN: [id number] DOB: 07-Dec-1986     Admit Date: 09/13/2013   Date of Discharge: 09/14/2013    Discharge Attending Physician: Levin Erp      Hospitalization Summary    CONCISE NARRATIVE: Patient was sent to the ED after being seen in clinic for dizziness because she was found to be hypotensive with SBP in the high 80s.   UA was suggestive of possible recurrent or persistent infection in the setting of recent uncompleted course of antibiotics.  Antibiotics were started for potential UTI. IVFs were given. BPs were stable in the high 90s-low 100s. Further history revealed that her dizziness has been intermittent over the past 2 months and only occurs when she sits upright in her wheelchair in the morning and resolves with leaning back. There is suspicion for autonomic dysreflexia or orthostasis related to her spinal cord injury but patient had to leave due to a family situation prior to further evaluation.  She was instructed to follow up with her PCP office for further evaluation and consider addition of midodrine.         OTHER PENDING TEST RESULTS: Urine culture  Blood culture  SIGNIFICANT MED CHANGES: Yes  Stopped Bactrim  Added Cipro      Signed: Adele Schilder, PA  On: 09/14/2013  at: 7:45 AM

## 2013-09-14 NOTE — Telephone Encounter (Addendum)
Spoke to pt to followup. Pt states that Dr. Claire Shown called back stating that pt can come in tomorrow morning.   Pt states that Dr. Claire Shown called and was able to reassure her that her son will be taken care of while pt is in the hospital.  Pt states that she will be able to go to the hospital around 10:00 am tomorrow morning after dropping her son off with her sister.

## 2013-09-14 NOTE — Progress Notes (Addendum)
[place] [facility]  Transportation Request Form / Physician Certification Statement   Please fax request with face sheet to [facility] Coordinator: Fax [phone], Phone [phone] or [facility]: Fax [phone], Phone [phone]    Patient Name:  Cindy Ochoa     Date of Birth:  1986/06/12    Date of Service: September 14, 2013  Requested time of pick up: 9:30am or ASAP    Patient Location:  [id number]     MR#  [id number]  Patient Destination: [address] [place] [address]  Number of steps into house?: Ramp    Requestor's Name: Delford Field, LMSW Call Back Number: [phone] : medicaid    Transport for (check what type of treatment or service, at least one):    [x]  Discharge              []  Diagnostic Test      []  Evaluation Test         []  Procedure      Specify what type of treatment or service: discharge to home   Is this treatment or service available at sending facility?:      []  Yes    [x]  No    Requested Mode of Transport  [x]  One man Chairmobile** []  Two man Chairmobile**  (** See instruction  form)  Leg extension required:     []  Yes  []  No  Patient has own chair:  [x]  Yes    []  No   Wheelchair size:         []  Standard []  X-wide  []  XX-wide  []  Round Trip []  One Way  []  Stretcher Zenaida Niece (no medical attention needed)    []  [facility]  []  [facility]  VENDOR: [facility] Medi Trans________   1. Medical condition that necessitates this mode of transport (i.e. oxygen, bed ridden, etc.):  Quadriplegic has own electric W/C  With her.     2. What medical services are to be provided by crew?   []  Oxygen Monitoring:  Amount:      LPM  Can patient self-administer oxygen?  []  Yes []  No - What is limitation?        Does patient have own portable tank?    []  Yes     []  No   []  Airway Monitoring []  Monitor IV infusion []  Suctioning     []  Cardiac Monitoring []  Vital Signs  []  PRN Meds  []  Other_______________________________  [x]  None    3. Infection control needs (i.e.  ORSA/VRE/Cdiff)       [x]  Yes    []  No    4. What specific handling is required?    [x]  Positioning []  Orthopedic Device  []  Restraints []  Other:        Reason for above:  []  Flight Risk     []  Severe Pain []  Morbid Obesity       [x]  Fall Precaution   []  Ortho/Spine Precaution         []  Decubitis > Stage 2 []  Risk of injury to self/others    5.  Patient mental status? [x]  Normal cognition []  Disoriented      Specify:        []  Psychological Disorder Specify:       6. At time of transport is bed confinement ordered?  []  Yes     [x]  No              Is patient bed confined?    []   Yes     [x]  No    Medical condition for bed confinement:     Social work name:  Delford Field, LMSW         Date:  September 14, 2013        Signature:___________________________________________________    Title:   []  MD  []  PA  []  NP  []  CNS  []  RN  [x]  SW  []  Discharge Planner
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
## 2013-09-14 NOTE — Telephone Encounter (Signed)
Strongly advised pt to return to the ED.

## 2013-09-14 NOTE — Telephone Encounter (Signed)
Returned call to [place], strongly advised pt to return to ED. Attempted to call pt, left message to return to the ED for treatment. Will notify Dr. Julian Reil and our SW.

## 2013-09-14 NOTE — Telephone Encounter (Signed)
Patient returning missed call, stated that she will be coming back to ED, please call to discuss, [phone]

## 2013-09-14 NOTE — Plan of Care (Signed)
Problem: Safety  Goal: Patient will remain free of falls  Outcome: Progressing towards goal  Will seek to ensure pt remains free of falls      Intervention: Assess Falls Risk  Fall risk assessment completed at start of shift   Intervention: Initiate falls precautions, if indicated  2/4 side rails up, stop sign posted, wheelchair within in reach,   Intervention: Offer toileting every 1-2 hours  Urostomy in place   Intervention: Hourly visual checks  Frequent hourly rounding   Intervention: Appropriate transfer devices in place  pts personal wheelchair in room   Intervention: Educate patient and family about falls prevention  encouraged pt to use call light       Problem: Mobility  Goal: Patient's functional status is maintained or improved  Outcome: Progressing towards goal  Encouraging pt to continue to use same independence while in hospital as done at home  Intervention: Encourage independent activity per patient's ability  Continue to encourage pts independent functional status from home during hospitalization   Intervention: Turn patient every 2 hours and prn  Pt able to change and position independent, uses call light if assistance is required   Intervention: Perform active/passive ROM as ordered/tolerated  Placed IPCs placed as per orders   Intervention: Educate patient/family on adaptive devices  Pt has personal wheelchair completely educated on proper use       Problem: Psychosocial  Goal: Demonstrates ability to cope with illness  Outcome: Progressing towards goal  Intervention: Encourage verbalization of feelings/concerns/expectations  Pt expressed concern about 31 year old son living arrangements during pt's hospitalization; paged provider and pt was able to verbalize these concerns to the provider directly.   Intervention: Provide quiet environment  Closed sliding door to pt's room to provide more quiet environment   Intervention: Monitor for depression/anxiety/grieving  Observed pt was tearful and upset  about not being home with son  Intervention: Assess Pt/Fam needs during frequent rounding  Frequent hourly rounding completed, was able to observe pt's concern about son  Intervention: Facilitate Pt/Fam update with medical service team  Paged provider about pt's concerns about son's living arrangements, was able to speak to the provider directly

## 2013-09-14 NOTE — Discharge Instructions (Signed)
Brief Summary of Your Hospital Course: You came to the hospital with a urinary tract infection and low blood pressure.  You were treated with IV antibiotics and IV fluids.  You needed to leave the hospital because of a family situation      What to do after you leave the hospital:  Follow up in clinic within the week.  Discuss with them if you should start a medication to help keep your blood pressure up.    Recommended diet: regular    Recommended activity: as tolerated    Home care:       If you experience any of these symptoms fever, chills, shortness of breath, lightheadedness 24 hours or more after discharge: Dr Daphine Deutscher      If you experience any of these symptoms within the first 24 hours after discharge call Dr. Claire Shown at [phone]      Smoking    Smoking can increase your chances of developing chronic health problems or worsen conditions you already have.  If you smoke you should quit. Smoking cessation information has been given to you for your review to help you quit.  Medications to help you quit are available.  Ask your doctor if you would like to receive these medications.       Medications  Your doctor has prescribed medications to improve or manage your condition (such as antibiotics, pain medications, ACE inhibitors, beta blockers, diuretics and aspirin).  You should take them as prescribed by your doctor.  Ask your doctor for any questions regarding these medications.      Diet  A healthy diet is important to help you stay well.  Some health conditions require you to be on a special diet.  Reading food labels is helpful when you are on a special diet.  Follow instructions from your doctor for any other special dietary requirement.     What to do if your condition changes?  Once you leave the hospital you should contact your doctor's office to make a follow up appointment.  If at any time you have any questions or concerns or your condition gets worse, contact your physician.  If you can not reach your  physician or you develop life threatening symptoms such as trouble breathing or chest pain you should go to the closest Emergency Department.     Education  You may receive additional information on your specific condition before you are discharged.  Please ask your nurse or doctor for your information before you leave the hospital.

## 2013-09-14 NOTE — Progress Notes (Signed)
Discharge paper work given and read through with patient.  Transportation being set up by ED social work.  Patient has all of her belongings.

## 2013-09-14 NOTE — Telephone Encounter (Signed)
Cindy Ochoa called requesting a call back from the nurse. She stated that pt was in the ED yesterday and checked herself out this morning ADA. She stated pt was being treated for a severe UTI that cause low blood pressure. Please call her at the number above.

## 2013-09-14 NOTE — Progress Notes (Signed)
Contacts: Pt; [facility] Case Manager Oletta Cohn [phone]    Intervention: Pt is requesting to leave hospital states she has child care issues and can't stay hospitalized. Pt refused SWK offer to help with arranging child care. Pt is a quadriplegic and receives 103 hours/week of [facility] home health aides thru [facility]. Her case Manager is Oletta Cohn. SWK received a phone call from [facility] who expressed concern regarding patient's social situation. Patient has very limited supports. Pt's mother suffers from a drug addiction and is not reliable. Pt has 2 sisters one is still in high school and one works full time Pt has primary custody of her 52 year old son as well as the 33 year old sister. Pt has worked hard to obtain custody and does not want to lose it. Pt's son recently got kicked off the school bus and patient's sister missed school to watch him. Sister has already missed too much school and can miss additional days to watch her nephew. The other sister is at risk of losing her job, and can not take time off to watch him either. Pt has no other supports and is fearful of involving CPS/foster care as she might lose custody rights. Patient does not want to stay in hospital and have SWK explore care options for her son, sister,    Pt has no concerns regarding her own care needs.  Per protocol, SWK alerted APS: Val Caudwell, Re: AMA discharge. SWK addressed issues with her case manager: Oletta Cohn. Edson Snowball agreed to discuss possible care alternatives with pt. I.e. [facility], crisis nursery for her son, sister in the event she has to be re- hospitalized.    [address] [place], [place], [address]

## 2013-09-15 ENCOUNTER — Telehealth: Payer: Self-pay | Admitting: Internal Medicine

## 2013-09-15 ENCOUNTER — Encounter: Payer: Self-pay | Admitting: Emergency Medicine

## 2013-09-15 ENCOUNTER — Emergency Department
Admission: EM | Admit: 2013-09-15 | Disposition: A | Discharge status: Home / Self Care | Payer: Self-pay | Source: Ambulatory Visit | Attending: Nephrology | Admitting: Nephrology

## 2013-09-15 DIAGNOSIS — N390 Urinary tract infection, site not specified: Principal | ICD-10-CM

## 2013-09-15 MED ORDER — CIPROFLOXACIN HCL 500 MG PO TABS *I*
500.0000 mg | ORAL_TABLET | Freq: Once | ORAL | Status: DC
Start: 2013-09-15 — End: 2013-09-15

## 2013-09-15 MED ORDER — CEFTRIAXONE SODIUM 1G IN DEXTROSE 50 ML *I*
1.0000 g | Freq: Once | INTRAVENOUS | Status: DC
Start: 2013-09-15 — End: 2013-09-15

## 2013-09-15 MED ORDER — AMOXICILLIN-POT CLAVULANATE 875-125 MG PO TABS *I*
1.0000 | ORAL_TABLET | Freq: Two times a day (BID) | ORAL | Status: AC
Start: 2013-09-15 — End: 2013-09-25

## 2013-09-15 MED ORDER — AMPICILLIN-SULBACTAM IN NS 3 GM *I*
3000.0000 mg | Freq: Once | INTRAMUSCULAR | Status: AC
Start: 2013-09-15 — End: 2013-09-15
  Administered 2013-09-15: 3000 mg via INTRAVENOUS
  Filled 2013-09-15: qty 100

## 2013-09-15 MED ORDER — CEPHALEXIN 500 MG PO CAPS *I*
500.0000 mg | ORAL_CAPSULE | Freq: Four times a day (QID) | ORAL | Status: AC
Start: 2013-09-15 — End: 2013-09-25

## 2013-09-15 NOTE — ED Notes (Signed)
IV line placed by IV team infiltrated while pt was receiving antibiotics. IV stopped and d/c'd, warm cloth placed to site. Covering provider aware and will attempt IV access when able.

## 2013-09-15 NOTE — ED Notes (Signed)
IV team paged and will be down to see patient.

## 2013-09-15 NOTE — ED Notes (Cosign Needed)
Patient arrival by EMS for urinary tract infection.  She states she was in observation but had to leave due to her child needed to be placed in a safe place so she could be in the hospital.  Foley is patent and draining yellow urine.  Denies any pain at this time.

## 2013-09-15 NOTE — Telephone Encounter (Signed)
[name] has returned to [facility] ED today.   She signed herself out of the hospital before completing IV antibiotics for a family emergency.   She returned today to complete therapy.     She is aware of her appointment with Dr. Julian Reil on 09/27/2013 and will call us with any concerns between her discharge and upcoming appointment.

## 2013-09-15 NOTE — Telephone Encounter (Addendum)
Spoke to pt. Pt was waiting for the ambulance to pick her up. Reassured pt that she was making a good decision to go back to the hospital to get treatment.

## 2013-09-15 NOTE — Consults (Signed)
Social Work called regarding pt needing a stretcher rather than a wheelchair. Pt does not have her own chair and is not able to use a "normal wheelchair." Social Work spoke with [facility]. [facility] will only transport pt via Doctor, general practice. Social Work called medicaid and explained the information regarding pt. Medicaid approved stretcher. [facility] notified.    Fredderick Severance, LMSW  [phone]

## 2013-09-15 NOTE — Telephone Encounter (Addendum)
Review of AVS:    Date of Discharge:  09/14/13    Reason for admission:  Uti (Urinary Tract Infection)   Your diagnoses also included: Quadriplegia, C5-C7, Incomplete, Neurogenic Bladder Disorder    Discharged to :   Home    On Coumadin or Warfarin:  no    Follow up appointment:    09/27/2013 3:35 PM Cleophus Molt, MD [facility]     10/12/2013 9:30 AM Iline Oven, NP [facility]     11/29/2013 11:00 AM Fluellen, Almetta Lovely, NP [facility]

## 2013-09-15 NOTE — Progress Notes (Signed)
IV Team: Called by staff to place an PIV for IV Cipro. Patient was seen by IV service yesterday in the emergency department and it took 4 attempts to establish access. Today this Clinical research associate evaluated patient and was able to only place a 22 gauge PIV in right wrist, only being able to insert catheter half way. Nurse Clotilde Dieter and [place] made aware.
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
## 2013-09-15 NOTE — ED Notes (Cosign Needed)
Received call from social work medicaid will not Music therapist mobile due to patient sent home by wheel chair mobile several last visits. Social work will come to talk to patient.  Discharge papers signed .  Awaiting transport.

## 2013-09-15 NOTE — ED Notes (Signed)
Was here yesterday andf left AMA d/t family issue. Returns for UTI today

## 2013-09-15 NOTE — ED Notes (Cosign Needed)
Patient talking on phone demanding she go home by stretcher mobile and not wheel chair mobile.  Social work notified. Transport time changed due to change in transportation.  Patient resting quietly.  Refused sandwich or drink.  Agitated over having to be in hospital.

## 2013-09-15 NOTE — Consults (Signed)
Social work met with pt. Pt will be picked up by [employer] ETA 8:15. Social Work also helped pt obtain prescriptions.    Fredderick Severance, LMSW  [phone]

## 2013-09-15 NOTE — ED Notes (Cosign Needed)
Nephrology in to see patient.  Stated she can be discharged to home on PO madications.  Patient states she threw out medications because some one told her IV antibiotics work better than pills. Resting quietly.
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
## 2013-09-15 NOTE — Discharge Instructions (Signed)
You were seen in the ER for a UTI.    Based on your culture results and your previous culture results, we are giving you an iv antibiotic (unasyn) and then sending you home on an oral antibiotic (augmentin).    Take Augmentin twice daily for 10 days     These antibiotics should treat the infection, but bacterial susceptibilities are always changing so you need to be vigilant and return to the ER if you develop fever >100.5, pain with urination, or any other worrisome symptoms.

## 2013-09-15 NOTE — Consults (Addendum)
[facility]  Transportation Request Form / Physician Certification Statement   Please fax request with face sheet to [facility] Coordinator: Fax [phone], Phone [phone] or [place] [facility]: Fax [phone], Phone [phone]    Patient Name:  Cindy Ochoa     Date of Birth:  09-30-1986  Date of Service: September 15, 2013  Requested time of pick up: asap  Patient Location:  AC07/AC-07R     MR#  [id number]  Patient Destination: [address] [place] [address]     Number of steps into house?: 0  Requestor's Name: Boone Master Call Back Number: [phone] Payor : medicaid    Transport for (check what type of treatment or service, at least one):    [x]  Discharge              []  Diagnostic Test      []  Evaluation Test         []  Procedure      Specify what type of treatment or service: discharge home   Is this treatment or service available at sending facility?:      []  Yes    [x]  No    Requested Mode of Transport  []  One man Chairmobile** []  Two man Chairmobile**  (** See instruction  form)  Leg extension required:     []  Yes  []  No  Patient has own chair:  []  Yes    []  No   Wheelchair size:         []  Standard []  X-wide  []  XX-wide  []  Round Trip [x]  One Way  [x]  Stretcher Zenaida Niece (no medical attention needed)    []  [facility]  []  [facility]  VENDOR: __[place]_______   1. Medical condition that necessitates this mode of transport (i.e. oxygen, bed ridden, etc.):  Uses wheelchair at baseline    2. What medical services are to be provided by crew?   []  Oxygen Monitoring:  Amount:      LPM  Can patient self-administer oxygen?  []  Yes []  No - What is limitation?        Does patient have own portable tank?    []  Yes     []  No   []  Airway Monitoring []  Monitor IV infusion []  Suctioning     []  Cardiac Monitoring []  Vital Signs  []  PRN Meds  []  Other_______________________________  [x]  None    3. Infection control needs (i.e. ORSA/VRE/Cdiff)       []  Yes    [x]  No  4. What specific handling  is required?    [x]  Positioning []  Orthopedic Device  []  Restraints []  Other:        Reason for above:  []  Flight Risk     []  Severe Pain []  Morbid Obesity       [x]  Fall Precaution   []  Ortho/Spine Precaution         []  Decubitis > Stage 2 []  Risk of injury to self/others  5.  Patient mental status? [x]  Normal cognition []  Disoriented      Specify:        []  Psychological Disorder Specify:       6. At time of transport is bed confinement ordered?  []  Yes     [x]  No              Is patient bed confined?    []  Yes     [x]  No    Medical condition for  bed confinement:     Social work name:  Boone Master         Date:  September 15, 2013        Signature:___________________________________________________    Title:   []  MD  []  PA  []  NP  []  CNS  []  RN  [x]  SW  []  Discharge Planner

## 2013-09-15 NOTE — ED Notes (Cosign Needed)
Resting quietly.  No complaints at this time.

## 2013-09-16 LAB — AEROBIC CULTURE

## 2013-09-19 ENCOUNTER — Encounter: Payer: Self-pay | Admitting: Gastroenterology

## 2013-09-19 LAB — BLOOD CULTURE
Bacterial Blood Culture: 0
Bacterial Blood Culture: 0

## 2013-09-20 ENCOUNTER — Telehealth: Payer: Self-pay | Admitting: Ophthalmology

## 2013-09-20 NOTE — Telephone Encounter (Signed)
MRX sent to patient.

## 2013-09-20 NOTE — Telephone Encounter (Signed)
Patient would like copy of MRX mailed to her home address.

## 2013-09-27 ENCOUNTER — Encounter: Payer: Self-pay | Admitting: Internal Medicine

## 2013-09-27 ENCOUNTER — Ambulatory Visit: Payer: Self-pay | Admitting: Internal Medicine

## 2013-09-27 VITALS — BP 104/64 | HR 88 | Temp 97.7°F | Ht 64.02 in | Wt 179.0 lb

## 2013-09-27 DIAGNOSIS — G8254 Quadriplegia, C5-C7 incomplete: Secondary | ICD-10-CM

## 2013-09-27 DIAGNOSIS — N319 Neuromuscular dysfunction of bladder, unspecified: Secondary | ICD-10-CM

## 2013-09-27 MED ORDER — IBUPROFEN 800 MG PO TABS *I*
800.0000 mg | ORAL_TABLET | Freq: Three times a day (TID) | ORAL | Status: DC | PRN
Start: 2013-09-27 — End: 2013-12-26

## 2013-09-27 MED ORDER — BISACODYL 10 MG RE SUPP *I*
10.0000 mg | Freq: Every day | RECTAL | Status: DC | PRN
Start: 2013-09-27 — End: 2013-12-26
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
## 2013-09-29 ENCOUNTER — Ambulatory Visit: Payer: Self-pay | Admitting: Urology

## 2013-10-03 ENCOUNTER — Ambulatory Visit: Payer: Self-pay | Admitting: Urology

## 2013-10-03 ENCOUNTER — Encounter: Payer: Self-pay | Admitting: Urology

## 2013-10-03 VITALS — BP 90/55 | HR 87 | Temp 97.7°F

## 2013-10-03 DIAGNOSIS — Z1389 Encounter for screening for other disorder: Secondary | ICD-10-CM
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
## 2013-10-09 ENCOUNTER — Ambulatory Visit: Admit: 2013-10-09 | Payer: Self-pay | Source: Ambulatory Visit | Admitting: Physical Medicine and Rehabilitation

## 2013-10-09 ENCOUNTER — Ambulatory Visit: Payer: Self-pay | Admitting: Urology

## 2013-10-11 ENCOUNTER — Ambulatory Visit
Admit: 2013-10-11 | Discharge: 2013-10-11 | Disposition: A | Discharge status: Home / Self Care | Payer: Self-pay | Source: Ambulatory Visit | Attending: Urology | Admitting: Urology

## 2013-10-12 ENCOUNTER — Ambulatory Visit: Payer: Self-pay | Admitting: Urology

## 2013-11-22 ENCOUNTER — Ambulatory Visit: Payer: Self-pay | Admitting: Internal Medicine

## 2013-11-29 ENCOUNTER — Ambulatory Visit: Payer: Self-pay | Admitting: Obstetrics and Gynecology

## 2013-11-30 ENCOUNTER — Encounter: Payer: Self-pay | Admitting: Gastroenterology

## 2013-11-30 ENCOUNTER — Ambulatory Visit: Payer: Self-pay | Admitting: Internal Medicine

## 2013-12-12 ENCOUNTER — Telehealth: Payer: Self-pay | Admitting: Internal Medicine

## 2013-12-12 NOTE — Telephone Encounter (Signed)
Left VM for patient to call back

## 2013-12-12 NOTE — Telephone Encounter (Signed)
Patient has a chest cold the last couple of days and is requesting a prescription for Mucinex be sent to the [facility].

## 2013-12-13 ENCOUNTER — Telehealth: Payer: Self-pay | Admitting: Internal Medicine

## 2013-12-13 NOTE — Telephone Encounter (Signed)
Spoke to patient  Patient taking suppository every other day--patient is having loose stool.  Patient still having bloating and discomfort  She stated she was told she needed to come back in if the loose stool continued.  Patient would like to set up apt for this week.   Patient scheduled with Karen Kitchens, NP for Friday 12/15/13 at 3:40pm.  Patient instructed to call office if symptoms worsen between now and apt.  Patient verbally confirmed understanding and has no further questions at this time.  Patient instructed to call our office for any concerns or questions.

## 2013-12-13 NOTE — Telephone Encounter (Signed)
Patient states the suppositories she's taking is giving her diarrhea and stomach problems. Patient wants to schedule an appointment. Call patient back at [phone] to advise.

## 2013-12-15 ENCOUNTER — Ambulatory Visit: Payer: Self-pay | Admitting: Internal Medicine

## 2013-12-15 ENCOUNTER — Telehealth: Payer: Self-pay | Admitting: Internal Medicine

## 2013-12-15 ENCOUNTER — Encounter: Payer: Self-pay | Admitting: Internal Medicine

## 2013-12-15 ENCOUNTER — Ambulatory Visit
Admit: 2013-12-15 | Discharge: 2013-12-15 | Disposition: A | Discharge status: Home / Self Care | Payer: Self-pay | Source: Ambulatory Visit | Attending: Internal Medicine | Admitting: Internal Medicine

## 2013-12-15 VITALS — BP 100/56 | HR 76 | Temp 97.5°F | Ht 64.17 in | Wt 150.0 lb

## 2013-12-15 DIAGNOSIS — G8254 Quadriplegia, C5-C7 incomplete: Secondary | ICD-10-CM

## 2013-12-15 DIAGNOSIS — R197 Diarrhea, unspecified: Secondary | ICD-10-CM

## 2013-12-15 LAB — CBC AND DIFFERENTIAL
Baso # K/uL: 0 10*3/uL (ref 0.0–0.1)
Basophil %: 0.1 % (ref 0.1–1.2)
Eos # K/uL: 0 10*3/uL (ref 0.0–0.4)
Eosinophil %: 0.4 % — ABNORMAL LOW (ref 0.7–5.8)
Hematocrit: 44 % (ref 34–45)
Hemoglobin: 14.4 g/dL (ref 11.2–15.7)
Lymph # K/uL: 3.4 10*3/uL (ref 1.2–3.7)
Lymphocyte %: 46.9 % (ref 19.3–51.7)
MCH: 31 pg/cell (ref 26–32)
MCHC: 33 g/dL (ref 32–36)
MCV: 94 fL (ref 79–95)
Mono # K/uL: 0.5 10*3/uL (ref 0.2–0.9)
Monocyte %: 7 % (ref 4.7–12.5)
Neut # K/uL: 3.3 10*3/uL (ref 1.6–6.1)
Platelets: 77 10*3/uL — ABNORMAL LOW (ref 160–370)
RBC: 4.7 MIL/uL (ref 3.9–5.2)
RDW: 15 % — ABNORMAL HIGH (ref 11.7–14.4)
Seg Neut %: 45.6 % (ref 34.0–71.1)
WBC: 7.2 10*3/uL (ref 4.0–10.0)

## 2013-12-15 LAB — TSH: TSH: 2.17 u[IU]/mL (ref 0.27–4.20)

## 2013-12-15 MED ORDER — PSYLLIUM 28.3 % PO POWD *I*
ORAL | Status: DC
Start: 2013-12-15 — End: 2014-03-22

## 2013-12-15 NOTE — Telephone Encounter (Signed)
Spoke with Lauretta at [facility] who states that assessment for commode chair is not a skilled need.    [employer], and spoke to [place], who states that she needs more information regarding what exactly the patient needs.    Pt is a quad and a standard bedside commode does not seem appropriate but she states that [facility] can call [facility] if they have any questions regarding patient's past orders.    Left message for Osvaldo Human to please call back the office with specifics.

## 2013-12-15 NOTE — Progress Notes (Signed)
Subjective:     Patient ID: Cindy Ochoa is a 27 y.o. female.    HPI    reoccurring diarrhea  No abd pain  Has loose stool daily, can be watery  No fever or chills  She quadriparesis little feeling abdomen    Bowel regime colace,   bisacodyl   Every other day  Bowel movements 1-2 day  Watery and loose    On antibiotics last month  Worse since that time    Patient's medications, allergies, past medical, surgical, social and family histories were reviewed and updated as appropriate.    Review of Systems   Constitutional: Negative for fever, chills and fatigue.   Respiratory: Negative for cough, chest tightness, shortness of breath and wheezing.    Cardiovascular: Negative for chest pain, palpitations and leg swelling.   Gastrointestinal: Positive for diarrhea. Negative for nausea, vomiting, abdominal pain, constipation and rectal pain.   Musculoskeletal: Negative for myalgias and arthralgias.   Skin: Negative for color change and rash.             Objective:   Physical Exam    Patient is alert and oriented and in no distress.   Sitting in wheel chair   urostomy clear yellow uring  Cardiovascular: Normal rate, regular rhythm, S1 normal and S2 normal, no edema.     Pulmonary/Chest: Lungs are clear   Abdominal: soft, non-tender, bowel sounds are normal,   There is no hepatosplenomegaly.   Skin: Skin is warm and dry. No rash noted.           Assessment/plan      Diarrhea         Check c-diff         Check tsh and cbc         Stop  bisacodyl and use as prn if no stool every other day          Start metamucil daily         Call if no improvement or any change     Naidelin Gugliotta A Briannon Boggio, NP

## 2013-12-15 NOTE — Telephone Encounter (Signed)
[name] with [facility] is requesting a referral for the patient to [facility] in order for her to be evaluated for a new commode chair. She can be reached if necessary at [phone].

## 2013-12-18 ENCOUNTER — Encounter: Payer: Self-pay | Admitting: Gastroenterology

## 2013-12-19 NOTE — Telephone Encounter (Signed)
Cindy Ochoa called today requesting status of referral for another commode chair.  [name] states that pt needs a special chair with arms and lid.  Please further advise.

## 2013-12-20 ENCOUNTER — Telehealth: Payer: Self-pay

## 2013-12-20 NOTE — Telephone Encounter (Signed)
Left message for Osvaldo Human as Clinical research associate was unable to speak to her directly due to answering service.    Left call back number.

## 2013-12-20 NOTE — Telephone Encounter (Signed)
LM notifying patient of test results below.

## 2013-12-20 NOTE — Telephone Encounter (Signed)
Message copied by Marianne Sofia on Wed Dec 20, 2013  9:59 AM  ------       Message from: Alleen Borne       Created: Tue Dec 19, 2013  8:50 AM                       ----- Message -----          From: Genia Hotter, NP          Sent: 12/18/2013   2:21 PM            To: Audree Camel Team              Please call       No signs of infection        Thyroid normal  ------

## 2013-12-25 ENCOUNTER — Encounter: Payer: Self-pay | Admitting: Gastroenterology

## 2013-12-26 ENCOUNTER — Other Ambulatory Visit: Payer: Self-pay | Admitting: Internal Medicine

## 2013-12-27 NOTE — Telephone Encounter (Signed)
Patient calling back to see what the status of this request was.  The commode chair she has now is to low for her.

## 2013-12-27 NOTE — Telephone Encounter (Signed)
Left message for [facility] asking what needs to be done to get new commode chair - patient has Medicaid.

## 2014-01-02 NOTE — Telephone Encounter (Signed)
Per Gershon Mussel at [facility] [employer] send script and they will contact patient.

## 2014-01-03 NOTE — Telephone Encounter (Signed)
faxed

## 2014-01-03 NOTE — Telephone Encounter (Signed)
Ordered.  [name], MD

## 2014-01-05 ENCOUNTER — Ambulatory Visit: Payer: Self-pay | Admitting: Internal Medicine

## 2014-01-05 ENCOUNTER — Encounter: Payer: Self-pay | Admitting: Internal Medicine

## 2014-01-05 VITALS — BP 100/68 | HR 86 | Temp 98.8°F | Ht 64.17 in | Wt 178.0 lb

## 2014-01-05 DIAGNOSIS — G8254 Quadriplegia, C5-C7 incomplete: Secondary | ICD-10-CM

## 2014-01-05 DIAGNOSIS — F3289 Other specified depressive episodes: Secondary | ICD-10-CM

## 2014-01-05 DIAGNOSIS — N319 Neuromuscular dysfunction of bladder, unspecified: Secondary | ICD-10-CM

## 2014-01-06 ENCOUNTER — Encounter: Payer: Self-pay | Admitting: Internal Medicine
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
## 2014-01-08 NOTE — Addendum Note (Signed)
Addended by: Lavone Orn on: 01/08/2014 08:07 AM     Modules accepted: Orders

## 2014-01-08 NOTE — Addendum Note (Signed)
Addended byLavone Orn on: 01/08/2014 05:00 AM     Modules accepted: Orders

## 2014-01-10 ENCOUNTER — Encounter: Payer: Self-pay | Admitting: Gastroenterology

## 2014-01-11 ENCOUNTER — Encounter: Payer: Self-pay | Admitting: Internal Medicine

## 2014-01-19 ENCOUNTER — Telehealth: Payer: Self-pay | Admitting: Internal Medicine

## 2014-01-19 NOTE — Telephone Encounter (Signed)
Cindy Ochoa, mother of Cindy Ochoa, is calling to find out the name of an orthopedic doctor Vaishnavi saw, possibly as long as 9 years ago, for a spinal condition.  Please call Cindy Ochoa at [phone].

## 2014-01-22 NOTE — Telephone Encounter (Signed)
-----   Message -----     From: Ocie Doyne, MD     Sent: 01/21/2014  10:18 AM       To: Lowella Curb    Dr. Chandra Batch in Ortho. [place]

## 2014-01-30 ENCOUNTER — Ambulatory Visit: Payer: Self-pay | Admitting: Orthopedic Surgery

## 2014-02-01 ENCOUNTER — Ambulatory Visit: Payer: Self-pay | Admitting: Orthopedic Surgery

## 2014-02-01 ENCOUNTER — Encounter: Payer: Self-pay | Admitting: Orthopedic Surgery

## 2014-02-01 ENCOUNTER — Other Ambulatory Visit: Payer: Self-pay | Admitting: Orthopedic Surgery

## 2014-02-01 VITALS — BP 104/68 | HR 58 | Ht 65.0 in | Wt 155.0 lb

## 2014-02-01 DIAGNOSIS — M542 Cervicalgia: Secondary | ICD-10-CM

## 2014-02-01 DIAGNOSIS — Z981 Arthrodesis status: Secondary | ICD-10-CM
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
## 2014-02-12 ENCOUNTER — Ambulatory Visit: Payer: Self-pay | Admitting: Obstetrics and Gynecology

## 2014-02-14 ENCOUNTER — Telehealth: Payer: Self-pay | Admitting: Internal Medicine

## 2014-02-14 NOTE — Telephone Encounter (Signed)
Sheena Freeman/[employer] calling because she sent over a request for  PRI screen on 1/23, and again on 2/5, but has not received a response yet. Sheena states patient needs the [facility] screen in order to maintain her TBI waiver benefits. Sheena needs the [facility] screen returned to her today, fax to [phone]. She can be reached at [phone].

## 2014-02-14 NOTE — Telephone Encounter (Signed)
Left message for Cindy Ochoa to call me back for clarification - we did fax the physician order for personal care form in January - is this what she is referring to?

## 2014-02-15 MED ORDER — NON-SYSTEM MEDICATION *A*
Status: DC
Start: 2014-02-15 — End: 2017-11-05

## 2014-02-15 NOTE — Telephone Encounter (Signed)
Done  I put in [place] folder

## 2014-02-15 NOTE — Telephone Encounter (Signed)
[name] requested script for [facility] AND SCREEN to be  Faxed to her at [phone]

## 2014-02-23 ENCOUNTER — Encounter: Payer: Self-pay | Admitting: Obstetrics and Gynecology

## 2014-02-23 ENCOUNTER — Ambulatory Visit: Payer: Self-pay | Admitting: Obstetrics and Gynecology

## 2014-02-23 VITALS — BP 90/62 | HR 94 | Ht 64.17 in | Wt 152.0 lb

## 2014-02-23 DIAGNOSIS — Z975 Presence of (intrauterine) contraceptive device: Secondary | ICD-10-CM

## 2014-02-23 DIAGNOSIS — N760 Acute vaginitis: Secondary | ICD-10-CM

## 2014-02-23 DIAGNOSIS — B9689 Other specified bacterial agents as the cause of diseases classified elsewhere: Secondary | ICD-10-CM

## 2014-02-23 DIAGNOSIS — Z3040 Encounter for surveillance of contraceptives, unspecified: Secondary | ICD-10-CM

## 2014-02-23 DIAGNOSIS — Z01419 Encounter for gynecological examination (general) (routine) without abnormal findings: Secondary | ICD-10-CM

## 2014-02-23 LAB — POCT VAGINAL WET MOUNT
CLUE CELLS,POC: NEGATIVE
KOH FOR FUNGUS: NEGATIVE
TRICHOMONAS, POC: NEGATIVE
WHIFF TEST: POSITIVE
YEAST,POC: NEGATIVE

## 2014-02-23 MED ORDER — METRONIDAZOLE 500 MG PO TABS *I*
500.0000 mg | ORAL_TABLET | Freq: Two times a day (BID) | ORAL | Status: AC
Start: 2014-02-23 — End: 2014-03-02

## 2014-02-23 NOTE — Patient Instructions (Signed)
Healthy Practices for Women of all Ages     In addition to your regular OB/GYN history, physical, cholesterol screening, Pap smear, and other recommended cancer screening tests, we ask that you review this list of healthy practices.  Modifying your daily activities according to these practices may improve your overall health and well-being.  In the event of questions about this list, ask your doctor or health care provider.  Additionally, there are specially written pamphlets available, which offer further information.    Diet and Exercise:  - Limit fat and cholesterol; emphasize fruits, grains and vegetables.  - Consume dairy products or use calcium supplementation for adequate calcium intake (1200 mg or more).  - Add folic acid supplementation 0.4 mg (400 micrograms, present in most daily multivitamins) at least two months before considering pregnancy to reduce the risks of birth defects.  - Participate in regular exercise for 30 minutes at least five times a week, and consider weight training.    Injury Prevention:  - Seat/lap belts should be worn while in a moving car.  - Helmet should be used when using motorcycles, bicycles, roller blades and ATVs or skiing.  - Place approved smoke detectors in your house and replace the batteries twice a year.    - Guns and other firearms should be stored unloaded and in a locked area.  Trigger locks should be used as well.  - Consider CPR training for household members.    Dental Health:  - Schedule regular visits to the dentist.  - Floss and brush with fluoride toothpaste daily.    Immunizations:  - A tetanus/diphtheria booster shot (d/T) is recommended every 10 years.  - An MMR vaccine is recommended for non-pregnant women born after [birth year] without proof of immunity of documentation of previous immunization.  Adults who are susceptible to varicella (chicken pox) should be vaccinated.  - Influenza vaccine is indicated yearly for women age 29 and older, or at any age  based on medical history and exposure.  Pregnant women over [redacted] weeks gestation should receive the flu vaccine as well.  - Pneumococcal pneumonia vaccine is indicated for age 29 and older once in your life.  - Hepatitis A and/or B vaccines are recommended for high-risk individuals.    Substance Abuse:  - Stop smoking; do not use any other tobacco products.  - Avoid alcohol use when driving, boating, swimming or operating other machinery. Avoid excessive use of alcoholic beverages.  - Recreational drug use (marijuana, cocaine, etc.) is dangerous and can be habit-forming.    Sexual Behavior:  - Be sure to use contraception if pregnancy is not desired.  - Regular use of female or female condoms with spermicide helps prevent STDs.  - Consider HIV testing if:  1. You have had more than one sexual partner.  2. You have had any STDs.  3. You have used intravenous drugs.  4. You have a sexual partner with these (the above) risk factors.  5. Your sexual partner has had female homosexual exposure.  6. You received a blood transfusion during 1978-1985.    Breast Health:  - Breast self-examinations should be done monthly (after your period).  - A mammogram should be done once between ages 29-40, then every 1-2 years based on risk factors.  If there is a strong family history of premenopausal breast cancer, you should start with mammograms earlier than age 68.    Colon Cancer Surveillance:  - Beginning at age 10, stool occult blood  be done annually and/or sigmoidoscopy (scope examination of the colon) every 3-5 years.    Women and Alcohol  For women, alcohol use carries some unique and special concerns. Most studies on alcohol have been on men, but we now know women may respond differently to alcohol.   Concerns: Liver damage, cancer, menstrual cycle changes, birth defects, fetal alcohol syndrome, intoxication, nutrition and weight management.  All women should avoid all alcohol during pregnancy.  Help for Problem  Drinking:   One in every three members of Alcoholics Anonymous (AA) is now a woman.  AA meetings are held regularly and have led the way in demonstrating the effectiveness of self-help.   Outpatient and Inpatient Treatment are also available.  Ask your health care provider for a referral.   The first step in successful treatment is to recognize that there is a problem and accept help.    Dietary and Supplemental Calcium  It is important that you build and protect your bone mass through a program of regular exercise and proper nutrition with adequate amounts of calcium in your diet. Dairy products are considered to be the most important sources of calcium.    Reviewed 09/2010
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
## 2014-02-26 ENCOUNTER — Telehealth: Payer: Self-pay

## 2014-02-26 DIAGNOSIS — B9689 Other specified bacterial agents as the cause of diseases classified elsewhere: Secondary | ICD-10-CM

## 2014-02-26 DIAGNOSIS — N760 Acute vaginitis: Secondary | ICD-10-CM

## 2014-02-26 LAB — CHLAMYDIA PLASMID DNA AMPLIFICATION: Chlamydia Plasmid DNA Amplification: 0

## 2014-02-26 LAB — N. GONORRHOEAE DNA AMPLIFICATION: N. gonorrhoeae DNA Amplification: 0

## 2014-02-26 MED ORDER — METRONIDAZOLE 0.75 % VA GEL *I*
1.0000 | Freq: Two times a day (BID) | VAGINAL | Status: DC
Start: 2014-02-26 — End: 2014-03-22

## 2014-02-26 NOTE — Telephone Encounter (Signed)
Patient was prescribed metroNIDAZOLE (FLAGYL) 500 MG tablet on Friday, she took a dose Friday evening, Saturday morning and Saturday evening.  She was vomiting all day Saturday.  She did not take any on Sunday or so far today and no vomiting.  She spoke with the pharmacy who advised that maybe the dose is too high for her and advised her to call this office.

## 2014-02-26 NOTE — Telephone Encounter (Signed)
Reviewed options for Tindamax vs vaginal gel. Patient prefers to have her aid assist with Metrogel applications. medication information reviewed and Rx sent.

## 2014-02-26 NOTE — Telephone Encounter (Signed)
Pt started Metronidiazole on 3/6 eve and vomited twice on Saturday. Pt denies any alcohol " I don't drink, ever ", denies any exposure to anyone with a GI virus. She did not take medication on 3/8 and feels better. Pharmacy confirmed. Will forward to G. Fluellen NP

## 2014-02-28 ENCOUNTER — Ambulatory Visit: Payer: Self-pay | Admitting: Physical Medicine and Rehabilitation

## 2014-03-22 ENCOUNTER — Encounter: Payer: Self-pay | Admitting: Internal Medicine

## 2014-03-22 ENCOUNTER — Ambulatory Visit: Payer: Self-pay | Admitting: Internal Medicine

## 2014-03-22 VITALS — BP 100/62 | HR 88 | Temp 95.2°F | Ht 64.17 in | Wt 152.0 lb

## 2014-03-22 DIAGNOSIS — Z9889 Other specified postprocedural states: Secondary | ICD-10-CM

## 2014-03-22 MED ORDER — DISPOSABLE GLOVES MISC *A*
Status: DC
Start: 2014-03-22 — End: 2014-05-03

## 2014-03-26 ENCOUNTER — Encounter: Payer: Self-pay | Admitting: Physical Medicine and Rehabilitation

## 2014-03-26 ENCOUNTER — Ambulatory Visit: Payer: Self-pay | Admitting: Physical Medicine and Rehabilitation

## 2014-03-26 VITALS — BP 138/72 | HR 85 | Ht 64.0 in | Wt 152.0 lb

## 2014-03-26 DIAGNOSIS — M62838 Other muscle spasm: Secondary | ICD-10-CM

## 2014-03-26 DIAGNOSIS — S14105A Unspecified injury at C5 level of cervical spinal cord, initial encounter: Secondary | ICD-10-CM

## 2014-03-26 DIAGNOSIS — N319 Neuromuscular dysfunction of bladder, unspecified: Secondary | ICD-10-CM

## 2014-03-26 DIAGNOSIS — K592 Neurogenic bowel, not elsewhere classified: Secondary | ICD-10-CM | POA: Insufficient documentation

## 2014-03-26 NOTE — Progress Notes (Addendum)
Reason For Visit:   Chief Complaint   Patient presents with    Follow-up       HPI:      Cindy Ochoa is a 28 y.o. year old lady who presents for follow up evaluation.      Hx of Tracheostomy: Not closed since 2010 per patient.  Still with opening at previous site.  Can move air through neck.    No fevers, no chills.  She is wanting to meet with [facility] regarding any more functional gain in her symptoms.  I discussed unlikely given duration of symptoms but will defer to them.  She also inquired about braces to prevent contractures.  Double edged sword as I would be concerned about pressure ulcer from brace.         Review of Systems:   Otherwise negative unless stated in HPI    Physical Exam:    Filed Vitals:    03/22/14 1249   BP: 100/62   Pulse: 88   Temp: 35.1 C (95.2 F)   TempSrc: Temporal   Height: 1.63 m (5' 4.17")   Weight: 68.947 kg (152 lb)     Wt Readings from Last 3 Encounters:   03/22/14 68.947 kg (152 lb)   02/23/14 68.947 kg (152 lb)   02/01/14 70.308 kg (155 lb)     BP Readings from Last 3 Encounters:   03/22/14 100/62   02/23/14 90/62   02/01/14 104/68       General:  Aaox3, nad, pleasant   HEENT: ncat, opening at previous trach site  Lungs: ctab, no wheezing, no crackles  Heart: rrr, no murmurs  Abdomen: no sensation below brest  Extremities: slight curvature of lower extremity  Neurologic: able to move upper extremities, no sensation distally    Labs Reviewed and Notable for:        ASSESSMENT and Plan:     28 y.o. year old woman with quadriplegia who presents for follow up evaluation      1. H/O tracheostomy    - referral to ENT for tracheostomy site closure    2. Wrote for 2 packages of gloves per month.       Cindy Doyne, MD 03/26/2014 5:40 AM    Patient's care discussed with resident at time of visit.  Agree with plans regarding trach issues.    Electronically signed by Jake Samples. Marelin Tat, MD 03/26/2014 at 8:35 AM

## 2014-03-26 NOTE — Patient Instructions (Addendum)
We will refer to you to a prosthetist to help with bracing for your upper and lower limbs.    We will refer you to occupational therapy to help train your aids and you.  They will provide you with a home program of stretching.    We will refer you to Dr. Lamount Cohen, who will be able to discuss baclofen pumps with you and see if you are a candidate.    We think you should consider a spinal cord injury group.  They meet once a month and it is open to everyone.     You can contact [facility] at [phone] or Amy [phone] who will give you times/dates.    [employer]: [phone]    Next time you come to clinic we will address your annual needs.    Please follow up in 3 months.
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
## 2014-03-28 ENCOUNTER — Telehealth: Payer: Self-pay | Admitting: Pain Medicine
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
## 2014-03-29 ENCOUNTER — Ambulatory Visit: Payer: Self-pay

## 2014-03-30 ENCOUNTER — Other Ambulatory Visit: Payer: Self-pay | Admitting: Internal Medicine

## 2014-03-30 ENCOUNTER — Other Ambulatory Visit: Payer: Self-pay | Admitting: Ophthalmology

## 2014-04-03 ENCOUNTER — Ambulatory Visit: Payer: Self-pay | Admitting: Urology

## 2014-04-04 ENCOUNTER — Ambulatory Visit: Payer: Self-pay

## 2014-04-05 ENCOUNTER — Ambulatory Visit: Payer: Self-pay

## 2014-04-05 DIAGNOSIS — M24573 Contracture, unspecified ankle: Secondary | ICD-10-CM

## 2014-04-05 DIAGNOSIS — G8254 Quadriplegia, C5-C7 incomplete: Secondary | ICD-10-CM

## 2014-04-05 NOTE — Progress Notes (Signed)
Patient seen for evaluation for AFOs to address ankle contractures secnodary to SCI.  She stated that she has not worn AFOs in the past and that she would like one only for the right side.  Her RLE presents with 30 deg PF contracture as well as varus and forefoot adduction tendency.  She is correctable to neutral in the frontal plane, but is rigidly fixed at 30 deg PF.  AFO options were discussed and it was determined that she would like an AFO to maintain frontal plane alignment and to prevent further PF contracture.  She was not interested in an AFO for the left side, although her Rx indicated bilateral AFOs.  An impression was taken for a DRAFO Flex to provide soft support to her foot/ankle.  She selected black plastic.  Patient to return for fitting/delivery in 3 weeks.

## 2014-04-09 ENCOUNTER — Encounter: Payer: Self-pay | Admitting: Urology

## 2014-04-09 ENCOUNTER — Ambulatory Visit: Payer: Self-pay | Admitting: Urology

## 2014-04-09 VITALS — BP 87/54 | HR 95 | Resp 18

## 2014-04-09 DIAGNOSIS — N319 Neuromuscular dysfunction of bladder, unspecified: Secondary | ICD-10-CM

## 2014-04-09 MED ORDER — VITAMIN C 1000 MG PO TABS *I*
1000.0000 mg | ORAL_TABLET | Freq: Every day | ORAL | Status: DC
Start: 2014-04-09 — End: 2015-03-16

## 2014-04-09 MED ORDER — HOLLISTER REPLACEMENT FILTERS MISC
Status: DC
Start: 2014-04-09 — End: 2014-05-07
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
## 2014-04-10 ENCOUNTER — Ambulatory Visit: Payer: Self-pay | Admitting: Rehabilitative and Restorative Service Providers"

## 2014-04-10 DIAGNOSIS — S14105A Unspecified injury at C5 level of cervical spinal cord, initial encounter: Secondary | ICD-10-CM

## 2014-04-11 NOTE — Progress Notes (Signed)
Physical Medicine & Rehabilitation  Occupational Therapy  Splint Documentation      Name:  Cindy Ochoa  MRN:  [id number]  Diagnosis:    Encounter Diagnosis   Name Primary?    Spinal cord injury at C5-C7 level without injury of spinal bone, initial encounter Yes     Today's Date:  04/11/2014  rx 03/26/14 Vonda Antigua:   Dx: C6 AIS A SCI  Please evaluate and treat for a home exercise/stretching program (train aids), splints for daily use and strengthening        S: Per pt here to OT for splints only then to PT for leg exercise instruction for aide assist training on HEP , uses electric wheelchair , operates joy stick with RUE. Is left hand dominant, needs to be able to use phone. [name] attends today as aide with pt. Has 2 aides, not 24 hours.   Objective/Orthotic Training:  Chart reviewed, discussed pt desire to decrease digit tightness and need for tightness for tenodesis function. Designed and fabricated bilateral custom thermoplastic splints    Affected Body Part:  Right and Left hand and forarm      Splint Type: forearm based resting pan style with wrist neutral, MP 20 degrees from full extension with mid over pressure  pip and DIP for static IP extension , thumbs free      Wearing Schedule: daytime as instructed by MD, educated to start with short 30 minute periods followed by skin check, educated to alternate splints not wear both at same time to preserve function.      Patient Education:  Instructed patient and aide  in splint care, wear & precautions, Patient and aide  demonstrated understanding of instructions     Assessment:  Splint fits well      Motion, extension  ,Right, with light stretch to IPs    Date 04/10/14      MP PIP DIP      index 19 51 16      middle 17 45 30      Ring  17 45 5      little 17 34 35          Motion, extension  ,Left     Date 04/10/14      MP PIP DIP      index 25 55 15      middle 25 45 15      Ring  20 45 0      little 21 [id number] 5                Plan:  Splint check appointment  made     Minutes:  Total timed: 60     Timed mins: 0   Untimed: orthotic training 60   Unbillable: 0

## 2014-04-13 ENCOUNTER — Ambulatory Visit: Payer: Self-pay | Admitting: Otolaryngology

## 2014-04-13 VITALS — BP 96/53 | HR 85 | Ht 64.0 in | Wt 152.0 lb

## 2014-04-13 DIAGNOSIS — J9504 Tracheo-esophageal fistula following tracheostomy: Secondary | ICD-10-CM

## 2014-04-13 NOTE — Patient Instructions (Signed)
 [facility]  Fax # [phone]  PREPROCEDURE PATIENT INSTRUCTIONS       NAME: Cindy Ochoa    SURGERY  DATE: Tuesday May 5       1) On Monday May 4 CALL [phone] between 2:30 PM and 7:00 PM to find out: a): the time to arrive at the [facility] and b) the time of your procedure. *Please note surgery start time is approximate. You may want to bring something to help pass the time. PLEASE ARRIVE ON TIME.      2) DIRECTIONS TO [facility]: On the day of your procedure (Tuesday May 5), park in the parking garage and take the elevator/stairs to Level One (1), then follow the walkway to the [employer]. Walk past the Information Desk in the lobby, towards the Lab & Outpatient Services. Follow the GREEN (G) ceiling tags to the GREEN elevators. (Valet Parking is available outside the front entrance of the hospital between 8:00 AM and 5:00 PM.)   ? [facility]: take the GREEN elevators to the Basement (Level B - two floors down). Follow the [facility] on the wall to the [facility] and check in with the receptionist at the desk.    3) Before coming to the hospital, remove all makeup, (including mascara), jewelry (including wedding band and watch), hair accessories and nail polish from toes and fingers. Do not bring any valuables (money, ID, wallet, purse, jewelry, or contact lenses).     4) Call your surgeon if you become ill before the procedure day, i.e. fever, chills, nausea, vomiting, sore throat.    5) A. You are going home on the same day as your surgery  ? Bring in any crutches or braces your surgeon has instructed you to bring.  ? Most people are ready for discharge one half hour to two hours after returning to the Surgical Center from the Post-Anesthesia Care Unit (PACU). You will be given discharge instructions before you go home.    B. You are staying overnight / being admitted to the hospital after surgery for approximately 0  days.  ? Leave any luggage or crutches in your car until after your procedure. Then your family can bring into the hospital once you are in your room.    6) Your family will be directed to a waiting area when you are taken to surgery.  ? We ask that only one or two family members accompany you on the day of your procedure.         ? No children under the age of 52 are allowed as visitors in [facility]  ? Your family will be notified when your surgery is completed and you have arrived on the patient care unit.     7) Health standards require that a responsible adult must accompany any patient who has received anesthetics or sedation and is going home the same day.   ? You must arrange a ride home before coming to surgery.                     [facility]  Fax # [phone]  PREPROCEDURE PATIENT INSTRUCTIONS        8) EATING GUIDELINES: Follow the instructions below unless directed by your surgeon. Starting: Monday May 4  ? Nothing to eat (or drink) after midnight (No candy, mints, gum, or water) on the day of your surgery.  ? If you are  told that you may have liquids, the following liquids are permitted up to three (3) hours before the time of your procedure: water, apple juice (without pulp), clear carbonated beverages and black coffee or tea  *these are the only fluids allowed. sugar or sweetener is okay. (NO cream, creamer, milk or cremora)    ? Failure to follow these instructions could lead to a delay or cancellation of your procedure.    9) MEDICATIONS:   ? On the morning of surgery, take only the medications listed below at the usual time or before leaving for the  hospital: N/A  ? Medications should only be taken with no more than one ounce of water.     ? Aspirin/anti-inflammatory products: Do not take any aspirin products for 14 days before the procedure date. Avoid non-steroidal anti-inflammatory agents such as Ibuprofen (Advil, Motrin) or Naproxen (Aleve) 14 days before the  procedure.        Tylenol (Acetaminophen) may be taken if needed.    Marland Kitchen MALES: Do not use any medications for erectile dysfunction (Viagra, Cialis, Levitra) one week prior to your surgery.    If you are taking any blood thinners, such as Coumadin, Heparin, Lovenox, Plavix or Pletal please contact your physician regarding when/if to stop these before surgery.                ? If you have any questions, please contact the [facility] during regular business hours at [phone], 8:00 AM to 4:00 PM, Monday to Friday.    11) Financial questions should be directed to [phone]                  How to Prevent Surgical Site Infections    About this topic   A surgical wound is a cut in the skin from a procedure. Doctors are either taking something out of the body or treating a disease inside the body. Any cut made on the skin gives germs easy access. This may result in infection. This infection can start at any time from 2 to 3 days to 2 to 3 weeks after the surgery. The infection may spread deeper in the body if it is not treated. You will start to feel unwell and serious health problems may happen. An infection may also cause the cut to open up again.  General   These things can raise your risk of getting infection after surgery:   Poor nutritional status   If you are a diabetic and you do not take your drugs regularly   Smoking or use of tobacco products   Being overweight   If you have other infected wounds   Weak immune system like in HIV and transplant patients   Long-term use of steroids   Long stay in the hospital   Women using contraceptive pills  You can help to lower your chance of getting a surgical site infection. Here are some things you can do.  Before Your Surgery    Tell your doctor if you already have other infections. Even something like a cold or sore throat may raise your risk of getting a surgical site infection.   Wash your hair and take a bath or shower before your  procedure.   Talk to your doctor about all of the infections you have had in the past. Your doctor may order drugs for you before, during, and after your surgery.   If you need to remove hair from the surgical  site, clip the hair with scissors. Do not shave it.   If your doctor gives you antibiotics before the procedure, take them as ordered. Do not miss any doses.  After Your Surgery    Wash your hands before you touch your wound or dressing. If you have to change the dressing, wash your hands before and after. Make sure any person who touches the dressing washes the hands first.   Tell your doctor right away if you see signs of infection or if you feel ill.   Talk to your doctor and learn how to care for your cut site. Ask your doctor about:   When you should change your bandages   When you may take a bath or shower   You need to take the whole course of drugs that your doctor will give to you.   When changing your dressing, do it in a clean room. Store your bandages in a clean bag and cabinet. Throw away the dirty dressing right away and wash your hands.       What will the results be?   Taking good care of your surgical site will help avoid infection and your wound will heal faster.  What lifestyle changes are needed?    Keep good hygiene. Cleanliness is very important for proper wound healing.   Stop or lessen smoking.   Get lots of rest. Sleep when you are feeling tired. Avoid doing tiring activities.  What drugs may be needed?   The doctor may order drugs to:   Help with pain   Prevent or fight an infection  Will physical activity be limited?    Movement may be limited in most used body parts like the hands, elbows, and knees. Limiting the movement may help with faster wound healing.   Do not go swimming or do other activities that may soak your wound. Dirty water can get in the wound and may cause infection.   Avoid stretching and pulling activities that could cause your scar to pull apart.  Call your doctor if this happens.   Your doctor may ask you to avoid lifting, straining, exercise, or sports for the first month after surgery. Talk with your doctor about the right amount of activity for you.  Will
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
## 2014-04-13 NOTE — Progress Notes (Signed)
Pre-op and Post-op Patient Education    RE: Cindy Ochoa  DOB: [birth year]  MRN: [id number]     Date of Surgery: 05/01/14    Diagnosis: No diagnosis found.    Procedure: Closure of tracheal fistula    Pre-op appt date: 04/13/14    Provider: Dr. Louis Meckel    written pre and postoperative instructions given  verbal pre and post operative instructions given  no barriers to learning identified  demonstrates good understanding  postoperative appointment for 1 weeks        Frederik Pear, RN

## 2014-04-16 ENCOUNTER — Telehealth: Payer: Self-pay | Admitting: Physical Medicine and Rehabilitation

## 2014-04-16 DIAGNOSIS — R252 Cramp and spasm: Secondary | ICD-10-CM

## 2014-04-16 MED ORDER — DANTROLENE SODIUM 25 MG PO CAPS *I*
25.0000 mg | ORAL_CAPSULE | Freq: Every day | ORAL | Status: DC
Start: 2014-04-16 — End: 2014-06-28

## 2014-04-16 NOTE — Telephone Encounter (Signed)
Ms. Lemler is calling to speak with Dr. Nash Mantis. She states this is regarding the medication for muscle spasms that her and Dr. Nash Mantis spoke about. She is looking to get an rx for this, but does not recall the name of this medication.. She is requesting a call back at [phone].

## 2014-04-16 NOTE — Telephone Encounter (Signed)
Patient has decided she wants to begin on Dantrolene. Risks reviewed. She will be getting blood work in about 2 weeks for pre-op testing, will add in electronic request for liver enzymes at that time. Will then need to recheck LFT's again in about 1 month.   Dantrolene script sent to her pharmacy.   Steele Sizer, MD 04/16/2014 1:33 PM

## 2014-04-23 ENCOUNTER — Ambulatory Visit: Payer: Self-pay | Admitting: Rehabilitative and Restorative Service Providers"

## 2014-04-23 DIAGNOSIS — S14105A Unspecified injury at C5 level of cervical spinal cord, initial encounter: Secondary | ICD-10-CM
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
## 2014-04-24 ENCOUNTER — Ambulatory Visit: Payer: Self-pay | Admitting: Rehabilitative and Restorative Service Providers"

## 2014-04-24 ENCOUNTER — Ambulatory Visit
Admit: 2014-04-24 | Discharge: 2014-04-24 | Disposition: A | Discharge status: Home / Self Care | Payer: Self-pay | Source: Ambulatory Visit | Attending: Otolaryngology | Admitting: Otolaryngology

## 2014-04-24 DIAGNOSIS — J9504 Tracheo-esophageal fistula following tracheostomy: Secondary | ICD-10-CM

## 2014-04-24 DIAGNOSIS — R252 Cramp and spasm: Secondary | ICD-10-CM

## 2014-04-24 DIAGNOSIS — N319 Neuromuscular dysfunction of bladder, unspecified: Secondary | ICD-10-CM

## 2014-04-24 LAB — CBC AND DIFFERENTIAL
Baso # K/uL: 0 10*3/uL (ref 0.0–0.1)
Basophil %: 0.2 % (ref 0.1–1.2)
Eos # K/uL: 0 10*3/uL (ref 0.0–0.4)
Eosinophil %: 0.7 % (ref 0.7–5.8)
Hematocrit: 45 % (ref 34–45)
Hemoglobin: 14.7 g/dL (ref 11.2–15.7)
Lymph # K/uL: 2.4 10*3/uL (ref 1.2–3.7)
Lymphocyte %: 40.9 % (ref 19.3–51.7)
MCH: 32 pg/cell (ref 26–32)
MCHC: 33 g/dL (ref 32–36)
MCV: 97 fL — ABNORMAL HIGH (ref 79–95)
Mono # K/uL: 0.4 10*3/uL (ref 0.2–0.9)
Monocyte %: 6.4 % (ref 4.7–12.5)
Neut # K/uL: 3 10*3/uL (ref 1.6–6.1)
Platelets: 80 10*3/uL — ABNORMAL LOW (ref 160–370)
RBC: 4.6 MIL/uL (ref 3.9–5.2)
RDW: 15.4 % — ABNORMAL HIGH (ref 11.7–14.4)
Seg Neut %: 51.8 % (ref 34.0–71.1)
WBC: 5.8 10*3/uL (ref 4.0–10.0)

## 2014-04-24 LAB — HEPATIC FUNCTION PANEL
ALT: 21 U/L (ref 0–35)
AST: 14 U/L (ref 0–35)
Albumin: 4.4 g/dL (ref 3.5–5.2)
Alk Phos: 54 U/L (ref 35–105)
Bilirubin,Direct: <0.2 mg/dL (ref 0.0–0.3)
Bilirubin,Total: 0.4 mg/dL (ref 0.0–1.2)
Total Protein: 7.4 g/dL (ref 6.3–7.7)

## 2014-04-24 LAB — BASIC METABOLIC PANEL
Anion Gap: 13 (ref 7–16)
CO2: 21 mmol/L (ref 20–28)
Calcium: 9.1 mg/dL (ref 8.8–10.2)
Chloride: 108 mmol/L (ref 96–108)
Creatinine: 0.45 mg/dL — ABNORMAL LOW (ref 0.51–0.95)
GFR,Black: 157 *
GFR,Caucasian: 136 *
Glucose: 86 mg/dL (ref 60–99)
Lab: 12 mg/dL (ref 6–20)
Potassium: 4.1 mmol/L (ref 3.3–5.1)
Sodium: 142 mmol/L (ref 133–145)

## 2014-04-24 LAB — PROTIME-INR
INR: 1.1 (ref 1.0–1.2)
Protime: 11.8 s (ref 9.2–12.3)

## 2014-04-24 LAB — MULTIPLE ORDERING DOCS

## 2014-04-24 LAB — APTT: aPTT: 37.6 s (ref 25.8–37.9)

## 2014-04-26 ENCOUNTER — Ambulatory Visit: Payer: Self-pay

## 2014-04-26 DIAGNOSIS — G8254 Quadriplegia, C5-C7 incomplete: Secondary | ICD-10-CM

## 2014-04-26 NOTE — Progress Notes (Signed)
Patient entered our office today for fitting/delivery of her right custom DRAFO DRG FLEX - Laced/Zig-Zag orthosis. She greeted me and was very pleasant when she entered our office. I applied the brace, and the fit was very good. I didn't observe any areas of excessive pressure or irritation. She doesn't have feeling in her legs, so I instructed her to make sure that someone removes the brace and checks her skin regularly. I instructed her to only wear the brace for 1 hour today and have her skin checked immediately after she takes the brace off. Any redness should be mild and disappear within 15 to 20 minutes. She can increase wearing time by an hour each day as long as she isn't showing any signs of excessive irritation or pressure, and she needs to continue regular skin checks. She was instructed to contact our office immediately if she has any issues or concerns. F/u prn.

## 2014-05-01 ENCOUNTER — Ambulatory Visit: Admit: 2014-05-01 | Payer: Self-pay | Source: Ambulatory Visit | Admitting: Otolaryngology

## 2014-05-01 ENCOUNTER — Encounter: Payer: Self-pay | Source: Ambulatory Visit

## 2014-05-01 SURGERY — CLOSURE, FISTULA, TRACHEOCUTANEOUS
Anesthesia: General | Site: Neck

## 2014-05-03 ENCOUNTER — Other Ambulatory Visit: Payer: Self-pay | Admitting: Internal Medicine

## 2014-05-03 MED ORDER — DISPOSABLE GLOVES MISC *A*
Status: DC
Start: 2014-05-03 — End: 2014-05-10

## 2014-05-03 NOTE — Telephone Encounter (Signed)
Cindy Ochoa calling to request prescription(s) Disposable pads, Disposable bags and disposable gloves to be sent to [facility]       Is patient out of the medication? yes  Does the patient have questions regarding the medication for the nurse? no    Patient can be reached at [phone]

## 2014-05-04 ENCOUNTER — Ambulatory Visit: Payer: Self-pay | Admitting: Physical Medicine and Rehabilitation

## 2014-05-04 ENCOUNTER — Telehealth: Payer: Self-pay | Admitting: Physical Medicine and Rehabilitation

## 2014-05-04 ENCOUNTER — Telehealth: Payer: Self-pay | Admitting: Internal Medicine

## 2014-05-04 NOTE — Telephone Encounter (Signed)
Patient calling to cancel her appointment for today @ 430 with Dr. Nash Mantis. Cancelled in [place] and rescheduled for 5/29.

## 2014-05-04 NOTE — Telephone Encounter (Signed)
Called and left vm message stating that Dr. Hassell Done is not available today but that I would get her the message and have her call early next week.

## 2014-05-04 NOTE — Telephone Encounter (Signed)
Cindy Ochoa the patients attorney, needs to speak with Dr Hassell Done and can be reached at [phone].

## 2014-05-07 ENCOUNTER — Other Ambulatory Visit: Payer: Self-pay | Admitting: Internal Medicine

## 2014-05-07 ENCOUNTER — Ambulatory Visit: Payer: Self-pay | Admitting: Otolaryngology

## 2014-05-07 DIAGNOSIS — G8250 Quadriplegia, unspecified: Secondary | ICD-10-CM

## 2014-05-07 MED ORDER — HOLLISTER REPLACEMENT FILTERS MISC
Status: DC
Start: 2014-05-07 — End: 2014-05-10

## 2014-05-07 MED ORDER — INCONTINENCE SUPPLY DISPOSABLE MISC *A*
Status: DC
Start: 2014-05-07 — End: 2014-05-10
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
## 2014-05-07 NOTE — Telephone Encounter (Signed)
(  Please see 5/7 refill)    [name], mother, calling to advise that Cindy Ochoa did not receive refill request. She states patient needs her large bags, her ostomy supplies, incontinence supplies. She is requesting these to be sent to [facility] asap. She can be reached at [phone] with any questions.

## 2014-05-09 ENCOUNTER — Encounter: Payer: Self-pay | Admitting: Gastroenterology

## 2014-05-10 ENCOUNTER — Telehealth: Payer: Self-pay | Admitting: Internal Medicine

## 2014-05-10 DIAGNOSIS — G8250 Quadriplegia, unspecified: Secondary | ICD-10-CM

## 2014-05-10 DIAGNOSIS — G8254 Quadriplegia, C5-C7 incomplete: Secondary | ICD-10-CM

## 2014-05-10 DIAGNOSIS — N319 Neuromuscular dysfunction of bladder, unspecified: Secondary | ICD-10-CM

## 2014-05-10 MED ORDER — HOLLISTER REPLACEMENT FILTERS MISC
Status: DC
Start: 2014-05-10 — End: 2017-11-05

## 2014-05-10 MED ORDER — DISPOSABLE GLOVES MISC *A*
Status: DC
Start: 2014-05-10 — End: 2014-10-24

## 2014-05-10 MED ORDER — INCONTINENCE SUPPLY DISPOSABLE MISC *A*
Status: DC
Start: 2014-05-10 — End: 2014-10-08

## 2014-05-10 NOTE — Telephone Encounter (Signed)
Dr. Recardo Evangelist - Could you please print scripts for all appropriate supplies mentioned below while in clinic this afternoon?  All scripts will need to have diagnosis code included.    I can then fax to [facility] this afternoon and will call patient's mother & [name]/Disability Rights with update.    Please advise - Thanks

## 2014-05-10 NOTE — Telephone Encounter (Signed)
Scripts printed by Dr. Recardo Evangelist & faxed/mailed to [facility] 05/10/2014 6:02 PM     Patient provided an update & she will contact [place] tomorrow to obtain supplies.

## 2014-05-10 NOTE — Telephone Encounter (Signed)
Mom states refill request was made some time ago for incontinence supplies and there has been no response, please call [phone] to clarify

## 2014-05-10 NOTE — Telephone Encounter (Signed)
Dr. Hassell Done -     Saw note in previous 5/8 telephone encounter that you are in contact with attorney.  Please see below. We also just rec'd fax regarding call from attorney Roni Bread which indicates he will be in town next week and would like to meet regarding patient's case.  He can be reached at [phone].

## 2014-05-10 NOTE — Telephone Encounter (Signed)
Per 5/11 refill encounter, patient's mother requested incontinence supplies /ostomy supplies, which were approved & sent to [facility] by Dr. Recardo Evangelist.    Writer will follow up with [place] to determine whether there was an issue with scripts. If so, will discuss with Dr. Recardo Evangelist in clinic this afternoon.    Will call mother back once we have been in contact with pharmacy.

## 2014-05-10 NOTE — Telephone Encounter (Signed)
[employer] calling to request prescription(s) Ostomy Supplies ([place]) [place] to be sent to [place].    Is patient out of the medication? yes  Does the patient have questions regarding the medication for the nurse? yes Please process urgently. Patient is all out.

## 2014-05-10 NOTE — Telephone Encounter (Signed)
[name]/Center from Disability right/ has [place] (three-way-call) stated they need a diagnosis code on script for leg bag and night bags. Cindy Ochoa can be reached @ [phone].  During three-way, pharmacist stated they may not be able to supply this script and suggested patient try [facility] surgical supply.  [name] is asking to be called back ASAP to confirm Cindy Ochoa will receive this request today if possible.   [name] is asking to be called back @ [phone]    *only have enough for today. Stated this has been being worked on since last Friday (05/04/14)

## 2014-05-10 NOTE — Telephone Encounter (Signed)
Cindy Ochoa, Patient's Attorney calling to request to speak with the patient's social worker through the office.    Please call [phone] to discuss.

## 2014-05-11 ENCOUNTER — Telehealth: Payer: Self-pay | Admitting: Internal Medicine

## 2014-05-11 ENCOUNTER — Other Ambulatory Visit: Payer: Self-pay | Admitting: Internal Medicine

## 2014-05-11 MED ORDER — DISPOSABLE UNDERPADS 30"X36" MISC *A*
Status: DC
Start: 2014-05-11 — End: 2014-10-08

## 2014-05-11 MED ORDER — NON-SYSTEM MEDICATION *A*
Status: DC
Start: 2014-05-11 — End: 2014-05-24

## 2014-05-11 NOTE — Telephone Encounter (Signed)
Spoke to [name] to determine whether they located scripts faxed yesterday.    They did not get the scripts for ostomy supplies & incontinence supplies.    Roselyn Reef advised that our office refax scripts to [phone].  Scripts refaxed to [facility] 05/11/2014 11:59 AM and fax confirmation received.    [name] (mother) has been made aware. She is also asking for underpads and bed drainage bags.  Will ask Friday grouper to print so we may fax to [facility] ASAP as patient needs to pick up today.

## 2014-05-11 NOTE — Telephone Encounter (Signed)
New scripts for drainage bags and underpads faxed to [facility] 05/11/2014 12:59 PM

## 2014-05-11 NOTE — Telephone Encounter (Signed)
I will be meeting with Mr. Otelia Limes next Tuesday after 4 PM.  He is calling to meet with [facility] as well.    [name], MD

## 2014-05-11 NOTE — Telephone Encounter (Signed)
(  Please see 5/14 encounter)     [name], mother, calling to advise Cindy Ochoa still has not received the script for patients under pads for her bed and ostomy supplies. She is requesting this to be sent over asap. Please call her back at [phone].

## 2014-05-11 NOTE — Telephone Encounter (Signed)
Patient calling requesting a call back from social worker in regards to all her medical supply scripts. Please call her back at [phone].

## 2014-05-16 NOTE — Telephone Encounter (Signed)
Mom calling to speak with Cindy Ochoa regarding issue with stoma bags, [phone].

## 2014-05-16 NOTE — Telephone Encounter (Signed)
Patient mother Cindy Ochoa is requesting social work to look into getting her daughter another medical supply store. She stated [name] sent only 2 bed bags and her daughter requires more than this due to having a Stoma. She would like this issue fixed as soon as possible due to this making it difficult to care for [name] in a healthy and timely manner. She can be reached at [phone]

## 2014-05-17 NOTE — Telephone Encounter (Signed)
Renee to contact Mother - Medicaid only covers 2 bags a month.  Additional bags are $10 and would have to be paid out of pocket.

## 2014-05-17 NOTE — Telephone Encounter (Addendum)
Writer did not respond to call from Geneticist, molecular has nothing to add to case nor was release or supina on file.  Dr. Hassell Done aware of reasons why writer not returning call   Dr. Hassell Done to meet with lawyer

## 2014-05-17 NOTE — Telephone Encounter (Signed)
Writer spoke directly with patient who indicates that her sole source of insurance is [employer].  Per [employer] it is functional and shows that she has a Water quality scientist  Per patient she had been getting her supplies from [facility] but was told [facility] no longer supply the supplies that she needs. She reports that [facility] use to deliver her 10 bags per month. She changes the bag every three days. She says she can try to was them and reuse them but that they turn green in color when she does that.   Patient notes that her supplier was switched to [name] and that [name] only provided her 2 bags.  Per review of chart and discussions with clinic staff [name] says that this is all that is allowed by patient's insurance.      REQUEST and thoughts:     Team secretary please call [facility] to confirm the number of bags that they were providing and if in fact it was 10 ask them how they got it to go through [place].       If [facility] was some how getting 10 bags please share with [name] how they were able to do this.        PLAN:        Patient is aware that we are going into a long holiday weekend and understands that this is not something that we are likely going to get an answer to before the end of business day Friday.

## 2014-05-18 NOTE — Telephone Encounter (Signed)
[facility] (previous vendor).   They confirmed that they were dispensing 10 urostomy drainage bags per month.    He asked whether patient has Medicare/Medicaid - Advised that she only has Medicaid.  He reports that Medicare will only cover 2/month but Medicaid will cover 10/month.    Left VM for [name] to call back to discuss further.  Will advise her that patient only has Medicaid and request that prescription be resubmitted to insurance so patient may obtain higher quantity.

## 2014-05-22 ENCOUNTER — Telehealth: Payer: Self-pay | Admitting: Internal Medicine

## 2014-05-22 NOTE — Telephone Encounter (Signed)
Lawyer calling to speak with Dr Hassell Done. [employer] can be reached at [phone].

## 2014-05-22 NOTE — Telephone Encounter (Signed)
I tried to contact the attorney to tell him you are not in the office today but could not get through since the hospital's caller ID is blocked.  Our medical records department did send records (look in media scan date 5/19) for a subpoena of the hospital records.

## 2014-05-23 NOTE — Telephone Encounter (Signed)
Attempted to reach Mr. Cindy Ochoa x 2 via my cell phone since his phone will not receive calls made from the hospital phones.  See above.    [name], MD

## 2014-05-24 ENCOUNTER — Other Ambulatory Visit: Payer: Self-pay | Admitting: Internal Medicine

## 2014-05-24 DIAGNOSIS — N319 Neuromuscular dysfunction of bladder, unspecified: Secondary | ICD-10-CM

## 2014-05-24 DIAGNOSIS — G8254 Quadriplegia, C5-C7 incomplete: Secondary | ICD-10-CM

## 2014-05-24 MED ORDER — NON-SYSTEM MEDICATION *A*
Status: DC
Start: 2014-05-24 — End: 2014-10-08

## 2014-05-24 NOTE — Telephone Encounter (Signed)
[facility] requesting another script for the urostomy bags (due to partial fill of last script).

## 2014-05-25 ENCOUNTER — Encounter: Payer: Self-pay | Admitting: Physical Medicine and Rehabilitation

## 2014-05-25 ENCOUNTER — Ambulatory Visit: Payer: Self-pay | Admitting: Physical Medicine and Rehabilitation

## 2014-05-25 VITALS — BP 101/70 | HR 80 | Ht 64.0 in | Wt 152.0 lb

## 2014-05-25 DIAGNOSIS — R252 Cramp and spasm: Secondary | ICD-10-CM

## 2014-05-25 DIAGNOSIS — K592 Neurogenic bowel, not elsewhere classified: Secondary | ICD-10-CM

## 2014-05-25 DIAGNOSIS — S14105A Unspecified injury at C5 level of cervical spinal cord, initial encounter: Secondary | ICD-10-CM

## 2014-05-25 DIAGNOSIS — N319 Neuromuscular dysfunction of bladder, unspecified: Secondary | ICD-10-CM

## 2014-05-25 DIAGNOSIS — S14105D Unspecified injury at C5 level of cervical spinal cord, subsequent encounter: Secondary | ICD-10-CM

## 2014-05-25 DIAGNOSIS — IMO0002 Reserved for concepts with insufficient information to code with codable children: Secondary | ICD-10-CM

## 2014-05-25 MED ORDER — GENERIC DME *A*
Status: DC
Start: 2014-05-25 — End: 2015-07-05
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
## 2014-05-25 NOTE — Patient Instructions (Addendum)
Increase Dantrolene to 25 mg twice per day.  After 10 days, increase to 50 mg (2 pills) twice per day.      Given script for PT.    Given script for prafo boot.      Given script for finger splints.      Get a DEXA scan in next few months.      Get blood work in the end of June.

## 2014-05-31 ENCOUNTER — Telehealth: Payer: Self-pay | Admitting: Physical Medicine and Rehabilitation

## 2014-05-31 DIAGNOSIS — R252 Cramp and spasm: Secondary | ICD-10-CM

## 2014-05-31 DIAGNOSIS — S14105D Unspecified injury at C5 level of cervical spinal cord, subsequent encounter: Secondary | ICD-10-CM

## 2014-05-31 NOTE — Telephone Encounter (Signed)
Patient is requesting a new prescription for occupational therapy as the previous one expired.

## 2014-06-04 ENCOUNTER — Telehealth: Payer: Self-pay | Admitting: Internal Medicine

## 2014-06-04 NOTE — Telephone Encounter (Signed)
Attorney [name] would just like to let Dr. Hassell Done know the patient's case has been settled and to call lawyer for recap. [phone]

## 2014-06-05 NOTE — Telephone Encounter (Signed)
Spoke with Mr. Cindy Ochoa.  Case has been settled -- not for as much as they wanted, but enough to insure she will have enough money for what she wants and needs for the rest of her life.  They are working on getting her a special needs house in [place] where her son would have better schooling.  She will also get a special needs car/driver.  All of the rest of the money will go into a [place] which will be managed by an institution (bank probably).  Details are being worked out now.    Part of the settlement insures Cattleya will continue to have [employer].    Next time she is in clinic we should take stock in what she needs and order it for her whether it be covered by insurance or not.  There should no longer be any financial barriers to her care.    [name], MD

## 2014-06-06 ENCOUNTER — Ambulatory Visit: Payer: Self-pay

## 2014-06-11 ENCOUNTER — Ambulatory Visit: Payer: Self-pay

## 2014-06-18 ENCOUNTER — Telehealth: Payer: Self-pay | Admitting: Physical Medicine and Rehabilitation

## 2014-06-18 NOTE — Telephone Encounter (Signed)
Patient is requesting a callback from Dr. Nash Mantis. Patient states that as per Dr Nash Mantis she was told that she needed to have some sort of test done for her bones, this was on 05/25/14 and she still has not heard anything. Please call her back [phone]

## 2014-06-21 ENCOUNTER — Encounter: Payer: Self-pay | Admitting: Internal Medicine

## 2014-06-21 ENCOUNTER — Ambulatory Visit: Payer: Self-pay | Admitting: Internal Medicine

## 2014-06-21 ENCOUNTER — Encounter: Payer: Self-pay | Admitting: Gastroenterology

## 2014-06-21 VITALS — BP 104/62 | HR 84 | Temp 95.2°F | Ht 64.02 in | Wt 152.0 lb

## 2014-06-21 DIAGNOSIS — F3289 Other specified depressive episodes: Secondary | ICD-10-CM

## 2014-06-21 DIAGNOSIS — N319 Neuromuscular dysfunction of bladder, unspecified: Secondary | ICD-10-CM

## 2014-06-21 DIAGNOSIS — L8990 Pressure ulcer of unspecified site, unspecified stage: Secondary | ICD-10-CM

## 2014-06-21 DIAGNOSIS — G8254 Quadriplegia, C5-C7 incomplete: Secondary | ICD-10-CM

## 2014-06-21 DIAGNOSIS — Z9889 Other specified postprocedural states: Secondary | ICD-10-CM

## 2014-06-22 ENCOUNTER — Ambulatory Visit: Payer: Self-pay | Admitting: Physical Medicine and Rehabilitation
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
## 2014-06-25 ENCOUNTER — Ambulatory Visit: Payer: Self-pay | Admitting: Rehabilitative and Restorative Service Providers"

## 2014-06-25 DIAGNOSIS — S14105A Unspecified injury at C5 level of cervical spinal cord, initial encounter: Secondary | ICD-10-CM

## 2014-06-25 NOTE — Progress Notes (Signed)
06/25/14 1140   Charges   Visit Charges Outpatient Ot Eval - code 0702 (30 minutes);MODIFIER 59

## 2014-06-25 NOTE — Progress Notes (Signed)
Physical Therapy Daily Flowsheet:  *Please see Physical Therapy Exercise Flowsheet for details regarding exercises completed this session.*     06/25/14 1100   Overview   Diagnosis SCI   Insurance Medicaid   Script Date 05/25/14   Visit # 1   Additional Comments pt is accompanied by her aide.   Pain Assessment   0-10 Scale 6   Pain Location Hand   Pain Orientation Right;Left   Pain Descriptors Sore   ROM   R Hip Flexion (0-125) 0-95   R Hip Abduction (0-45) 0-25   R Knee Flexion (0-140) 0-95   R Knee Extension (0) 0   R Ankle Dorsiflexion (0-20) -10 with prolonged stretch   L Hip Flexion (0-125) 0-95   L Hip Abduction (0-45) 0-30   L Knee Flexion (0-140) 0-100   L Knee Extension (0) 0   L Ankle Dorsiflexion (0-20) to neutral with prolonged stretch   Functional Activities   Additional Comments pt is currently a hoyer transfer at home.  pt has aides 7 days/week  from 7-3p and 3-11p.  She is completely dependent with all ADL's except for feeding.  pt is independent in power wheelchair and is able to manage other power functions for pressure relief.  pt is currently sitting on roho cushion.  pt is currently experiencing severe spasms of both legs and significant increase in spasticity.  pt reports that she has begun a new medication for spasm management, but has not taken it in over a week due to a mix up with her mother.  pt also reports that she has not had an appropriate stretching program for LE in years.  Pt had an aide with her today that was instructed in appropriate ROM exercises.  Aide demonstrated all exercises appropriately.  pt was given sheet with instructions and pictures of stretches for home.  Instructed with the importance of pt and aide trained to be able to train other aides with stretching program.     Patient Education   Educated in disease process Yes   Educated in home exercise program Yes   Additional Patient Education stretching program 2x/day and reviewed pressure reliefs while in wheelchair  and in bed.   Time Calculation   PT Untimed Codes 58   PT Unbilled Time 0   PT Total Treatment 58   Charges   Visit Charges Outpatient Pt Eval - code 6294 (60 minutes)   Jerrye Bushy, PT
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
## 2014-06-26 ENCOUNTER — Ambulatory Visit: Payer: Self-pay | Admitting: Wound

## 2014-06-27 ENCOUNTER — Ambulatory Visit: Payer: Self-pay | Admitting: Bariatric Surgery

## 2014-06-28 ENCOUNTER — Other Ambulatory Visit: Payer: Self-pay | Admitting: Internal Medicine

## 2014-06-28 ENCOUNTER — Ambulatory Visit: Payer: Self-pay

## 2014-06-28 ENCOUNTER — Telehealth: Payer: Self-pay | Admitting: Physical Medicine and Rehabilitation

## 2014-06-28 DIAGNOSIS — IMO0002 Reserved for concepts with insufficient information to code with codable children: Secondary | ICD-10-CM

## 2014-06-28 DIAGNOSIS — R252 Cramp and spasm: Secondary | ICD-10-CM

## 2014-06-28 MED ORDER — IBUPROFEN 800 MG PO TABS *I*
800.0000 mg | ORAL_TABLET | Freq: Three times a day (TID) | ORAL | Status: DC | PRN
Start: 2014-06-28 — End: 2014-12-12

## 2014-06-28 MED ORDER — DANTROLENE SODIUM 50 MG PO CAPS *I*
ORAL_CAPSULE | ORAL | Status: DC
Start: 2014-06-28 — End: 2015-03-16

## 2014-06-28 MED ORDER — BISACODYL 10 MG RE SUPP *I*
10.0000 mg | Freq: Every day | RECTAL | Status: DC | PRN
Start: 2014-06-28 — End: 2014-09-06

## 2014-06-28 NOTE — Telephone Encounter (Signed)
Cindy Ochoa calling to request prescription(s)ibuprofen (ADVIL,MOTRIN) 800 MG tablet & BISAC-EVAC 10 MG suppository  to be sent to [facility].    Is patient out of the medication? yes  Does the patient have questions regarding the medication for the nurse? no

## 2014-06-28 NOTE — Telephone Encounter (Signed)
Cindy Ochoa has called to speak with Dr. Nash Mantis regarding her muscle spasm medication. She does not know the name of it, but states it is not working and her spasms have gotten worse. Please contact [facility] at [phone] to discuss.

## 2014-06-28 NOTE — Telephone Encounter (Signed)
Patient ran out of her dantrolene a couple of days ago. Her spasms are worse and she is getting pulled out of her chair a few times a day now. Even when she was on the dantrolene she still didn't get enough control of her spasms. She does have a new open spot on her bottom. Her aides turn her every 2 hours when she is in bed. She has a harder time repositioning when she is in the chair but aides also help with that and she reports a good cushion. She has an appointment with the [facility] already on 7/11.   Will increase dantrolene to 50 mg TID and if insufficient control after 7 days patient can increase to 100 mg TID.   She did not get LFT's done last week.   Will get last week in July instead after she reaches steady state with the med.   Steele Sizer, MD 06/28/2014 3:53 PM

## 2014-06-29 ENCOUNTER — Other Ambulatory Visit: Payer: Self-pay | Admitting: Physical Medicine and Rehabilitation

## 2014-07-03 NOTE — Procedures (Signed)
DXA scan for your review.

## 2014-07-04 ENCOUNTER — Encounter: Payer: Self-pay | Admitting: Internal Medicine

## 2014-07-04 ENCOUNTER — Other Ambulatory Visit: Payer: Self-pay | Admitting: Internal Medicine

## 2014-07-04 DIAGNOSIS — M818 Other osteoporosis without current pathological fracture: Secondary | ICD-10-CM | POA: Insufficient documentation

## 2014-07-04 DIAGNOSIS — M810 Age-related osteoporosis without current pathological fracture: Secondary | ICD-10-CM

## 2014-07-04 HISTORY — DX: Age-related osteoporosis without current pathological fracture: M81.0

## 2014-07-04 MED ORDER — VITAMIN D 1000 UNIT PO TABS *A*
1000.0000 [IU] | ORAL_TABLET | Freq: Every day | ORAL | Status: AC
Start: 2014-07-04 — End: 2014-12-31

## 2014-07-04 NOTE — Progress Notes (Signed)
Exchanged messages with Dr. Nash Mantis concerning patient's DEXA scan - shows osteoporosis.  Patient will need vitamin D ( will order) and daily calcium - prefer three times daily dietary calcium to prevent constipation.    Will order [facility] referral.    [name], MD

## 2014-07-06 ENCOUNTER — Encounter: Payer: Self-pay | Admitting: Bariatric Surgery

## 2014-07-06 ENCOUNTER — Ambulatory Visit: Payer: Self-pay | Admitting: Bariatric Surgery

## 2014-07-06 VITALS — BP 95/63 | HR 103 | Temp 95.2°F | Resp 16 | Ht 64.0 in | Wt 125.0 lb

## 2014-07-06 DIAGNOSIS — L8993 Pressure ulcer of unspecified site, stage 3: Secondary | ICD-10-CM

## 2014-07-06 NOTE — Progress Notes (Signed)
[facility]  Progress Note      Name: Cindy Ochoa, Cindy Ochoa  MRN: [id number]  DOB: 19-Apr-1986      Date of Encounter: 07/06/2014      Medical Providers    Referring: [name], MD   PCP: [name], MD       Chief Complaint      Chief Complaint   Patient presents with    Initial Evaluation     right ischial          History of Present Ilness   Ms. Wohlfarth is a 28 y.o. female. She returns for a  Second opinion regarding a Right ischial pressure ulcer.    She has a prior history of similar pressure ulcers that have been healed in the past with better moisture control and off-loading.    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      Allergies   She is allergic to nitrofurantoin; vancomycin; and heparin.      Medications  Current Outpatient Prescriptions   Medication Sig    cholecalciferol (VITAMIN D) 1000 UNIT tablet Take 1 tablet (1,000 Units total) by mouth daily    ibuprofen (ADVIL,MOTRIN) 800 MG tablet Take 1 tablet (800 mg total) by mouth 3 times daily as needed    FOR  PAIN .    bisacodyl (BISAC-EVAC) 10 MG suppository Place 1 suppository (10 mg total) rectally daily as needed   FOR CONSTIPATION    dantrolene (DANTRIUM) 50 MG capsule 50 mg po TID for 7 days then increased to 100 mg po TID.    generic DME Use as directed.    Non-System Medication Urostomy drainage bags. Change as needed. 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 OINT Apply 1 Tube topically 2 times daily      Non-System Medication  One [name] Lift  Diagnosis:  Quadriplegic ICD-9 344.04    Wound Dressings (ALLEVYN AG SACRUM) PADS Apply 1 Units topically every other day     No current facility-administered medications for this visit.         Review of Systems   Unchanged from prior visit.    Past Surgical History   She has past surgical history that includes Multiple injuries (04/29/2005 ); craniotomy (04/29/2005); Cervical spine surgery (04/29/2005); Cervical spine surgery (05/04/2005); Tracheostomy tube placement (05/15/05); Gastrostomy tube placement (05/15/05); IVC filter (May 2006 ); Urethral plication (54/62/7035); Left Tibia fracture  (06/01/07); PICC insertion > 5 years (08/27/2012); and ileal loop urinary diversion (08/26/2012 ).      Social History   She reports that she quit smoking about 2 years ago. Her smoking use included Cigarettes. She started smoking about 2 years ago. She has a 1.5 pack-year smoking history. She quit smokeless tobacco use about 9 years ago. She reports that she uses illicit drugs (Marijuana). She reports that she does not drink alcohol.      Family History   Her family history includes Breast cancer in an other family member; Cancer in an other family member; Diabetes in her maternal grandmother and mother; High cholesterol in her mother; Hypertension in an other family member; Osteoarthritis in her maternal grandmother; Stroke in her maternal grandfather. There is no history of Colon cancer or Thrombosis.      Vital Signs  Filed Vitals:    07/06/14 1004   BP: 95/63   Pulse: 103   Temp: 35.1 C (95.2 F)   Resp: 16   Height: 1.626 m (5\' 4" )   Weight: 56.7 kg (125 lb)         Physical Exam   General appearance: alert, well appearing, and in no distress, oriented to person, place, and time, acyanotic, in no respiratory distress and well hydrated.   Heart: normal rate and regular rhythm.    Lungs: unlabored respirations, no intercostal retractions or accessory muscle use.   Extremities: extremities normal, atraumatic, no cyanosis or edema. pulses not examined.   Neurologic: sensation not intact in feet       Wound #6   Location: Right ischium   Well healed    Wound #7   Location: sacrum   Well healed  Peri-wound skin healthy      Assessment   Ms. Washburn is a 28 y.o. female with well healed Stage 2 pressure ulcer. S/p ileal loop diversion procedure for better urinary drainage.     Plan   Wounds all look well healed. There is areas of hard scarring but that should improve over time with good hygiene and daily application of skin barrier cream.     Local wound care: Continue skin barrier cream and better offloading to protect  from further breakdown and prevent recurrence.     Follow up prn.Marland Kitchen

## 2014-07-06 NOTE — Patient Instructions (Addendum)
Please follow these instructions about how to care for your wound.            Wound Number(s) and/or Location Dressing Orders Frequency of Dressing Change       Location: Right ischium Healed - Continue to use Calmseptine daily [x]  Daily   []  2 Times Per Week  []  3 Times Per Week  []  Every Other Day                            []  Do Not Change Dressing  []  Other:             Cleanse Wound(s) with:   []  Normal Saline   [x]  Soap & Water   []  Keep Dry                []   Assistive Device(s):   [x]   Wheelchair   []   Walker   []   Cane   []   Crutches      []   Wedge Shoe   []   Other:    []   Pressure Reduction:   [x]   Wheelchair Cushion   [x]   Mattress Overlay      []   Specialty Bed   [x]   Reposition every 2 hours      []   Other:     []   Home Health:    []   VNS   []   LTC   []   HCR   []   Other:    []   General:    []   Stop / Decrease Smoking   [x]   Multivitamin   []   Exercise   []   Other:        []   Discharge from [facility] Discharge Instructions    Following the instructions below will give you the best opportunity for wound healing.    Wound Care Instructions (If changing own dressing):   Gather all supplies you will need to change your dressing.   Before changing your dressing, wash your hands for at least 15 seconds with warm soapy water.   Rinse off all soap, then dry with a towel.   Remove the old dressing.  Wash hands again before applying the new dressing.    If you experience any of the following during our business hours of 8 AM - 4:30 PM, Monday - Friday, please call the [facility] at [phone]:    ? Increase in pain  ?Temperature over 101F   ?Drainage with a foul odor   ?Bleeding   ? Increase in swelling  ? Increase in drainage from your wound  ? Need for compression bandage changes (slippage, breakthrough drainage)    Please contact your primary care physician or proceed to the nearest emergency room if you experience any of the above after our business hours.      Please note your appointment(s) above - if you are unable to keep, kindly give 24 hours' notice.  Thank You!

## 2014-07-09 ENCOUNTER — Encounter: Payer: Self-pay | Admitting: Anesthesiology

## 2014-07-09 ENCOUNTER — Telehealth: Payer: Self-pay | Admitting: Urology

## 2014-07-09 ENCOUNTER — Telehealth: Payer: Self-pay | Admitting: Internal Medicine

## 2014-07-09 NOTE — Anesthesia Preprocedure Evaluation (Deleted)
Anesthesia Pre-operative Evaluation for [facility]    ______________________________________________________________________________________  CPM Assessment Not Completed  Anesthesia Evaluation                    GI/HEPATIC/RENAL  Last PO Intake: Enter Last PO Intake in ROS/Med Hx Tab NEURO/PSYCH    + Headaches            migraines    + Psychiatric Issues               Physical Exam Not Completed________________________________________________________________________  Plan    Possible ASA Score 3  Possible Anesthetic Plan (general)  Induction (routine IV); General Anesthesia/Sedation Maintenance Plan (propofol infusion); Airway (nasal cannula); Line ( use current access); Monitoring (standard ASA); Positioning (supine); Pain (per surgical team); PostOp (PACU)    Anesthesia Consent Not Performed

## 2014-07-09 NOTE — Telephone Encounter (Signed)
Spoke with patient and she needs an overnight bag until her supplier can give her one.  Spoke with [facility] office and they will have one set at the desk for her to pick up.

## 2014-07-09 NOTE — Telephone Encounter (Signed)
Cindy Ochoa is calling in to request a nurse call her back to discuss her nephroscopy bag. Patient states she needs a company that can supply a bag for her because the current company [employer] is not sending her the supplies that she needs. Cindy Ochoa can be reached at [phone].

## 2014-07-09 NOTE — Telephone Encounter (Signed)
Patient is calling to speak with Social Work in regards to finding a different medical supply company. She can be reached at [phone]

## 2014-07-10 ENCOUNTER — Encounter: Payer: Self-pay | Source: Ambulatory Visit | Attending: Otolaryngology

## 2014-07-10 SURGERY — CLOSURE, FISTULA, TRACHEOCUTANEOUS
Anesthesia: General | Site: Neck

## 2014-07-10 NOTE — Telephone Encounter (Signed)
The patient's mother [name] called stating she is having problems getting a bed bag from [employer].    The patient is out of the bed bags, and needs them ASAP.    Patient would like a call back from a social worker to assist her in finding a new supply company.    Please call pt back at [phone]

## 2014-07-10 NOTE — Telephone Encounter (Signed)
Looks like patient has contacted Urology and they are following up on this issue per encounter dated 7/13.

## 2014-07-11 ENCOUNTER — Ambulatory Visit
Admit: 2014-07-11 | Discharge: 2014-07-11 | Payer: Self-pay | Source: Ambulatory Visit | Attending: Otolaryngology | Admitting: Otolaryngology

## 2014-07-11 NOTE — Progress Notes (Signed)
Department of Physical Medicine & Rehabilitation  Physical Therapy Initial Assessment    HISTORY  Diagnosis: SCI  Referring practitioner: Dr. Audie Clear  Onset date on symptoms/Date of Surgery:  9 years  Previous treatments:  unknown  Home Living:  Pt lives alone and has Aide service 7 days/week    SUBJECTIVE  Pain:  0-10 Scale: 6     Pain Location: Hand  Pain Orientation: Right, Left     Pain Descriptors: Sore       OBJECTIVE  Cognition: No deficits noted  Vision: No deficits noted  Sensation: Impaired  Tone:    Right Lower Extremity:   Abnormal tone RLE:   Hypertonic  Left Lower Extremity:   Abnormal tone LLE:   Hypertonic   ROM:   ROM  R Hip Flexion (0-125): 0-95  R Hip Abduction (0-45): 0-25  R Knee Flexion (0-140): 0-95  R Knee Extension (0): 0  R Ankle Dorsiflexion (0-20): -10 with prolonged stretch  L Hip Flexion (0-125): 0-95  L Hip Abduction (0-45): 0-30  L Knee Flexion (0-140): 0-100  L Knee Extension (0): 0  L Ankle Dorsiflexion (0-20): to neutral with prolonged stretch  Strength: 0/5 BLE     Balance:     Static sitting:  Mod assist supported   Dynamic sitting:  Mod assist supported   Functional assessment:   Functional Activities  Additional Comments: pt is currently a hoyer transfer at home.  pt has aides 7 days/week  from 7-3p and 3-11p.  She is completely dependent with all ADL's except for feeding.  pt is independent in power wheelchair and is able to manage other power functions for pressure relief.  pt is currently sitting on roho cushion.  pt is currently experiencing severe spasms of both legs and significant increase in spasticity.  pt reports that she has begun a new medication for spasm management, but has not taken it in over a week due to a mix up with her mother.  pt also reports that she has not had an appropriate stretching program for LE in years.  Pt had an aide with her today that was instructed in appropriate ROM exercises.  Aide demonstrated all exercises appropriately.  pt was  given sheet with instructions and pictures of stretches for home.  Instructed with the importance of pt and aide trained to be able to train other aides with stretching program.        Endurance: fair   Patient Education  Educated in disease process: Yes  Educated in home exercise program: Yes  Additional Patient Education: stretching program 2x/day and reviewed pressure reliefs while in wheelchair and in bed., Additional Patient Education: stretching program 2x/day and reviewed pressure reliefs while in wheelchair and in bed.    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  Pt is currently a hoyer lift transfer and is independent in a power wheelchair.  Pt is independent in pressure reliefs in power chair. Exercise sheet provided  Rehab potential/prognosis: fair  Patient's understanding: good      PLAN  Plan of Care: Appropriate for PT  PT interventions: AROM/PROM/Therapeutic exercise, Home exercise program instruction, Patient/Family Education, Postural training/body Dealer education  PT frequency:  follow up in 3 weeks to be sure HEP is going well  PT duration: 12 weeks    Short term goals: 4 weeks  1. Pt and aides to  be independent in home stretching program.  Long term goals: 12 weeks  1. See above  Patient Goals for Therapy:  Pt wants to be sure aide is performing stretches properly.      Thank you for the referral.  If you have any questions and/or concerns, please feel free to contact me at [phone].  Jerrye Bushy, PT

## 2014-07-11 NOTE — Progress Notes (Signed)
Physical Therapy Exercise Flowsheet:  *Please refer to Physical Therapy Daily Flowsheet for further details of this session.*     06/25/14 1000   Hip Exercises   additional exercise instructed pt and aide in home stretching program and will follow up in 3 weeks   Total time [place], PT

## 2014-07-12 ENCOUNTER — Ambulatory Visit: Payer: Self-pay

## 2014-07-12 ENCOUNTER — Telehealth: Payer: Self-pay | Admitting: Physical Medicine and Rehabilitation

## 2014-07-12 DIAGNOSIS — M6701 Short Achilles tendon (acquired), right ankle: Secondary | ICD-10-CM

## 2014-07-12 DIAGNOSIS — G8254 Quadriplegia, C5-C7 incomplete: Secondary | ICD-10-CM

## 2014-07-12 DIAGNOSIS — IMO0002 Reserved for concepts with insufficient information to code with codable children: Secondary | ICD-10-CM

## 2014-07-12 DIAGNOSIS — S14105S Unspecified injury at C5 level of cervical spinal cord, sequela: Secondary | ICD-10-CM

## 2014-07-12 NOTE — Telephone Encounter (Signed)
Patient stopped by [facility] looking for an Ankle contracture boot and they told her she needed a script from Dr. Nash Mantis.    She was wondering if Dr. Nash Mantis or Dr. Arley Phenix could send over the script or if she could call her at at above #.    Thanks,    Candee Furbish  Physical Medicine and Rehab

## 2014-07-12 NOTE — Progress Notes (Signed)
Patient entered our office today for f/u regarding her AFO. She presents today without the brace, and she stated that it isn't working for her. She stated that it doesn't manage her spacticity and tone well, and the brace hits her other leg and causes contusions. She showed me a picture of a PRAFO, and stated that she would like to try that instead of the AFO.  I stated that she should discuss that approach with her physician and determine if that would be the most appropriate course of action. She stated that she would talk to the physician and f/u prn.

## 2014-07-13 ENCOUNTER — Telehealth: Payer: Self-pay | Admitting: Rehabilitative and Restorative Service Providers"

## 2014-07-13 NOTE — Telephone Encounter (Signed)
Patient called to cancel her upcoming PT/OT appointments for the 27th and 28th.    Patient did not wish to reschedule at the time of the call.

## 2014-07-16 NOTE — Addendum Note (Signed)
Addended by: Steele Sizer on: 07/16/2014 02:23 PM     Modules accepted: Orders

## 2014-07-23 ENCOUNTER — Ambulatory Visit: Payer: Self-pay | Admitting: Rehabilitative and Restorative Service Providers"

## 2014-07-23 ENCOUNTER — Ambulatory Visit: Payer: Self-pay

## 2014-07-24 ENCOUNTER — Ambulatory Visit: Payer: Self-pay | Admitting: Rehabilitative and Restorative Service Providers"

## 2014-07-25 ENCOUNTER — Encounter: Payer: Self-pay | Admitting: Internal Medicine

## 2014-07-27 ENCOUNTER — Ambulatory Visit: Payer: Self-pay | Admitting: Urology

## 2014-08-24 ENCOUNTER — Ambulatory Visit: Payer: Self-pay | Admitting: Physical Medicine and Rehabilitation

## 2014-08-29 ENCOUNTER — Ambulatory Visit: Payer: Self-pay | Admitting: Student in an Organized Health Care Education/Training Program

## 2014-09-06 ENCOUNTER — Other Ambulatory Visit: Payer: Self-pay | Admitting: Internal Medicine

## 2014-09-06 MED ORDER — BISACODYL 10 MG RE SUPP *I*
10.0000 mg | Freq: Every day | RECTAL | Status: DC | PRN
Start: 2014-09-06 — End: 2014-09-25

## 2014-09-06 NOTE — Telephone Encounter (Signed)
Patient is requesting a rush on medication, stated she is all backed up and out of med's.

## 2014-09-24 ENCOUNTER — Ambulatory Visit: Payer: Self-pay | Admitting: Obstetrics and Gynecology

## 2014-09-24 ENCOUNTER — Ambulatory Visit: Payer: Self-pay | Admitting: Urology

## 2014-09-24 ENCOUNTER — Encounter: Payer: Self-pay | Admitting: Obstetrics and Gynecology

## 2014-09-24 VITALS — BP 177/90 | HR 113 | Ht 64.02 in | Wt 125.5 lb

## 2014-09-24 DIAGNOSIS — Z3046 Encounter for surveillance of implantable subdermal contraceptive: Secondary | ICD-10-CM

## 2014-09-24 DIAGNOSIS — Z3009 Encounter for other general counseling and advice on contraception: Secondary | ICD-10-CM

## 2014-09-24 DIAGNOSIS — Z113 Encounter for screening for infections with a predominantly sexual mode of transmission: Secondary | ICD-10-CM

## 2014-09-24 DIAGNOSIS — Z30012 Encounter for prescription of emergency contraception: Secondary | ICD-10-CM

## 2014-09-24 DIAGNOSIS — Z789 Other specified health status: Secondary | ICD-10-CM

## 2014-09-24 MED ORDER — IBUPROFEN 600 MG PO TABS *I*
600.0000 mg | ORAL_TABLET | Freq: Four times a day (QID) | ORAL | Status: DC | PRN
Start: 2014-09-24 — End: 2014-12-12

## 2014-09-24 MED ORDER — LEVONORGESTREL 1.5 MG PO TABS *I*
1.5000 mg | ORAL_TABLET | Freq: Once | ORAL | Status: AC
Start: 2014-09-24 — End: 2014-09-24

## 2014-09-24 MED ORDER — IBUPROFEN 800 MG PO TABS *I*
800.0000 mg | ORAL_TABLET | Freq: Once | ORAL | Status: AC
Start: 2014-09-24 — End: 2014-09-24
  Administered 2014-09-24: 800 mg via ORAL

## 2014-09-24 NOTE — Patient Instructions (Signed)
After Your Contraceptive Implant Removal      What to Expect   Your arm will be achy for about 4 days.   Your arm will have mild swelling and a bruise as it heals.    What to Do   Keep the dressing on for 1 day   Keep the area clean and dry   Do not do any heavy lifting with that arm for 1 day   Take Acetaminophen(Tylenol) or Ibuprofen(Motrin, Advil) for the soreness.    When to CALL  After your contraceptive implant insertion, call us right away if you have any of the problems listed below.  After business hours and on the weekend, our office number will connect you to our answering service which will put you in touch with the doctor on call, so do not hesitate to call.    Call [phone] if you have any of the following symptoms:   Fever   Excessive swelling at the site   Severe pain in your arm   Bright redness at the site

## 2014-09-24 NOTE — Procedures (Signed)
Nexplanon Removal Note    Cindy Ochoa is a 28 y.o. female, G1P1001 with a last period absent with Implanon in place  .    Her current method of birth control is Implanon and prior methods include: condoms and Depo. She states her last intercourse was 2 years ago.Patient presents to the visit with her mother who helps to care for her. She and her mother report her other medical providers as concerned with her use of Implanon. She reports concerns for bone health and that the Implanon could be making her bones weaker. She has also lost weight and feels the Implant could be a factor.  She would like to eliminate anything that may be a problem for her long term healthy. She is not sexually active and has not been since Implant was placed. She does not have a partner and dose not feel this would be a concern. She would use condoms prn and is aware of health risks with pregnancy. Provider reviewed with patient and her mother that Implant profile of side effects and concerns dose not include weight loss or bone thinning. Reviewed that pregnancy would have risk for maternal/fetal health. The patient states she understands what was shared but feels she will not have a sexual partner and thus dose not need contraception.     - Patient also feels she may have a UTI and has an appointment scheduled with her PCP to address this concern. She has an ileostomy. She denies fever.     UPT result: N/A     Consent:  The procedure risks, benefits, complications and possible alternatives were discussed with the patient. Patient understands that she may become pregnant after removal. All questions were answered prior to the patient signing the informed consent.    Pre-Procedural Time Out:  09/24/2014                                         9:13 AM    Correct Procedure: Yes  Correct Patient: (use 2 Identifiers) Yes  Correct Site: Yes  Site marked: Yes  Correct Patient Position: Yes  Consent Verified: Yes  Appropriate Hand Hygiene Used:  Yes  List Any Participants Involved in Time-Out : patient, Marcille Blanco, NP  Availability of correct implants and any special equipment: Yes    Procedure Details     Site identified in Left Arm and confirmed with patient. Site marked with skin marker and prepped with povidone iodine in the usual sterile fashion.  Local anesthesia achieved with injection of 3 mL of 1% lidocaine with epinephrine into distal implant area.  Performed linear incision parallel to the device.  Probed wound for distal end of Implanon device. Device removed intact. Wound edges approximated and held closed with SteriStrip.   Dry sterile pressure dressing placed.  Patient tolerated the procedure well. Blood loss was minimal.        Post procedure pain rated as 2-3  The patient received 800 mg of ibuprofen in the office.     Assessment:   Implanon removal at patient request.   Family planning    Plan:  Post removal instructions were reviewed with the patient and written information was also provided. Monitor for Fever,Excessive swelling at the site,Severe pain in your arm,Bright redness at the site.  The plan was reviewed with the patient including: - Other instructions: Patient plans to f/u with PCP  today regarding her concerns for UTI  - The patient will follow up for AGY. STI screening for HIV, hep B,C and syphilis requested by patient and ordered.   - Post Implanon removal patient will use abstinence for contraception. Condoms samples supplied and Rx for Plan B sent. Patient to monitor for resumption of menses. Call if alternative methods requested.   All questions were answered and the patient stated a good understanding of instructions.

## 2014-09-25 ENCOUNTER — Other Ambulatory Visit: Payer: Self-pay | Admitting: Internal Medicine

## 2014-09-25 NOTE — Telephone Encounter (Signed)
Requested Prescriptions     Pending Prescriptions Disp Refills    BISAC-EVAC 10 MG suppository [Pharmacy Med Name: BISAC-EVAC 10MG  SUP] 12 suppository      Sig: UNWRAP AND PLACE 1 SUPPOSITORY (10 MG TOTAL) RECTALLY DAILY AS NEEDED FOR CONSTIPATION

## 2014-09-27 ENCOUNTER — Ambulatory Visit
Admit: 2014-09-27 | Discharge: 2014-09-27 | Disposition: A | Discharge status: Home / Self Care | Payer: Self-pay | Source: Ambulatory Visit | Attending: Obstetrics and Gynecology | Admitting: Obstetrics and Gynecology

## 2014-09-27 DIAGNOSIS — Z113 Encounter for screening for infections with a predominantly sexual mode of transmission: Secondary | ICD-10-CM

## 2014-09-28 LAB — HEPATITIS B SURFACE ANTIGEN: HBV S Ag: NEGATIVE

## 2014-09-28 LAB — HEPATITIS C ANTIBODY: Hep C Ab: NEGATIVE

## 2014-09-28 LAB — SYPHILIS SCREEN
Syphilis Screen: NEGATIVE
Syphilis Status: NONREACTIVE

## 2014-09-28 LAB — HIV 1&2 ANTIGEN/ANTIBODY: HIV 1&2 ANTIGEN/ANTIBODY: NONREACTIVE

## 2014-10-05 ENCOUNTER — Ambulatory Visit: Payer: Self-pay | Admitting: Urology

## 2014-10-08 ENCOUNTER — Telehealth: Payer: Self-pay | Admitting: Internal Medicine

## 2014-10-08 DIAGNOSIS — N319 Neuromuscular dysfunction of bladder, unspecified: Secondary | ICD-10-CM

## 2014-10-08 DIAGNOSIS — G8250 Quadriplegia, unspecified: Secondary | ICD-10-CM

## 2014-10-08 DIAGNOSIS — G8254 Quadriplegia, C5-C7 incomplete: Secondary | ICD-10-CM

## 2014-10-08 MED ORDER — DISPOSABLE UNDERPADS 30"X36" MISC *A*
Status: DC
Start: 2014-10-08 — End: 2014-10-24

## 2014-10-08 MED ORDER — INCONTINENCE SUPPLY DISPOSABLE MISC *A*
Status: DC
Start: 2014-10-08 — End: 2014-10-24

## 2014-10-08 MED ORDER — NON-SYSTEM MEDICATION *A*
Status: DC
Start: 2014-10-08 — End: 2014-10-24

## 2014-10-08 NOTE — Telephone Encounter (Signed)
Signed orders    Urostomy drainage bags printed will give to secretary to fax

## 2014-10-08 NOTE — Telephone Encounter (Signed)
Kaitlyn from [employer] called to request script for patient for following supplies:    Large briefs, quantity 150    Underpads, quantity 150    2000cc drainage bags, quantity 10      A valid Dx code is needed for all supplies    Please fax to [facility] at fax:  [phone]

## 2014-10-09 NOTE — Telephone Encounter (Signed)
Faxed as requested

## 2014-10-15 ENCOUNTER — Other Ambulatory Visit: Payer: Self-pay | Admitting: Internal Medicine

## 2014-10-24 ENCOUNTER — Telehealth: Payer: Self-pay

## 2014-10-24 ENCOUNTER — Telehealth: Payer: Self-pay | Admitting: Internal Medicine

## 2014-10-24 ENCOUNTER — Ambulatory Visit: Payer: Self-pay | Admitting: Student in an Organized Health Care Education/Training Program

## 2014-10-24 ENCOUNTER — Encounter: Payer: Self-pay | Admitting: Student in an Organized Health Care Education/Training Program

## 2014-10-24 VITALS — BP 82/52 | HR 76 | Temp 96.5°F | Ht 64.02 in | Wt 125.0 lb

## 2014-10-24 DIAGNOSIS — G8250 Quadriplegia, unspecified: Secondary | ICD-10-CM

## 2014-10-24 DIAGNOSIS — N319 Neuromuscular dysfunction of bladder, unspecified: Secondary | ICD-10-CM

## 2014-10-24 DIAGNOSIS — G8254 Quadriplegia, C5-C7 incomplete: Secondary | ICD-10-CM

## 2014-10-24 MED ORDER — BISACODYL 10 MG RE SUPP *I*
10.0000 mg | Freq: Every day | RECTAL | Status: DC | PRN
Start: 2014-10-24 — End: 2014-12-12

## 2014-10-24 MED ORDER — DISPOSABLE GLOVES MISC *A*
Status: DC
Start: 2014-10-24 — End: 2015-04-11

## 2014-10-24 MED ORDER — INCONTINENCE SUPPLY DISPOSABLE MISC *A*
Status: DC
Start: 2014-10-24 — End: 2015-04-11

## 2014-10-24 MED ORDER — DISPOSABLE UNDERPADS 30"X36" MISC *A*
Status: DC
Start: 2014-10-24 — End: 2015-04-11

## 2014-10-24 MED ORDER — NON-SYSTEM MEDICATION *A*
Status: DC
Start: 2014-10-24 — End: 2015-04-11

## 2014-10-24 NOTE — Telephone Encounter (Addendum)
Per 09/15/2014 inpatient SW note:    Contacts/Support Systems    Name  [employer]    Number  [phone]    Relationship  CD PAS case manager      Per pt, she has 129 hours of HHA services per week through [facility], paid for by CDPAS program. Her case manager is Lear Ng.     10/24/2014 Spoke to [facility] [employer].   Edwena Blow is no longer working there.   Was forwarded to [employer].   10/26/2014 Spoke to [place].  [facility] [employer] no longer provides CD PAS for her.   He verified that she should follow up with the county nurse to re-evaluate the number of hours that she is allowed.     [facility] [phone]  Spoke with [facility].   They report that Ms. Lizarraga does not receive services any more from them.   Was transferred to the manager: Chryl Heck.   Mr. Alease Medina explained that Ms. Cake had transferred her aid service to another business.   [employer] provides the financial structure to pay aids. (CD Pass funding to aid, taxes.Marland Kitchen)  If Ms. Taliercio needs to out of pocket/private aids, a service such as [employer] would not need to be involved.

## 2014-10-24 NOTE — Telephone Encounter (Signed)
-----   Message from Kerri Perches, RN sent at 10/24/2014 11:54 AM EDT -----  Regarding: FW: 24 hour monitoring      ----- Message -----     From: Roby Lofts     Sent: 10/24/2014  11:14 AM       To: Med Resident Clnc Nurse June Leap)  Subject: FW: 24 hour monitoring                               ----- Message -----     From: [name], MD     Sent: 10/24/2014  10:52 AM       To: [facility] Team  Subject: 24 hour monitoring                               Hey all,    Jency is moving into a new house and is requesting that she get 24 hour aid services (she currently gets 129 hours) because her sister, who helps her out when the aid isn't there, will not be at the new house with her.  Is there a way we can contact her VNS service to arrange this?  I believe she uses CDPAP program?  She is full medicare.    Thanks,    Larkin Ina

## 2014-10-24 NOTE — Telephone Encounter (Signed)
10/24/2014  team meeting. Care plan discussed.  Dr. Hassell Done report it was the mother who provided care, not the daughter.

## 2014-10-24 NOTE — Telephone Encounter (Signed)
Another phone call to pt, no answer, message left will try again later

## 2014-10-24 NOTE — Progress Notes (Addendum)
[facility]: Progress Note    Chief Complaint: Knot in right foot    Subjective:      Cindy Ochoa is a 28 yo female with hx of quadriparesis at c6, autonomic dysfunction, neurogenic bladder and bowel who presents today with complaints of knot in her right foot.    Knot in foot - patient reporting she sees a discoloration on her right foot and reports not bumping or bruising it at any point in time.  She cannot feel pain due to paraplegia, but is concerned that it is a blood clot.  She is wondering why she doesn't have IPCs at home like she gets when she's in the hospital.    Patient is excited to move into her new home in [place].  She is requesting increased home nursing aid hours to help facilitate this since her sister will not be able to take care of her when the aid isn't there (currently she gets 129 hours per week but would like 24 hour aid services daily).  She is doing well with her exercises with the nursing aid, who is continuing her home PT and OT.  She has been emptying her urostomy bag 2x daily and changing the bag every 3 days.  She has been having BMs with the pads.  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    Medications:     Current Outpatient Prescriptions on File Prior to Visit   Medication Sig Dispense Refill    BISAC-EVAC 10 MG suppository UNWRAP AND PLACE 1 SUPPOSITORY (10 MG TOTAL) RECTALLY DAILY AS NEEDED FOR CONSTIPATION 12 suppository 6    incontinence supply disposable Large pull ups - use up to 5 x daily  Dx N39.42 150 each 5    Non-System Medication Urostomy drainage bags change as needed. Dx N39.42 and  G82.54 10 each 5    disposable underpads 30"x36" (CHUX) Use 6 times daily and PRN. Dx N39.42  Incontinence without sensory awareness 180 each 5    BISAC-EVAC 10 MG suppository UNWRAP AND PLACE 1  SUPPOSITORY (10 MG TOTAL) RECTALLY DAILY AS NEEDED FOR CONSTIPATION 12 suppository 0    ibuprofen (ADVIL,MOTRIN) 600 MG tablet Take 1 tablet (600 mg total) by mouth 4 times daily as needed 50 tablet 1    cholecalciferol (VITAMIN D) 1000 UNIT tablet Take 1 tablet (1,000 Units total) by mouth daily 100 tablet 5    ibuprofen (ADVIL,MOTRIN) 800 MG tablet Take 1 tablet (800 mg total) by mouth 3 times daily as needed    FOR PAIN . 50 tablet 2    dantrolene (DANTRIUM) 50 MG capsule 50 mg po TID for 7 days then increased to 100 mg po TID. 147 capsule 0    generic DME Use as directed. 1 each 0    Ostomy Supplies (HOLLISTER REPLACEMENT FILTERS) MISC Hollister Adapter (order # [id number]). 1 each 1    disposable gloves 2 boxes Disposable Medium size gloves 200 each 6    ascorbic acid (VITAMIN C) 1000 MG tablet Take 1 tablet (1,000 mg total) by mouth daily 30 tablet 11    ARTIFICIAL TEARS 1.4 % ophthalmic solution PLACE 1 DROP INTO BOTH EYES FOUR TIMES DAILY 15 mL 10    Non-System Medication PRI AND SCREEN  DIAGNOSIS 344.04 Quadriparesis 1 each 0  white petrolatum-mineral oil (GENTEAL PM) 85-15 % ophthalmic ointment Place into both eyes nightly 3.5 mL 6    Wound Dressings (ALLEVYN AG SACRUM) PADS Apply 1 Units topically every other day 2 each 0    docusate sodium (DOK) 100 MG capsule Take 1 capsule (100 mg total) by mouth 2 times daily 60 capsule 11    Skin Protectants, Misc. OINT Apply 1 Tube topically 2 times daily   1 Tube 11    Non-System Medication One Hoyer Lift  Diagnosis:  Quadriplegic ICD-9 344.04 1 each 0     No current facility-administered medications on file prior to visit.       Medications reviewed and reconciled.   Allergies:     Allergies   Allergen Reactions    Nitrofurantoin Nausea And Vomiting    Vancomycin Hives     hives 2006 but tolerated Rx in 2010  pt states she had vancomycin in 04/2012.  Feels this is not true allergy  as she has received it recently - had no reaction     Heparin Other  (See Comments)     Thrombocytopenia;        Review of Systems:     Pertinent positives and negatives as per HPI.     Physical Exam:     Filed Vitals:    10/24/14 0949   BP: 82/52   Pulse: 76   Temp: 35.8 C (96.5 F)   TempSrc: Temporal   Height: 1.626 m (5' 4.02")   Weight: 56.7 kg (125 lb)     Wt Readings from Last 3 Encounters:   10/24/14 56.7 kg (125 lb)   09/24/14 56.926 kg (125 lb 8 oz)   07/06/14 56.7 kg (125 lb)     Body mass index is 21.45 kg/(m^2).  BP Readings from Last 3 Encounters:   10/24/14 82/52   09/24/14 177/90   07/06/14 95/63       General: Sitting in wheelchari, NAD  HEENT: NCAT, EOMI  Pulmonary: CTA b/l  Cardiovascular: RRR, normal S1/S2, no M/R/G  Abdominal: soft, nt, nd, +BS  Extremities: abraision and bony prominence in right foot    Assessment and Plan:     Cindy Ochoa is a 28 yo female with hx of quadriparesis at c6, autonomic dysfunction, neurogenic bladder and bowel who presents today with complaints of knot in her right foot.    Knot in right foot    - likely due to abraision,   - instructed patient to be careful with foot as she cannot feel pain in her legs due to quadraplegia  - reassured patient she did not have a DVT and doesn't require IPCs when outside hopsital    Quadriparesis    - patient moving into new home in [place] that's accessible for her  - will have staff contact home nursing/aid to increase her visiting nurse and aid service hours to 24 hrs per day/7 days per week  - we urged patient not to drive and to instead get a driver as it is unsafe for her to drive give blindness and neurologic condition  - refilled drainage bags for urostomy, underpads, pullups and gloves  - provided dulcolax suppository for constipation    Health Maintenance:    - pneumococcal vaccination: ordered prevnar today  - refused flu shot      [name], MD   Internal Medicine, PGY-1

## 2014-10-24 NOTE — Patient Instructions (Signed)
Please take medications as directed

## 2014-10-24 NOTE — Telephone Encounter (Signed)
Cindy Ochoa missed a call from Manuela Schwartz today and is returning her call.  Please call Delitha at [phone].

## 2014-10-25 NOTE — Telephone Encounter (Signed)
See 10/28 care manager encounter for further details.

## 2014-10-25 NOTE — Telephone Encounter (Addendum)
Spoke to [name].     She indicated that CD PAS currently provides funding for 129 hours of HHA services from [employer].   A county nurse comes out [place] twice a year to recertify her for CD PAS.  [name] states that she had already asked Cindy Ochoa to increase her hours prior to the completion of her settlement, but she was denied.     Reviewed with SIM SW.   If [name] medical condition had changed, then PCP office would need to provide new documentation of the medical situation.   In this situation, her living environment had changed.   She stated that having her mother move in with her was not an option.   Recommended that she call Cindy Ochoa and request a new evaluation for aid service hours.   Also provided phone number below to contact CD PAS case manger if needed.     [name] shared that her new finances are in a "Special needs trust", so the money is protected and her aid services should not decrease d/t her newly acquired finances.      [name] stated that she understood and agreed with the plan.   She will call SIM if she has any further question/needs.

## 2014-12-12 ENCOUNTER — Other Ambulatory Visit: Payer: Self-pay | Admitting: Internal Medicine

## 2014-12-12 MED ORDER — IBUPROFEN 800 MG PO TABS *I*
800.0000 mg | ORAL_TABLET | Freq: Three times a day (TID) | ORAL | Status: DC | PRN
Start: 2014-12-12 — End: 2015-05-06

## 2014-12-12 MED ORDER — BISACODYL 10 MG RE SUPP *I*
10.0000 mg | Freq: Every day | RECTAL | Status: DC | PRN
Start: 2014-12-12 — End: 2015-03-16

## 2014-12-12 NOTE — Telephone Encounter (Signed)
Cindy Ochoa is calling to request prescriptions for Bisacodyl 10 mg suppositories and Ibuprofen 600 mg tablets.  She is now using [facility] at [facility].  She has moved and just found out that [facility] will not deliver to her new location.  She is completely out of these medications.  Please call Francesca at [phone].

## 2014-12-19 ENCOUNTER — Telehealth: Payer: Self-pay | Admitting: Internal Medicine

## 2014-12-19 ENCOUNTER — Encounter: Payer: Self-pay | Admitting: Gastroenterology

## 2014-12-19 NOTE — Telephone Encounter (Signed)
Left message for Cindy Ochoa to refax form.

## 2014-12-19 NOTE — Telephone Encounter (Signed)
Henrene Dodge, from [employer], called inquiring if we received the form that was sent on 12/14   Caller stated that this was sent requesting repairs for patients low are loss mattress    Writer informed caller that if we do not have the form we will reach out to them  If we did not receive this please call [phone]  Caller did state she has a confidential voicemail if needed

## 2014-12-24 NOTE — Telephone Encounter (Signed)
Form received will have Dr. Hassell Done sign.

## 2014-12-31 ENCOUNTER — Other Ambulatory Visit: Payer: Self-pay | Admitting: Internal Medicine

## 2014-12-31 MED ORDER — BISACODYL 10 MG RE SUPP *I*
10.0000 mg | Freq: Every day | RECTAL | Status: DC | PRN
Start: 2014-12-31 — End: 2015-01-14

## 2014-12-31 NOTE — Telephone Encounter (Signed)
Requested Prescriptions     Pending Prescriptions Disp Refills    bisacodyl (BISAC-EVAC) 10 MG suppository 12 suppository 0     Sig: Place 1 suppository (10 mg total) rectally daily as needed   UNWRAP AND PLACE 1 SUPPOSITORY FOR CONSTIPATION

## 2014-12-31 NOTE — Telephone Encounter (Signed)
Diamond Hymon calling to request prescription(s) BISAC-EVAC 10 MG suppository to be sent to [facility], located at [facility].    Is patient out of the medication? no  Does the patient have questions regarding the medication for the nurse? yes    Patient can be reached if necessary at [phone].

## 2015-01-07 ENCOUNTER — Ambulatory Visit: Payer: Self-pay | Admitting: Urology

## 2015-01-14 ENCOUNTER — Encounter: Payer: Self-pay | Admitting: Urology

## 2015-01-14 ENCOUNTER — Ambulatory Visit: Payer: Self-pay | Admitting: Urology

## 2015-01-14 VITALS — BP 102/64 | HR 88 | Ht 65.0 in | Wt 135.0 lb

## 2015-01-14 DIAGNOSIS — N319 Neuromuscular dysfunction of bladder, unspecified: Secondary | ICD-10-CM

## 2015-01-14 MED ORDER — VITAMIN C 1000 MG PO TABS *I*
1000.0000 mg | ORAL_TABLET | Freq: Every day | ORAL | Status: DC
Start: 2015-01-14 — End: 2015-03-16
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
## 2015-01-17 ENCOUNTER — Telehealth: Payer: Self-pay | Admitting: Urology

## 2015-01-17 NOTE — Telephone Encounter (Signed)
Called pt. Re: Renal U/S on 2.10.16 @ 7:75am at [facility]  Spoke to patient and confirmed

## 2015-01-24 ENCOUNTER — Ambulatory Visit: Payer: Self-pay | Admitting: Physical Medicine and Rehabilitation

## 2015-02-05 ENCOUNTER — Other Ambulatory Visit: Payer: Self-pay | Admitting: Internal Medicine

## 2015-02-05 NOTE — Telephone Encounter (Signed)
MD Unavailable routing to team grouper 02/05/2015 12:20 PM

## 2015-02-06 ENCOUNTER — Ambulatory Visit
Admit: 2015-02-06 | Discharge: 2015-02-06 | Disposition: A | Discharge status: Home / Self Care | Payer: Self-pay | Source: Ambulatory Visit | Attending: Urology | Admitting: Urology

## 2015-02-06 ENCOUNTER — Telehealth: Payer: Self-pay | Admitting: Urology

## 2015-02-06 NOTE — Telephone Encounter (Signed)
Patient and I spoke. Reviewed renal US - no hydronephrosis. Non obstructing left mid kidney stone 1.3 x 1.1. 0.2 cm. Will refer to Dr. Gilda Crease practice for treatment recommendation.

## 2015-02-08 NOTE — Telephone Encounter (Signed)
Cindy Ochoa and I spoke. Could you please call patient and schedule an OV with Dr. Jorene Guest? EE will decide on the further images at time of visit.     Thanks,  Judson Roch

## 2015-02-12 ENCOUNTER — Encounter: Payer: Self-pay | Admitting: Urology

## 2015-02-12 NOTE — Telephone Encounter (Signed)
Spoke to pt.  Sched for 2/25

## 2015-02-13 ENCOUNTER — Ambulatory Visit: Payer: Self-pay | Admitting: Physical Medicine and Rehabilitation

## 2015-02-21 ENCOUNTER — Ambulatory Visit: Payer: Self-pay | Admitting: Internal Medicine

## 2015-02-21 ENCOUNTER — Ambulatory Visit
Admit: 2015-02-21 | Discharge: 2015-02-21 | Disposition: A | Discharge status: Home / Self Care | Payer: Self-pay | Source: Ambulatory Visit | Attending: Urology | Admitting: Urology

## 2015-02-21 ENCOUNTER — Encounter: Payer: Self-pay | Admitting: Urology

## 2015-02-21 ENCOUNTER — Encounter: Payer: Self-pay | Admitting: Internal Medicine

## 2015-02-21 ENCOUNTER — Ambulatory Visit: Payer: Self-pay | Admitting: Urology

## 2015-02-21 VITALS — BP 89/56 | HR 125 | Temp 77.0°F | Ht 65.0 in | Wt 135.0 lb

## 2015-02-21 VITALS — BP 90/56 | HR 100 | Temp 96.3°F | Ht 65.0 in | Wt 133.0 lb

## 2015-02-21 DIAGNOSIS — N200 Calculus of kidney: Secondary | ICD-10-CM

## 2015-02-21 DIAGNOSIS — M62838 Other muscle spasm: Secondary | ICD-10-CM

## 2015-02-21 DIAGNOSIS — G8254 Quadriplegia, C5-C7 incomplete: Secondary | ICD-10-CM

## 2015-02-21 DIAGNOSIS — N319 Neuromuscular dysfunction of bladder, unspecified: Secondary | ICD-10-CM

## 2015-02-21 DIAGNOSIS — I959 Hypotension, unspecified: Secondary | ICD-10-CM

## 2015-02-21 HISTORY — DX: Calculus of kidney: N20.0
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
## 2015-02-22 ENCOUNTER — Encounter: Payer: Self-pay | Admitting: Gastroenterology
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
## 2015-02-25 ENCOUNTER — Telehealth: Payer: Self-pay | Admitting: Urology

## 2015-02-25 NOTE — Telephone Encounter (Signed)
FAXED DOCUMENTATION TO [place]

## 2015-03-01 ENCOUNTER — Other Ambulatory Visit: Payer: Self-pay | Admitting: Internal Medicine

## 2015-03-07 ENCOUNTER — Telehealth: Payer: Self-pay | Admitting: Urology

## 2015-03-07 NOTE — Telephone Encounter (Signed)
Please see closed telephone encounter from 2/29.    Patient is wondering what the status of her CT approval and scheduling is. Please contact her to advise.

## 2015-03-12 ENCOUNTER — Telehealth: Payer: Self-pay | Admitting: Internal Medicine

## 2015-03-12 NOTE — Telephone Encounter (Signed)
Patient is calling to speak with social worker Renee regarding her RGE bill.  Patient states that her bill is due the 17th of this month and she is not able to pay it by that day and she can not afford to be in the dark.  Please call patient back to advise @ [phone]

## 2015-03-12 NOTE — Telephone Encounter (Signed)
Attempted to call patient  but per HIPAA I cannot leave a message. If patient calls back please inform patient of CT scan that is scheduled on 3/18 @ 9AM  At [facility] imaging. Patient can have nothing to eat or drink 2 hours prior to appointment.

## 2015-03-12 NOTE — Telephone Encounter (Signed)
Writer and patient spoke regarding her current RG&E situation  Patient notes that since her settlement and the establishment of her trust fund her bills are handled by [employer].  Per patient when she moved to her new home she did not know her RG&E account number had changed and she had had the trust fund paying on the old account. RG&E worked with her to have the funds in the old account transferred to the new account.           Writer and patient did conference calls and spoke with the following people             1) Freda Munro @ [place] [phone] who was able to offer an extension until 2/22 for submission of a payment.  RG&E will send a copy of her bill to her and she will be able to submit it to the [employer]           2)  [place] for [employer] [phone]). They advised [place] that we needed to speak with [facility] Team 32 (main team # [id number]/ Person assigned to her alpha split [phone]) to do an Emergency HEAP grant.            3) [employer] Team #32 [phone]) who completed an Emergency HEAP application and will forward it onto the HEAP intake person for the day. We were old if there were any questions regarding this application Grecia would be contacted directly.

## 2015-03-13 ENCOUNTER — Telehealth: Payer: Self-pay | Admitting: Internal Medicine

## 2015-03-13 ENCOUNTER — Telehealth: Payer: Self-pay | Admitting: Urology

## 2015-03-13 NOTE — Telephone Encounter (Signed)
Spoke to patient who has been having multiple issues with her aide service.  (Patient is a quadriplegic and cannot feel from the chest down.)     Her mother states that she noticed discoloration to her daughter's toes and became concerned.  None of the aides reported this and patient is adamant she has not had any trauma and has not been banging her feet.     Patient and her mother state that on her right foot; great toe, she has a large bruise that is black in color.  There is a soft spot in the center where her mother fears she will is the beginning of skin breakdown.      On the left foot, on all the tips of her toes, the color is dark blue.  Feet are not cold to touch.  Patient has + capillary refill. Patient denies SOB and chest pain.  Denies fever and chills.     Yesterday, she had severe spasms from her stomach.  Her mother states that when her daughter has severe spasms, this is usually an indication of "something going on in her body."    Patient has a 30 years old and trouble with childcare.    Stated the only day she could come in was Monday, 03/18/15.     Explained to mother and daughter that patient could in fact have developed a blood clot, could have infection, and could be at risk for sepsis, or losing her toes due to necrosis.    Explained that patient should go to ED to be assessed as she is at great risk for multiple issues.    Patient stated she would get childcare for her son and would go immediately to the hospital.    Communications nurse in [facility] ED called and notified of patient's imminent arrival.

## 2015-03-13 NOTE — Telephone Encounter (Signed)
Patient is quadriplegic and her mother noticed that her feet are discolored and have blood clots on the tip of her toes. Her feet are bruised and black and blue but she has not injured them. Patient has appointment set up for 03/18/15. States that she is noticing more spasms in her legs and feet.    Patient can be reached at [phone] if necessary.

## 2015-03-13 NOTE — Telephone Encounter (Signed)
Authorization Status: [id number]    Type:  CT scan    Location:  [facility]    Date:  Friday March 15 2015    Time:   9:00 AM    Blood work Needed:  No     Prep:  nothing to eat or drink 2 hours prior to exam --NEEDS HOYER    Patient Informed:  Yes      If the patient would like to reschedule imaging, they must call Radiology directly at [phone].

## 2015-03-14 ENCOUNTER — Telehealth: Payer: Self-pay | Admitting: Internal Medicine

## 2015-03-14 MED ORDER — NON-SYSTEM MEDICATION *A*
Status: DC
Start: 2015-03-14 — End: 2017-11-05

## 2015-03-14 NOTE — Telephone Encounter (Signed)
Left message for Debrina that this was faxed as requested.

## 2015-03-14 NOTE — Telephone Encounter (Signed)
Patient is calling to request that an order for a shower chair be faxed to [facility]. The fax is [phone] attn: Hinton Dyer.  Patient would like a call back when this is completed. She can be reached at [phone].

## 2015-03-15 ENCOUNTER — Ambulatory Visit: Admit: 2015-03-15 | Payer: Self-pay | Source: Ambulatory Visit | Admitting: Urology

## 2015-03-16 ENCOUNTER — Encounter: Payer: Self-pay | Admitting: Emergency Medicine

## 2015-03-16 ENCOUNTER — Inpatient Hospital Stay
Admission: EM | Admit: 2015-03-16 | Disposition: A | Discharge status: Home / Self Care | Payer: Self-pay | Source: Ambulatory Visit | Attending: Internal Medicine | Admitting: Internal Medicine

## 2015-03-16 DIAGNOSIS — A419 Sepsis, unspecified organism: Secondary | ICD-10-CM | POA: Diagnosis present

## 2015-03-16 DIAGNOSIS — N390 Urinary tract infection, site not specified: Principal | ICD-10-CM | POA: Diagnosis present

## 2015-03-16 HISTORY — DX: Sepsis, unspecified organism: A41.9

## 2015-03-16 LAB — PLASMA PROF 7 (ED ONLY)
Anion Gap,PL: 16 (ref 7–16)
CO2,Plasma: 22 mmol/L (ref 20–28)
Chloride,Plasma: 102 mmol/L (ref 96–108)
Creatinine: 0.53 mg/dL (ref 0.51–0.95)
GFR,Black: 148 *
GFR,Caucasian: 128 *
Glucose,Plasma: 114 mg/dL — ABNORMAL HIGH (ref 60–99)
Potassium,Plasma: 4.1 mmol/L (ref 3.4–4.7)
Sodium,Plasma: 140 mmol/L (ref 132–146)
UN,Plasma: 12 mg/dL (ref 6–20)

## 2015-03-16 LAB — CBC AND DIFFERENTIAL
Baso # K/uL: 0 10*3/uL (ref 0.0–0.1)
Basophil %: 0.1 %
Eos # K/uL: 0 10*3/uL (ref 0.0–0.4)
Eosinophil %: 0.1 %
Hematocrit: 42 % (ref 34–45)
Hemoglobin: 13.7 g/dL (ref 11.2–15.7)
IMM Granulocytes #: 0 10*3/uL (ref 0.0–0.1)
IMM Granulocytes: 0.1 %
Lymph # K/uL: 2.6 10*3/uL (ref 1.2–3.7)
Lymphocyte %: 35.6 %
MCH: 31 pg/cell (ref 26–32)
MCHC: 32 g/dL (ref 32–36)
MCV: 96 fL — ABNORMAL HIGH (ref 79–95)
Mono # K/uL: 0.4 10*3/uL (ref 0.2–0.9)
Monocyte %: 5.8 %
Neut # K/uL: 4.2 10*3/uL (ref 1.6–6.1)
Nucl RBC # K/uL: 0 10*3/uL (ref 0.0–0.0)
Nucl RBC %: 0 /100 WBC (ref 0.0–0.2)
Platelets: 77 10*3/uL — ABNORMAL LOW (ref 160–370)
RBC: 4.4 MIL/uL (ref 3.9–5.2)
RDW: 14.7 % — ABNORMAL HIGH (ref 11.7–14.4)
Seg Neut %: 58.3 %
WBC: 7.3 10*3/uL (ref 4.0–10.0)

## 2015-03-16 LAB — URINALYSIS WITH MICROSCOPIC
Ketones, UA: NEGATIVE
Nitrite,UA: POSITIVE — AB
Protein,UA: 100 mg/dL — AB
RBC,UA: 2 /hpf (ref 0–2)
Specific Gravity,UA: 1.012 (ref 1.002–1.030)
WBC,UA: 73 /hpf — ABNORMAL HIGH (ref 0–5)
pH,UA: 7 (ref 5.0–8.0)

## 2015-03-16 LAB — LACTATE, PLASMA: Lactate: 3.5 mmol/L (ref 0.5–2.2)

## 2015-03-16 LAB — RUQ PANEL (ED ONLY)
ALT: 15 U/L (ref 0–35)
AST: 16 U/L (ref 0–35)
Albumin: 4.4 g/dL (ref 3.5–5.2)
Alk Phos: 54 U/L (ref 35–105)
Amylase: 54 U/L (ref 28–100)
Bilirubin,Direct: 0.2 mg/dL (ref 0.0–0.3)
Bilirubin,Total: 0.5 mg/dL (ref 0.0–1.2)
Lipase: 29 U/L (ref 13–60)
Total Protein: 7.4 g/dL (ref 6.3–7.7)

## 2015-03-16 LAB — INFLUENZA  A & B/RSV PCR: Influenza A&B/RSV PCR: 0

## 2015-03-16 LAB — PREGNANCY TEST, SERUM: Preg,Serum: NEGATIVE

## 2015-03-16 MED ORDER — PIPERACILLIN-TAZOBACTAM IN D5W 4.5 GM/100ML IV SOLN *I*
4.5000 g | Freq: Once | INTRAVENOUS | Status: AC
Start: 2015-03-16 — End: 2015-03-17
  Administered 2015-03-17: 4.5 g via INTRAVENOUS
  Filled 2015-03-16: qty 100

## 2015-03-16 MED ORDER — SODIUM CHLORIDE 0.9 % IV SOLN WRAPPED *I*
125.0000 mL/h | Status: DC
Start: 2015-03-16 — End: 2015-03-17
  Administered 2015-03-16 – 2015-03-17 (×2): 125 mL/h via INTRAVENOUS

## 2015-03-16 MED ORDER — SODIUM CHLORIDE 0.9 % IV BOLUS *I*
1000.0000 mL | Freq: Once | Status: AC
Start: 2015-03-16 — End: 2015-03-17
  Administered 2015-03-17: 1000 mL via INTRAVENOUS

## 2015-03-16 MED ORDER — SODIUM CHLORIDE 0.9 % IV BOLUS *I*
1000.0000 mL | Freq: Once | Status: AC
Start: 2015-03-16 — End: 2015-03-16
  Administered 2015-03-16: 1000 mL via INTRAVENOUS

## 2015-03-16 NOTE — ED Notes (Signed)
C/o flu like symptoms, nausea, emesis, diarrhea, chills pt is a quadriplegic
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
## 2015-03-16 NOTE — ED Notes (Signed)
Patient comes in via EMS with complaint of chills, n/v/d. Patient is quadrapalegic and also states "banged right big toe on Wednesday". Right Great toe has large dark spot underneath. Denies other symptoms.
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
## 2015-03-17 LAB — BASIC METABOLIC PANEL
Anion Gap: 11 (ref 7–16)
CO2: 22 mmol/L (ref 20–28)
Calcium: 7.6 mg/dL — ABNORMAL LOW (ref 8.8–10.2)
Chloride: 110 mmol/L — ABNORMAL HIGH (ref 96–108)
Creatinine: 0.51 mg/dL (ref 0.51–0.95)
GFR,Black: 150 *
GFR,Caucasian: 130 *
Glucose: 113 mg/dL — ABNORMAL HIGH (ref 60–99)
Lab: 10 mg/dL (ref 6–20)
Potassium: 3 mmol/L — ABNORMAL LOW (ref 3.3–5.1)
Sodium: 143 mmol/L (ref 133–145)

## 2015-03-17 LAB — CBC AND DIFFERENTIAL
Baso # K/uL: 0 10*3/uL (ref 0.0–0.1)
Basophil %: 0.2 %
Eos # K/uL: 0 10*3/uL (ref 0.0–0.4)
Eosinophil %: 0.2 %
Hematocrit: 34 % (ref 34–45)
Hemoglobin: 10.9 g/dL — ABNORMAL LOW (ref 11.2–15.7)
IMM Granulocytes #: 0 10*3/uL (ref 0.0–0.1)
IMM Granulocytes: 0.2 %
Lymph # K/uL: 1.9 10*3/uL (ref 1.2–3.7)
Lymphocyte %: 42.3 %
MCH: 31 pg/cell (ref 26–32)
MCHC: 32 g/dL (ref 32–36)
MCV: 97 fL — ABNORMAL HIGH (ref 79–95)
Mono # K/uL: 0.3 10*3/uL (ref 0.2–0.9)
Monocyte %: 7.2 %
Neut # K/uL: 2.2 10*3/uL (ref 1.6–6.1)
Nucl RBC # K/uL: 0 10*3/uL (ref 0.0–0.0)
Nucl RBC %: 0 /100 WBC (ref 0.0–0.2)
Platelets: 63 10*3/uL — ABNORMAL LOW (ref 160–370)
RBC: 3.5 MIL/uL — ABNORMAL LOW (ref 3.9–5.2)
RDW: 14.8 % — ABNORMAL HIGH (ref 11.7–14.4)
Seg Neut %: 49.9 %
WBC: 4.5 10*3/uL (ref 4.0–10.0)

## 2015-03-17 LAB — FOLATE: Folate: 10.9 ng/mL (ref >=4.6)

## 2015-03-17 LAB — VITAMIN B12: Vitamin B12: 318 pg/mL (ref 211–946)

## 2015-03-17 LAB — LACTATE, PLASMA: Lactate: 0.6 mmol/L (ref 0.5–2.2)

## 2015-03-17 MED ORDER — POTASSIUM CHLORIDE CRYS CR 20 MEQ PO TBCR *I*
40.0000 meq | ORAL_TABLET | Freq: Once | ORAL | Status: AC
Start: 2015-03-17 — End: 2015-03-17
  Administered 2015-03-17: 40 meq via ORAL
  Filled 2015-03-17: qty 2

## 2015-03-17 MED ORDER — BISACODYL 10 MG RE SUPP *I*
10.0000 mg | RECTAL | Status: DC
Start: 2015-03-17 — End: 2015-03-19
  Administered 2015-03-17: 10 mg via RECTAL

## 2015-03-17 MED ORDER — CEFTRIAXONE SODIUM 1G IN DEXTROSE 50 ML *I*
1.0000 g | INTRAVENOUS | Status: DC
Start: 2015-03-17 — End: 2015-03-19
  Administered 2015-03-17 – 2015-03-19 (×3): 1 g via INTRAVENOUS
  Filled 2015-03-17 (×4): qty 50

## 2015-03-17 MED ORDER — IBUPROFEN 800 MG PO TABS *I*
800.0000 mg | ORAL_TABLET | Freq: Three times a day (TID) | ORAL | Status: DC | PRN
Start: 2015-03-17 — End: 2015-03-19
  Administered 2015-03-18 (×2): 800 mg via ORAL
  Filled 2015-03-17 (×12): qty 1

## 2015-03-17 MED ORDER — CEFTRIAXONE SODIUM 1G IN DEXTROSE 50 ML *I*
1.0000 g | INTRAVENOUS | Status: DC
Start: 2015-03-17 — End: 2015-03-17

## 2015-03-17 MED ORDER — LACTATED RINGERS IV SOLN *I*
125.0000 mL/h | INTRAVENOUS | Status: DC
Start: 2015-03-17 — End: 2015-03-18
  Administered 2015-03-17 (×2): 125 mL/h via INTRAVENOUS

## 2015-03-17 MED ORDER — BISACODYL 10 MG RE SUPP *I*
10.0000 mg | Freq: Every day | RECTAL | Status: DC | PRN
Start: 2015-03-17 — End: 2015-03-17

## 2015-03-17 MED ORDER — ARTIFICIAL TEARS (POLYVINYL ALCOHOL) 1.4 % OP SOLN *I*
1.0000 [drp] | Freq: Four times a day (QID) | OPHTHALMIC | Status: DC
Start: 2015-03-17 — End: 2015-03-19
  Administered 2015-03-17 – 2015-03-19 (×8): 1 [drp] via OPHTHALMIC
  Filled 2015-03-17: qty 15
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
## 2015-03-17 NOTE — ED Notes (Signed)
Spoke with kelly and gave report

## 2015-03-17 NOTE — ED Notes (Signed)
ED RN INTERN ATTESTATION       I Loel Ro, RN (RN) reviewed the following charting information by the RN intern:RM    Nursing Assessments  Medications  Plan of Care  Teaching   Notes    In the chart of Cindy Ochoa (29 y.o. female) and attest to the charting being accurate.

## 2015-03-18 ENCOUNTER — Ambulatory Visit: Payer: Self-pay | Admitting: Internal Medicine

## 2015-03-18 LAB — CBC AND DIFFERENTIAL
Baso # K/uL: 0 10*3/uL (ref 0.0–0.1)
Basophil %: 0.2 %
Eos # K/uL: 0.1 10*3/uL (ref 0.0–0.4)
Eosinophil %: 2.1 %
Hematocrit: 34 % (ref 34–45)
Hemoglobin: 10.9 g/dL — ABNORMAL LOW (ref 11.2–15.7)
IMM Granulocytes #: 0 10*3/uL (ref 0.0–0.1)
IMM Granulocytes: 0.2 %
Lymph # K/uL: 2.2 10*3/uL (ref 1.2–3.7)
Lymphocyte %: 52.1 %
MCH: 31 pg/cell (ref 26–32)
MCHC: 32 g/dL (ref 32–36)
MCV: 96 fL — ABNORMAL HIGH (ref 79–95)
Mono # K/uL: 0.4 10*3/uL (ref 0.2–0.9)
Monocyte %: 8.8 %
Neut # K/uL: 1.5 10*3/uL — ABNORMAL LOW (ref 1.6–6.1)
Nucl RBC # K/uL: 0 10*3/uL (ref 0.0–0.0)
Nucl RBC %: 0 /100 WBC (ref 0.0–0.2)
Platelets: 59 10*3/uL — ABNORMAL LOW (ref 160–370)
RBC: 3.5 MIL/uL — ABNORMAL LOW (ref 3.9–5.2)
RDW: 14.3 % (ref 11.7–14.4)
Seg Neut %: 36.6 %
WBC: 4.2 10*3/uL (ref 4.0–10.0)

## 2015-03-18 LAB — BASIC METABOLIC PANEL
Anion Gap: 15 (ref 7–16)
CO2: 21 mmol/L (ref 20–28)
Calcium: 8.4 mg/dL — ABNORMAL LOW (ref 8.8–10.2)
Chloride: 108 mmol/L (ref 96–108)
Creatinine: 0.56 mg/dL (ref 0.51–0.95)
GFR,Black: 145 *
GFR,Caucasian: 126 *
Glucose: 92 mg/dL (ref 60–99)
Lab: 9 mg/dL (ref 6–20)
Potassium: 3.5 mmol/L (ref 3.3–5.1)
Sodium: 144 mmol/L (ref 133–145)

## 2015-03-18 NOTE — Plan of Care (Signed)
Impaired Mobility (musc)     Patient's mobility is maintained or improved (musc) Maintaining        Mobility     Patient's functional status is maintained or improved Maintaining        Nutrition     Patient's nutritional status is maintained or improved Maintaining        Pain/Comfort     Patient's pain or discomfort is manageable Maintaining        Psychosocial     Demonstrates ability to cope with illness Maintaining        Safety     Patient will remain free of falls Maintaining          Potential for impaired Circulation, Motor, or Sensation     Patient's CMS status is maintained or improved from baseline Progressing towards goal          Pt A&Ox3. VSS on RA. PIV flushed and patent. Incontinent; BM x1. Urostomy intact; draining hazy, yellow urine. No acute events overnight. Call bell in reach, makes needs known. Cindy Foster, RN

## 2015-03-18 NOTE — Progress Notes (Signed)
Hospital Medicine Attending Progress Note  LOS: 2 days     Any Significant 24 Hour Events:     No acute events.   Subjective:                                         Doing well today. Denies complaints. No longer having abdominal pain, back pain. No fevers or chills. No chest pain or SOB. No cough or congestion. Feels at baseline. Normal BMs.   Objective:                                 Physical Exam  BP 118/70 mmHg   Pulse 69   Temp(Src) 36.8 C (98.2 F) (Oral)   Resp 18   Ht 1.651 m (5\' 5" )   Wt 61.236 kg (135 lb)   BMI 22.47 kg/m2   SpO2 99%     General Constitutional: lying in bed, NAD  HEENT: MMM, anicteric. Prior tracheostomy scar.   Cardiovascular:  RRR, no murmurs  Pulmonary:  Normal WOB, CTAB  Gastrointestinal: BS normal. Soft, non tender, non distended. urostomy in place on right.   Muskuloskeletal:  No edema. WWP. Contracted legs.   Skin:  No rashes noted  Neurologic:  quadriplegic. Speech wnl.     Recent Lab, Micro, and Imaging Studies  Personally reviewed and notable for: K 3.5. Cr 0.56. Ca 8.4. Lactate 0.6 from 3.5. WBC 4.2. HCT 34, plts 59 from 77 at admission. Folate 10.9. B12 318.     UC with >100k E coli and 50K proteus.   Assessment:   Ms. Gullickson is a 29 year old woman with a history of traumatic C6 quadriplegia with autonomic dysfunction and neurogenic bladder, as well as nephrolithiasis and thrombocytopenia who presented with sepsis due to a urinary source.   Plans:     Sepsis from urinary source: UC with e coli (>100K) and proteus (50K). Based on prior sensitivities, ceftriaxone is appropriate. Bactrim may also cover both of these, as long as upper tract infection not occuring.   -Continue ceftriaxone 1 g daily   -follow cultures for sensitivities   - blood cultures NGTD, continue to follow-up   -stop IVF  -given her previously  Noted left sided kidney stones, and outpatient plan to get CT to further evaluate these, it seems pertinent to know if these are present, as it would guide therapy  duration and antibiotic choice. Agree with CT scan ordered as outpatient, which can be done as inpatient as it will affect length and choice of therapy. Will attempt to get this done today or tomorrow.     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     Chronic Constipation  -reported diarrhea prior to admission   -continue home suppository Q48H     Chronic Thrombocytopenia: no heparinoids.   -monitor on daily CBC    Macrocytic Anemia: Folate and B12 normal.   -HCT stable from yesterday.   -CBC daily to monitor     F: stop IVF  E: daily  N: regular diet   Prophylaxis: IPCs  Disposition: likely home tomorrow after sensitivities return.   Full Code    Discussed with Romana Juniper, NP.  Author: Charmian Muff, MD, Attending Hospitalist, on 03/18/2015 at 1:37 PM. Pager: [phone]

## 2015-03-18 NOTE — Discharge Instructions (Addendum)
Brief Summary of Your Hospital Course: You were admitted for evaluation and treatment of your fever, nausea, vomiting and diarrhea. You were started on IV antibiotics for a urinary tract infection.  A CT scan of abdomen and pelvis was done which showed: a nonobstructing stone in your left kidney, small abscess in your left buttock and a right ovarian cyst.       What to do after you leave the hospital:  Please call your PCP Dr. Earlie Server office to schedule a follow-up appointment with her in her office within 1 week or sooner, if needed.    Medication changes:  -In addition to taking your home meds as you were taking prior to admission, you are to also take antibiotic Bactrim DS twice a day for 7 more days, to complete total of 10 day course of antibiotics.    Recommended diet: regular diet     Recommended activity: as tolerated    Home care:     If you experience any of these symptoms 24 hours or more after discharge:  fever, chills, temperature >100.5 F nausea, vomiting, or any other concerning symptoms call Dr. [name] at [phone]     If you experience any of these symptoms within the first 24 hours after discharge call Dr. Judeth Horn at [phone].    Smoking    Smoking can increase your chances of developing chronic health problems or worsen conditions you already have.  If you smoke you should quit. Smoking cessation information has been given to you for your review to help you quit.  Medications to help you quit are available.  Ask your doctor if you would like to receive these medications.       Medications  Your doctor has prescribed medications to improve or manage your condition (such as antibiotics, pain medications, ACE inhibitors, beta blockers, diuretics and aspirin).  You should take them as prescribed by your doctor.  Ask your doctor for any questions regarding these medications.      Diet  A healthy diet is important to help you stay well.  Some health conditions require you to be on a special  diet. For example, if you have heart failure you should monitor your fluid intake and limit the amount of sodium including table salt.  This will help you avoid fluid retention which can cause shortness of breath or swelling of the feet and ankles.  Reading food labels is helpful when you are on a special diet.  Follow instructions from your doctor for any other special dietary requirement.    Exercise/Activity  Activity and exercise are important to your well being.  While you are in the hospital your activity may be restricted.  As your condition improves your activity level will be increased.  Most patients will be able to gradually resume activity as before.  You should follow your doctors activity recommendations        What to do if your condition changes?  Once you leave the hospital you should contact your doctors office to make a follow up appointment.  If at any time you have any questions or concerns or your condition gets worse, contact your physician.  If you can not reach your physician or you develop life threatening symptoms such as trouble breathing or chest pain you should go to the closest Emergency Department.     Education  You may receive additional information on your specific condition before you are discharged.  Please ask your nurse or doctor  for your information before you leave the hospital.

## 2015-03-18 NOTE — Progress Notes (Signed)
Utilization Management    Level of Care Inpatient as of the date 03/16/2015      Bland Span, RN     Pager: [phone]

## 2015-03-18 NOTE — Consults (Signed)
Wound Care Service Consult Note    Patient Location: 514-03    Cindy Ochoa is a 29 y.o. year old female is referred by the patient's covering team for evaluation and treatment recommendations regarding: skin breakdown over sacral scar r/o pressure ulcer. HPI, PMH/PSH, Labs, MAR reviewed.    Current treatment: OTA  Wound first identified/observed on admission    Patient c/o:    []  Pain  []  Burning   []  Itching  []  Odor  [x]  No complaints at this time    Physical Exam:      Patient alert, awake, NAD, reports area over midline buttocks/sacrum has been healed for some time and skin breakdown is d/t hoyer pad/shear injury over scar tissue from previous pressure ulcer.  Right ischium with hypopigmentation and contractures from previous pressure ulcer, all tissue intact, protective Allevyn gentle foam applied to minimize friction/shear over area.  She reports she is on bowel program, moving bowels every other day without issues.    Wound #1: Sacrum Stage: N/a  Suspected underlying etiology: Partial thickness friction/shear injury over scar  Measurements:  0.5 x 0.5 x 0.2 cm  Undermining:  None  Tunneling: None  Wound bed: 100% clean, red dermis2  Edges: Open  Periwound skin: Hypopigmented, intact   Drainage: Scant   Odor after cleansing: None  At today's visit the topical care applied was: cleansed with NS, Cavilon applied, covered with Allevyn gentle foam     Patient Education:  Discussed treatment recommendations with patient and nursing.    Treatment Recommendations:   Order Prealbumin today and every Monday   Nutrition Consult   Turn and position every two hours and PRN, if not independent with bed mobility.   Offloading: continue LAL mattress set for appropriate ht and wt, float b/l heels off of mattress AAT.   Topical recommendations:     Sacrum and right ischium - Cleanse with Normal saline and gauze, apply Cavilon, cover with Allevyn gentle foam. Frequency of dressing change: Weekly and PRN contamination  and/or strike through drainage.         Ostomy Care Service Consult Note    HPI:   Cindy Ochoa is a 29 y.o. year old female is referred by the patient's covering team for consultation/ evaluation of the stoma, and education regarding stoma care, and appliance recommendations.   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 Lamar Blinks. H&P reviewed.      Unit:     Supplies patient currently using at home: Hollister 2 piece 1 3/4" flat wafer with urostomy pouch    Physical Exam:    Stoma:   Location: Right LQ    Color: Red, viable    Protrusion: 1 cm   Measures: 1 1/8" in diameter.     MCJ: Intact    Ostomy output: Light yellow with sediment    Peristomal: C/D/I   Erythema: None    Location: N/a   Erosion: None   Ulcers: None      Assessment/Plan:  Education was provided today including:   - Appliance in hospital: Hollister 2 piece 1 3/4" flat wafer with urostomy pouch.   - Accessories: Urostomy adapter, nighttime drainage bag.         Patient is familiar with routine care of her urostomy, she has aides at home that change it every 3 days.  Please change wafer every 3 days and empty pouch with 1/3 to 1/2 full.  Nursing updated on plan of care,  questions answered and patient verbalized understanding.         Thank you very much for this consult.  Please call with any questions or concerns.  Rinaldo Cloud RN, [place]

## 2015-03-18 NOTE — Progress Notes (Signed)
Pt A and O x3. VSS. On RA. PIV x1, patent. IV fluids stopped as ordered. Tolerating diet, denies nausea. On bedrest, turn Q2 and PRN. Urostomy draining well. No stool noted. Call appropriately for assistance. Denies pain.

## 2015-03-18 NOTE — Progress Notes (Signed)
Patient stating discomfort from increased muscle spasms overnight and unable to sleep.  Feliciana Rossetti paged and made aware
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
## 2015-03-19 LAB — BASIC METABOLIC PANEL
Anion Gap: 14 (ref 7–16)
CO2: 24 mmol/L (ref 20–28)
Calcium: 8.8 mg/dL (ref 8.8–10.2)
Chloride: 104 mmol/L (ref 96–108)
Creatinine: 0.43 mg/dL — ABNORMAL LOW (ref 0.51–0.95)
GFR,Black: 158 *
GFR,Caucasian: 137 *
Glucose: 81 mg/dL (ref 60–99)
Lab: 9 mg/dL (ref 6–20)
Potassium: 3.7 mmol/L (ref 3.3–5.1)
Sodium: 142 mmol/L (ref 133–145)

## 2015-03-19 LAB — CBC AND DIFFERENTIAL
Baso # K/uL: 0 10*3/uL (ref 0.0–0.1)
Basophil %: 0 %
Eos # K/uL: 0.3 10*3/uL (ref 0.0–0.4)
Eosinophil %: 5.2 %
Hematocrit: 36 % (ref 34–45)
Hemoglobin: 12 g/dL (ref 11.2–15.7)
Lymph # K/uL: 2.7 10*3/uL (ref 1.2–3.7)
Lymphocyte %: 46.1 %
MCH: 31 pg/cell (ref 26–32)
MCHC: 33 g/dL (ref 32–36)
MCV: 93 fL (ref 79–95)
Mono # K/uL: 0.3 10*3/uL (ref 0.2–0.9)
Monocyte %: 6.1 %
Neut # K/uL: 2.1 10*3/uL (ref 1.6–6.1)
Nucl RBC # K/uL: 0 10*3/uL (ref 0.0–0.0)
Nucl RBC %: 0 /100 WBC (ref 0.0–0.2)
Platelets: 61 10*3/uL — ABNORMAL LOW (ref 160–370)
RBC: 3.9 MIL/uL (ref 3.9–5.2)
RDW: 14 % (ref 11.7–14.4)
Seg Neut %: 38.3 %
WBC: 5.4 10*3/uL (ref 4.0–10.0)

## 2015-03-19 LAB — DIFF MANUAL
Diff Based On: 115 CELLS
React Lymph %: 4 % (ref 0–6)

## 2015-03-19 MED ORDER — SULFAMETHOXAZOLE-TRIMETHOPRIM 800-160 MG PO TABS *I*
1.0000 | ORAL_TABLET | Freq: Two times a day (BID) | ORAL | Status: AC
Start: 2015-03-19 — End: 2015-03-26

## 2015-03-19 NOTE — Progress Notes (Signed)
[place] [facility]  Transportation Request Form / Physician Certification Statement   Please fax request with face sheet to [facility] Coordinator: Fax [phone], Phone [phone] or [place] [facility]: Fax [phone], Phone [phone]    Patient Name:  Cindy Ochoa     Date of Birth:  12-14-1986    Date of Service: March 19, 2015  Requested time of pick up: 2:30pm    Patient Location:  [phone]     MR#  [id number]  Patient Destination: [address] [place] [address]    Number of steps into house?: ramp    Requestors Name: Jannette Fogo Dahlila Pfahler, LMSW Call Back Number:  [phone]  Payor : medicaid    Transport for (check what type of treatment or service, at least one):    [x]  Discharge              []  Diagnostic Test      []  Evaluation Test         []  Procedure      Specify what type of treatment or service: home   Is this treatment or service available at sending facility?:      []  Yes    [x]  No    Requested Mode of Transport  []  One man Chairmobile** []  Two man Chairmobile**  (** See instruction  form)  Leg extension required:     []  Yes  []  No  Patient has own chair:  []  Yes    []  No   Wheelchair size:         []  Standard []  X-wide  []  XX-wide  []  Round Trip []  One Way  [x]  Stretcher Lucianne Lei (no medical attention needed)    []  [facility]  []  [facility]  VENDOR: [facility] transportation___   1. Medical condition that necessitates this mode of transport (i.e. oxygen, bed ridden, etc.):  Patient requires assistance with transfers for safety and comfort. Patent is a quadriplegic.    2. What medical services are to be provided by crew?   []  Oxygen Monitoring:  Amount:      LPM  Can patient self-administer oxygen?  []  Yes []  No - What is limitation?        Does patient have own portable tank?    []  Yes     []  No   []  Airway Monitoring []  Monitor IV infusion []  Suctioning     []  Cardiac Monitoring []  Vital Signs  []  PRN Meds  []  Other_______________________________  [x]  None    3. Infection  control needs (i.e. ORSA/VRE/Cdiff)       []  Yes    [x]  No    4. What specific handling is required?    [x]  Positioning []  Orthopedic Device  []  Restraints []  Other:        Reason for above:  []  Flight Risk     []  Severe Pain []  Morbid Obesity       [x]  Fall Precaution   []  Ortho/Spine Precaution         []  Decubitis > Stage 2 []  Risk of injury to self/others    5.  Patient mental status? [x]  Normal cognition []  Disoriented      Specify:        []  Psychological Disorder Specify:       6. At time of transport is bed confinement ordered?  []  Yes     [x]  No  Is patient bed confined?    []  Yes     [x]  No    Medical condition for bed confinement:     Social work name:  Jannette Fogo Zae Kirtz, LMSW         Date:  March 19, 2015        Signature:___________________________________________________    Title:   []  MD  []  PA  []  NP  []  CNS  []  RN  [x]  SW  [x]  Discharge Planner

## 2015-03-19 NOTE — Progress Notes (Signed)
Hospital Medicine Attending Progress Note  LOS: 3 days     Any Significant 24 Hour Events:     No acute events.   Subjective:                                         Doing well today. Feels well. No abdominal pain, nausea, vomiting. No chest pain, SOB. No fevers or chills. Wants to go home. No dysuria. No other complaints. Moving bowels.    Objective:                                 Physical Exam  BP 116/80 mmHg   Pulse 79   Temp(Src) 36.5 C (97.7 F) (Oral)   Resp 18   Ht 1.651 m (5\' 5" )   Wt 61.236 kg (135 lb)   BMI 22.47 kg/m2   SpO2 99%     General Constitutional: sitting up in bed, NAD  HEENT: MMM, anicteric. Prior tracheostomy scar.   Cardiovascular:  RRR, no murmurs  Pulmonary:  Normal WOB, CTAB  Gastrointestinal: BS normal. Soft, non tender, non distended. urostomy in place on right, clear yellow urine in bag.   Muskuloskeletal:  No edema. WWP. Contracted legs.   Skin:  No rashes noted. 1.5 cm eschar on right toe, non infected appearing.   Neurologic:  quadriplegic. Speech wnl.     Recent Lab, Micro, and Imaging Studies  Personally reviewed and notable for: K 3.7. Cr 0.43. Ca 8.8. WBC 5.4. HCT 36, plts 61.     CT abdomen/pelvis: 1. Nonobstructing left renal calculus. 2. Small abscess in the left buttock. 3. Right ovarian cyst. Six week follow up with ultrasound is recommended.     UC:  E coli >100K and Proteus 50 K, both sensitive to bactrim.   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   Plans:     Sepsis from urinary source due to E coli and possibly Proteus: UC with e coli (>100K) and proteus (50K). Sensitivities returned, will treat with bactrim DS BID for 7 more days starting tomorrow. No pyleo noted on CT, just a known stone.   -change antibiotics to Bacrtim DS 1 tab BID for 7 more days starting  tomorrow, total 10 day course.   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 Small abscess in left buttock as well.   -Ibuprofen 800 mg TID prn for pain    Small left buttock abscess: Seen on CT, asymptomatic. 3 days ceftriaxone here.   -bactrim as above, for 7 more days   -monitor  -may need drainage in the future, but small on exam.     Right Ovarian Cyst: seen on CT.   -[place] follow up in 6 weeks recommended.     Chronic Constipation:  -reported diarrhea prior to admission   -continue home suppository Q48H     Chronic Thrombocytopenia: no heparinoids.   -monitor on daily CBC    Macrocytic Anemia: Folate and B12 normal.   -HCT stable   -CBC  daily to monitor     F: No IVF  E: daily  N: regular diet   Prophylaxis: IPCs  Disposition: home today.    Full Code    Discussed with Cindy Duncans, PA. Updated patient's mother at bedside.     I completed the narrative discharge summary prior to discharge today.     Author: Charmian Muff, MD, Attending Hospitalist, on 03/19/2015 at 11:22 AM. Pager: [phone]

## 2015-03-19 NOTE — Progress Notes (Signed)
SW notified that patient would be discharged home today. SW met with patient and discussed discharge plans. Patient has aides through [facility], and she is responsible for setting aides back up at the time of discharge. Patient needs discharge transportation to get home. She has a ramp to get into her home. SW set up stretcher transportation for patient with [employer], for 2:30pm. SW obtained [employer] authorization for transportation via [employer].  SW to assist with any further needs prior to discharge.    Jannette Fogo Lorriane Dehart, LMSW  pager [phone]

## 2015-03-19 NOTE — Discharge Summary (Signed)
Name: Cindy Ochoa MRN: [id number] DOB: [birth year]     Admit Date: 03/16/2015   Date of Discharge: 03/19/2015    Patient was accepted for discharge to   Home or Self Care [1]           Discharge Attending Physician: Charmian Muff      Hospitalization Summary    CONCISE NARRATIVE: 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 She was initially started on Ceftriaxone initially. Cultures grew on E coli >100k and Proteus 50K. She was transitioned to PO Bactrim. She will complete a 10 day course (7 more days). A CT scan was done given hx of renal stones, and was negative for pyelonephritis, but did find a left non-obstructing renal stone (known) it also noted a small abscess in her left buttock (whcih is why bactrim is being continued for total 10 day course), which will need to be followed, and a right ovarian cyst, which should be followed up with [place] in 6 weeks. She was stable for discharge to home.       CT RESULTS:   CT scan of abdomen and pelvis:  1. Nonobstructing left renal calculus. 2. Small abscess in the left buttock. 3. Right ovarian cyst. Six week follow up with ultrasound is   recommended.     SIGNIFICANT MED CHANGES: Yes  Bactrim DS 1 tab BID for 7 more days starting 3/23.       Signed: Charmian Muff, MD  On: 03/19/2015  at: 3:02 PM

## 2015-03-19 NOTE — Progress Notes (Signed)
Finished discharge paperwork and brought into patient room to go over the details.  Patient and mother did not want to review the paperwork stating "we have done this many times".  Writer confirmed that there was only one medication changed and that it was at bedside.  Patients mother signed all paperwork and have belongings together.

## 2015-03-19 NOTE — Progress Notes (Signed)
HMD APP - PA    Assumed care of patient today.  Reviewed chart.  Discussed patient with Attending MD Dr. Judeth Horn.  Pt was planned to be discharged to home today.  Rx for Bactrim DS was sent electronically to the [facility].  Discussed patient also with patient's nurse Beth who was aware of plan for discharge today.    Please refer to Dr. Wynetta Emery note for further details.    Marge Duncans, PA-C

## 2015-03-20 ENCOUNTER — Telehealth: Payer: Self-pay | Admitting: Internal Medicine

## 2015-03-20 LAB — AEROBIC CULTURE

## 2015-03-20 LAB — GRAM STAIN

## 2015-03-20 NOTE — Telephone Encounter (Signed)
Care Management Self-Management Assessment    Date of Discharge Call:  03/20/2015                     Patient Assessment:    Why were you in the hospital?   Vomiting, diarrhea, urinary infection    Please list several of your concerns following your discharge:  No questions, concerns    Do you have all of your medications?  yes    Are you on any anticoagulation medication?  no    Have you had any problems with your medications?  no    Do you have any physical limitations right now?  wheelchair    On a scale from 1 to 10 (with 10 being the most stressed your have ever felt in your life) what would you say is your current level of stress? 1    Who are the most important people in your life that provide you support?  Aides- consumer directed aide service 16 hour/day    Transportation plan:  [employer] transportation  SIM follow up appt scheduled for 03/22/15.

## 2015-03-20 NOTE — Telephone Encounter (Signed)
Review of AVS:    Date of Discharge:    03/19/15    Reason for admission:    Uti (Lower Urinary Tract Infection), Sepsis    Discharged to :     Home    On Coumadin or Warfarin:  no    Follow up appointment:    Follow up with [name], MD In 1 week.    01/14/2016 9:30 AM Alden Server, MD [facility] @ [facility]  [phone]    Medicare?  no

## 2015-03-22 ENCOUNTER — Ambulatory Visit: Payer: Self-pay | Admitting: Internal Medicine

## 2015-03-22 LAB — BLOOD CULTURE
Bacterial Blood Culture: 0
Bacterial Blood Culture: 0

## 2015-03-25 ENCOUNTER — Ambulatory Visit: Payer: Self-pay | Admitting: Internal Medicine

## 2015-03-25 ENCOUNTER — Telehealth: Payer: Self-pay | Admitting: Internal Medicine

## 2015-03-25 NOTE — Telephone Encounter (Signed)
Cindy Ochoa - do you want to put in referral?  Ortho?

## 2015-03-25 NOTE — Telephone Encounter (Signed)
Patient would like to know if she can have a referral to physical therapy. Patient has an appt on Thurs with [name] for a Hospital D/C f/u, but was hoping an evaluation can be done at the same time. Patient can be reached @ [phone] (H) to discuss.

## 2015-03-28 ENCOUNTER — Ambulatory Visit: Payer: Self-pay | Admitting: Internal Medicine

## 2015-03-28 ENCOUNTER — Telehealth: Payer: Self-pay | Admitting: Internal Medicine

## 2015-03-28 NOTE — Telephone Encounter (Signed)
I need to know why she needs a referral to know who to send her too  Will discuss at ov next week    Prashant Glosser A Dixon Boos, NP

## 2015-03-28 NOTE — Telephone Encounter (Signed)
Patient called to reschedule her hospital discharge appointment for today. Patient stated next week would be best in the morning.  Writer scheduled for 4.8 at 10:10.  She can be reached at [phone] with any questions.

## 2015-03-28 NOTE — Telephone Encounter (Signed)
She has cancel her appointment several times it is now next week.    [place], NP

## 2015-04-01 ENCOUNTER — Ambulatory Visit: Payer: Self-pay | Admitting: Wound

## 2015-04-02 ENCOUNTER — Telehealth: Payer: Self-pay | Admitting: Urology

## 2015-04-02 ENCOUNTER — Telehealth: Payer: Self-pay | Admitting: Internal Medicine

## 2015-04-02 NOTE — Telephone Encounter (Signed)
Patient is wondering what the "next step" is in treating her kidney stones.  Please contact her to advise at [phone].

## 2015-04-02 NOTE — Telephone Encounter (Signed)
She has medicaid  She needs transportation  She needs wheelchair Lucianne Lei to get here.  Do we need to complete form?    Lavone Orn

## 2015-04-02 NOTE — Telephone Encounter (Signed)
Looks like she is trying to cancel this Friday's appointment due to lack of transporation.  Does she qualify for transportation?  Does she need a wheelchair Cindy Ochoa?  I can try to call and arrange transportation with Medicaid.

## 2015-04-02 NOTE — Telephone Encounter (Signed)
I called medicaid transportation and they said the patient is already set up for transportation for this Friday.  I called the patient and she said that she does get her transporation set up, but then they never show up on the day of her appointment. I advised her to call the transportation number and let them know what has been doing on to see if they will change her vendor.  She should be all set for her appointment this Friday.

## 2015-04-02 NOTE — Telephone Encounter (Signed)
Patient calling to advise that she has had to miss many appointments due to transportation.  She states she needs to cancel this Friday's appointment.  Patient would like to know if she qualifies for a Education officer, museum to set up transportation for her.  She would like a call back at [phone].  Writer did not cancel the Friday appointment in case transportation can be arranged.

## 2015-04-03 ENCOUNTER — Other Ambulatory Visit: Payer: Self-pay | Admitting: Urology

## 2015-04-03 DIAGNOSIS — N200 Calculus of kidney: Secondary | ICD-10-CM

## 2015-04-03 MED ORDER — SENNOSIDES-DOCUSATE SODIUM 8.6-50 MG PO TABS *I*
1.0000 | ORAL_TABLET | Freq: Every day | ORAL | Status: DC | PRN
Start: 2015-04-03 — End: 2015-10-09

## 2015-04-03 MED ORDER — SENNOSIDES-DOCUSATE SODIUM 8.6-50 MG PO TABS *I*
1.0000 | ORAL_TABLET | Freq: Every day | ORAL | Status: DC | PRN
Start: 2015-04-03 — End: 2015-04-03

## 2015-04-03 NOTE — Telephone Encounter (Signed)
Reviewed CT scan with Dr.ERturk.     Stable nephrolithiasis.     Pt will follow up with KUB in 6 months withOV.     Pt complains of constipation. Will send in pericolace into pharmacy. Advised to follow up with PCP if constipation does not improve.     Renda - please call pt to schedule for OV and KUB in 6 months. KUB ordered.    Maida Sale, NP  04/03/2015  4:29 PM

## 2015-04-05 ENCOUNTER — Ambulatory Visit: Payer: Self-pay | Admitting: Internal Medicine

## 2015-04-05 ENCOUNTER — Ambulatory Visit: Payer: Self-pay | Admitting: Bariatric Surgery

## 2015-04-09 ENCOUNTER — Telehealth: Payer: Self-pay | Admitting: Internal Medicine

## 2015-04-09 DIAGNOSIS — R29898 Other symptoms and signs involving the musculoskeletal system: Secondary | ICD-10-CM

## 2015-04-09 NOTE — Telephone Encounter (Signed)
Hinton Dyer, from [place], called requesting a referral for physical therapy on the patients behalf   Caller states the patient is requesting this referral in order to get a shower chair that would better accommodate her needs that can only be prescribed by a physical therapist     Any questions or concerns please call [phone]

## 2015-04-09 NOTE — Telephone Encounter (Signed)
Hello Dr. Phillis Haggis needs a PT referral so that she is able to maneuver better in the shower.    I started the referral in Meds & Orders. Please review and sign if appropriate, or advise.    Thank you

## 2015-04-09 NOTE — Telephone Encounter (Signed)
Called pt several times and unable to reach by phone.  [employer] forbids leaving message or calling alternate number.  Sent patient letter to call for 6 month f/u with [facility] 1st.

## 2015-04-11 ENCOUNTER — Other Ambulatory Visit: Payer: Self-pay | Admitting: Internal Medicine

## 2015-04-11 DIAGNOSIS — G8250 Quadriplegia, unspecified: Secondary | ICD-10-CM

## 2015-04-11 DIAGNOSIS — G8254 Quadriplegia, C5-C7 incomplete: Secondary | ICD-10-CM

## 2015-04-11 DIAGNOSIS — N319 Neuromuscular dysfunction of bladder, unspecified: Secondary | ICD-10-CM

## 2015-04-11 NOTE — Telephone Encounter (Signed)
Order for PT placed.

## 2015-04-11 NOTE — Telephone Encounter (Signed)
Requested Prescriptions     Pending Prescriptions Disp Refills    incontinence supply disposable 150 each 5     Sig: Large pull ups - use up to 5 x daily  Dx N39.42    Non-System Medication 10 each 5     Sig: Urostomy drainage bags change as needed. Dx N39.42 and  G82.54    disposable gloves 200 each 6     Sig: 2 boxes Disposable Medium size gloves    disposable underpads 30"x36" (CHUX) 150 each 5     Sig: Use 6 times daily and PRN. 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

## 2015-04-12 MED ORDER — DISPOSABLE GLOVES MISC *A*
Status: DC
Start: 2015-04-12 — End: 2015-05-02

## 2015-04-12 MED ORDER — DISPOSABLE UNDERPADS 30"X36" MISC *A*
Status: DC
Start: 2015-04-12 — End: 2015-04-30

## 2015-04-12 MED ORDER — NON-SYSTEM MEDICATION *A*
Status: DC
Start: 2015-04-12 — End: 2015-05-02

## 2015-04-12 MED ORDER — INCONTINENCE SUPPLY DISPOSABLE MISC *A*
Status: DC
Start: 2015-04-12 — End: 2015-05-08

## 2015-04-12 NOTE — Telephone Encounter (Signed)
Refilled.  Please call patient if necessary.

## 2015-04-15 ENCOUNTER — Telehealth: Payer: Self-pay | Admitting: Urology

## 2015-04-15 NOTE — Telephone Encounter (Signed)
Dayna, from [place] called back stating that the referral for PT is for home care physical therapy, therefore the referral in chart is not going to work.   Dayna states the patient is a quadriplegic therefore she needs home care.  Dayna also states the patient needs a shower chair that will have a neck support.  Please call Dayna back to advise @ [phone]

## 2015-04-15 NOTE — Telephone Encounter (Signed)
Patient is calling per closed encounter on 4.5.16, and writer booked for 10.13.16 with Dr. Jorene Guest. Writer advised of KUB prior. Please call her at [phone] if needed.

## 2015-04-15 NOTE — Telephone Encounter (Addendum)
Left message for [name] inquiring about shower chair with neck support.  Patient received a shower chair last month from them - script stated patient was a quad. Tried to contact Dayna but no answer and no voice mail.

## 2015-04-17 NOTE — Telephone Encounter (Signed)
Spoke with Deena at [facility] intake.  Referral to [facility] for home safety eval and short term PT referral to help with maneuvering in the shower with new chair.   [facility] willing to open patient and plans on opening this week by Friday 04/19/15

## 2015-04-18 ENCOUNTER — Telehealth: Payer: Self-pay | Admitting: Internal Medicine

## 2015-04-18 NOTE — Telephone Encounter (Signed)
Santiago Glad calling with [facility] to speak with Silva Bandy regarding Cindy Ochoa. Please call her back at [phone].

## 2015-04-18 NOTE — Telephone Encounter (Signed)
Spoke with Santiago Glad about the status of patient. Apparently she is living in a new home in [place].     She states before they go out to see patient they need to know if the patient has any support system in her new home.    Does she have a primary caregiver?    Is she involved with other agencies besides [facility]?

## 2015-04-19 NOTE — Telephone Encounter (Signed)
Patient had aide service Monday through [phone]    Aides stay 16 hours, some stay 12.  Patient has 129 hours of aide service. She states that an aide is always with her.     Patient now lives [place] in her own home. There is a ramp to get out of her home.      Patient states she has [employer].  They are the agency that supplies patient aides.      Patient was upset with Probation officer, asking why this personal information is needed.      She will be having a PT eval.  [facility] will not get involved with the aide service. Patient needs assistance with transfers in bathroom and ADLSs.     Patient needs to have PT and get oriented to her new surroundings,    Raford Pitcher explained that [facility] has had issues with the patient being non-compliant and fear that it wasn't safe to go in the home.    Raford Pitcher stated that [facility] would contact patient and schedule a visit.      Will route to Dr. Modesta Messing as Juluis Rainier.

## 2015-04-23 ENCOUNTER — Telehealth: Payer: Self-pay | Admitting: Internal Medicine

## 2015-04-23 NOTE — Telephone Encounter (Signed)
Mikki Santee called from [facility] w/FYI that services are being continues.  If any questions Mikki Santee can be reached @ [phone]

## 2015-04-30 ENCOUNTER — Telehealth: Payer: Self-pay | Admitting: Internal Medicine

## 2015-04-30 DIAGNOSIS — G8254 Quadriplegia, C5-C7 incomplete: Secondary | ICD-10-CM

## 2015-04-30 DIAGNOSIS — G8250 Quadriplegia, unspecified: Secondary | ICD-10-CM

## 2015-04-30 DIAGNOSIS — N319 Neuromuscular dysfunction of bladder, unspecified: Secondary | ICD-10-CM

## 2015-04-30 MED ORDER — DISPOSABLE UNDERPADS 30"X36" MISC *A*
Status: DC
Start: 2015-04-30 — End: 2015-12-11

## 2015-04-30 NOTE — Telephone Encounter (Signed)
Patient requesting call to discuss status of Chux script, please call [phone]

## 2015-04-30 NOTE — Telephone Encounter (Signed)
Per [name] they did not receive any of the scripts Dr. Modesta Messing did on 4/15

## 2015-05-02 ENCOUNTER — Other Ambulatory Visit: Payer: Self-pay | Admitting: Internal Medicine

## 2015-05-02 DIAGNOSIS — N319 Neuromuscular dysfunction of bladder, unspecified: Secondary | ICD-10-CM

## 2015-05-02 DIAGNOSIS — G8250 Quadriplegia, unspecified: Secondary | ICD-10-CM

## 2015-05-02 DIAGNOSIS — G8254 Quadriplegia, C5-C7 incomplete: Secondary | ICD-10-CM

## 2015-05-02 MED ORDER — NON-SYSTEM MEDICATION *A*
Status: DC
Start: 2015-05-02 — End: 2015-05-08

## 2015-05-02 MED ORDER — DISPOSABLE GLOVES MISC *A*
Status: DC
Start: 2015-05-02 — End: 2015-05-02

## 2015-05-02 MED ORDER — DISPOSABLE GLOVES MISC *A*
Status: DC
Start: 2015-05-02 — End: 2015-05-06

## 2015-05-02 NOTE — Telephone Encounter (Signed)
Cindy Ochoa's received the Chux but not the urostomy bags and gloves

## 2015-05-03 ENCOUNTER — Telehealth: Payer: Self-pay | Admitting: Internal Medicine

## 2015-05-03 DIAGNOSIS — N319 Neuromuscular dysfunction of bladder, unspecified: Secondary | ICD-10-CM

## 2015-05-03 DIAGNOSIS — G8254 Quadriplegia, C5-C7 incomplete: Secondary | ICD-10-CM

## 2015-05-03 NOTE — Telephone Encounter (Signed)
Dyann Ruddle calling with [facility] to speak with Lavone Orn.  She states it is in regards to the order for patient's hospital bed.  Please call her back at [phone]

## 2015-05-06 ENCOUNTER — Other Ambulatory Visit: Payer: Self-pay | Admitting: Internal Medicine

## 2015-05-06 ENCOUNTER — Telehealth: Payer: Self-pay | Admitting: Urology

## 2015-05-06 DIAGNOSIS — G8254 Quadriplegia, C5-C7 incomplete: Secondary | ICD-10-CM

## 2015-05-06 DIAGNOSIS — N319 Neuromuscular dysfunction of bladder, unspecified: Secondary | ICD-10-CM

## 2015-05-06 MED ORDER — DISPOSABLE GLOVES MISC *A*
Status: DC
Start: 2015-05-06 — End: 2017-01-18

## 2015-05-06 MED ORDER — DISPOSABLE GLOVES MISC *A*
Status: DC
Start: 2015-05-06 — End: 2015-12-11

## 2015-05-06 MED ORDER — SULFAMETHOXAZOLE-TRIMETHOPRIM 800-160 MG PO TABS *I*
1.0000 | ORAL_TABLET | Freq: Two times a day (BID) | ORAL | Status: DC
Start: 2015-05-06 — End: 2015-07-26

## 2015-05-06 NOTE — Telephone Encounter (Signed)
Eviana Prosise calling to request prescription(s)   Requested Prescriptions     Pending Prescriptions Disp Refills    disposable gloves 200 each 6     Sig: 2 boxes Disposable Medium size gloves   disposable underpads 30"x36" (CHUX)  Pull ups  Drainage bags     to be sent to [facility].    Is patient out of the medication? yes  Does the patient have questions regarding the medication for the nurse? no    Patient is requesting a call back at [phone].

## 2015-05-06 NOTE — Telephone Encounter (Signed)
Cindy Ochoa is not in today.     Thinks needs to have paperwork completed    I will send note to secretary to see if we have any paperwork that needs to be completed.    [place], NP

## 2015-05-06 NOTE — Telephone Encounter (Signed)
pls send patient prescription to pharmacy listed, prescription was sent to the wrong location.  Thank you

## 2015-05-06 NOTE — Telephone Encounter (Signed)
Script sent to [facility]. Cindy Ochoa.

## 2015-05-06 NOTE — Telephone Encounter (Signed)
Refilled.  Please call patient to inform.

## 2015-05-06 NOTE — Telephone Encounter (Signed)
CT in 02/2015 showed - Nonobstructing left renal calculus measuring 10 mm x 4 mm. Right kidney is unremarkable. No hydronephrosis bilaterally.  Patient called today - symptomatic UTI. Has urostomy.     Plz notify patient-   Called-in Bactrim for possible UTI.   Follow up with Dr. Jorene Guest in 1-2 month for frequent UTI in the setting of 1 cm left renal stone.     Thanks,  Judson Roch

## 2015-05-06 NOTE — Telephone Encounter (Signed)
Called the patient and left a message on voice mail requesting a return call to the [facility] office. No urology HIPAA on file. Please WARM TRANSFER to the [facility] office and review the message below from Helane Rima, NP

## 2015-05-06 NOTE — Telephone Encounter (Signed)
Patient has a kidney stone and over the weekend she started having constant spasms. Discomfort is about a 8 out of 10. She noticed that her urine is dark and cloudy. Patient has been taking ibprofen but is not helping. Please advise and call patient. Patient will be with her aide and will be available after 10:30.

## 2015-05-06 NOTE — Telephone Encounter (Addendum)
Cindy Ochoa,  Patient seen by Dr. Jorene Guest on 02/21/15. Please read the message below and advise.    Plan:  1. KUB reviewed - Possible small left renal stones. Because of discrepancy between renal US and KUB, pt will be further evaluated with CT scan. Pt agreeable.  3. Pt can return to Dr. [name] for intermittent urostomy care.   4. Advised pt that he needed to call Dr. Gilda Crease office or go the ED for any of the following symptoms: fever greater than 101F, uncontrolled pain, unable to urinate, hematuria. No further questions or concerns at this time. Pt understood directions.

## 2015-05-07 NOTE — Telephone Encounter (Signed)
Cindy Ochoa will fax paperwork directly

## 2015-05-08 ENCOUNTER — Other Ambulatory Visit: Payer: Self-pay | Admitting: Internal Medicine

## 2015-05-08 DIAGNOSIS — G8250 Quadriplegia, unspecified: Secondary | ICD-10-CM

## 2015-05-08 MED ORDER — INCONTINENCE SUPPLY DISPOSABLE MISC *A*
Status: DC
Start: 2015-05-08 — End: 2015-12-11

## 2015-05-08 MED ORDER — NON-SYSTEM MEDICATION *A*
Status: DC
Start: 2015-05-08 — End: 2015-10-28

## 2015-05-08 NOTE — Telephone Encounter (Signed)
Agter reviewing form [name] sent patient must be physically evaluated regarding all the points that must be covered in order for the bed to be approved through her insurance. Patient and Cindy Ochoa notified that this will be done at office visit June 9.

## 2015-05-08 NOTE — Telephone Encounter (Signed)
Cindy Ochoa requesting documentation to have Medicaid pay for the repairs to the hospital bed that was done 5 months ago.

## 2015-05-08 NOTE — Telephone Encounter (Signed)
Scripts were sent to [facility] 5/9 for all but the drainage bags which did not print. Will refax chux, gloves, briefs and have [name] resend the bags.

## 2015-05-08 NOTE — Telephone Encounter (Signed)
Script signed and placed in my folder.    [name], MD

## 2015-05-08 NOTE — Telephone Encounter (Signed)
Patient is calling back in regards to message below. Patient states she does not need a hospital bed because she already has one. Patient states she is in need of her under pads/Chux, drainage bags, gloves (medium), and pull up briefs (large). She needs these sent to [facility] asap because she is completley out. Patient said she spoke with [facility] and they haven't received any orders and requested we send new scripts.     Please call patient back at [phone] with an update.

## 2015-05-30 ENCOUNTER — Encounter: Payer: Self-pay | Admitting: Internal Medicine

## 2015-06-05 NOTE — Telephone Encounter (Signed)
Faxed documentation as requested

## 2015-06-06 ENCOUNTER — Ambulatory Visit: Payer: Self-pay | Admitting: Student in an Organized Health Care Education/Training Program

## 2015-06-06 ENCOUNTER — Other Ambulatory Visit: Payer: Self-pay | Admitting: Internal Medicine

## 2015-06-06 ENCOUNTER — Encounter: Payer: Self-pay | Admitting: Gastroenterology

## 2015-06-06 NOTE — Telephone Encounter (Signed)
Refilled.  Please inform patient if necessary.

## 2015-06-07 ENCOUNTER — Ambulatory Visit: Payer: Self-pay | Admitting: Obstetrics and Gynecology

## 2015-06-11 ENCOUNTER — Ambulatory Visit: Payer: Self-pay | Admitting: Student in an Organized Health Care Education/Training Program

## 2015-06-12 ENCOUNTER — Emergency Department
Admission: EM | Admit: 2015-06-12 | Disposition: A | Discharge status: Home / Self Care | Payer: Self-pay | Source: Ambulatory Visit | Attending: Emergency Medicine | Admitting: Emergency Medicine

## 2015-06-12 ENCOUNTER — Encounter: Payer: Self-pay | Admitting: Anesthesiology

## 2015-06-12 ENCOUNTER — Ambulatory Visit: Payer: Self-pay | Admitting: Internal Medicine

## 2015-06-12 LAB — CBC AND DIFFERENTIAL
Baso # K/uL: 0 10*3/uL (ref 0.0–0.1)
Basophil %: 0.3 %
Eos # K/uL: 0.1 10*3/uL (ref 0.0–0.4)
Eosinophil %: 1.5 %
Hematocrit: 41 % (ref 34–45)
Hemoglobin: 13.5 g/dL (ref 11.2–15.7)
IMM Granulocytes #: 0 10*3/uL (ref 0.0–0.1)
IMM Granulocytes: 0.4 %
Lymph # K/uL: 2.6 10*3/uL (ref 1.2–3.7)
Lymphocyte %: 38.4 %
MCH: 31 pg/cell (ref 26–32)
MCHC: 33 g/dL (ref 32–36)
MCV: 96 fL — ABNORMAL HIGH (ref 79–95)
Mono # K/uL: 0.4 10*3/uL (ref 0.2–0.9)
Monocyte %: 6.1 %
Neut # K/uL: 3.7 10*3/uL (ref 1.6–6.1)
Nucl RBC # K/uL: 0 10*3/uL (ref 0.0–0.0)
Nucl RBC %: 0 /100 WBC (ref 0.0–0.2)
RBC: 4.3 MIL/uL (ref 3.9–5.2)
RDW: 13.9 % (ref 11.7–14.4)
Seg Neut %: 53.3 %
WBC: 6.9 10*3/uL (ref 4.0–10.0)

## 2015-06-12 LAB — URINALYSIS REFLEX TO CULTURE
Blood,UA: NEGATIVE
Ketones, UA: NEGATIVE
Nitrite,UA: NEGATIVE
Protein,UA: >=500 mg/dL — AB
Specific Gravity,UA: 1.016 (ref 1.002–1.030)
pH,UA: 9 — ABNORMAL HIGH (ref 5.0–8.0)

## 2015-06-12 LAB — PLASMA PROF 7 (ED ONLY)
Anion Gap,PL: 15 (ref 7–16)
CO2,Plasma: 22 mmol/L (ref 20–28)
Chloride,Plasma: 106 mmol/L (ref 96–108)
Creatinine: 0.45 mg/dL — ABNORMAL LOW (ref 0.51–0.95)
GFR,Black: 156 *
GFR,Caucasian: 135 *
Glucose,Plasma: 100 mg/dL — ABNORMAL HIGH (ref 60–99)
Potassium,Plasma: 3.9 mmol/L (ref 3.4–4.7)
Sodium,Plasma: 143 mmol/L (ref 132–146)
UN,Plasma: 12 mg/dL (ref 6–20)

## 2015-06-12 LAB — URINE MICROSCOPIC (IQ200)
RBC,UA: 4 /hpf — AB (ref 0–2)
WBC,UA: 141 /hpf — ABNORMAL HIGH (ref 0–5)

## 2015-06-12 LAB — HOLD SST

## 2015-06-12 MED ORDER — GAUZE PADS & DRESSINGS 2"X2" PADS *A*
MEDICATED_PAD | 0 refills | Status: AC
Start: 2015-06-12 — End: 2015-07-12

## 2015-06-12 MED ORDER — SODIUM CHLORIDE 0.9 % IV BOLUS *I*
500.0000 mL | Freq: Once | Status: DC
Start: 2015-06-12 — End: 2015-06-12

## 2015-06-12 MED ORDER — CEFTRIAXONE IN LIDOCAINE 1% 350 MG/ML IM *I*
1000.0000 mg | Freq: Once | INTRAMUSCULAR | Status: AC
Start: 2015-06-12 — End: 2015-06-12
  Administered 2015-06-12: 1000 mg via INTRAMUSCULAR
  Filled 2015-06-12 (×2): qty 2.86

## 2015-06-12 MED ORDER — CEPHALEXIN 500 MG PO CAPS *I*
500.0000 mg | ORAL_CAPSULE | Freq: Two times a day (BID) | ORAL | 0 refills | Status: DC
Start: 2015-06-12 — End: 2015-06-14

## 2015-06-12 MED ORDER — ADHESIVE BANDAGES CLEAR MISC
0 refills | Status: DC
Start: 2015-06-12 — End: 2015-07-05

## 2015-06-12 NOTE — Discharge Instructions (Signed)
You came to the ED due to fever, chills, and not feeling well. You were concerned that your chronic ulcers were infected. The ulcers were seen today, and are not draining any fluid, and are dry. They require the same dressings you were doing before.   Your urine results appeared positive for a urinary tract infection, which you received one dose of antibiotics for while you were in the hospital. You will be going home with a course of oral antibiotics which you should finish.

## 2015-06-12 NOTE — ED Provider Progress Notes (Signed)
ED Provider Progress Note      UA appears grossly positive.   Patient amenable to being treated for UTI.      Danton Clap, MD, 06/12/2015, 2:38 PM         Danton Clap, MD  Resident  06/12/15 1440
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
## 2015-06-12 NOTE — Progress Notes (Signed)
[facility]  Transportation Request Form / Physician Certification Statement   Please fax request with face sheet to [facility] Coordinator: Fax [phone], Phone [phone] or [facility] [place] [facility]: Fax [phone], Phone [phone]    Patient Name:  Cindy Ochoa     Date of Birth:  14-Jan-1986  Date of Service: June 12, 2015  Requested time of pick up: ASAP  Patient Location:  AC06/AC-06R     MR#  [id number]  Patient Destination: [address], [place] [address]    Number of steps into house?: ramp  Requestors Name: Lucius Conn, LMSW Call Back [phone] Payor : Medicaid    Transport for (check what type of treatment or service, at least one):    [x]  Discharge              []  Diagnostic Test      []  Evaluation Test         []  Procedure      Specify what type of treatment or service: home   Is this treatment or service available at sending facility?:      []  Yes    [x]  No    Requested Mode of Transport  []  One man Chairmobile** []  Two man Chairmobile**  (** See instruction  form)  Leg extension required:     []  Yes  []  No  Patient has own chair:  []  Yes    []  No   Wheelchair size:         []  Standard []  X-wide  []  XX-wide  []  Round Trip []  One Way  [x]  Stretcher Lucianne Lei (no medical attention needed)    []  [facility]  []  [facility]  VENDOR: _[place]________   1. Medical condition that necessitates this mode of transport (i.e. oxygen, bed ridden, etc.):  Pt is quadriplegic, does not have her specialized wheelchair here with her, cannot sit up in standard wheelchair    2. What medical services are to be provided by crew?   []  Oxygen Monitoring:  Amount:      LPM  Can patient self-administer oxygen?  []  Yes []  No - What is limitation?        Does patient have own portable tank?    []  Yes     []  No   []  Airway Monitoring []  Monitor IV infusion []  Suctioning     []  Cardiac Monitoring []  Vital Signs  []  PRN Meds  []  Other_______________________________  [x]  None    3. Infection  control needs (i.e. ORSA/VRE/Cdiff)       []  Yes    [x]  No    4. What specific handling is required?    []  Positioning []  Orthopedic Device  []  Restraints []  Other:        Reason for above:  []  Flight Risk     []  Severe Pain []  Morbid Obesity       []  Fall Precaution   []  Ortho/Spine Precaution         []  Decubitis > Stage 2 []  Risk of injury to self/others    5.  Patient mental status? [x]  Normal cognition []  Disoriented      Specify:        []  Psychological Disorder Specify:       6. At time of transport is bed confinement ordered?  []  Yes     [x]  No              Is patient bed confined?    []   Yes     [x]  No    Medical condition for bed confinement:     Social work name:  Lucius Conn, LMSW         Date:  June 12, 2015        Signature:___________________________________________________    Title:   []  MD  []  PA  []  NP  []  CNS  []  RN  [x]  SW  [x]  Discharge Planner

## 2015-06-12 NOTE — Progress Notes (Signed)
Patient DOB:  [id number]    Patient Address:  [address]  [place] [address]    Patient Phone:  [phone] (home)     Patient Insurance:  Bonnita Nasuti,    [place],    [id number]            Patient PCP:  [name], MD    Patient Appointment Calendar  [email or web address] @    Intervention Status:  DSRIP Intervention status: Initial intervention  DSRIP Patient?: Yes  Patient address confirmation: Yes  Patient phone confirmation: Yes  Patient insurance confirmation: Yes  Patient [facility] referred?: No  Patient PCP confirmation: Yes  Patient followup provider: [name]  DSRIP Patient followup appointment date: 06/19/15  Patient followup appointment time: 1400  Patient followup appointment center: [facility]  Patient followup appointment address: [place] [address]  Patient transport plan: self  CHW handoff to involved agency?: No  Time Spent with Patient (min): [place]  [phone]

## 2015-06-12 NOTE — ED Triage Notes (Signed)
Triage Note  Increase with bed sores and has had chill and thinks she had a fever. Has h/o infected bed sores      Clovis Cao, RN

## 2015-06-12 NOTE — ED Notes (Signed)
Pt states she has been feeling "not herself for the last couple days". States aide noticed 3 days ago some greenish-yellow d/c from sacral ulcer. Pt has no sensation from chest down, is diaphoretic and decreased PO intake. Pt states her home health aide has "called in" a few times and she has not been repositioned as she normally needs to be, and has also been unable to make appts with wound care and PCP.

## 2015-06-12 NOTE — ED Notes (Signed)
Pt tolerating PO. Pt d/c instructions reviewed, pt comfortable with d/c planning. Pt understands d/c instructions and will follow up with PCP. Pt belongings are with pt. Pt safe to d/c at this time. SW will be setting up transportation home.

## 2015-06-13 ENCOUNTER — Telehealth: Payer: Self-pay | Admitting: Urology

## 2015-06-13 NOTE — Telephone Encounter (Signed)
Patient called and stated that she went to the ED for a UTI and they prescribed her cethalexin 500 mg capsules and she stated that they make her vomit and would like to do if Dr. [name] could prescribe something different. Patient uses [facility] at [facility].    Patient can be reached at [phone].

## 2015-06-14 ENCOUNTER — Telehealth: Payer: Self-pay | Admitting: Family

## 2015-06-14 MED ORDER — CEPHALEXIN 250 MG/5ML PO SUSR *I*
500.0000 mg | Freq: Four times a day (QID) | ORAL | 0 refills | Status: DC
Start: 2015-06-14 — End: 2015-07-26

## 2015-06-14 NOTE — Telephone Encounter (Signed)
Patients mother calling on her behalf. She stated her daughter has not slept all night. Patient called yesterday (see previous encounter) about the Kelflex capsules making her sick. Patients mother stated she cannot take capsules and something else needs to be prescribed for her. She stated her daughter is paralyzed and is having spasms and a very hard time since there is no medicine in her system to treat the infection. She uses the [facility].

## 2015-06-14 NOTE — Telephone Encounter (Signed)
Please read the message below and advise. The 06/12/15 urine culture results have not been finalized yet.

## 2015-06-14 NOTE — Telephone Encounter (Signed)
Please advise patient. Keflex changed to liquid form.   Her cramping may associated with infection. Should take Keflex as soon as possible.

## 2015-06-14 NOTE — Telephone Encounter (Signed)
Please see notes: Cindy Ochoa is requesting a return call from Dr. [name]. She states this is regarding needing the medicine Keflex prescribed in another form besides capsules. Patient stated she cannot take capsules. Patient also stated she is a quadriplegic and is experiencing muscles spasms. She is requesting a call back at [phone].

## 2015-06-14 NOTE — Telephone Encounter (Signed)
msg left with below from Helane Rima NP. Ok to leave msg per HIPAA in [place].

## 2015-06-14 NOTE — Telephone Encounter (Signed)
Call back received from patient Cindy Ochoa, informed her that the prescription was changed to liquid.

## 2015-06-16 LAB — AEROBIC CULTURE

## 2015-06-17 ENCOUNTER — Telehealth: Payer: Self-pay | Admitting: Internal Medicine

## 2015-06-17 NOTE — Telephone Encounter (Signed)
Matt from the Emergency Dept. Here at [facility] called to report the patient's urine culture results from 06/15. Patient is growing 3 different organisms one in which is resistant to the anti-biotic keflex. May require a new anti-biotic script to be called in.  Catalina Antigua can be reached [phone] with any questions.

## 2015-06-17 NOTE — Telephone Encounter (Signed)
Message  Received: Today     [name], MD  Rob Hickman, RN Cc: Malinou, Roxanne Mins, MD     Caller: Unspecified (Today, 11:40 AM)                Cindy Ochoa went to the ED for sacral decubiti and they sent a urine culture. She has a suprapubic catheter in - so she will ALWAYS grow something. I don't know if she had a true UTI - or if her problem was secondary to the wound. Cindy Ochoa has an appt with Larkin Ina 6/22. Please call her and ask her how she is feeling - and remind her we would like to see her whether or not she is feeling better. If she is having fever, nausea/vomiting, she should go to the ED. Sweating is a chronic problem (autonomic dysfunction following C-spine injury), so please do not go by that symptom.      So, right now, no change in antibiotics to treat the urine culture.     Thanks!     PSM       Attempted to contact patient, detaled VM message left with appointment reminder with date and time.   Requested call back from patient if she cannot make it to the appointment.

## 2015-06-17 NOTE — Telephone Encounter (Signed)
Message routed to Dr Modesta Messing and Dr Hassell Done.

## 2015-06-19 ENCOUNTER — Ambulatory Visit: Payer: Self-pay | Admitting: Student in an Organized Health Care Education/Training Program

## 2015-06-19 ENCOUNTER — Encounter: Payer: Self-pay | Admitting: Student in an Organized Health Care Education/Training Program

## 2015-06-19 VITALS — BP 112/68 | HR 92 | Temp 95.0°F | Ht 65.0 in | Wt 135.0 lb

## 2015-06-19 DIAGNOSIS — Z993 Dependence on wheelchair: Secondary | ICD-10-CM

## 2015-06-19 DIAGNOSIS — G43829 Menstrual migraine, not intractable, without status migrainosus: Secondary | ICD-10-CM

## 2015-06-19 MED ORDER — ACETAMINOPHEN 500 MG PO TABS *I*
500.0000 mg | ORAL_TABLET | ORAL | 5 refills | Status: DC | PRN
Start: 2015-06-19 — End: 2015-06-19

## 2015-06-19 MED ORDER — NAPROXEN SODIUM 220 MG PO CAPS *A*
220.0000 mg | ORAL_CAPSULE | Freq: Three times a day (TID) | ORAL | 1 refills | Status: DC | PRN
Start: 2015-06-19 — End: 2016-01-29

## 2015-06-19 NOTE — Progress Notes (Addendum)
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  She has missed some of her appointments because of issues with nursing aid not showing up.  She reports having headaches, which always occur around the time of her menstrual cycle.  They last for a few days off and on and then disappear a day or two after her menstrual cycle is over.  She had been taking ibuprofen but it hasn't been working lately.  She was on Birth Control in the past but isn't now and doesn't want to take it anymore.  Additionally, she wants to visit her fiance in jail, states that she needs a letter documenting her condition to visit him, needs to include urosotomy bag and the reason that she is wheelchair bound.    Medications:     Current Outpatient Prescriptions on File Prior to Visit   Medication Sig Dispense Refill    cephalexin (KEFLEX) 250 MG/5ML suspension Take 10 mLs (500 mg total) by mouth 4 times daily 280 mL 0    Adhesive Bandages Clear MISC Adhesive bandages for dressing changes to ulcer. 30 each 0    gauze pads 2"X2" Use as needed 30 each 0    BISAC-EVAC 10 MG suppository PLACE 1 SUPPOSITORY RECTALLY DAILY AS NEEDED.  UNWRAP FIRST.  USE FOR CONSTIPATION. 12 suppository 0    Non-System Medication Urostomy drainage bags change as needed. Dx N39.42 and  G82.54 10 each 5    incontinence supply disposable Large pull ups - use up to 5 x daily  Dx N39.42 150 each 5    sulfamethoxazole-trimethoprim (BACTRIM DS,SEPTRA DS) 800-160 MG per tablet Take 1 tablet by mouth 2 times daily 14 tablet 0    sulfamethoxazole-trimethoprim (BACTRIM DS,SEPTRA DS) 800-160 MG per tablet Take 1 tablet by mouth 2 times daily 14 tablet 0    disposable gloves 2 boxes Disposable Medium size gloves 200 each 6    ibuprofen (ADVIL,MOTRIN) 800 MG  tablet take 1 tablet by mouth three times a day if needed for pain 50 tablet 2    disposable gloves 2 boxes Disposable Medium size gloves 200 each 6    disposable underpads 30"x36" (CHUX) Use 6 times daily and PRN. Dx N39.42  Incontinence without sensory awareness 150 each 5    senna-docusate (PERICOLACE) 8.6-50 MG per tablet Take 1 tablet by mouth daily as needed for Constipation 30 tablet 0    Non-System Medication Shower Chair - Quadriparesis G82.54 1 each 0    generic DME Use as directed. 1 each 0    Ostomy Supplies (HOLLISTER REPLACEMENT FILTERS) MISC [employer] Adapter (order # [id number]). 1 each 1    ARTIFICIAL TEARS 1.4 % ophthalmic solution PLACE 1 DROP INTO BOTH EYES FOUR TIMES DAILY 15 mL 10    Non-System Medication PRI AND SCREEN  DIAGNOSIS 344.04 Quadriparesis 1 each 0    white petrolatum-mineral oil (GENTEAL PM) 85-15 % ophthalmic ointment Place into both eyes nightly 3.5 mL 6    Wound Dressings (ALLEVYN AG SACRUM) PADS Apply 1 Units topically every other day 2 each 0    docusate sodium (DOK) 100 MG capsule Take 1 capsule (100 mg total) by mouth 2 times daily 60 capsule 11    Skin Protectants, Misc. OINT Apply 1 Tube topically 2 times  daily   1 Tube 11    Non-System Medication One Hoyer Lift  Diagnosis:  Quadriplegic ICD-9 344.04 1 each 0     No current facility-administered medications on file prior to visit.        Medications reviewed and reconciled.   Allergies:     Allergies   Allergen Reactions    Nitrofurantoin Nausea And Vomiting    Vancomycin Hives     hives 2006 but tolerated Rx in 2010  pt states she had vancomycin in 04/2012.  Feels this is not true allergy  as she has received it recently - had no reaction     Heparin Other (See Comments)     Thrombocytopenia;        Review of Systems:     Pertinent positives and negatives as per HPI.     Physical Exam:     Vitals:    06/19/15 1429   BP: 112/68   Pulse: 92   Temp: 35 C (95 F)   TempSrc: Temporal   Weight: 61.2 kg (135 lb)    Height: 1.651 m (5\' 5" )     Wt Readings from Last 3 Encounters:   06/19/15 61.2 kg (135 lb)   03/17/15 61.2 kg (135 lb)   02/21/15 60.3 kg (133 lb)     Body mass index is 22.47 kg/(m^2).  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    Headaches    - likely related to menstrual cycle  - will not continue Birth Control at this time, patient does not wish to take it anymore  - naproxen 220 mg q8h prn  - can use tylenol 500 mg q6h prn in addition to naproxen to control headaches    Paperwork/Social Work    - provided letter stating patient's medical necessity for wheelchair and urostomy while visiting Fiance in prison  - patient currently living at new home in [place] which is going well for her  - aid services for help with ADLs    RTC in 4 months for f/u    [name], MD   Internal Medicine, PGY-1    Attending Attestation   I have reviewed the above health history information and plan documented by Dr Modesta Messing.  In summary, this is a 29 y.o. female presenting for menstrual headaches and paperwork.  Agree with attempting OCPs though patient declines this for now.  Patient instead to continue NSAIDS.      I have discussed the case with the resident. I agree with their findings, assessment, and plan      Larinda Buttery, MD   Internal Medicine and Pediatrics   06/24/2015  1:16 PM

## 2015-06-21 ENCOUNTER — Ambulatory Visit: Payer: Self-pay | Admitting: Bariatric Surgery

## 2015-06-23 NOTE — Patient Instructions (Signed)
Please obtain naproxen from pharmacy for your headaches

## 2015-06-25 ENCOUNTER — Encounter: Payer: Self-pay | Admitting: Gastroenterology

## 2015-07-05 ENCOUNTER — Ambulatory Visit: Payer: Self-pay | Admitting: Bariatric Surgery

## 2015-07-05 ENCOUNTER — Encounter: Payer: Self-pay | Admitting: Bariatric Surgery

## 2015-07-05 VITALS — BP 94/55 | HR 105 | Temp 97.2°F | Resp 18 | Ht 65.0 in | Wt 135.0 lb

## 2015-07-05 DIAGNOSIS — L89104 Pressure ulcer of unspecified part of back, stage 4: Secondary | ICD-10-CM

## 2015-07-05 NOTE — Progress Notes (Addendum)
[facility]  Progress Note      Name: Cindy Ochoa, Cindy Ochoa  MRN: [id number]  DOB: [birth year]      Date of Encounter: 07/05/2015      Medical Providers    Referring: [name], MD   PCP: [name], MD       Chief Complaint      Chief Complaint   Patient presents with    Initial Evaluation     pressure ulcer          History of Present Ilness   [name] is a 29 y.o. female. She returns due to a recurrent sacral pressure ulcer.    She has a prior history of similar pressure ulcers that have been healed in the past with better moisture control and off-loading.    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      Allergies   She is allergic to nitrofurantoin; vancomycin; and heparin.      Medications  Current Outpatient Prescriptions   Medication Sig    lidocaine (XYLOCAINE) 2 % jelly Apply topically as needed for Pain (PRN @@ wound clinic)    naproxen (ALEVE) 220 MG capsule Take 1 capsule (220 mg total) by mouth every 8 hours as needed for  Headaches    cephalexin (KEFLEX) 250 MG/5ML suspension Take 10 mLs (500 mg total) by mouth 4 times daily    BISAC-EVAC 10 MG suppository PLACE 1 SUPPOSITORY RECTALLY DAILY AS NEEDED.  UNWRAP FIRST.  USE FOR CONSTIPATION.    Non-System Medication Urostomy drainage bags change as needed. Dx N39.42 and  G82.54    incontinence supply disposable Large pull ups - use up to 5 x daily  Dx N39.42    sulfamethoxazole-trimethoprim (BACTRIM DS,SEPTRA DS) 800-160 MG per tablet Take 1 tablet by mouth 2 times daily    sulfamethoxazole-trimethoprim (BACTRIM DS,SEPTRA DS) 800-160 MG per tablet Take 1 tablet by mouth 2 times daily    disposable gloves 2 boxes Disposable Medium size gloves    disposable gloves 2 boxes Disposable Medium size gloves    disposable underpads 30"x36" (CHUX) Use 6 times daily and PRN. Dx N39.42  Incontinence without sensory awareness    senna-docusate (PERICOLACE) 8.6-50 MG per tablet Take 1 tablet by mouth daily as needed for Constipation    Non-System Medication Shower Chair - Quadriparesis G82.54    Ostomy Supplies (HOLLISTER REPLACEMENT FILTERS) [place] (order # [phone]).    ARTIFICIAL TEARS 1.4 % ophthalmic solution PLACE 1 DROP INTO BOTH EYES FOUR TIMES DAILY    white petrolatum-mineral oil (GENTEAL PM) 85-15 % ophthalmic ointment Place into both eyes nightly    docusate sodium (DOK) 100 MG capsule Take 1 capsule (100 mg total) by mouth  2 times daily    Skin Protectants, Misc. OINT Apply 1 Tube topically 2 times daily      Non-System Medication One Hoyer Lift  Diagnosis:  Quadriplegic ICD-9 344.04    gauze pads 2"X2" Use as needed    Non-System Medication PRI AND SCREEN  DIAGNOSIS 344.04 Quadriparesis     No current facility-administered medications for this visit.          Review of Systems   Unchanged from prior visit.    Past Surgical History   She has a past surgical history that includes Multiple injuries (04/29/2005 ); craniotomy (04/29/2005); Cervical spine  surgery (04/29/2005); Cervical spine surgery (05/04/2005); Tracheostomy tube placement (05/15/05); Gastrostomy tube placement (05/15/05); IVC filter (May 2006 ); Urethral plication (16/09/9603); Left Tibia fracture (06/01/07); PICC insertion > 5 years (08/27/2012); and ileal loop urinary diversion (08/26/2012 ).      Social History   She reports that she quit smoking about 3 years ago. Her smoking use included Cigarettes. She started smoking about 3 years ago. She has a 1.50 pack-year smoking history. She quit smokeless tobacco use about 10 years ago. She reports that she uses illicit drugs, including Marijuana. She reports that she does not drink alcohol.      Family History   Her family history includes Breast cancer in an other family member; Cancer in an other family member; Diabetes in her maternal grandmother and mother; High cholesterol in her mother; Hypertension in an other family member; Osteoarthritis in her maternal grandmother; Stroke in her maternal grandfather. There is no history of Colon cancer or Thrombosis.      Vital Signs  Vitals:    07/05/15 1257   BP: 94/55   Pulse: 105   Resp: 18   Temp: 36.2 C (97.2 F)   Weight: 61.2 kg (135 lb)   Height: 1.651 m (5\' 5" )         Physical Exam   General appearance: alert, well appearing, and in no distress, oriented to person, place, and time, acyanotic, in no respiratory distress and well hydrated.     Extremities: extremities normal, atraumatic, no cyanosis or edema. pulses not examined.   Neurologic: sensation not intact in feet       Wound #6   Location: Right ischium   Well healed     Wound #7   Location: sacrum     Pre-debridement size: 1.5 cm (length), 1.0 cm (width), 2.5 cm (depth)   Post-debridement size: 1.5 cm (length), 1.0 cm (width), 3.0 cm (depth)   Tunneling: yes   Undermining: yes   Drainage: large.   Odor: no   Obvious infection: no   Edema: none   Tissue composition: granulation   Surrounding skin: healthy   Wound debrided today: yes, see  procedure note.    Procedure Note:    Patient was sterile prepped and draped as appropriate.    Topical/Local anesthetic:Yes    Debridement level:The wound was debrided of devitalized tissue to fascia level with a a surgical curette. Total area debrided: 3 cm sq  Good hemostasis obtained. Patient tolerated procedure well.        Assessment   Ms. Coker is a 29 y.o. female with a recurrent Stage 4 pressure ulcer. S/p ileal loop diversion procedure for  urinary drainage.     Plan       Local wound care: Guaze packing daily and  skin barrier cream and better offloading to protect from further breakdown.  Follow up 2 weeks.

## 2015-07-05 NOTE — Patient Instructions (Addendum)
Please follow these instructions about how to care for your wound.            Wound Location Dressing Orders Frequency of Dressing Change   Location: Sacrum Skin prep, Dry gauze packing, Calmoseptine, gauze, Abd pad, tape [x]  Daily and as needed  []  2 Times Per Week  []  3 Times Per Week  []  Every Other Day              []  Do Not Change Dressing  []  Other:        Cleanse Wound(s) with:   []  Normal Saline  []  Keep Dry    [x]  Soap & Water with dressing changes, keep dressing dry in between      []  Apply Negative Pressure Wound Therapy (NPWT) to:      Use:   []  White Foam  []  Black Foam  []  Silver Foam  []  Other:   With settings of    mmHg     []  Continuous  []  Intermittent:   minutes on and   minutes off.   Change dressing:   []  2 Times Per Week    []  3 Times Per Week   []  Other:         []   ABI:   []   Right Leg:     []   Left Leg:       []   TBI:   []   Right Leg:     []   Left Leg:         []    Unna Boot      []  Coban 2  []   Profore Lite     []    Profore   []   Right         Leg []   Left        Leg   []   Compression Stockings:   []   20-30 mmHg  []   30-40 mmHg  []   Apply in morning and remove at bedtime.      []   Right         Leg []   Left        Leg    []   Leg Elevation:  Elevate leg(s) to level of heart   or above for 30 minutes 3 times per day.  []   Right         Leg []   Left        Leg    []   Offloading/Immobilization:   []   Total Contact Cast     []   Air Cast   []   Multipodus Splint   []   Darco Shoe  []   Right         Leg    []   Left        Leg        []   Assistive Device(s):   []   Wheelchair   []   Walker   []   Cane   []   Crutches      []   Wedge Shoe   []   Other:    []   Pressure Reduction:   []   Wheelchair Cushion   []   Mattress Overlay      []   Specialty Bed   []   Reposition every 2 hours         []   Limit time up in your chair to 2 hours or less at a time   []   Other:    []   Home Health:    []   VNS   []   LTC   []   HCR   []   Other:    []   General:    []   Stop / Decrease Smoking   [x]   Multivitamin   []   Exercise   []    Other:        []   Discharge from [facility] Discharge Instructions    Following the instructions below will give you the best opportunity for wound healing.    Wound Care Instructions (If changing own dressing):   Gather all supplies you will need to change your dressing.   Before changing your dressing, wash your hands for at least 15 seconds with warm soapy water.   Rinse off all soap, then dry with a towel.   Remove the old dressing.  Wash hands again before applying the new dressing.    If you experience any of the following during our business hours of 8 AM - 4:30 PM, Monday - Friday, please call the [facility] at [phone]:    ? Increase in pain  ?Temperature over 101F   ?Drainage with a foul odor   ?Bleeding   ? Increase in swelling  ? Increase in drainage from your wound  ? Need for compression bandage changes (slippage, breakthrough drainage)    Please contact your primary care physician or proceed to the nearest emergency room if you experience any of the above after our business hours.     Please note your appointment(s) above - if you are unable to keep, kindly give 24 hours' notice.  Thank You!

## 2015-07-22 ENCOUNTER — Ambulatory Visit: Payer: Self-pay | Admitting: Surgery

## 2015-07-22 ENCOUNTER — Encounter: Payer: Self-pay | Admitting: Surgery

## 2015-07-22 ENCOUNTER — Telehealth: Payer: Self-pay | Admitting: Urology

## 2015-07-22 VITALS — BP 98/57 | HR 116 | Temp 95.7°F | Resp 18 | Ht 65.0 in | Wt 135.0 lb

## 2015-07-22 DIAGNOSIS — L89159 Pressure ulcer of sacral region, unspecified stage: Secondary | ICD-10-CM

## 2015-07-22 DIAGNOSIS — G909 Disorder of the autonomic nervous system, unspecified: Secondary | ICD-10-CM

## 2015-07-22 DIAGNOSIS — N390 Urinary tract infection, site not specified: Secondary | ICD-10-CM

## 2015-07-22 NOTE — Progress Notes (Signed)
Subjective:     HPI:  Cindy Ochoa is a 29 y.o. 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 Patient with remaining sacral stage 4 pressure ulcer. The bilateral ischial wounds remain healed. Patient has low air loss bed mattress and adequate cushions for her wheel chair. Current wound care is gauze packing daily. Her new care aides accompany her today to learn wound care. Patient is without new complaints. 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    Past Surgical History:  She has a past surgical history that includes Multiple injuries (04/29/2005 ); craniotomy (04/29/2005); Cervical spine surgery (04/29/2005); Cervical spine surgery (05/04/2005); Tracheostomy tube placement (05/15/05); Gastrostomy tube placement (05/15/05); IVC filter (May 2006 ); Urethral plication (66/44/0347); Left Tibia fracture (06/01/07); PICC insertion > 5 years (08/27/2012); and ileal loop urinary diversion (08/26/2012  ).    Allergies:  Sheis allergic to nitrofurantoin; vancomycin; and heparin.    Medications:  Current Outpatient Prescriptions   Medication Sig    lidocaine (XYLOCAINE) 2 % jelly Apply topically as needed for Pain (PRN @@ wound clinic)    naproxen (ALEVE) 220 MG capsule Take 1 capsule (220 mg total) by mouth every 8 hours as needed for Headaches    cephalexin (KEFLEX) 250 MG/5ML suspension Take 10 mLs (500 mg total) by mouth 4 times daily    BISAC-EVAC 10 MG suppository PLACE 1 SUPPOSITORY RECTALLY DAILY AS NEEDED.  UNWRAP FIRST.  USE FOR CONSTIPATION.    Non-System Medication Urostomy drainage bags change as needed. Dx N39.42 and  G82.54    incontinence supply disposable Large pull ups - use up to 5 x daily  Dx N39.42    sulfamethoxazole-trimethoprim (BACTRIM DS,SEPTRA DS) 800-160 MG per tablet Take 1 tablet by mouth 2 times daily    sulfamethoxazole-trimethoprim (BACTRIM DS,SEPTRA DS) 800-160 MG per tablet Take 1 tablet by mouth 2 times daily    disposable gloves 2 boxes Disposable Medium size gloves    disposable gloves 2 boxes Disposable Medium size gloves    disposable underpads 30"x36" (CHUX) Use 6 times daily and PRN. Dx N39.42  Incontinence without sensory awareness    senna-docusate (PERICOLACE) 8.6-50 MG per tablet Take 1 tablet by mouth daily as needed for Constipation    Non-System Medication Shower Chair - Quadriparesis G82.54    Ostomy Supplies (HOLLISTER REPLACEMENT FILTERS) [place] (order # [phone]).    ARTIFICIAL TEARS 1.4 % ophthalmic solution PLACE 1 DROP INTO BOTH EYES FOUR TIMES DAILY    Non-System Medication PRI AND SCREEN  DIAGNOSIS 344.04 Quadriparesis    white petrolatum-mineral oil (GENTEAL PM) 85-15 % ophthalmic ointment Place into both eyes nightly    docusate sodium (DOK) 100 MG capsule Take 1 capsule (100 mg total) by mouth 2 times daily  Skin Protectants, Misc. OINT Apply 1 Tube topically 2 times daily      Non-System Medication One Hoyer  Lift  Diagnosis:  Quadriplegic ICD-9 344.04     No current facility-administered medications for this visit.        Pain:  The patient describes pain as   Pain    07/22/15 1306 07/22/15 1331   PainSc:   0 - No pain   0 - No pain        Nutrition:  Cindy Ochoa has a good appetite and good fluid intake.  does take vitamin and mineral supplements    Current Home DME:  Current DME supplier is: [facility]  Home Care services: yes; CD pass    Social History:  She reports that she quit smoking about 3 years ago. Her smoking use included Cigarettes. She started smoking about 3 years ago. She has a 1.50 pack-year smoking history. She quit smokeless tobacco use about 10 years ago. She reports that she uses illicit drugs, including Marijuana. She reports that she does not drink alcohol.    Barriers to Care/Learning:   There may be  disease state and support Barriers to Care/Learning    Review of Systems:  Pertinent items are noted in HPI.     Pertinent Studies  Imaging negative for active osteomyelitis; degenerative and surgical changes to pelvic films   Labs unremarkable    Objective:     Physical Exam:  Visit Vitals    BP 98/57 (BP Location: Right arm, Patient Position: Sitting, Cuff Size: large adult)    Pulse (!) 116    Temp 35.4 C (95.7 F) (Temporal)    Resp 18    Ht 1.651 m (5\' 5" )    Wt 61.2 kg (135 lb)    BMI 22.47 kg/m2       General Appearance:    Alert, cooperative spastic quadriplegic female, diaphoretic, no distress, appears stated age and well nourished   Lungs:     Clear to auscultation bilaterally, respirations unlabored    Heart:    Regular rate and rhythm, S1 and S2 normal   Extremities:   Spastic quadraplegia    Skin:   Closed ischial wound scars; See wound note. 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   Plan Conservative Wound management  Care aides instructed in wound care today    The wound management that I would recommend is as follows:     1. Debridement of unhealthy wound tissues, as needed to promote healing  2. Pack wound with Aquacel and cover with ABD pad daily - applied today     Patient instructions were given in verbalization, demonstration and in writing   Patient was able to verbalize understanding of what was reviewed today in their own words  Patient was agreeable to the plan of care    I will contact the referring health care provider with my findings and recommendations for care. Patient's wound will be managed by [facility] collaboratively with referring provider    Follow Up:  1 month    Thank you very much for the opportunity to assist in the evaluation and management of this patient's wound problem

## 2015-07-22 NOTE — Telephone Encounter (Signed)
Ms. Franqui calling to report that she is experiencing UTI symptoms, sweating spells, and her urine has a bad odor. These symptoms have been going on for 1 week(s)    Patient requesting same day appointment? yes  Patient requesting call back? yes    Phone number confirmed at [phone]

## 2015-07-22 NOTE — Telephone Encounter (Signed)
Left message on patients answering machine per HIPPA in [place].Please encourage pt to drop off urine for culture/urinalysis at any [facility] lab (order has been put in).

## 2015-07-22 NOTE — Patient Instructions (Addendum)
Please follow these instructions about how to care for your wound.            Wound Location Dressing Orders Frequency of Dressing Change   Location: Sacrum Skin prep, Aquacel Packing into wound,  Abd pad, tape    [x]  Daily           Cleanse Wound(s) with:     [x]  Soap & Water with dressing changes, keep dressing dry in between                [x]   Assistive Device(s):   [x]   Wheelchair             []   Discharge from [facility] Discharge Instructions    Following the instructions below will give you the best opportunity for wound healing.    Wound Care Instructions (If changing own dressing):   Gather all supplies you will need to change your dressing.   Before changing your dressing, wash your hands for at least 15 seconds with warm soapy water.   Rinse off all soap, then dry with a towel.   Remove the old dressing.  Wash hands again before applying the new dressing.    If you experience any of the following during our business hours of 8 AM - 4:30 PM, Monday - Friday, please call the [facility] at [phone]:    ? Increase in pain  ?Temperature over 101F   ?Drainage with a foul odor   ?Bleeding   ? Increase in swelling  ? Increase in drainage from your wound  ? Need for compression bandage changes (slippage, breakthrough drainage)    Please contact your primary care physician or proceed to the nearest emergency room if you experience any of the above after our business hours.     Please note your appointment(s) above - if you are unable to keep, kindly give 24 hours' notice.  Thank You!

## 2015-07-23 ENCOUNTER — Telehealth: Payer: Self-pay

## 2015-07-23 NOTE — Telephone Encounter (Signed)
Patient called back for instructions. Per note below, advised her to drop off a urine for analysis at any [facility] lab.     She can be reached if necessary at [phone].

## 2015-07-23 NOTE — Telephone Encounter (Signed)
Cindy Ochoa would like Cindy Ochoa to call her regarding pain meds to relieve pain while the nurse pack her wound; please call [facility] @ [phone].

## 2015-07-23 NOTE — Telephone Encounter (Signed)
Spoke to Cindy Ochoa today. She is having exacerbation of the pain and associated autonomic dysreflexia when staff do wound care. She has not seen Physical Medicine in more than 1 year and had tried baclofen without success. I advised she call again and be seen and continue to work on the management of this problems with Dr Nash Mantis - she agreed

## 2015-07-24 ENCOUNTER — Telehealth: Payer: Self-pay | Admitting: Internal Medicine

## 2015-07-24 NOTE — Telephone Encounter (Signed)
Spoke to patient who states that she had a wound care appointment on Monday.    She asked to have a script for pain medication written at the appointment but states that she was told that the provider, "was not licensed to write for controlled substances."  She states she was told to call her PCP to have script written.    Has naproxen on med list, is not taking.  States that the pain she is experiencing is during the packing of her wounds and is intolerable.      She is asking that a script be written for her pain.      Will route to Dr. Modesta Messing to advise.

## 2015-07-24 NOTE — Telephone Encounter (Signed)
Patient returning the call to speak with the RN about the information below. Patient can be reached [phone].

## 2015-07-24 NOTE — Telephone Encounter (Signed)
Left message for Cindy Ochoa or patient to call the office to discuss her pain and spasms.    Call back number provided.

## 2015-07-24 NOTE — Telephone Encounter (Signed)
Cindy Ochoa calling to advise that her Daughter is in a lot of pain and having spasms from the pain.  She states patient is paralyzed but feels the pain and she can't sleep due to the spasms.  Cindy Ochoa states she would like a script called into [facility] on [address].  She would like a call back at [phone] to discuss.

## 2015-07-25 NOTE — Telephone Encounter (Signed)
error 

## 2015-07-26 ENCOUNTER — Telehealth: Payer: Self-pay | Admitting: Urology

## 2015-07-26 ENCOUNTER — Ambulatory Visit
Admit: 2015-07-26 | Discharge: 2015-07-26 | Disposition: A | Discharge status: Home / Self Care | Payer: Self-pay | Source: Ambulatory Visit | Attending: Urology | Admitting: Urology

## 2015-07-26 ENCOUNTER — Other Ambulatory Visit: Payer: Self-pay | Admitting: Urology

## 2015-07-26 DIAGNOSIS — N319 Neuromuscular dysfunction of bladder, unspecified: Secondary | ICD-10-CM

## 2015-07-26 DIAGNOSIS — N390 Urinary tract infection, site not specified: Secondary | ICD-10-CM

## 2015-07-26 LAB — COMPREHENSIVE METABOLIC PANEL
ALT: 15 U/L (ref 0–35)
AST: 16 U/L (ref 0–35)
Albumin: 4.3 g/dL (ref 3.5–5.2)
Alk Phos: 65 U/L (ref 35–105)
Anion Gap: 15 (ref 7–16)
Bilirubin,Total: 0.3 mg/dL (ref 0.0–1.2)
CO2: 24 mmol/L (ref 20–28)
Calcium: 9.4 mg/dL (ref 8.8–10.2)
Chloride: 104 mmol/L (ref 96–108)
Creatinine: 0.36 mg/dL — ABNORMAL LOW (ref 0.51–0.95)
GFR,Black: 167 *
GFR,Caucasian: 145 *
Glucose: 95 mg/dL (ref 60–99)
Lab: 9 mg/dL (ref 6–20)
Potassium: 3.9 mmol/L (ref 3.3–5.1)
Sodium: 143 mmol/L (ref 133–145)
Total Protein: 7.7 g/dL (ref 6.3–7.7)

## 2015-07-26 LAB — URINALYSIS WITH MICROSCOPIC
Blood,UA: NEGATIVE
Ketones, UA: NEGATIVE
Nitrite,UA: POSITIVE — AB
Protein,UA: 100 mg/dL — AB
RBC,UA: 3 /hpf — AB (ref 0–2)
Specific Gravity,UA: 1.015 (ref 1.002–1.030)
WBC,UA: 46 /hpf — ABNORMAL HIGH (ref 0–5)
pH,UA: 6 (ref 5.0–8.0)

## 2015-07-26 MED ORDER — CIPROFLOXACIN HCL 500 MG PO TABS *I*
500.0000 mg | ORAL_TABLET | Freq: Two times a day (BID) | ORAL | 0 refills | Status: AC
Start: 2015-07-26 — End: 2015-08-05

## 2015-07-26 NOTE — Telephone Encounter (Signed)
Urology Nurse--Please contact this patient and find out if her UTI is being treated. I wrote script for Cipro that she can pick up. If she is being treated, never mind. Thank you!

## 2015-07-27 ENCOUNTER — Other Ambulatory Visit: Payer: Self-pay | Admitting: Internal Medicine

## 2015-07-28 LAB — AEROBIC CULTURE

## 2015-07-29 NOTE — Telephone Encounter (Signed)
Refilled.  Please call patient to inform.

## 2015-07-31 ENCOUNTER — Encounter: Payer: Self-pay | Admitting: Gastroenterology

## 2015-08-08 ENCOUNTER — Ambulatory Visit: Payer: Self-pay | Admitting: Obstetrics and Gynecology

## 2015-08-21 ENCOUNTER — Encounter: Payer: Self-pay | Admitting: Gastroenterology

## 2015-08-21 ENCOUNTER — Ambulatory Visit: Payer: Self-pay | Admitting: Surgery

## 2015-08-26 ENCOUNTER — Ambulatory Visit: Payer: Self-pay | Admitting: Internal Medicine

## 2015-08-27 ENCOUNTER — Encounter: Payer: Self-pay | Admitting: Gastroenterology

## 2015-08-27 ENCOUNTER — Telehealth: Payer: Self-pay | Admitting: Urology

## 2015-08-27 DIAGNOSIS — N390 Urinary tract infection, site not specified: Secondary | ICD-10-CM

## 2015-08-27 MED ORDER — CIPROFLOXACIN HCL 500 MG PO TABS *I*
500.0000 mg | ORAL_TABLET | Freq: Two times a day (BID) | ORAL | 0 refills | Status: DC
Start: 2015-08-27 — End: 2015-08-29

## 2015-08-27 NOTE — Telephone Encounter (Signed)
Patient stated that she believes that she has another UTI and was wondering if she could have some antibiotics called in or does she need to have lab work done prior. Patient can be reached back at [phone]. Patient stated she would like medication sent to [facility]

## 2015-08-27 NOTE — Telephone Encounter (Signed)
Cindy Ochoa,   Spoke with [place]. Will drop a urine sample off for culture tomorrow at a UR Lab. Not able to drop the sample off today. Stated she feels "like she's dying". Advised to increase fluid intake. Confirmed pharmacy.

## 2015-08-27 NOTE — Telephone Encounter (Signed)
Spoke to patient. She is having frequent bladder spasms, body sweats, and dark, foul smelling urine. She states that this is how she usually feels when she has a UTI. She is unable to identify if she is feeling pain due to her hx of quadraparesis. She denies fever. Wrote for Cipro based on last culture. I asked her to provide a urine sample prior to starting antibiotics. She will be unable to pick up the prescription or provide a sample until morning. She is aware that if she develops a fever or develops pain, she should visit the emergency room. Encouraged PO fluids.

## 2015-08-28 ENCOUNTER — Ambulatory Visit: Payer: Self-pay | Admitting: Internal Medicine

## 2015-08-28 ENCOUNTER — Encounter: Payer: Self-pay | Admitting: Internal Medicine

## 2015-08-28 ENCOUNTER — Ambulatory Visit
Admission: RE | Admit: 2015-08-28 | Discharge: 2015-08-28 | Disposition: A | Discharge status: Home / Self Care | Payer: Self-pay | Source: Ambulatory Visit | Attending: Urology | Admitting: Urology

## 2015-08-28 VITALS — BP 122/72 | HR 72 | Temp 97.0°F | Ht 65.0 in | Wt 132.0 lb

## 2015-08-28 DIAGNOSIS — N390 Urinary tract infection, site not specified: Secondary | ICD-10-CM

## 2015-08-28 DIAGNOSIS — N319 Neuromuscular dysfunction of bladder, unspecified: Secondary | ICD-10-CM

## 2015-08-28 DIAGNOSIS — G8254 Quadriplegia, C5-C7 incomplete: Secondary | ICD-10-CM

## 2015-08-28 DIAGNOSIS — L8994 Pressure ulcer of unspecified site, stage 4: Secondary | ICD-10-CM

## 2015-08-28 MED ORDER — ACETAMINOPHEN 500 MG PO TABS *I*
500.0000 mg | ORAL_TABLET | Freq: Three times a day (TID) | ORAL | 0 refills | Status: DC | PRN
Start: 2015-08-28 — End: 2015-09-25

## 2015-08-28 MED ORDER — LIDOCAINE HCL 2 % EX JELLY WRAPPED *I*
CUTANEOUS | 1 refills | Status: DC
Start: 2015-08-28 — End: 2016-05-18

## 2015-08-28 NOTE — Telephone Encounter (Signed)
Spoke with patient.  She is not allergic to Cipro, she stated it just doesn't work.  However I explained that when we get the urine culture back, we will know if it works or not and to give [place] a call in 48 hours so we can double check the C&Sresults.

## 2015-08-28 NOTE — Progress Notes (Signed)
Reason For Visit: The primary encounter diagnosis was Neurogenic bladder. 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      HPI:  Cindy Ochoa is 29 y.o. year old female with neurogenic bladder, quadriplegic at C6, decubitus ulcer,    Patient is here today for her smoke care form to be completed    She has contacted her urologist and they've asked for her to leave a urine in the lab.  I provided her with a sterilized cup, it appears that ciprofloxacin has been sent to her pharmacy.  Patient does not have a fever her blood pressure stable.  She does states she feels tired and fatigue.  Patient does have autonomic dysfunction so she does sweat a lot. Patient doesn't neurogenic bladder and urostomy in 2013     Patient is quadriplegic at C6  She is a motorized wheelchair  She has some movement of her upper extremities but is total care    Patient has recently developed decubitus ulcer  She has a service that is helping but she does not feel like the hard doing the dressings right  She is followed by the wound care clinic  Patient does have some pain with packing     Social here with mom     Medications:     Current Outpatient Prescriptions   Medication Sig    lidocaine (XYLOCAINE) 2 % jelly Apply to wound prior to packing    acetaminophen (TYLENOL) 500 mg tablet Take 1 tablet (500 mg total) by mouth 3 times daily as needed for Pain    ciprofloxacin (CIPRO) 500 MG tablet Take 1 tablet (500 mg total) by mouth 2 times daily for 7 days    BISAC-EVAC 10 MG suppository PLACE 1 SUPPOSITORY RECTALLY DAILY AS NEEDED. UNWRAP FIRST. USE FOR CONSTIPATION    lidocaine (XYLOCAINE) 2 % jelly Apply topically as needed for Pain (PRN @@ wound clinic)    naproxen (ALEVE) 220 MG capsule Take 1 capsule (220 mg total) by mouth every 8 hours as needed for Headaches    Non-System Medication Urostomy drainage bags change as needed. Dx N39.42 and  G82.54    incontinence supply  disposable Large pull ups - use up to 5 x daily  Dx N39.42    disposable gloves 2 boxes Disposable Medium size gloves    disposable gloves 2 boxes Disposable Medium size gloves    disposable underpads 30"x36" (CHUX) Use 6 times daily and PRN. Dx N39.42  Incontinence without sensory awareness    senna-docusate (PERICOLACE) 8.6-50 MG per tablet Take 1 tablet by mouth daily as needed for Constipation    Non-System Medication Shower Chair - Quadriparesis G82.54    Ostomy Supplies (HOLLISTER REPLACEMENT FILTERS) [place] (order # [phone]).    ARTIFICIAL TEARS 1.4 % ophthalmic solution PLACE 1 DROP INTO BOTH EYES FOUR TIMES DAILY    Non-System Medication PRI AND SCREEN  DIAGNOSIS 344.04 Quadriparesis    white petrolatum-mineral oil (GENTEAL PM) 85-15 % ophthalmic ointment Place into both eyes nightly    docusate sodium (DOK) 100 MG capsule Take 1 capsule (100 mg total) by mouth 2 times daily    Skin Protectants, Misc. OINT Apply 1 Tube topically 2 times daily      Non-System Medication One Hoyer Lift  Diagnosis:  Quadriplegic ICD-9 344.04     No current facility-administered medications for this visit.        Medication list reconciled this visit  Allergies:     Allergies   Allergen Reactions    Nitrofurantoin Nausea And Vomiting    Vancomycin Hives     hives 2006 but tolerated Rx in 2010  pt states she had vancomycin in 04/2012.  Feels this is not true allergy  as she has received it recently - had no reaction     Heparin Other (See Comments)     Thrombocytopenia;        Social history      Social History   Substance Use Topics    Smoking status: Former Smoker     Packs/day: 0.50     Years: 3.00     Types: Cigarettes     Start date: 12/09/2011     Quit date: 05/25/2012    Smokeless tobacco: Former Systems developer     Quit date: 04/29/2005      Comment: Quit Smoking July 2014    Alcohol use No       Review of Systems     CONSTITUTIONAL: Appetite good,   CV: No chest pain, shortness of breath or peripheral  edema  RESPIRATORY: No cough, wheezing or dyspnea  GI: No nausea/vomiting, abdominal pain, or change in bowel habits  GU: No dysuria, urgency, frequency,incontinence or hematuria      Physical Exam:     Patient is alert and oriented and in no distress.   Cardiovascular: Normal rate, regular rhythm, S1 normal and S2 normal, no edema.     Pulmonary/Chest: Lungs are clear   Abdominal: soft, non-tender, bowel sounds are normal urostomy draining yellow urine  Skin: Skin is warm and dry. No rash noted.       Vitals:    08/28/15 0938   BP: 122/72   Pulse: 72   Temp: 36.1 C (97 F)   Weight: 59.9 kg (132 lb)   Height: 1.651 m (5\' 5" )     Wt Readings from Last 3 Encounters:   08/28/15 59.9 kg (132 lb)   07/22/15 61.2 kg (135 lb)   07/05/15 61.2 kg (135 lb)     BP Readings from Last 3 Encounters:   08/28/15 122/72   07/22/15 98/57   07/05/15 94/55           RESULTS:         Component Value Date/Time    NA 143 07/26/2015 1108    K 3.9 07/26/2015 1108    CREAT 0.36 (L) 07/26/2015 1108    CREAT 0.45 (L) 06/12/2015 1028    GLU 95 07/26/2015 1108    CA 9.4 07/26/2015 1108     Lab Results   Component Value Date    ALT 15 07/26/2015    AST 16 07/26/2015    GGT 35 11/19/2009        Lab Results   Component Value Date    TSH 2.17 12/15/2013     Lab Results   Component Value Date    WBC 6.9 06/12/2015    HGB 13.5 06/12/2015    HCT 41 06/12/2015    MCV 96 (H) 06/12/2015    PLT CANCELED 06/12/2015     Lab Results   Component Value Date    CREAT 0.36 (L) 07/26/2015       ASSESSMENT/PLAN:     Neurogenic bladder/possible UTI      Take your into the lab       Start Cipro        Take temperature twice a day, if any elevation in temperature  patient report to the emergency room    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       Follow up: follow-up in one month with PCP                             Lavone Orn,  Nurse Practitioner  [facility] Internal  Medicine

## 2015-08-28 NOTE — Telephone Encounter (Signed)
Patient calling stating she can not take Cipro. Patient would like a call back at [phone].

## 2015-08-29 ENCOUNTER — Other Ambulatory Visit: Payer: Self-pay | Admitting: Urology

## 2015-08-29 MED ORDER — CIPROFLOXACIN HCL 500 MG PO TABS *I*
500.0000 mg | ORAL_TABLET | Freq: Two times a day (BID) | ORAL | 0 refills | Status: AC
Start: 2015-08-29 — End: 2015-09-05

## 2015-08-29 NOTE — Telephone Encounter (Signed)
Santa Genera has assessed this patient and ordered abx. Unfortunately, at this time, she is not able to order due to insurance coverage. I have reordered a 7d script for abx. Cipro.     Cipro order to local pharmacy has been sent.

## 2015-08-29 NOTE — Telephone Encounter (Signed)
Dr. [name] will need to order. Thank you

## 2015-08-29 NOTE — Telephone Encounter (Signed)
Patient called and stated a new script signed by the provider for her Cipro needs to be resent to [place] at [facility].  The one that was sent was signed by our NP and her insurance company is not accepting this.  Patient can be reached at [phone] if necessary.

## 2015-08-29 NOTE — Telephone Encounter (Signed)
Patient advised  kmulvihill lpn

## 2015-08-31 LAB — AEROBIC CULTURE

## 2015-09-01 ENCOUNTER — Encounter: Payer: Self-pay | Admitting: Urology

## 2015-09-03 ENCOUNTER — Ambulatory Visit: Payer: Self-pay | Admitting: Surgery

## 2015-09-03 ENCOUNTER — Encounter: Payer: Self-pay | Admitting: Surgery

## 2015-09-03 ENCOUNTER — Other Ambulatory Visit: Payer: Self-pay | Admitting: Urology

## 2015-09-03 VITALS — BP 112/77 | HR 101 | Temp 96.4°F | Resp 18 | Ht 65.0 in | Wt 132.0 lb

## 2015-09-03 DIAGNOSIS — L89159 Pressure ulcer of sacral region, unspecified stage: Secondary | ICD-10-CM

## 2015-09-03 MED ORDER — SULFAMETHOXAZOLE-TRIMETHOPRIM 800-160 MG PO TABS *I*
1.0000 | ORAL_TABLET | Freq: Two times a day (BID) | ORAL | 0 refills | Status: DC
Start: 2015-09-03 — End: 2015-09-03

## 2015-09-03 NOTE — Patient Instructions (Addendum)
Please follow these instructions about how to care for your wound.            Wound Location Dressing Orders Frequency of Dressing Change   Location: sacrum Skin prep, Aquacel Packing into wound, Abd pad, tape  [x]  Daily        Looks good, measures smaller! Keep up the good work!!!      Cleanse Wound(s) with:     [x]  Soap & Water with dressing changes, keep dressing dry in between            [x]   Assistive Device(s):   [x]   Wheelchair      [x]   Pressure Reduction:   [x]   Wheelchair Cushion   []   Mattress Overlay      [x]   Specialty Bed   [x]   Reposition every 2 hours         [x]   Limit time up in your chair to 2 hours or less at a time           [facility] Discharge Instructions    Following the instructions below will give you the best opportunity for wound healing.    Wound Care Instructions (If changing own dressing):   Gather all supplies you will need to change your dressing.   Before changing your dressing, wash your hands for at least 15 seconds with warm soapy water.   Rinse off all soap, then dry with a towel.   Remove the old dressing.  Wash hands again before applying the new dressing.    If you experience any of the following during our business hours of 8 AM - 4:30 PM, Monday - Friday, please call the [facility] at [phone]:    ? Increase in pain  ?Temperature over 101F   ?Drainage with a foul odor   ?Bleeding   ? Increase in swelling  ? Increase in drainage from your wound  ? Need for compression bandage changes (slippage, breakthrough drainage)    Please contact your primary care physician or proceed to the nearest emergency room if you experience any of the above after our business hours.     Please note your appointment(s) above - if you are unable to keep, kindly give 24 hours' notice.  Thank You!

## 2015-09-03 NOTE — Telephone Encounter (Signed)
Please review below culture you put pt on Cipro needs to be changed due to med is resistant to bacteria that grew.     Aerobic Culture Escherichia coli   Comments: >100,000/ml   Resulting Agency [place]   Susceptibility    Escherichia coli     AEROBIC CULTURE     Amikacin Sensitive     Ampicillin Resistant     Aztreonam Sensitive     Cefazolin Sensitive     Cefepime Sensitive     Ceftriaxone Sensitive     Ciprofloxacin Resistant     Ertapenem Sensitive     ESBL -     Gentamicin Sensitive     Meropenem Sensitive     Nitrofurantoin Sensitive     Piperacillin/Tazobactam Sensitive     Tobramycin Sensitive     Trimethoprim/Sulfa Sensitive              Specimen Collected: 08/28/15 10:05 AM Last Resulted: 08/31/15 9:31 AM

## 2015-09-04 ENCOUNTER — Telehealth: Payer: Self-pay | Admitting: Urology

## 2015-09-04 ENCOUNTER — Other Ambulatory Visit: Payer: Self-pay | Admitting: Internal Medicine

## 2015-09-04 ENCOUNTER — Other Ambulatory Visit: Payer: Self-pay | Admitting: Urology

## 2015-09-04 MED ORDER — SULFAMETHOXAZOLE-TRIMETHOPRIM 400-80 MG PO TABS *I*
1.0000 | ORAL_TABLET | Freq: Two times a day (BID) | ORAL | 0 refills | Status: DC
Start: 2015-09-04 — End: 2015-10-03

## 2015-09-04 MED ORDER — BISACODYL 10 MG RE SUPP *I*
10.0000 mg | Freq: Every day | RECTAL | 0 refills | Status: DC | PRN
Start: 2015-09-04 — End: 2015-09-25

## 2015-09-04 NOTE — Telephone Encounter (Signed)
FYI

## 2015-09-04 NOTE — Telephone Encounter (Signed)
Refilled

## 2015-09-04 NOTE — Telephone Encounter (Signed)
Called in the prescription and gave a verbal order to the pharmacist. This is a problem unique to [facility]. Hopefully this issue is now resolved.

## 2015-09-04 NOTE — Telephone Encounter (Signed)
Spoke to patient. She will call and let us know if she has any issues.

## 2015-09-04 NOTE — Telephone Encounter (Signed)
Patient states [facility] will not fill the prescription because Derrek Monaco, NP is "not licensed to bill Medicaid".  Patient is requesting a different provider send this prescription so she is able to fill it.    Please contact her to advise.

## 2015-09-04 NOTE — Telephone Encounter (Signed)
Called patient, left VM. Bactrim ordered by Robb Matar on my behalf.

## 2015-09-04 NOTE — Telephone Encounter (Signed)
Cindy Ochoa calling to request prescription(s) listed below to be sent to [facility]    Is patient out of the medication? yes  Does the patient have questions regarding the medication for the nurse? no    Patient can be reached if necessary at [phone].    Requested Prescriptions     Pending Prescriptions Disp Refills    bisacodyl (BISAC-EVAC) 10 MG suppository 12 suppository 0     Sig: Place 1 suppository (10 mg total) rectally daily as needed   Unwrap first

## 2015-09-04 NOTE — Telephone Encounter (Signed)
Patient called and states that her prescription for Bactrim needs to be sent by another physician as [facility] is telling her Santa Genera is not licensed to bill Medicaid for the medication. She would like this sent over from someone else today.

## 2015-09-05 NOTE — Progress Notes (Signed)
Subjective:     HPI:  Cindy Ochoa is a 29 y.o. 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 Current wound care is gauze packing daily. Cindy Ochoa accompanied today. No new complaints. Needs dressing supply orders sent  Patient has low air loss bed mattress and adequate cushions for her wheel chair. 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    Allergies:  Sheis allergic to nitrofurantoin; vancomycin; and heparin.    Medications:  Current Outpatient  Prescriptions   Medication Sig    ciprofloxacin (CIPRO) 500 MG tablet Take 1 tablet (500 mg total) by mouth 2 times daily for 7 days    lidocaine (XYLOCAINE) 2 % jelly Apply to wound prior to packing    acetaminophen (TYLENOL) 500 mg tablet Take 1 tablet (500 mg total) by mouth 3 times daily as needed for Pain    lidocaine (XYLOCAINE) 2 % jelly Apply topically as needed for Pain (PRN @@ wound clinic)    naproxen (ALEVE) 220 MG capsule Take 1 capsule (220 mg total) by mouth every 8 hours as needed for Headaches    Non-System Medication Urostomy drainage bags change as needed. Dx N39.42 and  G82.54    incontinence supply disposable Large pull ups - use up to 5 x daily  Dx N39.42    disposable gloves 2 boxes Disposable Medium size gloves    disposable gloves 2 boxes Disposable Medium size gloves    disposable underpads 30"x36" (CHUX) Use 6 times daily and PRN. Dx N39.42  Incontinence without sensory awareness    senna-docusate (PERICOLACE) 8.6-50 MG per tablet Take 1 tablet by mouth daily as needed for Constipation    Non-System Medication Shower Chair - Quadriparesis G82.54    Ostomy Supplies (HOLLISTER REPLACEMENT FILTERS) [facility] (order # [phone]).    ARTIFICIAL TEARS 1.4 % ophthalmic solution PLACE 1 DROP INTO BOTH EYES FOUR TIMES DAILY    Non-System Medication PRI AND SCREEN  DIAGNOSIS 344.04 Quadriparesis    white petrolatum-mineral oil (GENTEAL PM) 85-15 % ophthalmic ointment Place into both eyes nightly    docusate sodium (DOK) 100 MG capsule Take 1 capsule (100 mg total) by mouth 2 times daily    Skin Protectants, Misc. OINT Apply 1 Tube topically 2 times daily      Non-System Medication One Hoyer Lift  Diagnosis:  Quadriplegic ICD-9 344.04    bisacodyl (BISAC-EVAC) 10 MG  suppository Place 1 suppository (10 mg total) rectally daily as needed   Unwrap first    sulfamethoxazole-trimethoprim (BACTRIM,SEPTRA) 400-80 MG per tablet Take 1 tablet by mouth 2 times daily     No current  facility-administered medications for this visit.        Pain:  The patient describes pain as   Pain    09/03/15 1503   PainSc:   0 - No pain        Nutrition:  Saree has a good appetite and good fluid intake.  does take vitamin and mineral supplements    Current Home DME:  Current DME supplier is: [facility]  Home Care services: yes; CD pass    Social History:  She reports that she quit smoking about 3 years ago. Her smoking use included Cigarettes. She started smoking about 3 years ago. She has a 1.50 pack-year smoking history. She quit smokeless tobacco use about 10 years ago. She reports that she uses illicit drugs, including Marijuana. She reports that she does not drink alcohol.    Barriers to Care/Learning:   There may be  disease state and support Barriers to Care/Learning    Review of Systems:  Pertinent items are noted in HPI.     Pertinent Studies  Imaging negative for active osteomyelitis; degenerative and surgical changes to pelvic films   Labs unremarkable    Objective:     Physical Exam:  Visit Vitals    BP 112/77 (BP Location: Right arm, Patient Position: Sitting, Cuff Size: adult)    Pulse 101    Temp 35.8 C (96.4 F) (Temporal)    Resp 18    Ht 1.651 m (5\' 5" )    Wt 59.9 kg (132 lb)    BMI 21.97 kg/m2       General Appearance:    Alert, cooperative spastic quadriplegic female, diaphoretic at baseline, no distress, appears stated age and well nourished   Lungs:     Clear to auscultation bilaterally, respirations unlabored    Heart:    Regular rate and rhythm, S1 and S2 normal   Extremities:   Spastic quadraplegia    Skin:   Closed ischial wound scars; See wound note. General skin color, texture, turgor normal   Neurologic:   CNII-XII intact, insensate quadraplegia       Wound Assessment:  Location: sacrum  Size: 1.9 cm long, 1.0 cm wide, and 2.0 cm deep  Type: pressure ulcer  Tunneling: no  Undermining: no  Drainage: moderate, serous  Odor: none  Tissue Composition: 100 % pink chronic tissues in  small contracted wound cavity; no palpable bone  Periwound Skin:  Dry scar  Grossly Infected: no        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      Plan:     Due to patient profuse diaphoresis due to autonomic dysreflexia, dressings need frequent changing.   Plan Conservative Wound management  Care aides instructed in wound care today    The wound management that I would recommend is as follows:     Wound Location Dressing Orders Frequency of Dressing Change   Location: sacrum Skin prep, Aquacel Packing into wound, Abd pad, tape  [x]  Daily          applied today    Patient instructions were given in verbalization, demonstration and in writing  Patient was able to verbalize understanding of what was reviewed today in  their own words  Patient was agreeable to the plan of care    I will contact the referring health care provider with my findings and recommendations for care. Patient's wound will be managed by [place] collaboratively with referring provider    Follow Up:  1 month    Thank you very much for the opportunity to assist in the evaluation and management of this patient's wound problem

## 2015-09-06 ENCOUNTER — Telehealth: Payer: Self-pay | Admitting: Internal Medicine

## 2015-09-06 NOTE — Telephone Encounter (Signed)
Cameo, from surgical supplies, calling to see if office received letter requesting a tilted shower chair for the patient. Caller states it was mailed out 08/28/15. The letter needs to be signed/dated by the Doctor and sent back when it arrives.    Cameo can be reached at [phone]

## 2015-09-11 ENCOUNTER — Encounter: Payer: Self-pay | Admitting: Gastroenterology

## 2015-09-23 ENCOUNTER — Telehealth: Payer: Self-pay | Admitting: Internal Medicine

## 2015-09-23 NOTE — Telephone Encounter (Signed)
Spoke to pt who reports she hasn't had a BM in 2 weeks.   Reports her mother has had to disimpact her daily.   Reports having emesis and decreased appetite since last week.   Reports abdomen looks like she is "5 mos pregnant".   Discussed with Dr.Richard and advised pt to go to ED.   Pt reports she will try to go today or tomorrow.   Advised pt to go ASAP. Pt agreed with plan.   Offered to assist pt with transportation or 911.   Pt declined at this time.

## 2015-09-23 NOTE — Telephone Encounter (Signed)
Thanks! Hopefully she will go. I don't know why she waited so long.  [name]

## 2015-09-23 NOTE — Telephone Encounter (Signed)
Cindy Ochoa calling to report that she is experiencing constipation. These symptoms have been going on for a few days   Patient would like a script for a stool softener sent to [facility] on [address]    Patient requesting same day appointment? no  Patient requesting call back? yes    Phone number confirmed at [phone]

## 2015-09-24 ENCOUNTER — Emergency Department
Admission: EM | Admit: 2015-09-24 | Disposition: A | Discharge status: Home / Self Care | Payer: Self-pay | Source: Ambulatory Visit | Attending: Emergency Medicine | Admitting: Emergency Medicine

## 2015-09-24 ENCOUNTER — Encounter: Payer: Self-pay | Admitting: Emergency Medicine

## 2015-09-24 LAB — HOLD SST

## 2015-09-24 LAB — HOLD GREEN WITH GEL

## 2015-09-24 LAB — HOLD BLUE

## 2015-09-24 LAB — HM HIV SCREENING OFFERED

## 2015-09-24 LAB — HOLD LAVENDER

## 2015-09-24 MED ORDER — CITRATE OF MAGNESIA PO SOLN *I*
300.0000 mL | Freq: Once | ORAL | Status: AC
Start: 2015-09-24 — End: 2015-09-24
  Administered 2015-09-24: 300 mL via ORAL
  Filled 2015-09-24: qty 300

## 2015-09-24 MED ORDER — CITRATE OF MAGNESIA PO SOLN *I*
300.0000 mL | Freq: Once | ORAL | 0 refills | Status: AC
Start: 2015-09-24 — End: 2015-09-24

## 2015-09-24 NOTE — ED Notes (Signed)
Pt got enema with successful BM. Pt reports feeling relief after BM.

## 2015-09-24 NOTE — Telephone Encounter (Signed)
Patient is returning Emily's call and stated that she is on her way to the ED. She can be reached if necessary at [phone]

## 2015-09-24 NOTE — ED Notes (Signed)
Pt presents c/o constipation x1 week. Denies abdominal pain/N/V. Hx SCI, in wheelchair. Takes colace daily. Urostomy bag in place. Pt states she has stage 11 pressure ulcer on sacrum, followed by wound care and states it is healing. Mother with pt at this time

## 2015-09-24 NOTE — ED Notes (Signed)
Plan of Care     Awaiting imaging results; awaiting provider evaluation; comfort measures; vital signs every 4 hours; will continue to monitor.

## 2015-09-24 NOTE — ED Notes (Signed)
Assumed care of patient at this time. Patient visualized and in no acute distress. Bed in lowest and locked position. Call bell within reach. Will continue to monitor and treat per MD orders.

## 2015-09-24 NOTE — ED Notes (Signed)
ED RN [place], RN (RN) reviewed the following charting information by the RN intern:  Loleta Chance  Nursing Assessments  Medications  Plan of Care  Teaching   Notes    In the chart of Cindy Ochoa (29 y.o. female) and attest to the charting being accurate.

## 2015-09-24 NOTE — Progress Notes (Signed)
Patient DOB:  [id number]    Patient Address:  [address]  [place] [address]    Patient Phone:  [phone] (home) [phone] (work)    Patient Insurance:  Bonnita Nasuti,    [place],    [place]            Patient PCP:  [name], MD    Patient Appointment Calendar  [email or web address] @    Intervention Status:  DSRIP Intervention status: Initial intervention  DSRIP Patient?: Yes  Patient Barriers: other (free text)  Barrier Comment: Uneducated  Patient address confirmation: Yes  Patient phone confirmation: Yes  Patient insurance confirmation: No  Patient [place] referred?: Yes  Patient PCP confirmation: Yes  PCP Appt type: Follow up appt  Follow up appointment center/address:: [facility] [place] [address]  Patient followup provider: [name], MD  DSRIP Patient followup appointment date: 10/09/15  Patient followup appointment time: 0865  Patient transport plan: family  CHW handoff to involved agency?: No  FCM Patient application complete?: No  PAM completed?: Yes  Time Spent with Patient (min): [place]   [phone]

## 2015-09-24 NOTE — ED Notes (Signed)
As per providers orders, pt safe to discharge. Writer discussed discharge instructions and pt verbalized understanding. Pt VSS A&Ox4. Pt leaving with mother in personal wheelchair with belongings.

## 2015-09-24 NOTE — First Provider Contact (Signed)
ED Medical Screening Exam Note    Initial provider evaluation performed by   ED First Provider Contact     Date/Time Event User Comments    09/24/15 1038 ED Provider First Contact [facility], [facility] Initial Face to Face Provider Contact          Vital signs reviewed.    29 year old female quadriplegic (C6 SCI from [place] 2006) presenting with constipation x 1 week. No prior hx of the same. Also with intermittent vomiting during this time.     Orders placed:  XRAYS     Patient requires further evaluation.     Fidel Levy, NP, 09/24/2015, 10:38 AM    Supervising physician Dr. May was immediately available     Fidel Levy, NP  09/24/15 1040

## 2015-09-24 NOTE — ED Provider Progress Notes (Signed)
ED Provider Progress Note    Patient had bowel movement after Magnesium Citrate.  Feeling much improved and would like to go home.  Home bowel regimen was discussed and addition of Maalox was recommended.     Cindy Jews, MD, 09/24/2015, 4:51 PM         Dimarco, Weldon Picking, MD  Resident  09/24/15 [phone]
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
## 2015-09-24 NOTE — ED Triage Notes (Signed)
C/o constipation for the past week.Began with vomiting last Tuesday.       Triage Note   Jola Baptist, RN

## 2015-09-24 NOTE — Discharge Instructions (Signed)
You were seen in the [facility] Emergency Department today for constipation.  An x-ray of the abdomen was performed and showed stool throughout the colon, consistent with constipation.  You were given Magnesium Citrate here in the ED, after which you were able to have a bowel movement.    Continue to take your senna, docusate, and bisacodyl at home.  Buy Maalox (an over the counter medication, available at any pharmacy) and begin taking one capful daily, which will also help you to have bowel movements.  IIf you begin to have constipation, take 5 capfuls of Maalox every hour for 4-5 hours, or until you have a bowel movement.  I have sent a prescription for Magnesium Citrate to your pharmacy.  Only take this medication if you have not had a bowel movement in several days and all four of those other medications are not aiding you in having one.

## 2015-09-24 NOTE — Telephone Encounter (Signed)
LMx1 for pt. Advised pt to call [facility] to speak with nurse. Provided call back number.

## 2015-09-24 NOTE — Telephone Encounter (Signed)
Message noted. Will notify Dr. Modesta Messing and Hassell Done.

## 2015-09-24 NOTE — ED Notes (Signed)
ED RN Navarro, RN (RN) reviewed the following charting information by the RN intern: Charlaine Dalton       Nursing Assessments  Medications  Plan of Care  Teaching   Notes    In the chart of Cindy Ochoa (29 y.o. female) and attest to the charting being accurate.

## 2015-09-25 ENCOUNTER — Telehealth: Payer: Self-pay

## 2015-09-25 ENCOUNTER — Other Ambulatory Visit: Payer: Self-pay | Admitting: Internal Medicine

## 2015-09-25 LAB — HIV 1&2 ANTIGEN/ANTIBODY: HIV 1&2 ANTIGEN/ANTIBODY: NONREACTIVE

## 2015-09-25 MED ORDER — BISACODYL 10 MG RE SUPP *I*
10.0000 mg | Freq: Every day | RECTAL | 0 refills | Status: DC | PRN
Start: 2015-09-25 — End: 2015-11-19

## 2015-09-25 NOTE — Telephone Encounter (Signed)
Refilled

## 2015-09-26 ENCOUNTER — Ambulatory Visit: Payer: Self-pay | Admitting: Urology

## 2015-10-03 ENCOUNTER — Encounter: Payer: Self-pay | Admitting: Surgery

## 2015-10-03 ENCOUNTER — Ambulatory Visit: Payer: Self-pay | Admitting: Surgery

## 2015-10-03 VITALS — BP 99/50 | HR 94 | Temp 96.0°F | Resp 18 | Ht 64.02 in | Wt 130.0 lb

## 2015-10-03 DIAGNOSIS — L8993 Pressure ulcer of unspecified site, stage 3: Secondary | ICD-10-CM

## 2015-10-03 NOTE — Progress Notes (Signed)
Subjective:     HPI:  Cindy Ochoa is a 29 y.o. 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 Current wound care is gauze packing daily. Mother accompanied today. No new complaints.  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    Past Surgical History:  She has a past surgical history that includes Multiple injuries (04/29/2005 ); craniotomy (04/29/2005); Cervical spine surgery (04/29/2005); Cervical spine surgery (05/04/2005); Tracheostomy tube placement (05/15/05); Gastrostomy tube placement (05/15/05); IVC filter (May 2006 ); Urethral plication (46/96/2952); Left Tibia fracture (06/01/07); PICC insertion > 5 years (08/27/2012); and ileal loop urinary diversion (08/26/2012 ).    Allergies:  Sheis allergic to nitrofurantoin; vancomycin; and heparin.    Medications:  Current Outpatient Prescriptions   Medication Sig    MAPAP 500 MG tablet take 1 tablet by mouth every 4 to 6 hours if needed for pain     bisacodyl (BISAC-EVAC) 10 MG suppository Place 1 suppository (10 mg total) rectally daily as needed   Unwrap first    lidocaine (XYLOCAINE) 2 % jelly Apply to wound prior to packing    lidocaine (XYLOCAINE) 2 % jelly Apply topically as needed for Pain (PRN @@ wound clinic)    naproxen (ALEVE) 220 MG capsule Take 1 capsule (220 mg total) by mouth every 8 hours as needed for Headaches    Non-System Medication Urostomy drainage bags change as needed. Dx N39.42 and  G82.54    incontinence supply disposable Large pull ups - use up to 5 x daily  Dx N39.42    disposable gloves 2 boxes Disposable Medium size gloves    disposable gloves 2 boxes Disposable Medium size gloves    disposable underpads 30"x36" (CHUX) Use 6 times daily and PRN. Dx N39.42  Incontinence without sensory awareness    senna-docusate (PERICOLACE) 8.6-50 MG per tablet Take 1 tablet by mouth daily as needed for Constipation    Non-System Medication Shower Chair - Quadriparesis G82.54    Ostomy Supplies (HOLLISTER REPLACEMENT FILTERS) [place] (order # [id number]).    ARTIFICIAL TEARS 1.4 % ophthalmic solution PLACE 1 DROP INTO BOTH EYES FOUR TIMES DAILY    Non-System Medication PRI AND SCREEN  DIAGNOSIS 344.04 Quadriparesis    white petrolatum-mineral oil (GENTEAL PM) 85-15 % ophthalmic ointment Place into both eyes nightly    docusate sodium (DOK) 100 MG capsule Take 1 capsule (100 mg total) by mouth 2 times daily    Skin Protectants, Misc. OINT Apply 1 Tube topically 2 times daily      Non-System Medication One Hoyer Lift  Diagnosis:  Quadriplegic ICD-9 344.04     No current facility-administered medications for this visit.        Pain:  The patient describes pain as   Pain  10/03/15 0917 10/03/15 0932   PainSc:   0 - No pain   0 - No pain        Nutrition:  Cindy Ochoa has a good appetite and good fluid intake.  does take vitamin and mineral supplements    Current Home DME:  Current DME supplier is: [facility]  Home Care services: yes;  CD pass    Social History:  She reports that she quit smoking about 3 years ago. Her smoking use included Cigarettes. She started smoking about 3 years ago. She has a 1.50 pack-year smoking history. She quit smokeless tobacco use about 10 years ago. She reports that she uses illicit drugs, including Marijuana. She reports that she does not drink alcohol.    Barriers to Care/Learning:   There may be  disease state and support Barriers to Care/Learning    Review of Systems:  Pertinent items are noted in HPI.     Pertinent Studies  No recent    Objective:     Physical Exam:  Visit Vitals    BP 99/50 (BP Location: Left arm, Patient Position: Sitting, Cuff Size: large adult)    Pulse 94    Temp 35.6 C (96 F) (Temporal)    Resp 18    Ht 1.626 m (5' 4.02")    Wt 59 kg (130 lb)    BMI 22.3 kg/m2       General Appearance:    Alert, cooperative spastic quadriplegic female, diaphoretic at baseline, no distress, appears stated age and well nourished   Extremities:   Spastic quadraplegia    Skin:   Closed ischial wound scars; See wound note. 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   Plan Conservative Wound management    The wound management that I would recommend is as follows:     Wound Location Dressing Orders Frequency of Dressing Change   Location: sacrum Skin prep, Aquacel Packing into wound, Abd pad, tape  [x]  Daily                 applied today    Patient instructions were given in verbalization, demonstration and in writing  Patient was able to verbalize understanding of what was reviewed today in their own words  Patient was agreeable to the plan of care    I will contact the referring health care provider with my findings and recommendations for care. Patient's wound will be managed by [facility] collaboratively with referring provider    Follow Up:  1 month    Thank you very much for the opportunity to assist in the evaluation and management of this patient's wound problem

## 2015-10-03 NOTE — Patient Instructions (Signed)
Please follow these instructions about how to care for your wound.            Wound Location Dressing Orders Frequency of Dressing Change   Location: Sacrum Skin prep, Aquacel Packing into wound, Abd pad, tape  [x]  Daily   []  2 Times Per Week  []  3 Times Per Week  []  Every Other Day              []  Do Not Change Dressing  []  Other:       Cleanse Wound(s) with:    [x]  Soap & Water with dressing changes, keep dressing dry in between            [x]   Assistive Device(s):   [x]   Wheelchair      [x]   Pressure Reduction:   [x]   Wheelchair Cushion       [x]   Specialty Bed   [x]   Reposition every 2 hours         [x]   Limit time up in your chair to 2 hours or less at a time         []   Discharge from [facility] Discharge Instructions    Following the instructions below will give you the best opportunity for wound healing.    Wound Care Instructions (If changing own dressing):   Gather all supplies you will need to change your dressing.   Before changing your dressing, wash your hands for at least 15 seconds with warm soapy water.   Rinse off all soap, then dry with a towel.   Remove the old dressing.  Wash hands again before applying the new dressing.    If you experience any of the following during our business hours of 8 AM - 4:30 PM, Monday - Friday, please call the [facility] at [phone]:    ? Increase in pain  ?Temperature over 101F   ?Drainage with a foul odor   ?Bleeding   ? Increase in swelling  ? Increase in drainage from your wound  ? Need for compression bandage changes (slippage, breakthrough drainage)    Please contact your primary care physician or proceed to the nearest emergency room if you experience any of the above after our business hours.     Please note your appointment(s) above - if you are unable to keep, kindly give 24 hours' notice.  Thank You!

## 2015-10-08 ENCOUNTER — Telehealth: Payer: Self-pay | Admitting: Physical Medicine and Rehabilitation

## 2015-10-08 NOTE — Telephone Encounter (Signed)
Patient is calling to speak with someone in Dr. Nash Mantis office about possibly becoming a patient again. She is requesting a return call to: [phone]

## 2015-10-09 ENCOUNTER — Encounter: Payer: Self-pay | Admitting: Student in an Organized Health Care Education/Training Program

## 2015-10-09 ENCOUNTER — Ambulatory Visit: Payer: Self-pay | Admitting: Student in an Organized Health Care Education/Training Program

## 2015-10-09 VITALS — BP 114/70 | HR 84 | Temp 96.8°F | Ht 64.02 in | Wt 130.0 lb

## 2015-10-09 DIAGNOSIS — G8250 Quadriplegia, unspecified: Secondary | ICD-10-CM

## 2015-10-09 DIAGNOSIS — N319 Neuromuscular dysfunction of bladder, unspecified: Secondary | ICD-10-CM

## 2015-10-09 DIAGNOSIS — K592 Neurogenic bowel, not elsewhere classified: Secondary | ICD-10-CM

## 2015-10-09 DIAGNOSIS — K5900 Constipation, unspecified: Secondary | ICD-10-CM

## 2015-10-09 MED ORDER — SENNOSIDES-DOCUSATE SODIUM 8.6-50 MG PO TABS *I*
2.0000 | ORAL_TABLET | Freq: Every day | ORAL | 5 refills | Status: DC | PRN
Start: 2015-10-09 — End: 2016-04-10

## 2015-10-09 MED ORDER — POLYETHYLENE GLYCOL 3350 PO PACK 17 GM *I*
17.0000 g | PACK | Freq: Two times a day (BID) | ORAL | 5 refills | Status: AC | PRN
Start: 2015-10-09 — End: 2016-04-06

## 2015-10-09 NOTE — Progress Notes (Signed)
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  Having problems with constipation.  Noticing that her mom often has to manually disimpact her at home.  She has been eating breakfast and dinner.  Has been going to the bathroom every 2 days and states that only small amounts of stool are moving through.  Was told by wound care that she should follow-up with Dr. Lowell Bouton in [place] for back spasm, she tried to call their office yesterday but they stated she wasn't in their system.    Medications:     Current Outpatient Prescriptions on File Prior to Visit   Medication Sig Dispense Refill    MAPAP 500 MG tablet take 1 tablet by mouth every 4 to 6 hours if needed for pain 180 tablet 5    bisacodyl (BISAC-EVAC) 10 MG suppository Place 1 suppository (10 mg total) rectally daily as needed   Unwrap first 12 suppository 0    lidocaine (XYLOCAINE) 2 % jelly Apply to wound prior to packing 85 mL 1    lidocaine (XYLOCAINE) 2 % jelly Apply topically as needed for Pain (PRN @@ wound clinic)      naproxen (ALEVE) 220 MG capsule Take 1 capsule (220 mg total) by mouth every 8 hours as needed for Headaches 30 capsule 1    Non-System Medication Urostomy drainage bags change as needed. Dx N39.42 and  G82.54 10 each 5    incontinence supply disposable Large pull ups - use up to 5 x daily  Dx N39.42 150 each 5    disposable gloves 2 boxes Disposable Medium size gloves 200 each 6    disposable gloves 2 boxes Disposable Medium size gloves 200 each 6    disposable underpads 30"x36" (CHUX) Use 6 times daily and PRN. Dx N39.42  Incontinence without sensory awareness 150 each 5    senna-docusate (PERICOLACE) 8.6-50 MG per tablet Take 1 tablet by mouth daily as needed for Constipation 30 tablet 0    Non-System Medication Shower  Chair - Quadriparesis G82.54 1 each 0    Ostomy Supplies (HOLLISTER REPLACEMENT FILTERS) MISC Hollister Adapter (order # [id number]). 1 each 1    ARTIFICIAL TEARS 1.4 % ophthalmic solution PLACE 1 DROP INTO BOTH EYES FOUR TIMES DAILY 15 mL 10    Non-System Medication PRI AND SCREEN  DIAGNOSIS 344.04 Quadriparesis 1 each 0    white petrolatum-mineral oil (GENTEAL PM) 85-15 % ophthalmic ointment Place into both eyes nightly 3.5 mL 6    docusate sodium (DOK) 100 MG capsule Take 1 capsule (100 mg total) by mouth 2 times daily 60 capsule 11    Skin Protectants, Misc. OINT Apply 1 Tube topically 2 times daily   1 Tube 11    Non-System Medication One Hoyer Lift  Diagnosis:  Quadriplegic ICD-9 344.04 1 each 0     No current facility-administered medications on file prior to visit.        Medications reviewed and reconciled.   Allergies:     Allergies   Allergen Reactions    Nitrofurantoin Nausea And Vomiting    Vancomycin Hives     hives 2006 but tolerated Rx in 2010  pt states she had vancomycin in 04/2012.  Feels this is not true allergy  as she has received  it recently - had no reaction     Heparin Other (See Comments)     Thrombocytopenia;        Review of Systems:     Pertinent positives and negatives as per HPI.     Physical Exam:     Vitals:    10/09/15 1150   BP: 114/70   Pulse: 84   Temp: 36 C (96.8 F)   TempSrc: Temporal   Weight: 59 kg (130 lb)   Height: 1.626 m (5' 4.02")     Wt Readings from Last 3 Encounters:   10/09/15 59 kg (130 lb)   10/03/15 59 kg (130 lb)   09/24/15 59 kg (130 lb)     Body mass index is 22.3 kg/(m^2).  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     Constipation    - Added miralax 17 g bid  - Pericolace (senna- colace combination pill) daily  - Continue bisacodyl suppository every other day  - Will consider enema if above regimen not sufficient to improve constipation    Back Spasms    - will d/w referral coordinator regarding getting patient back in with PMR as she could not do this when she tried calling in    Health Maintenance:    - influenza vaccination: declined flu shot today    RTC in 2 months    [name], MD   Internal Medicine, PGY-2

## 2015-10-09 NOTE — Patient Instructions (Signed)
Pick-up medications from [facility]

## 2015-10-10 ENCOUNTER — Ambulatory Visit: Payer: Self-pay | Admitting: Urology

## 2015-10-10 ENCOUNTER — Ambulatory Visit: Payer: Self-pay | Admitting: Obstetrics and Gynecology

## 2015-10-15 ENCOUNTER — Ambulatory Visit
Admission: RE | Admit: 2015-10-15 | Discharge: 2015-10-15 | Disposition: A | Discharge status: Home / Self Care | Payer: Self-pay | Source: Ambulatory Visit | Admitting: Dental General Practice

## 2015-10-23 ENCOUNTER — Encounter: Payer: Self-pay | Admitting: Obstetrics and Gynecology

## 2015-10-23 ENCOUNTER — Ambulatory Visit: Payer: Self-pay | Admitting: Obstetrics and Gynecology

## 2015-10-23 VITALS — BP 125/73 | HR 81 | Ht 64.0 in

## 2015-10-23 DIAGNOSIS — Z124 Encounter for screening for malignant neoplasm of cervix: Secondary | ICD-10-CM

## 2015-10-23 DIAGNOSIS — Z113 Encounter for screening for infections with a predominantly sexual mode of transmission: Secondary | ICD-10-CM

## 2015-10-23 DIAGNOSIS — Z01419 Encounter for gynecological examination (general) (routine) without abnormal findings: Secondary | ICD-10-CM

## 2015-10-23 DIAGNOSIS — G8250 Quadriplegia, unspecified: Secondary | ICD-10-CM
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
## 2015-10-23 NOTE — Patient Instructions (Signed)
Breast awareness and self-exam         Beginning in their 20s, women should be told about the benefits and limitations of breast self-exam (BSE). Women should be aware of how their breasts normally look and feel and report any new breast changes to a health professional as soon as they are found. Finding a breast change does not necessarily mean there is a cancer.     A woman can notice changes by knowing how her breasts normally look and feel and feeling her breasts for changes (breast awareness), or by choosing to use a step-by-step approach and using a specific schedule to examine her breasts.   Women with breast implants can do BSE. It may be useful to have the surgeon help identify the edges of the implant so that you know what you are feeling. There is some thought that the implants push out the breast tissue and may make it easier to examine. Women who are pregnant or breast-feeding can also choose to examine their breasts regularly.    If you choose to do BSE, the following information provides a step-by-step approach for the exam. The best time for a woman to examine her breasts is when the breasts are not tender or swollen. Women who examine their breasts should have their technique reviewed during their periodic health exams by their health care professional.     It is acceptable for women to choose not to do BSE or to do BSE occasionally. Women who choose not to do BSE should still know how their breasts normally look and feel and report any changes to their doctor right away.    How to examine your breasts  Lie down on your back and place your right arm behind your head. The exam is done while lying down, not standing up. This is because when lying down the breast tissue spreads evenly over the chest wall and is as thin as possible, making it much easier to feel all the breast tissue.    Use the finger pads of the 3 middle fingers on your left hand to feel for lumps in the right breast. Use overlapping  dime-sized circular motions of the finger pads to feel the breast tissue.    Use 3 different levels of pressure to feel all the breast tissue. Light pressure is needed to feel the tissue closest to the skin; medium pressure to feel a little deeper; and firm pressure to feel the tissue closest to the chest and ribs. It is normal to feel a firm ridge in the lower curve of each breast, but, you should tell your doctor if you feel anything else out of the ordinary. If you're not sure how hard to press, talk with your doctor or nurse. Use each pressure level to feel the breast tissue before moving on to the next spot.           Move around the breast in an up and down pattern starting at an imaginary line drawn straight down your side from the underarm and moving across the breast to the middle of the chest bone (sternum or breastbone). Be sure to check the entire breast area going down until you feel only ribs and up to the neck or collar bone (clavicle).        Move around the breast in an up and down pattern starting at an imaginary line drawn straight down your side from the underarm and moving across the breast to the middle   of the chest bone (sternum or breastbone). Be sure to check the entire breast area going down until you feel only ribs and up to the neck or collar bone (clavicle).     There is some evidence to suggest that the up-and-down pattern (sometimes called the vertical pattern) is the most effective pattern for covering the entire breast without missing any breast tissue.   Repeat the exam on your left breast, putting your left arm behind your head and using the finger pads of your right hand to do the exam.     While standing in front of a mirror with your hands pressing firmly down on your hips, look at your breasts for any changes of size, shape, contour, or dimpling, or redness or scaliness of the nipple or breast skin. (The pressing down on the hips position contracts the chest wall muscles and  enhances any breast changes.)    Examine each underarm while sitting up or standing and with your arm only slightly raised so you can easily feel in this area. Raising your arm straight up tightens the tissue in this area and makes it harder to examine.     This procedure for doing breast self-exam is different from previous recommendations. These changes represent an extensive review of the medical literature and input from an expert advisory group. There is evidence that this position (lying down), the area felt, pattern of coverage of the breast, and use of different amounts of pressure increase a woman's ability to find abnormal areas.       Reviewed 10/2011Healthy Practices for Women of all Ages     In addition to your regular OB/GYN history, physical, cholesterol screening, Pap smear, and other recommended cancer screening tests, we ask that you review this list of healthy practices.  Modifying your daily activities according to these practices may improve your overall health and well-being.  In the event of questions about this list, ask your doctor or health care provider.  Additionally, there are specially written pamphlets available, which offer further information.    Diet and Exercise:  - Limit fat and cholesterol; emphasize fruits, grains and vegetables.  - Consume dairy products or use calcium supplementation for adequate calcium intake (1200 mg or more).  - Add folic acid supplementation 0.4 mg (400 micrograms, present in most daily multivitamins) at least two months before considering pregnancy to reduce the risks of birth defects.  - Participate in regular exercise for 30 minutes at least five times a week, and consider weight training.    Injury Prevention:  - Seat/lap belts should be worn while in a moving car.  - Helmet should be used when using motorcycles, bicycles, roller blades and ATVs or skiing.  - Place approved smoke detectors in your house and replace the batteries twice a year.     - Guns and other firearms should be stored unloaded and in a locked area.  Trigger locks should be used as well.  - Consider CPR training for household members.    Dental Health:  - Schedule regular visits to the dentist.  - Floss and brush with fluoride toothpaste daily.    Immunizations:  - A tetanus/diphtheria booster shot (d/T) is recommended every 10 years.  - An MMR vaccine is recommended for non-pregnant women born after 33 without proof of immunity of documentation of previous immunization.  Adults who are susceptible to varicella (chicken pox) should be vaccinated.  - Influenza vaccine is indicated yearly for women age 29  and older, or at any age based on medical history and exposure.  Pregnant women over [redacted] weeks gestation should receive the flu vaccine as well.  - Pneumococcal pneumonia vaccine is indicated for age 42 and older once in your life.  - Hepatitis A and/or B vaccines are recommended for high-risk individuals.    Substance Abuse:  - Stop smoking; do not use any other tobacco products.  - Avoid alcohol use when driving, boating, swimming or operating other machinery. Avoid excessive use of alcoholic beverages.  - Recreational drug use (marijuana, cocaine, etc.) is dangerous and can be habit-forming.    Sexual Behavior:  - Be sure to use contraception if pregnancy is not desired.  - Regular use of female or female condoms with spermicide helps prevent STDs.  - Consider HIV testing if:  1. You have had more than one sexual partner.  2. You have had any STDs.  3. You have used intravenous drugs.  4. You have a sexual partner with these (the above) risk factors.  5. Your sexual partner has had female homosexual exposure.  6. You received a blood transfusion during 1978-1985.    Breast Health:  - Breast self-examinations should be done monthly (after your period).  - A mammogram should be done once between ages 75-40, then every 1-2 years based on risk factors.  If there is a strong family history  of premenopausal breast cancer, you should start with mammograms earlier than age 63.    Colon Cancer Surveillance:  - Beginning at age 66, stool occult blood screening should be done annually and/or sigmoidoscopy (scope examination of the colon) every 3-5 years.    Women and Alcohol  For women, alcohol use carries some unique and special concerns. Most studies on alcohol have been on men, but we now know women may respond differently to alcohol.   Concerns: Liver damage, cancer, menstrual cycle changes, birth defects, fetal alcohol syndrome, intoxication, nutrition and weight management.  All women should avoid all alcohol during pregnancy.  Help for Problem Drinking:   One in every three members of Alcoholics Anonymous (AA) is now a woman.  AA meetings are held regularly and have led the way in demonstrating the effectiveness of self-help.   Outpatient and Inpatient Treatment are also available.  Ask your health care provider for a referral.   The first step in successful treatment is to recognize that there is a problem and accept help.    Dietary and Supplemental Calcium  It is important that you build and protect your bone mass through a program of regular exercise and proper nutrition with adequate amounts of calcium in your diet. Dairy products are considered to be the most important sources of calcium.    Reviewed 09/2010

## 2015-10-24 ENCOUNTER — Ambulatory Visit: Payer: Self-pay | Admitting: Urology

## 2015-10-24 LAB — TRICHOMONAS DNA AMPLIFICATION: Trichomonas DNA amplification: 0

## 2015-10-24 LAB — N. GONORRHOEAE DNA AMPLIFICATION: N. gonorrhoeae DNA Amplification: 0

## 2015-10-24 LAB — CHLAMYDIA PLASMID DNA AMPLIFICATION: Chlamydia Plasmid DNA Amplification: 0

## 2015-10-25 LAB — HPV DNA PROBE WITH CYTOLOGY
HPV Other High Risk: NEGATIVE
HPV Type 16: NEGATIVE
HPV Type 18: NEGATIVE

## 2015-10-28 ENCOUNTER — Telehealth: Payer: Self-pay | Admitting: Internal Medicine

## 2015-10-28 DIAGNOSIS — N319 Neuromuscular dysfunction of bladder, unspecified: Secondary | ICD-10-CM

## 2015-10-28 DIAGNOSIS — G8254 Quadriplegia, C5-C7 incomplete: Secondary | ICD-10-CM

## 2015-10-28 LAB — GYN CYTOLOGY

## 2015-10-28 MED ORDER — NON-SYSTEM MEDICATION *A*
5 refills | Status: DC
Start: 2015-10-28 — End: 2015-12-11

## 2015-10-28 NOTE — Telephone Encounter (Signed)
Cindy Ochoa called to request a prescription for  Leg bags to be sent to [facility]  at [address], [place] [address]  Cindy Ochoa can be reached at [phone] to advise

## 2015-10-29 NOTE — Telephone Encounter (Signed)
Faxed script to [facility] and patient notified.

## 2015-11-05 ENCOUNTER — Ambulatory Visit: Payer: Self-pay | Admitting: Surgery

## 2015-11-11 ENCOUNTER — Ambulatory Visit: Payer: Self-pay | Admitting: Surgery

## 2015-11-13 ENCOUNTER — Encounter: Payer: Self-pay | Admitting: Gastroenterology

## 2015-11-15 ENCOUNTER — Encounter: Payer: Self-pay | Admitting: Obstetrics and Gynecology

## 2015-11-15 ENCOUNTER — Ambulatory Visit: Payer: Self-pay
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
## 2015-11-18 ENCOUNTER — Encounter: Payer: Self-pay | Admitting: Surgery

## 2015-11-18 ENCOUNTER — Ambulatory Visit: Payer: Self-pay | Admitting: Surgery

## 2015-11-18 VITALS — BP 118/72 | HR 105 | Temp 97.9°F | Resp 18 | Ht 64.02 in | Wt 179.0 lb

## 2015-11-18 DIAGNOSIS — L89154 Pressure ulcer of sacral region, stage 4: Secondary | ICD-10-CM

## 2015-11-18 MED ORDER — LIDOCAINE HCL 2 % EX JELLY WRAPPED *I*
CUTANEOUS | Status: AC | PRN
Start: 2015-11-18 — End: 2016-01-17

## 2015-11-18 NOTE — Patient Instructions (Addendum)
Please follow these instructions about how to care for your wound.            Wound Location Dressing Orders Frequency of Dressing Change   Location:sacrum Aquacel packing to wound, cover with ABD pad, secure with hypafix tape [x]  Daily        Cleanse Wound(s) with:   []  Normal Saline  []  Keep Dry    [x]  Soap & Water with dressing changes, keep dressing dry in between          []   Assistive Device(s):   [x]   Wheelchair   []   Walker   []   Cane   []   Crutches      []   Wedge Shoe   []   Other:    [x]   Pressure Reduction:   [x]   Wheelchair Cushion   []   Mattress Overlay      [x]   Specialty Bed   [x]   Reposition every 2 hours         []   Limit time up in your chair to 2 hours or less at a time   []   Other:    []   Home Health:        []   General:    []   Stop / Decrease Smoking   []   Multivitamin   []   Exercise   []   Other:        []   Discharge from [facility] Discharge Instructions    Following the instructions below will give you the best opportunity for wound healing.    Wound Care Instructions (If changing own dressing):   Gather all supplies you will need to change your dressing.   Before changing your dressing, wash your hands for at least 15 seconds with warm soapy water.   Rinse off all soap, then dry with a towel.   Remove the old dressing.  Wash hands again before applying the new dressing.    If you experience any of the following during our business hours of 8 AM - 4:30 PM, Monday - Friday, please call the [facility] at [phone]:    ? Increase in pain  ?Temperature over 101F   ?Drainage with a foul odor   ?Bleeding   ? Increase in swelling  ? Increase in drainage from your wound  ? Need for compression bandage changes (slippage, breakthrough drainage)    Please contact your primary care physician or proceed to the nearest emergency room if you experience any of the above after our business hours.     Please note your appointment(s) above - if you are unable to  keep, kindly give 24 hours' notice.  Thank You!
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
## 2015-11-19 ENCOUNTER — Other Ambulatory Visit: Payer: Self-pay | Admitting: Internal Medicine

## 2015-11-19 MED ORDER — BISACODYL 10 MG RE SUPP *I*
10.0000 mg | Freq: Every day | RECTAL | 3 refills | Status: DC | PRN
Start: 2015-11-19 — End: 2016-03-25

## 2015-11-26 ENCOUNTER — Ambulatory Visit: Payer: Self-pay | Admitting: Urology

## 2015-11-26 ENCOUNTER — Encounter: Payer: Self-pay | Admitting: Urology

## 2015-11-26 ENCOUNTER — Ambulatory Visit
Admission: RE | Admit: 2015-11-26 | Discharge: 2015-11-26 | Disposition: A | Discharge status: Home / Self Care | Payer: Self-pay | Source: Ambulatory Visit | Attending: Urology | Admitting: Urology

## 2015-11-26 VITALS — BP 117/65 | HR 82 | Temp 97.5°F | Ht 64.0 in | Wt 179.0 lb

## 2015-11-26 DIAGNOSIS — N209 Urinary calculus, unspecified: Secondary | ICD-10-CM
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
## 2015-11-27 ENCOUNTER — Encounter: Payer: Self-pay | Admitting: Gastroenterology

## 2015-11-27 ENCOUNTER — Telehealth: Payer: Self-pay

## 2015-11-27 NOTE — Telephone Encounter (Signed)
VM - surgical date is 12/06/15, no blood thinners starting on 11/29/15, instructions were left on vm, a call back to [phone] was asked for.

## 2015-12-03 ENCOUNTER — Other Ambulatory Visit: Payer: Self-pay | Admitting: Internal Medicine

## 2015-12-03 NOTE — Progress Notes (Signed)
Immunizations:  Medicare?   no    Tdap:                               Medicare to local pharmacy                               If > 30 yo   Overdue: Yes        Agrees to schedule:  unknown    *If none Tdap x 1       Order code: [id number]   If Tdap, then Td every 10 years     Order code: [id number]      Information:  For Tdap and Zostavax with Medicare:  Medicare and/or dual eligible patients should be directed to their pharmacy to receive these vaccines (a copay may apply - though usually minimal).    Local pharmacies that have all vaccinations available include:   [facility], [facility], [place], [facility], [place] (dependent upon location)

## 2015-12-05 ENCOUNTER — Encounter: Payer: Self-pay | Admitting: Student in an Organized Health Care Education/Training Program

## 2015-12-05 ENCOUNTER — Ambulatory Visit: Payer: Self-pay | Admitting: Student in an Organized Health Care Education/Training Program

## 2015-12-05 VITALS — BP 118/86 | HR 80 | Temp 98.1°F | Ht 64.02 in | Wt 176.0 lb

## 2015-12-05 DIAGNOSIS — Z0000 Encounter for general adult medical examination without abnormal findings: Secondary | ICD-10-CM

## 2015-12-05 DIAGNOSIS — K5900 Constipation, unspecified: Secondary | ICD-10-CM

## 2015-12-05 DIAGNOSIS — M62838 Other muscle spasm: Secondary | ICD-10-CM

## 2015-12-05 MED ORDER — CYCLOBENZAPRINE HCL 5 MG PO TABS *I*
5.0000 mg | ORAL_TABLET | Freq: Three times a day (TID) | ORAL | 0 refills | Status: DC | PRN
Start: 2015-12-05 — End: 2016-02-06
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
## 2015-12-05 NOTE — Patient Instructions (Signed)
Please pick up medications at the pharmacy.

## 2015-12-05 NOTE — Progress Notes (Addendum)
[facility]: Progress Note    Chief Complaint: Follow-Up    Subjective:      Ms. Chelette is a 29 yo female with hx of quadriparesis at c6, autonomic dysfunction, neurogenic bladder and bowel who presents today for follow-up.  Constipation has been doing well, miralax and percolace have been working well.  Nurses from wound care had asked what they do to calm her legs.  When she is laying down she notices her legs get very jumpy.  She states that the spasms travel up her legs to her stomach and then her hand shakes.  She has been on baclofen in the past which she states didn't work.  She doesn't recall if she has been on flexeril.      Sophey also mentioned to me that she had a fleeting thought about becoming pregnant again because her daughter is 87 and she wanted another baby.  She had considered talking to someone in maternal-fetal medicine about this this but she did not go to that appointment.  She states that her fiance is currently incarcerated in [place] and she is not sexually active at this time.  She is not currently on birth control.  She stated that she is no longer considering this and she would let us know if she was considering this in the future.     Medications:     Current Outpatient Prescriptions on File Prior to Visit   Medication Sig Dispense Refill    bisacodyl (BISAC-EVAC) 10 MG suppository Place 1 suppository (10 mg total) rectally daily as needed   Unwrap first 12 suppository 3    Non-System Medication Urostomy drainage bags change as needed. Dx N39.42 and  G82.54 10 each 5    senna-docusate (PERICOLACE) 8.6-50 MG per tablet Take 2 tablets by mouth daily as needed for Constipation 30 tablet 5    polyethylene glycol (GLYCOLAX,MIRALAX) powder packet Take 17 g by mouth 2 times daily as needed 1020 g 5    MAPAP 500 MG tablet take 1 tablet by mouth every 4 to 6 hours if needed for pain 180 tablet 5    lidocaine (XYLOCAINE) 2 % jelly Apply to wound prior  to packing 85 mL 1    naproxen (ALEVE) 220 MG capsule Take 1 capsule (220 mg total) by mouth every 8 hours as needed for Headaches 30 capsule 1    incontinence supply disposable Large pull ups - use up to 5 x daily  Dx N39.42 150 each 5    disposable gloves 2 boxes Disposable Medium size gloves 200 each 6    disposable gloves 2 boxes Disposable Medium size gloves 200 each 6    disposable underpads 30"x36" (CHUX) Use 6 times daily and PRN. Dx N39.42  Incontinence without sensory awareness 150 each 5    Non-System Medication Shower Chair - Quadriparesis G82.54 1 each 0    Ostomy Supplies (HOLLISTER REPLACEMENT FILTERS) MISC Hollister Adapter (order # [id number]). 1 each 1    ARTIFICIAL TEARS 1.4 % ophthalmic solution PLACE 1 DROP INTO BOTH EYES FOUR TIMES DAILY 15 mL 10    Non-System Medication PRI AND SCREEN  DIAGNOSIS 344.04 Quadriparesis 1 each 0    white petrolatum-mineral oil (GENTEAL PM) 85-15 % ophthalmic ointment Place into both eyes nightly 3.5 mL 6    Skin Protectants, Misc. OINT Apply 1 Tube topically 2 times daily   1 Tube 11    Non-System Medication One Hoyer Lift  Diagnosis:  Quadriplegic  ICD-9 344.04 1 each 0     Current Facility-Administered Medications on File Prior to Visit   Medication Dose Route Frequency Provider Last Rate Last Dose    lidocaine (XYLOCAINE) 2 % jelly   Topical PRN Zollie Beckers, NP           Medications reviewed and reconciled.   Allergies:     Allergies   Allergen Reactions    Nitrofurantoin Nausea And Vomiting    Vancomycin Hives     hives 2006 but tolerated Rx in 2010  pt states she had vancomycin in 04/2012.  Feels this is not true allergy  as she has received it recently - had no reaction     Heparin Other (See Comments)     Thrombocytopenia;        Review of Systems:     Pertinent positives and negatives as per HPI.     Physical Exam:     Vitals:    12/05/15 1341   BP: 118/86   Pulse: 80   Temp: 36.7 C (98.1 F)   TempSrc: Temporal   Weight: 79.8 kg (176 lb)    Height: 1.626 m (5' 4.02")     Wt Readings from Last 3 Encounters:   12/05/15 79.8 kg (176 lb)   11/26/15 81.2 kg (179 lb)   11/18/15 81.2 kg (179 lb)     Body mass index is 30.2 kg/(m^2).  BP Readings from Last 3 Encounters:   12/05/15 118/86   11/26/15 117/65   11/18/15 118/72       General: Well-appearing, NAD, AOx3  HEENT: NCAT  Lymphatics: No LAD  Pulmonary: CTA b/l, Normal WOB  Cardiovascular: RRR, normal S1/S2, no M/R/G  Abdominal: soft, nt, nd, +BS  Extremities: wwp, no edema  Skin: No rashes or lesions    Assessment and Plan:     Ms. Prevette is a 29 yo female with hx of quadriparesis at c6, autonomic dysfunction, neurogenic bladder and bowel who presents today for follow-up.    Constipation     - continue miralax, percolace  - suppositories as needed  - continue regular diet    Leg Spasms    - will restart flexeril 5 mg tid prn    Ob/Gyn    - patient not currently trying to become pregnant, if this changes she will call [place]  - will add birth control if she is sexually active, she isn't not currently    HCM    - Influenza vaccine given today    RTC in 4 months    [name], MD   Internal Medicine, PGY-3    Case discussed with the resident at the time of the visit. I agree with the findings and plan of care as documented by the resident regarding leg spasms for which we will prescribe a muscle relaxant since this has worked in the past and theres is no sign of metabolic disturbance.      Patrici Ranks, M.D.12/12/2016at7:11 PM

## 2015-12-06 ENCOUNTER — Encounter: Payer: Self-pay | Admitting: Urology

## 2015-12-06 ENCOUNTER — Encounter: Payer: Self-pay | Admitting: Anesthesiology

## 2015-12-06 ENCOUNTER — Encounter
Admission: RE | Disposition: A | Discharge status: Home / Self Care | Payer: Self-pay | Source: Ambulatory Visit | Attending: Urology

## 2015-12-06 ENCOUNTER — Ambulatory Visit
Admission: RE | Admit: 2015-12-06 | Discharge: 2015-12-06 | Disposition: A | Discharge status: Home / Self Care | Payer: Self-pay | Source: Ambulatory Visit | Attending: Urology | Admitting: Urology

## 2015-12-06 HISTORY — PX: PR LITHOTRIPSY XTRCORP SHOCK WAVE: 50590

## 2015-12-06 LAB — POCT URINE PREGNANCY: Lot #: 161321

## 2015-12-06 SURGERY — LITHOTRIPSY, ESWL
Anesthesia: General | Laterality: Left

## 2015-12-06 MED ORDER — LIDOCAINE HCL 2 % IJ SOLN *I*
INTRAMUSCULAR | Status: DC | PRN
Start: 2015-12-06 — End: 2015-12-06
  Administered 2015-12-06: 80 mg via INTRAVENOUS

## 2015-12-06 MED ORDER — HYDROCODONE-ACETAMINOPHEN 5-325 MG PO TABS *I*
1.0000 | ORAL_TABLET | ORAL | Status: DC | PRN
Start: 2015-12-06 — End: 2015-12-07

## 2015-12-06 MED ORDER — LACTATED RINGERS IV SOLN *I*
125.0000 mL/h | INTRAVENOUS | Status: DC
Start: 2015-12-06 — End: 2015-12-06
  Administered 2015-12-06: 125 mL/h via INTRAVENOUS

## 2015-12-06 MED ORDER — ONDANSETRON HCL 2 MG/ML IV SOLN *I*
1.0000 mg | Freq: Once | INTRAMUSCULAR | Status: DC | PRN
Start: 2015-12-06 — End: 2015-12-06

## 2015-12-06 MED ORDER — PROPOFOL 10 MG/ML IV EMUL (INTERMITTENT DOSING) WRAPPED *I*
INTRAVENOUS | Status: DC | PRN
Start: 2015-12-06 — End: 2015-12-06
  Administered 2015-12-06: 130 mg via INTRAVENOUS

## 2015-12-06 MED ORDER — ALBUTEROL SULFATE (2.5 MG/3ML) 0.083% IN NEBU *I*
2.5000 mg | INHALATION_SOLUTION | Freq: Once | RESPIRATORY_TRACT | Status: DC | PRN
Start: 2015-12-06 — End: 2015-12-06

## 2015-12-06 MED ORDER — MORPHINE SULFATE 2 MG/ML IV SOLN *WRAPPED*
2.0000 mg | Status: DC | PRN
Start: 2015-12-06 — End: 2015-12-07

## 2015-12-06 MED ORDER — PROPOFOL 10 MG/ML IV EMUL (INTERMITTENT DOSING) WRAPPED *I*
INTRAVENOUS | Status: AC
Start: 2015-12-06 — End: 2015-12-06
  Filled 2015-12-06: qty 20

## 2015-12-06 MED ORDER — MIDAZOLAM HCL 1 MG/ML IJ SOLN *I* WRAPPED
INTRAMUSCULAR | Status: AC
Start: 2015-12-06 — End: 2015-12-06
  Filled 2015-12-06: qty 2

## 2015-12-06 MED ORDER — DEXAMETHASONE SODIUM PHOSPHATE 4 MG/ML INJ SOLN *WRAPPED*
INTRAMUSCULAR | Status: DC | PRN
Start: 2015-12-06 — End: 2015-12-06
  Administered 2015-12-06: 4 mg via INTRAVENOUS

## 2015-12-06 MED ORDER — ONDANSETRON HCL 2 MG/ML IV SOLN *I*
INTRAMUSCULAR | Status: DC | PRN
Start: 2015-12-06 — End: 2015-12-06
  Administered 2015-12-06: 4 mg via INTRAVENOUS

## 2015-12-06 MED ORDER — FENTANYL CITRATE 50 MCG/ML IJ SOLN *WRAPPED*
INTRAMUSCULAR | Status: AC
Start: 2015-12-06 — End: 2015-12-06
  Filled 2015-12-06: qty 2

## 2015-12-06 MED ORDER — MIDAZOLAM HCL 1 MG/ML IJ SOLN *I* WRAPPED
INTRAMUSCULAR | Status: DC | PRN
Start: 2015-12-06 — End: 2015-12-06
  Administered 2015-12-06: 2 mg via INTRAVENOUS

## 2015-12-06 MED ORDER — PROMETHAZINE HCL 25 MG/ML IJ SOLN *I*
12.5000 mg | INTRAMUSCULAR | Status: DC | PRN
Start: 2015-12-06 — End: 2015-12-07

## 2015-12-06 MED ORDER — HYDROCODONE-ACETAMINOPHEN 5-325 MG PO TABS *I*
1.0000 | ORAL_TABLET | Freq: Four times a day (QID) | ORAL | 0 refills | Status: AC | PRN
Start: 2015-12-06 — End: 2015-12-11

## 2015-12-06 MED ORDER — ONDANSETRON HCL 2 MG/ML IV SOLN *I*
INTRAMUSCULAR | Status: AC
Start: 2015-12-06 — End: 2015-12-06
  Filled 2015-12-06: qty 2

## 2015-12-06 MED ORDER — FENTANYL CITRATE 50 MCG/ML IJ SOLN *WRAPPED*
INTRAMUSCULAR | Status: DC | PRN
Start: 2015-12-06 — End: 2015-12-06
  Administered 2015-12-06: 25 ug via INTRAVENOUS
  Administered 2015-12-06: 10:00:00 50 ug via INTRAVENOUS
  Administered 2015-12-06: 11:00:00 25 ug via INTRAVENOUS

## 2015-12-06 MED ORDER — DEXAMETHASONE SODIUM PHOSPHATE 4 MG/ML INJ SOLN *WRAPPED*
INTRAMUSCULAR | Status: AC
Start: 2015-12-06 — End: 2015-12-06
  Filled 2015-12-06: qty 1

## 2015-12-06 MED ORDER — LACTATED RINGERS IV SOLN *I*
125.0000 mL/h | INTRAVENOUS | Status: DC
Start: 2015-12-06 — End: 2015-12-07

## 2015-12-06 MED ORDER — HYDROMORPHONE HCL 2 MG/ML IJ SOLN *WRAPPED*
0.4000 mg | INTRAMUSCULAR | Status: DC | PRN
Start: 2015-12-06 — End: 2015-12-06

## 2015-12-06 MED ORDER — LIDOCAINE HCL 1 % IJ SOLN *I*
0.1000 mL | INTRAMUSCULAR | Status: DC | PRN
Start: 2015-12-06 — End: 2015-12-06

## 2015-12-06 MED ORDER — LIDOCAINE HCL 2 % (PF) IJ SOLN *I*
INTRAMUSCULAR | Status: AC
Start: 2015-12-06 — End: 2015-12-06
  Filled 2015-12-06: qty 5

## 2015-12-06 MED ORDER — PROMETHAZINE HCL 25 MG/ML IJ SOLN *I*
6.2500 mg | Freq: Once | INTRAMUSCULAR | Status: DC | PRN
Start: 2015-12-06 — End: 2015-12-06

## 2015-12-06 NOTE — Anesthesia Postprocedure Evaluation (Signed)
Anesthesia Post-Op Note    Patient: Cindy Ochoa    Procedure(s) Performed:  Procedure Summary     Date Anesthesia Start Anesthesia Stop Room / Location    12/06/15 1023 1126 S_OR_ES / [facility] NON-OR PROCEDURES       Procedure Diagnosis Surgeon Attending Anesthesia    LEFT ESWL (NO KUB) (Left ) Kidney stone  (Kidney stone [N20.0]) Erturk, Allyn Kenner, MD Azell Der, MD        Recovery Vitals  BP: 110/66 (12/06/2015 11:45 AM)  Heart Rate: 82 (12/06/2015 11:45 AM)  Heart Rate (via Pulse Ox): 88 (12/06/2015 11:45 AM)  Resp: 15 (12/06/2015 11:45 AM)  Temp: 36.8 C (98.2 F) (12/06/2015 11:26 AM)  SpO2: 97 % (12/06/2015 11:45 AM)  O2 Device: None (Room air) (12/06/2015 11:45 AM)  O2 Flow Rate: 2 L/min (12/06/2015 11:26 AM)   0-10 Scale: 0 (12/06/2015 11:45 AM)  Anesthesia type:  General  Complications Noted During Procedure or in PACU:  None   Comment:    Patient Location:  PACU  Level of Consciousness:    Recovered to baseline  Patient Participation:     Able to participate  Temperature Status:    Normothermic  Oxygen Saturation:    Within patient's normal range  Cardiac Status:   Within patient's normal range  Fluid Status:    Stable  Airway Patency:     Yes  Pulmonary Status:    Baseline  Pain Management:    Adequate analgesia  Nausea and Vomiting:  None    Post Op Assessment:    Tolerated procedure well   Attending Attestation:  All indicated post anesthesia care provided     -

## 2015-12-06 NOTE — Discharge Instructions (Addendum)
[facility] PATIENT DISCHARGE PLAN      DATE: 01/25/2015     Procedure: ESWL              Physician: ERDAL S ERTURK, MD       FOR AMBULATORY SURGICAL PATIENTS:  You have received sedative medication and/or general anesthesia which may make you drowsy for as long as 24 hours.     A.) DO NOT drive or operate any machinery for 24 hours    B.) DO NOT drink alcoholic beverages for 24 hours    C.) DO NOT make major decisions, sign contracts, etc. for 24 hours    DIET: Resume your previous diet    ACTIVITY: No restrictions    BATHING/SHOWERING: No restrictions    OTHER:    PAIN MANAGEMENT RECOMMENDATION: Assess your level of pain on a scale from 0 to 10.      You should take narcotic pain medication if your pain level is greater than three (3) - be sure to eat prior to taking the medication. If your physician has prescribed non-narcotic pain medication, please take as directed.    FOLLOW-UP CARE  Call for an appointment with Dr. Erturk     Date:01/25/2015     Tel:[phone].    Call your doctor for the following problems: FEVER over 101 F/38.4 C; PAIN not relieved with pain medication ordered; heavy BLEEDING, INABILITY TO URINATE.     If there is a problem call Doctor Erturk      At Tel. # [phone]    If you have difficulty contacting the doctor call [facility] at [phone], weekdays between 8:00 AM and 11:00 PM. At other times, call the Emergency Department at [phone] 24 hours a day.    Additional written instructions provided to patient: no  Specify:     Permission to contact Postop ? yes  Leave Message? yes    Telephone number:

## 2015-12-06 NOTE — Op Note (Signed)
PATIENTSHARONNE, Cindy Ochoa  MR #:  [id number]   ACCOUNT #:  [id number] DOB:  Dec 25, 1986    AGE:  29     SURGEON:  Payton Mccallum, MD  CO-SURGEON:    ASSISTANT:    SURGERY DATE:  12/06/2015    PREOPERATIVE DIAGNOSIS:  Left renal calculus.    POSTOPERATIVE DIAGNOSIS:  Left renal calculus.    OPERATIVE PROCEDURE:  Left ESWL.    ANESTHESIA:  General LMA.    DESCRIPTION OF PROCEDURE:  Patient brought to the ESWL suite, placed on the table in the supine position, general anesthesia administered via laryngeal mask.  Subsequently, the patient was placed over the treatment unit.  The patient's upper pole renal stone was approximately 10 x 8 mm.  This was placed on both focal points.  2500 shocks at 1-7.5 setting.  Fragmentation noted.  The patient tolerated the procedure well, sent to the recovery room with stable vital signs.             ______________________________  Payton Mccallum, MD    ESE/MODL  DD:  12/06/2015 11:18:02  DT:  12/06/2015 12:01:17  Job #:  [id number]    cc:

## 2015-12-06 NOTE — Anesthesia Procedure Notes (Signed)
---------------------------------------------------------------------------------------------------------------------------------------    AIRWAY   GENERAL INFORMATION AND STAFF    Patient location during procedure: OR       Date of Procedure: 12/06/2015 10:46 AM  CONDITION PRIOR TO MANIPULATION     Current Airway/Neck Condition:  Normal        For more airway physical exam details, see Anesthesia PreOp Evaluation  AIRWAY METHOD     Preoxygenated: yes      Induction: IV  Mask Difficulty Assessment:  0 - not attempted    Number of Attempts at Approach:  1  FINAL AIRWAY DETAILS    Final Airway Type:  LMA    Adjunct Airway: soft bite block    Final LMA: classic    LMA Size: 4  ----------------------------------------------------------------------------------------------------------------------------------------

## 2015-12-06 NOTE — H&P (Signed)
UPDATES TO PATIENT'S CONDITION on the DAY OF SURGERY/PROCEDURE    I. Updates to Patient's Condition (to be completed by a provider privileged to complete a H&P, following reassessment of the patient by the provider):    Day of Surgery/Procedure Update:  History  History reviewed and no change    Physical  Full H&P done today; no updates needed.            II. Procedure Readiness   I have reviewed the patient's H&P and updated condition. By completing and signing this form, I attest that this patient is ready for surgery/procedure.    III. Attestation   I have reviewed the updated information regarding the patient's condition and it is appropriate to proceed with the planned surgery/procedure.    Lamaya Hyneman Gilda Crease, MD as of 9:48 AM 12/06/2015

## 2015-12-06 NOTE — Progress Notes (Signed)
Patient arrived via stretcher.  Report obtained from PACU RN. Patient vital signs stable.Patient  alert & oriented X3. Patient denies nausea/vomiting  Denies pain at this time. IV patent.Patient has patent right urostomy that is attached to a foley bag draining clear yellow UOP. Aide called to bedside, is visiting. See flow sheets for further details.

## 2015-12-06 NOTE — INTERIM OP NOTE (Signed)
Interim Op Note (Surgical Log ID: [id number])       Date of Surgery: 12/06/2015       Surgeons: Surgeon(s) and Role:     * Kaina Orengo, Allyn Kenner, MD - Primary       Pre-op Diagnosis: Pre-Op Diagnosis Codes:     * Kidney stone [N20.0]       Post-op Diagnosis: Post-Op Diagnosis Codes:     * Kidney stone [N20.0]       Procedure(s) Performed: Procedure:    LEFT ESWL (NO KUB)  CPT(R) Code:  [id number] - PR FRAGMENT KIDNEY STONE/ ESWL         Additional CPT Codes:        Anesthesia Type: General        Fluid Totals: I/O this shift:  12/09 0700 - 12/09 1459  In: 500 (6.2 mL/kg) [I.V.:500]  Out: - (0 mL/kg)   Net: 500  Weight: 81.3 kg        Estimated Blood Loss: No Data Recorded       Specimens to Pathology:  * No specimens in log *       Temporary Implants:        Packing:                 Patient Condition: good       Findings (Including unexpected complications): none     Signed:  Jacoby Ritsema Gilda Crease, MD  on 12/06/2015 at 11:05 AM

## 2015-12-06 NOTE — Progress Notes (Signed)
ESWL nurse stating that patient had a sacral decubitus with packing that fell out upon transfer, [place] Sherren Kerns made aware.  Pt meeting all PACU discharge criteria, signed out by [facility] MD of anesthesia and transferred to [facility] via stretcher.  Jannette Spanner, RN

## 2015-12-06 NOTE — Progress Notes (Signed)
[id number]  Cindy Ochoa  [id number]  04/14/1986    12/06/2015  Room time in: 10:24  ESWL start time: 10:35  ESWL finish time: 11:12  Room time out: 11:25  Pre-op Diagnosis :left Urolithiasis  Procedures: left eswl  Pre-op assessment :   Vitals:    12/06/15 1005   BP: 110/68   Pulse: 89   Temp: 36.7 C (98.1 F)   Weight: 81.3 kg (179 lb 3.7 oz)   Height: 1.626 m (5' 4" )     BMI : Body mass index is 30.77 kg/(m^2).  Allergies/Sensitivities : Nitrofurantoin; Vancomycin; and Heparin  Patient ID : Verbally  ID Band  Chart  Patient NPO : Yes  Lab Work : Pregnancy test - UPT done 12/06/2015 shows neg  Side confirmed by patient - Yes left  LOC/Emotional status : alert, oriented  Patient teaching : procedure described, questions answered  Participants - Urologist - erturk, Anesthesiologist - kolano, ESWL RN - Nevan Creighton, Resident/CRNA - johnson and RT - hanselman    TIme [id number] obtained  Correct Patient (use 2 identifiers)  Correct procedure   Correct site  Site Marked  yes  Correct patient position  Equipment/Implants available  List participants in time out: Urologist - erturk, [place] and Nurse - Jhamir Pickup  Antibiotics time : n/a  Type of anesthesia : general/LMA  Patient positioning : Supine  Positioning aids: Knee rest  Pillows/Padding    ESWL Parameters : Stone size and Location: 10x21m UPC, Treatment # : one  # Shock waves : 2500, Intensity : 1-7.5, Radiation time : 0:52 sec  Contrast used : no, type: n/a, route : n/a  Catheters : urostomy tube  Type/size: unknown    Discharge assessment: Skin - eswl site intact  Airway status: LMA Discontinued, O2NC and Stable  LOC : Awakening  Follows commands : Yes  Transported with siderails up to : PACU    Patient Instructions:Stone Fragment collection kit given and drs discharge instruction sheet given  Specimen sent : No  Comments :

## 2015-12-06 NOTE — Anesthesia Case Conclusion (Signed)
CASE CONCLUSION  Emergence  Actions:  Suctioned and LMA removed  Criteria Used for Airway Removal:  Adequate Tv & RR, acceptable O2 saturation and following commands  Assessment:  Routine  Transport  Directly to: PACU  Airway:  Nasal cannula  Oxygen Delivery:  4 lpm  Position:  Recumbent  Patient Condition on Handoff  Level of Consciousness:  Moderately sedated  Patient Condition:  Stable  Handoff Report to:  RN

## 2015-12-09 ENCOUNTER — Telehealth: Payer: Self-pay | Admitting: Urology

## 2015-12-09 ENCOUNTER — Encounter: Payer: Self-pay | Admitting: Urology

## 2015-12-09 ENCOUNTER — Telehealth: Payer: Self-pay | Admitting: Internal Medicine

## 2015-12-09 DIAGNOSIS — G8254 Quadriplegia, C5-C7 incomplete: Secondary | ICD-10-CM

## 2015-12-09 DIAGNOSIS — G8250 Quadriplegia, unspecified: Secondary | ICD-10-CM

## 2015-12-09 DIAGNOSIS — N319 Neuromuscular dysfunction of bladder, unspecified: Secondary | ICD-10-CM

## 2015-12-09 NOTE — Telephone Encounter (Signed)
Cindy Ochoa from [employer] is calling to request prescriptions for the following supplies:  Diapers size large, quantity 50.  Underpads quantity 150.  Drainage bags, quantity 10.  One box gloves, size medium.  Diagnosis needs to be on each prescription.  The diagnosis used last time was N39.46

## 2015-12-09 NOTE — Telephone Encounter (Signed)
Patient had ESWL on 12/9 and needs 2 week FUV per discharge AVS.    Nothing available per IDX until 12/27.  Patient is requesting something the week of 12/19.    Please contact her to advise and schedule at [phone].

## 2015-12-10 NOTE — Telephone Encounter (Signed)
Spoke to patient.  Scheduled for 12/24/15

## 2015-12-10 NOTE — Telephone Encounter (Signed)
Patient is calling to check on a status of a post op appointment. Please call her back to discuss at [phone].    Per Message By Ubaldo Glassing.

## 2015-12-11 MED ORDER — DISPOSABLE UNDERPADS 30"X36" MISC *A*
5 refills | Status: DC
Start: 2015-12-11 — End: 2016-07-07

## 2015-12-11 MED ORDER — NON-SYSTEM MEDICATION *A*
5 refills | Status: DC
Start: 2015-12-11 — End: 2016-07-07

## 2015-12-11 MED ORDER — INCONTINENCE SUPPLY DISPOSABLE MISC *A*
5 refills | Status: DC
Start: 2015-12-11 — End: 2016-09-03

## 2015-12-11 MED ORDER — INCONTINENCE SUPPLY DISPOSABLE MISC *A*
5 refills | Status: DC
Start: 2015-12-11 — End: 2015-12-11

## 2015-12-11 MED ORDER — DISPOSABLE UNDERPADS 30"X36" MISC *A*
5 refills | Status: DC
Start: 2015-12-11 — End: 2015-12-11

## 2015-12-11 MED ORDER — NON-SYSTEM MEDICATION *A*
5 refills | Status: DC
Start: 2015-12-11 — End: 2015-12-11

## 2015-12-11 MED ORDER — DISPOSABLE GLOVES MISC *A*
6 refills | Status: DC
Start: 2015-12-11 — End: 2015-12-11

## 2015-12-11 MED ORDER — DISPOSABLE GLOVES MISC *A*
6 refills | Status: DC
Start: 2015-12-11 — End: 2016-07-07

## 2015-12-11 NOTE — Addendum Note (Signed)
Addended by: Lavone Orn on: 12/11/2015 07:52 AM     Modules accepted: Orders

## 2015-12-11 NOTE — Telephone Encounter (Signed)
rx done  Signed and put in [employer].    Dickson, NP

## 2015-12-23 ENCOUNTER — Telehealth: Payer: Self-pay | Admitting: Urology

## 2015-12-23 DIAGNOSIS — N200 Calculus of kidney: Secondary | ICD-10-CM

## 2015-12-23 NOTE — Telephone Encounter (Signed)
SIGN ORDER

## 2015-12-24 ENCOUNTER — Encounter: Payer: Self-pay | Admitting: Urology

## 2015-12-24 ENCOUNTER — Ambulatory Visit
Admission: RE | Admit: 2015-12-24 | Discharge: 2015-12-24 | Disposition: A | Discharge status: Home / Self Care | Payer: Self-pay | Source: Ambulatory Visit | Attending: Urology | Admitting: Urology

## 2015-12-24 ENCOUNTER — Ambulatory Visit: Payer: Self-pay | Admitting: Urology

## 2015-12-24 VITALS — Ht 64.0 in | Wt 179.0 lb

## 2015-12-24 DIAGNOSIS — N200 Calculus of kidney: Secondary | ICD-10-CM
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
## 2015-12-26 ENCOUNTER — Telehealth: Payer: Self-pay | Admitting: Urology

## 2015-12-26 DIAGNOSIS — N390 Urinary tract infection, site not specified: Secondary | ICD-10-CM

## 2015-12-26 NOTE — Telephone Encounter (Signed)
Spoke with patient.  She is having bladder "spasms", dark urine, frequency, painful urination.  UA and C&S ordered so results will be ready for ofv with Dr. [name] next week.  Patient also told to increase her fluid intake to 64-80 oz a day.  She states she is not drinking very much.  I explained that dark urine could be an indicator of not drinking enough.

## 2015-12-26 NOTE — Telephone Encounter (Signed)
Patient requesting the necessary labs be put in the system so she can complete them prior to her visit with Dr.Warren on 1.3.17. Per IDX patient needs urinalysis.

## 2015-12-27 ENCOUNTER — Telehealth: Payer: Self-pay | Admitting: Urology

## 2015-12-27 LAB — STONE ANALYSIS
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
## 2015-12-31 ENCOUNTER — Ambulatory Visit: Payer: Self-pay | Admitting: Urology

## 2016-01-01 NOTE — Telephone Encounter (Signed)
OUR OFFICE HAS TRIED TO REACH THIS PATIENT , PATIENT PHONE IS CURRENTLY OUT OF ORDER. [place]

## 2016-01-06 ENCOUNTER — Telehealth: Payer: Self-pay | Admitting: Internal Medicine

## 2016-01-06 NOTE — Telephone Encounter (Signed)
Patient is coming on 1/11 for her flu shot. She is scheduled for 11:30. Thank you.

## 2016-01-08 ENCOUNTER — Ambulatory Visit
Admission: RE | Admit: 2016-01-08 | Discharge: 2016-01-08 | Disposition: A | Discharge status: Home / Self Care | Payer: Self-pay | Source: Ambulatory Visit | Attending: Urology | Admitting: Urology

## 2016-01-08 ENCOUNTER — Ambulatory Visit: Payer: Self-pay

## 2016-01-08 DIAGNOSIS — N390 Urinary tract infection, site not specified: Secondary | ICD-10-CM

## 2016-01-08 LAB — URINALYSIS WITH MICROSCOPIC
Ketones, UA: NEGATIVE
Nitrite,UA: NEGATIVE
Protein,UA: 30 mg/dL — AB
RBC,UA: 9 /hpf — AB (ref 0–2)
Specific Gravity,UA: 1.013 (ref 1.002–1.030)
WBC,UA: 104 /hpf — ABNORMAL HIGH (ref 0–5)
pH,UA: 6 (ref 5.0–8.0)

## 2016-01-10 LAB — AEROBIC CULTURE

## 2016-01-14 ENCOUNTER — Ambulatory Visit: Payer: Self-pay | Admitting: Urology

## 2016-01-16 ENCOUNTER — Telehealth: Payer: Self-pay | Admitting: Urology

## 2016-01-16 MED ORDER — SULFAMETHOXAZOLE-TRIMETHOPRIM 800-160 MG PO TABS *I*
1.0000 | ORAL_TABLET | Freq: Two times a day (BID) | ORAL | 0 refills | Status: DC
Start: 2016-01-16 — End: 2016-01-29

## 2016-01-16 NOTE — Telephone Encounter (Signed)
Cindy Ochoa,   Please review below and advise, thanks [place]    Specimen Source: Urine (Clean catch, voided, midstream)       Collected: 01/08/2016 11:30 AM       Aerobic culture (AER, Voided Urine)   Order: [id number]   Status:  Final result Visible to patient:  Yes ([place]) Dx:  Urinary tract infection, site unspeci...      8d ago     Aerobic Culture Escherichia coli   Comments: >100,000/ml   Resulting Agency [facility]   Susceptibility    Escherichia coli     AEROBIC CULTURE     Amikacin Sensitive     Ampicillin Resistant     Aztreonam Sensitive     Cefazolin Sensitive     Cefepime Sensitive     Ceftriaxone Sensitive     Ciprofloxacin Resistant     Ertapenem Sensitive     ESBL -     Gentamicin Sensitive     Meropenem Sensitive     Nitrofurantoin Sensitive     Piperacillin/Tazobactam Sensitive     Tobramycin Sensitive     Trimethoprim/Sulfa Sensitive

## 2016-01-16 NOTE — Telephone Encounter (Signed)
Nursing - bactrim sent to pharmacy, bactrim DS BID x 7 days.     Maida Sale, NP  01/16/2016  12:09 PM

## 2016-01-16 NOTE — Telephone Encounter (Signed)
Patient informed of message below. Agreed with plan.  No further questions or concerns at this time.

## 2016-01-16 NOTE — Telephone Encounter (Signed)
Ms. Youngstrom is requesting a return call to review her recent test results.    What type of test was done? Urinalysis, aerobic culture    Where was the test done? [facility]    On what day was the test done? 1/11    Please return the patients call at [phone].

## 2016-01-24 ENCOUNTER — Ambulatory Visit: Payer: Self-pay | Admitting: Internal Medicine

## 2016-01-29 ENCOUNTER — Ambulatory Visit: Payer: Self-pay | Admitting: Internal Medicine

## 2016-01-29 ENCOUNTER — Ambulatory Visit: Payer: Self-pay | Admitting: Physical Medicine and Rehabilitation

## 2016-01-29 DIAGNOSIS — G8253 Quadriplegia, C5-C7 complete: Secondary | ICD-10-CM

## 2016-01-29 DIAGNOSIS — Z5181 Encounter for therapeutic drug level monitoring: Secondary | ICD-10-CM

## 2016-01-29 DIAGNOSIS — R252 Cramp and spasm: Secondary | ICD-10-CM

## 2016-01-29 MED ORDER — DANTROLENE SODIUM 25 MG PO CAPS *I*
ORAL_CAPSULE | ORAL | 0 refills | Status: DC
Start: 2016-01-29 — End: 2016-07-07
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
## 2016-01-29 NOTE — Patient Instructions (Signed)
Dantrium 25 mg once a day for 2 weeks then increase to twice day.   PT for stretching HEP, wheelchair seating improvement  OT for hand splints.   Get blood drawn at [facility] labs for liver enzymes the first week in March.   Call for DEXA scan, bone metabolism clinic follow-up  [phone].   Call Dr. Nash Mantis for an update in 1 month.  Follow up in 3 months.

## 2016-02-03 ENCOUNTER — Ambulatory Visit: Payer: Self-pay | Admitting: Internal Medicine

## 2016-02-06 ENCOUNTER — Ambulatory Visit: Payer: Self-pay | Admitting: Internal Medicine

## 2016-02-06 ENCOUNTER — Encounter: Payer: Self-pay | Admitting: Internal Medicine

## 2016-02-06 ENCOUNTER — Encounter: Payer: Self-pay | Admitting: Gastroenterology

## 2016-02-06 VITALS — BP 100/70 | HR 84 | Temp 97.2°F | Ht 64.02 in | Wt 179.0 lb

## 2016-02-06 DIAGNOSIS — R252 Cramp and spasm: Secondary | ICD-10-CM

## 2016-02-06 DIAGNOSIS — L8993 Pressure ulcer of unspecified site, stage 3: Secondary | ICD-10-CM

## 2016-02-06 DIAGNOSIS — G8254 Quadriplegia, C5-C7 incomplete: Secondary | ICD-10-CM

## 2016-02-06 DIAGNOSIS — N319 Neuromuscular dysfunction of bladder, unspecified: Secondary | ICD-10-CM

## 2016-02-06 DIAGNOSIS — J9500 Unspecified tracheostomy complication: Secondary | ICD-10-CM

## 2016-02-06 NOTE — Progress Notes (Signed)
Reason For Visit: The encounter diagnosis was Tracheostomy complication, unspecified.      HPI:  Cindy Ochoa is 30 y.o. year old female with quadriplegic, autonomic dysfunction, decubitus ulcer on sacral area, depression, headache, muscle spasms, neurogenic bladder, neurogenic bowel     patient is here today for routine follow-up   She need aid paperwork completed    Patient is quadriplegic at C6  She is an ileostomy that is draining clear yellow fluid.  She is followed by urology for kidney stones and frequent UTIs.    Neurogenic bowel   She gets suppositories every day    she is now placed on commode they'll facilitate bowel movements      patient was previously tracheotomy  She still has open area in her neck that never closed up  She would like to get it fixed    Patient has her aids who drives her around  She is trying to learn how to drive herself  She plans on working part-time at [employer] to help patient with similar issues   She needs a flu shot       She was recently seen by PMR   She was started on dantrium for spasms  Patient is uncertain if it is helping yet    She is followed by wound care for her sacral decubitus    Hoyer lift  Motorized wheelchair  She has 129 hours of 8 service weekly    Patient is here with her mother today      Medications:     Current Outpatient Prescriptions   Medication Sig Note    dantrolene (DANTRIUM) 25 MG capsule 1 tablet orally once a day for 2 weeks then increase to 1 caps twice a day.     Non-System Medication Urostomy drainage bags change as needed. Dx N39.42 and  G82.54     incontinence supply disposable Large pull ups - use up to 5 x daily  Dx N39.42     disposable underpads 30"x36" (CHUX) Use 6 times daily and PRN. Dx N39.42  Incontinence without sensory awareness     disposable gloves 2 boxes Disposable Medium size gloves     cyclobenzaprine (FLEXERIL) 5 MG tablet Take 1 tablet (5 mg total) by mouth 3 times daily as needed for Muscle spasms 12/06/2015: Patein t  states just prescribed yestarday- hasn't taken yet    bisacodyl (BISAC-EVAC) 10 MG suppository Place 1 suppository (10 mg total) rectally daily as needed   Unwrap first (Patient taking differently: Place 10 mg rectally every other day   Unwrap first)     senna-docusate (PERICOLACE) 8.6-50 MG per tablet Take 2 tablets by mouth daily as needed for Constipation     polyethylene glycol (GLYCOLAX,MIRALAX) powder packet Take 17 g by mouth 2 times daily as needed     MAPAP 500 MG tablet take 1 tablet by mouth every 4 to 6 hours if needed for pain     lidocaine (XYLOCAINE) 2 % jelly Apply to wound prior to packing     disposable gloves 2 boxes Disposable Medium size gloves     Non-System Medication Shower Chair - Quadriparesis G82.54     Ostomy Supplies (HOLLISTER REPLACEMENT FILTERS) MISC Hollister Adapter (order # [phone]).     ARTIFICIAL TEARS 1.4 % ophthalmic solution PLACE 1 DROP INTO BOTH EYES FOUR TIMES DAILY     Non-System Medication PRI AND SCREEN  DIAGNOSIS 344.04 Quadriparesis     white petrolatum-mineral oil (GENTEAL PM) 85-15 %  ophthalmic ointment Place into both eyes nightly     Skin Protectants, Misc. OINT Apply 1 Tube topically 2 times daily       Non-System Medication One Hoyer Lift  Diagnosis:  Quadriplegic ICD-9 344.04      No current facility-administered medications for this visit.        Medication list reconciled this visit    Allergies:     Allergies   Allergen Reactions    Nitrofurantoin Nausea And Vomiting    Vancomycin Hives     hives 2006 but tolerated Rx in 2010  pt states she had vancomycin in 04/2012.  Feels this is not true allergy  as she has received it recently - had no reaction     Heparin Other (See Comments)     Thrombocytopenia;        Social history      Social History   Substance Use Topics    Smoking status: Current Every Day Smoker     Packs/day: 0.50     Years: 3.00     Types: Cigars     Start date: 12/09/2011    Smokeless tobacco: Former Systems developer     Quit date: 04/29/2005       Comment: 1 black and mild a day    Alcohol use No       Review of Systems     Review of Systems   Constitutional: Negative for fever.   HENT: Negative for congestion.    Respiratory: Negative for cough and wheezing.    Cardiovascular: Negative for chest pain.   Gastrointestinal: Negative for abdominal pain.   Genitourinary: Negative.    Neurological: Negative for sensory change and headaches.            Physical Exam:     Physical Exam     General: A motorized wheelchair  Neck: Still air leak at old trach stoma.   Cardiovascular: RRR  Pulmonary: CTAB  Abdomen: soft, ileostomy intact  Yellow urine in bag         Vitals:    02/06/16 1123   BP: 100/70   Pulse: 84   Temp: 36.2 C (97.2 F)   TempSrc: Temporal   Weight: 81.2 kg (179 lb)   Height: 1.626 m (5' 4.02")     Wt Readings from Last 3 Encounters:   02/06/16 81.2 kg (179 lb)   12/24/15 81.2 kg (179 lb)   12/06/15 81.3 kg (179 lb 3.7 oz)     BP Readings from Last 3 Encounters:   02/06/16 100/70   12/06/15 119/74   12/05/15 118/86               ASSESSMENT/PLAN:       Tracheostomy complication, unspecified  -     AMB REFERRAL TO ENT    Health maintenance  -     Flu vaccine quadrivalent greater than or equal to 3yo with preservative IM    Pressure ulcer on sacrum         continue with wound care    Neurogenic bladder         ileostomy is working without difficulty         No signs of UTI or kidney stones         Keep  follow-up with urology    Spasticity           Continue follow-up with PMR    Greater than 50% of this 30 minute visit was spent  on education, counseling, or coordinating care of the patient's problems as indicated in my assessment and plan.           Follow up: to 3 months     Lavone Orn,  Nurse Practitioner  [facility] Internal  Medicine

## 2016-02-11 ENCOUNTER — Ambulatory Visit: Payer: Self-pay | Admitting: Rehabilitative and Restorative Service Providers"

## 2016-02-11 DIAGNOSIS — S14105A Unspecified injury at C5 level of cervical spinal cord, initial encounter: Secondary | ICD-10-CM

## 2016-02-11 NOTE — Progress Notes (Signed)
Physical Medicine & Rehabilitation  Occupational Therapy   Physical Medicine & Rehabilitation  Occupational Therapy  Splint Documentation        01/29/2016 Mariea Clonts, splint check        Name:  Cindy Ochoa  MRN:  [id number]  Diagnosis:    Encounter Diagnosis   Name Primary?    Spinal cord injury at C5-C7 level without injury of spinal bone, initial encounter Yes     Today's Date:  02/11/2016  Aide present with permission.   Name:  Cindy Ochoa  MRN:  [id number]  Diagnosis:    Encounter Diagnosis   Name Primary?    Spinal cord injury at C5-C7 level without injury of spinal bone, initial encounter Yes     Today's Date:  02/11/2016    Orthotic Training:  Designed and fabricated custom thermoplastic splint.    Affected Body Part:  Left, Hand      Splint Type:Resting hand splint with wrist neutral, MP and IP in slight flexion, slight tension on IPs.      Wearing Schedule: Night only following successful daytime wear.      Patient Education:  Instructed patient in splint care, wear & precautions, Patient demonstrated ability to donn and doff splint with aide dependent asssit, Patient demonstrated understanding of instructions   Fit Right hand with rolled washcloth with coban at ulnar tail. educated in use and fitting.   Assessment:  Splint fits well    Plan:  No futher OT planned     Minutes:  Total timed: 55     Timed mins: 0   Untimed: orthotic training  55 min  Unbillable: 0

## 2016-02-11 NOTE — Progress Notes (Signed)
02/11/16 1600   Charges   Visit Charges Orthotic Training - code 0810 (60 minutes);Resting Hand Splint - code 2620 (wrist)

## 2016-02-13 ENCOUNTER — Ambulatory Visit: Payer: Self-pay | Admitting: Facial Plastic Surgery

## 2016-02-13 DIAGNOSIS — J9504 Tracheo-esophageal fistula following tracheostomy: Secondary | ICD-10-CM

## 2016-02-13 HISTORY — DX: Tracheo-esophageal fistula following tracheostomy: J95.04
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
## 2016-02-17 ENCOUNTER — Ambulatory Visit: Payer: Self-pay | Admitting: Surgery

## 2016-02-17 ENCOUNTER — Encounter: Payer: Self-pay | Admitting: Surgery

## 2016-02-17 VITALS — BP 136/81 | HR 77 | Temp 98.0°F | Ht 65.0 in | Wt 161.0 lb

## 2016-02-17 DIAGNOSIS — L89154 Pressure ulcer of sacral region, stage 4: Secondary | ICD-10-CM
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
## 2016-02-17 NOTE — Patient Instructions (Signed)
Please follow these instructions about how to care for your wound.         Wound Location Dressing Orders Frequency of Dressing Change   Location:sacrum Aquacel packing to wound, cover with ABD pad, secure with hypafix tape [x]  Daily      Cleanse Wound(s) with:     [x]  Soap & Water with dressing changes, keep dressing dry in between       []  Assistive Device(s):  [x]  Wheelchair []  Walker []  Cane []  Crutches   []  Wedge Shoe []  Other:    [x]  Pressure Reduction:  [x]  Wheelchair Cushion []  Mattress Overlay   [x]  Specialty Bed [x]  Reposition every 2 hours   []  Limit time up in your chair to 2 hours or less at a time  []  Other:   []  Home Health:     []  General:  []  Stop / Decrease Smoking []  Multivitamin []  Exercise []  Other:       []  Discharge from [facility] Discharge Instructions    Following the instructions below will give you the best opportunity for wound healing.    Wound Care Instructions (If changing own dressing):  Gather all supplies you will need to change your dressing.  Before changing your dressing, wash your hands for at least 15 seconds with warm soapy water.  Rinse off all soap, then dry with a towel.  Remove the old dressing. Wash hands again before applying the new dressing.    If you experience any of the following during our business hours of 8 AM - 4:30 PM, Monday - Friday, please call the [facility] at [phone]:    ? Increase in pain ?Temperature over 101F ?Drainage with a foul odor ?Bleeding ? Increase in swelling ? Increase in drainage from your wound ? Need for compression bandage changes (slippage, breakthrough drainage)    Please contact your primary care physician or proceed to the nearest emergency room if you experience any of the above after our business hours.     Please note your appointment(s) above - if you are unable to keep, kindly give 24 hours' notice. Thank You!

## 2016-02-20 ENCOUNTER — Ambulatory Visit: Payer: Self-pay

## 2016-02-20 ENCOUNTER — Telehealth: Payer: Self-pay | Admitting: Urology

## 2016-02-20 DIAGNOSIS — N390 Urinary tract infection, site not specified: Secondary | ICD-10-CM

## 2016-02-20 NOTE — Telephone Encounter (Signed)
[facility] called to ask Dr. Jorene Guest for an order for a urine test. She stated she is experiencing chills, odorous urine, and has brown urine. These symptoms started yesterday. She stated her frequency hasn't changed. She stated she is quadriplegic so isn't feeling dysuria. She denied fever and chills.    Cindy Ochoa stated she would go to the [facility] lab. Please call her back to confirm at [phone].

## 2016-02-20 NOTE — Telephone Encounter (Signed)
Cindy Ochoa,   See the message below.   Orders entered for urine culture and urinalysis.   Patient notified to go to a [facility] lab to drop off a sample.       The [phone] number stated "no longer in service".  Please confirm her cell number the next time she calls the office.

## 2016-02-21 ENCOUNTER — Ambulatory Visit
Admission: RE | Admit: 2016-02-21 | Discharge: 2016-02-21 | Disposition: A | Discharge status: Home / Self Care | Payer: Self-pay | Source: Ambulatory Visit | Attending: Urology | Admitting: Urology

## 2016-02-21 DIAGNOSIS — R252 Cramp and spasm: Secondary | ICD-10-CM

## 2016-02-21 DIAGNOSIS — Z5181 Encounter for therapeutic drug level monitoring: Secondary | ICD-10-CM

## 2016-02-21 DIAGNOSIS — N390 Urinary tract infection, site not specified: Secondary | ICD-10-CM

## 2016-02-21 DIAGNOSIS — G8253 Quadriplegia, C5-C7 complete: Secondary | ICD-10-CM

## 2016-02-21 LAB — URINALYSIS WITH MICROSCOPIC
Hyaline Casts,UA: 1 /lpf (ref 0–2)
Ketones, UA: NEGATIVE
Nitrite,UA: POSITIVE — AB
Protein,UA: 100 mg/dL — AB
RBC,UA: 10 /hpf — AB (ref 0–2)
Specific Gravity,UA: 1.013 (ref 1.002–1.030)
WBC,UA: 71 /hpf — ABNORMAL HIGH (ref 0–5)
pH,UA: 6 (ref 5.0–8.0)

## 2016-02-21 LAB — HEPATIC FUNCTION PANEL
ALT: 11 U/L (ref 0–35)
AST: 14 U/L (ref 0–35)
Albumin: 3.9 g/dL (ref 3.5–5.2)
Alk Phos: 53 U/L (ref 35–105)
Bilirubin,Direct: <0.2 mg/dL (ref 0.0–0.3)
Bilirubin,Total: 0.3 mg/dL (ref 0.0–1.2)
Total Protein: 6.8 g/dL (ref 6.3–7.7)

## 2016-02-21 LAB — MULTIPLE ORDERING DOCS

## 2016-02-23 LAB — AEROBIC CULTURE

## 2016-02-24 ENCOUNTER — Telehealth: Payer: Self-pay | Admitting: Physical Medicine and Rehabilitation

## 2016-02-24 ENCOUNTER — Telehealth: Payer: Self-pay | Admitting: Urology

## 2016-02-24 ENCOUNTER — Telehealth: Payer: Self-pay

## 2016-02-24 DIAGNOSIS — R252 Cramp and spasm: Secondary | ICD-10-CM

## 2016-02-24 DIAGNOSIS — S14109S Unspecified injury at unspecified level of cervical spinal cord, sequela: Secondary | ICD-10-CM

## 2016-02-24 DIAGNOSIS — G8254 Quadriplegia, C5-C7 incomplete: Secondary | ICD-10-CM

## 2016-02-24 MED ORDER — CEPHALEXIN 500 MG PO CAPS *I*
500.0000 mg | ORAL_CAPSULE | Freq: Two times a day (BID) | ORAL | 0 refills | Status: DC
Start: 2016-02-24 — End: 2016-04-10

## 2016-02-24 NOTE — Telephone Encounter (Signed)
Spoke with patient.

## 2016-02-24 NOTE — Telephone Encounter (Signed)
Nursing - please notify pt that keflex 500mg  pO BID x 7 days sent into pharmacy.     Maida Sale, NP  02/24/2016  4:02 PM

## 2016-02-24 NOTE — Telephone Encounter (Signed)
Sent to Dr.Nickels

## 2016-02-24 NOTE — Telephone Encounter (Signed)
Ms. Golen is calling to cancel her appointment which is currently scheduled for today @ 1:00 pm.    Has the appointment been cancelled? yes    Has the appointment been rescheduled? no    Does the patient need a call back to reschedule?no

## 2016-02-24 NOTE — Telephone Encounter (Signed)
Patient called and advised to start antibiotics

## 2016-02-24 NOTE — Telephone Encounter (Signed)
Patient needs updated referrals for physical and occupational therapy placed on E record so she can schedule appointment.  Please advise

## 2016-02-25 ENCOUNTER — Ambulatory Visit: Payer: Self-pay

## 2016-02-26 ENCOUNTER — Telehealth: Payer: Self-pay | Admitting: Internal Medicine

## 2016-02-26 NOTE — Telephone Encounter (Signed)
Cindy Ochoa, patients mother, is requesting a form from patients Doctor stating that Cindy Ochoa is the main care taker and how long she has been taking care of her.     Cindy Ochoa will be in office on 3/6 and would like to pick it up them.    This form is for [employer].    Cindy Ochoa does not need a call back, but can be reached if necessary at [phone]

## 2016-02-26 NOTE — Telephone Encounter (Signed)
Left message for Cindy Ochoa to give me a call back - not sure what form she is referring to.
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
## 2016-03-02 ENCOUNTER — Telehealth: Payer: Self-pay | Admitting: Internal Medicine

## 2016-03-02 NOTE — Telephone Encounter (Signed)
Writer called pt and left message.  Immunzation record sent to pt with proof of flu shot given on 02/06/2016.

## 2016-03-02 NOTE — Telephone Encounter (Signed)
Patient needs proof mailed to her that she had flu shot 02/06/16.

## 2016-03-02 NOTE — Telephone Encounter (Signed)
Patient calling to request a letter stating her last flu shot be mailed to her home address listed. Patient can be reached at [phone].

## 2016-03-13 ENCOUNTER — Ambulatory Visit: Payer: Self-pay

## 2016-03-18 ENCOUNTER — Ambulatory Visit: Payer: Self-pay | Admitting: Ophthalmology

## 2016-03-19 ENCOUNTER — Encounter: Payer: Self-pay | Admitting: Student in an Organized Health Care Education/Training Program

## 2016-03-19 NOTE — Telephone Encounter (Signed)
Spoke with Cindy Ochoa regarding her care.  She states that she does have aid services and lives primarily on her own, but she does hire her mother for help and states that her mother is paid through the county.  I explained to [name] that in this office we typically do not write letters to the IRS regarding patient care.  I explained that if she hired her mother and is concerned about payment/taxes she would have to talk to an Optometrist or other professional who handles tax/IRS related matters.  She explained that she understood.

## 2016-03-19 NOTE — Telephone Encounter (Signed)
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

## 2016-03-23 ENCOUNTER — Ambulatory Visit
Admission: RE | Admit: 2016-03-23 | Discharge: 2016-03-23 | Disposition: A | Discharge status: Home / Self Care | Payer: Self-pay | Source: Ambulatory Visit | Attending: Internal Medicine | Admitting: Internal Medicine

## 2016-03-23 ENCOUNTER — Encounter: Payer: Self-pay | Admitting: Internal Medicine

## 2016-03-23 ENCOUNTER — Ambulatory Visit: Payer: Self-pay | Admitting: Internal Medicine

## 2016-03-23 VITALS — BP 96/60 | HR 76 | Temp 96.6°F | Ht 65.0 in

## 2016-03-23 DIAGNOSIS — S62307A Unspecified fracture of fifth metacarpal bone, left hand, initial encounter for closed fracture: Secondary | ICD-10-CM

## 2016-03-23 MED ORDER — GENERIC DME *A*
0 refills | Status: DC
Start: 2016-03-23 — End: 2016-09-03

## 2016-03-23 MED ORDER — NAPROXEN SODIUM 220 MG PO TABS *I*
220.0000 mg | ORAL_TABLET | Freq: Two times a day (BID) | ORAL | 0 refills | Status: DC
Start: 2016-03-23 — End: 2016-07-08

## 2016-03-23 NOTE — Progress Notes (Addendum)
[facility]: Acute Visit Note    HPI/ROS:      Cindy Ochoa is a 30 y.o. female who presents to clinic today for evaluation of left fifth digit pain with a PMH significant for quadriplegia at C6 c/b autonomic dysfunction, neurogenic bladder and bowel s/p ileostomy, muscle spasm, and sacral decubitus ulcer.    Patient states that she was getting out of her bathroom several days ago when her left fifth digit got stuck in the side of the bathroom door on leaving. She denies hearing a "crack" or injury afterwards, but does report worsening pain, swelling, and redness in that digit since the time of the injury. She has been using ice and "some friends' pain medication" for the pain as it is quite severe. Not taking NSAIDs or Tylenol for pain at all. She notes that extending the fingers out a bit can help wit the pain significantly. No injury to the wrist and elbow during the event. Otherwise denies any fever/chills, chest pain, or shortness of breath. Feeling well otherwise. No further concerns at this time.    Medications:     Current Outpatient Prescriptions on File Prior to Visit   Medication Sig Dispense Refill    cephalexin (KEFLEX) 500 MG capsule Take 1 capsule (500 mg total) by mouth 2 times daily 14 capsule 0    dantrolene (DANTRIUM) 25 MG capsule 1 tablet orally once a day for 2 weeks then increase to 1 caps twice a day. 44 capsule 0    Non-System Medication Urostomy drainage bags change as needed. Dx N39.42 and  G82.54 10 each 5    incontinence supply disposable Large pull ups - use up to 5 x daily  Dx N39.42 150 each 5    disposable underpads 30"x36" (CHUX) Use 6 times daily and PRN. Dx N39.42  Incontinence without sensory awareness 150 each 5    disposable gloves 2 boxes Disposable Medium size gloves 200 each 6    bisacodyl (BISAC-EVAC) 10 MG suppository Place 1 suppository (10 mg total) rectally daily as needed   Unwrap first (Patient taking differently: Place 10 mg rectally every other day    Unwrap first) 12 suppository 3    senna-docusate (PERICOLACE) 8.6-50 MG per tablet Take 2 tablets by mouth daily as needed for Constipation 30 tablet 5    polyethylene glycol (GLYCOLAX,MIRALAX) powder packet Take 17 g by mouth 2 times daily as needed 1020 g 5    MAPAP 500 MG tablet take 1 tablet by mouth every 4 to 6 hours if needed for pain 180 tablet 5    lidocaine (XYLOCAINE) 2 % jelly Apply to wound prior to packing 85 mL 1    disposable gloves 2 boxes Disposable Medium size gloves 200 each 6    Non-System Medication Shower Chair - Quadriparesis G82.54 1 each 0    Ostomy Supplies (HOLLISTER REPLACEMENT FILTERS) MISC Hollister Adapter (order # [id number]). 1 each 1    ARTIFICIAL TEARS 1.4 % ophthalmic solution PLACE 1 DROP INTO BOTH EYES FOUR TIMES DAILY 15 mL 10    Non-System Medication PRI AND SCREEN  DIAGNOSIS 344.04 Quadriparesis 1 each 0    white petrolatum-mineral oil (GENTEAL PM) 85-15 % ophthalmic ointment Place into both eyes nightly 3.5 mL 6    Skin Protectants, Misc. OINT Apply 1 Tube topically 2 times daily   1 Tube 11    Non-System Medication One Hoyer Lift  Diagnosis:  Quadriplegic ICD-9 344.04 1 each 0     No  current facility-administered medications on file prior to visit.        Allergies:     Allergies   Allergen Reactions    Nitrofurantoin Nausea And Vomiting    Vancomycin Hives     hives 2006 but tolerated Rx in 2010  pt states she had vancomycin in 04/2012.  Feels this is not true allergy  as she has received it recently - had no reaction     Heparin Other (See Comments)     Thrombocytopenia;        Physical Exam:     Vitals:    03/23/16 1111   BP: 96/60   Pulse: 76   Temp: 35.9 C (96.6 F)   TempSrc: Temporal   Height: 1.651 m (5\' 5" )     General: Pleasant, conversant. No distress. In motorized wheelchair.  HEENT: Moist mucus membranes.  Pulmonary: Breathing comfortably. Lungs CTAB.   Cardiovascular: RRR. No murmurs, rubs, or gallops.  Abdominal: Soft. Non-distended. Non-tender.  Bowel sounds present.  Extremities: Left fifth digit is swollen and has significant tenderness with palpation over the MCP joint and proximal left fifth phalanx. There is limited ROM (though patient with chronic flexion contractures of the digits of both hands). There is normal extension/flexion at the wrists bilaterally. No pain with palpation over the elbow joint.     Assessment and Plan:     Cindy Ochoa is a 30 y.o. female who presents today for evaluation of left fifth digit injury with PMH of C6 quadriplegia c/b autonomic instability and neurogenic bowel/bladder and wheelchair dependent    #Fracture left fifth proximal phalanx: XR imaging consistent with minimally displaced fracture through the proximal head of the left fifth phalanx. Given possible involvement of the joint capsule, will make referral to Orthopedic Surgery for possible need for surgical intervention. Otherwise recommended conservative therapy with ice, NSAIDS/APAP for pain, and splint placement.  - Rx for left fifth digit splint was placed, patient to take to a medical supply store  - Referral to Orthopedic Surgery placed, patient informed of diagnosis and need for their evaluation  - Naproxen 220 mg BID for pain, encouraged to continue APAP 1000 mg TID PRN  - Use ice as needed as well    Follow up as needed for reevaluation.     Elroy Channel, MD 03/23/2016 1:17 PM    Patient's care discussed with resident at time of visit.  Agree with plans regarding left fifth finger fracture.    Electronically signed by Jake Samples. Cozetta Seif, MD 03/23/2016 at 1:58 PM

## 2016-03-23 NOTE — Patient Instructions (Signed)
-   For pain, use ice and take Tylenol 1000 mg three times a day and Naproxen 220 mg twice a day.  - We will call you with x-ray results regarding the left little finger  -

## 2016-03-24 ENCOUNTER — Telehealth: Payer: Self-pay | Admitting: Internal Medicine

## 2016-03-24 NOTE — Telephone Encounter (Signed)
Left message with patient regarding appointment scheduled with Hand Ortho Surgery for 3/31 at 10:00 AM at [facility] [place]. She will be seeing Dr. Fredrich Romans who she has seen in the past for hand issues.     If patient calls back, please confirm appointment time with her as we would like her to be seen as soon as possible.    Lady Saucier, MD  Internal Medicine PGY-1  03/24/16 at 1:58 PM

## 2016-03-25 ENCOUNTER — Other Ambulatory Visit: Payer: Self-pay | Admitting: Internal Medicine

## 2016-03-25 NOTE — Telephone Encounter (Signed)
Refilled

## 2016-03-26 ENCOUNTER — Telehealth: Payer: Self-pay | Admitting: Internal Medicine

## 2016-03-26 NOTE — Telephone Encounter (Signed)
Patient called stating the Aleve and Tylenol are not working for her hand pain. Patient would like a call back from the doctor to discuss her other options at [phone]

## 2016-03-26 NOTE — Telephone Encounter (Signed)
Cindy Ochoa saw Dr. Marinda Elk earlier this week regarding her finger injury.  She is having significant pain  that is not responding to Aleve or Tylenol.  She is scheduled for Orthopedic appointment tomorrow but needs to know what she can do for the pain now.    She has not been able to find a store that has a finger splint like Dr. Marinda Elk had recommended.

## 2016-03-26 NOTE — Telephone Encounter (Signed)
Spoke to pt who continues to reports pain at finger.   She reports tylenol and naproxen aren't helping.   She denies pain, swelling, and tingling being worse.   Pt requesting pain medication.  Advised pt to keep scheduled appt with ortho clinic for 03/27/16 at10am.   Pt agreed with plan of care.

## 2016-03-27 ENCOUNTER — Ambulatory Visit: Payer: Self-pay | Admitting: Rehabilitative and Restorative Service Providers"

## 2016-03-27 ENCOUNTER — Encounter: Payer: Self-pay | Admitting: Orthopedic Surgery

## 2016-03-27 ENCOUNTER — Ambulatory Visit: Payer: Self-pay | Admitting: Orthopedic Surgery

## 2016-03-27 VITALS — BP 126/80 | Ht 65.0 in | Wt 161.0 lb

## 2016-03-27 DIAGNOSIS — S62501D Fracture of unspecified phalanx of right thumb, subsequent encounter for fracture with routine healing: Secondary | ICD-10-CM

## 2016-03-27 DIAGNOSIS — S62647A Nondisplaced fracture of proximal phalanx of left little finger, initial encounter for closed fracture: Secondary | ICD-10-CM

## 2016-03-27 NOTE — Progress Notes (Signed)
Pt was referred to clinic for op ulnar gutter splint with buddy tape. Upon check in, pt was notified by our front office staff to wait in our waiting room as it may take up to 30 minutes before a therapist could see her.  Pt was upset and responded that she was hungry and was going up the street for something to eat.  [place] Glass blower/designer tried to intervene and asked her not to leave until she spoke with a therapist to see if it would be safe to leave without splint.  [place] Glass blower/designer found pt outside near the front door to ask her to stay in the building or even try the cafe located on the first floor.  Pt responded by telling manager to "fuck off" and "you can take that fucking splint and shove it up your ass".  Pt then left against medical advice and splint.  Pt has not been contacted and she has not tried to call office to schedule an appt.    Eden Lathe OT

## 2016-03-27 NOTE — Progress Notes (Signed)
New Visit:  Cindy Ochoa    Diagnosis:   Left small finger proximal phalanx head fracture    Handedness:  Left Handed    HISTORY:   Cindy Ochoa is a 30 year old quadriplegic who is left-hand dominant.  She works as a Musician.  She seen the office today following an injury she sustained on 03/20/16.  She was trying to pull herself when her left small finger was caught around a door.  She felt some discomfort and subsequently was evaluated by her primary care physician who ordered radiographs.  There is noted to be a fracture and she is now seen for further management of this.  She has pain which she rates as 10 on a scale of 0-10.  She is not able to use her hand functionally in the fingers do not move at baseline.      PHYSICAL EXAM:  Vital Signs:    Visit Vitals    BP 126/80    Ht 1.651 m (5\' 5" )    Wt 73 kg (161 lb)    BMI 26.79 kg/m2     General Appearance:  Alert and oriented to person, place, and time.  Affect:  Normal.  Skin- Normal  She has mild swelling throughout the left small finger.  Her sensation is subjectively decreased to light touch.  Her digits have good capillary refill.  She has limited flexion and extension of the fingers.    IMAGING:  Radiographs of her left little finger dated 03/23/16 demonstrate nondisplaced fracture of the head of the proximal phalanx along the radial aspect.        Impression/PLAN:   Left small finger proximal phalanx fracture from 03/20/16.  She has limited use of the hand.  We will buddy tape the ring and small fingers and I'll have her see the therapist for a protective hand-based ulnar gutter splint.  We'll see her back in 3-4 weeks for follow-up.  If there any questions prior to her return, she will contact us.     ORDERs NEXT VISIT:  PA, lateral, oblique      This note has been dictated using Systems analyst.

## 2016-03-27 NOTE — Progress Notes (Signed)
[facility] OT CHARGE:   Evaluation:  No Charge

## 2016-04-03 ENCOUNTER — Ambulatory Visit: Payer: Self-pay | Admitting: Internal Medicine

## 2016-04-06 ENCOUNTER — Telehealth: Payer: Self-pay | Admitting: Internal Medicine

## 2016-04-06 NOTE — Telephone Encounter (Signed)
Patient states she would like a copy of the letter doctor wrote back in June. She states that she used the letter to travel and that the letter states she is a quadriplegic and has to use a wheelchair.    She states she can pick up copy. Please call when letter is ready. She can be reached at [phone].

## 2016-04-07 ENCOUNTER — Ambulatory Visit: Payer: Self-pay | Admitting: Internal Medicine

## 2016-04-07 NOTE — Telephone Encounter (Signed)
Left message  -  Letter ready at front desk for pick up.

## 2016-04-09 ENCOUNTER — Ambulatory Visit: Payer: Self-pay

## 2016-04-10 ENCOUNTER — Ambulatory Visit: Payer: Self-pay | Admitting: Internal Medicine

## 2016-04-10 ENCOUNTER — Encounter: Payer: Self-pay | Admitting: Internal Medicine

## 2016-04-10 ENCOUNTER — Telehealth: Payer: Self-pay

## 2016-04-10 VITALS — BP 118/68 | HR 88 | Temp 95.4°F | Ht 65.0 in

## 2016-04-10 DIAGNOSIS — F3289 Other specified depressive episodes: Secondary | ICD-10-CM

## 2016-04-10 DIAGNOSIS — K5900 Constipation, unspecified: Secondary | ICD-10-CM

## 2016-04-10 DIAGNOSIS — G8254 Quadriplegia, C5-C7 incomplete: Secondary | ICD-10-CM

## 2016-04-10 DIAGNOSIS — S62307A Unspecified fracture of fifth metacarpal bone, left hand, initial encounter for closed fracture: Secondary | ICD-10-CM

## 2016-04-10 DIAGNOSIS — J9500 Unspecified tracheostomy complication: Secondary | ICD-10-CM

## 2016-04-10 MED ORDER — BISACODYL 10 MG RE SUPP *I*
RECTAL | 3 refills | Status: DC
Start: 2016-04-10 — End: 2016-10-27

## 2016-04-10 MED ORDER — MIRTAZAPINE 7.5 MG PO TABS *A*
7.5000 mg | ORAL_TABLET | Freq: Every evening | ORAL | 1 refills | Status: DC
Start: 2016-04-10 — End: 2016-05-12

## 2016-04-10 MED ORDER — POLYETHYLENE GLYCOL 3350 PO POWD *I*
17.0000 g | Freq: Every day | ORAL | 1 refills | Status: DC
Start: 2016-04-10 — End: 2017-09-17

## 2016-04-10 MED ORDER — SENNOSIDES-DOCUSATE SODIUM 8.6-50 MG PO TABS *I*
2.0000 | ORAL_TABLET | Freq: Every day | ORAL | 5 refills | Status: DC | PRN
Start: 2016-04-10 — End: 2016-07-08

## 2016-04-10 NOTE — Telephone Encounter (Signed)
Richardson Landry from [facility] called to schedule this patient for a NPV (referral is in wq from ENT). I scheduled the patient for a slot in August but Richardson Landry would like ot know if we can get her in sooner as patient has removed her trach. Please contact him at [phone].

## 2016-04-10 NOTE — Progress Notes (Signed)
Reason For Visit: The primary encounter diagnosis was Fracture of fifth metacarpal bone of left hand. 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      HPI:  Cindy Ochoa is 30 y.o. year old female with neurogenic bladder, quadriparesis, spasticity, thrombocytopenia,, neurogenic bladder, neurogenic bowel, depression, autonomic dysfunction    Name    Fracture of fifth metacarpal bone of left hand  Patient is coming out of the bathroom and her left hand: On the door frame.  She is left-hand dominant.  She sustained a fracture left small fracture.    She is waiting to have a ulnar splint made by orthotics . there is a long wait so she left without returning. She reports having issues with the staff        Depression  Patient is currently not on any medication for depression   She has several members who are depressed and on she believes SSRIs which have been helpful   Currently working 2-3 days per week.  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   Has 8 service 24 hours day         Tracheostomy complication, unspecified  Patient was seen by ENT for possible surgical repair of trach fistula  She has not hear back about a sleep study        Constipation, unspecified constipation type  Uses suppositories every other day   Stools are hard, she has ran out of her Colace   Currently not on any fiber supplement        Social :She is here with her aid today   She continues to smoke     Medications:     Current Outpatient Prescriptions   Medication Sig    mirtazapine (REMERON) 7.5 MG tablet Take 1 tablet (7.5 mg total) by mouth nightly    bisacodyl (BISAC-EVAC) 10 MG suppository 1-2 as needed every other day    senna-docusate (PERICOLACE) 8.6-50 MG per tablet Take 2 tablets by mouth daily as needed for  Constipation    polyethylene glycol (GLYCOLAX) powder Take 17 g by mouth daily   Mix in 8 oz water or juice and drink.    naproxen sodium (ANAPROX) 220 MG tablet Take 1 tablet (220 mg total) by mouth 2 times daily (with meals)    generic DME Left fifth digit finger splint for sprain.    dantrolene (DANTRIUM) 25 MG capsule 1 tablet orally once a day for 2 weeks then increase to 1 caps twice a day.    Non-System Medication Urostomy drainage bags change as needed. Dx N39.42 and  G82.54    incontinence supply disposable Large pull ups - use up to 5 x daily  Dx N39.42    disposable underpads 30"x36" (CHUX) Use 6 times daily and PRN. Dx N39.42  Incontinence without sensory awareness    disposable gloves 2 boxes Disposable Medium size gloves    MAPAP 500 MG tablet take 1 tablet by mouth every 4 to 6 hours if needed for pain    lidocaine (XYLOCAINE) 2 % jelly Apply to wound prior to packing    disposable gloves 2 boxes Disposable Medium size gloves    Non-System Medication Shower Chair - Quadriparesis G82.54    Ostomy Supplies (HOLLISTER REPLACEMENT FILTERS) MISC Hollister Adapter (order # [id number]).  ARTIFICIAL TEARS 1.4 % ophthalmic solution PLACE 1 DROP INTO BOTH EYES FOUR TIMES DAILY    Non-System Medication PRI AND SCREEN  DIAGNOSIS 344.04 Quadriparesis    white petrolatum-mineral oil (GENTEAL PM) 85-15 % ophthalmic ointment Place into both eyes nightly    Skin Protectants, Misc. OINT Apply 1 Tube topically 2 times daily      Non-System Medication One Hoyer Lift  Diagnosis:  Quadriplegic ICD-9 344.04     No current facility-administered medications for this visit.        Medication list reconciled this visit    Allergies:     Allergies   Allergen Reactions    Nitrofurantoin Nausea And Vomiting    Vancomycin Hives     hives 2006 but tolerated Rx in 2010  pt states she had vancomycin in 04/2012.  Feels this is not true allergy  as she has received it recently - had no reaction     Heparin Other (See  Comments)     Thrombocytopenia;        Social history      Social History   Substance Use Topics    Smoking status: Current Every Day Smoker     Packs/day: 0.50     Years: 3.00     Types: Cigars     Start date: 12/09/2011    Smokeless tobacco: Former Systems developer     Quit date: 04/29/2005      Comment: ~5 a day    Alcohol use No       Review of Systems     Review of Systems   Constitutional: Negative for chills and fever.   Respiratory: Negative for cough, shortness of breath and wheezing.    Cardiovascular: Negative for chest pain, palpitations and leg swelling.   Gastrointestinal: Negative for abdominal pain.   Skin: Negative for rash.   Psychiatric/Behavioral: Positive for depression. Negative for hallucinations, substance abuse and suicidal ideas. The patient has insomnia. The patient is not nervous/anxious.           Physical Exam:     Physical Exam   Constitutional: She appears well-developed and well-nourished.   Cardiovascular: Normal rate and regular rhythm.    Pulmonary/Chest: Effort normal and breath sounds normal.   Trach fistula     left 5th digit slightly swollen, I tapped them together for support         Vitals:    04/10/16 1008   BP: 118/68   Pulse: 88   Temp: 35.2 C (95.4 F)   TempSrc: Temporal   Height: 1.651 m (5\' 5" )     Wt Readings from Last 3 Encounters:   03/27/16 73 kg (161 lb)   02/17/16 73 kg (161 lb)   02/13/16 81.2 kg (179 lb)     BP Readings from Last 3 Encounters:   04/10/16 118/68   03/27/16 126/80   03/23/16 96/60           ASSESSMENT/PLAN:       Fracture of fifth metacarpal bone of left hand   buddy taped fingers together    Depression       Remeron 7.5 mg 1 at bedtime       Follow-up in 1 week to check progress       Support given    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      Follow up: Follow up in 1 month      Lavone Orn,  Nurse Practitioner  [facility] Internal  Medicine

## 2016-04-14 ENCOUNTER — Other Ambulatory Visit: Payer: Self-pay | Admitting: Internal Medicine

## 2016-04-14 MED ORDER — ACETAMINOPHEN 500 MG PO TABS *I*
500.0000 mg | ORAL_TABLET | ORAL | 5 refills | Status: DC | PRN
Start: 2016-04-14 — End: 2016-12-07

## 2016-04-20 ENCOUNTER — Ambulatory Visit: Payer: Self-pay | Admitting: Orthopedic Surgery

## 2016-04-24 ENCOUNTER — Telehealth: Payer: Self-pay | Admitting: Urology

## 2016-04-24 DIAGNOSIS — N390 Urinary tract infection, site not specified: Secondary | ICD-10-CM

## 2016-04-24 MED ORDER — CEPHALEXIN 500 MG PO CAPS *I*
500.0000 mg | ORAL_CAPSULE | Freq: Two times a day (BID) | ORAL | 0 refills | Status: DC
Start: 2016-04-24 — End: 2016-05-12

## 2016-04-24 NOTE — Telephone Encounter (Signed)
Nursing - please notify pt that urine culture was sent to pharmacy. Empiric keflex sent to pharmacy as well. Pt should call Monday for the culture results.     Maida Sale, NP  04/24/2016  12:08 PM

## 2016-04-24 NOTE — Telephone Encounter (Signed)
Cindy Ochoa calling to report that she is experiencing UTI. These symptoms have been going on for 3-4 day(s)    Patient requesting same day appointment? no, but is requesting an order to be placed in system for a urine analysis.  If a script is needed please sent to [facility]      Patient is asking to please be called back @ [phone]

## 2016-04-24 NOTE — Telephone Encounter (Signed)
SPoke with patient she is experiencing chills,dark cloudy urine. Requesting order to be sent in for urine culture. Please advise.   Bertram Gala, LPN  [id number] AM  [phone]

## 2016-04-24 NOTE — Telephone Encounter (Signed)
Unable to leave detailed message, LM for patient to contact me back.   Bertram Gala, LPN  [id number] PM  [phone]

## 2016-04-27 ENCOUNTER — Other Ambulatory Visit
Admission: RE | Admit: 2016-04-27 | Discharge: 2016-04-27 | Disposition: A | Discharge status: Home / Self Care | Payer: Self-pay | Source: Ambulatory Visit | Attending: Urology | Admitting: Urology

## 2016-04-27 DIAGNOSIS — N390 Urinary tract infection, site not specified: Secondary | ICD-10-CM

## 2016-04-28 ENCOUNTER — Telehealth: Payer: Self-pay | Admitting: Urology

## 2016-04-28 LAB — AEROBIC CULTURE: Aerobic Culture: 0

## 2016-04-28 NOTE — Telephone Encounter (Signed)
Nursing - please notify pt that urine culture is negative for infection. She should discontinue the keflex. If sx continue, she should schedule an OV to be seen.     Maida Sale, NP  04/28/2016  11:28 AM

## 2016-04-28 NOTE — Telephone Encounter (Signed)
Called and left general message for pt to call office back

## 2016-05-01 ENCOUNTER — Telehealth: Payer: Self-pay | Admitting: Sleep Medicine

## 2016-05-01 NOTE — Telephone Encounter (Signed)
Left vm to confirm 05/05/16 appt

## 2016-05-05 ENCOUNTER — Ambulatory Visit: Payer: Self-pay | Admitting: Sleep Medicine

## 2016-05-05 ENCOUNTER — Ambulatory Visit: Payer: Self-pay

## 2016-05-11 ENCOUNTER — Encounter: Payer: Self-pay | Admitting: Physical Medicine and Rehabilitation

## 2016-05-11 ENCOUNTER — Encounter: Payer: Self-pay | Admitting: Orthopedic Surgery

## 2016-05-11 ENCOUNTER — Ambulatory Visit: Payer: Self-pay | Admitting: Internal Medicine

## 2016-05-12 ENCOUNTER — Encounter: Payer: Self-pay | Admitting: Internal Medicine

## 2016-05-12 ENCOUNTER — Ambulatory Visit: Payer: Self-pay | Admitting: Internal Medicine

## 2016-05-12 VITALS — BP 98/64 | HR 76 | Temp 97.7°F

## 2016-05-12 DIAGNOSIS — F3289 Other specified depressive episodes: Secondary | ICD-10-CM

## 2016-05-12 DIAGNOSIS — K5909 Other constipation: Secondary | ICD-10-CM

## 2016-05-12 DIAGNOSIS — G8254 Quadriplegia, C5-C7 incomplete: Secondary | ICD-10-CM

## 2016-05-12 MED ORDER — MIRTAZAPINE 7.5 MG PO TABS *A*
7.5000 mg | ORAL_TABLET | Freq: Every evening | ORAL | 1 refills | Status: DC
Start: 2016-05-12 — End: 2016-10-27

## 2016-05-12 NOTE — Patient Instructions (Addendum)
[facility]  [address]  [address]  [phone]

## 2016-05-12 NOTE — Progress Notes (Signed)
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 Diagnoses of Constipation, chronic and Depression were also pertinent to this visit.      HPI:  Cindy Ochoa is 30 y.o. year old female with autonomic dysfunction, decubiti, depression, headache,  muscle spasms, kidney stones, neurogenic bladder , kidney stones,Quadriplegic at C6    Patient is here today for follow-up for depression   She has been volunteering and [facility]  Patient Patient's been volunteering [facility]. 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 This has helped her mood.  She was also started on Remeron 7.5. She is sleeping better.  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  She in motorized wheelchair   She has aid service most of the day         Constipation, chronic  She using supp every other day  She uses pericolace daily  She sometime needs miralax        She is followed by wound care  Aid reports that wound is about the size of a quarter    Patient continues to smoke pot daily it helps with muscle spasms     She has trach fistula  She  Has gone to ent discussed closure  She is waiting on sleep study which is scheduled for August    Follow by urology for frequent UTI and kidney stones  She has urostomy  Currently on antibiotics for UTI    Social :patient is here with her NA  She has a son who is 44.  Thinking about having another baby     Medications:     Current Outpatient Prescriptions   Medication Sig    mirtazapine (REMERON) 7.5 MG tablet Take 1 tablet (7.5 mg total) by mouth nightly    acetaminophen (MAPAP) 500 mg tablet Take 1 tablet (500 mg total) by mouth every 4-6 hours as needed   for pain    bisacodyl (BISAC-EVAC) 10 MG suppository 1-2 as needed every other day    senna-docusate (PERICOLACE) 8.6-50 MG per tablet Take 2 tablets by mouth daily as needed for Constipation    polyethylene glycol (GLYCOLAX) powder Take 17 g by mouth daily   Mix in 8 oz water or juice and drink.    naproxen sodium (ANAPROX) 220 MG  tablet Take 1 tablet (220 mg total) by mouth 2 times daily (with meals)    generic DME Left fifth digit finger splint for sprain.    dantrolene (DANTRIUM) 25 MG capsule 1 tablet orally once a day for 2 weeks then increase to 1 caps twice a day.    Non-System Medication Urostomy drainage bags change as needed. Dx N39.42 and  G82.54    incontinence supply disposable Large pull ups - use up to 5 x daily  Dx N39.42    disposable underpads 30"x36" (CHUX) Use 6 times daily and PRN. Dx N39.42  Incontinence without sensory awareness    disposable gloves 2 boxes Disposable Medium size gloves    lidocaine (XYLOCAINE) 2 % jelly Apply to wound prior to packing    disposable gloves 2 boxes Disposable Medium size gloves    Non-System Medication Shower Chair - Quadriparesis G82.54    Ostomy Supplies (HOLLISTER REPLACEMENT FILTERS) MISC Hollister Adapter (order # [phone]).    ARTIFICIAL TEARS 1.4 % ophthalmic solution PLACE 1 DROP INTO BOTH EYES FOUR TIMES DAILY    Non-System Medication PRI AND SCREEN  DIAGNOSIS 344.04 Quadriparesis  white petrolatum-mineral oil (GENTEAL PM) 85-15 % ophthalmic ointment Place into both eyes nightly    Skin Protectants, Misc. OINT Apply 1 Tube topically 2 times daily      Non-System Medication One Hoyer Lift  Diagnosis:  Quadriplegic ICD-9 344.04     No current facility-administered medications for this visit.        Medication list reconciled this visit    Allergies:     Allergies   Allergen Reactions    Nitrofurantoin Nausea And Vomiting    Vancomycin Hives     hives 2006 but tolerated Rx in 2010  pt states she had vancomycin in 04/2012.  Feels this is not true allergy  as she has received it recently - had no reaction     Heparin Other (See Comments)     Thrombocytopenia;        Social history      Social History   Substance Use Topics    Smoking status: Current Every Day Smoker     Packs/day: 0.50     Years: 3.00     Types: Cigars     Start date: 12/09/2011    Smokeless tobacco:  Former Systems developer     Quit date: 04/29/2005      Comment: black and milds    Alcohol use No       Review of Systems     Review of Systems   Constitutional: Negative for chills and fever.   Respiratory: Negative for cough and sputum production.    Cardiovascular: Negative for chest pain, palpitations and leg swelling.   Gastrointestinal: Negative for abdominal pain.   Musculoskeletal: Negative for back pain.   Psychiatric/Behavioral: Positive for substance abuse. Negative for hallucinations and suicidal ideas. Depression: improved  The patient is not nervous/anxious and does not have insomnia.             Physical Exam:     Physical Exam   Constitutional:   Sitting in a motorized wheelchair  She is diaphoretic   Neck:   Trach fistula    Pulmonary/Chest: Effort normal and breath sounds normal.   Abdominal: Soft. Bowel sounds are normal.   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        Neurogenic bladder, neurogenic bowel        Patient has a service at home        She is also followed by wound care    Constipation, chronic stable     Depression improved   -     mirtazapine (REMERON) 7.5 MG tablet; Take 1 tablet (7.5 mg total) by mouth nightly        Encourage her to continue her volunteer work    Therapist, nutritional fistula        Keep appointment with sleep study         Follow-up with ENT    I encourage her to speak with high risk  pregnancy prior to getting pregnant     No sure if she would qualify for medical marijuana  It may be helping her spasm and autonomic dysfunction        Follow up: 2 months    Patient instructions      [address]  [place]         Lavone Orn,  Nurse  Practitioner  [facility] Internal  Medicine

## 2016-05-18 ENCOUNTER — Ambulatory Visit: Payer: Self-pay | Admitting: Physical Medicine and Rehabilitation

## 2016-05-18 ENCOUNTER — Encounter: Payer: Self-pay | Admitting: Surgery

## 2016-05-18 ENCOUNTER — Ambulatory Visit: Payer: Self-pay | Admitting: Surgery

## 2016-05-18 VITALS — BP 102/55 | HR 85 | Temp 96.6°F | Resp 19 | Ht 65.0 in | Wt 163.0 lb

## 2016-05-18 DIAGNOSIS — L89154 Pressure ulcer of sacral region, stage 4: Secondary | ICD-10-CM

## 2016-05-18 MED ORDER — LIDOCAINE HCL 2 % EX JELLY WRAPPED *I*
CUTANEOUS | Status: AC | PRN
Start: 2016-05-18 — End: 2016-07-17
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
## 2016-05-18 NOTE — Patient Instructions (Addendum)
Please follow these instructions about how to care for your wound.            Wound Location Dressing Orders Frequency of Dressing Change   Location: Sacral Aquacel  ABD pad  Tape [x]  Daily        Cleanse Wound(s) with:      [x]  Soap & Water with dressing changes, keep dressing dry in between          [x]   Leg Elevation:  Elevate leg(s) to level of heart   or above for 30 minutes 3 times per day.  [x]   Right         Leg [x]   Left        Leg        [x]   Assistive Device(s):   [x]   Wheelchair     [x]   Pressure Reduction:   [x]   Wheelchair Cushion         [x]   Specialty Bed   [x]   Reposition every 2 hours         [x]   Limit time up in your chair to 2 hours or less at a time         [x]   General:    [x]   Stop / Decrease Smoking               Wound Care Clinic Discharge Instructions    Following the instructions below will give you the best opportunity for wound healing.    Wound Care Instructions (If changing own dressing):   Gather all supplies you will need to change your dressing.   Before changing your dressing, wash your hands for at least 15 seconds with warm soapy water.   Rinse off all soap, then dry with a towel.   Remove the old dressing.  Wash hands again before applying the new dressing.    If you experience any of the following during our business hours of 8 AM - 4:30 PM, Monday - Friday, please call the [facility] at [phone]:    ? Increase in pain  ?Temperature over 101F   ?Drainage with a foul odor   ?Bleeding   ? Increase in swelling  ? Increase in drainage from your wound  ? Need for compression bandage changes (slippage, breakthrough drainage)    Please contact your primary care physician or proceed to the nearest emergency room if you experience any of the above after our business hours.     Please note your appointment(s) above - if you are unable to keep, kindly give 24 hours' notice.  Thank You!

## 2016-05-27 ENCOUNTER — Other Ambulatory Visit: Payer: Self-pay | Admitting: Internal Medicine

## 2016-05-27 ENCOUNTER — Ambulatory Visit
Admission: RE | Admit: 2016-05-27 | Discharge: 2016-05-27 | Disposition: A | Discharge status: Home / Self Care | Payer: Self-pay | Source: Ambulatory Visit | Admitting: Dental General Practice

## 2016-05-27 MED ORDER — NON-SYSTEM MEDICATION *A*
4 refills | Status: DC
Start: 2016-05-27 — End: 2016-12-10

## 2016-05-27 NOTE — Telephone Encounter (Signed)
Ms. Bibian called in regards to needing Leg bags script written for  1000mg  and sent to [facility]    Ms. Pokorney can be reached at [phone]

## 2016-06-04 ENCOUNTER — Telehealth: Payer: Self-pay | Admitting: Urology

## 2016-06-04 ENCOUNTER — Other Ambulatory Visit: Payer: Self-pay | Admitting: Urology

## 2016-06-04 DIAGNOSIS — N390 Urinary tract infection, site not specified: Secondary | ICD-10-CM

## 2016-06-04 NOTE — Telephone Encounter (Signed)
Please triage. I am okay to send a urine sample but this patient catheterizes, so it will come back with bacteria. Dark and or cloudy or even malodorous urine is not an indication to treat by itself. Thanks.

## 2016-06-04 NOTE — Telephone Encounter (Signed)
Called and left generic message due to hippa, asked to call back. Please ask following questions. Thanks [employer].      1. Is pt having pain currently?  2. Location of pain (flank/back, abdomen, right or left)?  3. Character of pain (sharp, dull, intermittent, constant)?  4. Urgency/Frequency/Burning/Hematuria?  5. Fever or chills?  6. Nausea/vomiting?  7. Any constipation?  8. How much fluid is patient drinking a day?  9. Is patient able to give urine sample to any strong lab?(order placed)

## 2016-06-04 NOTE — Telephone Encounter (Signed)
Patient calling to get a urine order put in. States she has dark colored urine with odor. States she has not been feeling well with chills. Patient can be reached at [phone].

## 2016-06-05 ENCOUNTER — Other Ambulatory Visit
Admission: RE | Admit: 2016-06-05 | Discharge: 2016-06-05 | Disposition: A | Discharge status: Home / Self Care | Payer: Self-pay | Source: Ambulatory Visit | Attending: Urology | Admitting: Urology

## 2016-06-05 LAB — URINALYSIS WITH MICROSCOPIC
Ketones, UA: NEGATIVE
Nitrite,UA: POSITIVE — AB
Protein,UA: 30 mg/dL — AB
RBC,UA: 8 /hpf — AB (ref 0–2)
Specific Gravity,UA: 1.01 (ref 1.002–1.030)
WBC,UA: 55 /hpf — ABNORMAL HIGH (ref 0–5)
pH,UA: 6 (ref 5.0–8.0)

## 2016-06-05 NOTE — Telephone Encounter (Signed)
   Teniyah is returning Cong[place] call, provided the following information:    1. Is pt having pain currently? Patient is a quadriplegic, doesn't feel pain  2. Location of pain (flank/back, abdomen, right or left)? NA  3. Character of pain (sharp, dull, intermittent, constant)? NA  4. Urgency/Frequency/Burning/Hematuria? Urine has cloudy, mucus like substance to it and has strong foul odor, urinary frequency, denies hematuria  5. Fever or chills? Chills, sweating profusely  6. Nausea/vomiting? No  7. Any constipation? No  8. How much fluid is patient drinking a day? 6 cups of green tea, 5 water jugs  9. Is patient able to give urine sample to any strong lab?(order placed) Yes    Please call back to advise at [phone].

## 2016-06-05 NOTE — Telephone Encounter (Signed)
Called patient. I would prefer to wait until culture results to treat given that patient catheterizes. Patient is in agreement. SHe is aware that if she develops a fever over the weekend that she should call the office or visit the ED after hours.

## 2016-06-05 NOTE — Telephone Encounter (Signed)
[name],   Please review patients symptoms and advise. Thanks [employer].

## 2016-06-08 ENCOUNTER — Telehealth: Payer: Self-pay | Admitting: Urology

## 2016-06-08 ENCOUNTER — Other Ambulatory Visit: Payer: Self-pay | Admitting: Urology

## 2016-06-08 LAB — AEROBIC CULTURE

## 2016-06-08 MED ORDER — CEPHALEXIN 500 MG PO CAPS *I*
500.0000 mg | ORAL_CAPSULE | Freq: Three times a day (TID) | ORAL | 0 refills | Status: AC
Start: 2016-06-08 — End: 2016-06-15

## 2016-06-08 NOTE — Telephone Encounter (Signed)
Called and left voice mail, informed of below. Asked to call back if questions. Thanks [employer].      Notes Recorded by Marilynn Latino, NP on 06/08/2016 at 9:53 AM  Keflex x7 days ordered. If patient's symptoms do not improve, she should visit PCP to rule out other systemic cause.       3d ago      Aerobic Culture Escherichia coli (!)    Comments: >100,000/ml

## 2016-06-08 NOTE — Telephone Encounter (Signed)
Cindy Ochoa contacted me over the weekend regarding results from her urine culture.  She had a culture from 05/05/16 which finalized yesterday.  Her culture is positive.       [name] please contact patient and treat as needed. Looks as if you talked with her while covering for [place].     Thanks.

## 2016-06-09 LAB — URINALYSIS WITH MICROSCOPIC

## 2016-06-10 ENCOUNTER — Encounter: Payer: Self-pay | Admitting: Gastroenterology

## 2016-06-17 ENCOUNTER — Telehealth: Payer: Self-pay | Admitting: Internal Medicine

## 2016-06-17 ENCOUNTER — Ambulatory Visit: Payer: Self-pay | Admitting: Primary Care

## 2016-06-17 VITALS — BP 96/52 | HR 76 | Temp 98.1°F

## 2016-06-17 DIAGNOSIS — N390 Urinary tract infection, site not specified: Secondary | ICD-10-CM

## 2016-06-17 LAB — URINALYSIS REFLEX TO CULTURE
Ketones, UA: NEGATIVE
Nitrite,UA: POSITIVE — AB
Protein,UA: 100 mg/dL — AB
Specific Gravity,UA: 1.016 (ref 1.002–1.030)
pH,UA: 6 (ref 5.0–8.0)

## 2016-06-17 LAB — CBC AND DIFFERENTIAL
Baso # K/uL: 0 10*3/uL (ref 0.0–0.1)
Basophil %: 0.1 %
Eos # K/uL: 0.1 10*3/uL (ref 0.0–0.4)
Eosinophil %: 0.7 %
Hematocrit: 40 % (ref 34–45)
Hemoglobin: 13.3 g/dL (ref 11.2–15.7)
IMM Granulocytes #: 0 10*3/uL (ref 0.0–0.1)
IMM Granulocytes: 0.3 %
Lymph # K/uL: 2.9 10*3/uL (ref 1.2–3.7)
Lymphocyte %: 42.4 %
MCH: 30 pg/cell (ref 26–32)
MCHC: 33 g/dL (ref 32–36)
MCV: 91 fL (ref 79–95)
Mono # K/uL: 0.4 10*3/uL (ref 0.2–0.9)
Monocyte %: 5.4 %
Neut # K/uL: 3.4 10*3/uL (ref 1.6–6.1)
Nucl RBC # K/uL: 0 10*3/uL (ref 0.0–0.0)
Nucl RBC %: 0 /100 WBC (ref 0.0–0.2)
Platelets: 105 10*3/uL — ABNORMAL LOW (ref 160–370)
RBC: 4.4 MIL/uL (ref 3.9–5.2)
RDW: 15.2 % — ABNORMAL HIGH (ref 11.7–14.4)
Seg Neut %: 51.1 %
WBC: 6.7 10*3/uL (ref 4.0–10.0)

## 2016-06-17 LAB — POCT URINALYSIS DIPSTICK
Bilirubin,Ur: NEGATIVE
Exp date: 201802
Glucose,UA POCT: NORMAL
Ketones,UA POCT: NEGATIVE
Leuk Esterase,UA POCT: 1 — AB
Lot #: 16608901
Nitrite,UA POCT: POSITIVE — AB
PH,UA POCT: 7 (ref 5–8)
Specific gravity,UA POCT: 1.01 (ref 1.002–1.03)
Urobilinogen,UA: NORMAL

## 2016-06-17 LAB — URINE MICROSCOPIC (IQ200)
RBC,UA: <1 /hpf (ref 0–2)
WBC,UA: 9 /hpf — AB (ref 0–5)

## 2016-06-17 LAB — BASIC METABOLIC PANEL
Anion Gap: 18 — ABNORMAL HIGH (ref 7–16)
CO2: 21 mmol/L (ref 20–28)
Calcium: 9.7 mg/dL (ref 8.8–10.2)
Chloride: 103 mmol/L (ref 96–108)
Creatinine: 0.5 mg/dL — ABNORMAL LOW (ref 0.51–0.95)
GFR,Black: 149 *
GFR,Caucasian: 129 *
Glucose: 90 mg/dL (ref 60–99)
Lab: 13 mg/dL (ref 6–20)
Potassium: 3.6 mmol/L (ref 3.3–5.1)
Sodium: 142 mmol/L (ref 133–145)

## 2016-06-17 LAB — CRP: CRP: 17 mg/L — ABNORMAL HIGH (ref 0–10)

## 2016-06-17 LAB — TSH: TSH: 1.72 u[IU]/mL (ref 0.27–4.20)

## 2016-06-17 NOTE — Telephone Encounter (Signed)
-----   Message from Roby Lofts sent at 06/17/2016  3:13 PM EDT -----      ----- Message -----     From: Garlan Fair, MD     Sent: 06/17/2016   3:03 PM       To: [facility] Team    Can we get wound care to come see this patient earlier? She says her bed sore is bleeding more than usual. Thanks!    - Urgent Care

## 2016-06-17 NOTE — Patient Instructions (Addendum)
We will call with results.     Please get your labs drawn.     Take medications as needed.

## 2016-06-17 NOTE — Progress Notes (Signed)
[facility]    Reason For Visit:     Subjective:      Cindy Ochoa is 30 y.o. year old female with PMH quadriparesis at C6, depression, autonomic dysfunction, neurogenic bladder presenting with concern for infection.     6/9 E. Coli- given 7 days of Keflex. Resistant to ampicillin, cipro, bactrim.     Dark pee. Chills. Spasms worse with infection. "I don't feel right"--- wants blood work done to check and see if there is anything going on.     Pressure ulcer bleeding more than usual. Wants appointment moved up for wound care.       Medications:     Current Outpatient Prescriptions on File Prior to Visit   Medication Sig Dispense Refill    Non-System Medication Leg Bag 1000mg  - diagnosis G82.54 21 each 4    mirtazapine (REMERON) 7.5 MG tablet Take 1 tablet (7.5 mg total) by mouth nightly 30 tablet 1    acetaminophen (MAPAP) 500 mg tablet Take 1 tablet (500 mg total) by mouth every 4-6 hours as needed   for pain 180 tablet 5    bisacodyl (BISAC-EVAC) 10 MG suppository 1-2 as needed every other day 50 suppository 3    senna-docusate (PERICOLACE) 8.6-50 MG per tablet Take 2 tablets by mouth daily as needed for Constipation 30 tablet 5    polyethylene glycol (GLYCOLAX) powder Take 17 g by mouth daily   Mix in 8 oz water or juice and drink. 255 g 1    naproxen sodium (ANAPROX) 220 MG tablet Take 1 tablet (220 mg total) by mouth 2 times daily (with meals) 14 tablet 0    generic DME Left fifth digit finger splint for sprain. 1 each 0    dantrolene (DANTRIUM) 25 MG capsule 1 tablet orally once a day for 2 weeks then increase to 1 caps twice a day. 44 capsule 0    Non-System Medication Urostomy drainage bags change as needed. Dx N39.42 and  G82.54 10 each 5    incontinence supply disposable Large pull ups - use up to 5 x daily  Dx N39.42 150 each 5    disposable underpads 30"x36" (CHUX) Use 6 times daily and PRN. Dx N39.42  Incontinence without sensory awareness 150 each 5    disposable  gloves 2 boxes Disposable Medium size gloves 200 each 6    disposable gloves 2 boxes Disposable Medium size gloves 200 each 6    Non-System Medication Shower Chair - Quadriparesis G82.54 1 each 0    Ostomy Supplies (HOLLISTER REPLACEMENT FILTERS) MISC Hollister Adapter (order # [id number]). 1 each 1    ARTIFICIAL TEARS 1.4 % ophthalmic solution PLACE 1 DROP INTO BOTH EYES FOUR TIMES DAILY 15 mL 10    Non-System Medication PRI AND SCREEN  DIAGNOSIS 344.04 Quadriparesis 1 each 0    white petrolatum-mineral oil (GENTEAL PM) 85-15 % ophthalmic ointment Place into both eyes nightly 3.5 mL 6    Skin Protectants, Misc. OINT Apply 1 Tube topically 2 times daily   1 Tube 11    Non-System Medication One Hoyer Lift  Diagnosis:  Quadriplegic ICD-9 344.04 1 each 0     Current Facility-Administered Medications on File Prior to Visit   Medication Dose Route Frequency Provider Last Rate Last Dose    lidocaine (XYLOCAINE,REGENECARE) 2 % jelly   Topical PRN Zollie Beckers, NP           Medications reviewed and reconciled.   Allergies:  Allergies   Allergen Reactions    Nitrofurantoin Nausea And Vomiting    Vancomycin Hives     hives 2006 but tolerated Rx in 2010  pt states she had vancomycin in 04/2012.  Feels this is not true allergy  as she has received it recently - had no reaction     Heparin Other (See Comments)     Thrombocytopenia;        Review of Systems:   CONSTITUTIONAL: Appetite good, no fevers, night sweats or weight loss  EYES: No visual changes, no eye pain  ENT: No hearing difficulties, no ear pain  CV: No chest pain, shortness of breath or peripheral edema  PULMONARY: No cough, wheezing, or dyspnea  GI: No nausea/vomiting, abdominal pain, or change in bowel habits  GU: No dysuria, urgency, or incontinence  MS: No joint pain/swelling or musculoskeletal deformities  SKIN: No rashes  PSYCH: No hallucinations, no anxiety/depression, no suicidal/homicidal ideation  ENDOCRINE: No polyuria/polydipsia, no heat  intolerance  HEME/LYMPH: No easy bleeding/bruising or swollen nodes  ALL/IMMUN: No allergic reactions.    Physical Exam:     Vitals:    06/17/16 1433   BP: 96/52   BP Location: Left arm   Patient Position: Sitting   Cuff Size: large adult   Pulse: 76   Temp: 36.7 C (98.1 F)   TempSrc: Temporal     Wt Readings from Last 3 Encounters:   05/18/16 73.9 kg (163 lb)   03/27/16 73 kg (161 lb)   02/17/16 73 kg (161 lb)     BP Readings from Last 3 Encounters:   06/17/16 96/52   05/18/16 102/55   05/12/16 98/64       General: WDWN, NAD, in wheelchair, quad.   HEENT: PERRL,neck without lymphadenopathy.  Pulmonary: CTA B/L. No wheezes, rhonchi, or rales.  Normal work of breathing  Cardiovascular: 2+ radial pulses bilaterally.  RRR. No murmurs, rubs, or gallops. No lower extremity edema.  Abdominal: Soft, non-tender, non-distended, normoactive bowel sounds, no rebound of guarding   Neuro: CN 2-12 intact. C6 quadriplegic.        Assessment and Plan:   Will check labs and UA to see if patient's  UTI is resolved or ongoing. Per sacral wound-- will contact [facility] team and wound care to see if we can move up her appointment.     - CBC, BMP, TSH, CRP  -- follow up   - will try to set up wound care earlier: sent message to [facility] team as well as Zollie Beckers who usually takes care of her wound.   - UA with reflex to culture-- follow up    Follow up PRN.     Garlan Fair, MD  PGY-1, Internal Medicine  06/17/2016 2:42 PM

## 2016-06-18 ENCOUNTER — Telehealth: Payer: Self-pay

## 2016-06-18 LAB — AEROBIC CULTURE: Aerobic Culture: 0

## 2016-06-18 NOTE — Telephone Encounter (Signed)
Returned Tiffany's call. Ok with [place] to open homecare to check on [name] (her wounds are bleeding more than normal) as she did not want to be seen tomorrow and was provided with an appointment for next week. Tiffany was waiting to hear back from [place] to see if she would be receptive to this idea. We both verbalized understanding and agreement to the plan. Will continue to follow. Lemont Fillers, RN

## 2016-06-18 NOTE — Telephone Encounter (Signed)
Tiffany from [facility] called regarding Adileigh. She was trying to determine if Michiye has homecare. I advised I can ask a nurse to contact her and discuss this. Tiffany asked that we call the main office number of [phone] which is the access center and ask them to connect you to the urgent line to return Tiffany's call.

## 2016-06-18 NOTE — Telephone Encounter (Signed)
Called wound healing center.  Pt was offered an appointment this Friday which she declined due to a graduation in her family.   Left message with patient to call office.  Will discuss with pt if she wants [facility] wound team to go out to patients home in the interim.

## 2016-06-19 ENCOUNTER — Telehealth: Payer: Self-pay | Admitting: Internal Medicine

## 2016-06-19 NOTE — Telephone Encounter (Signed)
Cindy Ochoa is requesting a return call to review her recent test results.    What type of test was done? Urinalysis      Where was the test done? [facility]    On what day was the test done? 06/17/16    Please return the patients call at [phone].

## 2016-06-19 NOTE — Telephone Encounter (Signed)
Third attempt to contact pt.  Left message for pt to return call.

## 2016-06-19 NOTE — Telephone Encounter (Signed)
Second attempt to contact pt.  Left message for pt to return call.

## 2016-06-20 ENCOUNTER — Encounter: Payer: Self-pay | Admitting: Primary Care

## 2016-06-25 ENCOUNTER — Encounter: Payer: Self-pay | Admitting: Surgery

## 2016-06-26 ENCOUNTER — Ambulatory Visit: Payer: Self-pay | Admitting: Surgery

## 2016-06-26 ENCOUNTER — Encounter: Payer: Self-pay | Admitting: Surgery

## 2016-06-26 ENCOUNTER — Ambulatory Visit: Payer: Self-pay | Admitting: Urology

## 2016-06-26 VITALS — BP 102/65 | HR 104 | Temp 95.8°F | Resp 22 | Ht 65.0 in | Wt 164.4 lb

## 2016-06-26 DIAGNOSIS — L89154 Pressure ulcer of sacral region, stage 4: Secondary | ICD-10-CM
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
## 2016-06-26 NOTE — Patient Instructions (Addendum)
Please follow these instructions about how to care for your wound.         Wound Location Dressing Orders Frequency of Dressing Change   Location: Sacral Calmoseptine to peri wound  Aquacel  ABD pad  Tape      Vitamin a+d to dry areas [x]  Daily or more if soiled or wet     Cleanse Wound(s) with:     [x]  Soap & Water with dressing changes, keep dressing dry in between       [x]  Leg Elevation: Elevate leg(s) to level of heart   or above for 30 minutes 3 times per day.  [x]  Right   Leg [x]  Left   Leg       [x]  Assistive Device(s):  [x]  Wheelchair    [x]  Pressure Reduction:  [x]  Wheelchair Cushion   [x]  Specialty Bed [x]  Reposition every 2 hours   [x]  Limit time up in your chair to 2 hours or less at a time      [x]  General:  [x]  Stop / Decrease Smoking              Wound Care Clinic Discharge Instructions    Following the instructions below will give you the best opportunity for wound healing.    Wound Care Instructions (If changing own dressing):  Gather all supplies you will need to change your dressing.  Before changing your dressing, wash your hands for at least 15 seconds with warm soapy water.  Rinse off all soap, then dry with a towel.  Remove the old dressing. Wash hands again before applying the new dressing.    If you experience any of the following during our business hours of 8 AM - 4:30 PM, Monday - Friday, please call the [facility] at [phone]:    ? Increase in pain ?Temperature over 101F ?Drainage with a foul odor ?Bleeding ? Increase in swelling ? Increase in drainage from your wound ? Need for compression bandage changes (slippage, breakthrough drainage)    Please contact your primary care physician or proceed to the nearest emergency room if you experience any of the above after our business hours.     Please note your appointment(s) above - if you are unable to keep, kindly give 24 hours' notice. Thank You!

## 2016-07-07 ENCOUNTER — Ambulatory Visit: Payer: Self-pay | Admitting: Physical Medicine and Rehabilitation

## 2016-07-07 ENCOUNTER — Other Ambulatory Visit: Payer: Self-pay | Admitting: Internal Medicine

## 2016-07-07 VITALS — BP 97/66 | HR 84 | Ht 65.0 in | Wt 164.0 lb

## 2016-07-07 DIAGNOSIS — G8254 Quadriplegia, C5-C7 incomplete: Secondary | ICD-10-CM

## 2016-07-07 DIAGNOSIS — N319 Neuromuscular dysfunction of bladder, unspecified: Secondary | ICD-10-CM

## 2016-07-07 DIAGNOSIS — R252 Cramp and spasm: Secondary | ICD-10-CM

## 2016-07-07 DIAGNOSIS — M62838 Other muscle spasm: Secondary | ICD-10-CM

## 2016-07-07 DIAGNOSIS — G8253 Quadriplegia, C5-C7 complete: Secondary | ICD-10-CM

## 2016-07-07 DIAGNOSIS — K592 Neurogenic bowel, not elsewhere classified: Secondary | ICD-10-CM

## 2016-07-07 MED ORDER — NON-SYSTEM MEDICATION *A*
5 refills | Status: DC
Start: 2016-07-07 — End: 2017-01-18

## 2016-07-07 MED ORDER — DISPOSABLE GLOVES MISC *A*
6 refills | Status: DC
Start: 2016-07-07 — End: 2017-05-25

## 2016-07-07 MED ORDER — DANTROLENE SODIUM 25 MG PO CAPS *I*
ORAL_CAPSULE | ORAL | 0 refills | Status: DC
Start: 2016-07-07 — End: 2016-07-07

## 2016-07-07 MED ORDER — GENERIC DME *A*
0 refills | Status: DC
Start: 2016-07-07 — End: 2017-11-05

## 2016-07-07 MED ORDER — DANTROLENE SODIUM 25 MG PO CAPS *I*
ORAL_CAPSULE | ORAL | 1 refills | Status: DC
Start: 2016-07-07 — End: 2017-02-17

## 2016-07-07 MED ORDER — DISPOSABLE UNDERPADS 30"X36" MISC *A*
5 refills | Status: DC
Start: 2016-07-07 — End: 2016-09-03

## 2016-07-07 NOTE — Patient Instructions (Signed)
-   Restart Dantrolene 25 mg daily for 3 days then take twice daily for 3 days then take it three times daily. You need blood work in 1 months to monitor your liver function.  - Will order home services for wound care  - We have ordered a Hoyer  - Take Senna/Colace at night and Suppository in the morning 30 minutes after your breakfast.  - Follow-up in 3 months.

## 2016-07-07 NOTE — Telephone Encounter (Signed)
[name]  Is calling to request script for 300 underpads, 1 box gloves(medium), 10 2000 CC drainage bags.      Patient is completely out of drainage bags.      Information can be faxed to [phone]    Fulton Reek can be reached at [phone]
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
## 2016-07-08 ENCOUNTER — Encounter: Payer: Self-pay | Admitting: Student in an Organized Health Care Education/Training Program

## 2016-07-08 ENCOUNTER — Ambulatory Visit: Payer: Self-pay | Admitting: Student in an Organized Health Care Education/Training Program

## 2016-07-08 VITALS — BP 104/70 | HR 96 | Temp 97.9°F | Wt 164.0 lb

## 2016-07-08 DIAGNOSIS — K5900 Constipation, unspecified: Secondary | ICD-10-CM

## 2016-07-08 MED ORDER — SENNOSIDES-DOCUSATE SODIUM 8.6-50 MG PO TABS *I*
2.0000 | ORAL_TABLET | Freq: Every day | ORAL | 5 refills | Status: DC | PRN
Start: 2016-07-08 — End: 2016-11-03

## 2016-07-08 NOTE — Patient Instructions (Signed)
Call us if you have any trouble.

## 2016-07-08 NOTE — Progress Notes (Signed)
[facility] Medicine Clinic: Progress Note    Chief Complaint: Follow-Up    Subjective:      Cindy Ochoa is a 30 yo female with hx of quadriparesis at c6, autonomic dysfunction, neurogenic bladder and bowel who presents today for follow-up.  She has had difficulty with her aide services recently including one aide who left her alone while she was in the shower.  Constipation has been doing well, miralax and percolace have been working well.  Nurses from wound care had asked what they do to calm her legs.  When she is laying down she notices her legs get very jumpy.  She states that the spasms travel up her legs to her stomach and then her hand shakes.  She has been on baclofen in the past which she states didn't work.  She had been on flexeril previously and we tried it at her last visit which did not work.  She was recently put on dantrolene for this by PMR.  She has been working as a Immunologist at [employer].      [place] still has fleeting thought about becoming pregnant   She is not sexually active at this time.  She is not currently on birth control.  She stated that she is not considering this right now and she would let us know if she was considering this in the future.  She will call Ob/Gyn for follow-up.    Medications:     Current Outpatient Prescriptions on File Prior to Visit   Medication Sig Dispense Refill    Non-System Medication Urostomy drainage bags change as needed. Dx N39.42 and  G82.54 10 each 5    disposable underpads 30"x36" (CHUX) Use 6 times daily and PRN. Dx N39.42  Incontinence without sensory awareness 300 each 5    disposable gloves 2 boxes Disposable Medium size gloves 200 each 6    generic DME Use as directed. Hoyer lift. Duration of use: lifetime. ICD:10-G82.53 1 each 0    dantrolene (DANTRIUM) 25 MG capsule 1 tablet orally once a day for 3 days then take twice a day for 3 days then take 3 times a day until follow-up. 44 capsule 1    Non-System Medication Leg Bag  1000mg  - diagnosis G82.54 21 each 4    mirtazapine (REMERON) 7.5 MG tablet Take 1 tablet (7.5 mg total) by mouth nightly 30 tablet 1    acetaminophen (MAPAP) 500 mg tablet Take 1 tablet (500 mg total) by mouth every 4-6 hours as needed   for pain 180 tablet 5    bisacodyl (BISAC-EVAC) 10 MG suppository 1-2 as needed every other day 50 suppository 3    senna-docusate (PERICOLACE) 8.6-50 MG per tablet Take 2 tablets by mouth daily as needed for Constipation 30 tablet 5    polyethylene glycol (GLYCOLAX) powder Take 17 g by mouth daily   Mix in 8 oz water or juice and drink. 255 g 1    naproxen sodium (ANAPROX) 220 MG tablet Take 1 tablet (220 mg total) by mouth 2 times daily (with meals) 14 tablet 0    generic DME Left fifth digit finger splint for sprain. 1 each 0    incontinence supply disposable Large pull ups - use up to 5 x daily  Dx N39.42 150 each 5    disposable gloves 2 boxes Disposable Medium size gloves 200 each 6    Non-System Medication Shower Chair - Quadriparesis G82.54 1 each 0    Ostomy  Supplies (HOLLISTER REPLACEMENT FILTERS) MISC [employer] Adapter (order # [id number]). 1 each 1    ARTIFICIAL TEARS 1.4 % ophthalmic solution PLACE 1 DROP INTO BOTH EYES FOUR TIMES DAILY 15 mL 10    Non-System Medication PRI AND SCREEN  DIAGNOSIS 344.04 Quadriparesis 1 each 0    white petrolatum-mineral oil (GENTEAL PM) 85-15 % ophthalmic ointment Place into both eyes nightly 3.5 mL 6    Skin Protectants, Misc. OINT Apply 1 Tube topically 2 times daily   1 Tube 11    Non-System Medication One Hoyer Lift  Diagnosis:  Quadriplegic ICD-9 344.04 1 each 0     Current Facility-Administered Medications on File Prior to Visit   Medication Dose Route Frequency Provider Last Rate Last Dose    lidocaine (XYLOCAINE,REGENECARE) 2 % jelly   Topical PRN Zollie Beckers, NP           Medications reviewed and reconciled.   Allergies:     Allergies   Allergen Reactions    Nitrofurantoin Nausea And Vomiting    Vancomycin Hives      hives 2006 but tolerated Rx in 2010  pt states she had vancomycin in 04/2012.  Feels this is not true allergy  as she has received it recently - had no reaction     Heparin Other (See Comments)     Thrombocytopenia;        Review of Systems:     Pertinent positives and negatives as per HPI.     Physical Exam:     Vitals:    07/08/16 1019   BP: 104/70   Pulse: 96   Temp: 36.6 C (97.9 F)   TempSrc: Temporal   Weight: 74.4 kg (164 lb)     Wt Readings from Last 3 Encounters:   07/08/16 74.4 kg (164 lb)   07/07/16 74.4 kg (164 lb)   06/26/16 74.6 kg (164 lb 6.4 oz)     Body mass index is 27.29 kg/(m^2).  BP Readings from Last 3 Encounters:   07/08/16 104/70   07/07/16 97/66   06/26/16 102/65       General: Well-appearing, NAD, AOx3  HEENT: NCAT  Lymphatics: No LAD  Pulmonary: CTA b/l, Normal WOB  Cardiovascular: RRR, normal S1/S2, no M/R/G  Abdominal: soft, nt, nd, +BS  Extremities: wwp, no edema  Skin: No rashes or lesions    Assessment and Plan:     Cindy Ochoa is a 30 yo female with hx of quadriparesis at c6, autonomic dysfunction, neurogenic bladder and bowel who presents today for follow-up.    Constipation     - continue miralax, percolace  - suppositories as needed  - continue regular diet    Leg Spasms    - dantorlene per PMR  - no longer taking flexeril    Ob/Gyn    - patient not currently trying to become pregnant, if this changes she will call us  - will have her schedule f/u with Ob/Gyn regarding birth control options as patient not currently sexually active but having some thoughts about it    RTC in 4 months    [name], MD   Internal Medicine, PGY-3

## 2016-07-10 ENCOUNTER — Other Ambulatory Visit: Payer: Self-pay | Admitting: Internal Medicine

## 2016-07-10 DIAGNOSIS — N319 Neuromuscular dysfunction of bladder, unspecified: Secondary | ICD-10-CM

## 2016-07-10 DIAGNOSIS — G8254 Quadriplegia, C5-C7 incomplete: Secondary | ICD-10-CM

## 2016-07-10 NOTE — Telephone Encounter (Signed)
I called [name] and they received these scripts yesterday.  I notified the patient.

## 2016-07-10 NOTE — Telephone Encounter (Signed)
Symiah Rafferty calling to request prescription(s) Non-System Medication,disposable gloves,disposable underpads 30"x36" ([id number] be sent to [facility].    Is patient out of the medication? yes  Does the patient have questions regarding the medication for the nurse? no    Patient can be reached if necessary at [phone].

## 2016-07-13 ENCOUNTER — Telehealth: Payer: Self-pay | Admitting: Internal Medicine

## 2016-07-13 NOTE — Telephone Encounter (Signed)
Patient asking for status of home care referral to check her wound.

## 2016-07-13 NOTE — Telephone Encounter (Signed)
Patient is calling to follow up on the referral to home care.    Patient can reached at [phone]

## 2016-07-13 NOTE — Telephone Encounter (Signed)
Spoke to Cindy Ochoa at [facility].   According to [facility] in order for pt to get home care to be covered the last doctors office note has to mention the wound and their suggestions.  Will send to the provider for advisement

## 2016-07-13 NOTE — Telephone Encounter (Signed)
Anderson Malta with [facility] called requesting to speak with Emily,RN in regards to [place] can be reached at [phone]

## 2016-07-13 NOTE — Telephone Encounter (Signed)
Spoke with pt regarding her wound care.  Pt states that the length of time between her wound clinic apts is too long and that her aids are not trained on wound care.   Pt is requesting to have nurses come from [facility] home care to check on her wounds and teach her aids how to care for the wounds properly.   Pt is concerned the wound on her tail bone that is "very deep"  Writer informed the pt that she would talk to [facility] care  Pt verbalized understanding    Writer spoke to [facility] home care referral team, Geni Bers.   Geni Bers stated that they would be out to see the pt in a couple days    Writer called pt back and told her [facility] home care would be out in a few days to see her.   Pt verbalized understanding

## 2016-07-14 ENCOUNTER — Ambulatory Visit: Payer: Self-pay | Admitting: Student in an Organized Health Care Education/Training Program

## 2016-07-15 ENCOUNTER — Ambulatory Visit: Payer: Self-pay | Admitting: Orthopedic Surgery

## 2016-07-16 ENCOUNTER — Telehealth: Payer: Self-pay | Admitting: Internal Medicine

## 2016-07-16 NOTE — Telephone Encounter (Signed)
Spoke with [place] to find out why patient is not being opened until next week?   Spoke with Diane who reported patient requested open date of  07/20/16.

## 2016-07-16 NOTE — Telephone Encounter (Signed)
Diane is calling to let doctor know that start of care will be postponed until Monday.    Diane can be reached at [phone]

## 2016-07-17 ENCOUNTER — Encounter: Payer: Self-pay | Admitting: Gastroenterology

## 2016-07-17 ENCOUNTER — Ambulatory Visit
Admission: RE | Admit: 2016-07-17 | Discharge: 2016-07-17 | Disposition: A | Discharge status: Home / Self Care | Payer: Self-pay | Source: Ambulatory Visit | Attending: Physical Medicine and Rehabilitation | Admitting: Physical Medicine and Rehabilitation

## 2016-07-20 ENCOUNTER — Telehealth: Payer: Self-pay | Admitting: Internal Medicine

## 2016-07-20 NOTE — Telephone Encounter (Signed)
FYI: Patient was opened to home care services today 7.24.17    If necessary please contact Wells Guiles at [phone]

## 2016-07-20 NOTE — Telephone Encounter (Signed)
Cindy Ochoa with [facility] calling to verify if patient needs to be taking Mirtazaprine.    Patient stated she has not taken it and if she needs to a new script should be sent to the pharmacy.     Please contact Cindy Ochoa at [phone]

## 2016-07-21 ENCOUNTER — Telehealth: Payer: Self-pay | Admitting: Internal Medicine

## 2016-07-21 NOTE — Telephone Encounter (Signed)
Referral was based on office visit from 7/12. Patient was opened to home care yesterday 7.24 but the referral does not mention wound care.    Cindy Ochoa is seeking an amendment to the referral from the note on 7.12    Cindy Ochoa can be reached at [phone]

## 2016-07-21 NOTE — Telephone Encounter (Signed)
Spoke with Maudie Mercury.  Renold Don of [facility] from 06/17/16 where wound is discussed.  Kim verbalized understanding.

## 2016-07-21 NOTE — Telephone Encounter (Signed)
Left message for Cindy Ochoa/[facility] regarding pt's wound care.

## 2016-07-28 ENCOUNTER — Telehealth: Payer: Self-pay

## 2016-07-28 ENCOUNTER — Encounter: Payer: Self-pay | Admitting: Gastroenterology

## 2016-07-28 NOTE — Telephone Encounter (Signed)
Since the patient works they are unable to keep her open to home care at this time.  I verbalized understanding and informed Diane that we were not aware the patient was open to home care as we were under the impression her aids were performing the wound care previously.  Diane informed me that the primary is the one who requested her to be opened to nursing service for wound care and was in the process of ordering more supplies for the patient but the supplies have not been ordered as of yet.  Diane will call back tomorrow to verify that it is okay to discharge the patient from wound care.  We both verbalized agreement to the plan.  Harvel Quale, RN

## 2016-07-28 NOTE — Telephone Encounter (Signed)
Spoke with Shauna Hugh, nurse who visits the patient.  Diane verbalized that she has only been able to visit the patient once due to the patient only being available certain times of the day as she works.

## 2016-07-28 NOTE — Telephone Encounter (Signed)
Diane called to touch bases with a nurse regarding Cindy Ochoa. Please call Diane at [phone].

## 2016-07-29 ENCOUNTER — Telehealth: Payer: Self-pay | Admitting: Urology

## 2016-07-29 ENCOUNTER — Encounter: Payer: Self-pay | Admitting: Surgery

## 2016-07-29 ENCOUNTER — Ambulatory Visit: Payer: Self-pay | Admitting: Surgery

## 2016-07-29 ENCOUNTER — Telehealth: Payer: Self-pay

## 2016-07-29 ENCOUNTER — Other Ambulatory Visit
Admission: RE | Admit: 2016-07-29 | Discharge: 2016-07-29 | Disposition: A | Discharge status: Home / Self Care | Payer: Self-pay | Source: Ambulatory Visit | Attending: Urology | Admitting: Urology

## 2016-07-29 VITALS — BP 118/63 | HR 82 | Temp 97.6°F | Resp 16 | Ht 65.0 in | Wt 162.0 lb

## 2016-07-29 DIAGNOSIS — N390 Urinary tract infection, site not specified: Secondary | ICD-10-CM

## 2016-07-29 DIAGNOSIS — L89154 Pressure ulcer of sacral region, stage 4: Secondary | ICD-10-CM

## 2016-07-29 NOTE — Telephone Encounter (Signed)
Diane from [facility] called to speak with Delsa Sale who she spoke with yesterday. Please call Diane at [phone].

## 2016-07-29 NOTE — Patient Instructions (Addendum)
Please follow these instructions about how to care for your wound.              Wound Location Dressing Orders Frequency of Dressing Change   Location: Sacral Calmoseptine to peri wound  Aquacel  ABD pad  Tape      Vitamin a+d to dry areas [x]  Twice daily       Cleanse Wound(s) with:    [x]  Soap & Water with dressing changes, keep dressing dry in between       [x]  Leg Elevation: Elevate leg(s) to level of heart   or above for 30 minutes 3 times per day.  [x]  Right   Leg [x]  Left   Leg       [x]  Assistive Device(s): [x]  Wheelchair    [x]  Pressure Reduction: [x]  Wheelchair Cushion   [x]  Specialty Bed [x]  Reposition every 2 hours   [x]  Limit time up in your chair to 2 hours or less at a time      [x]  General: [x]  Stop / Decrease Smoking           []   Discharge from [facility] Discharge Instructions    Following the instructions below will give you the best opportunity for wound healing.    Wound Care Instructions (If changing own dressing):   Gather all supplies you will need to change your dressing.   Before changing your dressing, wash your hands for at least 15 seconds with warm soapy water.   Rinse off all soap, then dry with a towel.   Remove the old dressing.  Wash hands again before applying the new dressing.    If you experience any of the following during our business hours of 8 AM - 4:30 PM, Monday - Friday, please call the [facility] at [phone]:    ? Increase in pain  ?Temperature over 101F   ?Drainage with a foul odor   ?Bleeding   ? Increase in swelling  ? Increase in drainage from your wound  ? Need for compression bandage changes (slippage, breakthrough drainage)    Please contact your primary care physician or proceed to the nearest emergency room if you experience any of the above after our business hours.     Please note your appointment(s) above - if you are unable to keep, kindly give 24 hours' notice.  Thank You!

## 2016-07-29 NOTE — Telephone Encounter (Signed)
Spoke with Diane and informed her she can discharge the patient from wound care.  Diane verbalized understanding and is agreeable to plan.  Harvel Quale, RN

## 2016-07-29 NOTE — Telephone Encounter (Signed)
Patient catheterizes, will wait for culture to treat in the absence of fever. Encourage fluids in the meantime, Thanks

## 2016-07-29 NOTE — Telephone Encounter (Signed)
Cindy Ochoa calling to report that she is experiencing sweats and smelly urine. These symptoms have been going on for 2 day(s)      Patient requesting call back? yes    Phone number confirmed at [phone]    She would like to drop off a urine sample.

## 2016-07-29 NOTE — Telephone Encounter (Signed)
Called and spoke with pt. Pt dropped off a sample already today, obviously still in process. Pt denies fevers, but is sweating. Pt states she has a urostomy bag, so no urgency, frequency, or burning. Please advise
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
## 2016-07-29 NOTE — Telephone Encounter (Signed)
Called and left message on machine that we will wait for results and to call back in 2 days for the results and to stay well hydrated

## 2016-07-30 ENCOUNTER — Telehealth: Payer: Self-pay | Admitting: Internal Medicine

## 2016-07-30 NOTE — Telephone Encounter (Signed)
Message noted.

## 2016-07-30 NOTE — Telephone Encounter (Signed)
Diane with [facility] calling to inform the doctor that patient has been discharged from service as of today.     If any questions please contact her at [phone]

## 2016-07-31 ENCOUNTER — Ambulatory Visit: Payer: Self-pay | Admitting: Urology

## 2016-07-31 ENCOUNTER — Telehealth: Payer: Self-pay | Admitting: Urology

## 2016-07-31 DIAGNOSIS — N390 Urinary tract infection, site not specified: Secondary | ICD-10-CM | POA: Insufficient documentation

## 2016-07-31 LAB — AEROBIC CULTURE: Aerobic Culture: 0

## 2016-07-31 NOTE — Telephone Encounter (Signed)
Spoke with Barnett Applebaum.   Having chills, body doesn't feel right. Her urine is dark even though she drinks water all day.   I hate when they do this "I know when there's something wrong with my body".   Please advise if you'll be sending antibiotics to the pharmacy prior to culture results and the weekend.     Called [facility] and requested patient come to the [facility] office for a catheterized urine sample today.   Kathlene Cote, RN agreed to afternoon time. Patient will be coming at 1 PM today.     Urology Staff,   Please put the patient on the schedule for today at 1 PM.

## 2016-07-31 NOTE — Telephone Encounter (Signed)
Patient is able to self catheterize, but that's fine. I'll call in a prescription for bactrim after the sample is submitted.    When she comes into clinic, please determine if she is using sterile catheters. She was reusing catheters and that is likely contributing to her infections. She needs to call [facility] for more supplies, but we can give her samples.

## 2016-07-31 NOTE — Telephone Encounter (Signed)
Aerobic Culture .    Resulting Agency [facility]   Narrative     Mixed flora suggestive of contamination  Total colony count of: >100,000/ml

## 2016-07-31 NOTE — Telephone Encounter (Signed)
Please inform patient that urine culture was contaminated.   She really needs a repeat urine culture via catheterization - sterile technique.       She called me early this morning and I was not by a computer at the time to look up any labs. Please also triage, depending on her symptoms we can initiate treatment once culture is completed given that we are going into the weekend.

## 2016-07-31 NOTE — Progress Notes (Signed)
Cath urine sample obtained by [facility].     Maida Sale, NP  07/31/2016  6:52 PM

## 2016-07-31 NOTE — Telephone Encounter (Signed)
Cindy Ochoa is requesting a return call to review her recent test results.    What type of test was done? Urine    Where was the test done?     On what day was the test done? 8/1    Please return the patients call at [phone].

## 2016-07-31 NOTE — Telephone Encounter (Signed)
PATIENT SCHEDULED FOR 07/31/2016 AT 1:00PM . THANK YOU

## 2016-08-01 LAB — AEROBIC CULTURE

## 2016-08-03 ENCOUNTER — Ambulatory Visit: Payer: Self-pay | Admitting: Internal Medicine

## 2016-08-04 ENCOUNTER — Other Ambulatory Visit: Payer: Self-pay | Admitting: Physical Medicine and Rehabilitation

## 2016-08-04 DIAGNOSIS — G8253 Quadriplegia, C5-C7 complete: Secondary | ICD-10-CM

## 2016-08-04 DIAGNOSIS — R252 Cramp and spasm: Secondary | ICD-10-CM

## 2016-08-04 NOTE — Telephone Encounter (Signed)
Request for refill sent to provider on 08/04/2016 at 10:40 AM.

## 2016-08-05 ENCOUNTER — Telehealth: Payer: Self-pay | Admitting: Urology

## 2016-08-05 ENCOUNTER — Encounter: Payer: Self-pay | Admitting: Gastroenterology

## 2016-08-05 ENCOUNTER — Telehealth: Payer: Self-pay | Admitting: Internal Medicine

## 2016-08-05 DIAGNOSIS — G8253 Quadriplegia, C5-C7 complete: Secondary | ICD-10-CM

## 2016-08-05 MED ORDER — NON-SYSTEM MEDICATION *A*
0 refills | Status: DC
Start: 2016-08-05 — End: 2017-11-05

## 2016-08-05 NOTE — Telephone Encounter (Signed)
Called patient, I believe that I left a VM, but there was no greeting message so I am unsure. Mobile phone number is disconnected.    I would like to treat with Cipro, last culture resistant to Bactrim. I would like to clear the urine and offer prophylactic antibiotics given her frequent UTI history and recent hospitalization, but I will only do this if she is able to assure me that she will not reuse catheters. I can submit a new order to [facility] if she has not contacted them yet if supplies are a concern.    Urology nurse/staff, please warm transfer if patient calls.

## 2016-08-05 NOTE — Telephone Encounter (Signed)
Received Request from [name] [place] for:    new Hoyer lift with sling    "Replacement Legrest assy parts for [employer].    Orders written and need to be FAXed to [facility] along with detailed orders signed today.    [name], MD

## 2016-08-05 NOTE — Telephone Encounter (Signed)
The message below from [name] Hyland, NP was sent to the patient via MyChart.

## 2016-08-06 ENCOUNTER — Encounter: Payer: Self-pay | Admitting: Gastroenterology

## 2016-08-06 ENCOUNTER — Encounter: Payer: Self-pay | Admitting: Internal Medicine

## 2016-08-06 ENCOUNTER — Ambulatory Visit: Payer: Self-pay | Admitting: Internal Medicine

## 2016-08-06 VITALS — BP 100/66 | HR 100 | Temp 96.6°F | Ht 65.0 in | Wt 162.0 lb

## 2016-08-06 DIAGNOSIS — Z0000 Encounter for general adult medical examination without abnormal findings: Secondary | ICD-10-CM

## 2016-08-06 DIAGNOSIS — G8253 Quadriplegia, C5-C7 complete: Secondary | ICD-10-CM

## 2016-08-06 DIAGNOSIS — F17200 Nicotine dependence, unspecified, uncomplicated: Secondary | ICD-10-CM

## 2016-08-06 DIAGNOSIS — J40 Bronchitis, not specified as acute or chronic: Secondary | ICD-10-CM

## 2016-08-06 DIAGNOSIS — N390 Urinary tract infection, site not specified: Secondary | ICD-10-CM

## 2016-08-06 MED ORDER — IPRATROPIUM-ALBUTEROL 0.5-2.5 MG/3ML IN SOLN *I*
3.0000 mL | Freq: Once | RESPIRATORY_TRACT | Status: AC
Start: 2016-08-06 — End: 2016-08-06
  Administered 2016-08-06: 3 mL via RESPIRATORY_TRACT

## 2016-08-06 MED ORDER — EASIVENT MISC *I*
0 refills | Status: DC
Start: 2016-08-06 — End: 2017-11-05

## 2016-08-06 MED ORDER — ALBUTEROL SULFATE HFA 108 (90 BASE) MCG/ACT IN AERS *I*
1.0000 | INHALATION_SPRAY | Freq: Four times a day (QID) | RESPIRATORY_TRACT | 0 refills | Status: DC | PRN
Start: 2016-08-06 — End: 2017-09-17

## 2016-08-06 MED ORDER — NITROFURANTOIN MONOHYD MACRO 100 MG PO CAPS *I*
100.0000 mg | ORAL_CAPSULE | Freq: Two times a day (BID) | ORAL | 0 refills | Status: DC
Start: 2016-08-06 — End: 2017-02-17

## 2016-08-07 ENCOUNTER — Encounter: Payer: Self-pay | Admitting: Gastroenterology
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
## 2016-08-10 ENCOUNTER — Ambulatory Visit: Payer: Self-pay | Admitting: Sleep Medicine

## 2016-08-10 ENCOUNTER — Encounter: Payer: Self-pay | Admitting: Gastroenterology

## 2016-08-11 ENCOUNTER — Telehealth: Payer: Self-pay | Admitting: Sleep Medicine

## 2016-08-11 NOTE — Telephone Encounter (Signed)
Confirmed 08/13/16 appt with patient

## 2016-08-12 ENCOUNTER — Encounter: Payer: Self-pay | Admitting: Gastroenterology

## 2016-08-13 ENCOUNTER — Ambulatory Visit: Payer: Self-pay | Admitting: Sleep Medicine

## 2016-08-13 ENCOUNTER — Telehealth: Payer: Self-pay | Admitting: Internal Medicine

## 2016-08-13 NOTE — Telephone Encounter (Signed)
Silvana will refax to me directly for Dr. Earlie Server signature.

## 2016-08-13 NOTE — Telephone Encounter (Signed)
Silvana with [facility] called in regards to needing completed orders returned for patients home care to continue    Please fax orders to [phone]    Tonye Becket can be reached at [phone]

## 2016-08-17 ENCOUNTER — Ambulatory Visit: Payer: Self-pay | Admitting: Surgery

## 2016-08-17 NOTE — Telephone Encounter (Signed)
Just confirmed with Barb at [facility] they were received 8/17.  Contacted patient and notified her by voice mail.

## 2016-08-17 NOTE — Telephone Encounter (Signed)
Oriel calling to let Roby Lofts know she spoke to [facility] and everything is all set.

## 2016-08-17 NOTE — Telephone Encounter (Signed)
Patient is calling to follow up on orders for aid service.    Patient states 8.31.2017 the aid service is scheduled to end.    Patient is very concerned.    Writer let patient know that orders were scheduled to be faxed on 8.17    Patient can be reached at [phone] (H)

## 2016-08-17 NOTE — Telephone Encounter (Signed)
Called patient - form was faxed to [facility] 8/16 - refaxed to [facility] and left message for Barb/Silvana at [facility] to confirm receipt.

## 2016-09-03 ENCOUNTER — Other Ambulatory Visit: Payer: Self-pay | Admitting: Internal Medicine

## 2016-09-03 DIAGNOSIS — G8250 Quadriplegia, unspecified: Secondary | ICD-10-CM

## 2016-09-03 MED ORDER — DISPOSABLE UNDERPADS 30"X36" MISC *A*
5 refills | Status: DC
Start: 2016-09-03 — End: 2017-01-18

## 2016-09-03 MED ORDER — INCONTINENCE SUPPLY DISPOSABLE MISC *A*
5 refills | Status: DC
Start: 2016-09-03 — End: 2017-01-18

## 2016-09-03 MED ORDER — GENERIC DME *A*
0 refills | Status: DC
Start: 2016-09-03 — End: 2017-11-05

## 2016-09-03 NOTE — Telephone Encounter (Signed)
Patient is calling to state that she would like her supplies to be sent to [facility] going forward.    Supplies include underpads, pull ups( brief), leg bags, gloves and drainage bags.    Patient does not need a refill but states that she has had problems with [facility] surgical.    Patient can be reached at [phone]

## 2016-09-07 ENCOUNTER — Telehealth: Payer: Self-pay | Admitting: Internal Medicine

## 2016-09-07 MED ORDER — NON-SYSTEM MEDICATION *A*
0 refills | Status: DC
Start: 2016-09-07 — End: 2020-03-21

## 2016-09-07 NOTE — Telephone Encounter (Signed)
Patient is calling to inform the doctor that her hospital bed broke around 4 days ago.     The air mattress part has a hole in it and the nurse cannot locate the hole.     Patient is requesting a script for a new hospital bed and air mattress be sent to [facility].     She has been sleeping on a regular bed which is not helping her bed sores but making them drain more.     If necessary please contact her at [phone].

## 2016-09-07 NOTE — Telephone Encounter (Signed)
Cindy Ochoa is returning a call from Roby Lofts. She is requesting a call back at [phone].Marland Kitchen

## 2016-09-07 NOTE — Telephone Encounter (Signed)
Left message for patient to call me need additional information.  How long did she have bed?  Has she contacted [name]?

## 2016-09-08 ENCOUNTER — Ambulatory Visit: Payer: Self-pay | Admitting: Obstetrics and Gynecology

## 2016-09-08 NOTE — Telephone Encounter (Signed)
Faxed to [name] and patient notified.

## 2016-09-12 ENCOUNTER — Other Ambulatory Visit: Payer: Self-pay | Admitting: Physical Medicine and Rehabilitation

## 2016-09-12 DIAGNOSIS — R252 Cramp and spasm: Secondary | ICD-10-CM

## 2016-09-12 DIAGNOSIS — G8253 Quadriplegia, C5-C7 complete: Secondary | ICD-10-CM

## 2016-09-14 NOTE — Telephone Encounter (Signed)
Request for refill sent to covering provider on 09/14/2016 at 9:27 AM.    Message additionally routed for purposes of scheduling FUV with Dr Priscille Loveless [facility]

## 2016-09-15 ENCOUNTER — Telehealth: Payer: Self-pay | Admitting: Internal Medicine

## 2016-09-15 NOTE — Telephone Encounter (Signed)
Cindy Ochoa is calling to report that he received a script for air mattress and hospital bed but [facility] wheel chair no longer provides air mattress anymore.  Hospital bed can be supplied.    Cindy Ochoa can be reached at [phone]

## 2016-09-15 NOTE — Telephone Encounter (Addendum)
Per [facility] can supply bed, mattress and a gel overlay but do not have an air mattress.  Left message for patient to call back for clarification.  The gel overlay will prevent bed sores.

## 2016-09-15 NOTE — Telephone Encounter (Signed)
Will refax to [facility] at [phone] - they state she can get both bed and mattress.

## 2016-09-15 NOTE — Telephone Encounter (Signed)
Cindy Ochoa is calling to report that [name] cannot provide her with an air mattress but can supply the hospital bed.  She found out that she needs to get all of her supplies through one place so is asking if Dr. Hassell Done could order these items from another supplier.

## 2016-09-16 ENCOUNTER — Telehealth: Payer: Self-pay | Admitting: Urology

## 2016-09-16 NOTE — Telephone Encounter (Signed)
Called patient. Instructed her to call [facility] for interim supplies and request to update script. She will do so today.

## 2016-09-16 NOTE — Telephone Encounter (Signed)
Patient She is calling to get more ostomy bags. She is only getting 10 per month but she is needing about 20 bags. She gets them from [employer]. She has 4 left. Patient can be reached at [phone]    Per message by Jana Half

## 2016-09-17 MED ORDER — NON-SYSTEM MEDICATION *A*
0 refills | Status: DC
Start: 2016-09-17 — End: 2017-11-05

## 2016-09-17 NOTE — Telephone Encounter (Signed)
Will refax script - patient wants hospital bed (bed broken and old) with gel overlay to prevent bedsores.

## 2016-09-17 NOTE — Telephone Encounter (Signed)
Marya Amsler from [name] returning a call to [employer].     Please contact him back at [phone] ext. [place]

## 2016-09-17 NOTE — Telephone Encounter (Signed)
Signed and placed in [facility] folder    Foreman, NP

## 2016-09-18 ENCOUNTER — Other Ambulatory Visit
Admission: RE | Admit: 2016-09-18 | Discharge: 2016-09-18 | Disposition: A | Discharge status: Home / Self Care | Payer: Self-pay | Source: Ambulatory Visit | Attending: Physical Medicine and Rehabilitation | Admitting: Physical Medicine and Rehabilitation

## 2016-09-18 DIAGNOSIS — G8253 Quadriplegia, C5-C7 complete: Secondary | ICD-10-CM

## 2016-09-18 LAB — HEPATIC FUNCTION PANEL
ALT: 13 U/L (ref 0–35)
AST: 13 U/L (ref 0–35)
Albumin: 3.8 g/dL (ref 3.5–5.2)
Alk Phos: 66 U/L (ref 35–105)
Bilirubin,Direct: <0.2 mg/dL (ref 0.0–0.3)
Bilirubin,Total: <0.2 mg/dL (ref 0.0–1.2)
Total Protein: 6.9 g/dL (ref 6.3–7.7)

## 2016-09-18 NOTE — Telephone Encounter (Signed)
I called Marya Amsler at [name].  He said he did not need the gel mattress overlay script. He said he put the order through yesterday without it and it is currently in process.

## 2016-09-22 ENCOUNTER — Other Ambulatory Visit: Payer: Self-pay

## 2016-09-22 ENCOUNTER — Other Ambulatory Visit
Admission: RE | Admit: 2016-09-22 | Discharge: 2016-09-22 | Disposition: A | Discharge status: Home / Self Care | Payer: Self-pay | Source: Ambulatory Visit | Attending: Urology | Admitting: Urology

## 2016-09-22 DIAGNOSIS — N390 Urinary tract infection, site not specified: Secondary | ICD-10-CM

## 2016-09-23 ENCOUNTER — Telehealth: Payer: Self-pay | Admitting: Urology

## 2016-09-23 DIAGNOSIS — N390 Urinary tract infection, site not specified: Secondary | ICD-10-CM

## 2016-09-23 LAB — AEROBIC CULTURE

## 2016-09-23 NOTE — Telephone Encounter (Signed)
Unable to reach pt at this time. Per HIPPA unable to leave message.

## 2016-09-23 NOTE — Telephone Encounter (Signed)
Testing in progress, results will not be back until tomorrow.

## 2016-09-23 NOTE — Telephone Encounter (Signed)
Cindy Ochoa is requesting a return call to review her recent test results.    What type of test was done? Aerobic Culture    Where was the test done? Office    On what day was the test done? 9/26    Please return the patients call at [phone].

## 2016-09-24 ENCOUNTER — Other Ambulatory Visit: Payer: Self-pay | Admitting: Urology

## 2016-09-24 MED ORDER — AMPICILLIN 500 MG PO CAPS *I*
500.0000 mg | ORAL_CAPSULE | Freq: Four times a day (QID) | ORAL | 0 refills | Status: AC
Start: 2016-09-24 — End: 2016-10-01

## 2016-09-24 NOTE — Telephone Encounter (Signed)
Ms. Burgeson mother [name] is requesting a return call to review her recent test results.    What type of test was done? Urine    Where was the test done? [facility]    On what day was the test done? 09/22/16    Please return the patients mother call at [phone].

## 2016-09-24 NOTE — Telephone Encounter (Signed)
Patient called back. Informed of message below. No further questions at this time.

## 2016-09-24 NOTE — Telephone Encounter (Signed)
Patient is calling in regards to message below. Please call her back at [phone].

## 2016-09-24 NOTE — Telephone Encounter (Signed)
Returned call to [place], mother. Urology HIPAA reviewed.   Informed culture is positive and antibiotics have been sent to her pharmacy.

## 2016-09-24 NOTE — Telephone Encounter (Signed)
Patient requesting culture results.    Aerobic culture   Order: [id number]   Status:  Final result Visible to patient:  Yes [facility] MyChart) Dx:  Recurrent UTI      2d ago     Aerobic Culture Escherichia coli (!)   Comments: >100,000/ml   Resulting Agency [facility]   Susceptibility    Escherichia coli     MIC     Amikacin Sensitive     Ampicillin Sensitive     Aztreonam Sensitive     Cefazolin Sensitive     Cefepime Sensitive     Ceftriaxone Sensitive     Ciprofloxacin Resistant     Ertapenem Sensitive     ESBL -     Gentamicin Sensitive     Meropenem Sensitive     Nitrofurantoin Sensitive     Piperacillin/Tazobactam Sensitive     Tobramycin Sensitive     Trimethoprim/Sulfa Resistant

## 2016-09-24 NOTE — Telephone Encounter (Signed)
Called the patient and left a message on voice mail requested a return call to the [facility].  Urology HIPAA reviewed and does not allow detailed messages.    The message below was sent to the patient via [employer].     Notes Recorded by Marilynn Latino, NP on 09/24/2016 at 12:58 PM  Ordered Ampicillin x7 days, please inform patient. Thanks!

## 2016-10-05 ENCOUNTER — Encounter: Payer: Self-pay | Admitting: Gastroenterology

## 2016-10-06 ENCOUNTER — Telehealth: Payer: Self-pay | Admitting: Physical Medicine and Rehabilitation

## 2016-10-06 ENCOUNTER — Other Ambulatory Visit: Payer: Self-pay | Admitting: Physical Medicine and Rehabilitation

## 2016-10-06 ENCOUNTER — Ambulatory Visit: Payer: Self-pay

## 2016-10-06 DIAGNOSIS — R252 Cramp and spasm: Secondary | ICD-10-CM

## 2016-10-06 DIAGNOSIS — G8253 Quadriplegia, C5-C7 complete: Secondary | ICD-10-CM

## 2016-10-06 MED ORDER — DANTROLENE SODIUM 25 MG PO CAPS *I*
25.0000 mg | ORAL_CAPSULE | Freq: Three times a day (TID) | ORAL | 2 refills | Status: DC
Start: 2016-10-06 — End: 2016-11-03

## 2016-10-06 NOTE — Telephone Encounter (Signed)
Called patient to give me a call back about schedule appt to come see Dr.salim in nov

## 2016-10-06 NOTE — Telephone Encounter (Signed)
Patient called to cancel appointment, she is requesting a reschedule for this week as she states she needs the medication. Please call back at [phone]

## 2016-10-07 NOTE — Telephone Encounter (Signed)
Patient's mother [name] calling stating patient is still symptomatic. States she finished medication. Denise requesting call back at [phone] to advise.

## 2016-10-07 NOTE — Telephone Encounter (Signed)
Cindy Ochoa,   Patient finished second course of antibiotics for urinary tract infection in about a month, her symptoms have not resolved. Complaining of chills and sweats. 9/10 intermittent bladder spasms, so bad that cause chest tightness. Denies fever or hematuria. Asked to give another urine sample to lab, and keep well hydrated. Please advise. Thanks [employer].

## 2016-10-07 NOTE — Telephone Encounter (Signed)
Spoke to the pt. This pt has been scheduled on 10/30. This pt states that she has been having issues with constipation, attempted to transfer to a nurse. Please contact the pt.

## 2016-10-07 NOTE — Telephone Encounter (Signed)
Called patient, left VM.     Cindy Ochoa, this is good action for now. Best to start with culture for now as she was appropriately treated. We should not treat with antibiotics right now. From my understanding, chills/sweats are not new for the patient and could be related to other sources of autonomic dysreflexia. When she calls back, please assess if she has any skin break down or problems with constipation.     She should also be scheduled for FUV.

## 2016-10-07 NOTE — Telephone Encounter (Signed)
Called and spoke with Cindy Ochoa, ok per hippa, states patient is taking stool softener for constipation, and her BM are regular and soft, denies any skin break down, states patient will give urine sample to lab tomorrow morning. Thanks [employer].

## 2016-10-07 NOTE — Telephone Encounter (Signed)
Staff,   Please call and schedule patient for follow up appointment. Please ask if she has constipation issues or any skin break down. Thanks [employer].

## 2016-10-08 ENCOUNTER — Other Ambulatory Visit: Payer: Self-pay | Admitting: Urology

## 2016-10-08 DIAGNOSIS — Z87442 Personal history of urinary calculi: Secondary | ICD-10-CM

## 2016-10-08 NOTE — Telephone Encounter (Signed)
Called patient, left VM.     I would like for the patient to be scheduled for CT scan in advance of next appointment to make sure she has no stones that could be causing her symptoms.     Staff, please call to schedule CT, order is placed.

## 2016-10-09 ENCOUNTER — Other Ambulatory Visit
Admission: RE | Admit: 2016-10-09 | Discharge: 2016-10-09 | Disposition: A | Discharge status: Home / Self Care | Payer: Self-pay | Source: Ambulatory Visit | Attending: Urology | Admitting: Urology

## 2016-10-09 DIAGNOSIS — N390 Urinary tract infection, site not specified: Secondary | ICD-10-CM

## 2016-10-09 LAB — URINALYSIS WITH MICROSCOPIC
Ketones, UA: NEGATIVE
Leuk Esterase,UA: NEGATIVE
Nitrite,UA: NEGATIVE
Protein,UA: 30 mg/dL — AB
RBC,UA: 7 /hpf — AB (ref 0–2)
Specific Gravity,UA: 1.013 (ref 1.002–1.030)
WBC,UA: 69 /hpf — AB (ref 0–5)
pH,UA: 7 (ref 5.0–8.0)

## 2016-10-09 NOTE — Telephone Encounter (Signed)
PULLED ORDER

## 2016-10-10 LAB — AEROBIC CULTURE: Aerobic Culture: 0

## 2016-10-12 ENCOUNTER — Telehealth: Payer: Self-pay | Admitting: Urology

## 2016-10-12 LAB — AEROBIC CULTURE

## 2016-10-12 NOTE — Telephone Encounter (Signed)
She has several different bacteria in her urine which is not unusual for someone with a ileal loop.  Unless she has a fever/chil/hematuria we usually don't treat.  Thanks

## 2016-10-12 NOTE — Telephone Encounter (Signed)
Patient states that she reviewed her results on MyChart and is a little confused by some of the terminology listed. She would like for a nurse to explain the results to her and is requesting a call back at [phone] to discuss.

## 2016-10-12 NOTE — Telephone Encounter (Signed)
Results   Aerobic culture (Order [id number])   Result Information   Specimen Type: VOIDED   Specimen Source: Urine (Clean catch, voided, midstream)    Collected: 10/09/2016 8:55 AM         Aerobic culture   Order: [id number]   Status:  Preliminary result Visible to patient:  No (Not Released) Dx:  Recurrent UTI      3d ago     Aerobic Culture Klebsiella pneumoniae (!)P   Comments: 10,000/ml   Aerobic Culture Enterococcus faecalis (!)P   Comments: [id number]   Uncomplicated urinary tract infections caused by   Enterococcus are usually responsive to Ampicillin. Serious   infections caused by Enterococcus are usually treated with   combination therapy. Consult Infectious Diseases.   .   Vancomycin restricted at [facility]: Requires approval of   Infectious Diseases (pager [phone]) or initial order   will be discontinued at 72 hours.   .      Aerobic Culture Escherichia coli (!)P   Comments: <10,000/ml   Further susceptibilities to follow      Resulting Agency [place]   Susceptibility    Klebsiella pneumoniae Enterococcus faecalis Escherichia coli     KB DISK MIC MIC MIC     Amikacin  Sensitive  Sensitive     Ampicillin  Resistant Resistant Resistant     Aztreonam  Sensitive  Sensitive     Cefazolin  Sensitive  Sensitive     Cefepime  Sensitive  Sensitive     Ceftriaxone  Sensitive  Sensitive     Ciprofloxacin  Sensitive Resistant Sensitive     Ertapenem  Sensitive  Sensitive     ESBL  -  -     Gentamicin  Sensitive  Sensitive     Imipenem Sensitive        Levofloxacin Sensitive  Resistant      Linezolid   Sensitive      Meropenem  Sensitive  Sensitive     Nitrofurantoin  Intermediate Sensitive Sensitive     Penicillin-G   Resistant      Piperacillin/Tazobactam  Sensitive  Sensitive     Tetracycline   Resistant      Tobramycin  Sensitive  Sensitive     Trimethoprim/Sulfa  Sensitive  Resistant     Vancomycin   Sensitive                     Specimen Collected: 10/09/16 8:55 AM Last Resulted: 10/11/16 10:12 PM

## 2016-10-12 NOTE — Telephone Encounter (Signed)
Called and spoke with pt and advised of message below. Pt denies fever, chills, or hematuria. Pt states that she will call the office if she develops any of those symptoms

## 2016-10-13 ENCOUNTER — Telehealth: Payer: Self-pay | Admitting: Urology

## 2016-10-13 NOTE — Telephone Encounter (Signed)
Imaging sent out for scheduling at [facility]

## 2016-10-15 NOTE — Telephone Encounter (Signed)
Prior Authorization/  ReferenceNumber: AUTH NOT OBTAINED    Type:  CT scan    Location:  [facility]    Date:  Thursday November 05 2016    Time:   12:30 PM    Blood work Needed:  No     Prep:  nothing to eat or drink 2 hours prior to exam    Patient Informed:  Yes    Date:10/17   Spoke with: MAILED LETTER      If the patient would like to reschedule imaging, they must call Radiology directly at [phone].

## 2016-10-20 ENCOUNTER — Ambulatory Visit: Payer: Self-pay | Admitting: Internal Medicine

## 2016-10-20 ENCOUNTER — Ambulatory Visit: Payer: Self-pay | Admitting: Obstetrics and Gynecology

## 2016-10-20 ENCOUNTER — Encounter: Payer: Self-pay | Admitting: Obstetrics and Gynecology

## 2016-10-20 VITALS — BP 119/73 | HR 75 | Wt 163.0 lb

## 2016-10-20 DIAGNOSIS — Z01419 Encounter for gynecological examination (general) (routine) without abnormal findings: Secondary | ICD-10-CM

## 2016-10-20 DIAGNOSIS — Z30017 Encounter for initial prescription of implantable subdermal contraceptive: Secondary | ICD-10-CM

## 2016-10-20 DIAGNOSIS — Z113 Encounter for screening for infections with a predominantly sexual mode of transmission: Secondary | ICD-10-CM

## 2016-10-20 DIAGNOSIS — N898 Other specified noninflammatory disorders of vagina: Secondary | ICD-10-CM

## 2016-10-20 DIAGNOSIS — Z87891 Personal history of nicotine dependence: Secondary | ICD-10-CM

## 2016-10-20 DIAGNOSIS — Z01812 Encounter for preprocedural laboratory examination: Secondary | ICD-10-CM

## 2016-10-20 LAB — POCT VAGINAL WET MOUNT
CLUE CELLS,POC: NEGATIVE
KOH FOR FUNGUS: NEGATIVE
TRICHOMONAS, POC: NEGATIVE
WHIFF TEST: NEGATIVE
YEAST,POC: NEGATIVE

## 2016-10-20 LAB — POCT URINE PREGNANCY: Lot #: 171151

## 2016-10-20 MED ORDER — ETONOGESTREL 68 MG SC IMPL *I*
68.0000 mg | DRUG_IMPLANT | Freq: Once | SUBCUTANEOUS | Status: AC | PRN
Start: 2016-10-20 — End: 2016-10-20

## 2016-10-20 MED ORDER — NICOTINE POLACRILEX 2 MG MT GUM *I*
2.0000 mg | CHEWING_GUM | OROMUCOSAL | 2 refills | Status: AC | PRN
Start: 2016-10-20 — End: 2016-11-19

## 2016-10-20 MED ORDER — IBUPROFEN 600 MG PO TABS *I*
600.0000 mg | ORAL_TABLET | Freq: Once | ORAL | Status: DC
Start: 2016-10-20 — End: 2016-10-20

## 2016-10-20 NOTE — Procedures (Signed)
Nexplanon insert  Date/Time: 10/20/2016 10:40 AM  Performed by: Tiajuana AmassFLUELLEN, Kyliee Ortego J    Cindy Ochoa, is a 30 y.o. female, G1P1001 with LMP of 10/04/2016 who requests insertion of nexplanon device.    UPT result:  Negative   Consent obtained: written  Consent given by: patient  The procedure risks, benefits, complications and possible alternatives were discussed with the patient.  All questions were answered prior to the patient giving informed consent.      Pre-procedure: The time out was performed immediately prior to procedure, the correct patient, procedure, and site were verified.    The patient was prepped and draped in usual sterile fashion.       Procedure:  The patient's left arm upper arm was prepped with Iodine. The insertion site 6-8 cm proximal to the medial epicondyl was injected with approximately  3 cc of 1% lidocaine. The Nexplanon device was inserted subdermally 1 cm below the crease between the biceps and triceps muscles in the usual manner without difficulty.  The rod was palpated under the skin by provider.  The site was closed with steri-strips and pressure bandaid was applied. Blood loss was minimal.  The patient tolerated the procedure well.      Assessment:  Insertion of Nexplanon contraceptive capsule etonogestrel 68 MG      Plan   Post insertion instructions were reviewed with the patient and written information was also provided.    Instructions included:   Noting that her arm will be achy for about 4 days and she will have mild swelling as well as a bruise as it heals.  She should keep the dressing on for a day to help keep the area clean and dry.  She was warned to do no heavy lifting with that arm for the first 24 hours.   For soreness she can take acetaminophen and/or ibuprofen.   She was advised to call with any issues including fever, severe pain, redness or excessive swelling at insertion site.     The patient will follow up for as needed or for AGY.
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
## 2016-10-20 NOTE — Patient Instructions (Addendum)
After Your Contraceptive Implant Insertion    Congratulations on getting your progesterone contraceptive implant.  You now have one of the most effective forms of birth control, and it will last for up to three years.  Please read the information below about what to expect and about warning signs that need prompt medical attention.    What to Expect   Your arm will be achy for about 4 days.   Your arm will have mild swelling and a bruise as it heals.    What to Do   Keep the dressing on for 1 day   Keep the area clean and dry   Do not do any heavy lifting with that arm for 1 day   Take Acetaminophen(Tylenol) or Ibuprofen(Motrin, Advil) for the soreness.    When to CALL  After your contraceptive implant insertion, call us right away if you have any of the problems listed below.  After business hours and on the weekend, our office number will connect you to our answering service which will put you in touch with the doctor on call, so do not hesitate to call.    Call [phone] if you have any of the following symptoms:   Fever   Excessive swelling at the site   Severe pain in your arm   Bright redness at the site      Birth Control Implant (Nexplanon)                                      [facility]    What is it? A matchstick-sized rod that is inserted into the upper arm to prevent pregnancy, that lasts for up to 3 years at a time.     How does it work?  The rod contains one hormone, a progestin (etonorgestrel). This hormone stops eggs from leaving your ovaries, and makes your cervical mucus thicker. This prevents the sperm from getting to the egg.    How effective is the Nexplanon?  The birth control implant is one of the most effective types of birth control! Less than 1 out of 100 women a year will become pregnant using the implant. It lasts up to three years.     How is the Nexplanon placed?  After taking your history and doing a physical exam, your doctor will use numbing medicine to make the  area on the inside of your arm numb.    The implant is then placed under the skin using a device. This only takes about 5 minutes. You will be able to feel the implant underneath your skin. You should keep a bandage over the area for 1-2 days afterward.    Your doctor must remove the implant after three years. If you would like to stop using the implant, your doctor can remove it at any time. If you would like to continue after 3 years, your doctor can place another implant immediately after taking out the first one.    What are the benefits of Nexplanon?  Women like the implant for several reasons, including:   There is  no medication to remember, and nothing to be placed before intercourse   It is long-lasting--up to 3 years   You can take use it while breastfeeding   You can become pregnant easily once it is removed    How safe is the Nexplanon?  The birth control implant is  very safe. It is so safe that it can be used in women who have had blood clots, heart disease, high blood pressure, obesity and diabetes. But all medications have some risks, so talk to your doctor about your other medical conditions and medications. You should not use Nexplanon if you have breast cancer.     If you are found to be pregnant while using Nexplanon, it will not cause abortion, miscarriage, or birth defects.      Are there any disadvantages to the Nexplanon?  Most women adjust to the Nexplanon without any problems. Irregular bleeding is the main side effect, especially in the first 6-12 months. For most women, their bleeding will become lighter. 1 in 3 women will stop having periods at all after the first 6-12 months. Other women will have irregular bleeding or spotting between periods. Some women may have occasional heavier periods. These side effects are normal.    Serious side effects are rare. Tell your doctor if you have very heavy periods, pain or infection of the insertion site, or if you cant feel the implant.    How  much does it cost?  Your insurance may cover the entire cost of the implant. The Nexplanon can cost $400-800 up front, but lasts for 3 years. There are available programs that provide Nexplanon at NO COST if you do not have insurance coverage. Please ask your clinic about them.      For more information, please visit these websites:    http://www.morales.org/  www.choiceproject.RegulatorBlog.com.cy Asked Questions about Quitting Smoking     Are you or someone you know trying to quit smoking? If so, the following information may help you.     Question: Why should I quit?    Answer: You will live longer and feel better. Quitting will lower your chances of having a heart attack, stroke, or cancer. The people you live with, especially children, will be healthier. If you are pregnant, you will improve your chances of having a healthy baby. And you will have extra money to spend on things other than cigarettes.    Question: What is the first thing I need to do once Ive decided to quit?    Answer: You should set a quit date-the day when you will break free of your tobacco addiction. Then, consider visiting your doctor or other health care provider before the quit date. She or he can help by providing practical advice and information on the medication that is best for you.    Question: What medication would work best for me?    Answer: Different people do better with different methods. You have five choices of medications that are currently approved by the [place] Food and Drug Administration:    A non-nicotine pill (bupropion SR).    Nicotine gum.    A nicotine inhaler.    A nicotine nasal spray.    Nicotine patch.  The gum and patches are available at your local pharmacy, or you can ask your health care provider to write you a prescription for one of the other medications. The good news is that all five medications have been shown to be effective in helping smokers who are motivated to quit.    Question: How will I feel when  I quit smoking? Will I gain weight?    Answer: Many smokers gain weight when they quit, but it is usually less than 10 pounds. Eat a healthy diet, stay active, and try not to let weight gain  distract you from your main goal--quitting smoking. Some of the medications to help you quit may help delay weight gain.    Question: Some of my friends and family are smokers. What should I do when Im with them?    Answer: Tell them that you are quitting, and ask them to assist you in this effort. Specifically, ask them not to smoke or leave cigarettes around you.  Question: What kinds of activities can I do when I feel the urge to smoke?    Answer: Talk with someone, go for a walk, drink water, or get busy with a task. Reduce your stress by taking a hot bath, exercising, or reading a book.    Question: How can I change my daily routine, which includes smoking a cigarette with my breakfast?    Answer: When you first try to quit, change your routine. Eat breakfast in a different place, and drink tea instead of coffee. Take a different route to work.    Question: I like to smoke when I have a drink. Do I have to give up both?  Answer: Its best to avoid drinking alcohol for the first 3 months after quitting because drinking lowers your chances of success at quitting. It helps to drink a lot of water and other nonalcoholic drinks when you are trying to quit.    Question: Donnald Garre tried to quit before and it didnt work. What can I do?    Answer: Remember that most people have to try to quit at least 2 or 3 times before they are successful. Review your past attempts to quit. Think about what worked--and what didnt--and try to use your most successful strategies again.    Question: What should I do if I need more help?    Answer: Get individual, group, or telephone counseling. The more counseling you get, the better your chances are of quitting for good. Programs are given at [employer] and health centers. Call your local health  department for information about programs in your area. Also, talk with your doctor or other health care provider.    To get a free print copy of the consumer brochure, You Can Quit Smoking, call any of the following toll-free numbers:   Agency for [place] and [facility])  [employer] Librarian, academic)  [place] ([place])  800-4-CANCER  More information on quitting is available online at the [employer] site (Desert[employer]be)              Reviewed 10/2011Breast awareness and self-exam         Beginning in their 20s, women should be told about the benefits and limitations of breast self-exam (BSE). Women should be aware of how their breasts normally look and feel and report any new breast changes to a health professional as soon as they are found. Finding a breast change does not necessarily mean there is a cancer.     A woman can notice changes by knowing how her breasts normally look and feel and feeling her breasts for changes (breast awareness), or by choosing to use a step-by-step approach and using a specific schedule to examine her breasts.   Women with breast implants can do BSE. It may be useful to have the surgeon help identify the edges of the implant so that you know what you are feeling. There is some thought that the implants push out the breast tissue  and may make it easier to examine. Women who are pregnant or breast-feeding can also choose to examine their breasts regularly.    If you choose to do BSE, the following information provides a step-by-step approach for the exam. The best time for a woman to examine her breasts is when the breasts are not tender or swollen. Women who examine their breasts should have their technique reviewed during their periodic health exams by their health care professional.     It is acceptable for women to choose not to do BSE or to do BSE occasionally.  Women who choose not to do BSE should still know how their breasts normally look and feel and report any changes to their doctor right away.    How to examine your breasts  Lie down on your back and place your right arm behind your head. The exam is done while lying down, not standing up. This is because when lying down the breast tissue spreads evenly over the chest wall and is as thin as possible, making it much easier to feel all the breast tissue.    Use the finger pads of the 3 middle fingers on your left hand to feel for lumps in the right breast. Use overlapping dime-sized circular motions of the finger pads to feel the breast tissue.    Use 3 different levels of pressure to feel all the breast tissue. Light pressure is needed to feel the tissue closest to the skin; medium pressure to feel a little deeper; and firm pressure to feel the tissue closest to the chest and ribs. It is normal to feel a firm ridge in the lower curve of each breast, but, you should tell your doctor if you feel anything else out of the ordinary. If you're not sure how hard to press, talk with your doctor or nurse. Use each pressure level to feel the breast tissue before moving on to the next spot.           Move around the breast in an up and down pattern starting at an imaginary line drawn straight down your side from the underarm and moving across the breast to the middle of the chest bone (sternum or breastbone). Be sure to check the entire breast area going down until you feel only ribs and up to the neck or collar bone (clavicle).        Move around the breast in an up and down pattern starting at an imaginary line drawn straight down your side from the underarm and moving across the breast to the middle of the chest bone (sternum or breastbone). Be sure to check the entire breast area going down until you feel only ribs and up to the neck or collar bone (clavicle).     There is some evidence to suggest that the up-and-down  pattern (sometimes called the vertical pattern) is the most effective pattern for covering the entire breast without missing any breast tissue.   Repeat the exam on your left breast, putting your left arm behind your head and using the finger pads of your right hand to do the exam.     While standing in front of a mirror with your hands pressing firmly down on your hips, look at your breasts for any changes of size, shape, contour, or dimpling, or redness or scaliness of the nipple or breast skin. (The pressing down on the hips position contracts the chest wall muscles and enhances any breast changes.)  Examine each underarm while sitting up or standing and with your arm only slightly raised so you can easily feel in this area. Raising your arm straight up tightens the tissue in this area and makes it harder to examine.     This procedure for doing breast self-exam is different from previous recommendations. These changes represent an extensive review of the medical literature and input from an expert advisory group. There is evidence that this position (lying down), the area felt, pattern of coverage of the breast, and use of different amounts of pressure increase a woman's ability to find abnormal areas.       Reviewed 09/2010

## 2016-10-21 LAB — CHLAMYDIA PLASMID DNA AMPLIFICATION: Chlamydia Plasmid DNA Amplification: 0

## 2016-10-21 LAB — TRICHOMONAS DNA AMPLIFICATION: Trichomonas DNA amplification: 0

## 2016-10-21 LAB — N. GONORRHOEAE DNA AMPLIFICATION: N. gonorrhoeae DNA Amplification: 0

## 2016-10-26 ENCOUNTER — Ambulatory Visit: Payer: Self-pay | Admitting: Urology

## 2016-10-27 ENCOUNTER — Other Ambulatory Visit: Payer: Self-pay | Admitting: Internal Medicine

## 2016-10-27 ENCOUNTER — Encounter: Payer: Self-pay | Admitting: Surgery

## 2016-10-27 MED ORDER — BISACODYL 10 MG RE SUPP *I*
RECTAL | 3 refills | Status: DC
Start: 2016-10-27 — End: 2017-05-02

## 2016-10-27 MED ORDER — MIRTAZAPINE 7.5 MG PO TABS *A*
7.5000 mg | ORAL_TABLET | Freq: Every evening | ORAL | 1 refills | Status: DC
Start: 2016-10-27 — End: 2017-02-01

## 2016-10-27 NOTE — Telephone Encounter (Signed)
Refilled

## 2016-11-02 ENCOUNTER — Other Ambulatory Visit: Payer: Self-pay | Admitting: Internal Medicine

## 2016-11-03 ENCOUNTER — Encounter: Payer: Self-pay | Admitting: Student in an Organized Health Care Education/Training Program

## 2016-11-03 ENCOUNTER — Other Ambulatory Visit
Admission: RE | Admit: 2016-11-03 | Discharge: 2016-11-03 | Disposition: A | Discharge status: Home / Self Care | Payer: Self-pay | Source: Ambulatory Visit

## 2016-11-03 ENCOUNTER — Ambulatory Visit: Payer: Self-pay | Admitting: Physical Medicine and Rehabilitation

## 2016-11-03 ENCOUNTER — Ambulatory Visit: Payer: Self-pay | Admitting: Student in an Organized Health Care Education/Training Program

## 2016-11-03 VITALS — BP 97/65 | HR 98 | Resp 16 | Ht 65.0 in | Wt 174.0 lb

## 2016-11-03 DIAGNOSIS — Z23 Encounter for immunization: Secondary | ICD-10-CM

## 2016-11-03 DIAGNOSIS — R252 Cramp and spasm: Secondary | ICD-10-CM

## 2016-11-03 DIAGNOSIS — G8253 Quadriplegia, C5-C7 complete: Secondary | ICD-10-CM

## 2016-11-03 DIAGNOSIS — Z113 Encounter for screening for infections with a predominantly sexual mode of transmission: Secondary | ICD-10-CM

## 2016-11-03 DIAGNOSIS — S31000D Unspecified open wound of lower back and pelvis without penetration into retroperitoneum, subsequent encounter: Secondary | ICD-10-CM

## 2016-11-03 DIAGNOSIS — N390 Urinary tract infection, site not specified: Secondary | ICD-10-CM

## 2016-11-03 LAB — MULTIPLE ORDERING DOCS

## 2016-11-03 LAB — HEPATIC FUNCTION PANEL
ALT: 16 U/L (ref 0–35)
AST: 11 U/L (ref 0–35)
Albumin: 4.1 g/dL (ref 3.5–5.2)
Alk Phos: 59 U/L (ref 35–105)
Bilirubin,Direct: <0.2 mg/dL (ref 0.0–0.3)
Bilirubin,Total: <0.2 mg/dL (ref 0.0–1.2)
Total Protein: 7.3 g/dL (ref 6.3–7.7)

## 2016-11-03 LAB — AEROBIC CULTURE

## 2016-11-03 LAB — UNABLE TO VOID

## 2016-11-03 MED ORDER — DANTROLENE SODIUM 25 MG PO CAPS *I*
50.0000 mg | ORAL_CAPSULE | Freq: Four times a day (QID) | ORAL | 2 refills | Status: DC
Start: 2016-11-03 — End: 2017-02-01

## 2016-11-03 MED ORDER — GENERIC DME *A*
0 refills | Status: DC
Start: 2016-11-03 — End: 2020-03-21

## 2016-11-03 MED ORDER — SENNOSIDES-DOCUSATE SODIUM 8.6-50 MG PO TABS *I*
2.0000 | ORAL_TABLET | Freq: Every day | ORAL | 5 refills | Status: DC | PRN
Start: 2016-11-03 — End: 2017-09-17

## 2016-11-03 MED ORDER — HOSPITAL BED MISC *A*
0 refills | Status: DC
Start: 2016-11-03 — End: 2017-11-05

## 2016-11-03 MED ORDER — PSYLLIUM 95 % PO PACK *I*
1.0000 | PACK | Freq: Every day | ORAL | 2 refills | Status: DC
Start: 2016-11-03 — End: 2017-09-17
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
## 2016-11-03 NOTE — Patient Instructions (Addendum)
-   Increase dantrolene to 50 mg four times a day  - Have your blood work done to check your liver levels  - See a physician in the pain clinic to discuss medical marijuana for spasticity  - Follow-up in 1 months

## 2016-11-03 NOTE — Progress Notes (Signed)
[facility]: Progress Note    Chief Complaint: Follow-Up    Subjective:      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      Quadriparesis at C6/Autonomic Dysfunction/Neurogenic bladder and bowel incontience - She has had difficulty with her aide services recently including one aide who left her alone while she was in the shower.  Nurses from wound care had asked what they do to calm her legs.  When she is laying down she notices her legs get very jumpy.  She states that the spasms travel up her legs to her stomach and then her hand shakes.  She has been on baclofen in the past which she states didn't work.  She had been on flexeril previously and we tried it at her last visit which did not work.  She was recently put on dantrolene for this by PMR and dose is being adjusted.  Patient asked me about following liver and kidney function tests on dantrolene.    Constipation - has having some hard BMs on miralax, bisacodyl and percolace.    HCM -  Has been smoking 1 blunt per day [facility]).  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    Medications:     Current Outpatient Prescriptions on File Prior to Visit   Medication Sig Dispense Refill    senna-docusate (PERICOLACE) 8.6-50 MG per tablet Take 2 tablets by mouth daily as needed for Constipation 30 tablet 5    dantrolene (DANTRIUM) 25 MG capsule Take 2 capsules (50 mg total) by mouth 4 times daily 80 capsule 2    generic DME Use as directed. Hospital bed with Low air loss mattress. Duration of use: 1 year. 1 each 0    mirtazapine (REMERON) 7.5 MG tablet Take 1 tablet (7.5 mg total) by mouth nightly 30 tablet 1    bisacodyl (BISAC-EVAC) 10 MG suppository 1-2 as needed every other day 50 suppository 3    nicotine polacrilex (NICORETTE) 2 MG gum Take 1 each (2 mg  total) by mouth every 2 hours as needed for Smoking cessation   Max daily dose: 24 mg 110 tablet 2    Non-System Medication Gel overlay mattress for hospital bed - diagnosis G82.54, L89.93, L89.159 1 each 0    Non-System Medication Hospital Bed with Air Mattress - Diagnosis G82.54, L89.93,L89.159  Medicaid [id number] 1 each 0    disposable underpads 30"x36" (CHUX) Use 6 times daily and PRN. Dx N39.42  Incontinence without sensory awareness 300 each 5    generic DME Left fifth digit finger splint for sprain. 1 each 0    incontinence supply disposable Large pull ups - use up to 5 x daily  Dx N39.42 150 each 5    albuterol HFA 108 (90 BASE) MCG/ACT inhaler Inhale 1-2 puffs into the lungs every 6 hours as needed   Shake well before each use. 1 Inhaler 0    nitrofurantoin monohydrate macrocrystal (MACROBID) 100 MG capsule Take 1 capsule (100 mg total) by mouth 2 times daily 20 capsule 0    Spacer/Aero-Holding Chambers (EASIVENT) spacer Use as instructed 1 each 0    Non-System Medication Medication/Supply: [employer] fast streamer with Sling  Directions for Use: use prn 1 each 0    Non-System Medication Medication/Supply: Replacement Legrest assy parts  for Power Wheelchair  Directions for Use: repair power wheelchair 1 each 0    Non-System Medication Urostomy drainage bags change as needed. Dx N39.42 and  G82.54 10 each 5    disposable gloves 2 boxes Disposable Medium size gloves 200 each 6    generic DME Use as directed. Hoyer lift. Duration of use: lifetime. ICD:10-G82.53 1 each 0    dantrolene (DANTRIUM) 25 MG capsule 1 tablet orally once a day for 3 days then take twice a day for 3 days then take 3 times a day until follow-up. 44 capsule 1    Non-System Medication Leg Bag 1000mg  - diagnosis G82.54 21 each 4    acetaminophen (MAPAP) 500 mg tablet Take 1 tablet (500 mg total) by mouth every 4-6 hours as needed   for pain 180 tablet 5    polyethylene glycol (GLYCOLAX) powder Take 17 g by mouth daily   Mix in 8 oz water  or juice and drink. 255 g 1    disposable gloves 2 boxes Disposable Medium size gloves 200 each 6    Non-System Medication Shower Chair - Quadriparesis G82.54 1 each 0    Ostomy Supplies (HOLLISTER REPLACEMENT FILTERS) MISC Hollister Adapter (order # [id number]). 1 each 1    ARTIFICIAL TEARS 1.4 % ophthalmic solution PLACE 1 DROP INTO BOTH EYES FOUR TIMES DAILY 15 mL 10    Non-System Medication PRI AND SCREEN  DIAGNOSIS 344.04 Quadriparesis 1 each 0    white petrolatum-mineral oil (GENTEAL PM) 85-15 % ophthalmic ointment Place into both eyes nightly 3.5 mL 6    Skin Protectants, Misc. OINT Apply 1 Tube topically 2 times daily   1 Tube 11    Non-System Medication One Hoyer Lift  Diagnosis:  Quadriplegic ICD-9 344.04 1 each 0     No current facility-administered medications on file prior to visit.        Medications reviewed and reconciled.   Allergies:     Allergies   Allergen Reactions    Nitrofurantoin Nausea And Vomiting    Vancomycin Hives     hives 2006 but tolerated Rx in 2010  pt states she had vancomycin in 04/2012.  Feels this is not true allergy  as she has received it recently - had no reaction     Heparin Other (See Comments)     Thrombocytopenia;        Review of Systems:     Pertinent positives and negatives as per HPI.     Physical Exam:     There were no vitals filed for this visit.  Wt Readings from Last 3 Encounters:   11/03/16 78.9 kg (174 lb)   10/20/16 73.9 kg (163 lb)   08/06/16 73.5 kg (162 lb)     There is no height or weight on file to calculate BMI.  BP Readings from Last 3 Encounters:   11/03/16 97/65   10/20/16 119/73   08/06/16 100/66       General: Well-appearing, NAD, AOx3  HEENT: NCAT  Lymphatics: No LAD  Pulmonary: CTA b/l, Normal WOB  Cardiovascular: RRR, normal S1/S2, no M/R/G  Abdominal: soft, nt, nd, +BS  Extremities: wwp, no edema  Skin: No rashes or lesions    Assessment and Plan:     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    Quadriparesis at C6/Autonomic Dysfunction/Neurogenic bladder and bowel incontinence    -  continue aide services  - will order hospital bed for patient    Constipation     - will add metamcuil, continue miralax, percolace  - suppositories as needed  - continue regular diet    Leg Spasms    - dantorlene per PMR, requested patient ask them about follow up testing for kidney and liver function tests  - no longer taking flexeril or baclofen  - PMR considering medical marijuana    HCM:    - Influenza vaccine given today    RTC in 4 months    [name], MD   Internal Medicine, PGY-3

## 2016-11-04 ENCOUNTER — Ambulatory Visit: Payer: Self-pay | Admitting: Surgery

## 2016-11-04 ENCOUNTER — Telehealth: Payer: Self-pay

## 2016-11-04 VITALS — BP 117/64 | HR 101 | Temp 97.6°F | Resp 22 | Ht 65.0 in | Wt 167.0 lb

## 2016-11-04 DIAGNOSIS — L89153 Pressure ulcer of sacral region, stage 3: Secondary | ICD-10-CM

## 2016-11-04 DIAGNOSIS — G909 Disorder of the autonomic nervous system, unspecified: Secondary | ICD-10-CM

## 2016-11-04 DIAGNOSIS — L89312 Pressure ulcer of right buttock, stage 2: Secondary | ICD-10-CM

## 2016-11-04 LAB — HEPATITIS B SURFACE ANTIGEN: HBV S Ag: NEGATIVE

## 2016-11-04 LAB — SYPHILIS SCREEN
Syphilis Screen: NEGATIVE
Syphilis Status: NONREACTIVE

## 2016-11-04 LAB — HEPATITIS C ANTIBODY: Hep C Ab: NEGATIVE

## 2016-11-04 LAB — HIV 1&2 ANTIGEN/ANTIBODY: HIV 1&2 ANTIGEN/ANTIBODY: NONREACTIVE

## 2016-11-04 MED ORDER — LIDOCAINE HCL 4 % EX SOLN *I*
CUTANEOUS | Status: AC | PRN
Start: 2016-11-04 — End: 2017-01-03

## 2016-11-04 NOTE — Telephone Encounter (Signed)
Patient mother is calling again to speak with a provider concerning her daughter please call [name] back at [phone]

## 2016-11-04 NOTE — Telephone Encounter (Signed)
Spoke with [name], Rajvi's mother and primary caregiver, stated that she would like a second opinion with a different provider. I stated we could accommodate and set up an appointment with another nurse practitioner in a month. We agreed to plan.

## 2016-11-04 NOTE — Patient Instructions (Addendum)
Please follow these instructions about how to care for your wound.          Wound Location Dressing Orders Frequency of Dressing Change   Location: Sacral                  Location: Right ischium Calmoseptine to peri wound  Aquacel  ABD pad  Tape      Vitamin a+d to dry areas or vaseline     [x]  daily     Cleanse Wound(s) with:    [x]  Soap & Water with dressing changes, keep dressing dry in between       [x]  Leg Elevation: Elevate leg(s) to level of heart   or above for 30 minutes 3 times per day.  [x]  Right   Leg [x]  Left   Leg       [x]  Assistive Device(s): [x]  Wheelchair    [x]  Pressure Reduction: [x]  Wheelchair Cushion   [x]  Specialty Bed [x]  Reposition every 2 hours   [x]  Limit time up in your chair to 2 hours or less at a time      [x]  General: [x]  Stop / Decrease Smoking                 []   Discharge from [facility] Discharge Instructions    Following the instructions below will give you the best opportunity for wound healing.    Wound Care Instructions (If changing own dressing):   Gather all supplies you will need to change your dressing.   Before changing your dressing, wash your hands for at least 15 seconds with warm soapy water.   Rinse off all soap, then dry with a towel.   Remove the old dressing.  Wash hands again before applying the new dressing.    If you experience any of the following during our business hours of 8 AM - 4:30 PM, Monday - Friday, please call the [facility] at [phone]:    ? Increase in pain  ?Temperature over 101F   ?Drainage with a foul odor   ?Bleeding   ? Increase in swelling  ? Increase in drainage from your wound  ? Need for compression bandage changes (slippage, breakthrough drainage)    Please contact your primary care physician or proceed to the nearest emergency room if you experience any of the above after our business hours.     Please note your appointment(s) above - if you are unable to keep, kindly  give 24 hours' notice.  Thank You!

## 2016-11-04 NOTE — Telephone Encounter (Signed)
Patients mother [name] called stating that her daughter no longer wants to see the provider that she has been currently seeing. Please advise. [name] can be reached at [phone].
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
## 2016-11-05 ENCOUNTER — Ambulatory Visit: Payer: Self-pay | Admitting: Student in an Organized Health Care Education/Training Program

## 2016-11-05 ENCOUNTER — Other Ambulatory Visit
Admission: RE | Admit: 2016-11-05 | Discharge: 2016-11-05 | Disposition: A | Discharge status: Home / Self Care | Payer: Self-pay | Source: Ambulatory Visit | Attending: Urology | Admitting: Urology

## 2016-11-05 ENCOUNTER — Ambulatory Visit
Admission: RE | Admit: 2016-11-05 | Discharge: 2016-11-05 | Disposition: A | Discharge status: Home / Self Care | Payer: Self-pay | Source: Ambulatory Visit | Attending: Urology | Admitting: Urology

## 2016-11-05 DIAGNOSIS — N390 Urinary tract infection, site not specified: Secondary | ICD-10-CM

## 2016-11-05 MED ORDER — IOHEXOL 350 MG/ML (OMNIPAQUE) IV SOLN *I*
1.0000 mL | Freq: Once | INTRAVENOUS | Status: AC
Start: 2016-11-05 — End: 2016-11-05
  Administered 2016-11-05: 113 mL via INTRAVENOUS

## 2016-11-06 ENCOUNTER — Telehealth: Payer: Self-pay

## 2016-11-06 ENCOUNTER — Telehealth: Payer: Self-pay | Admitting: Surgery

## 2016-11-06 NOTE — Telephone Encounter (Signed)
Cindy Ochoa please call patient back as she is returning your phone call.

## 2016-11-06 NOTE — Telephone Encounter (Signed)
Spoke to [place] today regarding results of the CT scan concerning for the sacral wound. I spoke earlier today with the radiologist and he was not finding any bone involvement that would suggest osteomyelitis. There was some gas or air seen within the wound near the sacrum. Cindy Ochoa reports no chills, fevers. Plan will be to check labs. I ordered CBC/diff; CRP; CMP to evaluate for infection. She will get these done within 1 week. NO antibiotics indicated at this time. She verbalized understanding and was agreeable to the plan of care

## 2016-11-06 NOTE — Telephone Encounter (Signed)
Lattie Haw, NP did speak with [place] regarding lab work.

## 2016-11-08 LAB — AEROBIC CULTURE

## 2016-11-09 ENCOUNTER — Other Ambulatory Visit: Payer: Self-pay | Admitting: Urology

## 2016-11-09 ENCOUNTER — Telehealth: Payer: Self-pay | Admitting: Urology

## 2016-11-09 DIAGNOSIS — R300 Dysuria: Secondary | ICD-10-CM

## 2016-11-09 NOTE — Telephone Encounter (Signed)
Patient requesting culture results.        Aerobic culture   Order: [id number]   Status:  Final result Visible to patient:  Yes [facility] MyChart) Dx:  Recurrent UTI      4d ago     Aerobic Culture Klebsiella pneumoniae (!)   Comments: >100,000/ml   Aerobic Culture Escherichia coli (!)   Comments: >100,000/ml   Resulting Agency [place]   Susceptibility    Klebsiella pneumoniae Escherichia coli     MIC MIC     Amikacin Sensitive Sensitive     Ampicillin Resistant Sensitive     Aztreonam Sensitive Sensitive     Cefazolin Sensitive Sensitive     Cefepime Sensitive Sensitive     Ceftriaxone Sensitive Sensitive     Ciprofloxacin Sensitive Resistant     Ertapenem Sensitive Sensitive     ESBL - -     Gentamicin Sensitive Sensitive     Meropenem Sensitive Sensitive     Nitrofurantoin Sensitive Sensitive     Piperacillin/Tazobactam Sensitive Sensitive     Tobramycin Sensitive Sensitive     Trimethoprim/Sulfa Resistant Resistant

## 2016-11-09 NOTE — Telephone Encounter (Signed)
Called patient, left VM.    Patient should obtain labwork. Patient has urinary diversion.

## 2016-11-09 NOTE — Telephone Encounter (Signed)
Cindy Ochoa is requesting a return call to review her recent test results.    What type of test was done? UA    On what day was the test done? 11/9    Please return the patients call at [phone].

## 2016-11-10 ENCOUNTER — Other Ambulatory Visit
Admission: RE | Admit: 2016-11-10 | Discharge: 2016-11-10 | Disposition: A | Discharge status: Home / Self Care | Payer: Self-pay | Source: Ambulatory Visit | Attending: Urology | Admitting: Urology

## 2016-11-10 ENCOUNTER — Telehealth: Payer: Self-pay | Admitting: Internal Medicine

## 2016-11-10 ENCOUNTER — Telehealth: Payer: Self-pay | Admitting: Physical Medicine and Rehabilitation

## 2016-11-10 DIAGNOSIS — R300 Dysuria: Secondary | ICD-10-CM

## 2016-11-10 DIAGNOSIS — N390 Urinary tract infection, site not specified: Secondary | ICD-10-CM

## 2016-11-10 LAB — CBC AND DIFFERENTIAL
Baso # K/uL: 0 10*3/uL (ref 0.0–0.1)
Basophil %: 0.3 %
Eos # K/uL: 0.1 10*3/uL (ref 0.0–0.4)
Eosinophil %: 1.1 %
Hematocrit: 42 % (ref 34–45)
Hemoglobin: 13.4 g/dL (ref 11.2–15.7)
IMM Granulocytes #: 0 10*3/uL (ref 0.0–0.1)
IMM Granulocytes: 0.2 %
Lymph # K/uL: 2.5 10*3/uL (ref 1.2–3.7)
Lymphocyte %: 41.2 %
MCH: 29 pg/cell (ref 26–32)
MCHC: 32 g/dL (ref 32–36)
MCV: 93 fL (ref 79–95)
Mono # K/uL: 0.3 10*3/uL (ref 0.2–0.9)
Monocyte %: 5.5 %
Neut # K/uL: 3.2 10*3/uL (ref 1.6–6.1)
Nucl RBC # K/uL: 0 10*3/uL (ref 0.0–0.0)
Nucl RBC %: 0 /100 WBC (ref 0.0–0.2)
Platelets: 106 10*3/uL — ABNORMAL LOW (ref 160–370)
RBC: 4.6 MIL/uL (ref 3.9–5.2)
RDW: 16.5 % — ABNORMAL HIGH (ref 11.7–14.4)
Seg Neut %: 51.7 %
WBC: 6.2 10*3/uL (ref 4.0–10.0)

## 2016-11-10 LAB — COMPREHENSIVE METABOLIC PANEL
ALT: 11 U/L (ref 0–35)
AST: 10 U/L (ref 0–35)
Albumin: 4.5 g/dL (ref 3.5–5.2)
Alk Phos: 61 U/L (ref 35–105)
Anion Gap: 17 — ABNORMAL HIGH (ref 7–16)
Bilirubin,Total: 0.3 mg/dL (ref 0.0–1.2)
CO2: 22 mmol/L (ref 20–28)
Calcium: 9.8 mg/dL (ref 8.8–10.2)
Chloride: 105 mmol/L (ref 96–108)
Creatinine: 0.52 mg/dL (ref 0.51–0.95)
GFR,Black: 147 *
GFR,Caucasian: 127 *
Glucose: 85 mg/dL (ref 60–99)
Lab: 13 mg/dL (ref 6–20)
Potassium: 4 mmol/L (ref 3.3–5.1)
Sodium: 144 mmol/L (ref 133–145)
Total Protein: 8 g/dL — ABNORMAL HIGH (ref 6.3–7.7)

## 2016-11-10 LAB — CRP: CRP: 9 mg/L (ref 0–10)

## 2016-11-10 LAB — AEROBIC CULTURE

## 2016-11-10 NOTE — Telephone Encounter (Signed)
Cindy Ochoa patients mother calling to request script for a hospital bed to be sent to a medical supply stores  Cindy Ochoa stated she doesn't know who to call or where to go.    Cindy Ochoa can be reached at [phone]

## 2016-11-10 NOTE — Telephone Encounter (Signed)
The message below was sent to the patient via MyChart.

## 2016-11-10 NOTE — Telephone Encounter (Signed)
Pt has a script for a hospital bed and generic DME and she doesn't know who to call or where to go.  She needs help finding this.   Please call her.

## 2016-11-11 ENCOUNTER — Telehealth: Payer: Self-pay

## 2016-11-11 ENCOUNTER — Telehealth: Payer: Self-pay | Admitting: Surgery

## 2016-11-11 NOTE — Telephone Encounter (Signed)
Dr Modesta Messing - could you go in and amend encounter so that her hospital bed would be approved?  Per [name] vendor that would supply hospital bed - need to document in note not only that she needs hospital bed but the following:    Exiting bed old and broken  Member is wheelchair bound and has documented history of pressure ulcers which requires positioning of the body in ways not feasible with an ordinary bed in order to alleviate     Also state she needs gel overlay to alleviate bed sores/ulcers

## 2016-11-11 NOTE — Telephone Encounter (Signed)
Hilda Blades, patient's mother, called in regards to request below     Caller states that the hospital bed would be more beneficial because patient has been receiving bed sores for over a year with current mattress   Caller would like this prescribed and sent to a [place] can be reached at [phone] to advise when completed

## 2016-11-11 NOTE — Telephone Encounter (Signed)
I reached out to [facility] to make sure that they can assist with this etc.  I left a message for the patient to call me. 11/11/2016.

## 2016-11-11 NOTE — Telephone Encounter (Signed)
Called patient and left message.  I was calling to check if she still has Medicaid or if this has changed.  Her appointment right now is with Derrek Monaco NP, who can not bill Medicaid.  If she is not having symptoms I was going to move her appointment to 12/01/16 with Dr. [name].

## 2016-11-11 NOTE — Telephone Encounter (Signed)
Call placed to patient - no answer. Message left on voicemail to return call to wound clininc at [phone] regarding her recent lab results

## 2016-11-11 NOTE — Progress Notes (Addendum)
Addendum: 10:46 AM 11/11/16     - Patient has had a hospital bed for many years and it is currently broken.  Patient requires hospital bed as she is wheelchair bound, has history of pressure ulcers which require body positioning not feasible with an ordinary bed to alleviate pain.  Ulcer also require gel overlays.    Cindy Speller, MD

## 2016-11-12 ENCOUNTER — Other Ambulatory Visit: Payer: Self-pay

## 2016-11-12 NOTE — Telephone Encounter (Signed)
I spoke to the patient. She is all set with equipment order.

## 2016-11-16 ENCOUNTER — Other Ambulatory Visit
Admission: RE | Admit: 2016-11-16 | Discharge: 2016-11-16 | Disposition: A | Discharge status: Home / Self Care | Payer: Self-pay | Source: Ambulatory Visit | Attending: Urology | Admitting: Urology

## 2016-11-16 ENCOUNTER — Ambulatory Visit: Payer: Self-pay | Admitting: Urology

## 2016-11-25 ENCOUNTER — Encounter: Payer: Self-pay | Admitting: Physical Medicine and Rehabilitation

## 2016-11-25 NOTE — Telephone Encounter (Signed)
Cindy Ochoa from [place] is calling to follow up on requested chart notes from 11/7. She states upon document review, they discovered that the doctor is recommending current hospital bed be fixed, not that she receive a new one. She is wondering if she is truly seeking new one. She states if Medicaid sees this in the chart notes, they won't likely approve it.    Cindy Ochoa can be reached back at [phone]     If needed, her fax number is [phone].

## 2016-11-25 NOTE — Telephone Encounter (Signed)
I contacted [name] and told her note was refaxed after being amended by Dr. Hassell Done.  Asking her to please call me direclty with any questions.  The patient has been waiting for this bed since September.

## 2016-11-30 ENCOUNTER — Telehealth: Payer: Self-pay | Admitting: Internal Medicine

## 2016-11-30 NOTE — Telephone Encounter (Signed)
[name] from [name] is calling because she states that the office notes that were sent over for the patient to receive a new semi electric bed and gel overlay recommended that the patient's current bed get fixed not that she receives a new one. [name] has faxed over the guidelines for the patient to receive a new bed to assist doctor with a new office note.     [name] can be reached at [phone].

## 2016-12-02 NOTE — Telephone Encounter (Signed)
[name] is reaching out to Mrs. Sword asking about money set aside for equipment (from trust)

## 2016-12-03 NOTE — Telephone Encounter (Signed)
Patient is requesting a call back from [place]  Patient can be reached at [phone]

## 2016-12-04 ENCOUNTER — Encounter: Payer: Self-pay | Admitting: Surgery

## 2016-12-07 ENCOUNTER — Telehealth: Payer: Self-pay

## 2016-12-07 ENCOUNTER — Other Ambulatory Visit: Payer: Self-pay | Admitting: Internal Medicine

## 2016-12-07 DIAGNOSIS — R102 Pelvic and perineal pain: Secondary | ICD-10-CM

## 2016-12-07 MED ORDER — IBUPROFEN 600 MG PO TABS *I*
600.0000 mg | ORAL_TABLET | Freq: Four times a day (QID) | ORAL | 1 refills | Status: DC | PRN
Start: 2016-12-07 — End: 2017-02-17

## 2016-12-07 NOTE — Telephone Encounter (Signed)
[facility] with complaints of less flow than a normal period. She states she has had some form of vaginal bleeding for the las three weeks. She states it fluctuates from spotting to a light flow.   She also states she had been experiencing headaches when she has this bleeding. She normally takes ibuprofen but she has run out and is requesting a prescription. Pharmacy and allergies verified.    Current method of contraception:  nEXPLANON.    LARC was placed on 10/23/2016.    Patient denies  dizziness    She denies pain.  She rates her pain at a level of Pain Score: 0/10      Writer reviewed with patient that irregular vaginal bleeding is common after IUD or Nexplanon placement.     This irregular bleeding can continue for up to one year. This is due to the body having to adjust to the hormones that are in the LARC.    Reviewed bleeding precautions with patient. And she was instructed to monitor and call this office for worsening symptoms.      Patient: Demonstrated good understanding of concepts   Message routed to CCP, [facility]

## 2016-12-07 NOTE — Telephone Encounter (Signed)
Rx for ibuprofen 600 mg p.o. Q 6 hours prn. Has nexplanon in place and will monitor bleeding.

## 2016-12-07 NOTE — Telephone Encounter (Signed)
Refilled

## 2016-12-07 NOTE — Telephone Encounter (Signed)
Patient is calling preferring to speak with Peter Congo if possible. Patient has the nexplanon birth control and has been having abdominal cramping as well as headaches. Patient would like to know if it is normal for her to have bleeding for so long.

## 2016-12-08 NOTE — Telephone Encounter (Signed)
Returned call to patient left voice mail.  Also contacted [name] for status and left voice mail for her also.

## 2016-12-10 ENCOUNTER — Other Ambulatory Visit: Payer: Self-pay | Admitting: Internal Medicine

## 2016-12-10 DIAGNOSIS — G8254 Quadriplegia, C5-C7 incomplete: Secondary | ICD-10-CM

## 2016-12-10 DIAGNOSIS — N319 Neuromuscular dysfunction of bladder, unspecified: Secondary | ICD-10-CM

## 2016-12-10 MED ORDER — NON-SYSTEM MEDICATION *A*
4 refills | Status: DC
Start: 2016-12-10 — End: 2017-05-25

## 2016-12-10 NOTE — Telephone Encounter (Signed)
Fax to [facility]

## 2016-12-11 ENCOUNTER — Encounter: Payer: Self-pay | Admitting: Surgery

## 2016-12-14 ENCOUNTER — Telehealth: Payer: Self-pay

## 2016-12-14 NOTE — Telephone Encounter (Signed)
Per pt request, to schedule appt w/Dr. Nash Mantis, left voice msg to call back, sent letter to pt

## 2016-12-14 NOTE — Telephone Encounter (Signed)
faxed

## 2016-12-15 ENCOUNTER — Telehealth: Payer: Self-pay | Admitting: Physical Medicine and Rehabilitation

## 2016-12-15 NOTE — Telephone Encounter (Signed)
Patient would like to be sooner than the next available in May, is asking if she can be seen with resident sooner. Please call back at [phone]

## 2016-12-18 ENCOUNTER — Other Ambulatory Visit: Payer: Self-pay | Admitting: Urology

## 2016-12-18 ENCOUNTER — Telehealth: Payer: Self-pay | Admitting: Urology

## 2016-12-18 ENCOUNTER — Encounter: Payer: Self-pay | Admitting: Gastroenterology

## 2016-12-18 DIAGNOSIS — N390 Urinary tract infection, site not specified: Secondary | ICD-10-CM

## 2016-12-18 MED ORDER — CEPHALEXIN 500 MG PO CAPS *I*
500.0000 mg | ORAL_CAPSULE | Freq: Three times a day (TID) | ORAL | 0 refills | Status: AC
Start: 2016-12-18 — End: 2016-12-25

## 2016-12-18 NOTE — Telephone Encounter (Signed)
Ms. Waser is returning a call from [facility]. She states this is regarding a request to have a urine sample ordered. She is requesting a call back at [phone]

## 2016-12-18 NOTE — Telephone Encounter (Signed)
See other telephone encounter for today. Thanks Zakari Bathe.

## 2016-12-18 NOTE — Telephone Encounter (Signed)
Left message on machine for pt to call the office back

## 2016-12-18 NOTE — Telephone Encounter (Signed)
Cindy Ochoa calling to report that she is experiencing urinary frequency, chills, and stated her urine has a odor. These symptoms have been going on for two days. She stated that she is a quadriplegic she is not able to give a pain rating.        Patient requesting call back? yes    Phone number confirmed at [phone], if no answer please leave voicemail

## 2016-12-18 NOTE — Telephone Encounter (Signed)
[name] called, she would to inform nurse that there is an "emergent need" for this to be taken care of and is sure her daughter has a UTI.    1. Is pt having pain currently on scale 1-10/10? Patient states she is a quadriplegic and cannot feel pain.  2. Location of pain (flank/back, abdomen, right or left)? Patient states she is a quadriplegic and cannot feel pain.  3. Character of pain (sharp, dull, intermittent, constant)? Patient states she is a quadriplegic and cannot feel pain.  4. Urgency/Frequency/Burning/Hematuria? No.  5. Fever or chills? Chills  6. Nausea/vomiting? No.  7. Any constipation? Yes.  8. How much water is patient drinking per day? Unsure  9. Does he have any fluid restrictions? No.  10. Is he able to give a urine sample to any strong lab (order placed)? Yes.    Please call [facility] back at: [phone]

## 2016-12-18 NOTE — Telephone Encounter (Signed)
Called patient, spoke to Somis. Patient is having chills, sweats, and increased spasms. No reports of fever, but patient does not have a thermometer to check. Of note, she has also has skin breakdown as well as constipation. Per mother, skin breakdown has improved. Patient was disimpacted this morning and chills/sweats did not resolve. She is submitting a urine sample today from a clean urostomy bag.    Prescribed keflex for patient to have on hand if symptoms do not improve over the long weekend. If patient develops a fever, she was instructed to go to the ED. No further questions.

## 2016-12-18 NOTE — Telephone Encounter (Signed)
Called and left general message, due to hippa, asked to call back. Please ask patient following questions. Thanks [employer].      1. Is pt having pain currently on scale 1-10/10?  2. Location of pain (flank/back, abdomen, right or left)?  3. Character of pain (sharp, dull, intermittent, constant)?  4. Urgency/Frequency/Burning/Hematuria?  5. Fever or chills?  6. Nausea/vomiting?  7. Any constipation?  8. How much water is patient drinking per day?  9. Does he have any fluid restrictions?  10. Is he able to give a urine sample to any strong lab (order placed)?

## 2016-12-18 NOTE — Telephone Encounter (Signed)
Per pt left message on machine for pt to call the office back. Leonides Cave, LPN

## 2016-12-18 NOTE — Telephone Encounter (Signed)
[facility] Ochoa is returning a call from [place]. She states this is regarding urine sample. She is requesting a call back at [phone].

## 2016-12-22 ENCOUNTER — Encounter: Payer: Self-pay | Admitting: Surgery

## 2016-12-24 ENCOUNTER — Ambulatory Visit: Payer: Self-pay | Admitting: Urology

## 2016-12-31 ENCOUNTER — Other Ambulatory Visit
Admission: RE | Admit: 2016-12-31 | Discharge: 2016-12-31 | Disposition: A | Discharge status: Home / Self Care | Payer: Self-pay | Source: Ambulatory Visit | Attending: Urology | Admitting: Urology

## 2016-12-31 ENCOUNTER — Other Ambulatory Visit: Payer: Self-pay

## 2016-12-31 DIAGNOSIS — N390 Urinary tract infection, site not specified: Secondary | ICD-10-CM

## 2016-12-31 LAB — URINALYSIS WITH MICROSCOPIC
Ketones, UA: NEGATIVE
Nitrite,UA: POSITIVE — AB
Protein,UA: 30 mg/dL — AB
RBC,UA: 10 /hpf — AB (ref 0–2)
Specific Gravity,UA: 1.013 (ref 1.002–1.030)
WBC,UA: 61 /hpf — AB (ref 0–5)
pH,UA: 6 (ref 5.0–8.0)

## 2017-01-01 LAB — AEROBIC CULTURE: Aerobic Culture: 0

## 2017-01-04 ENCOUNTER — Encounter: Payer: Self-pay | Admitting: Urology

## 2017-01-04 ENCOUNTER — Ambulatory Visit: Payer: Self-pay | Admitting: Urology

## 2017-01-04 VITALS — BP 101/66 | HR 84 | Temp 97.2°F | Ht 65.0 in | Wt 167.0 lb

## 2017-01-04 DIAGNOSIS — N319 Neuromuscular dysfunction of bladder, unspecified: Secondary | ICD-10-CM

## 2017-01-04 DIAGNOSIS — N390 Urinary tract infection, site not specified: Secondary | ICD-10-CM

## 2017-01-04 LAB — AEROBIC CULTURE

## 2017-01-04 MED ORDER — OXYBUTYNIN CHLORIDE 10 MG PO TB24 *I*
10.0000 mg | ORAL_TABLET | Freq: Every day | ORAL | 11 refills | Status: DC
Start: 2017-01-04 — End: 2017-01-25
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
## 2017-01-07 ENCOUNTER — Ambulatory Visit: Payer: Self-pay | Admitting: Surgery

## 2017-01-07 ENCOUNTER — Encounter: Payer: Self-pay | Admitting: Surgery

## 2017-01-07 VITALS — BP 131/81 | HR 82 | Temp 97.9°F | Resp 18 | Ht 65.0 in | Wt 163.0 lb

## 2017-01-07 DIAGNOSIS — G909 Disorder of the autonomic nervous system, unspecified: Secondary | ICD-10-CM

## 2017-01-07 DIAGNOSIS — L89153 Pressure ulcer of sacral region, stage 3: Secondary | ICD-10-CM

## 2017-01-07 DIAGNOSIS — L89312 Pressure ulcer of right buttock, stage 2: Secondary | ICD-10-CM

## 2017-01-07 MED ORDER — LIDOCAINE HCL 4 % EX SOLN *I*
CUTANEOUS | Status: AC | PRN
Start: 2017-01-07 — End: 2017-03-08

## 2017-01-07 NOTE — Patient Instructions (Addendum)
Please follow these instructions about how to care for your wound.            Wound Location Dressing Orders Frequency of Dressing Change   Location: Sacral                  Location: Right ischium Calmoseptine to peri wound  Aquacel  ABD pad  Tape      Vitamin a+d to dry areas or vaseline      Aquacel, ABD, secure with tape [x]  daily     Cleanse Wound(s) with:    [x] Soap &Water with dressing changes, keep dressing dry in between       [x] Leg Elevation: Elevate leg(s) to level of heart   or above for 30 minutes 3 times per day.  [x] Right   Leg [x] Left   Leg       [x] Assistive Device(s): [x] Wheelchair    [x] Pressure Reduction: [x] Wheelchair Cushion   [x] Specialty Bed [x] Reposition every 2 hours   [x] Limit time up in your chair to 2 hours or less at a time      [x] General: [x] Stop / Decrease Smoking         []   Discharge from [facility] Discharge Instructions    Following the instructions below will give you the best opportunity for wound healing.    Wound Care Instructions (If changing own dressing):   Gather all supplies you will need to change your dressing.   Before changing your dressing, wash your hands for at least 15 seconds with warm soapy water.   Rinse off all soap, then dry with a towel.   Remove the old dressing.  Wash hands again before applying the new dressing.    If you experience any of the following during our business hours of 8 AM - 4:30 PM, Monday - Friday, please call the [facility] at [phone]:    ? Increase in pain  ?Temperature over 101F   ?Drainage with a foul odor   ?Bleeding   ? Increase in swelling  ? Increase in drainage from your wound  ? Need for compression bandage changes (slippage, breakthrough drainage)    Please contact your primary care physician or proceed to the nearest emergency room if you experience any of the above after our business hours.     Please note your appointment(s) above - if  you are unable to keep, kindly give 24 hours' notice.  Thank You!
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
## 2017-01-12 ENCOUNTER — Telehealth: Payer: Self-pay | Admitting: Urology

## 2017-01-12 NOTE — Telephone Encounter (Signed)
Called [facility], they are requesting PA.

## 2017-01-12 NOTE — Telephone Encounter (Signed)
Patient is calling regarding oxybutynin (DITROPAN-XL) 10 MG 24 hr tablet. States she is being told by [place] that [facility] is not authorized to write her script. Patient believes this is a issue because she has full Medicaid. Patient can be reached at [phone].

## 2017-01-13 ENCOUNTER — Encounter: Payer: Self-pay | Admitting: Gastroenterology

## 2017-01-14 ENCOUNTER — Ambulatory Visit: Payer: Self-pay | Admitting: Internal Medicine

## 2017-01-18 ENCOUNTER — Other Ambulatory Visit: Payer: Self-pay | Admitting: Internal Medicine

## 2017-01-18 DIAGNOSIS — G8254 Quadriplegia, C5-C7 incomplete: Secondary | ICD-10-CM

## 2017-01-18 DIAGNOSIS — G8250 Quadriplegia, unspecified: Secondary | ICD-10-CM

## 2017-01-18 DIAGNOSIS — N319 Neuromuscular dysfunction of bladder, unspecified: Secondary | ICD-10-CM

## 2017-01-19 ENCOUNTER — Emergency Department
Admission: EM | Admit: 2017-01-19 | Disposition: A | Discharge status: Home / Self Care | Payer: Self-pay | Source: Ambulatory Visit | Attending: Emergency Medicine | Admitting: Emergency Medicine

## 2017-01-19 ENCOUNTER — Encounter: Payer: Self-pay | Admitting: Internal Medicine

## 2017-01-19 MED ORDER — DIAZEPAM 2 MG PO TABS *I*
2.0000 mg | ORAL_TABLET | Freq: Three times a day (TID) | ORAL | 0 refills | Status: DC | PRN
Start: 2017-01-19 — End: 2017-02-01

## 2017-01-19 MED ORDER — DISPOSABLE GLOVES MISC *A*
6 refills | Status: DC
Start: 2017-01-19 — End: 2017-11-05

## 2017-01-19 MED ORDER — DANTROLENE SODIUM 50 MG PO CAPS *I*
50.0000 mg | ORAL_CAPSULE | Freq: Four times a day (QID) | ORAL | 0 refills | Status: DC
Start: 2017-01-19 — End: 2017-02-17

## 2017-01-19 MED ORDER — DISPOSABLE UNDERPADS 30"X36" MISC *A*
5 refills | Status: DC
Start: 2017-01-19 — End: 2017-11-05

## 2017-01-19 MED ORDER — INCONTINENCE SUPPLY DISPOSABLE MISC *A*
5 refills | Status: DC
Start: 2017-01-19 — End: 2017-05-25

## 2017-01-19 MED ORDER — NON-SYSTEM MEDICATION *A*
5 refills | Status: DC
Start: 2017-01-19 — End: 2017-05-25

## 2017-01-19 NOTE — Comprehensive Assessment (Signed)
Patient DOB:  [id number]    Patient Address:  [address]  [place] [address]    Patient Phone:  [phone] (home) [phone] (work)    Patient Insurance:  [place],    MCD,    [place],    S          Patient PCP:  [name], MD    Patient Appointment Calendar  [email or web address] @    Intervention Status:  DSRIP Intervention status: (P) Initial intervention  DSRIP Patient?: (P) Yes  Patient address confirmation: (P) Yes  Patient phone confirmation: (P) Yes  Patient insurance confirmation: (P) Yes  Patient FCM referred?: (P) No  Appt type: (P) Follow up appt-PCP  Appointment center/address:: (P) [facility] ([facility]), [place]  Patient followup provider: (P) Lavone Orn   DSRIP Patient followup appointment date: (P) 02/01/17  Patient followup appointment time: (P) 1340  Patient transport plan: (P) self  FCM Patient application complete?: (P) No  PAM completed?: (P) No  Time Spent with Patient (min): (P) 10    Patient has a follow up appointment in place with PCP. No further CHW assistance is needed at this time.    Clovis Cao   [employer]  [phone]
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
## 2017-01-19 NOTE — Progress Notes (Signed)
SW contacted Medicaid to arrange stretcher transportation for the Patient's discharge to home. Medicaid contacted multiple transportation vendors in an attempt to arrange the transport. [facility] is able to transport the Patient by BLS Ambulance at 4:00 pm. Medicaid approval has been obtained. Invoice # [id number]. SW updated the Patient and her mother (mom will be riding with the Patient). SW updated ED Triage RN, Rollene Fare.     Claretta Fraise, [place]   Emergency Department Social Work  [phone]  Page ID [phone]

## 2017-01-19 NOTE — Progress Notes (Addendum)
[place] [facility]  Transportation Request Form / Physician Certification Statement   Please fax request with face sheet to [facility] Coordinator: Fax [phone], Phone [phone] or [place] [facility]: Fax [phone], Phone [phone]    Patient Name:  Cindy Ochoa     Date of Birth:  June 17, 1986    Date of Service: January 19, 2017  Requested time of pick up: ASAP    Patient Location:  [facility]     MR#  [id number]  Patient Destination: [place], [place] [address]  Number of steps into house?: 3 steps    Requestors Name: Dory Larsen, LMSW Call Back Number: [phone] Payor : Medicaid    Transport for (check what type of treatment or service, at least one):    [x]  Discharge              []  Diagnostic Test      []  Evaluation Test         []  Procedure      Specify what type of treatment or service: Discharge from ED and return to home.    Is this treatment or service available at sending facility?:      []  Yes    [x]  No    Requested Mode of Transport  []  One man Chairmobile** []  Two man Chairmobile**  (** See instruction  form)  Leg extension required:     []  Yes  []  No  Patient has own chair:  []  Yes    []  No   Wheelchair size:         []  Standard []  X-wide  []  XX-wide  []  Round Trip [x]  One Way  []  Stretcher Lucianne Lei (no medical attention needed)    [x]  [facility]  []  [facility]  VENDOR: __[facility]___   1. Medical condition that necessitates this mode of transport (i.e. oxygen, bed ridden, etc.):    Patient is quadriplegic and requires stretcher transport because she can not sit up in a standard wheelchair. The Patient does not have any medical needs. Medicaid was unable to arrange stretcher transportation due to vendor availability.     2. What medical services are to be provided by crew?   []  Oxygen Monitoring:  Amount:        LPM  Can patient self-administer oxygen?  []  Yes []  No - What is limitation?           Does patient have own portable tank?    []  Yes     []  No   []  Airway  Monitoring []  Monitor IV infusion []  Suctioning     []  Cardiac Monitoring []  Vital Signs  []  PRN Meds  []  Other_______________________________  [x]  None    3. Infection control needs (i.e. ORSA/VRE/Cdiff)       []  Yes    [x]  No    4. What specific handling is required?    [x]  Positioning []  Orthopedic Device  []  Restraints []  Other:        Reason for above:  []  Flight Risk     []  Severe Pain []  Morbid Obesity       [x]  Fall Precaution   []  Ortho/Spine Precaution         []  Decubitis > Stage 2 []  Risk of injury to self/others    5.  Patient mental status? [x]  Normal cognition []  Disoriented      Specify:        []  Psychological Disorder Specify:  6. At time of transport is bed confinement ordered?  []  Yes     [x]  No              Is patient bed confined?    []  Yes     [x]  No    Medical condition for bed confinement:     Social work name:  Dory Larsen, LMSW         Date:  January 19, 2017        Signature:___________________________________________________    Title:   []  MD  []  PA  []  NP  []  CNS  []  RN  [x]  SW  [x]  Discharge Planner

## 2017-01-19 NOTE — ED Triage Notes (Signed)
Pt quad, gets frequent muscle spasms. For past 2-3 days they were pretty intense with SOB during episodes.        Triage Note   Dairl Ponder, RN

## 2017-01-19 NOTE — ED Notes (Signed)
Discharged to home by medical transport with mother

## 2017-01-21 ENCOUNTER — Telehealth: Payer: Self-pay | Admitting: Internal Medicine

## 2017-01-21 NOTE — Telephone Encounter (Signed)
Patient called in regards to most recent ED visit   Patient states that she would like to speak with [name], NP     Patient was given Valium for muscle spasms   Patient states that this has been working well and would like a refill of this medication     Patient declined appointment due to being scheduled 02/05   Patient can be reached at [phone] to advise

## 2017-01-21 NOTE — Telephone Encounter (Signed)
Spoke to pt who is requesting more valium.   Writer explained that this medication would not be prescribed over phone.   Advised to only take PRN. She reports she has 6 left.   Writer offered pt sooner appt than 02/01/17 with Lavone Orn, NP.   Pt declined sooner appt, only wants to see [facility].   Advised pt to call SIM if sxs worsen.   Pt agreed with plan of care.

## 2017-01-22 NOTE — Telephone Encounter (Signed)
Prior authorization for Oxybutynin faxed to Medicaid.     If no response via fax, please call [phone], patient's ID number [id number]. [place]/LPN

## 2017-01-25 ENCOUNTER — Telehealth: Payer: Self-pay | Admitting: Urology

## 2017-01-25 ENCOUNTER — Other Ambulatory Visit: Payer: Self-pay | Admitting: Urology

## 2017-01-25 MED ORDER — FESOTERODINE FUMARATE 4 MG PO TB24 *I*
4.0000 mg | ORAL_TABLET | Freq: Every day | ORAL | 3 refills | Status: DC
Start: 2017-01-25 — End: 2017-07-28

## 2017-01-25 NOTE — Telephone Encounter (Signed)
See closed note 01/12/17. Patient returned call. Message from 01/25/17 that Lisbeth Ply was called into pharmacy was provided to patient.

## 2017-01-25 NOTE — Telephone Encounter (Signed)
DENIED      Provider:   Derrek Monaco  Medication:  Oxybutynin ER 10mg   Reason for Denial: Asking if pt can try Toviaz, Vesicare or Oxybutynin.    Denial letter sent for scanning.

## 2017-01-25 NOTE — Telephone Encounter (Signed)
Called and left generic message, due to hippa. Asked patient to call back. Please inform of below. Thanks [employer].

## 2017-01-25 NOTE — Telephone Encounter (Signed)
Called in [place], please inform patient

## 2017-02-01 ENCOUNTER — Ambulatory Visit: Payer: Self-pay | Admitting: Internal Medicine

## 2017-02-01 ENCOUNTER — Encounter: Payer: Self-pay | Admitting: Internal Medicine

## 2017-02-01 ENCOUNTER — Telehealth: Payer: Self-pay | Admitting: Internal Medicine

## 2017-02-01 VITALS — BP 100/58 | HR 100 | Temp 98.2°F | Ht 65.0 in | Wt 163.0 lb

## 2017-02-01 DIAGNOSIS — G8253 Quadriplegia, C5-C7 complete: Secondary | ICD-10-CM

## 2017-02-01 DIAGNOSIS — R252 Cramp and spasm: Secondary | ICD-10-CM

## 2017-02-01 DIAGNOSIS — Z9889 Other specified postprocedural states: Secondary | ICD-10-CM

## 2017-02-01 MED ORDER — MIRTAZAPINE 7.5 MG PO TABS *A*
7.5000 mg | ORAL_TABLET | Freq: Every evening | ORAL | 1 refills | Status: DC
Start: 2017-02-01 — End: 2017-07-28

## 2017-02-01 MED ORDER — DIAZEPAM 2 MG PO TABS *I*
2.0000 mg | ORAL_TABLET | Freq: Every evening | ORAL | 0 refills | Status: DC | PRN
Start: 2017-02-01 — End: 2017-02-17

## 2017-02-01 NOTE — Progress Notes (Signed)
Reason For Visit: The primary encounter diagnosis was Spasticity. Diagnoses of H/O tracheostomy and Quadriplegia, C5-C7 complete were also pertinent to this visit.      HPI:  Cindy Ochoa is 31 y.o. year old female with Paraplegic, spasticity, H/O tracheostomy, quadriplegia, C5-C6.      She recent ED visit for spasticity   She was given valium that she feels is working  She is (prescribed dantrium 50 mg 4 times a day  Patient reports it helps some but did not alleviate spasms  She is tried baclofen and Flexeril in the past    Patient had been followed by PMR but it has missed several appointments    Patient was seen by Dr.Inzerilo to discuss medical marijuana for spasticity  Patient reports it was $400 per month and to expensive  She will continue to bite HISTORY    Patient is previously trached  She is paraplegic  Patient reports she feels short of breath at at bedtime and wonders if she needs oxygen.  She has needed oxygen in the past  Patient smokes marijuana as well as cigarettes    She feels depressed at times  She is out of her Remeron 7.5 which is helping with mood as well as sleep    Patient is currently working as a Market researcher at [employer] : Patient is been having issues with her aid service  She now has a steady aid  that she trusts    Medications:     Current Outpatient Prescriptions   Medication Sig    mirtazapine (REMERON) 7.5 MG tablet Take 1 tablet (7.5 mg total) by mouth nightly    diazepam (VALIUM) 2 MG tablet Take 1 tablet (2 mg total) by mouth nightly as needed (muscle spasm)   Max daily dose: 2 mg    fesoterodine (TOVIAZ) 4 MG 24 hr tablet Take 1 tablet (4 mg total) by mouth daily    incontinence supply disposable Large pull ups - use up to 5 x daily  Dx N39.46    disposable underpads 30"x36" (CHUX) Use 6 times daily and PRN. Dx N39.42  Incontinence without sensory awareness    disposable gloves 2 boxes Disposable Medium size gloves N39.46    Non-System Medication  Urostomy drainage bags change as needed. Dx N39.46 and  G82.54    dantrolene (DANTRIUM) 50 MG capsule Take 1 capsule (50 mg total) by mouth 4 times daily    Non-System Medication Leg Bag 1000mg  - diagnosis G82.53 N39.96    MAPAP 500 MG tablet take 1 tablet by mouth every 4 to 6 hours if needed for pain    etonogestrel (NEXPLANON) 68 MG IMPL Inject 68 mg into the skin once   Placed 10/20/16    ibuprofen (ADVIL,MOTRIN) 600 MG tablet Take 1 tablet (600 mg total) by mouth 4 times daily as needed    senna-docusate (PERICOLACE) 8.6-50 MG per tablet Take 2 tablets by mouth daily as needed for Constipation    generic DME Use as directed. Hospital bed with Low air loss mattress. Duration of use: 1 year.    psyllium (METAMUCIL) 95 % packet Take 1 packet by mouth daily    hospital bed Use as directed    bisacodyl (BISAC-EVAC) 10 MG suppository 1-2 as needed every other day    Non-System Medication Gel overlay mattress for hospital bed - diagnosis G82.54, L89.93, L89.159    Non-System Medication Hospital Bed with Air Mattress - Diagnosis G82.54, L89.93,L89.159  Medicaid [id number]    generic DME Left fifth digit finger splint for sprain.    albuterol HFA 108 (90 BASE) MCG/ACT inhaler Inhale 1-2 puffs into the lungs every 6 hours as needed   Shake well before each use.    nitrofurantoin monohydrate macrocrystal (MACROBID) 100 MG capsule Take 1 capsule (100 mg total) by mouth 2 times daily    Spacer/Aero-Holding Chambers (EASIVENT) spacer Use as instructed    Non-System Medication Medication/Supply: [employer] fast streamer with Sling  Directions for Use: use prn    Non-System Medication Medication/Supply: Replacement Legrest assy parts for Power Wheelchair  Directions for Use: repair power wheelchair    disposable gloves 2 boxes Disposable Medium size gloves    generic DME Use as directed. Hoyer lift. Duration of use: lifetime. ICD:10-G82.53    dantrolene (DANTRIUM) 25 MG capsule 1 tablet orally once a day for 3 days then  take twice a day for 3 days then take 3 times a day until follow-up.    polyethylene glycol (GLYCOLAX) powder Take 17 g by mouth daily   Mix in 8 oz water or juice and drink.    Non-System Medication Shower Chair - Quadriparesis G82.54    Ostomy Supplies (HOLLISTER REPLACEMENT FILTERS) [place] (order # [id number]).    ARTIFICIAL TEARS 1.4 % ophthalmic solution PLACE 1 DROP INTO BOTH EYES FOUR TIMES DAILY    Non-System Medication PRI AND SCREEN  DIAGNOSIS 344.04 Quadriparesis    white petrolatum-mineral oil (GENTEAL PM) 85-15 % ophthalmic ointment Place into both eyes nightly    Skin Protectants, Misc. OINT Apply 1 Tube topically 2 times daily      Non-System Medication One Hoyer Lift  Diagnosis:  Quadriplegic ICD-9 344.04     Current Facility-Administered Medications   Medication    lidocaine (XYLOCAINE) 4 % external solution       Medication list reconciled this visit    Allergies:     Allergies   Allergen Reactions    Nitrofurantoin Nausea And Vomiting    Vancomycin Hives     hives 2006 but tolerated Rx in 2010  pt states she had vancomycin in 04/2012.  Feels this is not true allergy  as she has received it recently - had no reaction     Heparin Other (See Comments)     Thrombocytopenia;        Social history      Social History   Substance Use Topics    Smoking status: Current Every Day Smoker     Packs/day: 0.25     Years: 3.00     Types: Cigarettes     Start date: 12/09/2011     Last attempt to quit: 07/30/2016    Smokeless tobacco: Former Systems developer     Quit date: 04/29/2005      Comment: ~3 cigs a day    Alcohol use No       Review of Systems     Review of Systems   Constitutional: Negative for chills and fever.   HENT: Negative for congestion and ear discharge.    Respiratory: Positive for shortness of breath (at night ). Negative for cough and wheezing.    Cardiovascular: Negative for chest pain, palpitations and leg swelling.   Gastrointestinal: Negative for abdominal pain.   Skin: Negative  for rash.   Neurological: Negative for dizziness.        Physical Exam:     Physical Exam   Constitutional: She is oriented to person, place, and  time. She appears well-developed.   Sitting in a motorized wheelchair  She clearly does have spasms   Cardiovascular: Normal rate, regular rhythm and normal heart sounds.    No edema    Pulmonary/Chest: Effort normal and breath sounds normal. No respiratory distress. She has no wheezes. She has no rales.   Abdominal: Soft. Bowel sounds are normal. There is no tenderness.   Lymphadenopathy:     She has no cervical adenopathy.   Neurological: She is alert and oriented to person, place, and time.   Skin: Skin is warm and dry.   Psychiatric:   Depressed and sleeping well  Frustrated about aid service             Vitals:    02/01/17 1323   BP: 100/58   Pulse: 100   Temp: 36.8 C (98.2 F)   TempSrc: Oral   Weight: 73.9 kg (163 lb)   Height: 1.651 m (5\' 5" )     Wt Readings from Last 3 Encounters:   02/01/17 73.9 kg (163 lb)   01/19/17 73.9 kg (163 lb)   01/07/17 73.9 kg (163 lb)     BP Readings from Last 3 Encounters:   02/01/17 100/58   01/19/17 117/57   01/07/17 131/81           ASSESSMENT/PLAN:     Spasticity        Encouraged patient to restart dantrium 50 mg 4 times daily        Give her prescription for Valium 2 mg 10 tablets.        Discussed with patient she is only to use with uncontrolled spasms and not daily         We'll need to talk to the primary care team to evaluate if we will continue prescribed         Discussed with Patient is a legal marijuana use         She was subject to urine tox is as well as pain contract         I will discuss with primary care team and call patient         Encouraged her to follow-up with PMR     diazepam (VALIUM) 2 MG tablet; Take 1 tablet (2 mg total) by mouth nightly as needed (muscle spasm)   Max daily dose: 2 mg      H/O tracheostomy/Quadriplegia, C5-C7 complete/shortness of breath nighttime     Encouraged patient to quit  smoking     Ordered overnight oximetry    Depression  -     Support given         Restart mirtazapine (REMERON) 7.5 MG tablet; Take 1 tablet (7.5 mg total) by mouth nightly            Follow up: Follow-up with CCP        Lavone Orn,  Nurse Practitioner  [facility] Internal  Medicine

## 2017-02-01 NOTE — Telephone Encounter (Signed)
-----   Message from Sherlon Handing, NP sent at 02/01/2017  1:43 PM EST -----  Need to do over night oximetry

## 2017-02-02 ENCOUNTER — Telehealth: Payer: Self-pay | Admitting: Internal Medicine

## 2017-02-02 ENCOUNTER — Encounter: Payer: Self-pay | Admitting: Gastroenterology

## 2017-02-02 NOTE — Telephone Encounter (Signed)
Form Faxed to [facility] for overnight oximetry.

## 2017-02-02 NOTE — Telephone Encounter (Signed)
-----   Message from Sherlon Handing, NP sent at 02/01/2017  1:43 PM EST -----  Need to do over night oximetry

## 2017-02-02 NOTE — Telephone Encounter (Signed)
Writer spoke [place] with [facility].   She will need form completed by provider and faxed with Dianosis/ICD code, O2 saturations at rest,walking, standing.   Writer gave Lavone Orn, NP form to complete.

## 2017-02-03 ENCOUNTER — Encounter: Payer: Self-pay | Admitting: Surgery

## 2017-02-04 ENCOUNTER — Encounter: Payer: Self-pay | Admitting: Gastroenterology

## 2017-02-05 ENCOUNTER — Encounter: Payer: Self-pay | Admitting: Gastroenterology

## 2017-02-05 NOTE — Telephone Encounter (Signed)
Overnight oximetry testing referral was completed.  Please see 02/02/17 phone encounter

## 2017-02-09 ENCOUNTER — Telehealth: Payer: Self-pay | Admitting: Internal Medicine

## 2017-02-09 NOTE — Telephone Encounter (Signed)
Attempted to contact patient, VM message left with date and time of her next appointment with Dr Modesta Messing.   Message left that over night oxygen testing results were good.

## 2017-02-09 NOTE — Telephone Encounter (Signed)
I just got over night oxygen report. Oxygen is in the 90% most for the time above 95 %.  She does not qualify for oxygen at night.   Remind her she has ov with ccp next week to talk about diazepam rx    [place], NP

## 2017-02-10 ENCOUNTER — Ambulatory Visit: Payer: Self-pay | Admitting: Student in an Organized Health Care Education/Training Program

## 2017-02-10 ENCOUNTER — Encounter: Payer: Self-pay | Admitting: Gastroenterology

## 2017-02-17 ENCOUNTER — Encounter: Payer: Self-pay | Admitting: Student in an Organized Health Care Education/Training Program

## 2017-02-17 ENCOUNTER — Ambulatory Visit: Payer: Self-pay | Admitting: Student in an Organized Health Care Education/Training Program

## 2017-02-17 VITALS — BP 106/56 | HR 104 | Temp 98.4°F | Ht 65.0 in | Wt 163.0 lb

## 2017-02-17 DIAGNOSIS — R252 Cramp and spasm: Secondary | ICD-10-CM

## 2017-02-17 DIAGNOSIS — Z79899 Other long term (current) drug therapy: Secondary | ICD-10-CM | POA: Insufficient documentation

## 2017-02-17 MED ORDER — DIAZEPAM 2 MG PO TABS *I*
2.0000 mg | ORAL_TABLET | Freq: Three times a day (TID) | ORAL | 0 refills | Status: DC | PRN
Start: 2017-02-17 — End: 2017-03-30

## 2017-02-17 NOTE — Progress Notes (Signed)
[facility]: Progress Note    Chief Complaint: Follow-Up    Subjective:      Cindy Ochoa is a 31 yo female with hx of quadriparesis at c6, autonomic dysfunction, neurogenic bladder and bowel who presents today for follow-up.      Quadriparesis at C6/Autonomic Dysfunction/Neurogenic bladder and bowel incontience - She has had difficulty with her aide services recently including one aide who left her alone while she was in the shower.  Nurses from wound care had asked what they do to calm her legs.  When she is laying down she notices her legs get very jumpy.  She states that the spasms travel up her legs to her stomach and then her hand shakes.  She has been on baclofen in the past which she states didn't work.  She had been on flexeril previously and we tried it at her last visit which did not work.  She was recently put on dantrolene for this by PMR but it did not work.  Recently was started on valium which worked well for her spasms.    Constipation - has having some hard BMs on miralax, bisacodyl and percolace.      Medications:     Current Outpatient Prescriptions on File Prior to Visit   Medication Sig Dispense Refill    mirtazapine (REMERON) 7.5 MG tablet Take 1 tablet (7.5 mg total) by mouth nightly 30 tablet 1    diazepam (VALIUM) 2 MG tablet Take 1 tablet (2 mg total) by mouth nightly as needed (muscle spasm)   Max daily dose: 2 mg 10 tablet 0    fesoterodine (TOVIAZ) 4 MG 24 hr tablet Take 1 tablet (4 mg total) by mouth daily 30 tablet 3    incontinence supply disposable Large pull ups - use up to 5 x daily  Dx N39.46 150 each 5    disposable underpads 30"x36" (CHUX) Use 6 times daily and PRN. Dx N39.42  Incontinence without sensory awareness 300 each 5    disposable gloves 2 boxes Disposable Medium size gloves N39.46 200 each 6    Non-System Medication Urostomy drainage bags change as needed. Dx N39.46 and  G82.54 10 each 5    dantrolene (DANTRIUM) 50 MG capsule  Take 1 capsule (50 mg total) by mouth 4 times daily 120 capsule 0    Non-System Medication Leg Bag 1000mg  - diagnosis G82.53 N39.96 21 each 4    MAPAP 500 MG tablet take 1 tablet by mouth every 4 to 6 hours if needed for pain 180 tablet 5    etonogestrel (NEXPLANON) 68 MG IMPL Inject 68 mg into the skin once   Placed 10/20/16      ibuprofen (ADVIL,MOTRIN) 600 MG tablet Take 1 tablet (600 mg total) by mouth 4 times daily as needed 50 tablet 1    senna-docusate (PERICOLACE) 8.6-50 MG per tablet Take 2 tablets by mouth daily as needed for Constipation 30 tablet 5    generic DME Use as directed. Hospital bed with Low air loss mattress. Duration of use: 1 year. 1 each 0    psyllium (METAMUCIL) 95 % packet Take 1 packet by mouth daily 30 packet 2    hospital bed Use as directed 1 each 0    bisacodyl (BISAC-EVAC) 10 MG suppository 1-2 as needed every other day 50 suppository 3    Non-System Medication Gel overlay mattress for hospital bed - diagnosis G82.54, L89.93, L89.159 1 each 0    Non-System  Medication Hospital Bed with Air Mattress - Diagnosis G82.54, L89.93,L89.159  Medicaid [id number] 1 each 0    generic DME Left fifth digit finger splint for sprain. 1 each 0    albuterol HFA 108 (90 BASE) MCG/ACT inhaler Inhale 1-2 puffs into the lungs every 6 hours as needed   Shake well before each use. 1 Inhaler 0    nitrofurantoin monohydrate macrocrystal (MACROBID) 100 MG capsule Take 1 capsule (100 mg total) by mouth 2 times daily 20 capsule 0    Spacer/Aero-Holding Chambers (EASIVENT) spacer Use as instructed 1 each 0    Non-System Medication Medication/Supply: [employer] fast streamer with Sling  Directions for Use: use prn 1 each 0    Non-System Medication Medication/Supply: Replacement Legrest assy parts for Power Wheelchair  Directions for Use: repair power wheelchair 1 each 0    disposable gloves 2 boxes Disposable Medium size gloves 200 each 6    generic DME Use as directed. Hoyer lift. Duration of use: lifetime.  ICD:10-G82.53 1 each 0    dantrolene (DANTRIUM) 25 MG capsule 1 tablet orally once a day for 3 days then take twice a day for 3 days then take 3 times a day until follow-up. 44 capsule 1    polyethylene glycol (GLYCOLAX) powder Take 17 g by mouth daily   Mix in 8 oz water or juice and drink. 255 g 1    Non-System Medication Shower Chair - Quadriparesis G82.54 1 each 0    Ostomy Supplies (HOLLISTER REPLACEMENT FILTERS) MISC Hollister Adapter (order # [id number]). 1 each 1    ARTIFICIAL TEARS 1.4 % ophthalmic solution PLACE 1 DROP INTO BOTH EYES FOUR TIMES DAILY 15 mL 10    Non-System Medication PRI AND SCREEN  DIAGNOSIS 344.04 Quadriparesis 1 each 0    white petrolatum-mineral oil (GENTEAL PM) 85-15 % ophthalmic ointment Place into both eyes nightly 3.5 mL 6    Skin Protectants, Misc. OINT Apply 1 Tube topically 2 times daily   1 Tube 11    Non-System Medication One Hoyer Lift  Diagnosis:  Quadriplegic ICD-9 344.04 1 each 0     Current Facility-Administered Medications on File Prior to Visit   Medication Dose Route Frequency Provider Last Rate Last Dose    lidocaine (XYLOCAINE) 4 % external solution   Topical PRN Schliff, Magnus Ivan, NP           Medications reviewed and reconciled.   Allergies:     Allergies   Allergen Reactions    Nitrofurantoin Nausea And Vomiting    Vancomycin Hives     hives 2006 but tolerated Rx in 2010  pt states she had vancomycin in 04/2012.  Feels this is not true allergy  as she has received it recently - had no reaction     Heparin Other (See Comments)     Thrombocytopenia;        Review of Systems:     Pertinent positives and negatives as per HPI.     Physical Exam:     Vitals:    02/17/17 1356   BP: 106/56   Pulse: 104   Temp: 36.9 C (98.4 F)   TempSrc: Oral   Weight: 73.9 kg (163 lb)   Height: 1.651 m (5\' 5" )     Wt Readings from Last 3 Encounters:   02/17/17 73.9 kg (163 lb)   02/01/17 73.9 kg (163 lb)   01/19/17 73.9 kg (163 lb)     Body mass index is 27.12 kg/(m^2).  BP  Readings from Last 3 Encounters:   02/17/17 106/56   02/01/17 100/58   01/19/17 117/57       General: Well-appearing, NAD, AOx3  HEENT: NCAT  Lymphatics: No LAD  Pulmonary: CTA b/l, Normal WOB  Cardiovascular: RRR, normal S1/S2, no M/R/G  Abdominal: soft, nt, nd, +BS  Extremities: wwp, no edema  Skin: No rashes or lesions    Assessment and Plan:     Ms. Woolums is a 31 yo female with hx of quadriparesis at c6, autonomic dysfunction, neurogenic bladder and bowel who presents today for follow-up.    Quadriparesis at C6/Autonomic Dysfunction/Neurogenic bladder and bowel incontinence    - continue aide services    Constipation     - continue metamcuil, miralax, percolace  - suppositories as needed  - continue regular diet    Leg Spasms    - valium 2 mg tid prn  - pain contract signed and pain clinic profile ordered  - no longer taking flexeril or baclofen or  - PMR considering medical marijuana but patient states not interested in it due to cost    RTC in 2 months    Cindy Speller, MD   Internal Medicine, PGY-3

## 2017-02-18 ENCOUNTER — Encounter: Payer: Self-pay | Admitting: Gastroenterology

## 2017-02-24 ENCOUNTER — Encounter: Payer: Self-pay | Admitting: Surgery

## 2017-02-25 ENCOUNTER — Other Ambulatory Visit
Admission: RE | Admit: 2017-02-25 | Discharge: 2017-02-25 | Disposition: A | Discharge status: Home / Self Care | Payer: Self-pay | Source: Ambulatory Visit | Attending: Internal Medicine | Admitting: Internal Medicine

## 2017-02-25 DIAGNOSIS — R252 Cramp and spasm: Secondary | ICD-10-CM

## 2017-02-25 LAB — PAIN CLINIC PROFILE
Amphetamine,UR: NEGATIVE
Benzodiazepinen,UR: POSITIVE
Cocaine/Metab,UR: NEGATIVE
Opiates,UR: NEGATIVE
Oxycodone/Oxymorphone,UR: NEGATIVE
THC Metabolite,UR: POSITIVE

## 2017-02-26 LAB — CONFIRM THC METABOLITE, URINE: Confirm THC Metab: POSITIVE

## 2017-02-26 LAB — THC SEMI-QUANT, URINE
Creatinine,UR: 133 mg/dL (ref 20–300)
Semi-Quant THC,UR: 240 ng/mL
THC/Creat Ratio: 180 ng/mg

## 2017-03-01 ENCOUNTER — Encounter: Payer: Self-pay | Admitting: Orthopedic Surgery

## 2017-03-01 ENCOUNTER — Encounter: Payer: Self-pay | Admitting: Bariatric Surgery

## 2017-03-01 LAB — BENZODIAZEPINE, CONFIRMATION, URINE: Confirm BZD: POSITIVE

## 2017-03-18 ENCOUNTER — Encounter: Payer: Self-pay | Admitting: Surgery

## 2017-03-19 ENCOUNTER — Ambulatory Visit: Payer: Self-pay | Admitting: Surgery

## 2017-03-26 ENCOUNTER — Telehealth: Payer: Self-pay | Admitting: Internal Medicine

## 2017-03-26 NOTE — Telephone Encounter (Addendum)
Patient's mother came in and wanted a letter to be written by the patient's doctor, stating that the patient is unable to be removed from her wheelchair due to her medical condition.     The patient and the mother are planning to visit a family member in prison, and will need this letter so that the patient will not be asked to leave the chair at the security checkpoint.     Per patient request, please make sure that letter also states that patient needs to stay in her own chair, not be transported to a manual wheelchair, considering her complications with balance.     Please mail the letter to this patient's address. Address was verified. Patient is waiting on this letter before they can go visit.     Routing to [place] team.

## 2017-03-30 ENCOUNTER — Other Ambulatory Visit: Payer: Self-pay | Admitting: Internal Medicine

## 2017-03-30 DIAGNOSIS — S14105S Unspecified injury at C5 level of cervical spinal cord, sequela: Secondary | ICD-10-CM

## 2017-03-30 DIAGNOSIS — M62838 Other muscle spasm: Secondary | ICD-10-CM

## 2017-03-30 NOTE — Telephone Encounter (Signed)
Controlled Substance follow-up Assessment     Name of Medication: Valium      Last Toxicology Screen with results:    Pain Clinic Profile:      Lab results: 02/25/17  1410   Amphetamine,UR NEG   Cocaine/Metab,UR NEG   Opiates,UR NEG   Oxycodone/Oxymorphone,UR NEG   THC Metabolite,UR POS   Benzodiazepinen,UR POS   Confirm THC Metab POS   Creatinine,UR 133        Methadone Metabolite, Urine:  No results for input(s): EDDPU in the last 8760 hours.     Fentanyl, Urine:  No results for input(s): FENT in the last 8760 hours.     Tramadol and Metabolites:  No results for input(s): TRAMD, OTRMD, NTRMD in the last 8760 hours.     Missed appointments:  Recent No Show Appointments     Visit Type Date Time Department    FUV W/ HOYER/STRETCHER 02/24/2017 11:00 AM [facility]    FUV W/ HOYER/STRETCHER 02/03/2017  9:40 AM [facility]    [facility] 12/31/2016  9:30 AM [facility]    FUV W/ HOYER/STRETCHER 12/11/2016 10:20 AM [facility]    FOLLOW UP VISIT 11/16/2016  8:45 AM [facility]    FOLLOW UP VISIT 10/20/2016  1:00 PM [facility]          Last Clinic Appointment: 02/17/2017      Future Appointments  Date Time Provider [place]   04/06/2017 10:00 AM Alden Server, MD [facility] None   04/20/2017 2:30 PM Malinou, Roxanne Mins, MD [facility] None   05/31/2017 12:00 PM Poduri, Donnal Moat, MD [facility] None     Rx Written Rx Dispensed Drug Quantity Days Supply Prescriber Name Payment Method Dispenser   02/17/2017 02/23/2017 diazepam 2 mg tablet  90 30 [name] MD [facility] [phone]   Others' Prescriptions  Patient Name: Cindy Ochoa Birth Date: [birth year]   Address: [address] [place], [place] [address] Sex: Female   Rx Written Rx Dispensed Drug Quantity Days Supply Prescriber Name Payment Method Dispenser   02/01/2017 02/02/2017 diazepam 2 mg tablet  10 10 Sherlon Handing NP [facility] [place] [phone]   01/19/2017 01/19/2017 diazepam 2 mg tablet  10 3 [place], [place] [place] [phone]   * - Drugs marked with an asterisk are compound drugs. If the compound drug is made up of more than one controlled substance, then each controlled substance will be a separate row in the table.      Report Suspicious Activity   Send Questions/Comments   Substance Abuse Treatment Information     Click the Report Suspicious Activity button to report information related to controlled substance suspicious activity to the [employer] of Narcotic Enforcement.   Click the Send Questions/Comments button to send questions about this report to the [facility] of Narcotic Enforcement, or call [phone].   Click the Substance Abuse Treatment Information button to go to the Office of Alcoholism and Substance Abuse Services website, www.oasas.thelispenard.com or call 910-701-2788.

## 2017-03-31 NOTE — Telephone Encounter (Signed)
Refilled diazepam.  Patient has an upcoming appt with Dr. Modesta Messing this month.    [name], MD

## 2017-04-06 ENCOUNTER — Ambulatory Visit: Payer: MEDICAID | Attending: Urology | Admitting: Urology

## 2017-04-06 ENCOUNTER — Encounter: Payer: Self-pay | Admitting: Urology

## 2017-04-06 VITALS — BP 123/66 | HR 106 | Temp 97.9°F | Ht 65.0 in | Wt 162.9 lb

## 2017-04-06 DIAGNOSIS — R32 Unspecified urinary incontinence: Secondary | ICD-10-CM | POA: Insufficient documentation

## 2017-04-06 DIAGNOSIS — N319 Neuromuscular dysfunction of bladder, unspecified: Secondary | ICD-10-CM | POA: Insufficient documentation

## 2017-04-06 LAB — POCT SIEMENS URINALYSIS DIPSTICK
Glucose,UA POCT: NEGATIVE mg/dL
Ketones,UA POCT: NEGATIVE mg/dL
Kit Lot: 706075
Nitrite,UA POCT: POSITIVE — AB
pH,UA POCT: 6.5 (ref 5.0–8.0)
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
## 2017-04-16 ENCOUNTER — Encounter: Payer: Self-pay | Admitting: Gastroenterology

## 2017-04-20 ENCOUNTER — Ambulatory Visit: Payer: MEDICAID | Admitting: Student in an Organized Health Care Education/Training Program

## 2017-04-23 ENCOUNTER — Encounter: Payer: Self-pay | Admitting: Surgery

## 2017-04-28 ENCOUNTER — Telehealth: Payer: Self-pay | Admitting: Urology

## 2017-04-28 NOTE — Telephone Encounter (Signed)
Called patient, left VM.

## 2017-04-28 NOTE — Telephone Encounter (Signed)
Patient is requesting a call back from Dr. [name] to discuss plan of care from last [place] on 04/06/2017.    Patient can be reached at [phone].

## 2017-04-28 NOTE — Telephone Encounter (Signed)
Moved from nurse pool

## 2017-05-02 ENCOUNTER — Other Ambulatory Visit: Payer: Self-pay | Admitting: Internal Medicine

## 2017-05-06 NOTE — Telephone Encounter (Signed)
Letter has been mailed to the patient 

## 2017-05-07 ENCOUNTER — Encounter: Payer: Self-pay | Admitting: Surgery

## 2017-05-13 ENCOUNTER — Encounter: Payer: Self-pay | Admitting: Surgery

## 2017-05-13 ENCOUNTER — Ambulatory Visit: Payer: MEDICAID | Attending: Internal Medicine | Admitting: Surgery

## 2017-05-13 VITALS — BP 114/55 | HR 103 | Temp 96.5°F | Resp 16 | Ht 65.0 in

## 2017-05-13 DIAGNOSIS — L89153 Pressure ulcer of sacral region, stage 3: Secondary | ICD-10-CM | POA: Insufficient documentation

## 2017-05-13 DIAGNOSIS — L89312 Pressure ulcer of right buttock, stage 2: Secondary | ICD-10-CM | POA: Insufficient documentation

## 2017-05-13 DIAGNOSIS — G8250 Quadriplegia, unspecified: Secondary | ICD-10-CM | POA: Insufficient documentation

## 2017-05-13 MED ORDER — LIDOCAINE HCL 2 % EX JELLY WRAPPED *I*
CUTANEOUS | Status: AC | PRN
Start: 2017-05-13 — End: 2017-07-12

## 2017-05-13 NOTE — Patient Instructions (Addendum)
Please follow these instructions about how to care for your wound.     Please get blood drawn in the next couple of weeks.     Wound Location Dressing Orders Frequency of Dressing Change   Location: Sacral                          Location: Right ischium Calmoseptine to peri wound  Cut square of hydrafera blue into 3 strips  Wet one strip before each dressing change (save the other 2 into a baggie)  Ring out excess water  Pack wound with damp hydrafera blue, leave a small tail hanging out  Cover with ABD pad  Tape      Calmoseptine around wound  abd pad to cover    Vitamin a+d to dry areas or vaseline       [x] When the hydrafera blue changes white (instead of blue), you should change the dressing  You can change the abd pad as often as necessary     Cleanse Wound(s) with:    [x] Soap &Water with dressing changes, keep dressing dry in between       [x] Leg Elevation: Elevate leg(s) to level of heart   or above for 30 minutes 3 times per day.  [x] Right   Leg [x] Left   Leg       [x] Assistive Device(s): [x] Wheelchair    [x] Pressure Reduction: [x] Wheelchair Cushion   [x] Specialty Bed [x] Reposition every 2 hours   [x] Limit time up in your chair to 2 hours or less at a time      [x] General: [x] Stop / Decrease Smoking         []   Discharge from [facility] Discharge Instructions    Following the instructions below will give you the best opportunity for wound healing.    Wound Care Instructions (If changing own dressing):                        Gather all supplies you will need to change your dressing.                        Before changing your dressing, wash your hands for at least 15 seconds with warm soapy water.                        Rinse off all soap, then dry with a towel.                        Remove the old dressing.  Wash hands again before applying the new dressing.    If you experience any of the following during our  business hours of 8 AM - 4:30 PM, Monday - Friday, please call the [facility] at [phone]:    ? Increase in pain  ?Temperature over 101F   ?Drainage with a foul odor   ?Bleeding   ? Increase in swelling  ? Increase in drainage from your wound  ? Need for compression bandage changes (slippage, breakthrough drainage)    Please contact your primary care physician or proceed to the nearest emergency room if you experience any of the above after our business hours.     Please note your appointment(s) above - if you are unable to keep, kindly give 24 hours' notice.  Thank You!
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
## 2017-05-17 ENCOUNTER — Telehealth: Payer: Self-pay | Admitting: Urology

## 2017-05-17 NOTE — Telephone Encounter (Signed)
Does she need new scripts?  I'm not sure what ostomy supplies she needs  Please find out

## 2017-05-17 NOTE — Telephone Encounter (Signed)
Staff,   Please see if order sheet was received from [facility] for provider to sign, if not, please call [facility] and have them fax the form. Thanks [employer].

## 2017-05-17 NOTE — Telephone Encounter (Signed)
Patient is calling to request an order for her urostomy supplies through [facility]. Patient states she tried contacting them herself but when she gave Dr. Janeann Merl name they informed her that he wasn't the ordering provider so she couldn't reorder. Their number is: [phone]    Please return her call at: [phone]

## 2017-05-17 NOTE — Telephone Encounter (Signed)
Unable to reach pt or leave message per HIPPA/

## 2017-05-18 NOTE — Telephone Encounter (Signed)
Our office has not received script for cath supplies.     Thank You.

## 2017-05-18 NOTE — Telephone Encounter (Signed)
We can renew her ostomy supplies, she has a urostomy.

## 2017-05-19 ENCOUNTER — Encounter: Payer: Self-pay | Admitting: Gastroenterology

## 2017-05-22 ENCOUNTER — Encounter: Payer: Self-pay | Admitting: Emergency Medicine

## 2017-05-22 ENCOUNTER — Emergency Department
Admission: EM | Admit: 2017-05-22 | Discharge: 2017-05-22 | Disposition: A | Discharge status: Home / Self Care | Payer: MEDICAID | Source: Ambulatory Visit | Attending: Emergency Medicine | Admitting: Emergency Medicine

## 2017-05-22 DIAGNOSIS — F17210 Nicotine dependence, cigarettes, uncomplicated: Secondary | ICD-10-CM | POA: Insufficient documentation

## 2017-05-22 DIAGNOSIS — R61 Generalized hyperhidrosis: Secondary | ICD-10-CM

## 2017-05-22 DIAGNOSIS — R319 Hematuria, unspecified: Secondary | ICD-10-CM

## 2017-05-22 DIAGNOSIS — R3989 Other symptoms and signs involving the genitourinary system: Secondary | ICD-10-CM

## 2017-05-22 DIAGNOSIS — N390 Urinary tract infection, site not specified: Secondary | ICD-10-CM | POA: Insufficient documentation

## 2017-05-22 LAB — CBC AND DIFFERENTIAL
Baso # K/uL: 0 10*3/uL (ref 0.0–0.1)
Basophil %: 0 %
Eos # K/uL: 0 10*3/uL (ref 0.0–0.4)
Eosinophil %: 0 %
Hematocrit: 45 % (ref 34–45)
Hemoglobin: 14.8 g/dL (ref 11.2–15.7)
Lymph # K/uL: 2.3 10*3/uL (ref 1.2–3.7)
Lymphocyte %: 30.5 %
MCH: 31 pg/cell (ref 26–32)
MCHC: 33 g/dL (ref 32–36)
MCV: 94 fL (ref 79–95)
Mono # K/uL: 0.3 10*3/uL (ref 0.2–0.9)
Monocyte %: 4.2 %
Neut # K/uL: 4.6 10*3/uL (ref 1.6–6.1)
Nucl RBC # K/uL: 0 10*3/uL (ref 0.0–0.0)
Nucl RBC %: 0.1 /100 WBC (ref 0.0–0.2)
Platelets: 124 10*3/uL — ABNORMAL LOW (ref 160–370)
RBC: 4.8 MIL/uL (ref 3.9–5.2)
RDW: 14.7 % — ABNORMAL HIGH (ref 11.7–14.4)
Seg Neut %: 64.5 %
WBC: 7.1 10*3/uL (ref 4.0–10.0)

## 2017-05-22 LAB — DIFF MANUAL
Diff Based On: 118 CELLS
React Lymph %: 1 % (ref 0–6)

## 2017-05-22 LAB — URINALYSIS REFLEX TO CULTURE
Ketones, UA: NEGATIVE
Nitrite,UA: NEGATIVE
Protein,UA: 30 mg/dL — AB
Specific Gravity,UA: 1.012 (ref 1.002–1.030)
pH,UA: 6 (ref 5.0–8.0)

## 2017-05-22 LAB — PLASMA PROF 7 (ED ONLY)
Anion Gap,PL: 16 (ref 7–16)
CO2,Plasma: 22 mmol/L (ref 20–28)
Chloride,Plasma: 104 mmol/L (ref 96–108)
Creatinine: 0.62 mg/dL (ref 0.51–0.95)
GFR,Black: 138 *
GFR,Caucasian: 120 *
Glucose,Plasma: 131 mg/dL — ABNORMAL HIGH (ref 60–99)
Sodium,Plasma: 142 mmol/L (ref 133–145)
UN,Plasma: 12 mg/dL (ref 6–20)

## 2017-05-22 LAB — URINE MICROSCOPIC (IQ200)
Hyaline Casts,UA: 8 /lpf — ABNORMAL HIGH (ref 0–2)
RBC,UA: 11 /hpf — AB (ref 0–2)
WBC,UA: 110 /hpf — AB (ref 0–5)

## 2017-05-22 LAB — LACTATE, PLASMA
Lactate: 2.2 mmol/L (ref 0.5–2.2)
Lactate: 1.7 mmol/L (ref 0.5–2.2)

## 2017-05-22 LAB — HOLD SST

## 2017-05-22 LAB — HOLD BLUE

## 2017-05-22 LAB — POTASSIUM

## 2017-05-22 MED ORDER — CEFTRIAXONE SODIUM 1 G IN STERILE WATER 10ML SYRINGE *I*
1.0000 g | Freq: Once | INTRAVENOUS | Status: AC
Start: 2017-05-22 — End: 2017-05-22
  Administered 2017-05-22: 1 g via INTRAVENOUS
  Filled 2017-05-22: qty 10

## 2017-05-22 MED ORDER — ACETAMINOPHEN 325 MG PO TABS *I*
650.0000 mg | ORAL_TABLET | Freq: Once | ORAL | Status: AC
Start: 2017-05-22 — End: 2017-05-22
  Administered 2017-05-22: 650 mg via ORAL
  Filled 2017-05-22: qty 2

## 2017-05-22 MED ORDER — SODIUM CHLORIDE 0.9 % IV BOLUS *I*
1000.0000 mL | Freq: Once | Status: AC
Start: 2017-05-22 — End: 2017-05-22
  Administered 2017-05-22: 1000 mL via INTRAVENOUS

## 2017-05-22 MED ORDER — CEFPODOXIME PROXETIL 200 MG PO TABS *I*
200.0000 mg | ORAL_TABLET | Freq: Two times a day (BID) | ORAL | 0 refills | Status: AC
Start: 2017-05-22 — End: 2017-06-01
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
## 2017-05-22 NOTE — First Provider Contact (Signed)
ED First Provider Contact Note    Initial provider evaluation performed by   ED First Provider Contact     Date/Time Event User Comments    05/22/17 1309 ED First Provider Contact Jennette Dubin Initial Face to Face Provider Contact      Patient states she thinks she has a uti. She has a urostomy bag. She states she has had the chills. Her urologist recommends coming to the ed.    Vital signs reviewed.    Orders placed:  LABS     Patient requires further evaluation.     Jennette Dubin, NP, 05/22/2017, 1:09 PM    Collaborating physician Dr Elvin So was immediately available     Jennette Dubin, NP  05/22/17 1312

## 2017-05-22 NOTE — Discharge Instructions (Signed)
You were seen in the emergency department for an UTI.     Return to the ER for worsening severe pain, fevers (>100.65F), difficulties breathing, severe nausea and vomiting, new concerning symptoms, or any other major concerns. Follow up with your primary care doctor in 1-2 days.

## 2017-05-22 NOTE — ED Notes (Signed)
Writer pulled into pt's room for pt's mother. Pt's mom diaphoretic, vomiting, with shakes. Requesting to have her BG checked. Pt informed that the staff can't check her blood sugar unless she was checked in. Pt offered a wheelchair to bring her to ambulatory triage. Pt refused. Pt given sandwich and gingerale.

## 2017-05-22 NOTE — ED Notes (Signed)
Assumed care of pt. Pt currently in stretcher with mother at bedside. Will continue to monitor.

## 2017-05-22 NOTE — ED Notes (Signed)
Pt coming to ED concerning UTI. Was referred to come here by urology for IV abx. Pt has history of UTIs. Has urostomy. Wheel chair bound, has 2 pressure ulcers.

## 2017-05-22 NOTE — ED Notes (Signed)
Pt given and signed discharge instructions. IV removed. Vital signs stable. Follow up care reviewed. Pt agreed on pharmacy location. All belongings in pt's possession and pt appropriately dressed at time of discharge.

## 2017-05-22 NOTE — ED Procedure Documentation (Addendum)
Procedures   Ultrasound - Procedure Guidance  Date/Time: 05/22/2017 3:58 PM  Performed by: Domingo Mend RONG  Authorized by: Chace Bisch A       Procedure details:     Indications: vascular access      Guidance: dynamic       Exam limitations: none    Impression:     successful procedure guidance         20g IV placed in brachial vein under ultrasound guidance. See separate provider venipuncture note for additional details.        Images were interpreted by me and archived to [facility] PACS.                Clinton Quant, MD     Roosevelt Locks, Alfonso Ellis, MD  Resident  05/23/17 [phone]  I was present and participated during the critical and key portions and was immediately available during the remainder of the procedure.    Pilot Prindle A Tamar Miano, MD       Mckinley Jewel, MD  06/07/17 1320  Ultrasound Procedure: I was present and participated during the critical and key components of the procedure, and immediately available during the remainder of the procedure.    Images: Images were reviewed and interpreted by me and I agree with the resident interpretation as documented.    Camira Geidel A Neda Willenbring, MD as of 06/14/2017 at 5:03 PM       Mckinley Jewel, MD  06/14/17 [phone]

## 2017-05-22 NOTE — ED Notes (Signed)
2 RNs attempted to get IV access with no success. Labs obtained and 1 set of blood cultures. Provider aware.

## 2017-05-22 NOTE — Progress Notes (Signed)
SW contacted by Communication Desk as pt's family had approached them with a request to meet with SW.  SW met with the family who asked for a parking voucher because they forgot the wallet at home.  SW explained how to obtain a voucher at check out.    Janit Pagan, MSW  [phone]

## 2017-05-22 NOTE — ED Notes (Signed)
Pt Hoyer lifted out of home wheelchair into stretcher. Pt incontinent and cleaned up by mother. Mother did not want assistance with changing.

## 2017-05-22 NOTE — ED Triage Notes (Signed)
C/o chills and sweats since yesterday.  Not feeling self, has urostomy bag and feels like s/sx are the same if not worse than in past.     decreased appetite.  Wheelchair bound at baseline.      Triage Note   Hollie Beach, RN

## 2017-05-23 NOTE — ED Procedure Documentation (Addendum)
Procedures   Venipuncture provider  Date/Time: 05/22/2017 3:58 PM  Performed by: Domingo Mend RONG  Authorized by: [name], Shontell Prosser A     Consent:     Consent obtained:  Verbal    Consent given by:  Patient  Indications:     Indications:  Vascular access and lab draw  Pre-procedure details:     Skin preparation:  ChloraPrep  Anesthesia (see MAR for exact dosages):     Anesthesia method:  None  Post-procedure details:     Patient tolerance of procedure:  Tolerated well, no immediate complications  Comments:      20g IV placed in brachial vein under ultrasound guidance. See separate ultrasound note for additional details.               Clinton Quant, MD     Roosevelt Locks, Alfonso Ellis, MD  Resident  05/23/17 [phone]  I was present and participated during the critical and key portions and was immediately available during the remainder of the procedure.    Nelle Sayed A Euriah Matlack, MD       Mckinley Jewel, MD  05/23/17 1451  Ultrasound Procedure: I was present and participated during the critical and key components of the procedure, and immediately available during the remainder of the procedure.    Images: Images were reviewed and interpreted by me and I agree with the resident interpretation as documented.    Ilona Colley A Jamesmichael Shadd, MD as of 06/08/2017 at 4:18 PM       Mckinley Jewel, MD  06/08/17 [phone]

## 2017-05-24 ENCOUNTER — Ambulatory Visit: Payer: Self-pay | Admitting: Physical Medicine and Rehabilitation

## 2017-05-25 ENCOUNTER — Other Ambulatory Visit: Payer: Self-pay | Admitting: Internal Medicine

## 2017-05-25 ENCOUNTER — Telehealth: Payer: Self-pay | Admitting: Internal Medicine

## 2017-05-25 DIAGNOSIS — G8254 Quadriplegia, C5-C7 incomplete: Secondary | ICD-10-CM

## 2017-05-25 DIAGNOSIS — G8250 Quadriplegia, unspecified: Secondary | ICD-10-CM

## 2017-05-25 DIAGNOSIS — N319 Neuromuscular dysfunction of bladder, unspecified: Secondary | ICD-10-CM

## 2017-05-25 MED ORDER — NON-SYSTEM MEDICATION *A*
5 refills | Status: DC
Start: 2017-05-25 — End: 2017-06-03

## 2017-05-25 MED ORDER — NON-SYSTEM MEDICATION *A*
4 refills | Status: DC
Start: 2017-05-25 — End: 2017-06-03

## 2017-05-25 MED ORDER — INCONTINENCE SUPPLY DISPOSABLE MISC *A*
5 refills | Status: DC
Start: 2017-05-25 — End: 2017-06-03

## 2017-05-25 MED ORDER — DISPOSABLE GLOVES MISC *A*
5 refills | Status: DC
Start: 2017-05-25 — End: 2017-06-03

## 2017-05-25 NOTE — Telephone Encounter (Signed)
Greg Pharmacist from ED is calling for a positive culture for a patient.     Urine culture growing 4 different organism.

## 2017-05-25 NOTE — Telephone Encounter (Signed)
Spoke with Greg/Pharmacist with the ER.  Marya Amsler states pt's urine is growing four organisms, only three of which are covered with current abx.  Marya Amsler states pt was prescribed cefpodoxime, which does not cover enterococcus.  Jonna Coup this matter would be addressed with a provider.  Marya Amsler verbalized understanding.

## 2017-05-26 NOTE — Telephone Encounter (Signed)
Attempted to contact patient, VM message left requesting a call back to schedule an ED f/u appointment.   Call back number provided

## 2017-05-27 NOTE — Telephone Encounter (Signed)
Patient has an appointment 06/02/17 with Zella Richer, NP

## 2017-05-27 NOTE — Telephone Encounter (Signed)
Attempted to contact patient, VM message left requesting a call back.   Call back number provided

## 2017-05-28 LAB — AEROBIC CULTURE

## 2017-05-28 LAB — BLOOD CULTURE
Bacterial Blood Culture: 0
Bacterial Blood Culture: 0

## 2017-05-31 ENCOUNTER — Ambulatory Visit: Payer: MEDICAID | Attending: Physical Medicine and Rehabilitation | Admitting: Physical Medicine and Rehabilitation

## 2017-05-31 ENCOUNTER — Encounter: Payer: Self-pay | Admitting: Physical Medicine and Rehabilitation

## 2017-05-31 VITALS — BP 135/93 | HR 125 | Temp 96.8°F | Wt 164.0 lb

## 2017-05-31 DIAGNOSIS — R252 Cramp and spasm: Secondary | ICD-10-CM | POA: Insufficient documentation

## 2017-05-31 DIAGNOSIS — K592 Neurogenic bowel, not elsewhere classified: Secondary | ICD-10-CM | POA: Insufficient documentation

## 2017-05-31 DIAGNOSIS — G8253 Quadriplegia, C5-C7 complete: Secondary | ICD-10-CM | POA: Insufficient documentation

## 2017-05-31 DIAGNOSIS — N319 Neuromuscular dysfunction of bladder, unspecified: Secondary | ICD-10-CM | POA: Insufficient documentation

## 2017-05-31 MED ORDER — DOCUSATE SODIUM 100 MG PO CAPS *I*
100.0000 mg | ORAL_CAPSULE | Freq: Two times a day (BID) | ORAL | 5 refills | Status: DC
Start: 2017-05-31 — End: 2017-09-17

## 2017-05-31 MED ORDER — BACLOFEN 10 MG PO TABS *I*
ORAL_TABLET | ORAL | 5 refills | Status: DC
Start: 2017-05-31 — End: 2017-06-10

## 2017-05-31 NOTE — Patient Instructions (Signed)
Take Baclofen  Half table twice a day x 3 days then  Half a tablet 3 times a day x 5 days then  Half a tablet 4 times a da x 7 days  Then increase it to 1 tablet 4 times a day   Call and report response to Dr. Priscille Loveless at [phone]   Return to see Dr. Priscille Loveless in one month.    Ian Malkin, MD
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
## 2017-06-02 ENCOUNTER — Encounter: Payer: Self-pay | Admitting: Internal Medicine

## 2017-06-02 ENCOUNTER — Ambulatory Visit: Payer: MEDICAID | Admitting: Internal Medicine

## 2017-06-02 NOTE — Progress Notes (Signed)
Patient DOB:  [id number]    Patient Address:  [address]  [place] [address]    Patient Phone:  [phone] (home) [phone] (work)    Patient Insurance:      Patient PCP:  [name], MD    Patient Appointment Calendar  [email or web address] @    Intervention Status:  DSRIP Intervention status: Successful outreach  DSRIP Patient?: Yes  Patient Barriers: coaching needed  Patient address confirmation: Yes  Patient phone confirmation: Yes  Patient insurance confirmation: Yes  Patient FCM referred?: No  Appt type: Follow up appt-PCP  Appointment center/address:: [facility] [place] [address]  Patient followup provider: [name]  [facility] Patient followup appointment date: 06/10/17  Patient followup appointment time: 0950  Patient transport plan: self  PAM completed?: No  Time Spent with Patient (min): [place]  CHW  [phone]

## 2017-06-03 ENCOUNTER — Other Ambulatory Visit: Payer: Self-pay | Admitting: Internal Medicine

## 2017-06-03 DIAGNOSIS — G8250 Quadriplegia, unspecified: Secondary | ICD-10-CM

## 2017-06-03 DIAGNOSIS — N319 Neuromuscular dysfunction of bladder, unspecified: Secondary | ICD-10-CM

## 2017-06-03 DIAGNOSIS — G8254 Quadriplegia, C5-C7 incomplete: Secondary | ICD-10-CM

## 2017-06-03 MED ORDER — DISPOSABLE GLOVES MISC *A*
5 refills | Status: DC
Start: 2017-06-03 — End: 2017-11-05

## 2017-06-03 MED ORDER — INCONTINENCE SUPPLY DISPOSABLE MISC *A*
5 refills | Status: DC
Start: 2017-06-03 — End: 2017-11-05

## 2017-06-03 MED ORDER — NON-SYSTEM MEDICATION *A*
4 refills | Status: AC
Start: 2017-06-03 — End: ?

## 2017-06-03 MED ORDER — NON-SYSTEM MEDICATION *A*
5 refills | Status: DC
Start: 2017-06-03 — End: 2017-06-03

## 2017-06-03 MED ORDER — NON-SYSTEM MEDICATION *A*
4 refills | Status: DC
Start: 2017-06-03 — End: 2017-06-03

## 2017-06-03 MED ORDER — NON-SYSTEM MEDICATION *A*
5 refills | Status: DC
Start: 2017-06-03 — End: 2017-12-09

## 2017-06-03 NOTE — Telephone Encounter (Signed)
Reordered supplies.  Will have [facility] staff FAX the scripts.    [name], MD

## 2017-06-03 NOTE — Telephone Encounter (Signed)
Patient called requesting all medical supplies to be sent to [facility] in [place].      Stated Cindy Ochoa has been trying for two weeks to request supplies through faxes and they haven't received any response.     If any questions patient can be reached at [phone]

## 2017-06-07 ENCOUNTER — Other Ambulatory Visit
Admission: RE | Admit: 2017-06-07 | Discharge: 2017-06-07 | Disposition: A | Discharge status: Home / Self Care | Payer: MEDICAID | Source: Ambulatory Visit | Attending: Surgery | Admitting: Surgery

## 2017-06-07 DIAGNOSIS — L89153 Pressure ulcer of sacral region, stage 3: Secondary | ICD-10-CM

## 2017-06-07 DIAGNOSIS — L89312 Pressure ulcer of right buttock, stage 2: Secondary | ICD-10-CM

## 2017-06-07 LAB — COMPREHENSIVE METABOLIC PANEL
ALT: 19 U/L (ref 0–35)
AST: 16 U/L (ref 0–35)
Albumin: 4.6 g/dL (ref 3.5–5.2)
Alk Phos: 69 U/L (ref 35–105)
Anion Gap: 15 (ref 7–16)
Bilirubin,Total: 0.3 mg/dL (ref 0.0–1.2)
CO2: 24 mmol/L (ref 20–28)
Calcium: 10 mg/dL (ref 8.8–10.2)
Chloride: 106 mmol/L (ref 96–108)
Creatinine: 0.39 mg/dL — ABNORMAL LOW (ref 0.51–0.95)
GFR,Black: 161 *
GFR,Caucasian: 140 *
Glucose: 89 mg/dL (ref 60–99)
Lab: 9 mg/dL (ref 6–20)
Potassium: 3.6 mmol/L (ref 3.3–5.1)
Sodium: 145 mmol/L (ref 133–145)
Total Protein: 8.2 g/dL — ABNORMAL HIGH (ref 6.3–7.7)

## 2017-06-07 LAB — CBC AND DIFFERENTIAL
Baso # K/uL: 0 10*3/uL (ref 0.0–0.1)
Basophil %: 0.3 %
Eos # K/uL: 0.1 10*3/uL (ref 0.0–0.4)
Eosinophil %: 0.9 %
Hematocrit: 44 % (ref 34–45)
Hemoglobin: 14.1 g/dL (ref 11.2–15.7)
IMM Granulocytes #: 0 10*3/uL (ref 0.0–0.1)
IMM Granulocytes: 0.3 %
Lymph # K/uL: 2.6 10*3/uL (ref 1.2–3.7)
Lymphocyte %: 36.9 %
MCH: 30 pg/cell (ref 26–32)
MCHC: 32 g/dL (ref 32–36)
MCV: 96 fL — ABNORMAL HIGH (ref 79–95)
Mono # K/uL: 0.5 10*3/uL (ref 0.2–0.9)
Monocyte %: 6.5 %
Neut # K/uL: 3.8 10*3/uL (ref 1.6–6.1)
Nucl RBC # K/uL: 0 10*3/uL (ref 0.0–0.0)
Nucl RBC %: 0 /100 WBC (ref 0.0–0.2)
Platelets: 117 10*3/uL — ABNORMAL LOW (ref 160–370)
RBC: 4.6 MIL/uL (ref 3.9–5.2)
RDW: 14.8 % — ABNORMAL HIGH (ref 11.7–14.4)
Seg Neut %: 55.1 %
WBC: 7 10*3/uL (ref 4.0–10.0)

## 2017-06-07 LAB — CRP: CRP: 17 mg/L — ABNORMAL HIGH (ref 0–10)

## 2017-06-07 LAB — PREALBUMIN: Prealbumin: 26 mg/dL (ref 20–40)

## 2017-06-10 ENCOUNTER — Ambulatory Visit: Payer: MEDICAID | Attending: Internal Medicine | Admitting: Internal Medicine

## 2017-06-10 ENCOUNTER — Encounter: Payer: Self-pay | Admitting: Internal Medicine

## 2017-06-10 VITALS — BP 106/66 | HR 96 | Temp 95.9°F | Ht 65.0 in | Wt 163.0 lb

## 2017-06-10 DIAGNOSIS — M62838 Other muscle spasm: Secondary | ICD-10-CM | POA: Insufficient documentation

## 2017-06-10 DIAGNOSIS — R300 Dysuria: Secondary | ICD-10-CM | POA: Insufficient documentation

## 2017-06-10 MED ORDER — DIAZEPAM 2 MG PO TABS *I*
2.0000 mg | ORAL_TABLET | Freq: Three times a day (TID) | ORAL | 0 refills | Status: DC | PRN
Start: 2017-06-10 — End: 2017-07-28

## 2017-06-10 MED ORDER — BACLOFEN 10 MG PO TABS *I*
ORAL_TABLET | ORAL | 5 refills | Status: DC
Start: 2017-06-10 — End: 2017-07-28

## 2017-06-10 NOTE — Progress Notes (Signed)
Reason For Visit: The primary encounter diagnosis was Dysuria. A diagnosis of Muscle spasm was also pertinent to this visit.      HPI:  Cindy Ochoa is 31 y.o. year old female with     She recent ED visit for spasticity   She was given valium that she feels is working  She is out of valium and baclofen  Recently restart on baclofen by PMR but has not picked up the rx  She was given rx valium by Dr. Hassell Done 02/23/2017 90 lasted 4 months     Patient was seen by Dr.Inzerilo to discuss medical marijuana for spasticity  Patient reports it was $400 per month and to expensive  She will continue to bite HISTORY    Patient is previously trached  She is paraplegic  Patient reports she feels short of breath at at bedtime and wonders if she needs oxygen.  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  She reports hard to tell if she has shaking chills  She has no fever today  Urine is dark but no odor   No one on the urine can straight cath her urostomy       Social :here with mother     Medications:     Current Outpatient Prescriptions   Medication Sig    diazepam (VALIUM) 2 MG tablet Take 1 tablet (2 mg total) by mouth 3 times daily as needed   Max daily dose: 3 tablets for muscle spasm    incontinence supply disposable Large pull ups - use up to 5 x daily  Dx N39.46    disposable gloves 1 box Dynarex PF Vinyl Gloves    Non-System Medication Urostomy drainage bags 2000 cc change as needed. Dx N39.46 and  G82.54    Non-System Medication Easy Tip Leg Bags 1000mg  - diagnosis G82.53 N39.46    baclofen (LIORESAL) 10 MG tablet Take half a tablet twice a day for 3 days then three times a day for 5 days then 4 times a day for 7 days.then 1 tablet 4 times a day    docusate sodium  (COLACE) 100 MG capsule Take 1 capsule (100 mg total) by mouth 2 times daily    BISAC-EVAC 10 MG suppository UNWRAP AND INSERT 1-2 SUPPOSITORIES RECTALLY EVERY OTHER DAY    mirtazapine (REMERON) 7.5 MG tablet Take 1 tablet (7.5 mg total) by mouth nightly    fesoterodine (TOVIAZ) 4 MG 24 hr tablet Take 1 tablet (4 mg total) by mouth daily    disposable underpads 30"x36" (CHUX) Use 6 times daily and PRN. Dx N39.42  Incontinence without sensory awareness    disposable gloves 2 boxes Disposable Medium size gloves N39.46    MAPAP 500 MG tablet take 1 tablet by mouth every 4 to 6 hours if needed for pain    etonogestrel (NEXPLANON) 68 MG IMPL Inject 68 mg into the skin once   Placed 10/20/16    senna-docusate (PERICOLACE) 8.6-50 MG per tablet Take 2 tablets by mouth daily as needed for Constipation    generic DME Use as directed. Hospital bed with Low air loss mattress. Duration of use: 1 year.  psyllium (METAMUCIL) 95 % packet Take 1 packet by mouth daily    hospital bed Use as directed    Non-System Medication Gel overlay mattress for hospital bed - diagnosis G82.54, L89.93, L89.159    Non-System Medication Hospital Bed with Air Mattress - Diagnosis G82.54, L89.93,L89.159  Medicaid [id number]    generic DME Left fifth digit finger splint for sprain.    albuterol HFA 108 (90 BASE) MCG/ACT inhaler Inhale 1-2 puffs into the lungs every 6 hours as needed   Shake well before each use.    Spacer/Aero-Holding Chambers (EASIVENT) spacer Use as instructed    Non-System Medication Medication/Supply: [name] Lift with Sling  Directions for Use: use prn    Non-System Medication Medication/Supply: Replacement Legrest assy parts for Power Wheelchair  Directions for Use: repair power wheelchair    generic DME Use as directed. Hoyer lift. Duration of use: lifetime. ICD:10-G82.53    polyethylene glycol (GLYCOLAX) powder Take 17 g by mouth daily   Mix in 8 oz water or juice and drink.    Non-System Medication Shower  Chair - Quadriparesis G82.54    Ostomy Supplies (HOLLISTER REPLACEMENT FILTERS) [place] (order # [id number]).    ARTIFICIAL TEARS 1.4 % ophthalmic solution PLACE 1 DROP INTO BOTH EYES FOUR TIMES DAILY    Non-System Medication PRI AND SCREEN  DIAGNOSIS 344.04 Quadriparesis    white petrolatum-mineral oil (GENTEAL PM) 85-15 % ophthalmic ointment Place into both eyes nightly    Skin Protectants, Misc. OINT Apply 1 Tube topically 2 times daily      Non-System Medication One Hoyer Lift  Diagnosis:  Quadriplegic ICD-9 344.04     Current Facility-Administered Medications   Medication    lidocaine (XYLOCAINE,REGENECARE) 2 % jelly       Medication list reconciled this visit    Allergies:     Allergies   Allergen Reactions    Nitrofurantoin Nausea And Vomiting    Vancomycin Hives     hives 2006 but tolerated Rx in 2010  pt states she had vancomycin in 04/2012.  Feels this is not true allergy  as she has received it recently - had no reaction     Heparin Other (See Comments)     Thrombocytopenia;        Social history      Social History   Substance Use Topics    Smoking status: Current Every Day Smoker     Packs/day: 0.25     Years: 3.00     Types: Cigarettes     Start date: 12/09/2011     Last attempt to quit: 07/30/2016    Smokeless tobacco: Former Systems developer     Quit date: 04/29/2005      Comment: 2 cigs a day    Alcohol use No       Review of Systems     Review of Systems   Constitutional: Negative for chills and fever.   Respiratory: Negative for cough, shortness of breath and wheezing.    Cardiovascular: Negative for chest pain, palpitations and leg swelling.   Gastrointestinal: Negative for abdominal pain.   Genitourinary:        Urostomy     Musculoskeletal: Negative for back pain, falls and myalgias.   Skin: Negative for rash.          Physical Exam:     Physical Exam   Constitutional:   Sitting in motorized wheel chair sweating     Cardiovascular: Normal rate and regular rhythm.  Pulmonary/Chest: Effort  normal and breath sounds normal.   Abdominal: Soft. Bowel sounds are normal.   Musculoskeletal:   spascity is quit bad today    Lymphadenopathy:     She has no cervical adenopathy.        Vitals:    06/10/17 1009   BP: 106/66   Pulse: 96   Temp: 35.5 C (95.9 F)   TempSrc: Temporal   Weight: 73.9 kg (163 lb)   Height: 1.651 m (5\' 5" )     Wt Readings from Last 3 Encounters:   06/10/17 73.9 kg (163 lb)   05/31/17 74.4 kg (164 lb)   05/22/17 74.4 kg (164 lb)     BP Readings from Last 3 Encounters:   06/10/17 106/66   05/31/17 (!) 135/93   05/22/17 100/57         ASSESSMENT/PLAN:     Dysuria  -     CBC and differential; Future         If elevated will send home nurse to get urine culture or ED         She declined ED today          Follow up with urology     Muscle spasm             Restart baclofen             Stop marijuana   -     diazepam (VALIUM) 2 MG tablet; Take 1 tablet (2 mg total) by mouth 3 times daily as needed   Max daily dose: 3 tablets for muscle spasm. Only for really bad, days, try to keep her out of the ED        Discuss urine tox and compliance with appointments                     Follow up: one month         Lavone Orn,  Nurse Practitioner  [facility]

## 2017-06-14 ENCOUNTER — Telehealth: Payer: Self-pay

## 2017-06-14 ENCOUNTER — Other Ambulatory Visit
Admission: RE | Admit: 2017-06-14 | Discharge: 2017-06-14 | Disposition: A | Discharge status: Home / Self Care | Payer: MEDICAID | Source: Ambulatory Visit | Attending: Internal Medicine | Admitting: Internal Medicine

## 2017-06-14 DIAGNOSIS — R300 Dysuria: Secondary | ICD-10-CM

## 2017-06-14 LAB — CBC AND DIFFERENTIAL
Baso # K/uL: 0 10*3/uL (ref 0.0–0.1)
Basophil %: 0.2 %
Eos # K/uL: 0.1 10*3/uL (ref 0.0–0.4)
Eosinophil %: 1.1 %
Hematocrit: 42 % (ref 34–45)
Hemoglobin: 13.3 g/dL (ref 11.2–15.7)
IMM Granulocytes #: 0 10*3/uL (ref 0.0–0.1)
IMM Granulocytes: 0.2 %
Lymph # K/uL: 2.6 10*3/uL (ref 1.2–3.7)
Lymphocyte %: 46.6 %
MCH: 30 pg/cell (ref 26–32)
MCHC: 32 g/dL (ref 32–36)
MCV: 96 fL — ABNORMAL HIGH (ref 79–95)
Mono # K/uL: 0.4 10*3/uL (ref 0.2–0.9)
Monocyte %: 6.7 %
Neut # K/uL: 2.6 10*3/uL (ref 1.6–6.1)
Nucl RBC # K/uL: 0 10*3/uL (ref 0.0–0.0)
Nucl RBC %: 0 /100 WBC (ref 0.0–0.2)
Platelets: 104 10*3/uL — ABNORMAL LOW (ref 160–370)
RBC: 4.4 MIL/uL (ref 3.9–5.2)
RDW: 14.7 % — ABNORMAL HIGH (ref 11.7–14.4)
Seg Neut %: 45.2 %
WBC: 5.6 10*3/uL (ref 4.0–10.0)

## 2017-06-14 NOTE — Telephone Encounter (Signed)
Spoke to the pt.  Informed the pt that CBC is normal.  Informed the pt that there is no signs of infection.  Pt stated verbal understanding of this information.

## 2017-06-14 NOTE — Telephone Encounter (Signed)
-----   Message from Sherlon Handing, NP sent at 06/14/2017 12:57 PM EDT -----  Please call  Cbc is normal  No signs of infection

## 2017-07-05 MED ORDER — HYDROFERA BLUE 4"X4" EX PADS
MEDICATED_PAD | CUTANEOUS | 6 refills | Status: DC
Start: 2017-07-05 — End: 2017-11-15

## 2017-07-05 MED ORDER — MEDIPORE SURGICAL 2"X10YD TAPE
MEDICATED_TAPE | 6 refills | Status: DC
Start: 2017-07-05 — End: 2017-11-15

## 2017-07-05 MED ORDER — ABDOMINAL PAD 8"X10" PADS
1.0000 | MEDICATED_PAD | Freq: Every day | 6 refills | Status: DC
Start: 2017-07-05 — End: 2017-11-15

## 2017-07-05 NOTE — Addendum Note (Signed)
Addended by: Zollie Beckers on: 07/05/2017 08:40 AM     Modules accepted: Orders

## 2017-07-12 ENCOUNTER — Ambulatory Visit: Payer: MEDICAID | Admitting: Internal Medicine

## 2017-07-19 ENCOUNTER — Telehealth: Payer: Self-pay | Admitting: Internal Medicine

## 2017-07-19 NOTE — Telephone Encounter (Signed)
Patient called asking too speak with Bradley Ferris. She states that she needs help with doing paper word for a nurses aid

## 2017-07-20 ENCOUNTER — Encounter: Payer: Self-pay | Admitting: Physical Medicine and Rehabilitation

## 2017-07-20 ENCOUNTER — Ambulatory Visit: Payer: MEDICAID | Attending: Physical Medicine and Rehabilitation | Admitting: Physical Medicine and Rehabilitation

## 2017-07-20 VITALS — BP 113/77 | HR 128 | Temp 97.7°F

## 2017-07-20 DIAGNOSIS — R252 Cramp and spasm: Secondary | ICD-10-CM | POA: Insufficient documentation

## 2017-07-20 DIAGNOSIS — K592 Neurogenic bowel, not elsewhere classified: Secondary | ICD-10-CM | POA: Insufficient documentation

## 2017-07-20 DIAGNOSIS — G8250 Quadriplegia, unspecified: Secondary | ICD-10-CM

## 2017-07-20 DIAGNOSIS — K5900 Constipation, unspecified: Secondary | ICD-10-CM | POA: Insufficient documentation

## 2017-07-20 DIAGNOSIS — N319 Neuromuscular dysfunction of bladder, unspecified: Secondary | ICD-10-CM

## 2017-07-20 MED ORDER — DANTROLENE SODIUM 100 MG PO CAPS *I*
100.0000 mg | ORAL_CAPSULE | Freq: Two times a day (BID) | ORAL | 4 refills | Status: DC
Start: 2017-07-20 — End: 2017-09-23

## 2017-07-20 NOTE — Patient Instructions (Addendum)
-   Dexa scan ordered, they will call you  - Start Dantrium for spasms 100mg  tablets, take one tablet twice a day  - Check labs in about a week for liver function  - Increase Colace to one pill three times a day with meals  - Try gas-x   - Follow up in one month

## 2017-07-20 NOTE — Telephone Encounter (Signed)
Writer attempted to reach pt at listed phone number but pt's phone is not currently in service and there is not an alternate phone number listed.     Plan:  SW to continue to try and reach pt, will send an outreach letter if phone remains out of service.
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
## 2017-07-22 ENCOUNTER — Telehealth: Payer: Self-pay | Admitting: Internal Medicine

## 2017-07-22 NOTE — Telephone Encounter (Signed)
Cindy Ochoa states she dropped off paperwork this week to have her Nurses Aide services continued.  The deadline is 7/31 to get the paperwork in or her services will be discontinued.      Please contact patient to let her know if the paperwork has been completed.    Patient's contact number is [phone] (H)

## 2017-07-23 NOTE — Telephone Encounter (Signed)
Patient last saw [facility] 06/10/17.

## 2017-07-27 NOTE — Telephone Encounter (Signed)
Writer had call from Renford Dills who said it would be okay to fax CDPAS form 07/28/17.  Writer placed CDPAS in Dr. Nada Libman folder for 07/28/17 and wrote urgent on it.  When complete, Lelon Frohlich advised that form needs to be faxed to [place] at [phone].

## 2017-07-27 NOTE — Telephone Encounter (Signed)
Writer called [facility] at [phone] who directed Probation officer to [employer].  Writer called [place] at [phone] who asked for patient's BCO number but patient has straight medicaid.  Writer called and asked Renford Dills who said she would reach out to [facility].

## 2017-07-27 NOTE — Telephone Encounter (Signed)
Clinical care management:  [facility] service unit and [facility] contacted . Per [facility], patient has changed to [employer] for managed long term medicaid.  Per [facility] , patient not part of their plan but may be as of 07/28/17. Suggesting call to aide agency listed in erecord.   [facility] contacted and states they were  Active in the past but patient has another aide agency at this time for her CDPAS aide service.  Patient contacted and states she has chosen [facility] Managed long term medicaid and will start 07/28/17. Patient states she has [place] presently with her mother providing care with fiscal intermediary agency [facility] [employer] [facility].   Patient advised that paperwork for aide service must be connected with an office visit within 30 days. Patient agreeable to contact [employer] [facility] to advise of plan for form with office visit tomorrow.    Patient plans to attend [facility] appt tomorrow and will need to have completion of CDPAS form done at that time and faxed to [place].  Plan reviewed with [facility] who will assist with sending to [place].

## 2017-07-27 NOTE — Telephone Encounter (Signed)
Patient has appointment with CCP Dr. Iva Boop on 07/28/17 at 4:30 PM.  Patient has straight medicaid.

## 2017-07-27 NOTE — Telephone Encounter (Signed)
Patient is requesting a call from [facility] or the social worker    Patient stated her aid service expires today    Patient is requesting a call back    Patient can be reached at [phone]

## 2017-07-28 ENCOUNTER — Telehealth: Payer: Self-pay

## 2017-07-28 ENCOUNTER — Ambulatory Visit
Payer: Medicaid Other | Attending: Student in an Organized Health Care Education/Training Program | Admitting: Student in an Organized Health Care Education/Training Program

## 2017-07-28 ENCOUNTER — Encounter: Payer: Self-pay | Admitting: Gastroenterology

## 2017-07-28 ENCOUNTER — Encounter: Payer: Self-pay | Admitting: Student in an Organized Health Care Education/Training Program

## 2017-07-28 VITALS — BP 130/82 | HR 90 | Temp 98.7°F | Ht 65.0 in | Wt 163.0 lb

## 2017-07-28 DIAGNOSIS — G8254 Quadriplegia, C5-C7 incomplete: Secondary | ICD-10-CM

## 2017-07-28 NOTE — Telephone Encounter (Signed)
Patient frustrated with change of insurance and transition plan unsure regarding aide service.  Patient is eligible for health home care management. Health home care management program discussed with patient for additional support and resources in the community. Patient agreeable to program and verbal  consent obtained by writer on _8/1/18____           Medicaid number: [id number]    Eligibility category:    Two chronic diseases_Quadriparesis, depression, Neurogenic bladder and bowel , pressure ulcer stage 3, osteoporosis, nephrolithiasis      Risk Factors: X and bold type for all that apply     [x]   Probable risk for adverse event- death, disability, inpatient or SNF admission       [x]   Lack of or inadequate social [place] /housing support- has changed to managed medicaid - may need assist with transition for aide services     []   Lack of or inadequate connectivity with health care system     []   Non adherence to treatments, appointments, medications or difficulty managing medication     []  Recent release from incarceration     []   Recent release from psychiatric hospitalization     [x]   Deficits in ADLS      []   Learning or cognition issues    Will forward to [facility] embedded health home care manager for further review/assignment.

## 2017-07-28 NOTE — Telephone Encounter (Addendum)
Clinical care management:  Contacted by [facility] case manager Amy as follow up to discussion regarding managed care and CDPAS form for aide service to continue.  Per Amy, patient not enrolled with [facility].as of today. Suggesting call to Main enrollment for medicaid managed care.  PHone contact to Medicaid main enrollment - unable to provide information to person other than the patient.  Phone contact attempted to patient and message left with request to call Medicaid enrollment or her caseworker at [place] to check status of insurance and Microbiologist back.  Phone contact to Medicaid home care service unit and states [facility] is listed as managed medicaid as of today. Suggesting call to case worker [employer] [phone].  Phone contact attempted to case worker and unable to leave a message.   Phone contact to Helen who will attempt to check on insurance again this am to see if updated.      Call back from patient who states she has checked with [facility] through Medicaid enrollment and has had 3 way conversation and now covered by Managed [facility] insurance.  Will plan for CDPAS form to be sent to [facility] after visit today.   Patient states d/t changed to managed medicaid today has been told she will not have aide service yet. Patient concerned as mother helping but has own medical issues.   Discussion with SIM SW - suggesting call to [facility] to request transition plan for aide service. Amy rep for [place] contacted and will investigate and call writer back.   Plan for CDPAS form completion reviewed with Dr Iva Boop.

## 2017-07-28 NOTE — Telephone Encounter (Signed)
Clinical care management:  Contacted by Mariam Dollar rep from [facility] for [facility] choice option / [employer].  Patient covered by [facility] Choice option as of today. Amy to contact [place] in the home to approve aide service coverage starting today as transition plan.  Amy to contact patient to advise .

## 2017-07-28 NOTE — Progress Notes (Signed)
[facility] Progress Note    Subjective:       Cindy Ochoa is a 31 y.o. year old female with PMHx of quadriparesis, autonomic dysfunctionm, chronic pressure ulcers, and neurogenic bladder w/ urostomy, presenting today for need of aide services.    Patient reports doing well  Here because she switched insurances and her aide services ended yesterday  Needs form filled to obtain new aide services under new insurance  Reports she is doing fine at home for now  Lives at home with 71 yo son    [place] if she is developing UTI but not obvious to her  Eating only once daily and feels warm  Denies emesis or fever at home   Has a urostomy bag    Has neurologist who discontinued baclofen, valium     Medications reviewed and reconciled.   Current Outpatient Prescriptions   Medication Sig    dantrolene (DANTRIUM) 100 MG capsule Take 1 capsule (100 mg total) by mouth 2 times daily    Wound Dressings (HYDROFERA BLUE 4"X4") PADS Cut and moisten with saline as directed and pack in to buttock wound daily    Gauze Pads & Dressings (ABDOMINAL PAD) 8"X10" PADS By 1 each no specified route daily   Cover buttock wounds 2x daily    Adhesive Tape (MEDIPORE SURGICAL 2"X10YD) TAPE Secure the 2 buttocks  wound dressings 2x day    incontinence supply disposable Large pull ups - use up to 5 x daily  Dx N39.46    disposable gloves 1 box Dynarex PF Vinyl Gloves    Non-System Medication Urostomy drainage bags 2000 cc change as needed. Dx N39.46 and  G82.54    Non-System Medication Easy Tip Leg Bags 1000mg  - diagnosis G82.53 N39.46    docusate sodium (COLACE) 100 MG capsule Take 1 capsule (100 mg total) by mouth 2 times daily    BISAC-EVAC 10 MG suppository UNWRAP AND INSERT 1-2 SUPPOSITORIES RECTALLY EVERY OTHER DAY    disposable underpads 30"x36" (CHUX) Use 6 times daily and PRN. Dx N39.42  Incontinence without sensory awareness    disposable gloves 2 boxes Disposable Medium size gloves N39.46    etonogestrel  (NEXPLANON) 68 MG IMPL Inject 68 mg into the skin once   Placed 10/20/16    senna-docusate (PERICOLACE) 8.6-50 MG per tablet Take 2 tablets by mouth daily as needed for Constipation    generic DME Use as directed. Hospital bed with Low air loss mattress. Duration of use: 1 year.    psyllium (METAMUCIL) 95 % packet Take 1 packet by mouth daily    hospital bed Use as directed    Non-System Medication Gel overlay mattress for hospital bed - diagnosis G82.54, L89.93, L89.159    Non-System Medication Hospital Bed with Air Mattress - Diagnosis G82.54, L89.93,L89.159  Medicaid [id number]    generic DME Left fifth digit finger splint for sprain.    Spacer/Aero-Holding Chambers (EASIVENT) spacer Use as instructed    Non-System Medication Medication/Supply: [name] Lift with Sling  Directions for Use: use prn    Non-System Medication Medication/Supply: Replacement Legrest assy parts for Power Wheelchair  Directions for Use: repair power wheelchair    generic DME Use as directed. Hoyer lift. Duration of use: lifetime. ICD:10-G82.53    polyethylene glycol (GLYCOLAX) powder Take 17 g by mouth daily   Mix in 8 oz water or juice and drink.    Non-System Medication Shower Chair - Quadriparesis G82.54    Ostomy Supplies ([place]) [place]  Hollister Adapter (order # [id number]).    Non-System Medication PRI AND SCREEN  DIAGNOSIS 344.04 Quadriparesis    white petrolatum-mineral oil (GENTEAL PM) 85-15 % ophthalmic ointment Place into both eyes nightly    Skin Protectants, Misc. OINT Apply 1 Tube topically 2 times daily      Non-System Medication One Hoyer Lift  Diagnosis:  Quadriplegic ICD-9 344.04    MAPAP 500 MG tablet take 1 tablet by mouth every 4 to 6 hours if needed for pain    albuterol HFA 108 (90 BASE) MCG/ACT inhaler Inhale 1-2 puffs into the lungs every 6 hours as needed   Shake well before each use.         Physical Exam:     Vitals:    07/28/17 1637   BP: 130/82   Pulse: 90   Temp: 37.1 C (98.7  F)   TempSrc: Temporal   Weight: 73.9 kg (163 lb)   Height: 1.651 m (5\' 5" )     Wt Readings from Last 3 Encounters:   07/28/17 73.9 kg (163 lb)   06/10/17 73.9 kg (163 lb)   05/31/17 74.4 kg (164 lb)     BP Readings from Last 3 Encounters:   07/28/17 130/82   07/20/17 113/77   06/10/17 106/66       General: Pleasant, In no apparent distress, wheelchair bound  Pulmonary: CTA B/L. No wheezes, rhonchi, or rales.  Cardiovascular: RRR. No murmurs, rubs, or gallops.   Abdominal: Soft. Non-distended. Non-tender. Bowel sounds present.  Extremities: No peripheral edema.  Neuro: CN II - XII grossly intact. Intermittent myoclonic jerking of bilateral LE. Some movement of bilateral UE      Assessment and Plan:     1. Quadriplegia, C5-C7 incomplete  - Stable condition  - Complete aides service form today to be faxed first thing  - Updated medication list  - Discontinued valium, baclofen for spasms    2. Concern for UTI  - No tachycardia, hypotension, or fever   - Patient typically has anorexia, diaphoresis with UTI  - Has hx of multiple UTI's   - Hold off abx, advised patient to call if feels worse, patient agrees with this plan      Health Maintenance:  - Pap Smear - due 09/2020  - Immunizations - up to date  - Hgb A1c - none      Follow up in 6 months.       Dyke Brackett, MD  Internal Medicine PGY-3

## 2017-07-29 NOTE — Telephone Encounter (Signed)
Clinical care management:  Phone contact to patient to advise that [employer] has approved aide service and [employer] has called [facility] in the home.   Patient provided with contact information for  case manager Amy from [facility] in case of questions.   Patient advised that CDPAS form will be faxed to [place] today.  Erecord reviewed and patient's appt for 08/02/17 cancelled as had moved appt up . Scheduled for appt in January when provider available for follow up.

## 2017-07-29 NOTE — Telephone Encounter (Signed)
Form completed and faxed to [facility] at [phone] 10:12 AM 07/29/2017

## 2017-08-02 ENCOUNTER — Ambulatory Visit: Payer: MEDICAID | Admitting: Internal Medicine

## 2017-08-03 ENCOUNTER — Encounter: Payer: Self-pay | Admitting: Surgery

## 2017-08-03 ENCOUNTER — Ambulatory Visit: Payer: Medicaid Other | Attending: Internal Medicine | Admitting: Surgery

## 2017-08-03 ENCOUNTER — Other Ambulatory Visit: Payer: Self-pay | Admitting: Student in an Organized Health Care Education/Training Program

## 2017-08-03 VITALS — BP 108/66 | HR 93 | Temp 96.6°F | Resp 16 | Ht 65.0 in | Wt 165.0 lb

## 2017-08-03 DIAGNOSIS — R32 Unspecified urinary incontinence: Secondary | ICD-10-CM

## 2017-08-03 DIAGNOSIS — L89153 Pressure ulcer of sacral region, stage 3: Secondary | ICD-10-CM | POA: Insufficient documentation

## 2017-08-03 DIAGNOSIS — L89313 Pressure ulcer of right buttock, stage 3: Secondary | ICD-10-CM

## 2017-08-03 MED ORDER — LIDOCAINE HCL 2 % EX JELLY WRAPPED *I*
CUTANEOUS | Status: AC | PRN
Start: 2017-08-03 — End: 2017-10-02

## 2017-08-03 NOTE — Patient Instructions (Addendum)
Please follow these instructions about how to care for your wound.    Your supplies have been ordered from [facility] and should arrive to your home in 3 business days.    Wound Location Dressing Orders Frequency of Dressing Change   Location: Sacral                                      Location: Right ischium Calmoseptine to peri wound  Use cavilon wipe on the skin anywhere where the adhesive may touch  Cut square of hydrafera blue into 3 strips  Wet one strip before each dressing change (save the other 2 into a baggie)  Ring out excess water  Pack wound with damp hydrafera blue, leave a small tail hanging out  Cover with ABD pad  Tape  *NEW* You may use just Abd pad and tape on the days that the wound is drier*      Calmoseptine around wound  abd pad to cover    Vitamin a+d to dry areas or vaseline     [x] When the hydrafera blue changes white (instead of blue), you should change the dressing  You can change the abd pad as often as necessary                            Daily     Cleanse Wound(s) with:    [x] Soap &Water with dressing changes, keep dressing dry in between       [x] Leg Elevation: Elevate leg(s) to level of heart   or above for 30 minutes 3 times per day.  [x] Right   Leg [x] Left   Leg       [x] Assistive Device(s): [x] Wheelchair    [x] Pressure Reduction: [x] Wheelchair Cushion   [x] Specialty Bed [x] Reposition every 2 hours   [x] Limit time up in your chair to 2 hours or less at a time      [x] General: [x] Stop / Decrease Smoking        [] Discharge from [facility] Discharge Instructions    Following the instructions below will give you the best opportunity for wound healing.    Wound Care Instructions (If changing own dressing):  Gather all supplies you will need to change your dressing.  Before changing your dressing, wash your hands for at least 15  seconds with warm soapy water.  Rinse off all soap, then dry with a towel.  Remove the old dressing. Wash hands again before applying the new dressing.    If you experience any of the following during our business hours of 8 AM - 4:30 PM, Monday - Friday, please call the [facility] at [phone]:    ?Increase in pain ?Temperature over 101F ?Drainage with a foul odor ?Bleeding ?Increase in swelling ?Increase in drainage from your wound ?Need for compression bandage changes (slippage, breakthrough drainage)    Please contact your primary care physician or proceed to the nearest emergency room if you experience any of the above after our business hours.     Please note your appointment(s) above - if you are unable to keep, kindly give 24 hours' notice. Thank You!
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
## 2017-08-05 ENCOUNTER — Ambulatory Visit: Payer: Medicaid Other | Admitting: Orthopedic Surgery

## 2017-08-05 ENCOUNTER — Encounter: Payer: Self-pay | Admitting: Orthopedic Surgery

## 2017-08-05 ENCOUNTER — Ambulatory Visit
Admission: RE | Admit: 2017-08-05 | Discharge: 2017-08-05 | Disposition: A | Discharge status: Home / Self Care | Payer: Medicaid Other | Source: Ambulatory Visit | Attending: Orthopedic Surgery | Admitting: Orthopedic Surgery

## 2017-08-05 ENCOUNTER — Telehealth: Payer: Self-pay | Admitting: Internal Medicine

## 2017-08-05 ENCOUNTER — Other Ambulatory Visit: Payer: Self-pay | Admitting: Orthopedic Surgery

## 2017-08-05 VITALS — BP 111/65 | HR 95 | Ht 65.0 in | Wt 165.0 lb

## 2017-08-05 DIAGNOSIS — G8254 Quadriplegia, C5-C7 incomplete: Secondary | ICD-10-CM

## 2017-08-05 DIAGNOSIS — Z981 Arthrodesis status: Secondary | ICD-10-CM | POA: Insufficient documentation

## 2017-08-05 DIAGNOSIS — M47812 Spondylosis without myelopathy or radiculopathy, cervical region: Secondary | ICD-10-CM

## 2017-08-05 NOTE — Telephone Encounter (Signed)
Patient stopped by clinic to drop off "5 Order for Personal Care.." paperwork. She stated she would like a call at [phone] once the paperwork is completed so she can pick it up.

## 2017-08-05 NOTE — Telephone Encounter (Signed)
Message noted- will contact patient once paperwork is completed.
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
## 2017-08-09 NOTE — Telephone Encounter (Signed)
Patient calling to check the statu of her paperwork (see encounter below )    Writer informed patient of Loma Sousa previous encounter     Please contact patient with a update    Patient can be reached at [phone]

## 2017-08-09 NOTE — Telephone Encounter (Signed)
Form was given to Dr. Casper Harrison to further advise

## 2017-08-10 ENCOUNTER — Encounter: Payer: Self-pay | Admitting: Gastroenterology

## 2017-08-10 ENCOUNTER — Ambulatory Visit: Payer: MEDICAID | Admitting: Physical Medicine and Rehabilitation

## 2017-08-11 NOTE — Telephone Encounter (Addendum)
Form requires attending to co sign.  Placed in attending folder 08/11/2017 for co-signature then form will be faxed in and patient can pick up a copy.  No originals are given to patient

## 2017-08-12 ENCOUNTER — Encounter: Payer: MEDICAID | Admitting: Surgery

## 2017-08-12 ENCOUNTER — Telehealth: Payer: Self-pay | Admitting: Internal Medicine

## 2017-08-12 NOTE — Telephone Encounter (Signed)
Form was in need of attending co-signature which is now complete as of 08/12/2017     Form and med list faxed to [employer] this morning

## 2017-08-12 NOTE — Telephone Encounter (Signed)
Kim with [employer] called to inform Dr. patient has been opened to case management.     Caller is requesting a treatment plan be faxed over to her at [phone].     Maudie Mercury stated she will she will also send over a care plan once available.    Caller can be reached at [phone]

## 2017-08-16 ENCOUNTER — Other Ambulatory Visit: Payer: Self-pay

## 2017-08-16 ENCOUNTER — Ambulatory Visit: Payer: MEDICAID | Admitting: Urology

## 2017-08-16 NOTE — Progress Notes (Signed)
Clinical care management:  Patient at [facility] office requesting CDPAS and [place] paperwork.   Patient informed that CDPAS form already sent to [employer] per her request. Patient states she has met with [employer] and is working on change to [place] for managed long term medicaid.  States she has also dropped off form for [place].   Review with team [facility] [place] Nickel who reports that I circle form has been faxed to [place] [facility] already per her request and copy of form provided to patient. Patient to Secondary school teacher when I circle in place so that CDPAS form can also  be sent to [place] .

## 2017-08-16 NOTE — Telephone Encounter (Signed)
Patient arrived at [facility] 08/16/2017 requesting original form.  Writer provided copy to nurse care manager advising that we are not to give out original forms.  Apparently patient desires to change to [employer] [place].      Writer will hold original form until notified it should be sent in to [employer] [facility] per conversation with Renford Dills

## 2017-08-19 ENCOUNTER — Encounter: Payer: MEDICAID | Admitting: Surgery

## 2017-09-13 ENCOUNTER — Ambulatory Visit: Payer: Medicaid Other | Admitting: Internal Medicine

## 2017-09-14 ENCOUNTER — Ambulatory Visit: Payer: Medicaid Other | Admitting: Physical Medicine and Rehabilitation

## 2017-09-17 ENCOUNTER — Ambulatory Visit: Payer: Medicaid Other | Attending: Internal Medicine | Admitting: Internal Medicine

## 2017-09-17 ENCOUNTER — Encounter: Payer: Self-pay | Admitting: Internal Medicine

## 2017-09-17 VITALS — BP 106/76 | HR 68 | Temp 95.0°F | Wt 164.0 lb

## 2017-09-17 DIAGNOSIS — G8254 Quadriplegia, C5-C7 incomplete: Secondary | ICD-10-CM

## 2017-09-17 DIAGNOSIS — N319 Neuromuscular dysfunction of bladder, unspecified: Secondary | ICD-10-CM

## 2017-09-17 DIAGNOSIS — K592 Neurogenic bowel, not elsewhere classified: Secondary | ICD-10-CM

## 2017-09-17 MED ORDER — SENNOSIDES-DOCUSATE SODIUM 8.6-50 MG PO TABS *I*
2.0000 | ORAL_TABLET | Freq: Every day | ORAL | 5 refills | Status: DC | PRN
Start: 2017-09-17 — End: 2018-08-30

## 2017-09-17 MED ORDER — BISACODYL 10 MG RE SUPP *I*
10.0000 mg | Freq: Every day | RECTAL | 3 refills | Status: DC | PRN
Start: 2017-09-17 — End: 2018-08-30

## 2017-09-17 MED ORDER — ACETAMINOPHEN 500 MG PO TABS *I*
500.0000 mg | ORAL_TABLET | ORAL | 5 refills | Status: DC | PRN
Start: 2017-09-17 — End: 2020-11-27

## 2017-09-17 MED ORDER — ALBUTEROL SULFATE HFA 108 (90 BASE) MCG/ACT IN AERS *I*
1.0000 | INHALATION_SPRAY | Freq: Four times a day (QID) | RESPIRATORY_TRACT | 0 refills | Status: DC | PRN
Start: 2017-09-17 — End: 2020-03-19

## 2017-09-17 MED ORDER — DOCUSATE SODIUM 100 MG PO CAPS *I*
100.0000 mg | ORAL_CAPSULE | Freq: Two times a day (BID) | ORAL | 5 refills | Status: DC
Start: 2017-09-17 — End: 2018-08-30

## 2017-09-20 ENCOUNTER — Ambulatory Visit: Payer: Medicaid Other | Admitting: Urology

## 2017-09-21 ENCOUNTER — Telehealth: Payer: Self-pay

## 2017-09-21 NOTE — Progress Notes (Signed)
Reason For Visit: The primary encounter diagnosis was Quadriplegia, C5-C7 incomplete. Diagnoses of Neurogenic bladder disorder and Neurogenic bowel were also pertinent to this visit.      HPI:  Cindy Ochoa is 31 y.o. year old female with quadriplegia, neurogenic bladder, neurogenic bowel, sacral decubiti, mood disorder    Patient is here to for follow-up    Patient reports she lost her aid last spring and her mother has been helping  She will need CDPAS paper in the future. As she want to restart her aids again       Quadriplegic  Patient is total care  She is in a motorized wheelchair and she is able to operate   All food needs to be prepped she can feed herself  She has no feeling in her lower extremities    Neurogenic bladder  Patient has suprapubic catheter  Significant reduction in UTIs    Neurogenic bowel  Peri-Colace 2 tablets as needed  Suppositories every other day  Bowel movements are soft and brown    Patient reports her mood is good    Currently being seen by the wound care clinic for sacral decubiti  She is a [employer] fast streamer     Social : Currently not smoking time    Medications:     Current Outpatient Prescriptions   Medication Sig    docusate sodium (COLACE) 100 MG capsule Take 1 capsule (100 mg total) by mouth 2 times daily    bisacodyl (BISAC-EVAC) 10 MG suppository Place 1 suppository (10 mg total) rectally daily as needed    acetaminophen (MAPAP) 500 mg tablet Take 1 tablet (500 mg total) by mouth every 4-6 hours as needed   for pain    senna-docusate (PERICOLACE) 8.6-50 MG per tablet Take 2 tablets by mouth daily as needed for Constipation    albuterol HFA 108 (90 Base) MCG/ACT inhaler Inhale 1-2 puffs into the lungs every 6 hours as needed   Shake well before each use.    dantrolene (DANTRIUM) 100 MG capsule Take 1 capsule (100 mg total) by mouth 2 times daily    Wound Dressings (HYDROFERA BLUE 4"X4") PADS Cut and moisten with saline as directed and pack in to buttock wound daily    Gauze  Pads & Dressings (ABDOMINAL PAD) 8"X10" PADS By 1 each no specified route daily   Cover buttock wounds 2x daily    Adhesive Tape (MEDIPORE SURGICAL 2"X10YD) TAPE Secure the 2 buttocks  wound dressings 2x day    incontinence supply disposable Large pull ups - use up to 5 x daily  Dx N39.46    disposable gloves 1 box Dynarex PF Vinyl Gloves    Non-System Medication Urostomy drainage bags 2000 cc change as needed. Dx N39.46 and  G82.54    Non-System Medication Easy Tip Leg Bags 1000mg  - diagnosis G82.53 N39.46    disposable underpads 30"x36" (CHUX) Use 6 times daily and PRN. Dx N39.42  Incontinence without sensory awareness    disposable gloves 2 boxes Disposable Medium size gloves N39.46    etonogestrel (NEXPLANON) 68 MG IMPL Inject 68 mg into the skin once   Placed 10/20/16    generic DME Use as directed. Hospital bed with Low air loss mattress. Duration of use: 1 year.    hospital bed Use as directed    Non-System Medication Gel overlay mattress for hospital bed - diagnosis G82.54, L89.93, L89.159    Non-System Medication Hospital Bed with Air Mattress - Diagnosis G82.54, L89.93,L89.159  Medicaid  [id number]    generic DME Left fifth digit finger splint for sprain.    Spacer/Aero-Holding Chambers (EASIVENT) spacer Use as instructed    Non-System Medication Medication/Supply: [name] Lift with Sling  Directions for Use: use prn    Non-System Medication Medication/Supply: Replacement Legrest assy parts for Power Wheelchair  Directions for Use: repair power wheelchair    generic DME Use as directed. Hoyer lift. Duration of use: lifetime. ICD:10-G82.53    Non-System Medication Shower Chair - Quadriparesis G82.54    Ostomy Supplies (HOLLISTER REPLACEMENT FILTERS) MISC Hollister Adapter (order # [id number]).    Non-System Medication PRI AND SCREEN  DIAGNOSIS 344.04 Quadriparesis    white petrolatum-mineral oil (GENTEAL PM) 85-15 % ophthalmic ointment Place into both eyes nightly    Skin Protectants, Misc. OINT  Apply 1 Tube topically 2 times daily      Non-System Medication One Hoyer Lift  Diagnosis:  Quadriplegic ICD-9 344.04     Current Facility-Administered Medications   Medication    lidocaine (XYLOCAINE,REGENECARE) 2 % jelly       Medication list reconciled this visit    Allergies:     Allergies   Allergen Reactions    Nitrofurantoin Nausea And Vomiting    Vancomycin Hives     hives 2006 but tolerated Rx in 2010  pt states she had vancomycin in 04/2012.  Feels this is not true allergy  as she has received it recently - had no reaction     Heparin Other (See Comments)     Thrombocytopenia;        Social history      Social History   Substance Use Topics    Smoking status: Former Smoker     Packs/day: 0.25     Years: 3.00     Types: Cigarettes     Start date: 12/09/2011     Quit date: 07/30/2016    Smokeless tobacco: Former Systems developer     Quit date: 04/29/2005      Comment: quit a little over a month ago as of 09/17/17    Alcohol use No       Review of Systems     ROS   12 point review of system was negative except those mentioned in HPI    Physical Exam:     Physical Exam   Constitutional: She is oriented to person, place, and time. She appears well-developed.   Eyes: Conjunctivae are normal.   Cardiovascular: Normal rate, regular rhythm and normal heart sounds.    No edema    Pulmonary/Chest: Effort normal and breath sounds normal. No respiratory distress. She has no wheezes. She has no rales.   Abdominal: Soft. Bowel sounds are normal. There is no tenderness.   Lymphadenopathy:     She has no cervical adenopathy.   Neurological: She is alert and oriented to person, place, and time.   Skin:   Unable to examine buttocks   Psychiatric: She has a normal mood and affect.            Vitals:    09/17/17 0907   BP: 106/76   BP Location: Left arm   Patient Position: Sitting   Cuff Size: adult   Pulse: 68   Temp: 35 C (95 F)   TempSrc: Temporal   Weight: 74.4 kg (164 lb)     Wt Readings from Last 3 Encounters:   09/17/17 74.4 kg  (164 lb)   08/05/17 74.8 kg (165 lb)   08/03/17 74.8 kg (165  lb)     BP Readings from Last 3 Encounters:   09/17/17 106/76   08/05/17 111/65   08/03/17 108/66           RESULTS:       ASSESSMENT/PLAN:     Diagnoses and all orders for this visit:    Quadriplegia, C5-C7 incomplete        Total care will need CDPAS paperwork completed    Neurogenic bladder disorder        Continue to follow-up with urology for urostomy tube    Neurogenic bowel         Continue Peri-Colace, and suppositories    Other orders  -     docusate sodium (COLACE) 100 MG capsule; Take 1 capsule (100 mg total) by mouth 2 times daily  -     bisacodyl (BISAC-EVAC) 10 MG suppository; Place 1 suppository (10 mg total) rectally daily as needed  -     acetaminophen (MAPAP) 500 mg tablet; Take 1 tablet (500 mg total) by mouth every 4-6 hours as needed   for pain  -     senna-docusate (PERICOLACE) 8.6-50 MG per tablet; Take 2 tablets by mouth daily as needed for Constipation  -     albuterol HFA 108 (90 Base) MCG/ACT inhaler; Inhale 1-2 puffs into the lungs every 6 hours as needed   Shake well before each use.      Follow up: Follow-up with CCP 1-2 months           Lavone Orn,  Nurse Practitioner  [facility]

## 2017-09-21 NOTE — Telephone Encounter (Signed)
Patient was seen by [name] NP on 9/21. Form was given to her to further advise

## 2017-09-21 NOTE — Telephone Encounter (Signed)
-----   Message from Sherlon Handing, NP sent at 09/21/2017  8:31 AM EDT -----  Needs CDPAS paperwork

## 2017-09-22 ENCOUNTER — Encounter: Payer: Self-pay | Admitting: Gastroenterology

## 2017-09-23 ENCOUNTER — Emergency Department: Payer: Medicaid Other

## 2017-09-23 ENCOUNTER — Emergency Department
Admission: EM | Admit: 2017-09-23 | Discharge: 2017-09-23 | Disposition: A | Discharge status: Home / Self Care | Payer: Medicaid Other | Source: Ambulatory Visit | Attending: Emergency Medicine | Admitting: Emergency Medicine

## 2017-09-23 ENCOUNTER — Other Ambulatory Visit: Payer: Medicaid Other

## 2017-09-23 DIAGNOSIS — R6883 Chills (without fever): Secondary | ICD-10-CM

## 2017-09-23 DIAGNOSIS — N3001 Acute cystitis with hematuria: Secondary | ICD-10-CM | POA: Insufficient documentation

## 2017-09-23 DIAGNOSIS — R61 Generalized hyperhidrosis: Secondary | ICD-10-CM

## 2017-09-23 DIAGNOSIS — J9811 Atelectasis: Secondary | ICD-10-CM

## 2017-09-23 DIAGNOSIS — Z87891 Personal history of nicotine dependence: Secondary | ICD-10-CM | POA: Insufficient documentation

## 2017-09-23 DIAGNOSIS — M62838 Other muscle spasm: Secondary | ICD-10-CM | POA: Insufficient documentation

## 2017-09-23 DIAGNOSIS — R140 Abdominal distension (gaseous): Secondary | ICD-10-CM

## 2017-09-23 LAB — URINALYSIS REFLEX TO CULTURE
Ketones, UA: NEGATIVE
Nitrite,UA: POSITIVE — AB
Protein,UA: 100 mg/dL — AB
Specific Gravity,UA: 1.01 (ref 1.002–1.030)
pH,UA: 6 (ref 5.0–8.0)

## 2017-09-23 LAB — PLASMA PROF 7 (ED ONLY)
Anion Gap,PL: 18 — ABNORMAL HIGH (ref 7–16)
CO2,Plasma: 22 mmol/L (ref 20–28)
Chloride,Plasma: 106 mmol/L (ref 96–108)
Creatinine: 0.4 mg/dL — ABNORMAL LOW (ref 0.51–0.95)
GFR,Black: 159 *
GFR,Caucasian: 138 *
Glucose,Plasma: 97 mg/dL (ref 60–99)
Sodium,Plasma: 146 mmol/L — ABNORMAL HIGH (ref 133–145)
UN,Plasma: 11 mg/dL (ref 6–20)

## 2017-09-23 LAB — CBC AND DIFFERENTIAL
Baso # K/uL: 0 10*3/uL (ref 0.0–0.1)
Basophil %: 0.3 %
Eos # K/uL: 0 10*3/uL (ref 0.0–0.4)
Eosinophil %: 0.3 %
Hematocrit: 45 % (ref 34–45)
Hemoglobin: 14.8 g/dL (ref 11.2–15.7)
IMM Granulocytes #: 0 10*3/uL (ref 0.0–0.1)
IMM Granulocytes: 0.1 %
Lymph # K/uL: 2.7 10*3/uL (ref 1.2–3.7)
Lymphocyte %: 37.9 %
MCH: 31 pg/cell (ref 26–32)
MCHC: 33 g/dL (ref 32–36)
MCV: 94 fL (ref 79–95)
Mono # K/uL: 0.4 10*3/uL (ref 0.2–0.9)
Monocyte %: 5.4 %
Neut # K/uL: 3.9 10*3/uL (ref 1.6–6.1)
Nucl RBC # K/uL: 0 10*3/uL (ref 0.0–0.0)
Nucl RBC %: 0 /100 WBC (ref 0.0–0.2)
Platelets: 108 10*3/uL — ABNORMAL LOW (ref 160–370)
RBC: 4.8 MIL/uL (ref 3.9–5.2)
RDW: 15.9 % — ABNORMAL HIGH (ref 11.7–14.4)
Seg Neut %: 56 %
WBC: 7 10*3/uL (ref 4.0–10.0)

## 2017-09-23 LAB — RUQ PANEL (ED ONLY)
Albumin: 4.6 g/dL (ref 3.5–5.2)
Amylase: 45 U/L (ref 28–100)
Bilirubin,Direct: <0.2 mg/dL (ref 0.0–0.3)
Bilirubin,Total: 0.5 mg/dL (ref 0.0–1.2)
Lipase: 33 U/L (ref 13–60)
Total Protein: 8.7 g/dL — ABNORMAL HIGH (ref 6.3–7.7)

## 2017-09-23 LAB — INFLUENZA A: Influenza A PCR: 0

## 2017-09-23 LAB — URINE MICROSCOPIC (IQ200)

## 2017-09-23 LAB — HOLD BLUE

## 2017-09-23 LAB — RSV PCR: RSV PCR: 0

## 2017-09-23 LAB — POTASSIUM

## 2017-09-23 LAB — INFLUENZA B PCR: Influenza B PCR: 0

## 2017-09-23 LAB — PREGNANCY TEST, SERUM: Preg,Serum: NEGATIVE

## 2017-09-23 MED ORDER — DANTROLENE SODIUM 100 MG PO CAPS *I*
100.0000 mg | ORAL_CAPSULE | Freq: Two times a day (BID) | ORAL | 4 refills | Status: AC
Start: 2017-09-23 — End: 2017-10-07

## 2017-09-23 MED ORDER — CIPROFLOXACIN HCL 500 MG PO TABS *I*
500.0000 mg | ORAL_TABLET | Freq: Two times a day (BID) | ORAL | 0 refills | Status: AC
Start: 2017-09-23 — End: 2017-10-03

## 2017-09-23 MED ORDER — AMOXICILLIN-POT CLAVULANATE 875-125 MG PO TABS *I*
1.0000 | ORAL_TABLET | Freq: Two times a day (BID) | ORAL | 0 refills | Status: AC
Start: 2017-09-23 — End: 2017-10-03

## 2017-09-23 MED ORDER — ACETAMINOPHEN 325 MG PO TABS *I*
650.0000 mg | ORAL_TABLET | Freq: Once | ORAL | Status: AC
Start: 2017-09-23 — End: 2017-09-23
  Administered 2017-09-23: 650 mg via ORAL
  Filled 2017-09-23: qty 2

## 2017-09-23 NOTE — ED Notes (Signed)
Urine sample obtained by urology via straight cath and sent at this time.

## 2017-09-23 NOTE — Discharge Instructions (Signed)
You were seen today in the ED with profuse sweating and fast heart rate, symptoms were similar to when you have had a urinary tract infection in the past.   Urology sent a urine sample from a straight catheterization they performed and urine shows blood and white blood cells (signs of infection).   You were started on 2 antibiotics to cover sources of infection.   Take antibiotics as prescribed:       Cipro 1 tablet 2 times a day       Augmentin 1 tablet 2 times a day  Please follow up with your doctor early next week so they can see how you are feeling and to review your urine cultures which take a few days to grow.    You were also out of your muscle relaxant medication due to insurance issues at your pharmacy.   You were given a 2 week supply of your muscle relaxant.   Please mention this to your PCP when you follow-up so they can send your prescription to a pharmacy that can fill it through your insurance.     Return to the emergency department for any worsening symptoms- increased pain, difficulty breathing, fever/chills, or with any other concerns.

## 2017-09-23 NOTE — ED Provider Progress Notes (Signed)
ED Provider Progress Note    Lab work showed a normal blood count and normal kidney function. Flu/RSV swab was negative.   CXR without infectious findings.   Urology was contacted to perform a straight cath in patients ostomy and urine showed 2+ leuks, pos nitrate, 3+ blood, culture was sent.     Based on UA results patient will be started on Augmentin and Cipro (per discussion with pharmacist). She can f/u with her PCP to review her urine cultures and potentially narrow antibiotics.     Will d/c patient to home.   Patient and her mother aware of findings and plan of care and agree with treatment at this time.      Cathi Roan, NP, 09/23/2017, 2:56 PM    Collaborating physician Dr. Heather Roberts was immediately available     Cathi Roan, NP  09/23/17 1456

## 2017-09-23 NOTE — ED Triage Notes (Signed)
Pt states she ran out of her muscle spasm meds a week ago. Has not been able to sleep the last few days.        Triage Note   Suanne Marker, RN

## 2017-09-23 NOTE — ED Notes (Signed)
Per provider Gotie, urology is to come down and straight cath patient's stoma for urostomy for clean specimen.
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
## 2017-09-23 NOTE — ED Notes (Signed)
Pt tolerating PO intake. Pt discharge instructions reviewed. Pt comfortable with discharge planning and verbalizes understanding of discharge instruction. Pt will follow up with PCP. Pt belongings with pt. Pt safe to discharge at this time.

## 2017-09-24 ENCOUNTER — Telehealth: Payer: Self-pay | Admitting: Urology

## 2017-09-24 NOTE — Telephone Encounter (Signed)
Cindy Ochoa was in the ER for a uti and would like to be seen by Derrek Monaco. Writer does not have availability until November. Please call her at [phone].

## 2017-09-24 NOTE — Telephone Encounter (Signed)
CDPAS paperwork and completed. Faxed 09/23/17.

## 2017-09-25 LAB — AEROBIC CULTURE

## 2017-09-27 ENCOUNTER — Telehealth: Payer: Self-pay | Admitting: Internal Medicine

## 2017-09-27 ENCOUNTER — Encounter: Payer: Self-pay | Admitting: Emergency Medicine

## 2017-09-27 NOTE — Telephone Encounter (Signed)
Paperwork hsa been filled and will fax to patient new insurance [employer] 09/27/17

## 2017-09-27 NOTE — Telephone Encounter (Signed)
Per [facility] patient has been scheduled to see her on 10/25/17 at 11:30 patient aware

## 2017-09-27 NOTE — Telephone Encounter (Signed)
Beth from [facility] called to inform the doctor that patient has recently changed insurance carriers.     Please forward request for aide services to [place].     If any questions please contact her at [phone]

## 2017-09-27 NOTE — Telephone Encounter (Signed)
Patient seen in [facility] ED on 09/23/17 for Acute Cystitis with Hematuria. Patient stated she was referred to [facility] and requested first available appointment with Dr. Sharlene Motts on 11/05/17 if Santa Genera is not available sooner. Added to wait list.     Patient would like a call back for a sooner appointment with any Provider. Patient has [facility]. Patient will need a hoyer. She can be reached at [phone].

## 2017-09-27 NOTE — Telephone Encounter (Signed)
Message noted, will refaxe PCA form to [facility] 09/27/17.

## 2017-09-27 NOTE — Telephone Encounter (Signed)
Ms. Terrero called requesting for [name], NP to complete re-certification forms for Home Care Aid services    Patient can be reached at [phone]

## 2017-10-08 ENCOUNTER — Telehealth: Payer: Self-pay | Admitting: Internal Medicine

## 2017-10-08 ENCOUNTER — Ambulatory Visit: Payer: Medicaid Other

## 2017-10-08 NOTE — Telephone Encounter (Signed)
Cindy Ochoa is calling to cancel her appointment which is currently scheduled for 10/12 for a flu vaccination    Has the appointment been cancelled? yes    Has the appointment been rescheduled? yes    Date of new appointment?10/11/17     Does the patient need a call back to reschedule?no    Patient can be contacted back at [phone]

## 2017-10-08 NOTE — Telephone Encounter (Signed)
Message noted.

## 2017-10-11 ENCOUNTER — Ambulatory Visit: Payer: Medicaid Other

## 2017-10-25 ENCOUNTER — Ambulatory Visit: Payer: Medicaid Other | Admitting: Urology

## 2017-11-03 ENCOUNTER — Encounter: Payer: Medicaid Other | Admitting: Surgery

## 2017-11-05 ENCOUNTER — Encounter: Payer: Self-pay | Admitting: Internal Medicine

## 2017-11-05 ENCOUNTER — Ambulatory Visit: Payer: Medicaid Other | Admitting: Urology

## 2017-11-05 ENCOUNTER — Ambulatory Visit: Payer: MEDICAID | Admitting: Internal Medicine

## 2017-11-05 ENCOUNTER — Telehealth: Payer: Self-pay

## 2017-11-05 ENCOUNTER — Ambulatory Visit: Payer: MEDICAID | Attending: Internal Medicine | Admitting: Surgery

## 2017-11-05 VITALS — BP 113/62 | HR 82 | Temp 95.9°F | Ht 65.0 in | Wt 164.0 lb

## 2017-11-05 DIAGNOSIS — G8250 Quadriplegia, unspecified: Secondary | ICD-10-CM

## 2017-11-05 DIAGNOSIS — F32A Depression, unspecified: Secondary | ICD-10-CM

## 2017-11-05 DIAGNOSIS — Z993 Dependence on wheelchair: Secondary | ICD-10-CM

## 2017-11-05 DIAGNOSIS — L89313 Pressure ulcer of right buttock, stage 3: Secondary | ICD-10-CM | POA: Insufficient documentation

## 2017-11-05 DIAGNOSIS — Z23 Encounter for immunization: Secondary | ICD-10-CM

## 2017-11-05 DIAGNOSIS — G8254 Quadriplegia, C5-C7 incomplete: Secondary | ICD-10-CM

## 2017-11-05 DIAGNOSIS — N319 Neuromuscular dysfunction of bladder, unspecified: Secondary | ICD-10-CM

## 2017-11-05 MED ORDER — INCONTINENCE SUPPLY DISPOSABLE MISC *A*
5 refills | Status: DC
Start: 2017-11-05 — End: 2017-11-12

## 2017-11-05 MED ORDER — DISPOSABLE UNDERPADS 30"X36" MISC *A*
5 refills | Status: DC
Start: 2017-11-05 — End: 2017-11-12

## 2017-11-05 MED ORDER — DISPOSABLE GLOVES MISC *A*
6 refills | Status: DC
Start: 2017-11-05 — End: 2017-11-12

## 2017-11-05 MED ORDER — DISPOSABLE GLOVES MISC *A*
5 refills | Status: DC
Start: 2017-11-05 — End: 2017-11-12

## 2017-11-05 MED ORDER — LIDOCAINE HCL 4% EX SOLN (MULTIPLE PATIENTS) *I*
1.0000 mL | CUTANEOUS | Status: AC | PRN
Start: 2017-11-05 — End: 2018-01-05
  Administered 2017-11-05: 1 mL via TOPICAL

## 2017-11-05 MED ORDER — NON-SYSTEM MEDICATION *A*
0 refills | Status: AC
Start: 2017-11-05 — End: ?

## 2017-11-05 NOTE — Telephone Encounter (Signed)
Cindy Ochoa called to give Cindy Ochoa information for [employer].   Cindy Ochoa [phone] contact is Cindy Ochoa)

## 2017-11-05 NOTE — Patient Instructions (Addendum)
Please follow these instructions about how to care for your wound.    .    Wound Location Dressing Orders Frequency of Dressing Change   Location: Sacral                                      Location: Right ischium Calmoseptine to peri wound  Use cavilon wipe on the skin anywhere where the adhesive may touch  Cut square of hydrafera blue into 3 strips  Wet one strip before each dressing change (save the other 2 into a baggie)  Ring out excess water  Pack wound with damp hydrafera blue, leave a small tail hanging out  Cover with ABD pad  Tape  *NEW* You may use just Abd pad and tape on the days that the wound is drier*      Calmoseptine around wound  abd pad to cover      Vitamin a+d to dry areas or vaseline     [x] When the hydrafera blue changes white (instead of blue), you should change the dressing  You can change the abd pad as often as necessary                              Daily     Cleanse Wound(s) with:    [x] Soap &Water with dressing changes, keep dressing dry in between       [x] Leg Elevation: Elevate leg(s) to level of heart   or above for 30 minutes 3 times per day.  [x] Right   Leg [x] Left   Leg       [x] Assistive Device(s): [x] Wheelchair    [x] Pressure Reduction: [x] Wheelchair Cushion   [x] Specialty Bed [x] Reposition every 2 hours   [x] Limit time up in your chair to 2 hours or less at a time      [x] General: [x] Stop / Decrease Smoking        [] Discharge from [facility] Discharge Instructions    Following the instructions below will give you the best opportunity for wound healing.    Wound Care Instructions (If changing own dressing):  Gather all supplies you will need to change your dressing.  Before changing your dressing, wash your hands for at least 15 seconds with warm soapy water.  Rinse off all soap, then  dry with a towel.  Remove the old dressing. Wash hands again before applying the new dressing.    If you experience any of the following during our business hours of 8 AM - 4:30 PM, Monday - Friday, please call the [facility] at [phone]:    ?Increase in pain ?Temperature over 101F ?Drainage with a foul odor ?Bleeding ?Increase in swelling ?Increase in drainage from your wound ?Need for compression bandage changes (slippage, breakthrough drainage)    Please contact your primary care physician or proceed to the nearest emergency room if you experience any of the above after our business hours.     Please note your appointment(s) above - if you are unable to keep, kindly give 24 hours' notice. Thank You!
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
## 2017-11-08 ENCOUNTER — Telehealth: Payer: Self-pay | Admitting: Internal Medicine
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
## 2017-11-08 NOTE — Telephone Encounter (Signed)
-----   Message from Sherlon Handing, NP sent at 11/05/2017  5:10 PM EST -----  rx need to be seen to [facility] number in my note  I need cdpac  Form

## 2017-11-08 NOTE — Telephone Encounter (Signed)
CDPAC form started and placed to NP [place] desk to be filled.

## 2017-11-09 ENCOUNTER — Encounter: Payer: Self-pay | Admitting: Urology

## 2017-11-09 ENCOUNTER — Encounter: Payer: Self-pay | Admitting: Physical Medicine and Rehabilitation

## 2017-11-12 ENCOUNTER — Encounter: Payer: Self-pay | Admitting: Gastroenterology

## 2017-11-12 ENCOUNTER — Telehealth: Payer: Self-pay | Admitting: Internal Medicine

## 2017-11-12 ENCOUNTER — Other Ambulatory Visit: Payer: Self-pay | Admitting: Surgery

## 2017-11-12 ENCOUNTER — Telehealth: Payer: Self-pay | Admitting: Urology

## 2017-11-12 ENCOUNTER — Other Ambulatory Visit: Payer: Self-pay | Admitting: Urology

## 2017-11-12 ENCOUNTER — Other Ambulatory Visit: Payer: Self-pay | Admitting: Internal Medicine

## 2017-11-12 DIAGNOSIS — L89312 Pressure ulcer of right buttock, stage 2: Secondary | ICD-10-CM

## 2017-11-12 DIAGNOSIS — G8250 Quadriplegia, unspecified: Secondary | ICD-10-CM

## 2017-11-12 DIAGNOSIS — G8254 Quadriplegia, C5-C7 incomplete: Secondary | ICD-10-CM

## 2017-11-12 DIAGNOSIS — N319 Neuromuscular dysfunction of bladder, unspecified: Secondary | ICD-10-CM

## 2017-11-12 DIAGNOSIS — L89153 Pressure ulcer of sacral region, stage 3: Secondary | ICD-10-CM

## 2017-11-12 NOTE — Telephone Encounter (Signed)
Called patient, left VM.    When she calls back, please find out the following:    If she is looking to continue with [facility], she needs to call [facility].    If she is looking to transfer services to [facility], please ask her what supplies she needs and what sizes she uses.    Thanks

## 2017-11-12 NOTE — Telephone Encounter (Signed)
Alyric is calling to get her ostomy supplies sent over to [facility]. She can be reached at [phone]

## 2017-11-12 NOTE — Telephone Encounter (Signed)
Kim, care manager with [facility], called in regards to patient   Caller states that her case is closed because she went to [facility]     This is due to patient now being Medicaid   Caller can be reached at [phone] if needed

## 2017-11-12 NOTE — Telephone Encounter (Signed)
Message noted.

## 2017-11-15 ENCOUNTER — Other Ambulatory Visit: Payer: Self-pay | Admitting: Surgery

## 2017-11-15 DIAGNOSIS — L89312 Pressure ulcer of right buttock, stage 2: Secondary | ICD-10-CM

## 2017-11-15 DIAGNOSIS — L89153 Pressure ulcer of sacral region, stage 3: Secondary | ICD-10-CM

## 2017-11-15 MED ORDER — HYDROFERA BLUE 4"X4" EX PADS
MEDICATED_PAD | CUTANEOUS | 6 refills | Status: DC
Start: 2017-11-15 — End: 2021-07-11

## 2017-11-15 MED ORDER — ABDOMINAL PAD 8"X10" PADS
1.0000 | MEDICATED_PAD | Freq: Every day | 6 refills | Status: DC
Start: 2017-11-15 — End: 2017-12-24

## 2017-11-15 MED ORDER — DISPOSABLE GLOVES MISC *A*
6 refills | Status: DC
Start: 2017-11-15 — End: 2017-12-09

## 2017-11-15 MED ORDER — DISPOSABLE GLOVES MISC *A*
5 refills | Status: DC
Start: 2017-11-15 — End: 2017-12-24

## 2017-11-15 MED ORDER — MEDIPORE SURGICAL 2"X10YD TAPE
MEDICATED_TAPE | 6 refills | Status: DC
Start: 2017-11-15 — End: 2017-12-24

## 2017-11-15 MED ORDER — INCONTINENCE SUPPLY DISPOSABLE MISC *A*
5 refills | Status: DC
Start: 2017-11-15 — End: 2017-12-24

## 2017-11-15 MED ORDER — DISPOSABLE UNDERPADS 30"X36" MISC *A*
5 refills | Status: DC
Start: 2017-11-15 — End: 2017-12-09

## 2017-11-15 NOTE — Telephone Encounter (Signed)
Patient is requesting an urgent appointment. She states she needs to get her stoma measured. She states without that information, she can not get her ostomy supplies filled.    Please contact Daphna at [phone]

## 2017-11-15 NOTE — Telephone Encounter (Signed)
Called [facility]. She does not know what products she was using.     I've contacted [facility] to obtain this information. If we are unsuccessful, patient will need to be seen Wednesday 11/21 in the afternoon for assistance.

## 2017-11-15 NOTE — Telephone Encounter (Signed)
Rx request routed to provider on 11/15/2017 at 8:11 AM

## 2017-11-16 ENCOUNTER — Other Ambulatory Visit: Payer: Self-pay | Admitting: Urology

## 2017-11-16 ENCOUNTER — Telehealth: Payer: Self-pay

## 2017-11-16 ENCOUNTER — Encounter: Payer: Self-pay | Admitting: Urology

## 2017-11-16 DIAGNOSIS — N319 Neuromuscular dysfunction of bladder, unspecified: Secondary | ICD-10-CM

## 2017-11-16 MED ORDER — OSTOMY SUPPLIES MISC
11 refills | Status: DC
Start: 2017-11-16 — End: 2017-12-24

## 2017-11-16 MED ORDER — OSTOMY SUPPLIES POUCH MISC
11 refills | Status: DC
Start: 2017-11-16 — End: 2017-12-24

## 2017-11-16 MED ORDER — OSTOMY SUPPLIES WAFR
WAFER | 11 refills | Status: DC
Start: 2017-11-16 — End: 2017-12-09

## 2017-11-16 NOTE — Telephone Encounter (Signed)
Anda Kraft, with [facility] [employer], states they do not participate with [facility] and she needs to speak with someone for new scripts for this patient's meds.  Please call her @ [phone].  Her fax # is [phone].

## 2017-11-16 NOTE — Telephone Encounter (Signed)
Returned call to [employer] at [employer]. They will be ordering all of patient's wound care supplies through their vendor, since they do not contract with [facility]. All wound measurements, ICD.10 codes, and dressing supplies (ABD, cavilon wipes, hydrofera blue and hypafix) were given to [place]. [facility] will then place the order. All questions were answered and both parties verbalized agreement with this plan.

## 2017-11-23 ENCOUNTER — Telehealth: Payer: Self-pay | Admitting: Internal Medicine

## 2017-11-23 NOTE — Telephone Encounter (Signed)
Attempted to contact caller at [facility], the phone call was on hold for over 10 minutes.   Last office visit note faxed to [facility] with a note asking Abbie Sons to call the office if she has any other questions about patient's medical care

## 2017-11-23 NOTE — Telephone Encounter (Signed)
Faxing form requested to [phone] today 11/23/17.    Will forward encounter to RN pool for advise.

## 2017-11-23 NOTE — Telephone Encounter (Signed)
Abbie Sons from [facility] faxed a request for a current med list and diagnosis list on 11/9 but has not received it.  Verified correct fax number.  Can this information be faxed to her at [phone]?  Also, she has questions about Cindy Ochoa's medical care.  Please call her.

## 2017-11-24 ENCOUNTER — Telehealth: Payer: Self-pay | Admitting: Internal Medicine

## 2017-11-24 NOTE — Telephone Encounter (Signed)
Spoke with Anda Kraft who is a Transport planner at [employer] (company that deals with the Managed Long-Term care insurance).     Anda Kraft states that she's submitted the pt to the medical review team to make sure that she's getting all the services she's eligible for to maintain independence in the community.     Anda Kraft states that the medical director has some follow up questions:   - Is the patient able to self administer any medications that are required in the evening hours or through the night?  - Is the patient independent in caring for her urostomy?  - There is a note that the pt needs turning / re-positioning every 1-2 hours, is this while awake or also through the night / when sleeping?    Additional questions:   - Does the patient have a hospital bed? Pt reported no when asked, but Anda Kraft states there is note in one of the visit notes about hospital bed at home.   Wound care doctor is advising a gel overlay - need to determine what kind of bed.   - How is the patient's general understanding / reliability? Example: Anda Kraft asked the patient if she's had her wheelchair serviced at all. Pt stated she had not. Anda Kraft spoke with [facility] and they advised they were out 2-3 weeks prior installing a new seatbelt.      Anda Kraft has some other concerns:   Pt currently getting 129 hours of aide service.   Anda Kraft spoke with the agency and they said that pt does currently have two aides and two more will be hired - Anda Kraft states that most agencies will only allow aides to work 40 hrs / week, which means that many of pt's hours are not getting covered - therefore, worried about patient's safety at home.   Anda Kraft states that pt has 78 year old son, but because under age 78, cannot be counted in any of her care hours.   Pt states she does not have any additional family / support in the area.   Pt does have a Power of Attorney, which is her Therapist who she sees weekly.     Routing to [employer] who has seen pt most recently, CCP unavailable.

## 2017-11-24 NOTE — Telephone Encounter (Signed)
Per med list:   Colace BID  Bisacodyl suppository one daily PRN   Senna-docusate daily     Call to [facility].   Anda Kraft is not available.   Anda Kraft does not have a direct extension.   Provided writer's direct line for call back.

## 2017-11-24 NOTE — Telephone Encounter (Signed)
Cindy Ochoa with [facility] is requesting clarification on the patient procedure for her bowel regimen during the night and the day  (see telephone encounter 11/23/17)    Cindy Ochoa can be reached at [phone]

## 2017-11-24 NOTE — Telephone Encounter (Signed)
Cindy Ochoa called back, left message on writer's voicemail.   Cindy Ochoa states she has some questions regarding her bowel regimen and her urostomy.   Cindy Ochoa requesting call back - [phone].     Call to number above.   Reached answering service for [facility] - writer was advised that they are supposed to be open until 5:30 PM.   Representative advised trying again later.

## 2017-11-26 ENCOUNTER — Encounter: Payer: Self-pay | Admitting: Internal Medicine

## 2017-11-26 ENCOUNTER — Encounter: Payer: Self-pay | Admitting: Physical Medicine and Rehabilitation

## 2017-11-26 DIAGNOSIS — G8250 Quadriplegia, unspecified: Secondary | ICD-10-CM

## 2017-11-26 MED ORDER — BACLOFEN 10 MG PO TABS *I*
10.0000 mg | ORAL_TABLET | Freq: Three times a day (TID) | ORAL | 5 refills | Status: DC
Start: 2017-11-26 — End: 2017-11-26

## 2017-11-26 MED ORDER — BACLOFEN 10 MG PO TABS *I*
10.0000 mg | ORAL_TABLET | Freq: Three times a day (TID) | ORAL | 5 refills | Status: DC
Start: 2017-11-26 — End: 2018-03-18

## 2017-11-26 NOTE — Telephone Encounter (Signed)
I am pretty sure she has hospital bed    She has had more than 40 hours of health care for years.  129 for current was approved by previous insurance     Mother will some times help     Care for urostomy can be done during the day  She has no fine motor skills     repositioning is whenever some one is with her     She does not have any evening medications     Have no idea how to help with wheelchair, she needs it otherwise she will be in bed    Can we call her about the rest of the questions     [place], NP

## 2017-11-27 MED ORDER — DANTROLENE SODIUM 100 MG PO CAPS *I*
100.0000 mg | ORAL_CAPSULE | Freq: Two times a day (BID) | ORAL | 5 refills | Status: DC
Start: 2017-11-27 — End: 2018-03-18

## 2017-11-29 ENCOUNTER — Other Ambulatory Visit: Payer: Self-pay | Admitting: Physical Medicine and Rehabilitation

## 2017-12-02 ENCOUNTER — Telehealth: Payer: Self-pay | Admitting: Urology

## 2017-12-02 NOTE — Telephone Encounter (Signed)
Patient mother Cindy Ochoa) arrived to pick up bed bags for daughter Cindy Ochoa.

## 2017-12-08 ENCOUNTER — Telehealth: Payer: Self-pay | Admitting: Urology

## 2017-12-08 ENCOUNTER — Telehealth: Payer: Self-pay

## 2017-12-08 NOTE — Telephone Encounter (Signed)
Bruna mother Remus Blake stated that [facility] is completely out of wound care supplies,she reached out to [facility] but they stated that they haven't received a prescription from the wound center.[name] also mentioned that [facility] doesn't have anything covering her wound.Denise 585 [phone].

## 2017-12-08 NOTE — Telephone Encounter (Signed)
Supplies for wound have not been received per patient's mom. Anda Kraft from [facility] was given order on 11/16/17, however, patient still has not received supplies. Writer spoke with Anda Kraft from [facility] [facility] and awaiting call back.

## 2017-12-08 NOTE — Telephone Encounter (Signed)
Spoke with [name] and told her the Rx's had been sent to [facility].     I called [facility] and they state they sent a fax to the office requesting more information regarding the prescriptions.     [name],   Have you seen this fax? The patient is frustrated that she still doesn't have any supplies.

## 2017-12-08 NOTE — Telephone Encounter (Signed)
Writer called [facility] and spoke with Mickel Baas who confirmed that order was placed for wound care supplies and should be arriving via UPS to patient's door tomorrow (12/09/17). Writer notified [name] (mom). Understanding verbalized and both parties agreeable to plan.

## 2017-12-08 NOTE — Telephone Encounter (Signed)
Writer spoke with Cindy Ochoa from [employer]. Wound care supplies will be ordered from a company called [employer]. [facility] [employer] phone number is [phone] and Fax number is [phone]. Per Cindy Ochoa supplies will be ordered today after approval from her direct supervisor. Writer spoke with [name] (patient's mom) and informed her of the conversation with Cindy Ochoa from [employer] [facility] as well as provided her with the phone number for [employer]. Writer will also call [facility] [employer] to verify order placement. Understanding verbalized and both parties agreeable to plan.

## 2017-12-08 NOTE — Telephone Encounter (Signed)
I did, I sent if back to them I will call [facility].

## 2017-12-08 NOTE — Telephone Encounter (Signed)
Patient is calling in regards to the message below. She is very upset and is requesting to speak with someone as soon as possible. She states she would like to be seen urgently because she is having problems with getting catheter supplies. Please call her back as soon as possible to discuss. She can be reached at [phone].

## 2017-12-08 NOTE — Telephone Encounter (Signed)
[name] is calling to speak with a nurse. States that this is in regards to measurements for the patient's catheter supplies.    [name] can be reached at [phone]

## 2017-12-08 NOTE — Telephone Encounter (Signed)
Patient's mother, [name], is calling. She states she would like the office to contact her at [phone], instead of Meila, as Ranay will be busy today

## 2017-12-08 NOTE — Telephone Encounter (Signed)
[employer] [facility]. They have what they need to fill the patient's order. Asked that they call the office with further concerns.    Called [facility]. Asked that they call us if this is not straightened out by Monday.

## 2017-12-09 ENCOUNTER — Telehealth: Payer: Self-pay | Admitting: Internal Medicine

## 2017-12-09 DIAGNOSIS — N319 Neuromuscular dysfunction of bladder, unspecified: Secondary | ICD-10-CM

## 2017-12-09 DIAGNOSIS — G8254 Quadriplegia, C5-C7 incomplete: Secondary | ICD-10-CM

## 2017-12-09 MED ORDER — DISPOSABLE GLOVES MISC *A*
99 refills | Status: DC
Start: 2017-12-09 — End: 2017-12-14

## 2017-12-09 MED ORDER — OSTOMY SUPPLIES WAFR
WAFER | 99 refills | Status: DC
Start: 2017-12-09 — End: 2017-12-14

## 2017-12-09 MED ORDER — NON-SYSTEM MEDICATION *A*
99 refills | Status: DC
Start: 2017-12-09 — End: 2017-12-14

## 2017-12-09 MED ORDER — DISPOSABLE UNDERPADS 30"X36" MISC *A*
99 refills | Status: DC
Start: 2017-12-09 — End: 2017-12-14

## 2017-12-09 NOTE — Telephone Encounter (Signed)
Hannah/ [facility] is calling to report that patient was trying to make an appointment there.  She will be sending a request for office notes. She states that she was unsure the issue the patient was having but might be something related to a SP tube. She is wondering if any notes can be sent over regarding patient's condition.      Fax number is [phone]      Jarrett Soho can be reached at [phone]

## 2017-12-13 NOTE — Telephone Encounter (Signed)
Denise-the patient's mother is calling back regarding message below. She states patient still does not have her supplies and they are concerned that the bag she has been using will become infected.    Please return her call at: [phone]

## 2017-12-13 NOTE — Telephone Encounter (Signed)
Sent My Chart message letting patient know I can get her some ostomy supplies (samples) but that they may not be her preferred item numbers as they are a variety of samples.     Also let them know if they want to pay cash we can get her the web site of online retailers that could overnight her some supplies until this is straightened out. Helyn App, RN

## 2017-12-13 NOTE — Telephone Encounter (Signed)
Spoke with Gwenette Greet at [employer]. The problem they are having getting [facility] supplies is with insurance. Insurance only covers the cost of the generic supplies, not the brand name, [employer]. In order for the family to get insurance to cover the cost of the brand name supplies they need to contact the [employer] representative they use Anda Kraft [place] @ [phone]) and get a stipulation written that they will cover the full cost of the supplies. Anda Kraft could contact Gwenette Greet at [employer] for a quote and/or full pricing.     Spoke with [name], Keanu's mom. She is very upset. She states the patient's ostomy bag is turning blue meaning it is no longer any good. I explained the situation. She is going to call Anda Kraft with [employer]. In the meantime I have also emailed our [employer] representative to see if there is anything they can help with, even if it is some samples until we can get this straightened out. I will keep mom updated and she will do the same for [place]. Helyn App, RN

## 2017-12-14 ENCOUNTER — Other Ambulatory Visit: Payer: Self-pay | Admitting: Internal Medicine

## 2017-12-14 DIAGNOSIS — N319 Neuromuscular dysfunction of bladder, unspecified: Secondary | ICD-10-CM

## 2017-12-14 DIAGNOSIS — G8254 Quadriplegia, C5-C7 incomplete: Secondary | ICD-10-CM

## 2017-12-14 MED ORDER — DISPOSABLE UNDERPADS 30"X36" MISC *A*
99 refills | Status: DC
Start: 2017-12-14 — End: 2017-12-14

## 2017-12-14 MED ORDER — DISPOSABLE GLOVES MISC *A*
99 refills | Status: DC
Start: 2017-12-14 — End: 2017-12-24

## 2017-12-14 MED ORDER — NON-SYSTEM MEDICATION *A*
99 refills | Status: DC
Start: 2017-12-14 — End: 2017-12-14

## 2017-12-14 MED ORDER — DISPOSABLE GLOVES MISC *A*
99 refills | Status: DC
Start: 2017-12-14 — End: 2017-12-14

## 2017-12-14 MED ORDER — OSTOMY SUPPLIES WAFR
WAFER | 99 refills | Status: DC
Start: 2017-12-14 — End: 2017-12-24

## 2017-12-14 MED ORDER — NON-SYSTEM MEDICATION *A*
99 refills | Status: DC
Start: 2017-12-14 — End: 2017-12-24

## 2017-12-14 MED ORDER — DISPOSABLE UNDERPADS 30"X36" MISC *A*
99 refills | Status: DC
Start: 2017-12-14 — End: 2017-12-24

## 2017-12-14 MED ORDER — OSTOMY SUPPLIES WAFR
WAFER | 99 refills | Status: DC
Start: 2017-12-14 — End: 2017-12-14

## 2017-12-14 MED ORDER — GENERIC DME *A*
99 refills | Status: DC
Start: 2017-12-14 — End: 2017-12-24

## 2017-12-14 NOTE — Telephone Encounter (Signed)
Spoke to [place] at [facility] said that patient's mother [name] was trying to get patient scheduled, but [name] did not give much of a reason why she needed to be seen. Left voicemail for [name], advised that patient needed to discuss the issue with her PCP first, and then her PCP would issue a referral for her to see Urology.

## 2017-12-22 ENCOUNTER — Ambulatory Visit: Payer: MEDICAID | Admitting: Physical Medicine and Rehabilitation

## 2017-12-23 ENCOUNTER — Telehealth: Payer: Self-pay | Admitting: Internal Medicine

## 2017-12-23 DIAGNOSIS — G8254 Quadriplegia, C5-C7 incomplete: Secondary | ICD-10-CM

## 2017-12-23 DIAGNOSIS — N319 Neuromuscular dysfunction of bladder, unspecified: Secondary | ICD-10-CM

## 2017-12-23 DIAGNOSIS — G8250 Quadriplegia, unspecified: Secondary | ICD-10-CM

## 2017-12-23 DIAGNOSIS — L89312 Pressure ulcer of right buttock, stage 2: Secondary | ICD-10-CM

## 2017-12-23 DIAGNOSIS — L89153 Pressure ulcer of sacral region, stage 3: Secondary | ICD-10-CM

## 2017-12-23 NOTE — Telephone Encounter (Signed)
Patient is requesting for the Doctor to send an new script for her medical supplies to [facility]    Patient stated she is switching companies so that she can get her supplies when she is suppose to     Patient can be reached at [phone]

## 2017-12-23 NOTE — Telephone Encounter (Signed)
Requested a sample pack of ostomy supplies be sent to the patient's home. Helyn App, RN

## 2017-12-23 NOTE — Telephone Encounter (Signed)
Message noted- forwarding to Dr. Ron Parker as an Juluis Rainier and [name] to print out new RX.

## 2017-12-24 ENCOUNTER — Encounter: Payer: MEDICAID | Admitting: Surgery

## 2017-12-24 MED ORDER — OSTOMY SUPPLIES MISC
11 refills | Status: DC
Start: 2017-12-24 — End: 2018-11-01

## 2017-12-24 MED ORDER — DISPOSABLE UNDERPADS 30"X36" MISC *A*
99 refills | Status: DC
Start: 2017-12-24 — End: 2019-01-10

## 2017-12-24 MED ORDER — MEDIPORE SURGICAL 2"X10YD TAPE
MEDICATED_TAPE | 6 refills | Status: DC
Start: 2017-12-24 — End: 2021-06-24

## 2017-12-24 MED ORDER — OSTOMY SUPPLIES POUCH MISC
11 refills | Status: DC
Start: 2017-12-24 — End: 2018-11-01

## 2017-12-24 MED ORDER — INCONTINENCE SUPPLY DISPOSABLE MISC *A*
5 refills | Status: DC
Start: 2017-12-24 — End: 2019-01-10

## 2017-12-24 MED ORDER — GENERIC DME *A*
99 refills | Status: DC
Start: 2017-12-24 — End: 2024-05-16

## 2017-12-24 MED ORDER — OSTOMY SUPPLIES WAFR
WAFER | 99 refills | Status: DC
Start: 2017-12-24 — End: 2018-11-01

## 2017-12-24 MED ORDER — DISPOSABLE GLOVES MISC *A*
5 refills | Status: DC
Start: 2017-12-24 — End: 2019-01-11

## 2017-12-24 MED ORDER — ABDOMINAL PAD 8"X10" PADS
1.0000 | MEDICATED_PAD | Freq: Every day | 6 refills | Status: DC
Start: 2017-12-24 — End: 2024-05-16

## 2017-12-24 MED ORDER — NON-SYSTEM MEDICATION *A*
99 refills | Status: DC
Start: 2017-12-24 — End: 2018-11-16

## 2017-12-24 MED ORDER — DISPOSABLE GLOVES MISC *A*
99 refills | Status: DC
Start: 2017-12-24 — End: 2024-05-16

## 2017-12-24 NOTE — Telephone Encounter (Signed)
RX were faxed back to [facility]

## 2017-12-30 ENCOUNTER — Encounter: Payer: MEDICAID | Admitting: Surgery

## 2018-01-12 ENCOUNTER — Encounter: Payer: Self-pay | Admitting: Gastroenterology

## 2018-01-12 ENCOUNTER — Ambulatory Visit: Payer: Medicaid Other | Admitting: Student in an Organized Health Care Education/Training Program

## 2018-01-13 ENCOUNTER — Encounter: Payer: Self-pay | Admitting: Student in an Organized Health Care Education/Training Program

## 2018-01-13 ENCOUNTER — Ambulatory Visit
Payer: MEDICAID | Attending: Internal Medicine | Admitting: Student in an Organized Health Care Education/Training Program

## 2018-01-13 VITALS — BP 140/94 | HR 84 | Temp 95.5°F | Ht 65.0 in | Wt 164.0 lb

## 2018-01-13 DIAGNOSIS — N319 Neuromuscular dysfunction of bladder, unspecified: Secondary | ICD-10-CM

## 2018-01-13 DIAGNOSIS — K5900 Constipation, unspecified: Secondary | ICD-10-CM

## 2018-01-13 MED ORDER — POLYETHYLENE GLYCOL 3350 PO POWD *I*
17.0000 g | Freq: Every day | ORAL | 5 refills | Status: DC
Start: 2018-01-13 — End: 2018-08-30

## 2018-01-13 NOTE — Progress Notes (Signed)
[facility] Progress Note    Subjective:       Cindy Ochoa is a 32 y.o. year old female with PMHx of quadriparesis, autonomic dysfunctionm, chronic pressure ulcers, and neurogenic bladder w/ urostomy, presenting today for constipation.     Patient reports having a good holiday  Reporting constipation since December  Used to have BM every other day  Now having BM once a week  When she has BM, it is hard and difficult to pass  Has not been drinking enough fluids  No change in diet  Adequate PO, no change in weight   Has been using peri-colace once a day and suppository at times    Has a urostomy bag  Feels there is a leak and needs appointment with Urology  She plants to call to follow-up    Medications reviewed and reconciled.   Current Outpatient Prescriptions   Medication Sig    Non-System Medication Urostomy drainage bags 2000 cc change as needed. Dx N39.46 and  G82.54    Ostomy Supplies [facility] [employer] Urostomy two piece bag and wafer 1 1/4 "H  7/8 "V N31.9 Neuromuscular dysfunction of the bladder. Use as directed.    disposable underpads 30"x36" (CHUX) Use 6 times daily and PRN. 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 Dx N39.46 and  G82.54    Ostomy Supplies MISC [employer] Urostomy two piece bag and wafer 1 1/4 "H  7/8 "V N31.9 Neuromuscular dysfunction of the bladder    Ostomy Supplies Pouch MISC [employer] Urostomy two piece bag and wafer 1 1/4 "H  7/8 "V N31.9 Neuromuscular dysfunction of the bladder Use as directed    Ostomy Supplies MISC Barrier ring. Use as directed.    incontinence supply disposable Large pull ups - use up to 5 x daily  Dx N39.46    disposable gloves 1 box Dynarex PF Vinyl Gloves    Gauze Pads & Dressings (ABDOMINAL PAD) 8"X10" PADS By 1 each no specified route daily   Cover buttock wounds 2x daily    Adhesive Tape (MEDIPORE  SURGICAL 2"X10YD) TAPE Secure the 2 buttocks  wound dressings 2x day    dantrolene (DANTRIUM) 100 MG capsule Take 1 capsule (100 mg total) by mouth 2 times daily    baclofen (LIORESAL) 10 MG tablet Take 1 tablet (10 mg total) by mouth 3 times daily    Wound Dressings (HYDROFERA BLUE 4"X4") PADS Cut and moisten with saline as directed and pack in to buttock wound daily    Non-System Medication Gel overlay mattress for hospital bed - diagnosis G82.54, L89.93, L89.159    docusate sodium (COLACE) 100 MG capsule Take 1 capsule (100 mg total) by mouth 2 times daily    bisacodyl (BISAC-EVAC) 10 MG suppository Place 1 suppository (10 mg total) rectally daily as needed    acetaminophen (MAPAP) 500 mg tablet Take 1 tablet (500 mg total) by mouth every 4-6 hours as needed   for pain    senna-docusate (PERICOLACE) 8.6-50 MG per tablet Take 2 tablets by mouth daily as needed for Constipation    albuterol HFA 108 (90 Base) MCG/ACT inhaler Inhale 1-2 puffs into the lungs every 6 hours as needed   Shake well before each use.    Non-System Medication Easy Tip Leg Bags 1000mg  - diagnosis G82.53 N39.46    etonogestrel (NEXPLANON) 68 MG  IMPL Inject 68 mg into the skin once   Placed 10/20/16    generic DME Use as directed. Hospital bed with Low air loss mattress. Duration of use: 1 year.    Non-System Medication Hospital Bed with Air Mattress - Diagnosis G82.54, L89.93,L89.159  Medicaid [id number]         Physical Exam:     Vitals:    01/13/18 1321   BP: (!) 140/94   Pulse: 84   Temp: 35.3 C (95.5 F)   TempSrc: Temporal   Weight: 74.4 kg (164 lb)   Height: 1.651 m (5\' 5" )     Wt Readings from Last 3 Encounters:   01/13/18 74.4 kg (164 lb)   11/05/17 74.4 kg (164 lb)   09/23/17 74.4 kg (164 lb)     BP Readings from Last 3 Encounters:   01/13/18 (!) 140/94   11/05/17 113/62   09/23/17 92/53       General: Pleasant, In no apparent distress, wheelchair bound  Pulmonary: CTA B/L. No wheezes, rhonchi, or rales.  Cardiovascular:  RRR. No murmurs, rubs, or gallops.   Abdominal: Soft. Non-distended. Non-tender. Bowel sounds present.  Extremities: No peripheral edema.  Neuro: CN II - XII grossly intact.       Assessment and Plan:     1. Constipation  - Increase water intake, 4-6 glasses of water daily   - Increase peri-colace 2 tabs twice daily  - Add miralax daily   - Use suppository daily   - Can de-escalate bowel regimen with more frequent bowel movements  - If still constipated, will call so bowel regimen can be adjusted.Add magnesium citrate    2. Neurogenic bladder s/p urostomy  - Patient will call to follow-up with Urology       Health Maintenance:  - Pap Smear - due 09/2020  - Immunizations - up to date  - Hgb A1c - none  - Depression screen- completed today      Follow up in 4 months.       Dyke Brackett, MD  Internal Medicine PGY-3

## 2018-01-13 NOTE — Patient Instructions (Signed)
You can try prune juice to help move your bowels  Please increase pericolace to 2 tablets twice a day   Start taking miralax with a full glass of water once a day. You can increase to twice daily   Use the suppository daily.   Keep hydrated with 4-6 glasses of water (8 ounces)

## 2018-01-14 ENCOUNTER — Ambulatory Visit: Payer: MEDICAID | Attending: Internal Medicine | Admitting: Surgery

## 2018-01-14 ENCOUNTER — Encounter: Payer: Self-pay | Admitting: Surgery

## 2018-01-14 VITALS — BP 118/73 | HR 88 | Temp 97.9°F | Resp 18 | Ht 65.0 in | Wt 172.0 lb

## 2018-01-14 DIAGNOSIS — L89153 Pressure ulcer of sacral region, stage 3: Secondary | ICD-10-CM | POA: Insufficient documentation

## 2018-01-14 MED ORDER — LIDOCAINE HCL 4 % EX SOLN *I*
CUTANEOUS | Status: AC | PRN
Start: 2018-01-14 — End: 2018-03-15
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
## 2018-01-14 NOTE — Patient Instructions (Addendum)
Please follow these instructions about how to care for your wound.    .          Wound Location Dressing Orders Frequency of Dressing Change    Location: Sacral                                      Location: Right ischium Calmoseptine to peri wound  Use cavilon wipe on the skin anywhere where the adhesive may touch  Cut square of hydrafera blue into 3 strips  Wet one strip before each dressing change (save the other 2 into a baggie)  Ring out excess water  Pack wound with damp hydrafera blue, leave a small tail hanging out  Cover with ABD pad  Tape  *NEW*You may use just Abd pad and tape on the days that the wound is drier*      Calmoseptine around wound  abd pad to cover      Vitamin a+d to dry areas or vaseline on butt     [x] When the hydrafera blue changes white (instead of blue), you should change the dressing  You can change the abd pad as often as necessary                              Daily     Cleanse Wound(s) with:    [x] Soap &Water with dressing changes, keep dressing dry in between       [x] Leg Elevation: Elevate leg(s) to level of heart   or above for 30 minutes 3 times per day.  [x] Right   Leg [x] Left   Leg       [x] Assistive Device(s): [x] Wheelchair    [x] Pressure Reduction: [x] Wheelchair Cushion   [x] Specialty Bed [x] Reposition every 2 hours   [x] Limit time up in your chair to 2 hours or less at a time      [x] General: [x] Stop / Decrease Smoking        [] Discharge from [facility] Discharge Instructions    Following the instructions below will give you the best opportunity for wound healing.    Wound Care Instructions (If changing own dressing):  Gather all supplies you will need to change your dressing.  Before changing your dressing, wash your hands for at least 15 seconds with warm soapy water.  Rinse  off all soap, then dry with a towel.  Remove the old dressing. Wash hands again before applying the new dressing.    If you experience any of the following during our business hours of 8 AM - 4:30 PM, Monday - Friday, please call the [facility] at [phone]:    ?Increase in pain ?Temperature over 101F ?Drainage with a foul odor ?Bleeding ?Increase in swelling ?Increase in drainage from your wound ?Need for compression bandage changes (slippage, breakthrough drainage)    Please contact your primary care physician or proceed to the nearest emergency room if you experience any of the above after our business hours.     Please note your appointment(s) above - if you are unable to keep, kindly give 24 hours' notice. Thank You!

## 2018-01-19 ENCOUNTER — Ambulatory Visit: Payer: MEDICAID | Admitting: Physical Medicine and Rehabilitation

## 2018-01-26 ENCOUNTER — Telehealth: Payer: Self-pay | Admitting: Internal Medicine

## 2018-01-26 NOTE — Telephone Encounter (Signed)
Called and spoke with patient:     Pt states that she noticed two bumps by her ear about 4-5 days ago.   Pt states that these have increased in size since she first noticed them and are now each about the size of a dime.   Pt denies redness, red streaks, drainage, fever.   Pt would like to have these looked at - requesting referral to derm.     Pt admits that she had a cold with runny nose around the time the bumps first began - her son had a cold then too.   On discussion, sounds as though the bumps are near the tragus in front of ear.     Recommended evaluation here as first step.   Pt agreeable, requests appointment for tomorrow, as that is when she'll have transportation.   Scheduled as requested, 9:30 AM in [facility], no team availability tomorrow.

## 2018-01-26 NOTE — Telephone Encounter (Signed)
Patient is requesting an referral be sent to Dermatology    Patient stated she has 2 weird lumps by left ear     Patients stated the lumps are the size of an dime    Patient stated this symptom has been going on for 4-5 days    Patient can be reached at  [phone]

## 2018-01-27 ENCOUNTER — Ambulatory Visit: Payer: MEDICAID

## 2018-01-27 ENCOUNTER — Telehealth: Payer: Self-pay

## 2018-01-27 NOTE — Telephone Encounter (Signed)
Writer attempted to reschedule 1/23 N/S SCI appointment. No answer, LVM w/ number.  \

## 2018-01-28 ENCOUNTER — Emergency Department
Admission: EM | Admit: 2018-01-28 | Discharge: 2018-01-29 | Disposition: A | Discharge status: Home / Self Care | Payer: MEDICAID | Source: Ambulatory Visit | Attending: Emergency Medicine | Admitting: Emergency Medicine

## 2018-01-28 DIAGNOSIS — N3000 Acute cystitis without hematuria: Secondary | ICD-10-CM | POA: Insufficient documentation

## 2018-01-28 DIAGNOSIS — Z87440 Personal history of urinary (tract) infections: Secondary | ICD-10-CM | POA: Insufficient documentation

## 2018-01-28 MED ORDER — ONDANSETRON HCL 2 MG/ML IV SOLN *I*
4.0000 mg | Freq: Once | INTRAMUSCULAR | Status: DC
Start: 2018-01-28 — End: 2018-01-29

## 2018-01-28 MED ORDER — SODIUM CHLORIDE 0.9 % IV BOLUS *I*
1000.0000 mL | Freq: Once | Status: AC
Start: 2018-01-28 — End: 2018-01-29
  Administered 2018-01-29: 1000 mL via INTRAVENOUS

## 2018-01-28 NOTE — ED Triage Notes (Signed)
Pt coming from home - electric wheelchair bound with N/V/D since Monday, head congestion, productive cough with sputum coming out of old trach area. Mask applied in triage.        Triage Note   Loel Ro, RN

## 2018-01-28 NOTE — First Provider Contact (Signed)
ED First Provider Contact Note    Initial provider evaluation performed by   ED First Provider Contact     Date/Time Event User Comments    01/28/18 2101 ED First Provider Contact Jennette Dubin Initial Face to Face Provider Contact      patient states he has n/v/d for the last 2 -3 days and states she has stopped trying to eat as she states she cant keep anything down.    Orders placed:  LABS     Patient requires further evaluation.     Jennette Dubin, NP, 01/28/2018, 9:01 PM     Jennette Dubin, NP  01/28/18 2104

## 2018-01-29 ENCOUNTER — Emergency Department: Payer: MEDICAID

## 2018-01-29 ENCOUNTER — Encounter: Payer: Self-pay | Admitting: Student in an Organized Health Care Education/Training Program

## 2018-01-29 DIAGNOSIS — J029 Acute pharyngitis, unspecified: Secondary | ICD-10-CM

## 2018-01-29 DIAGNOSIS — R0600 Dyspnea, unspecified: Secondary | ICD-10-CM

## 2018-01-29 DIAGNOSIS — N3000 Acute cystitis without hematuria: Secondary | ICD-10-CM

## 2018-01-29 DIAGNOSIS — R197 Diarrhea, unspecified: Secondary | ICD-10-CM

## 2018-01-29 DIAGNOSIS — R05 Cough: Secondary | ICD-10-CM

## 2018-01-29 DIAGNOSIS — H9201 Otalgia, right ear: Secondary | ICD-10-CM

## 2018-01-29 LAB — PLASMA PROF 7 (ED ONLY)
Anion Gap,PL: 15 (ref 7–16)
CO2,Plasma: 20 mmol/L (ref 20–28)
Chloride,Plasma: 105 mmol/L (ref 96–108)
Creatinine: 0.51 mg/dL (ref 0.51–0.95)
GFR,Black: 147 *
GFR,Caucasian: 127 *
Glucose,Plasma: 118 mg/dL — ABNORMAL HIGH (ref 60–99)
Potassium,Plasma: 3.8 mmol/L (ref 3.4–4.7)
Sodium,Plasma: 140 mmol/L (ref 133–145)
UN,Plasma: 12 mg/dL (ref 6–20)

## 2018-01-29 LAB — CBC AND DIFFERENTIAL
Baso # K/uL: 0 10*3/uL (ref 0.0–0.1)
Basophil %: 0.3 %
Eos # K/uL: 0.1 10*3/uL (ref 0.0–0.4)
Eosinophil %: 1.1 %
Hematocrit: 41 % (ref 34–45)
Hemoglobin: 13.3 g/dL (ref 11.2–15.7)
IMM Granulocytes #: 0 10*3/uL
IMM Granulocytes: 0.2 %
Lymph # K/uL: 2.1 10*3/uL (ref 1.2–3.7)
Lymphocyte %: 33.2 %
MCH: 31 pg/cell (ref 26–32)
MCHC: 32 g/dL (ref 32–36)
MCV: 95 fL (ref 79–95)
Mono # K/uL: 0.7 10*3/uL (ref 0.2–0.9)
Monocyte %: 11.2 %
Neut # K/uL: 3.4 10*3/uL (ref 1.6–6.1)
Nucl RBC # K/uL: 0 10*3/uL (ref 0.0–0.0)
Nucl RBC %: 0 /100 WBC (ref 0.0–0.2)
Platelets: 89 10*3/uL — ABNORMAL LOW (ref 160–370)
RBC: 4.4 MIL/uL (ref 3.9–5.2)
RDW: 14.7 % — ABNORMAL HIGH (ref 11.7–14.4)
Seg Neut %: 54 %
WBC: 6.3 10*3/uL (ref 4.0–10.0)

## 2018-01-29 LAB — RUQ PANEL (ED ONLY)
Albumin: 4.1 g/dL (ref 3.5–5.2)
Alk Phos: 72 U/L (ref 35–105)
Amylase: 49 U/L (ref 28–100)
Bilirubin,Direct: <0.2 mg/dL (ref 0.0–0.3)
Bilirubin,Total: 0.3 mg/dL (ref 0.0–1.2)
Lipase: 33 U/L (ref 13–60)
Total Protein: 7.7 g/dL (ref 6.3–7.7)

## 2018-01-29 LAB — URINALYSIS REFLEX TO CULTURE
Ketones, UA: NEGATIVE
Nitrite,UA: POSITIVE — AB
Protein,UA: 100 mg/dL — AB
Specific Gravity,UA: 1.017 (ref 1.002–1.030)
pH,UA: 6 (ref 5.0–8.0)

## 2018-01-29 LAB — INFLUENZA A: Influenza A PCR: 0

## 2018-01-29 LAB — URINE MICROSCOPIC (IQ200)
RBC,UA: 25 /hpf — ABNORMAL HIGH (ref 0–2)
WBC,UA: >180 /hpf — ABNORMAL HIGH (ref 0–5)

## 2018-01-29 LAB — RSV PCR: RSV PCR: 0

## 2018-01-29 LAB — INFLUENZA B PCR: Influenza B PCR: 0

## 2018-01-29 LAB — CK: CK: 116 U/L (ref 26–192)

## 2018-01-29 MED ORDER — SODIUM CHLORIDE 0.9 % IV SOLN WRAPPED *I*
150.0000 mL/h | Status: DC
Start: 2018-01-29 — End: 2018-01-29
  Administered 2018-01-29 (×3): 150 mL/h via INTRAVENOUS

## 2018-01-29 MED ORDER — ACETAMINOPHEN 500 MG PO TABS *I*
1000.0000 mg | ORAL_TABLET | Freq: Once | ORAL | Status: AC
Start: 2018-01-29 — End: 2018-01-29
  Administered 2018-01-29: 1000 mg via ORAL
  Filled 2018-01-29: qty 2

## 2018-01-29 MED ORDER — CEPHALEXIN 500 MG PO CAPS *I*
500.0000 mg | ORAL_CAPSULE | Freq: Once | ORAL | Status: AC
Start: 2018-01-29 — End: 2018-01-29
  Administered 2018-01-29: 500 mg via ORAL
  Filled 2018-01-29: qty 1

## 2018-01-29 MED ORDER — CEPHALEXIN 500 MG PO CAPS *I*
500.0000 mg | ORAL_CAPSULE | Freq: Four times a day (QID) | ORAL | 0 refills | Status: AC
Start: 2018-01-29 — End: 2018-02-08

## 2018-01-29 NOTE — ED Notes (Signed)
Pt. Ambulatory for Discharge. VSS and pt. Safe for D/C. IV site removed from pt. D/C teaching and follow-up care reviewed. Pt. Verbalizes understanding.
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
## 2018-01-29 NOTE — ED Notes (Signed)
Pt assisted into stretcher from motorized wheelchair via [employer]. Pt tolerated well. Boosted for comfort, sister at bedside. Pt & sister cooperative and appropriate at this time. PIV placed, labs obtained and sent. Will continue to monitor, assess, and treat per provider orders.

## 2018-01-29 NOTE — ED Notes (Signed)
Pt in hallway by doors to ED WR and verticare. Patient yelling and using profanities at other patients and family members in waiting room. Writer approached patient to attempt to de-escalate. DPS with patient at this time using calming language to attempt to de-escalate as well. Pt continues to yell and use profanities with DPS, Probation officer, and other patients and their family members. DPS informed patient of need to reduce her voice and language. No learning by patient observed; pt continues to yell.
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
## 2018-01-29 NOTE — Discharge Instructions (Signed)
You were seen here in the Emergency Department for UTI. The remainder of your physical exam was reassuring and you were felt safe to go home.    You were provided with a prescription for keflex, which you should take only as prescribed.     Please follow up with your primary care provider within the next 3 days. Call first thing in the morning for an appointment and let them know that you were seen in the emergency department today.     Always remember you can return to the Emergency Department night or day for significant issues. Please return if you experience any fever >101F, chills, worsening pain not controlled with medications, chest pain, shortness of breath, nausea, vomiting, or any other worsening or concerning symptoms.     Please read the attached pamphlet for more information about your diagnosis and treatment.

## 2018-01-29 NOTE — ED Notes (Signed)
Assumed care of patient. Visualized, in no acute distress. Will continue to monitor and treat per orders.

## 2018-01-29 NOTE — ED Provider Progress Notes (Addendum)
ED Provider Progress Note    Pt is hemodynamically stable.  Resting peacefully.  Labs reassuring, negative for Influenze A/B and RSV.  CXR negative for acute cardiopulmonary disease.  Awaiting urinalysis results - pt has history of recurrent UTIs.    Sign out given at 7am to new ED staff.        Arman Bogus, MD, 01/29/2018, 4:57 AM   Anesthesiology, PGY1     Arman Bogus, MD  Resident  01/29/18 0730       Arman Bogus, MD  Resident  01/29/18 0730       Arman Bogus, MD  Resident  01/29/18 [phone]

## 2018-01-31 ENCOUNTER — Ambulatory Visit: Payer: MEDICAID | Admitting: Ophthalmology

## 2018-02-01 LAB — AEROBIC CULTURE

## 2018-02-04 ENCOUNTER — Ambulatory Visit: Payer: MEDICAID | Admitting: Internal Medicine

## 2018-02-08 ENCOUNTER — Telehealth: Payer: Self-pay

## 2018-02-08 NOTE — Telephone Encounter (Signed)
Left message for patient to call our office. I have canceled patient appointment on 02/10/2018, due to patient being scheduled incorrectly. Thank You

## 2018-02-10 ENCOUNTER — Ambulatory Visit: Payer: MEDICAID | Admitting: Urology

## 2018-02-28 ENCOUNTER — Encounter: Payer: Self-pay | Admitting: Internal Medicine

## 2018-03-10 ENCOUNTER — Ambulatory Visit: Payer: MEDICAID | Admitting: Student in an Organized Health Care Education/Training Program

## 2018-03-16 ENCOUNTER — Emergency Department: Payer: MEDICAID

## 2018-03-16 ENCOUNTER — Other Ambulatory Visit: Payer: Self-pay | Admitting: Cardiology

## 2018-03-16 ENCOUNTER — Encounter: Payer: MEDICAID | Admitting: Cardiology

## 2018-03-16 ENCOUNTER — Encounter: Payer: Self-pay | Admitting: Student in an Organized Health Care Education/Training Program

## 2018-03-16 ENCOUNTER — Inpatient Hospital Stay
Admission: EM | Admit: 2018-03-16 | Discharge: 2018-03-18 | DRG: 720 | Disposition: A | Discharge status: Home / Self Care | Payer: MEDICAID | Source: Ambulatory Visit | Attending: Internal Medicine | Admitting: Internal Medicine

## 2018-03-16 DIAGNOSIS — R011 Cardiac murmur, unspecified: Secondary | ICD-10-CM

## 2018-03-16 DIAGNOSIS — R000 Tachycardia, unspecified: Secondary | ICD-10-CM

## 2018-03-16 DIAGNOSIS — L89153 Pressure ulcer of sacral region, stage 3: Secondary | ICD-10-CM

## 2018-03-16 DIAGNOSIS — F17200 Nicotine dependence, unspecified, uncomplicated: Secondary | ICD-10-CM | POA: Diagnosis present

## 2018-03-16 DIAGNOSIS — A419 Sepsis, unspecified organism: Secondary | ICD-10-CM

## 2018-03-16 DIAGNOSIS — L89214 Pressure ulcer of right hip, stage 4: Secondary | ICD-10-CM | POA: Diagnosis present

## 2018-03-16 DIAGNOSIS — R008 Other abnormalities of heart beat: Secondary | ICD-10-CM

## 2018-03-16 DIAGNOSIS — G8250 Quadriplegia, unspecified: Secondary | ICD-10-CM | POA: Diagnosis present

## 2018-03-16 DIAGNOSIS — K5909 Other constipation: Secondary | ICD-10-CM

## 2018-03-16 DIAGNOSIS — K5900 Constipation, unspecified: Secondary | ICD-10-CM | POA: Diagnosis present

## 2018-03-16 DIAGNOSIS — N390 Urinary tract infection, site not specified: Secondary | ICD-10-CM | POA: Diagnosis present

## 2018-03-16 DIAGNOSIS — D696 Thrombocytopenia, unspecified: Secondary | ICD-10-CM | POA: Diagnosis present

## 2018-03-16 DIAGNOSIS — R509 Fever, unspecified: Secondary | ICD-10-CM

## 2018-03-16 DIAGNOSIS — I471 Supraventricular tachycardia: Secondary | ICD-10-CM | POA: Diagnosis present

## 2018-03-16 LAB — CBC AND DIFFERENTIAL
Baso # K/uL: 0 10*3/uL (ref 0.0–0.1)
Basophil %: 0.3 %
Eos # K/uL: 0 10*3/uL (ref 0.0–0.4)
Eosinophil %: 0.1 %
Hematocrit: 42 % (ref 34–45)
Hemoglobin: 13.7 g/dL (ref 11.2–15.7)
IMM Granulocytes #: 0 10*3/uL
IMM Granulocytes: 0.4 %
Lymph # K/uL: 1.6 10*3/uL (ref 1.2–3.7)
Lymphocyte %: 21.7 %
MCH: 31 pg/cell (ref 26–32)
MCHC: 33 g/dL (ref 32–36)
MCV: 95 fL (ref 79–95)
Mono # K/uL: 0.5 10*3/uL (ref 0.2–0.9)
Monocyte %: 6.9 %
Neut # K/uL: 5.2 10*3/uL (ref 1.6–6.1)
Nucl RBC # K/uL: 0 10*3/uL (ref 0.0–0.0)
Nucl RBC %: 0 /100 WBC (ref 0.0–0.2)
Platelets: 108 10*3/uL — ABNORMAL LOW (ref 160–370)
RBC: 4.4 MIL/uL (ref 3.9–5.2)
RDW: 15.7 % — ABNORMAL HIGH (ref 11.7–14.4)
Seg Neut %: 70.6 %
WBC: 7.4 10*3/uL (ref 4.0–10.0)

## 2018-03-16 LAB — RSV PCR: RSV PCR: 0

## 2018-03-16 LAB — RUQ PANEL (ED ONLY)
ALT: 18 U/L (ref 0–35)
Albumin: 4.3 g/dL (ref 3.5–5.2)
Alk Phos: 67 U/L (ref 35–105)
Amylase: 46 U/L (ref 28–100)
Bilirubin,Direct: <0.2 mg/dL (ref 0.0–0.3)
Bilirubin,Total: 0.3 mg/dL (ref 0.0–1.2)
Lipase: 34 U/L (ref 13–60)
Total Protein: 8 g/dL — ABNORMAL HIGH (ref 6.3–7.7)

## 2018-03-16 LAB — LACTATE, PLASMA: Lactate: 1.9 mmol/L (ref 0.5–2.2)

## 2018-03-16 LAB — PLASMA PROF 7 (ED ONLY)
Anion Gap,PL: 15 (ref 7–16)
CO2,Plasma: 20 mmol/L (ref 20–28)
Chloride,Plasma: 106 mmol/L (ref 96–108)
Creatinine: 0.48 mg/dL — ABNORMAL LOW (ref 0.51–0.95)
GFR,Black: 149 *
GFR,Caucasian: 130 *
Glucose,Plasma: 96 mg/dL (ref 60–99)
Sodium,Plasma: 141 mmol/L (ref 133–145)
UN,Plasma: 10 mg/dL (ref 6–20)

## 2018-03-16 LAB — INFLUENZA A: Influenza A PCR: 0

## 2018-03-16 LAB — INFLUENZA B PCR: Influenza B PCR: 0

## 2018-03-16 MED ORDER — SENNOSIDES 8.6 MG PO TABS *I*
1.0000 | ORAL_TABLET | Freq: Every evening | ORAL | Status: DC
Start: 2018-03-16 — End: 2018-03-19
  Administered 2018-03-16 – 2018-03-17 (×2): 1 via ORAL
  Filled 2018-03-16 (×2): qty 1

## 2018-03-16 MED ORDER — SODIUM CHLORIDE 0.9 % IV BOLUS *I*
1000.0000 mL | Freq: Once | Status: AC
Start: 2018-03-16 — End: 2018-03-16
  Administered 2018-03-16: 1000 mL via INTRAVENOUS

## 2018-03-16 MED ORDER — NICOTINE 14 MG/24HR TD PT24 *I*
1.0000 | MEDICATED_PATCH | Freq: Every day | TRANSDERMAL | Status: DC
Start: 2018-03-16 — End: 2018-03-19
  Administered 2018-03-16 – 2018-03-18 (×3): 1 via TRANSDERMAL
  Filled 2018-03-16 (×3): qty 1

## 2018-03-16 MED ORDER — BISACODYL 10 MG RE SUPP *I*
10.0000 mg | Freq: Every day | RECTAL | Status: DC | PRN
Start: 2018-03-16 — End: 2018-03-19
  Administered 2018-03-17: 10 mg via RECTAL

## 2018-03-16 MED ORDER — DOCUSATE SODIUM 100 MG PO CAPS *I*
100.0000 mg | ORAL_CAPSULE | Freq: Two times a day (BID) | ORAL | Status: DC
Start: 2018-03-16 — End: 2018-03-19
  Administered 2018-03-16 – 2018-03-17 (×3): 100 mg via ORAL
  Filled 2018-03-16 (×4): qty 1

## 2018-03-16 MED ORDER — ACETAMINOPHEN 500 MG PO TABS *I*
1000.0000 mg | ORAL_TABLET | Freq: Once | ORAL | Status: AC
Start: 2018-03-16 — End: 2018-03-16
  Administered 2018-03-16: 1000 mg via ORAL
  Filled 2018-03-16: qty 2

## 2018-03-16 MED ORDER — CEFTRIAXONE SODIUM 1 G IN STERILE WATER 10ML SYRINGE *I*
1000.0000 mg | Freq: Once | INTRAVENOUS | Status: AC
Start: 2018-03-16 — End: 2018-03-16
  Administered 2018-03-16: 1000 mg via INTRAVENOUS
  Filled 2018-03-16: qty 10

## 2018-03-16 MED ORDER — ACETAMINOPHEN 325 MG PO TABS *I*
650.0000 mg | ORAL_TABLET | ORAL | Status: DC | PRN
Start: 2018-03-16 — End: 2018-03-19
  Administered 2018-03-16 (×2): 650 mg via ORAL
  Filled 2018-03-16 (×2): qty 2
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
## 2018-03-16 NOTE — ED Notes (Signed)
Urine bag and gravity bag exchanged out with patients supplies.
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
## 2018-03-16 NOTE — ED Notes (Signed)
Per provider will treat for UTI but do not need to obtain sample for it would be contaminated  has urostomy

## 2018-03-16 NOTE — ED Notes (Signed)
Patient refusing more iv sticks for 2nd BC. Antibiotic given. See MAR. Will notify provider.

## 2018-03-16 NOTE — ED Triage Notes (Signed)
Around 0710 patient started with tremors and diaphoretic, quadriplegic. Found to be in SVT 170's. EMS able to have patient vagal with success. Placed on 2L NC was sating 91%. Patients foley appears dark and cloudy recently seen here for evaluation of UTI. EKG in triage.        Triage Note   Sherlynn Carbon, RN
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
## 2018-03-16 NOTE — ED Notes (Signed)
Patient stated quadriplegic with urostomy conduit RLQ lives at home with nursing services. C/o fever an stated "she believes she has UTI." Has had frequent UTI.

## 2018-03-16 NOTE — Progress Notes (Signed)
Met with patient to answer questions re: home care services.  Patient currently has aide services through [facility] program.  Patient asking how to have hours increased.  Explained that this is insurance driven - would need to have case worker set up in home assessment to assess need for increased aide service.  Medicaid would then need to approve increase in hours.  She verbalized understanding of this.    Barrett Henle, RN [facility] Acute Care Coordinator [phone], Weekend pager [phone]
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
## 2018-03-16 NOTE — Progress Notes (Signed)
SW contacted to meet with patient regarding home services, transportation, and contacting her son's school. Writer met with patient, who states she has been trying to contact her son on his cell phone but he keeps hanging up on her. She needs to let him know his dad will pick him up at home after school because she will be staying in the hospital. Writer assisted patient with obtaining the phone number for patient's school and she called to inform them. Writer also discussed home services- will have [place] speak with patient about home services, and discussed transportation. Patient states she has an accessible [employer] but does not have someone to drive it to appointments for her and will need to start utilizing medicaid transportation again, which she has not used in 4-5 years. Writer discussed that patient will likely need PCP to fill out transportation abilities form before she can utilize transportation again. Writer provided patient with contact information to [employer] to discuss what will be needed. Patient declined assistance with calling [employer] at this time.    Chanda Busing, [place]

## 2018-03-16 NOTE — ED Notes (Signed)
Patient refused 2nd culture. 4 nurses attempted. IV antiobotics started . Patient noted to have tremors

## 2018-03-17 LAB — URINALYSIS REFLEX TO CULTURE
Ketones, UA: NEGATIVE
Nitrite,UA: NEGATIVE
Protein,UA: 30 mg/dL — AB
Specific Gravity,UA: 1.01 (ref 1.002–1.030)
pH,UA: 6 (ref 5.0–8.0)

## 2018-03-17 LAB — CBC
Hematocrit: 40 % (ref 34–45)
Hemoglobin: 12.8 g/dL (ref 11.2–15.7)
MCH: 31 pg/cell (ref 26–32)
MCHC: 32 g/dL (ref 32–36)
MCV: 95 fL (ref 79–95)
Platelets: 91 10*3/uL — ABNORMAL LOW (ref 160–370)
RBC: 4.2 MIL/uL (ref 3.9–5.2)
RDW: 15.3 % — ABNORMAL HIGH (ref 11.7–14.4)
WBC: 8.7 10*3/uL (ref 4.0–10.0)

## 2018-03-17 LAB — BASIC METABOLIC PANEL
Anion Gap: 12 (ref 7–16)
CO2: 25 mmol/L (ref 20–28)
Calcium: 9.4 mg/dL (ref 8.8–10.2)
Chloride: 107 mmol/L (ref 96–108)
Creatinine: 0.52 mg/dL (ref 0.51–0.95)
GFR,Black: 146 *
GFR,Caucasian: 126 *
Glucose: 117 mg/dL — ABNORMAL HIGH (ref 60–99)
Lab: 12 mg/dL (ref 6–20)
Potassium: 3.9 mmol/L (ref 3.3–5.1)
Sodium: 144 mmol/L (ref 133–145)

## 2018-03-17 LAB — URINE MICROSCOPIC (IQ200)
RBC,UA: 1 /hpf (ref 0–2)
WBC,UA: 25 /hpf — ABNORMAL HIGH (ref 0–5)

## 2018-03-17 MED ORDER — BACLOFEN 5 MG PO TABS *I*
5.0000 mg | ORAL_TABLET | Freq: Three times a day (TID) | ORAL | Status: DC
Start: 2018-03-17 — End: 2018-03-19
  Administered 2018-03-17 (×3): 5 mg via ORAL
  Filled 2018-03-17 (×7): qty 1

## 2018-03-17 MED ORDER — CEFTRIAXONE SODIUM 1 G IN STERILE WATER 10ML SYRINGE *I*
1000.0000 mg | INTRAVENOUS | Status: DC
Start: 2018-03-17 — End: 2018-03-19
  Administered 2018-03-17 – 2018-03-18 (×2): 1000 mg via INTRAVENOUS
  Filled 2018-03-17 (×3): qty 10

## 2018-03-17 MED ORDER — DANTROLENE SODIUM 25 MG PO CAPS *I*
25.0000 mg | ORAL_CAPSULE | Freq: Once | ORAL | Status: AC
Start: 2018-03-17 — End: 2018-03-17
  Administered 2018-03-17: 25 mg via ORAL
  Filled 2018-03-17: qty 1

## 2018-03-17 NOTE — Consults (Addendum)
Wound Consult Note, Initial    Patient Unit: [id number]    [name]  32 y.o. year old female was referred by the patient's primary team for evaluation and treatment recommendations regarding: coccyx.   HPI, PMH/PSH, Labs, MAR reviewed.    Wound first identified/assessed on admission.    Patient c/o:    []  Pain  []  Burning   []  Itching  []  Odor  [x]  No complaints at this time    Exam    Patient alert, awake, NAD.    Wound #1:   Location: R ischium   Stage: 4  Suspected underlying etiology/wound type: pressure  Measurements:  0.5 x 0.5 x 3 cm  Undermining:  None  Tunneling: None  Wound bed:  Unable to visualize due to size of opening  Drainage:  Small purulent initially, serosanguinous after cleansing  Edges:  closed  Periwound skin:  macerated   Odor after cleansing: None     Sacrum/coccyx wound has closed. There is a deep defect, but still is intact but macerated. This was packed with aquacel ag for moisture management     Ostomy supplies have been ordered from hospital stores:  2-1/4 flat wafers (cut to approx. 1-1/4" opening)  2" barrier rings  Urostomy pouch. There is 1 urostomy adaptor that comes with every box of pouches. If this does not arrive with the pouches, it can be obtained from hospital stores.     Plan  Turn and position every two hours and PRN  LAL mattress  Offloading:  Use pillows for heel offloading  Topical recommendations:     R ischium - Cleanse wound bed with normal saline, pack small opening with a piece of aquacel ag, cover with allevyn gentle.    Change every 3-5 day(s) & PRN   sacrum - Cleanse wound bed with normal saline, Apply cavilon within wound and pack with aquacel ag. Do not apply cover dressing as this will add excess moisture. Perform every other day and PRN.       Thank you very much for consult.  Wound Nurse will follow-up weekly and PRN.    Please call with any questions or concerns.    Francia Greaves RN, [facility]  Pager [phone]

## 2018-03-17 NOTE — Progress Notes (Signed)
Hospital Medicine Service Attending Progress Note    Significant Events/Subjective:   Whole body spasms with sinus tachycardia overnight and this AM, mostly occurring with turning  No fevers, chills, or recurrence of SVT  Patient without specific concerns except for her spasms  No BM this AM    Objective:     Physical Exam  Temp:  [36.3 C (97.3 F)-37 C (98.6 F)] 36.3 C (97.3 F)  Heart Rate:  [93-129] 94  Resp:  [18] 18  BP: (90-168)/(56-102) 140/82  78.7 kg (173 lb 6.4 oz)    Constitutional: alert female lying in bed, interactive and in no distress   CV: RRR with S4 gallop unchanged from prior exam  Resp: normal work of breathing, CTA anterolaterally  GI: soft, distended, nontender, +BS, urostomy site intact  MSK/Neuro: b/l hand contractures unchanged    Recent Lab, Micro, and Imaging Studies   Personally reviewed and notable for:  WBC 8.7, plts 91, H&H 12.8/40  BMP WNL    Urine cx pending  UA with 25 WBC, 3+ LE, 1+ bacteria    Current Inpatient Medications:   ceftriaxone  1,000 mg Intravenous Q24H    baclofen  5 mg Oral TID    docusate sodium  100 mg Oral BID    senna  1 tablet Oral Nightly    nicotine  1 patch Transdermal Daily     acetaminophen, bisacodyl    Assessment:   32 year old female with PMHx of C6 spinal cord injury with quadriplegia, neurogenic bladder with ileal loop urostomy, nephrolithiasis, recurrent UTIs, and chronic sacral pressure ulcer presenting with increased spasms, dyspnea, fever/chills, and transient SVT resolving with vagal maneuvers. Suspect sepsis as cause of symptoms from presumed urinary source although it is possible that constipation with autonomic dysfunction played a role. Sacral and ischial ulcers potential source but neither site appeared acutely infected on presentation.     Plans:   Sepsis with presumed urinary source: afebrile with improvement in majority of symptoms  -Continue empiric Ceftriaxone awaiting urine cx data    SVT: likely related to fever/sepsis,  resolved with vagal maneuver  -Await TTE to evaluate for structural abnormalities given S4 gallop on exam  -Monitor on telemetry overnight  -No indication to start beta-blocker at this point as SVT likely provoked and self resolved    Sacral and ischial pressure ulcers:   -low air loss mattress  -turn and position q2hrs  -wound care consult appreciated    Constipation: Colace, Senna, Bisacodyl per home bowel regimen    Mild thrombocytopenia: may be secondary to sepsis, but value slightly downtrending  -Patient with possible hx of HIT so avoiding Heparin products    Nicotine dependence: Nicotine patch    Quadriplegia with muscle spasms:   -After discussion with her PCP, will resume baclofen 5mg  tid; okay to give additional 5mg  doses prn for spasms if necessary    DVT PPx: IPCs    Code Status: FULL    Discharge Planning:   Est. Discharge Date (EDD): 3/22  Discharge Criteria/Barriers to Discharge: Transition to oral antibiotic regimen  PT and/or OT Recommendations/Discharge to: Home with aide services  Appointments Needed with: PCP, Wound care clinic    Case discussed with housestaff team    Shuna Tabor, MD on 03/17/2018 at 6:36 PM

## 2018-03-17 NOTE — Student Note (Addendum)
Hospital Medicine Progress Note    LOS: 1 day                                                Significant 24 Hour Events      Moved to [id number].  Received 25 mg dantrolene for painful spasms.  BM this morning ~11AM.    Subjective      This morning Cindy Ochoa reports that her spasms are severe and she felt warm overnight (despite normal vitals). Her sister came to visit and brought a fan for her. Cindy Ochoa is asking for an air mattress.    Objective      Vitals:  Temp:  [36.8 C (98.2 F)-39.1 C (102.4 F)] 36.8 C (98.2 F)  Heart Rate:  [90-117] 108  Resp:  [14-26] 18  BP: (104-168)/(62-102) 168/102  I/O:    Intake/Output Summary (Last 24 hours) at 03/17/18 0800  Last data filed at 03/17/18 0559   Gross per 24 hour   Intake              360 ml   Output              600 ml   Net             -240 ml       Physical Exam  General Constitutional: Laying in bed, NAD. Initially she is asleep, but upon waking her lower extremities begin rhythmically shaking with high frequency and increasing amplitude, causing her whole body and the entire bed to shake. She describes the current shaking as "frustrating" but not explicitly painful.  HEENT: Normocephalic.  Cardiovascular: Unable to assess due to severe shaking.  Pulmonary: Lung sounds clear.  Gastrointestinal: Distended. Unable to hear bowel sounds due to severe shaking. Non-tender to palpation.  Musculoskeletal: Moves upper extremities, contractures in hands. Lower extremities with plantarflexed foot contractures and severe shaking.  Skin: Examination of decubital ulcers deferred.     Laboratory Data:    Recent Labs  Lab 03/16/18  1021   WBC 7.4   Hematocrit 42   Hemoglobin 13.7   Platelets 108*   No results for input(s): PTI, INR, PTT in the last 168 hours.    Lab results: 03/16/18  1021 01/29/18  0111 09/23/17  0935 06/07/17  1104 05/22/17  1524   Sodium  --   --   --  145  --    Potassium  --   --  CANCELED 3.6 CANCELED   Chloride  --   --   --  106  --    CO2  --   --   --  24   --    UN  --   --   --  9  --    Creatinine 0.48* 0.51 0.40* 0.39*  --    GFR,Caucasian  --   --   --  140  --    GFR,Black  --   --   --  161  --    Glucose  --   --   --  89  --    Calcium  --   --   --  10.0  --      No results for input(s): MG, PO4 in the last 168 hours.  Recent Labs  Lab 03/16/18  1021   ALT 18  AST CANCELED   Alk Phos 67     Recent Labs  Lab 03/16/18  1021   Total Protein 8.0*   Albumin 4.3   Bilirubin,Direct <0.2     Recent Labs  Lab 03/16/18  1021   Amylase 46   Lipase 34   No results for input(s): PGLU in the last 168 hours.  Radiology impressions (last 3 days):  *chest Standard Frontal And Lateral Views    Result Date: 03/16/2018  No acute cardiopulmonary disease. END OF IMPRESSION [facility] submits this DICOM format image data and final report to the [facility], an independent secure electronic health information exchange, on a reciprocally searchable basis (with patient authorization) for a minimum of 12 months after exam date.      Active Hospital Problems    Diagnosis    Urinary tract infection without hematuria, site unspecified      Resolved Hospital Problems    Diagnosis   No resolved problems to display.     Assessment     This is a case of sepsis with presume urinary source in a 32 y.o. female with quadraparalysis, urostomy for neurogenic bladder, and neurogenic bowel. Currently covering broadly with CTX.    Plan     Infection, presumed UTI: Normal CXR, clean-appearing decubital ulcers, no blood culture growth at 22 hours, WBC normal yesterday. UA showed 3+ LA, neg nitrites, and 2+ blood. Urine sent to culture early this AM. Since patient is unable to report flank or CVA pain, a UTI in this setting should be presumed complicated.  - IV Ceftriaxone 1g/day.  - Awaiting urine culture results to guide antibiotic choice.  - CBC every other day    Muscle spasms: Patient not currently taking baclofen or dantrolene at home. Spasms were her main reason for calling EMS. Received 25  dantrolene last night for painful spasms. Today with severe shaking in lower extremities. Given that [facility] as a reason for acute-on-chronic painful spasms, she should be treated. Dicussed with PCP (Dr. Iva Boop) regarding appropriate pharmacotherapy.    - Start Baclofen 5 mg TID. Can escalate to 10 mg TID if spasms persist.  - Air mattress.     Constipation 2/2 neurogenic bowel: Yesterday was the patient's normally scheduled bowel regimen day. Constipation may contribute to spasms. BM this morning.  - Colace 170m BID.  - Senna 1 tab.  - Bisacodyl suppository this AM and then again every 3 days.  - If no BM on bowel regimen days, can use Miralax.    New S4 murmur: Given SVT per EMS, this warrants further work-up.  - Tele significant only for tachycardia in the AM (patient reports it coincides with spasms that were triggered by turning).   - Awaiting TTE     Stable Medical Issues:  Nicotine dependence  - Nicoderm patch    F: PO  E: BMP every other day  N: PO  Ppx: DVT: ICP (per patient preference)           Bowel: Senna, Colace, Doculax suppository every 3 days.           Pain: tylenol PRN    Dispo: Following medical stability. Has >120 hours of home assistance per week.    Code Status: FULL    CRushie ChestnutBuback CC4 on 03/17/2018 at 8:00 AM  This is a mCareers information officernote. Please refer to attending note for final assessment and plan.

## 2018-03-17 NOTE — Progress Notes (Addendum)
634 Pt. Arrived to unit from ED via stretcher at 1905. Pt. Placed on hover mat and transferred to hospital bed.LALM ordered. VS obtained and pt. Oriented to unit. Two RN skin assessment completed with Jacques Navy. Pt. Has a dressing on a pre-existing pressure ulcer on sacrum. There is another area OTA to the right that needs to be assessed. Wound eval. Ordered. Pt. Resting comfortably. Personal belongings at bedside.

## 2018-03-17 NOTE — Progress Notes (Signed)
03/17/18 1400   [facility] Patient Class Review   Patient Class Review Inpatient   Patient Class Effective 03/16/18    Slayter Moorhouse M. Cass Edinger, RN  Utilization Management  [phone]  [phone]  Pager:[phone]

## 2018-03-18 ENCOUNTER — Inpatient Hospital Stay: Payer: MEDICAID

## 2018-03-18 DIAGNOSIS — R011 Cardiac murmur, unspecified: Secondary | ICD-10-CM

## 2018-03-18 LAB — ECHO COMPLETE
Aortic Diameter (mid tubular): 2.5 cm
Aortic Diameter (sinus of Valsalva): 2.8 cm
BMI: 28.9 kg/m2
BP Diastolic: 73 mmHg
BP Systolic: 107 mmHg
BSA: 1.9 m2
Deceleration Time - MV: 149 ms
E/A ratio: 0.79
Heart Rate: 102 {beats}/min
Height: 65 in
LA Diameter BSA Index: 1.2 cm/m2
LA Diameter Height Index: 1.4 cm/m
LA Diameter: 2.3 cm
LV ASE Mass BSA Index: 69.9 gm/m2
LV ASE Mass Height 2.7 Index: 34.3 gm/m2.7
LV ASE Mass Height Index: 80.4 gm/m
LV ASE Mass: 132.7 gm
LV CO BSA Index: 2.09 L/min/m2
LV Cardiac Output: 3.98 L/min
LV Diastolic Volume Index: 31.6 mL/m2
LV Posterior Wall Thickness: 0.9 cm
LV SV - LVOT SV Diff: 0.8 mL
LV SV BSA Index: 20.5 mL/m2
LV SV Height Index: 23.6 mL/m
LV Septal Thickness: 1 cm
LV Stroke Volume: 39 mL
LV Systolic Volume Index: 11.1 mL/m2
LVED Diameter BSA Index: 2.3 cm/m2
LVED Diameter Height Index: 2.6 cm/m
LVED Diameter: 4.3 cm
LVED Volume BSA Index: 31.6 mL/m2
LVED Volume BSA Index: 32 ml/m2
LVED Volume Height Index: 36.3 mL/m
LVED Volume: 60 mL
LVEF (Volume): 65 %
LVES Volume BSA Index: 11 ml/m2
LVES Volume BSA Index: 11.1 mL/m2
LVES Volume Height Index: 12.7 mL/m
LVES Volume: 21 mL
LVOT Area (calculated): 2.89 cm2
LVOT Cardiac Index: 2.05 L/min/m2
LVOT Cardiac Output: 3.9 L/min
LVOT Diameter: 1.92 cm
LVOT PWD VTI: 13.2 cm
LVOT PWD Velocity (mean): 56.2 cm/s
LVOT PWD Velocity (peak): 89.9 cm/s
LVOT SV BSA Index: 20.1 mL/m2
LVOT SV Height Index: 23.1 mL/m
LVOT Stroke Rate (mean): 162.6 mL/s
LVOT Stroke Rate (peak): 260.2 mL/s
LVOT Stroke Volume: 38.2 cc
MR Regurgitant Fraction (LV SV Mtd): 0.02
MR Regurgitant Volume (LV SV Mtd): 0.8 mL
MV Peak A Velocity: 87.1 cm/s
MV Peak E Velocity: 68.5 cm/s
Mitral Annular E/Ea Vel Ratio: 8.19
Mitral Annular Ea Velocity: 8.36 cm/s
RR Interval: 588.24 ms
Weight (lbs): 173.5 [lb_av]
Weight: 2776.03 oz

## 2018-03-18 MED ORDER — CEPHALEXIN 500 MG PO CAPS *I*
500.0000 mg | ORAL_CAPSULE | Freq: Four times a day (QID) | ORAL | 0 refills | Status: AC
Start: 2018-03-18 — End: 2018-03-22

## 2018-03-18 MED ORDER — SULFUR HEXAFLUORIDE MICROSPH 60.7-25 MG (LUMASON) IV SUSR *I*
2.0000 mL | INTRAMUSCULAR | Status: DC | PRN
Start: 2018-03-18 — End: 2018-03-19
  Administered 2018-03-18: 2 mL via INTRAVENOUS

## 2018-03-18 MED ORDER — SODIUM CHLORIDE 0.9 % INJ (FLUSH) WRAPPED *I*
10.0000 mL | Status: DC | PRN
Start: 2018-03-18 — End: 2018-03-18

## 2018-03-18 NOTE — Progress Notes (Signed)
Pt stated she will tell staff when she wants to be repositioned in bed, she does not want to be woken up to be repositioned.

## 2018-03-18 NOTE — Discharge Summary (Signed)
Name: Cindy Ochoa MRN: [id number] DOB: 13-Sep-1986     Admit Date: 03/16/2018   Date of Discharge: 03/18/2018     Patient was accepted for discharge to   Home or [facility] [1]           Discharge Attending Physician: Trudee Grip, MD      Hospitalization Summary    CONCISE NARRATIVE: Cindy Ochoa is a 32 year old woman with history of C6 quadriplegia with neurogenic bladder with ileal loop urosotmy c/b recurrent UTIs, autonomic dysfunction, and chronic sacral pressure ulcer who presented with fever, heart palpitations, and worsening spasms. Reported to have been in SVT en route to the hospital that resolved following vagal maneuvers with EMS. EKG reassuring and telemetry showed no recurrence. She also had an echocardiogram due to S4 gallop noted on physical exam. Given the absence of other focal source of infection, UTI was suspected. She received ceftriaxone and cultures collected after initiation of antibiotics grew mixed flora and Enterococcus. Given clinical improvement on ceftriaxone and the fact that cultures were collected after initiation of antibiotics, she was transitioned to cephalexin to complete treatment based on previous culture sensitivities. She reported being off antispasmodics since the start of 2019 and was discharged to follow-up closely with PM&R and her PCP to discuss resuming these medications.       CARDIAC TESTING:   03/18/18 Echo-  Technically difficult imaging d/t lack of acoustic windows.    Small LV volumes.   Normal calculated resting LVEF (65%) without significant wall motion abnormalities.   No significant valvular abnormalities.   Grossly normal right heart size and function but indeterminate pulmonary artery pressures.          XRAY RESULTS: 03/16/18 CXR- No acute cardiopulmonary disease.       SIGNIFICANT MED CHANGES: Yes  Cephalexin 500mg  QID        Signed: Graylon Good, MD  On: 03/18/2018  at: 4:11 PM

## 2018-03-18 NOTE — Progress Notes (Signed)
Per medical team patient medically ready for discharge today. SW contacted [employer] to arrange cab transport home. SW arranged transport for today at 5:30PM via [facility], confirmation# [id number]. Per unit care coordinator, patient will contact her aides to restart.     Deneen Harts, [place] [phone]

## 2018-03-18 NOTE — Progress Notes (Signed)
Patient stated that she can restart her aides whenever she is discharged. Patient stated that she would need stretcher transportation home. SW will arrange this when patient is ready to be discharged. Patient also stated that she has everything that she needs at home and does not need anything additional. Threasa Alpha, RN, Acute Care [facility], [phone]

## 2018-03-18 NOTE — Discharge Instructions (Signed)
Brief Summary of Your Hospital Course (including key procedures and diagnostic test results):  You were admitted to the hospital with fever, increased spasms, and heart palpitations. You were noted to have a fast heart rhythm on the way to the hospital that resolved with bearing down. This rhythm was most likely due to fever and did not occur again during the admission. You had an echocardiogram, ultrasound of your heart, that did not show any concerning findings. You received IV antibiotics for the suspected urinary tract infection which were changed to oral antibiotics prior to discharge.     Your instructions:  Take all antibiotics as prescribed.  Follow-up with Dr. Iva Boop and Dr. Priscille Loveless.     What to do after you leave the hospital:    Recommended diet: Regular - No restrictions    Recommended activity: activity as tolerated    ? Wound Care: R ischium - Cleanse wound bed with normal saline, pack small opening with a piece of aquacel ag, cover with allevyn gentle.    Change every 3-5 day(s) & PRN  sacrum - Cleanse wound bed with normal saline, Apply cavilon within wound and pack with aquacel ag. Do not apply cover dressing as this will add excess moisture. Perform every other day and PRN.     If you experience any of these symptoms within the first 24 hours after discharge:Uncontrolled pain, Shortness of breath, Fever of 101 F. or greater or Poor urinary output please follow up with the discharge attending Dr. Job Founds at phone-number: [phone]    If you experience any of these symptoms 24 hours or more after discharge:Uncontrolled pain, Fever of 101 F. or greater or Poor urinary output  please follow up with your PCP:  Thornell Sartorius, MD [phone]

## 2018-03-18 NOTE — Progress Notes (Signed)
OB/GYN Family Planning Consultation     In to see patient to offer contraceptive education.  Patient accepted education session.    Sexually active: no  Currently engaged in care with OB/GYN: yes Where: [facility]  Number of total pregnancies: 1  Number of children: 1  Desires future fertility: yes  Current method of contraception: Nexplanon  Prior methods of contraception: Shot  Currently at risk for pregnancy: No    Patient accepted session and then upon discussion of OB/GYN care, she stated she has Nexplanon. Second time using Nexplanon and patient is satisfied with method. Patient was very engaged and said she has not had an appointment with OB/GYN in two years and accepted the phone number for [facility]. Patient stated she wants more kids but that she was medically advised not to. Patient is not planning on having more children.     For questions or concerns, please call Donney Rankins, Contraception Educator, at [phone].    Donney Rankins, Contraception Educator  [facility]  [phone]

## 2018-03-18 NOTE — Progress Notes (Signed)
Hospital Medicine Service Attending Progress Note    Significant Events/Subjective:   Had multiple BMs yesterday after bowel regimen  Notes improvement in spasms after having BM  Doesn't feel that Baclofen is an effective medication for her and would prefer to f/u with PM&R to discuss other options  No fever, chills, CP, palpitations, dyspnea    Objective:     Physical Exam  Temp:  [35 C (95 F)-36.3 C (97.3 F)] 35 C (95 F)  Heart Rate:  [74-94] 74  Resp:  [18-20] 20  BP: (107-140)/(73-82) 107/73  78.7 kg (173 lb 8 oz)    Constitutional: alert female lying in bed, interactive and in no distress   CV: RRR with S4 gallop, no edema  Resp: normal work of breathing, CTA anterolaterally  GI: soft, less distended than on prior exam, nontender, +BS, urostomy site intact  MSK/Neuro: b/l hand contractures unchanged, no spasms noted during my evaluation    Recent Lab, Micro, and Imaging Studies   Personally reviewed and notable for:  Urine cx: mixed flora with Enterococcus faecalis    Current Inpatient Medications:   ceftriaxone  1,000 mg Intravenous Q24H    baclofen  5 mg Oral TID    docusate sodium  100 mg Oral BID    senna  1 tablet Oral Nightly    nicotine  1 patch Transdermal Daily     acetaminophen, bisacodyl    Assessment:   32 year old female with PMHx of C6 spinal cord injury with quadriplegia, neurogenic bladder with ileal loop urostomy, nephrolithiasis, recurrent UTIs, and chronic sacral pressure ulcer presenting with increased spasms, dyspnea, fever/chills, and transient SVT resolving with vagal maneuvers. Suspect sepsis as cause of symptoms from presumed urinary source although it is possible that constipation with autonomic dysfunction played a role.    Plans:   Sepsis with presumed urinary source: afebrile and clinical at baseline  -Enterococcus isolated from urine cx unlikely to be source of infection since it would not be treated with a cephalosporin; since the culture was obtained after dose of  Ceftriaxone had been given, it is possible that a true pathogen's growth was already suppressed        SVT: likely related to fever/sepsis, resolved with vagal maneuver  -TTE today  -D/c telemetry as no further SVT events  -No indication to start beta-blocker at this point as SVT likely provoked and self resolved    Sacral and ischial pressure ulcers:   -low air loss mattress  -turn and position q2hrs  -wound care consult appreciated; plan to continue wound care and outpatient wound care clinical f/u after discharge    Constipation: Colace, Senna, Bisacodyl every other day per home bowel regimen    Mild thrombocytopenia: review of old trends shows 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 Discharge Date (EDD): 3/22  PT and/or OT Recommendations/Discharge to: Home with aide services  Appointments Needed with: PCP, PM&R, Wound care clinic    Case discussed  with housestaff team    Tomeika Weinmann, MD on 03/18/2018 at 1:01 PM

## 2018-03-18 NOTE — Plan of Care (Signed)
Cognitive function     Cognitive function will be maintained or return to baseline Maintaining        Elimination     Patient is able to empty bladder or return to baseline Maintaining        Infection Prevention - GU     Patient is knowledgable of measures to prevent infection Maintaining        Mobility     Patient's functional status is maintained or improved Maintaining        Nutrition     Patient's nutritional status is maintained or improved Maintaining        Pain/Comfort     Patient's pain or discomfort is manageable Maintaining        Psychosocial     Demonstrates ability to cope with illness Maintaining        Safety     Patient will remain free of falls Maintaining     Prevent any intentional injury Maintaining

## 2018-03-18 NOTE — Plan of Care (Signed)
Discharge instructions given. Pt verbalized understanding. Pt in stable condition. Pt taken off unit via stretcher.

## 2018-03-18 NOTE — Progress Notes (Signed)
[facility]   Transportation Request Form / Physician Certification Statement     Patient Name:  Cindy Ochoa     Date of Birth:  08-01-1986   MRN: [id number]    Date of Service: 03/18/18 Requested Time of Pick up: 5:30PM    Patient Location:  [facility], Unit  [phone]    Patient Destination:Home:   [place] [address]    Number of steps into house?: 0    Requestors Name: Milana Obey, LMSW Call Back Number: [phone]     Payor: Medicaid [employer]    Transport for (check what type of treatment or service, at least one):  Discharge    Specify what type of treatment or service: Discharge to Home     Is this treatment or service available at sending facility?:  no    Requested Mode of Transport: Tax adviser (no medical attention needed)   Height: Height: 165.1 cm (5\' 5" )  Weight: Weight: 78.7 kg (173 lb 8 oz)   Round Trip/One Way: One Way    VENDOR: [employer] # [phone], phone [phone]     1. Medical condition that necessitates this mode of transport (i.e. oxygen, bed ridden, etc.): 32 year old female with PMHx of C6 spinal cord injury with quadriplegia, neurogenic bladder with ileal loop urostomy, nephrolithiasis, recurrent UTIs, and chronic sacral pressure ulcer.      2. What medical services are to be provided by crew?: None    3. Infection control needs (i.e. ORSA/VRE/Cdiff): no    4. What specific handling is required?: Positioning  Fall Precaution    5.  Patient mental status?: Normal Cognition    6. At time of transport is bed confinement ordered?: no        Is patient bed confined? no   Medical condition for bed confinement: no    Electronic Signature: Milana Obey, LMSW      Date:  03/18/18    Physical Signature: _______________________________________       Title: Discharge Planner

## 2018-03-19 LAB — AEROBIC CULTURE

## 2018-03-21 ENCOUNTER — Telehealth: Payer: Self-pay | Admitting: Internal Medicine

## 2018-03-21 LAB — BLOOD CULTURE: Bacterial Blood Culture: 0

## 2018-03-21 NOTE — Telephone Encounter (Addendum)
Care Management Transition Care Assessment    Date of Discharge Call:  03/21/2018      Reason for admission:  UTI, tacycardia                     Patient Assessment:    Do you have any health concerns since your discharge? :  No questions, concerns    Do you have all of your medications?  yes    Do you have any problems taking your medications?  no    Are you on any anticoagulation medication?  no    Are you having any problems getting around your house ? Do you need to use an assistive device ?  wheelchair      Who are the most important people in your life that provide you support?  Aides, mother    Transportation plan:   aide or through [employer]      Plan for Follow up appointment:  Requesting afternoon apppointment- call back to patient and appt scheduled 03/25/18.      Health Home Care Management Eligibility Assessment :    Eligible for health home care management?  Yes but refusing

## 2018-03-21 NOTE — Telephone Encounter (Signed)
Review of AVS:    Date of Discharge:    03/18/18    Reason for admission:    Tachycardia    Discharged to :     Home    On Coumadin or Warfarin:  no    Follow up appointment:    03/30/2018 3:15 PM Alena Bills, MD Physical Medicine & Rehabilitation   04/14/2018 10:00 AM Zollie Beckers, NP; [place], HOYER/STRETCHER [facility] [place]   05/06/2018 2:30 PM Rachel Bo, MD [facility]   05/18/2018 8:15 AM Ovidio Kin, Gray[place] Ormond, NP [facility]

## 2018-03-25 ENCOUNTER — Ambulatory Visit: Payer: MEDICAID | Attending: Internal Medicine | Admitting: Internal Medicine

## 2018-03-25 ENCOUNTER — Encounter: Payer: Self-pay | Admitting: Internal Medicine

## 2018-03-25 VITALS — BP 124/90 | Temp 96.6°F | Ht 65.0 in | Wt 172.0 lb

## 2018-03-25 DIAGNOSIS — N390 Urinary tract infection, site not specified: Secondary | ICD-10-CM | POA: Insufficient documentation

## 2018-03-25 LAB — AEROBIC CULTURE

## 2018-03-25 MED ORDER — AMPICILLIN 250 MG PO CAPS *I*
250.0000 mg | ORAL_CAPSULE | Freq: Four times a day (QID) | ORAL | 0 refills | Status: DC
Start: 2018-03-25 — End: 2018-05-09

## 2018-03-25 NOTE — Progress Notes (Signed)
Reason For Visit: The primary encounter diagnosis was Urinary tract infection, site not specified. A diagnosis of Recurrent UTI was also pertinent to this visit.      HPI:  Cindy Ochoa is 32 y.o. year old female with Hydro plegic, neurogenic bladder with urostomy, diabetes, autonomic dysfunction recurrent UTIs muscle spasticity    Her today she is worried about a UTI  emergency room and treated with ceftriaxone and discharged on Keflex  Urine cultures were collected after initiation of antibiotics  Blood cultures had no growth  Enterococcus --is attentive to Macrobid and ampicillin    Patient is here today as she reports she has no fever and chills but muscle spasms are worse.  Her urine continues to be cloudy    She just changed her urostomy bag and tubing    She has no follow-up with urology      Medications:     Current Outpatient Prescriptions   Medication Sig    ampicillin (PRINCIPEN) 250 MG capsule Take 1 capsule (250 mg total) by mouth 4 times daily    polyethylene glycol (GLYCOLAX) powder Take 17 g by mouth daily   Mix in 8 oz water or juice and drink.    Non-System Medication Urostomy drainage bags 2000 cc change as needed. Dx N39.46 and  G82.54    Ostomy Supplies [facility] Coloplast Urostomy two piece bag and wafer 1 1/4 "H  7/8 "V N31.9 Neuromuscular dysfunction of the bladder. Use as directed.    disposable underpads 30"x36" (CHUX) Use 6 times daily and PRN. 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 Dx N39.46 and  G82.54    Ostomy Supplies MISC Coloplast Urostomy two piece bag and wafer 1 1/4 "H  7/8 "V N31.9 Neuromuscular dysfunction of the bladder    Ostomy Supplies Pouch MISC Coloplast Urostomy two piece bag and wafer 1 1/4 "H  7/8 "V N31.9 Neuromuscular dysfunction of the bladder Use as directed    Ostomy Supplies MISC Barrier ring. Use as directed.     incontinence supply disposable Large pull ups - use up to 5 x daily  Dx N39.46    disposable gloves 1 box Dynarex PF Vinyl Gloves    Gauze Pads & Dressings (ABDOMINAL PAD) 8"X10" PADS By 1 each no specified route daily   Cover buttock wounds 2x daily    Adhesive Tape (MEDIPORE SURGICAL 2"X10YD) TAPE Secure the 2 buttocks  wound dressings 2x day    Wound Dressings (HYDROFERA BLUE 4"X4") PADS Cut and moisten with saline as directed and pack in to buttock wound daily    Non-System Medication Gel overlay mattress for hospital bed - diagnosis G82.54, L89.93, L89.159    docusate sodium (COLACE) 100 MG capsule Take 1 capsule (100 mg total) by mouth 2 times daily    bisacodyl (BISAC-EVAC) 10 MG suppository Place 1 suppository (10 mg total) rectally daily as needed    acetaminophen (MAPAP) 500 mg tablet Take 1 tablet (500 mg total) by mouth every 4-6 hours as needed   for pain    senna-docusate (PERICOLACE) 8.6-50 MG per tablet Take 2 tablets by mouth daily as needed for Constipation    albuterol HFA 108 (90 Base) MCG/ACT inhaler Inhale 1-2 puffs into the lungs every 6 hours as needed   Shake well before each use.    Non-System Medication Easy Tip Leg Bags  1000mg  - diagnosis G82.53 N39.46    etonogestrel (NEXPLANON) 68 MG IMPL Inject 68 mg into the skin once   Placed 10/20/16    generic DME Use as directed. Hospital bed with Low air loss mattress. Duration of use: 1 year.    Non-System Medication Hospital Bed with Air Mattress - Diagnosis G82.54, L89.93,L89.159  Medicaid [id number]     No current facility-administered medications for this visit.        Medication list reconciled this visit    Allergies:     Allergies   Allergen Reactions    Nitrofurantoin Nausea And Vomiting    Vancomycin Hives     hives 2006 but tolerated Rx in 2010  pt states she had vancomycin in 04/2012.  Feels this is not true allergy  as she has received it recently - had no reaction     Heparin Other (See Comments)     Thrombocytopenia;         Social history      Social History   Substance Use Topics    Smoking status: Former Smoker     Packs/day: 0.25     Years: 3.00     Types: Cigarettes     Start date: 12/09/2011     Quit date: 07/30/2016    Smokeless tobacco: Former Systems developer     Quit date: 04/29/2005      Comment: quit a little over a month ago as of 09/17/17    Alcohol use No       Review of Systems     ROS  12 point review of system was negative except those mentioned in HPI    Physical Exam:     Physical Exam   Constitutional: She is oriented to person, place, and time. She appears well-developed.   Cardiovascular: Normal rate, regular rhythm and normal heart sounds.    No edema    Pulmonary/Chest: Effort normal and breath sounds normal. No respiratory distress. She has no wheezes. She has no rales.   Abdominal: Soft. Bowel sounds are normal. There is no tenderness.   Neurological: She is alert and oriented to person, place, and time.   Patient is having muscle spasms        Vitals:    03/25/18 1420   BP: 124/90   Temp: 35.9 C (96.6 F)   TempSrc: Temporal   Weight: 78 kg (172 lb)   Height: 1.651 m (5\' 5" )     Wt Readings from Last 3 Encounters:   03/25/18 78 kg (172 lb)   03/18/18 78.7 kg (173 lb 8 oz)   01/29/18 78 kg (172 lb)     BP Readings from Last 3 Encounters:   03/25/18 124/90   03/18/18 91/54   01/29/18 (!) 126/96           RESULTS:       ASSESSMENT/PLAN:     Diagnoses and all orders for this visit:    Urinary tract infection, site not specified  -     Aerobic culture (Urine catheter)  -     ampicillin (PRINCIPEN) 250 MG capsule; Take 1 capsule (250 mg total) by mouth 4 times daily             Patient should be followed by urology, I asked her to make an appointment.    Red flags discussed with patient    Follow up: with ccp     Patient instructions      Patient Instructions  Fax    [facility] @ [place] [id number] [place],  Nurse Practitioner  [facility]  Internal  Medicine

## 2018-03-25 NOTE — Patient Instructions (Signed)
Fax    [facility] @ [place] [id number] [phone]

## 2018-03-26 LAB — AEROBIC CULTURE: Aerobic Culture: 0

## 2018-03-28 ENCOUNTER — Telehealth: Payer: Self-pay

## 2018-03-28 NOTE — Telephone Encounter (Signed)
-----   Message from Sherlon Handing, NP sent at 03/28/2018  7:36 AM EDT -----  Please call  No growth on urine  Stop antibiotics  Make sure to call urology for an appointment

## 2018-03-28 NOTE — Telephone Encounter (Signed)
Spoke to pt in regards to lab results.  Informed pt per Lavone Orn NP that she had no growth in her urine & to stop antibiotics.  Pt stated she was unable to get the antibiotics due to insurance coverage issues.  Also informed pt that [name] wants her to give urology a call to make an appointment with them.  Pt verbalized understanding.

## 2018-03-28 NOTE — Telephone Encounter (Signed)
Attempted to contact pt.  Left voicemail with office number requesting a call back.  Will try again later.

## 2018-03-30 ENCOUNTER — Ambulatory Visit: Payer: MEDICAID | Admitting: Physical Medicine and Rehabilitation

## 2018-03-30 ENCOUNTER — Telehealth: Payer: Self-pay | Admitting: Internal Medicine

## 2018-03-30 NOTE — Telephone Encounter (Signed)
Returned call but no answer.   Left message to call office, call back number provided.

## 2018-03-30 NOTE — Telephone Encounter (Signed)
Cindy Ochoa with [facility] stated they received a request for ampicillin (PRINCIPEN) 250 MG capsule     Cindy Ochoa stated they are unable to get the medication, and it isn't covered through the patient insurance either    Cindy Ochoa is wondering if an alternative medication would be prescribed     Cindy Ochoa can be reached at [phone]

## 2018-03-30 NOTE — Telephone Encounter (Signed)
Patient is calling to speak with a nurse in regards to her blood pressure.    Patient hung up before writer was able to get current bp rate    Caller is requesting a callback at [phone]

## 2018-03-30 NOTE — Telephone Encounter (Signed)
Called Seth Bake back and relayed message from NP [place].

## 2018-03-31 NOTE — Telephone Encounter (Signed)
Second attempt to contact patient, no answer.  Left message to call office.   Call back number provided.

## 2018-04-01 NOTE — Telephone Encounter (Signed)
Third attempt to reach patient, no answer.   Left message to call office if still with any concerns to discuss.   Call back number provided.     No additional number listed for patient.   Closing encounter and will await return contact.

## 2018-04-04 LAB — EKG 12-LEAD
P: 46 deg
PR: 172 ms
QRS: 57 deg
QRSD: 70 ms
QT: 320 ms
QTc: 433 ms
Rate: 110 {beats}/min
Severity: NORMAL
T: 73 deg

## 2018-04-12 ENCOUNTER — Telehealth: Payer: Self-pay | Admitting: Internal Medicine

## 2018-04-12 NOTE — Telephone Encounter (Signed)
Cindy Ochoa calling to report that she is experiencing cyst under left arm with drainage, cyst behind left ear. These symptoms have been going on for a little over a week     Patient requesting same day appointment? no     Patient has been scheduled for an appointment 04/15/18 (per patient request)  Patient requesting call back? no    Phone number confirmed at [phone]

## 2018-04-12 NOTE — Telephone Encounter (Signed)
Call to patient but no answer, line stopped ringing but did not go to voicemail.

## 2018-04-13 NOTE — Telephone Encounter (Signed)
Called and spoke with patient.   Pt states that she has a cyst under her arm and another behind her ear.   Pt states that her aid has been squeezing the one under her arm each day, just blood colored fluid coming out.   Pt denies redness, red streaks, pus, odor, fever, chills and is otherwise feeling fine.     Advised to try warm compresses, stop manipulating and call for new / worsening symptoms prior to Friday's appointment, otherwise will leave appointment as scheduled.     Pt verbalized understanding.

## 2018-04-13 NOTE — Telephone Encounter (Signed)
Cindy Ochoa is returning a call from [facility]. She states this is regarding SYMPTOMS. She is requesting a call back at [phone].

## 2018-04-13 NOTE — Telephone Encounter (Signed)
Second call to patient, no answer.   Left message to call office.   Call back number provided.

## 2018-04-14 ENCOUNTER — Encounter: Payer: Self-pay | Admitting: Surgery

## 2018-04-14 ENCOUNTER — Ambulatory Visit: Payer: MEDICAID | Attending: Surgery | Admitting: Surgery

## 2018-04-14 VITALS — BP 128/75 | HR 80 | Temp 95.8°F | Resp 16 | Ht 65.0 in | Wt 172.0 lb

## 2018-04-14 DIAGNOSIS — L89312 Pressure ulcer of right buttock, stage 2: Secondary | ICD-10-CM | POA: Insufficient documentation

## 2018-04-14 DIAGNOSIS — L89153 Pressure ulcer of sacral region, stage 3: Secondary | ICD-10-CM | POA: Insufficient documentation

## 2018-04-14 NOTE — Patient Instructions (Addendum)
Please follow these instructions about how to care for your wound.    .          Wound Location Dressing Orders Frequency of Dressing Change    Location: Sacral                                       Wound is healed! Continue to Change Position every 2 hours and continue to use special mattress and cushion.                                  Cleanse Wound(s) with:    [x] Soap &Water with dressing changes, keep dressing dry in between       [x] Leg Elevation: Elevate leg(s) to level of heart   or above for 30 minutes 3 times per day.  [x] Right   Leg [x] Left   Leg       [x] Assistive Device(s): [x] Wheelchair    [x] Pressure Reduction: [x] Wheelchair Cushion   [x] Specialty Bed [x] Reposition every 2 hours   [x] Limit time up in your chair to 2 hours or less at a time      [x] General: [x] Stop / Decrease Smoking        [] Discharge from [facility]
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
## 2018-04-15 ENCOUNTER — Ambulatory Visit: Payer: MEDICAID

## 2018-04-19 ENCOUNTER — Ambulatory Visit: Payer: MEDICAID | Attending: Pulmonology | Admitting: Pulmonology

## 2018-04-19 ENCOUNTER — Other Ambulatory Visit
Admission: RE | Admit: 2018-04-19 | Discharge: 2018-04-19 | Disposition: A | Discharge status: Home / Self Care | Payer: MEDICAID | Source: Ambulatory Visit

## 2018-04-19 VITALS — BP 90/60 | HR 85 | Temp 94.8°F | Ht 65.0 in | Wt 168.0 lb

## 2018-04-19 DIAGNOSIS — R61 Generalized hyperhidrosis: Secondary | ICD-10-CM | POA: Insufficient documentation

## 2018-04-19 DIAGNOSIS — G8250 Quadriplegia, unspecified: Secondary | ICD-10-CM | POA: Insufficient documentation

## 2018-04-19 LAB — COMPREHENSIVE METABOLIC PANEL
ALT: 16 U/L (ref 0–35)
AST: 14 U/L (ref 0–35)
Albumin: 4.5 g/dL (ref 3.5–5.2)
Alk Phos: 66 U/L (ref 35–105)
Anion Gap: 14 (ref 7–16)
Bilirubin,Total: <0.2 mg/dL (ref 0.0–1.2)
CO2: 22 mmol/L (ref 20–28)
Calcium: 9.8 mg/dL (ref 8.8–10.2)
Chloride: 109 mmol/L — ABNORMAL HIGH (ref 96–108)
Creatinine: 0.46 mg/dL — ABNORMAL LOW (ref 0.51–0.95)
GFR,Black: 151 *
GFR,Caucasian: 131 *
Glucose: 82 mg/dL (ref 60–99)
Lab: 13 mg/dL (ref 6–20)
Potassium: 3.8 mmol/L (ref 3.3–5.1)
Sodium: 145 mmol/L (ref 133–145)
Total Protein: 7.6 g/dL (ref 6.3–7.7)

## 2018-04-19 LAB — CBC AND DIFFERENTIAL
Baso # K/uL: 0 10*3/uL (ref 0.0–0.1)
Basophil %: 0.2 %
Eos # K/uL: 0 10*3/uL (ref 0.0–0.4)
Eosinophil %: 0.5 %
Hematocrit: 42 % (ref 34–45)
Hemoglobin: 13.2 g/dL (ref 11.2–15.7)
IMM Granulocytes #: 0 10*3/uL
IMM Granulocytes: 0.2 %
Lymph # K/uL: 3.2 10*3/uL (ref 1.2–3.7)
Lymphocyte %: 39.1 %
MCH: 30 pg/cell (ref 26–32)
MCHC: 32 g/dL (ref 32–36)
MCV: 96 fL — ABNORMAL HIGH (ref 79–95)
Mono # K/uL: 0.5 10*3/uL (ref 0.2–0.9)
Monocyte %: 5.8 %
Neut # K/uL: 4.4 10*3/uL (ref 1.6–6.1)
Nucl RBC # K/uL: 0 10*3/uL (ref 0.0–0.0)
Nucl RBC %: 0 /100 WBC (ref 0.0–0.2)
Platelets: 90 10*3/uL — ABNORMAL LOW (ref 160–370)
RBC: 4.4 MIL/uL (ref 3.9–5.2)
RDW: 14.9 % — ABNORMAL HIGH (ref 11.7–14.4)
Seg Neut %: 54.2 %
WBC: 8.1 10*3/uL (ref 4.0–10.0)

## 2018-04-19 LAB — MULTIPLE ORDERING DOCS
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
## 2018-04-19 NOTE — Patient Instructions (Signed)
Please have blood work drawn for the cultures and lab work. If the lab work is positive for infection, we will give you a call. For the bump on your ear, continue to keep it clean and let it drain on its own. If it turns red, swollen, and you feel more sick, please give [place] a call. Please use moisturizer cream on the patch of dry skin on your back.

## 2018-04-25 ENCOUNTER — Other Ambulatory Visit
Admission: RE | Admit: 2018-04-25 | Discharge: 2018-04-25 | Disposition: A | Discharge status: Home / Self Care | Payer: MEDICAID | Source: Ambulatory Visit | Attending: Urology | Admitting: Urology

## 2018-04-25 ENCOUNTER — Encounter: Payer: Self-pay | Admitting: Urology

## 2018-04-25 ENCOUNTER — Ambulatory Visit: Payer: MEDICAID | Attending: Urology | Admitting: Urology

## 2018-04-25 VITALS — BP 113/69 | HR 99 | Temp 95.5°F | Ht 65.0 in | Wt 168.0 lb

## 2018-04-25 DIAGNOSIS — N319 Neuromuscular dysfunction of bladder, unspecified: Secondary | ICD-10-CM

## 2018-04-25 DIAGNOSIS — N390 Urinary tract infection, site not specified: Secondary | ICD-10-CM

## 2018-04-25 NOTE — Progress Notes (Addendum)
Started patient visit. She expressed anger that she had not been contacted regarding her plan of care since her last visit one year ago. Our department, including myself personally have been in touch with her several times per telephone and Mychart encounters. She expressed anger that she had not been called by Dr. [name]. He reports that she continues to have leakage from her urethra. Offered patient appointment with Dr. [name] at [place] [facility] and she declined, stating "I'm going  to get another doctor and call my Lawyer. You are all trying to cover something up, that's how you know" before leaving. I did inform the patient that we were not not attempting to deceive or mislead her in any way and that I was not able to offer an explanation as to why she is still leaking at this time. Unfortunately, the visit could not be completed as she proceeded to leave the room at that point.    Will discuss with Dr. [name] regarding next steps.

## 2018-04-26 ENCOUNTER — Telehealth: Payer: Self-pay

## 2018-04-26 ENCOUNTER — Telehealth: Payer: Self-pay | Admitting: Urology

## 2018-04-26 LAB — AEROBIC CULTURE: Aerobic Culture: 0

## 2018-04-26 NOTE — Telephone Encounter (Signed)
-----   Message from Aldine Contes sent at 04/25/2018  3:39 PM EDT -----  Regarding: FW: AGY booked incorrectly  Hi Simara Rhyner,    Please work on this.  ----- Message -----  From: Nicholas Lose, RN  Sent: 04/22/2018   5:15 PM  To: Aldine Contes  Subject: AGY booked incorrectly                           Hi Erick Colace brought this patients AGY booked incorrectly to our attention. This patient is quadriplegic and will need a 45 minute agy as well as ensuring she comes with appropriate transportation/ assistance.   Can we try to get this corrected? thanks

## 2018-04-26 NOTE — Telephone Encounter (Signed)
Writer contact pt. Pt has been rescheduled pt states she has transportation and an aide to help assist.

## 2018-04-26 NOTE — Telephone Encounter (Signed)
I would recommend that patient be scheduled for FUV with Dr. [name] in the near future if she is agreeable, [place] only. When she calls back, please schedule, okay to overbook as needed. Thanks!

## 2018-04-26 NOTE — Telephone Encounter (Signed)
Patient is returning a call to Dr. [name] regarding 04/26/2018 message.    Please call the patient back at [phone] (H)

## 2018-04-26 NOTE — Telephone Encounter (Signed)
I was informed about the clinic visit yesterday from [facility].  I left a VM for patinet to discuss next steps as I failed to follow-up with the patient as noted in my last clinic note, now 1 year ago.     I left my number to connect with the patient.

## 2018-04-27 ENCOUNTER — Telehealth: Payer: Self-pay | Admitting: Urology

## 2018-04-27 NOTE — Telephone Encounter (Signed)
I talked with patient this morning. I apologized for my delay in getting back with her, essentially I forgot and no reminder or inquiry from patient until recent.     I agree with [place] Genera that a RV would be best to discuss next steps. Patient was in agreement with this as well.     M[place]ay please contact patient and arrange for follow-up to discuss next steps for her urinary incontinence. Hopefully we can get her in soon. I did inform my schedule is tight but we will do our best.     She has no questions or concerns about my call.

## 2018-04-27 NOTE — Telephone Encounter (Signed)
Left message for patient to call our office.plese transfer patient.  Thank You

## 2018-04-29 ENCOUNTER — Other Ambulatory Visit: Payer: Self-pay | Admitting: Urology

## 2018-04-29 DIAGNOSIS — N319 Neuromuscular dysfunction of bladder, unspecified: Secondary | ICD-10-CM

## 2018-04-29 NOTE — Telephone Encounter (Signed)
That's reasonable. I've placed an order. Staff, please schedule. Thanks

## 2018-04-29 NOTE — Telephone Encounter (Signed)
[name],   See 04/27/18 telephone encounter as well as below. Please let me know how to proceed.
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
## 2018-05-05 ENCOUNTER — Telehealth: Payer: Self-pay | Admitting: Internal Medicine

## 2018-05-05 DIAGNOSIS — M62838 Other muscle spasm: Secondary | ICD-10-CM

## 2018-05-05 DIAGNOSIS — Z87440 Personal history of urinary (tract) infections: Secondary | ICD-10-CM

## 2018-05-05 DIAGNOSIS — R000 Tachycardia, unspecified: Secondary | ICD-10-CM
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
## 2018-05-06 ENCOUNTER — Ambulatory Visit: Payer: MEDICAID | Admitting: Student in an Organized Health Care Education/Training Program

## 2018-05-06 ENCOUNTER — Other Ambulatory Visit
Admission: RE | Admit: 2018-05-06 | Discharge: 2018-05-06 | Disposition: A | Discharge status: Home / Self Care | Payer: MEDICAID | Source: Ambulatory Visit | Attending: Internal Medicine | Admitting: Internal Medicine

## 2018-05-06 ENCOUNTER — Telehealth: Payer: Self-pay | Admitting: Urology

## 2018-05-06 DIAGNOSIS — Z87440 Personal history of urinary (tract) infections: Secondary | ICD-10-CM | POA: Insufficient documentation

## 2018-05-06 DIAGNOSIS — R000 Tachycardia, unspecified: Secondary | ICD-10-CM | POA: Insufficient documentation

## 2018-05-06 DIAGNOSIS — N390 Urinary tract infection, site not specified: Secondary | ICD-10-CM | POA: Insufficient documentation

## 2018-05-06 DIAGNOSIS — M62838 Other muscle spasm: Secondary | ICD-10-CM | POA: Insufficient documentation

## 2018-05-06 LAB — URINALYSIS WITH MICROSCOPIC
Ketones, UA: NEGATIVE
Nitrite,UA: NEGATIVE
Protein,UA: NEGATIVE mg/dL
RBC,UA: 9 /hpf — AB (ref 0–2)
Specific Gravity,UA: 1.013 (ref 1.002–1.030)
WBC,UA: 33 /hpf — ABNORMAL HIGH (ref 0–5)
pH,UA: 6 (ref 5.0–8.0)

## 2018-05-06 LAB — MULTIPLE ORDERING DOCS

## 2018-05-06 NOTE — Telephone Encounter (Signed)
Patient scheduled for Korea at [facility] on 12/27 @ 10:30am--mailed letter

## 2018-05-06 NOTE — Telephone Encounter (Signed)
Spoke with pt.  Pt states she is just leaving the lab now.  Pt states muscle spasms are less severe today, is no longer having tachycardia.  Pt denies fever/chills.  Advised pt this matter would be addressed with a provider.  Pt verbalized understanding.

## 2018-05-07 ENCOUNTER — Telehealth: Payer: Self-pay | Admitting: Internal Medicine

## 2018-05-07 DIAGNOSIS — N3001 Acute cystitis with hematuria: Secondary | ICD-10-CM

## 2018-05-07 DIAGNOSIS — N3090 Cystitis, unspecified without hematuria: Secondary | ICD-10-CM

## 2018-05-07 LAB — AEROBIC CULTURE: Aerobic Culture: 0

## 2018-05-07 MED ORDER — CEFPODOXIME PROXETIL 200 MG PO TABS *I*
200.0000 mg | ORAL_TABLET | Freq: Two times a day (BID) | ORAL | 0 refills | Status: DC
Start: 2018-05-07 — End: 2018-05-09

## 2018-05-07 NOTE — Telephone Encounter (Signed)
Called Ms [facility] regarding urine culture results.      On 5/10, she went to lab for peripheral blood cultures & urine culture.  She actually had two urine cultures resulted (I had ordered a UA with reflex to culture and a urology NP ordered a UC alone).  One of the two (the earlier) grew E Coli & Proteus (both greater > 100,000).      She reports that her muscle spasms are better and that she no longer has tachycardia.  However, given the colony count and the worse appearance of this UA compared to prior (more RBCs, WBCs, & bacteria than prior UA), it makes sense to treat as complicated UTI with cefpodoxime 200mg  bid x 10 days.  When she has previously had both E Coli & proteus it was sensitive to cefpodoxime.    Pt agreed with the plan & confirmed her pharmacy.

## 2018-05-08 NOTE — Telephone Encounter (Signed)
Paged at 5:53 PM 05/07/18 by Diadra at [facility] [phone]; I returned call at 6:01 PM and got voicemail that pharmacy was closed. Left message to page again for any questions. I see that Dr Vickey Sages spoke with pt and started her on cefpodoxime and prescription was received by pharmacy at 5:31PM.

## 2018-05-09 ENCOUNTER — Ambulatory Visit: Payer: MEDICAID | Attending: Internal Medicine | Admitting: Internal Medicine

## 2018-05-09 ENCOUNTER — Encounter: Payer: Self-pay | Admitting: Internal Medicine

## 2018-05-09 ENCOUNTER — Telehealth: Payer: Self-pay | Admitting: Urology

## 2018-05-09 VITALS — BP 132/72 | HR 88 | Temp 95.7°F | Ht 65.0 in | Wt 168.0 lb

## 2018-05-09 DIAGNOSIS — N3001 Acute cystitis with hematuria: Secondary | ICD-10-CM | POA: Insufficient documentation

## 2018-05-09 DIAGNOSIS — R21 Rash and other nonspecific skin eruption: Secondary | ICD-10-CM | POA: Insufficient documentation

## 2018-05-09 DIAGNOSIS — N3090 Cystitis, unspecified without hematuria: Secondary | ICD-10-CM | POA: Insufficient documentation

## 2018-05-09 DIAGNOSIS — M810 Age-related osteoporosis without current pathological fracture: Secondary | ICD-10-CM | POA: Insufficient documentation

## 2018-05-09 LAB — AEROBIC CULTURE

## 2018-05-09 MED ORDER — CEFPODOXIME PROXETIL 200 MG PO TABS *I*
200.0000 mg | ORAL_TABLET | Freq: Two times a day (BID) | ORAL | 0 refills | Status: AC
Start: 2018-05-09 — End: 2018-05-19

## 2018-05-09 NOTE — Telephone Encounter (Signed)
Does she have a ileal loop? Ostomy? If yes, then we need to watch for fever, malaise, or/and gross hematuria. Cloudy urine itself is not indicative   Her primary should get a prior auth if they are prescribing an antibiotic.

## 2018-05-09 NOTE — Telephone Encounter (Signed)
Cindy Ochoa is returning a call from [place]. She states this is regarding message below. States that she is a quadriplegic, so it is hard for her to determine if there is dysuria or bladder pain, etc.States that her internal medicine doctor prescribed an antibiotic for the UTI, but her insurance is asking for a prior authorization. She is requesting a call back at [phone].

## 2018-05-09 NOTE — Progress Notes (Signed)
Reason For Visit: The primary encounter diagnosis was Rash and other nonspecific skin eruption. Diagnoses of Recurrent bacterial cystitis, Acute cystitis with hematuria, and Osteoporosis were also pertinent to this visit.      HPI:  Cindy Ochoa is 32 y.o. year old female with a quadriplegic at C6, neurogenic bladder, depression, autonomic dysfunction, frequent UTIs, pressure sure, osteoporosis    Patient had a urine test done on May 06, 2018  Component 3d ago   Aerobic Culture Proteus mirabilis     Comment: >100,000/ml   Aerobic Culture Escherichia coli     Comment: >100,000/ml     10 was called into her pharmacy but she never picked it up because it being a prior approval  Urine continues to be cloudy from her urostomy  She denies fever or chills  Her spasticity has been worse  Blood cultures have no growth    Shows follow-up with urology    Patient reports she has some bumps underneath her axilla bilaterally  Reports they are getting worse  She has no drainage    She has a history of osteoporosis   no DEXA scan since 2015  At that time she was referral to orthopedic bone clinic  Really not on vitamin D and calcium        Medications:     Current Outpatient Prescriptions   Medication Sig    cefpodoxime (VANTIN) 200 MG tablet Take 1 tablet (200 mg total) by mouth 2 times daily for 10 days    polyethylene glycol (GLYCOLAX) powder Take 17 g by mouth daily   Mix in 8 oz water or juice and drink.    Non-System Medication Urostomy drainage bags 2000 cc change as needed. Dx N39.46 and  G82.54    Ostomy Supplies [facility] Coloplast Urostomy two piece bag and wafer 1 1/4 "H  7/8 "V N31.9 Neuromuscular dysfunction of the bladder. Use as directed.    disposable underpads 30"x36" (CHUX) Use 6 times daily and PRN. 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 Dx N39.46 and  G82.54    Ostomy  Supplies MISC Coloplast Urostomy two piece bag and wafer 1 1/4 "H  7/8 "V N31.9 Neuromuscular dysfunction of the bladder    Ostomy Supplies Pouch MISC Coloplast Urostomy two piece bag and wafer 1 1/4 "H  7/8 "V N31.9 Neuromuscular dysfunction of the bladder Use as directed    Ostomy Supplies MISC Barrier ring. Use as directed.    incontinence supply disposable Large pull ups - use up to 5 x daily  Dx N39.46    disposable gloves 1 box Dynarex PF Vinyl Gloves    Gauze Pads & Dressings (ABDOMINAL PAD) 8"X10" PADS By 1 each no specified route daily   Cover buttock wounds 2x daily    Adhesive Tape (MEDIPORE SURGICAL 2"X10YD) TAPE Secure the 2 buttocks  wound dressings 2x day    Wound Dressings (HYDROFERA BLUE 4"X4") PADS Cut and moisten with saline as directed and pack in to buttock wound daily    Non-System Medication Gel overlay mattress for hospital bed - diagnosis G82.54, L89.93, L89.159    docusate sodium (COLACE) 100 MG capsule Take 1 capsule (100 mg total) by mouth 2 times daily    bisacodyl (BISAC-EVAC) 10 MG suppository Place 1 suppository (10 mg total) rectally daily as needed    acetaminophen (MAPAP) 500  mg tablet Take 1 tablet (500 mg total) by mouth every 4-6 hours as needed   for pain    senna-docusate (PERICOLACE) 8.6-50 MG per tablet Take 2 tablets by mouth daily as needed for Constipation    albuterol HFA 108 (90 Base) MCG/ACT inhaler Inhale 1-2 puffs into the lungs every 6 hours as needed   Shake well before each use.    Non-System Medication Easy Tip Leg Bags 1000mg  - diagnosis G82.53 N39.46    etonogestrel (NEXPLANON) 68 MG IMPL Inject 68 mg into the skin once   Placed 10/20/16    generic DME Use as directed. Hospital bed with Low air loss mattress. Duration of use: 1 year.    Non-System Medication Hospital Bed with Air Mattress - Diagnosis G82.54, L89.93,L89.159  Medicaid [id number]     No current facility-administered medications for this visit.        Medication list reconciled  this visit    Allergies:     Allergies   Allergen Reactions    Nitrofurantoin Nausea And Vomiting    Vancomycin Hives     hives 2006 but tolerated Rx in 2010  pt states she had vancomycin in 04/2012.  Feels this is not true allergy  as she has received it recently - had no reaction     Heparin Other (See Comments)     Thrombocytopenia;        Social history      Social History   Substance Use Topics    Smoking status: Current Every Day Smoker     Packs/day: 0.25     Years: 3.00     Types: Cigarettes     Start date: 12/09/2011     Last attempt to quit: 07/30/2016    Smokeless tobacco: Former Systems developer     Quit date: 04/29/2005      Comment: ~4 cigs a day    Alcohol use No       Review of Systems     ROS  12 point review of system was negative except those mentioned in HPI    Physical Exam:     Physical Exam   Constitutional: She appears well-developed.   Eyes: Conjunctivae are normal.   Neck: Normal range of motion. Neck supple.   Cardiovascular: Normal rate, regular rhythm and normal heart sounds.    No edema    Pulmonary/Chest: Effort normal and breath sounds normal. No respiratory distress. She has no wheezes. She has no rales.   Abdominal: Soft. Bowel sounds are normal. There is no tenderness.   Urostomy   Skin: Rash noted. Rash is pustular.        Psychiatric: She has a normal mood and affect.            Vitals:    05/09/18 1148   BP: 132/72   Pulse: 88   Temp: 35.4 C (95.7 F)   TempSrc: Temporal   Weight: 76.2 kg (168 lb)   Height: 1.651 m (5\' 5" )     Wt Readings from Last 3 Encounters:   05/09/18 76.2 kg (168 lb)   04/25/18 76.2 kg (168 lb)   04/19/18 76.2 kg (168 lb)     BP Readings from Last 3 Encounters:   05/09/18 132/72   04/25/18 113/69   04/19/18 90/60       RESULTS:       ASSESSMENT/PLAN:     Diagnoses and all orders for this visit:    Rash and other nonspecific skin eruption/probable  hidradenitis  -     AMB REFERRAL TO DERMATOLOGY    Recurrent bacterial cystitis  -     cefpodoxime (VANTIN) 200 MG  tablet; Take 1 tablet (200 mg total) by mouth 2 times daily for 10 days        Sent to our pharmacy and voucher about social worker      Osteoporosis  -     AMB REFERRAL TO ORTHOPEDIC SURGERY  -     DEXA Scan; Future  -     Vitamin D; Future         Urged her to take over-the-counter vitamin D and calcium        Follow up: [place],  Nurse Practitioner  [facility] Internal  Medicine

## 2018-05-09 NOTE — Telephone Encounter (Signed)
Nursing - please reach the patient and inquire if she is experiencing any symptoms of an acute UTI such as burning with urination, bladder pain, increased urgency/frequency, fever/chills.   Her urine culture came back contaminated. If she is asymptomatic - will not require treatment. If symptomatic will call the lab and attempt to request to isolate specific organisms.   Thanks!

## 2018-05-09 NOTE — Telephone Encounter (Signed)
Message left to call the office and let us know how she is feeling.    Please ask questions - see below.

## 2018-05-10 ENCOUNTER — Telehealth: Payer: Self-pay | Admitting: Internal Medicine

## 2018-05-10 NOTE — Telephone Encounter (Signed)
[employer] is calling with a question on the medication cefpodoxime (VANTIN) 200 MG tablet.   The question is: not covered under insurance are requesting alternative medication       Please return their call at [phone].  to advise.

## 2018-05-11 ENCOUNTER — Ambulatory Visit: Payer: MEDICAID | Admitting: Physical Medicine and Rehabilitation

## 2018-05-11 ENCOUNTER — Telehealth: Payer: Self-pay | Admitting: Internal Medicine

## 2018-05-11 NOTE — Telephone Encounter (Signed)
Rx request routed to provider on 05/11/2018 at 3:07 PM

## 2018-05-11 NOTE — Telephone Encounter (Signed)
[facility] saw Cindy Ochoa on 5/13 and forgot to ask for a prescription for sleep medication.  Pharmacy is [facility].

## 2018-05-12 LAB — BLOOD CULTURE
Bacterial Blood Culture: 0
Bacterial Blood Culture: 0

## 2018-05-12 NOTE — Telephone Encounter (Signed)
Called back patient. She states she has been having difficulty sleeping at night for the past 1 month. She cannot identify any change to account for insomnia but states sometimes she wakes up to do shaking. She is able to fall asleep at 7 pm and get three hours in before waking up again, being awake for a little while then going back to sleep for a few more hours. She is no longer getting 7 hours of sleep she normally gets. She has not tried anything for sleep yet.     Educated her on sleep hygiene. She is willing to try melatonin to start. Will purchase OTC. Told her to take it couple of hours before she wants to go to sleep. Told her to call back in 1-2 weeks if melatonin not helping and we could try something else, probably trazodone.     Dyke Brackett, MD

## 2018-05-13 ENCOUNTER — Telehealth: Payer: Self-pay | Admitting: Internal Medicine

## 2018-05-13 NOTE — Telephone Encounter (Signed)
A user error has taken place: encounter opened in error, closed for administrative reasons.

## 2018-05-18 ENCOUNTER — Ambulatory Visit: Payer: MEDICAID | Admitting: Obstetrics and Gynecology

## 2018-05-18 ENCOUNTER — Telehealth: Payer: Self-pay | Admitting: Internal Medicine

## 2018-05-18 NOTE — Telephone Encounter (Signed)
Call to [facility] to give appointment scheduled for a Dexa Scan  as requested at last office visit.     Cindy Ochoa is scheduled on July 22 nd  at 8:30 am .    Prep for this exam : none    This test is located at [address] [place] [address] [place] [address] .     Should patient need to reschedule, they can call [phone].    Call Outcome: spoke to pt

## 2018-05-19 ENCOUNTER — Other Ambulatory Visit
Admission: RE | Admit: 2018-05-19 | Discharge: 2018-05-19 | Disposition: A | Discharge status: Home / Self Care | Payer: MEDICAID | Source: Ambulatory Visit | Attending: Internal Medicine | Admitting: Internal Medicine

## 2018-05-19 DIAGNOSIS — M810 Age-related osteoporosis without current pathological fracture: Secondary | ICD-10-CM

## 2018-05-24 LAB — VITAMIN D
25-OH VIT D2: <4 ng/mL
25-OH VIT D3: 20 ng/mL
25-OH Vit Total: 20 ng/mL — ABNORMAL LOW (ref 30–60)

## 2018-05-26 ENCOUNTER — Telehealth: Payer: Self-pay

## 2018-05-26 NOTE — Telephone Encounter (Signed)
-----   Message from Sherlon Handing, NP sent at 05/26/2018  3:27 PM EDT -----  Vitamin D is low she should be taking 1000 mg per day

## 2018-05-26 NOTE — Telephone Encounter (Signed)
Spoke with pt and informed her that her vitamin D was low per Lavone Orn NP and to continue to take 1000mg  daily of the vit D supplement.  Pt stated verbal understanding.

## 2018-05-27 ENCOUNTER — Ambulatory Visit: Payer: MEDICAID | Admitting: Obstetrics and Gynecology
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
## 2018-06-03 ENCOUNTER — Telehealth: Payer: Self-pay

## 2018-06-03 NOTE — Telephone Encounter (Signed)
Cindy Ochoa is returning a my chart message from [place]. She states this is regarding referral scheduling. She is requesting a call back at [phone].

## 2018-06-03 NOTE — Telephone Encounter (Signed)
Cindy Ochoa was contacted and scheduled. Thank you !

## 2018-06-07 ENCOUNTER — Encounter: Payer: Self-pay | Admitting: Urology

## 2018-06-07 ENCOUNTER — Ambulatory Visit: Payer: MEDICAID | Attending: Urology | Admitting: Urology

## 2018-06-07 VITALS — BP 101/64 | HR 85 | Temp 96.6°F | Ht 65.0 in | Wt 168.0 lb

## 2018-06-07 DIAGNOSIS — R32 Unspecified urinary incontinence: Secondary | ICD-10-CM | POA: Insufficient documentation

## 2018-06-07 NOTE — Progress Notes (Signed)
Note entered in error
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
## 2018-06-08 ENCOUNTER — Telehealth: Payer: Self-pay | Admitting: Urology

## 2018-06-08 NOTE — Telephone Encounter (Signed)
I attempted to contact patient and left a VM for her this morning as a follow-up to our clinic encounter yesterday.     I will try back later today or tomorrow.     [place] FYI.

## 2018-06-10 ENCOUNTER — Telehealth: Payer: Self-pay | Admitting: Urology

## 2018-06-10 NOTE — Telephone Encounter (Signed)
I talked with patient today and explained that I do not feel that this is actual urine as she has a complete urinary diversion. This drainage could be vaginal or pyocystis from her bladder that remains insitu. She reports that the drainage is minimal with a spot on a pad which is changed BID.     I do not feel that there is anything further we should. Her initial concern was about skin breakdown however this does not appear to be a risk of skin breakdown based on the minimal drainage.     No further intervention. Hopefully once she sees PM&R she will get her spasm under control.     She plans to [place] to [facility], date undetermined. She agreed to RV in 6 months or sooner if any issues.     [facility] please arrange for RV in 6 months.     Thanks .

## 2018-06-10 NOTE — Telephone Encounter (Signed)
Patient is returning call to [place]. Please call her back.

## 2018-06-13 NOTE — Telephone Encounter (Signed)
Patient appointment scheduled. Thank You     Appointment Information   Name: Cindy Ochoa, Cindy Ochoa MRN: [id number]   Date: 01/03/2019 Status: Sch   Time: 10:30 AM Length: 15   Visit Type: FOLLOW UP VISIT [5056] Copay: $0.00   Provider: Alden Server, MD Department: [facility] UROLOGY     Address: [address], [place] [address]   [place] #:     Appt. Arrival Location      Referring Provider:  CSN: [id number]   HAR:      Referral Id:      Notes: Neurogenic bladder   Dx    Transport Status:      Made On: 06/13/2018 3:13 PM By: Gean Quint   Rescheduled From:      Rescheduled To:      Letters Printed:      Appointment Question Answers   Answers Entered: Mon Jun 13, 2018 3:13 PM By:

## 2018-06-21 ENCOUNTER — Encounter: Payer: Self-pay | Admitting: Gastroenterology

## 2018-06-22 ENCOUNTER — Ambulatory Visit: Payer: MEDICAID | Admitting: Internal Medicine

## 2018-06-22 ENCOUNTER — Ambulatory Visit: Payer: MEDICAID | Attending: Physical Medicine and Rehabilitation | Admitting: Physical Medicine and Rehabilitation

## 2018-06-22 ENCOUNTER — Encounter: Payer: Self-pay | Admitting: Physical Medicine and Rehabilitation

## 2018-06-22 ENCOUNTER — Encounter: Payer: Self-pay | Admitting: Internal Medicine

## 2018-06-22 VITALS — BP 129/78 | HR 101 | Temp 97.5°F

## 2018-06-22 VITALS — BP 100/60 | HR 84 | Temp 96.1°F | Ht 65.0 in | Wt 168.0 lb

## 2018-06-22 DIAGNOSIS — F32A Depression, unspecified: Secondary | ICD-10-CM

## 2018-06-22 DIAGNOSIS — M62838 Other muscle spasm: Secondary | ICD-10-CM | POA: Insufficient documentation

## 2018-06-22 DIAGNOSIS — K5909 Other constipation: Secondary | ICD-10-CM | POA: Insufficient documentation

## 2018-06-22 DIAGNOSIS — G8254 Quadriplegia, C5-C7 incomplete: Secondary | ICD-10-CM

## 2018-06-22 DIAGNOSIS — F329 Major depressive disorder, single episode, unspecified: Secondary | ICD-10-CM | POA: Insufficient documentation

## 2018-06-22 DIAGNOSIS — N319 Neuromuscular dysfunction of bladder, unspecified: Secondary | ICD-10-CM | POA: Insufficient documentation

## 2018-06-22 MED ORDER — MIRTAZAPINE 7.5 MG PO TABS *A*
7.5000 mg | ORAL_TABLET | Freq: Every evening | ORAL | 2 refills | Status: DC
Start: 2018-06-22 — End: 2018-08-30

## 2018-06-22 MED ORDER — ONABOTULINUMTOXINA 100 UNIT IJ SOLR *I*
400.0000 [IU] | Freq: Once | INTRAMUSCULAR | Status: AC
Start: 2018-06-22 — End: 2018-06-22
  Administered 2018-06-22: 400 [IU] via INTRAMUSCULAR

## 2018-06-22 NOTE — Patient Instructions (Signed)
Physical Medicine and Rehabilitation     Botox Patient Information  About Botulinum Toxin injection for Spasticity    Botulinum toxin is produced by bacteria, and has been developed as a medicine to relax over-active muscles in the limbs, neck, face, bladder, and bowel.  It also has uses in various other medical conditions.  It is injected in minute quantities directly into the affected muscles to reduce excessive contraction.  It is also used to reduce sweating and salivation.    Before your procedure    - Botulinum toxin injection is usually performed as a minor procedure in the outpatient clinic.  - Please inform your doctor of any recent health problems, including any recent infections.  - Certain medications may interfere with Botox and therefore it is important to know what medications you are on.  - This procedure does not involve the use of anesthetic before the injection.  - Botox is not suitable treatment if you are pregnant, or "trying" to get pregnant, or breastfeeding.    During the Procedure itself    Injections are given by a trained physician.  Injections are usually given while patients are sitting in a chair or wheelchair, but sometimes is easier to give them while you are laying down on an exam table.  Depending on the severity of the muscle spasm, small doses of Botox are injected just under the skin at one or more different sites around the appropriate muscle.  The exact dose and site of the injection may be varied depending on your response to previous treatments.  After two to four days, the injections may cause temporary weakness in the muscles, but will eventually relieve the symptoms of spasticity for approximately two to six months.    After the Procedure    - After the procedure, which takes 5-20 minutes, you will be able to return home.  - The toxin takes 2-4 days to act and the effect can take from 2-7 days to become noticeable.  - When can you resume normal activities?  Most patients  can resume normal activities right away.  You might need to wait a little longer before resuming more vigorous activity.  - Follow up appointments - Typically we will have you follow up 6 weeks after your injection to assess the response you had.  Injections are typically given every three months, unless found to not be helpful.    - The effect of the injections on spasticity is enhanced when combined with stretching, and so we will refer you for possible physical or occupational therapy.    Benefits of the procedure    - An effective way to reduce pain and decrease muscle spasms.  - The effect is temporary and not a cure.  For continued relief of symptoms, you may require repeated injections.  Regular stretching exercises help to prolong the benefit of the treatment.    Alternatives to the procedure    Other possible treatments include:   - Aggressive stretching of the muscles by physical therapists and followed by you or your family.   - Medications - either single agent or combination of drugs by month  Your doctor will discuss these and other methods of treatment with you as the need arises.    Side effects of injection  - Flu like symptoms  - Joint pain, back pain, muscle pain  - Muscle weakness, tiredness  - A small percentage (5%) do not respond to the Botox injections.  - Distant spread of   the toxin, death  - Injection site pain and bruising - you can use heat or ice on the sites to help with discomfort    PM&R Post Injection/Procedure Instructions  Notify the office [phone] if you experience any of the following after today’s procedure:  • Fever of 100 degrees Farenheit or 38 degrees Celsius or greater  • Excessive swelling or redness at the injection site  • Excessive pain or numbness that lasts longer than 72 hours after your procedure.  Care of the injection site:  • You may ice the injection site for local discomfort.  Ice should be covered with a cloth - Never place directly on skin.  • Apply  ice for 15 minutes on then off for 1 hour.  Repeat as needed.  • Take band aid off any time  Diabetic patients:  • Check your blood sugar level every 8 hours for the next 72 hours.    • Your blood sugar can be elevated due to the steroid used in the injection - call your primary care physician if you go above 300.  Common side effects of injections:  • Bruising and minor swelling at site  • Increased pain  • Numbness for the first 6-8 hours after injection - this should subside or return to baseline  • Increased blood sugar  • Sometimes the medication can cause skin pigment lightening or changes

## 2018-06-22 NOTE — Progress Notes (Signed)
Reason For Visit: The primary encounter diagnosis was Depression. A diagnosis of Muscle spasticity was also pertinent to this visit.      HPI:  Cindy Ochoa is 32 y.o. 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  Denies SI or HI  She would like to restart medication    Frequent UTIs patient has urostomy followed by urology  Urine is currently clear  She reports no signs of infection    Muscle spasticity  Previously on baclofen and discontinued his hospitalization in March.  She is an appointment today with PMR    Denies any skin breakdown    Medications:     Current Outpatient Prescriptions   Medication Sig    mirtazapine (REMERON) 7.5 MG tablet Take 1 tablet (7.5 mg total) by mouth nightly    polyethylene glycol (GLYCOLAX) powder Take 17 g by mouth daily   Mix in 8 oz water or juice and drink.    Non-System Medication Urostomy drainage bags 2000 cc change as needed. Dx N39.46 and  G82.54    Ostomy Supplies [facility] Coloplast Urostomy two piece bag and wafer 1 1/4 "H  7/8 "V N31.9 Neuromuscular dysfunction of the bladder. Use as directed.    disposable underpads 30"x36" (CHUX) Use 6 times daily and PRN. 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 Dx N39.46 and  G82.54    Ostomy Supplies MISC Coloplast Urostomy two piece bag and wafer 1 1/4 "H  7/8 "V N31.9  Neuromuscular dysfunction of the bladder    Ostomy Supplies Pouch MISC Coloplast Urostomy two piece bag and wafer 1 1/4 "H  7/8 "V N31.9 Neuromuscular dysfunction of the bladder Use as directed    Ostomy Supplies MISC Barrier ring. Use as directed.    incontinence supply disposable Large pull ups - use up to 5 x daily  Dx N39.46    disposable gloves 1 box Dynarex PF Vinyl Gloves    Gauze Pads & Dressings (ABDOMINAL PAD) 8"X10" PADS By 1 each no specified route daily   Cover buttock wounds 2x daily    Adhesive Tape (MEDIPORE SURGICAL 2"X10YD) TAPE Secure the 2 buttocks  wound dressings 2x day    Wound Dressings (HYDROFERA BLUE 4"X4") PADS Cut and moisten with saline as directed and pack in to buttock wound daily    Non-System Medication Gel overlay mattress for hospital bed - diagnosis G82.54, L89.93, L89.159    docusate sodium (COLACE) 100 MG capsule Take 1 capsule (100 mg total) by mouth 2 times daily    bisacodyl (BISAC-EVAC) 10 MG suppository Place 1 suppository (10 mg total) rectally daily as needed    acetaminophen (MAPAP) 500 mg tablet Take 1 tablet (500 mg total) by mouth every 4-6 hours as needed  for pain    senna-docusate (PERICOLACE) 8.6-50 MG per tablet Take 2 tablets by mouth daily as needed for Constipation    albuterol HFA 108 (90 Base) MCG/ACT inhaler Inhale 1-2 puffs into the lungs every 6 hours as needed   Shake well before each use.    Non-System Medication Easy Tip Leg Bags 1000mg  - diagnosis G82.53 N39.46    etonogestrel (NEXPLANON) 68 MG IMPL Inject 68 mg into the skin once   Placed 10/20/16    generic DME Use as directed. Hospital bed with Low air loss mattress. Duration of use: 1 year.    Non-System Medication Hospital Bed with Air Mattress - Diagnosis G82.54, L89.93,L89.159  Medicaid [id number]     No current facility-administered medications for this visit.        Medication list reconciled this visit    Allergies:     Allergies   Allergen Reactions    Nitrofurantoin  Nausea And Vomiting    Vancomycin Hives     hives 2006 but tolerated Rx in 2010  pt states she had vancomycin in 04/2012.  Feels this is not true allergy  as she has received it recently - had no reaction     Heparin Other (See Comments)     Thrombocytopenia;        Social history      Social History   Substance Use Topics    Smoking status: Current Every Day Smoker     Packs/day: 0.25     Years: 3.00     Types: Cigarettes     Start date: 12/09/2011     Last attempt to quit: 07/30/2016    Smokeless tobacco: Former Systems developer     Quit date: 04/29/2005      Comment: ~4 cigs a day    Alcohol use No       Review of Systems     ROS  12 point review of system was negative except those mentioned in HPI    Physical Exam:     Physical Exam   Constitutional:   Sitting in a motorized wheelchair  Muscle spasms of lower extremities for the first 5 minutes of her visit.  Urostomy bag has clear yellow urine   Cardiovascular: Normal rate and regular rhythm.    Pulmonary/Chest: Effort normal and breath sounds normal.   Abdominal: Soft. Bowel sounds are normal.        Vitals:    06/22/18 0841   BP: 100/60   Pulse: 84   Temp: 35.6 C (96.1 F)   TempSrc: Temporal   Weight: 76.2 kg (168 lb)   Height: 1.651 m (5\' 5" )     Wt Readings from Last 3 Encounters:   06/22/18 76.2 kg (168 lb)   06/07/18 76.2 kg (168 lb)   05/09/18 76.2 kg (168 lb)     BP Readings from Last 3 Encounters:   06/22/18 100/60   06/07/18 101/64   05/09/18 132/72           RESULTS:       ASSESSMENT/PLAN:     Diagnoses and all orders for this visit:    Depression-restart Remeron 7.5  Follow-up in one to 2 months with CCP to discuss  Sooner if needed    Muscle spasticity-appointment with PMR today    Frequent UTIs-no signs of infection occur in time follow-up with urology    Social worker to discuss Medicaid in [place],  Nurse Practitioner  [facility]
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
## 2018-07-06 ENCOUNTER — Telehealth: Payer: Self-pay | Admitting: Internal Medicine

## 2018-07-06 NOTE — Telephone Encounter (Signed)
Patient stated her insurance faxed over forms last week for her consumer direct program to continue aid services    Patient is calling to see if the fax was received     Please follow up with patient    Patient can be reached at [phone]

## 2018-07-06 NOTE — Telephone Encounter (Signed)
Spoke to patient and was advised that she would like to her CDPAS form to be sent to [employer].     Will draft this form and have NP Bilsback complete.

## 2018-07-07 ENCOUNTER — Encounter: Payer: Self-pay | Admitting: Gastroenterology

## 2018-07-07 NOTE — Telephone Encounter (Signed)
Aide form received and placed it in [place] folder to be cosigned.

## 2018-07-13 NOTE — Telephone Encounter (Signed)
Spoke to patient and was advised that this form need to go to [place] care unit.     Will fax this today.

## 2018-07-15 ENCOUNTER — Telehealth: Payer: Self-pay | Admitting: Internal Medicine

## 2018-07-15 NOTE — Telephone Encounter (Signed)
Cindy Ochoa called in regards to a old wound on her right upper thigh that reopened    Patient is requesting a new referral for wound care to be submitted    Patient can be reached at [phone]

## 2018-07-15 NOTE — Telephone Encounter (Signed)
Home Care referral placed.        Woody Seller, MD  Kendall Flack, RN   Caller: Unspecified (Today, 11:35 AM)            Blake Divine,     I think it would be better to see what it looks like prior to deciding if she needs wound care. Could we have Home Care evaluate?     Thank you!   Opal Sidles

## 2018-07-18 ENCOUNTER — Ambulatory Visit: Payer: MEDICAID | Attending: Physical Medicine and Rehabilitation

## 2018-07-18 DIAGNOSIS — M8589 Other specified disorders of bone density and structure, multiple sites: Secondary | ICD-10-CM

## 2018-07-18 DIAGNOSIS — G8250 Quadriplegia, unspecified: Secondary | ICD-10-CM

## 2018-07-20 NOTE — Procedures (Signed)
Providers: DXA scan for your review.  Images and report are located under the   imaging (  Scans on order )  and/or the media tab ( under dxa/year )  .   For Mychart active patients, please contact your referring provider for results.

## 2018-07-26 ENCOUNTER — Telehealth: Payer: Self-pay | Admitting: Internal Medicine

## 2018-07-26 ENCOUNTER — Encounter: Payer: Self-pay | Admitting: Obstetrics and Gynecology

## 2018-07-26 ENCOUNTER — Ambulatory Visit: Payer: MEDICAID | Admitting: Obstetrics and Gynecology

## 2018-07-26 ENCOUNTER — Encounter: Payer: Self-pay | Admitting: Physical Medicine and Rehabilitation

## 2018-07-26 ENCOUNTER — Ambulatory Visit: Payer: MEDICAID | Attending: Physical Medicine and Rehabilitation | Admitting: Physical Medicine and Rehabilitation

## 2018-07-26 VITALS — BP 96/52 | HR 85 | Ht 65.0 in | Wt 168.0 lb

## 2018-07-26 VITALS — BP 117/69 | HR 114 | Temp 97.7°F

## 2018-07-26 DIAGNOSIS — Z01419 Encounter for gynecological examination (general) (routine) without abnormal findings: Secondary | ICD-10-CM | POA: Insufficient documentation

## 2018-07-26 DIAGNOSIS — B379 Candidiasis, unspecified: Secondary | ICD-10-CM | POA: Insufficient documentation

## 2018-07-26 DIAGNOSIS — M8589 Other specified disorders of bone density and structure, multiple sites: Secondary | ICD-10-CM | POA: Insufficient documentation

## 2018-07-26 DIAGNOSIS — R252 Cramp and spasm: Secondary | ICD-10-CM | POA: Insufficient documentation

## 2018-07-26 DIAGNOSIS — G8253 Quadriplegia, C5-C7 complete: Secondary | ICD-10-CM | POA: Insufficient documentation

## 2018-07-26 DIAGNOSIS — Z113 Encounter for screening for infections with a predominantly sexual mode of transmission: Secondary | ICD-10-CM

## 2018-07-26 DIAGNOSIS — Z3040 Encounter for surveillance of contraceptives, unspecified: Secondary | ICD-10-CM | POA: Insufficient documentation

## 2018-07-26 LAB — POCT VAGINAL WET MOUNT
CLUE CELLS,POC: NEGATIVE
KOH FOR FUNGUS: POSITIVE
TRICHOMONAS, POC: NEGATIVE
WHIFF TEST: NEGATIVE
YEAST,POC: POSITIVE

## 2018-07-26 LAB — N. GONORRHOEAE DNA AMPLIFICATION: N. gonorrhoeae DNA Amplification: 0

## 2018-07-26 LAB — CHLAMYDIA PLASMID DNA AMPLIFICATION: Chlamydia Plasmid DNA Amplification: 0

## 2018-07-26 LAB — TRICHOMONAS DNA AMPLIFICATION: Trichomonas DNA amplification: 0

## 2018-07-26 MED ORDER — FLUCONAZOLE 150 MG PO TABS *I*
150.0000 mg | ORAL_TABLET | Freq: Once | ORAL | 0 refills | Status: AC
Start: 2018-07-26 — End: 2018-07-26

## 2018-07-26 NOTE — Patient Instructions (Signed)
Breast awareness and self-exam         Beginning in their 20s, women should be told about the benefits and limitations of breast self-exam (BSE). Women should be aware of how their breasts normally look and feel and report any new breast changes to a health professional as soon as they are found. Finding a breast change does not necessarily mean there is a cancer.     A woman can notice changes by knowing how her breasts normally look and feel and feeling her breasts for changes (breast awareness), or by choosing to use a step-by-step approach and using a specific schedule to examine her breasts.   Women with breast implants can do BSE. It may be useful to have the surgeon help identify the edges of the implant so that you know what you are feeling. There is some thought that the implants push out the breast tissue and may make it easier to examine. Women who are pregnant or breast-feeding can also choose to examine their breasts regularly.    If you choose to do BSE, the following information provides a step-by-step approach for the exam. The best time for a woman to examine her breasts is when the breasts are not tender or swollen. Women who examine their breasts should have their technique reviewed during their periodic health exams by their health care professional.     It is acceptable for women to choose not to do BSE or to do BSE occasionally. Women who choose not to do BSE should still know how their breasts normally look and feel and report any changes to their doctor right away.    How to examine your breasts  Lie down on your back and place your right arm behind your head. The exam is done while lying down, not standing up. This is because when lying down the breast tissue spreads evenly over the chest wall and is as thin as possible, making it much easier to feel all the breast tissue.    Use the finger pads of the 3 middle fingers on your left hand to feel for lumps in the right breast. Use overlapping  dime-sized circular motions of the finger pads to feel the breast tissue.    Use 3 different levels of pressure to feel all the breast tissue. Light pressure is needed to feel the tissue closest to the skin; medium pressure to feel a little deeper; and firm pressure to feel the tissue closest to the chest and ribs. It is normal to feel a firm ridge in the lower curve of each breast, but, you should tell your doctor if you feel anything else out of the ordinary. If you're not sure how hard to press, talk with your doctor or nurse. Use each pressure level to feel the breast tissue before moving on to the next spot.           Move around the breast in an up and down pattern starting at an imaginary line drawn straight down your side from the underarm and moving across the breast to the middle of the chest bone (sternum or breastbone). Be sure to check the entire breast area going down until you feel only ribs and up to the neck or collar bone (clavicle).        Move around the breast in an up and down pattern starting at an imaginary line drawn straight down your side from the underarm and moving across the breast to the middle   of the chest bone (sternum or breastbone). Be sure to check the entire breast area going down until you feel only ribs and up to the neck or collar bone (clavicle).     There is some evidence to suggest that the up-and-down pattern (sometimes called the vertical pattern) is the most effective pattern for covering the entire breast without missing any breast tissue.   Repeat the exam on your left breast, putting your left arm behind your head and using the finger pads of your right hand to do the exam.     While standing in front of a mirror with your hands pressing firmly down on your hips, look at your breasts for any changes of size, shape, contour, or dimpling, or redness or scaliness of the nipple or breast skin. (The pressing down on the hips position contracts the chest wall muscles and  enhances any breast changes.)    Examine each underarm while sitting up or standing and with your arm only slightly raised so you can easily feel in this area. Raising your arm straight up tightens the tissue in this area and makes it harder to examine.     This procedure for doing breast self-exam is different from previous recommendations. These changes represent an extensive review of the medical literature and input from an expert advisory group. There is evidence that this position (lying down), the area felt, pattern of coverage of the breast, and use of different amounts of pressure increase a woman's ability to find abnormal areas.       Reviewed 10/2011Healthy Practices for Women of all Ages     In addition to your regular OB/GYN history, physical, cholesterol and diabetes screenings, cervical cancer screen, and other recommended cancer screening tests, we ask that you review this list of healthy practices.  Modifying your daily activities according to these practices may improve your overall health and well-being.  In the event of questions about this list, ask your health care provider.  Additionally, there are specially written pamphlets available, which offer further information.    Diet and Exercise:  - Limit fat and cholesterol; emphasize fruits, grains and vegetables.  - Consume dairy products or use calcium supplementation for adequate calcium intake (1200 mg or more).  - Add folic acid supplementation 0.4 mg (400 micrograms, present in most daily multivitamins) at least two months before considering pregnancy to reduce the risks of birth defects.  - Participate in regular exercise for 30 minutes at least five times a week, and consider weight training.    Injury Prevention:  - Seat/lap belts should be worn while in a moving car.  - Helmet should be used when using motorcycles, bicycles, roller blades and ATVs or skiing.  - Place approved smoke detectors in your house and replace the batteries  twice a year.    - Guns and other firearms should be stored unloaded and in a locked area.  Trigger locks should be used as well.  - Consider CPR training for household members.  - Do not Text and Drive.    Dental Health:  - Schedule regular visits to the dentist.  - Floss and brush with fluoride toothpaste daily.    Immunizations:  - A tetanus/diphtheria booster shot (d/T) is recommended every 10 years.  - An MMR vaccine is recommended for non-pregnant women born after 9 without proof of immunity of documentation of previous immunization.  Adults who are susceptible to varicella (chicken pox) should be vaccinated.  - Influenza  vaccine is indicated yearly for all patients.  - Pneumococcal pneumonia vaccine is indicated for age 45 and older once in your life.  - Hepatitis A and/or B vaccines are recommended for high-risk individuals.    Substance Abuse:  - Stop smoking; do not use any other tobacco products.  - Avoid alcohol use when driving, boating, swimming or operating other machinery. Avoid excessive use of alcoholic beverages.  - Recreational drug use (marijuana, cocaine, etc.) is dangerous and can be habit-forming.    Sexual Behavior:  - Be sure to use contraception if pregnancy is not desired.  - Regular use of female or female condoms with spermicide helps prevent STDs.  - Consider HIV testing if:  1. You have had more than one sexual partner.  2. You have had any STDs.  3. You have used intravenous drugs.  4. You have a sexual partner with these (the above) risk factors.  5. Your sexual partner has had female homosexual exposure.  6. You received a blood transfusion during 1978-1985.    Breast Health:  - A mammogram should be done once at age 63, then every 1-2 years based on risk factors.  If there is a strong family history of premenopausal breast cancer, you may need to start with mammograms earlier than age 49.    Colon Cancer Surveillance:  - Beginning at age 25, stool occult blood screening should be  done annually and/or sigmoidoscopy (scope examination of the colon) every 3-5 years.    Women and Alcohol  For women, alcohol use carries some unique and special concerns. Most studies on alcohol have been on men, but we now know women may respond differently to alcohol.   Concerns: Liver damage, cancer, menstrual cycle changes, birth defects, fetal alcohol syndrome, intoxication, nutrition and weight management.  All women should avoid all alcohol during pregnancy.  Help for Problem Drinking:   One in every three members of Alcoholics Anonymous (AA) is now a woman.  AA meetings are held regularly and have led the way in demonstrating the effectiveness of self-help.   Outpatient and Inpatient Treatment are also available.  Ask your health care provider for a referral.   The first step in successful treatment is to recognize that there is a problem and accept help.    Dietary and Supplemental Calcium  It is important that you build and protect your bone mass through a program of regular exercise and proper nutrition with adequate amounts of calcium in your diet. Dairy products are considered to be the most important sources of calcium.

## 2018-07-26 NOTE — Telephone Encounter (Signed)
Anda Kraft, Care Coordinator with [facility] is requesting clarification on if Basilio Cairo is still the patients Mental therapist, because he is also listed as her POA, and health care proxy     Please follow up with Gerrianne Scale can be reached at [phone]
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
## 2018-07-26 NOTE — Telephone Encounter (Signed)
Spoke with Anda Kraft and reported that it does not appear Bas[facility] Cairo is patient's Mental Health therapist. Anda Kraft verbalized understanding and had no further questions.

## 2018-07-28 ENCOUNTER — Telehealth: Payer: Self-pay | Admitting: Physical Medicine and Rehabilitation

## 2018-07-28 NOTE — Telephone Encounter (Signed)
Call to patients listed number. LM for  Ms. Gotto to call office (provided nursing line number for non urgent concerns) to discuss her Dexa scan.  Dexa showed osteopenia. Good improvement from 2015, but since she hasn't been seen by bone metabolism before, Dr. Priscille Loveless placed a referral for them to offer potential treatment options.

## 2018-08-03 ENCOUNTER — Other Ambulatory Visit
Admission: RE | Admit: 2018-08-03 | Discharge: 2018-08-03 | Disposition: A | Discharge status: Home / Self Care | Payer: MEDICAID | Source: Ambulatory Visit | Attending: Obstetrics and Gynecology | Admitting: Obstetrics and Gynecology

## 2018-08-03 DIAGNOSIS — N390 Urinary tract infection, site not specified: Secondary | ICD-10-CM | POA: Insufficient documentation

## 2018-08-03 DIAGNOSIS — Z113 Encounter for screening for infections with a predominantly sexual mode of transmission: Secondary | ICD-10-CM | POA: Insufficient documentation

## 2018-08-03 LAB — MULTIPLE ORDERING DOCS

## 2018-08-04 ENCOUNTER — Telehealth: Payer: Self-pay

## 2018-08-04 LAB — HIV 1&2 ANTIGEN/ANTIBODY: HIV 1&2 ANTIGEN/ANTIBODY: NONREACTIVE

## 2018-08-04 LAB — HEPATITIS C ANTIBODY: Hep C Ab: NEGATIVE

## 2018-08-04 LAB — SYPHILIS SCREEN
Syphilis Screen: NEGATIVE
Syphilis Status: NONREACTIVE

## 2018-08-04 LAB — HEPATITIS B SURFACE ANTIGEN: HBV S Ag: NEGATIVE

## 2018-08-04 NOTE — Telephone Encounter (Signed)
Second attempt to reach patient. Call to patients listed number. LM for  Cindy Ochoa to call office (provided nursing line number for non urgent concerns) to discuss her Dexa scan.  Dexa showed osteopenia. Good improvement from 2015, but since she hasn't been seen by bone metabolism before, Dr. Priscille Loveless placed a referral for them to offer potential treatment options.

## 2018-08-05 ENCOUNTER — Telehealth: Payer: Self-pay | Admitting: Urology

## 2018-08-05 NOTE — Telephone Encounter (Signed)
Cindy Ochoa,  Just FYI  Spoke to [employer]) and advised her sample came from a Urostomy. She stated they would re-work it up again. Attempted to reach patient and left a message requesting a return call to let us know if she is having UTI symptoms. Awaiting return call.  Kermit Balo

## 2018-08-05 NOTE — Telephone Encounter (Signed)
Nursing, can you please contact patietn to see if she is symptomatic for UTI? Please also contact lab and make sure that they know this sample came from a Urostomy. Thanks!

## 2018-08-08 LAB — AEROBIC CULTURE

## 2018-08-10 ENCOUNTER — Encounter: Payer: Self-pay | Admitting: Emergency Medicine

## 2018-08-10 ENCOUNTER — Observation Stay
Admission: EM | Admit: 2018-08-10 | Discharge: 2018-08-11 | Discharge status: Against Medical Advice | Payer: MEDICAID | Source: Ambulatory Visit | Attending: Internal Medicine | Admitting: Internal Medicine

## 2018-08-10 ENCOUNTER — Telehealth: Payer: Self-pay | Admitting: Urology

## 2018-08-10 DIAGNOSIS — R509 Fever, unspecified: Secondary | ICD-10-CM

## 2018-08-10 DIAGNOSIS — K592 Neurogenic bowel, not elsewhere classified: Secondary | ICD-10-CM | POA: Insufficient documentation

## 2018-08-10 DIAGNOSIS — N319 Neuromuscular dysfunction of bladder, unspecified: Secondary | ICD-10-CM | POA: Insufficient documentation

## 2018-08-10 DIAGNOSIS — S24102S Unspecified injury at T2-T6 level of thoracic spinal cord, sequela: Secondary | ICD-10-CM

## 2018-08-10 DIAGNOSIS — G8250 Quadriplegia, unspecified: Secondary | ICD-10-CM | POA: Insufficient documentation

## 2018-08-10 DIAGNOSIS — L89214 Pressure ulcer of right hip, stage 4: Secondary | ICD-10-CM | POA: Insufficient documentation

## 2018-08-10 DIAGNOSIS — R61 Generalized hyperhidrosis: Secondary | ICD-10-CM

## 2018-08-10 DIAGNOSIS — L89309 Pressure ulcer of unspecified buttock, unspecified stage: Secondary | ICD-10-CM | POA: Insufficient documentation

## 2018-08-10 DIAGNOSIS — F329 Major depressive disorder, single episode, unspecified: Secondary | ICD-10-CM | POA: Insufficient documentation

## 2018-08-10 DIAGNOSIS — Z87820 Personal history of traumatic brain injury: Secondary | ICD-10-CM | POA: Insufficient documentation

## 2018-08-10 DIAGNOSIS — L89154 Pressure ulcer of sacral region, stage 4: Secondary | ICD-10-CM | POA: Insufficient documentation

## 2018-08-10 DIAGNOSIS — R200 Anesthesia of skin: Secondary | ICD-10-CM

## 2018-08-10 DIAGNOSIS — N390 Urinary tract infection, site not specified: Principal | ICD-10-CM | POA: Insufficient documentation

## 2018-08-10 DIAGNOSIS — Z5329 Procedure and treatment not carried out because of patient's decision for other reasons: Secondary | ICD-10-CM | POA: Insufficient documentation

## 2018-08-10 LAB — URINE MICROSCOPIC (IQ200)
RBC,UA: 5 /hpf — AB (ref 0–2)
WBC,UA: 49 /hpf — ABNORMAL HIGH (ref 0–5)

## 2018-08-10 LAB — CBC AND DIFFERENTIAL
Baso # K/uL: 0 10*3/uL (ref 0.0–0.1)
Basophil %: 0.2 %
Eos # K/uL: 0 10*3/uL (ref 0.0–0.4)
Eosinophil %: 0.2 %
Hematocrit: 43 % (ref 34–45)
Hemoglobin: 14 g/dL (ref 11.2–15.7)
IMM Granulocytes #: 0 10*3/uL
IMM Granulocytes: 0.3 %
Lymph # K/uL: 3 10*3/uL (ref 1.2–3.7)
Lymphocyte %: 31 %
MCH: 30 pg/cell (ref 26–32)
MCHC: 33 g/dL (ref 32–36)
MCV: 93 fL (ref 79–95)
Mono # K/uL: 0.8 10*3/uL (ref 0.2–0.9)
Monocyte %: 8.2 %
Neut # K/uL: 5.9 10*3/uL (ref 1.6–6.1)
Nucl RBC # K/uL: 0 10*3/uL (ref 0.0–0.0)
Nucl RBC %: 0 /100 WBC (ref 0.0–0.2)
Platelets: 70 10*3/uL — ABNORMAL LOW (ref 160–370)
RBC: 4.6 MIL/uL (ref 3.9–5.2)
RDW: 14.5 % — ABNORMAL HIGH (ref 11.7–14.4)
Seg Neut %: 60.1 %
WBC: 9.7 10*3/uL (ref 4.0–10.0)

## 2018-08-10 LAB — PLASMA PROF 7 (ED ONLY)
Anion Gap,PL: 13 (ref 7–16)
CO2,Plasma: 22 mmol/L (ref 20–28)
Chloride,Plasma: 104 mmol/L (ref 96–108)
Creatinine: 0.44 mg/dL — ABNORMAL LOW (ref 0.51–0.95)
GFR,Black: 153 *
GFR,Caucasian: 133 *
Glucose,Plasma: 105 mg/dL — ABNORMAL HIGH (ref 60–99)
Potassium,Plasma: 3.2 mmol/L — ABNORMAL LOW (ref 3.4–4.7)
Sodium,Plasma: 139 mmol/L (ref 133–145)
UN,Plasma: 13 mg/dL (ref 6–20)

## 2018-08-10 LAB — RUQ PANEL (ED ONLY)
Albumin: 4.5 g/dL (ref 3.5–5.2)
Alk Phos: 66 U/L (ref 35–105)
Amylase: 51 U/L (ref 28–100)
Bilirubin,Direct: <0.2 mg/dL (ref 0.0–0.3)
Bilirubin,Total: 0.5 mg/dL (ref 0.0–1.2)
Lipase: 31 U/L (ref 13–60)
Total Protein: 8.1 g/dL — ABNORMAL HIGH (ref 6.3–7.7)

## 2018-08-10 LAB — URINALYSIS REFLEX TO CULTURE
Ketones, UA: NEGATIVE
Nitrite,UA: POSITIVE — AB
Protein,UA: 30 mg/dL — AB
Specific Gravity,UA: 1.015 (ref 1.002–1.030)
pH,UA: 6 (ref 5.0–8.0)

## 2018-08-10 LAB — PHOSPHORUS: Phosphorus: 3.2 mg/dL (ref 2.7–4.5)

## 2018-08-10 LAB — LACTATE, PLASMA: Lactate: 1 mmol/L (ref 0.5–2.2)

## 2018-08-10 LAB — MAGNESIUM: Magnesium: 1.9 mg/dL (ref 1.6–2.5)

## 2018-08-10 LAB — HOLD BLUE

## 2018-08-10 LAB — CALCIUM: Calcium: 9.6 mg/dL (ref 8.8–10.2)

## 2018-08-10 MED ORDER — PIPERACILLIN/TAZOBACTAM 3.375 G IN NS MINI-BAG PLUS 110 ML *I*
3.3750 g | Freq: Four times a day (QID) | INTRAVENOUS | Status: DC
Start: 2018-08-10 — End: 2018-08-11
  Administered 2018-08-10 – 2018-08-11 (×4): 3.375 g via INTRAVENOUS
  Filled 2018-08-10 (×8): qty 110

## 2018-08-10 MED ORDER — DOCUSATE SODIUM 100 MG PO CAPS *I*
100.0000 mg | ORAL_CAPSULE | Freq: Two times a day (BID) | ORAL | Status: DC
Start: 2018-08-10 — End: 2018-08-11
  Filled 2018-08-10: qty 1

## 2018-08-10 MED ORDER — SODIUM CHLORIDE 0.9 % IV BOLUS *I*
1000.0000 mL | Freq: Once | Status: AC
Start: 2018-08-10 — End: 2018-08-10
  Administered 2018-08-10: 1000 mL via INTRAVENOUS

## 2018-08-10 MED ORDER — BISACODYL 10 MG RE SUPP *I*
10.0000 mg | RECTAL | Status: DC
Start: 2018-08-11 — End: 2018-08-11

## 2018-08-10 MED ORDER — ACETAMINOPHEN 500 MG PO TABS *I*
500.0000 mg | ORAL_TABLET | Freq: Four times a day (QID) | ORAL | Status: DC | PRN
Start: 2018-08-10 — End: 2018-08-11

## 2018-08-10 MED ORDER — MIRTAZAPINE 15 MG PO TABS *I*
7.5000 mg | ORAL_TABLET | Freq: Every evening | ORAL | Status: DC
Start: 2018-08-10 — End: 2018-08-11
  Filled 2018-08-10: qty 1

## 2018-08-10 MED ORDER — CEFTRIAXONE SODIUM 1 G IN STERILE WATER 10ML SYRINGE *I*
1000.0000 mg | Freq: Once | INTRAVENOUS | Status: AC
Start: 2018-08-10 — End: 2018-08-10
  Administered 2018-08-10: 1000 mg via INTRAVENOUS
  Filled 2018-08-10: qty 10

## 2018-08-10 MED ORDER — PLASMA-LYTE IV SOLN *WRAPPED*
100.0000 mL/h | Status: DC
Start: 2018-08-10 — End: 2018-08-11
  Administered 2018-08-10: 100 mL/h via INTRAVENOUS
  Administered 2018-08-10 (×2): 100 mL/h
  Administered 2018-08-10: 100 mL/h via INTRAVENOUS
  Administered 2018-08-10 – 2018-08-11 (×3): 100 mL/h
  Administered 2018-08-11: 100 mL/h via INTRAVENOUS

## 2018-08-10 MED ORDER — ALBUTEROL SULFATE (2.5 MG/3ML) 0.083% IN NEBU *I*
2.5000 mg | INHALATION_SOLUTION | RESPIRATORY_TRACT | Status: DC | PRN
Start: 2018-08-10 — End: 2018-08-11

## 2018-08-10 MED ORDER — POLYETHYLENE GLYCOL 3350 PO PACK 17 GM *I*
17.0000 g | PACK | Freq: Every day | ORAL | Status: DC
Start: 2018-08-10 — End: 2018-08-11
  Filled 2018-08-10: qty 17

## 2018-08-10 NOTE — ED Notes (Signed)
Pt became diaphoretic and PIV fell out. Pt is a difficult IV stick. IV team was paged and awaiting a call back. Temp 98.4 orally.
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
## 2018-08-10 NOTE — ED Notes (Signed)
Assumed care of pt. Pt is currently in bed and watching television. Pt is not currently febrile but endorses feeling warm and has a fan running to accommodate this discomfort. Pt is also being placed in a LAM to prevent any further skin breakdown and enhance comfort.This Probation officer is awaiting a return call from the IV team to replace PIV line that was removed earlier.

## 2018-08-10 NOTE — Telephone Encounter (Signed)
Patient currently in ED.

## 2018-08-10 NOTE — ED Notes (Signed)
Pt presents to Ed with c/o fever and chills. Pt questions UTI.

## 2018-08-10 NOTE — Progress Notes (Signed)
Home Health Assessment    Completed by: Clemens Catholic, RN  Phone: [phone]    Referred by: Riki Altes [facility] RN ED      Source of Information: medical record      Home Health indicators present: May need PT, OT or SN at home      Barriers to discharge to be addressed:no barriers noted at this time to be addressed ongoing      Plan:  [place] care aware of home care referral. [facility] will  continue to follow progress towards discharge, and will assist in planning a safe discharge to  Home with appropriate services.    Clemens Catholic RN, [place]  [facility] [phone]
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
## 2018-08-10 NOTE — Progress Notes (Signed)
08/10/18 1800   UM Patient Class Review   Patient Class Review Inpatient   Patient class effective as of 08/10/18    Vivan Vanderveer [place] RN  Utilization Management  [phone]  Pager: [phone]

## 2018-08-10 NOTE — Telephone Encounter (Signed)
Patient called this morning before clinic office hours through the answering service. She indicated that she was having high fevers. She indicated that provided a urine sample last week. Unclear if she was treated.     I instructed her to come to the ED if she was having high fevers for further evaluation.     [name] FYI.  Not sure if Urology Nursing was ever able to reach patient after initial attempt.

## 2018-08-10 NOTE — Progress Notes (Signed)
Medicine care coordinator ED-chart review done   Patient seen,very nice lady   Has own motorized wheelchair here in room    Patient reports she lives in own apartment with her 33 yr old son   Patient has CDPAP aide service 16 hours a day -she reports she calls herself to get arranged  Discharge date not yet known   Patient did not have home care nurse prior to admit but she is agreeable to follow up this time   Patient is agreeable to [place] aware of referral   [phone]

## 2018-08-10 NOTE — ED Triage Notes (Signed)
States has noted fever and chills and ?may have a UTI". Hx. Quadriplegia.       Triage Note   Jola Baptist, RN

## 2018-08-10 NOTE — ED Notes (Signed)
Report Given To  Lowella Dell RN      Descriptive Sentence / Reason for Admission   Pt presents to ED with c/o fever and chills. Pt questions UTI.       Active Issues / Relevant Events    A&O x4   Quadriplegia   Fever   Chills    Diaphoretic   Tele      Urostomy          To Do List   Medications and labs per provider order    Vital signs and assessments every 4 hours          Anticipatory Guidance / Discharge Planning  Dispo pending

## 2018-08-10 NOTE — ED Notes (Signed)
Low air loss bed ordered for patient.

## 2018-08-10 NOTE — ED Notes (Signed)
Report Given To  Wilburn Cornelia, RN      Descriptive Sentence / Reason for Admission   Pt presents to ED with c/o fever and chills. Pt has concerns for UTI.       Active Issues / Relevant Events    Fever   Chills    Diaphoretic      Urostomy    Pressure ulcer R. ischium   A&O x4, Full code, Reg diet, Quadriplegia      To Do List   Continuous tele   Plasmalyte 168mL/hr   ATB regimen   Wound care to pressure ulcer   Medications and labs per provider order    Vital signs and assessments every 4 hours    I&O q shift      Anticipatory Guidance / Discharge Planning  Admit: UTI

## 2018-08-10 NOTE — ED Notes (Addendum)
Pt declined miralax 17g po.

## 2018-08-11 ENCOUNTER — Inpatient Hospital Stay: Payer: MEDICAID | Admitting: Radiology

## 2018-08-11 LAB — BASIC METABOLIC PANEL
Anion Gap: 13 (ref 7–16)
CO2: 21 mmol/L (ref 20–28)
Calcium: 9.2 mg/dL (ref 8.8–10.2)
Chloride: 108 mmol/L (ref 96–108)
Creatinine: 0.48 mg/dL — ABNORMAL LOW (ref 0.51–0.95)
GFR,Black: 149 *
GFR,Caucasian: 129 *
Glucose: 119 mg/dL — ABNORMAL HIGH (ref 60–99)
Lab: 10 mg/dL (ref 6–20)
Potassium: 3.7 mmol/L (ref 3.3–5.1)
Sodium: 142 mmol/L (ref 133–145)

## 2018-08-11 LAB — CBC AND DIFFERENTIAL
Baso # K/uL: 0 10*3/uL (ref 0.0–0.1)
Basophil %: 0.4 %
Eos # K/uL: 0.1 10*3/uL (ref 0.0–0.4)
Eosinophil %: 1.2 %
Hematocrit: 38 % (ref 34–45)
Hemoglobin: 12.4 g/dL (ref 11.2–15.7)
IMM Granulocytes #: 0 10*3/uL
IMM Granulocytes: 0.2 %
Lymph # K/uL: 2.9 10*3/uL (ref 1.2–3.7)
Lymphocyte %: 35.7 %
MCH: 30 pg/cell (ref 26–32)
MCHC: 33 g/dL (ref 32–36)
MCV: 92 fL (ref 79–95)
Mono # K/uL: 0.6 10*3/uL (ref 0.2–0.9)
Monocyte %: 7.9 %
Neut # K/uL: 4.4 10*3/uL (ref 1.6–6.1)
Nucl RBC # K/uL: 0 10*3/uL (ref 0.0–0.0)
Nucl RBC %: 0 /100 WBC (ref 0.0–0.2)
Platelets: 74 10*3/uL — ABNORMAL LOW (ref 160–370)
RBC: 4.1 MIL/uL (ref 3.9–5.2)
RDW: 14.5 % — ABNORMAL HIGH (ref 11.7–14.4)
Seg Neut %: 54.6 %
WBC: 8.1 10*3/uL (ref 4.0–10.0)

## 2018-08-11 MED ORDER — AMOXICILLIN 500 MG PO CAPS *I*
500.0000 mg | ORAL_CAPSULE | Freq: Three times a day (TID) | ORAL | 0 refills | Status: AC
Start: 2018-08-11 — End: 2018-08-24

## 2018-08-11 MED ORDER — CEPHALEXIN 500 MG PO CAPS *I*
500.0000 mg | ORAL_CAPSULE | Freq: Three times a day (TID) | ORAL | 0 refills | Status: AC
Start: 2018-08-11 — End: 2018-08-24

## 2018-08-11 NOTE — Consults (Signed)
Wound Consult Note, Initial    Patient Unit: [address]  32 y.o. year old female was referred by Papastamelos, Celesta Gentile, MD for evaluation and treatment recommendations regarding: sacrum, right ischium.   HPI, PMH/PSH, Labs, MAR reviewed.      Patient c/o:    []  Pain  []  Burning   []  Itching  []  Odor  []  No complaints at this time  Patient complains that she is not here for a wound assessment and wishes we would just take care of the reason she is at the hospital.    Exam    Patient alert, awake, angry.  Head-to-toe skin assessment: not completed.  Last Braden risk assessment score: 12    Wound #1:   Location: Sacrum   Stage: old stage 4, reopened  Suspected underlying etiology/wound type: pressure  Measurements:  approx 0.5 x 0.5 x 0.3 cm  Undermining:  None  Tunneling: None  Wound bed:  Dry pink  Drainage:  Minimal serosanginous strike through on allevyn dressing placed by nursing  Edges:  Macerated, white  Periwound skin:  intact   Odor after cleansing: None     Wound #2:   Location: right ischium   Stage: old stage 4, reopened   Suspected underlying etiology/wound type: pressure  Measurements:  approx 0.3 x 0.3 x 0.3 cm  Undermining:  None  Tunneling: None  Wound bed:  Dry pink  Drainage:  Minimal serosanginous strike through on allevyn dressing placed by nursing  Edges:  Macerated, white  Periwound skin:  intact   Odor after cleansing: None      Assessment  32 year old Tetraplegic with 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 She is here for UTI. She initially agreed to allow me to assess her wounds but got angry when I updated her that the wounds were open again. "No they are not open!" She would not give much history about her wounds. According to the chart, she was discharged from the wound clinic in April 2019 when both of these sites healed. I viewed the photos in her chart and the wounds are open again and do not look like the healed photos. White  maceration around each wounds tells me the wound have been draining for some time and the drainage is damaging the periwound skin. The wounds do not appear infected, not malodorous, drainage is yellowish on the allevyn placed by nursing.     Plan  Place allevyn 3x3 over each site, change daily  Referral to wound clinic upon discharge  Turn and position every two hours and PRN  LAL mattress  Offloading:  Use pillows to turn  Topical recommendations:     Sacrum - Cleanse wound bed with normal saline, Apply allevyn gentle 3x3. Change every day & PRN   Right ischium - Cleanse wound bed with normal saline, Apply allevyn gentle 3x3. Change every day & PRN    Thank you very much for consult.  Wound Nurse will follow-up as needed.   Please call with any questions or concerns.    Nanine Means, CWON

## 2018-08-11 NOTE — Progress Notes (Signed)
Writer spoke with patient about Home care referral. Pt at this time does not feel it is needed at this time. Pt is knowledgeable regarding medications, wound care and s/s of UTI. Writer to cancel referral per patient request.     Angela Burke, [place]  Weekends/Holidays: [phone]  Afterhours: [phone], Option 4

## 2018-08-11 NOTE — Progress Notes (Signed)
Report Given To   Imagene Sheller, RN       Descriptive Sentence / Reason for Admission     UTI    Hx. Quadriparesis  Neurogenic bladder, muscle spacticity        Active Issues / Relevant Events     RA  Tele: NSR  Urostomy, RLQ to foley bag  Unstageable ulcer on sacrum   Wound consult ordered  Q2 turn, LALM  Movement to UE's, no movement LEs  PIV L upper arm- per IV team  Plasmalyte 100cc/hr  Diet: regular  Safety: Hover/ lift      To Do List  Q2 turn- may be refused by patient  Tele  Vitals  I&O  Wound care            Anticipatory Guidance / Discharge Planning  Per team     Shelbie Proctor, RN
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
## 2018-08-11 NOTE — Progress Notes (Signed)
Nurse and WOCN entered room to give suppository and provide wound care for preexisting sacral ulcers. Patient rolled and WOCN indicated that there were 2 open areas, patient stated that "they are not open, I didn't come in for this, I don't want anything changed to the dressing, call the doctor so I can sign myself out, I can do the ultrasound outpatient." Writer asked if there was something she was upset about, or that we could change or help with, patient said "no I just don't need to be here anymore, I want to sign myself out." Writer paged covering provider as requested, waiting for response. Patient currently on IV abx.    Thomes Cake, RN

## 2018-08-11 NOTE — Plan of Care (Signed)
Nutrition    • Patient's nutritional status is maintained or improved Adequate for discharge        Pain/Comfort    • Patient's pain or discomfort is manageable Adequate for discharge        Safety    • Patient will remain free of falls Adequate for discharge

## 2018-08-11 NOTE — Plan of Care (Signed)
Pt requested to leave AMA this afternoon prior to completion of discharge documentation. AMA form dispensed to patient by [facility] staff. Pt was leaving with mother as I arrived to the floor, she refused to review and complete the AMA form together. Pt was made aware that her antibiotics were ordered to the [facility] pharmacy and advised to pick them up on the way out.

## 2018-08-11 NOTE — Plan of Care (Signed)
Nutrition     Patient's nutritional status is maintained or improved Maintaining        Pain/Comfort     Patient's pain or discomfort is manageable Maintaining        Safety     Patient will remain free of falls Maintaining          Skin Integrity     LTG - patient will demonstrate appropriate skin care techniques Progressing towards goal     STG - Patient exhibits signs of wound healing. Progressing towards goal     STG - patient demonstrates pressure reduction techniques Progressing towards goal     STG - Patient demonstrates preventative skin care measures Progressing towards goal
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
## 2018-08-11 NOTE — Progress Notes (Signed)
06-1199 AMA Discharge Note    Discharged to: Home - AMA    Date/Time: 8/15 1436    Discharged with any Lines/Drains/Tubes: No    Home Care: None    Supplies given: None    IV access removed: Yes    Medications: 2 antibiotic scripts send to outpatient pharmacy    Transportation: Left with Mom    The plan was for the team to discharge pt on po antibiotics.  Pt came out to nurses station stating she needed to leave and could not wait.  Dede Query, MD was paged.  After waiting a few minutes patient stated she could not wait any longer.  IV was removed.  It was explained to patient that she needed to wait for AMA paperwork to be filled out by provider before she could sign and leave.  Patient stated she wasn't waiting and patient's mother signed blank paperwork for her.  At this time Dr. Bari Mantis arrived to 712 and informed patient she could finish discharge paperwork, but pt stated she had to go.  Writer ensured antibiotics scripts were ready for pick-up at outpatient pharmacy.  Patient and mother left unit.  Leona Carry, RN

## 2018-08-11 NOTE — Progress Notes (Signed)
06-1199 Admission Note    Admitted from: ED    Date/Time: 8/14, 2200    Mentation:A&Ox4    Oxygen needs: none    Tele: NSR    Lines/Drain: PIV L. Upper arm    Fluids/Gtts: Plasmalyte @ 100cc/hr    Pain: None, denies    Incisions/Wounds: Sacral ulcer, unstageable    Four-Eyed Skin Assessment Completed With: [place] RN. Ulcer on sacrum noted. Wound consult in place    Safety:  Hover mat/ sarah lift    Other Issues: q2 T&R, LALM    The patient was oriented to the unit, room, call bell, phone, and unit policies.         Shelbie Proctor, RN

## 2018-08-12 ENCOUNTER — Telehealth: Payer: Self-pay | Admitting: Internal Medicine

## 2018-08-12 LAB — AEROBIC CULTURE

## 2018-08-12 NOTE — Telephone Encounter (Signed)
Care Management Transition Care Assessment    Date of Discharge Call:  08/12/2018    Reason for admission:  UTI    Attempted to reach pt but the VM was full.  Will try again later.

## 2018-08-12 NOTE — Telephone Encounter (Signed)
Review of AVS:    Date of Discharge:    08/11/18    Reason for admission:    UTI (urinary tract infection)     Discharged to :     Pt Left AMA    On Coumadin or Warfarin:  no    Follow up appointment:    08/22/2018 1:30 PM Earl Gala, MD Dermatology at [facility]   08/23/2018 2:40 PM Ezequiel Ganser, MD [facility]   09/26/2018 8:30 AM Alena Bills, MD Physical Medicine & Rehabilitation   11/29/2018 11:30 AM Otilio Carpen, PA [facility] Orthopaedics and Rehab at [facility]   12/23/2018 10:45 AM RIS, RR US5 (RRUS5) [facility] at [facility]   01/03/2019 10:30 AM Alden Server, MD [facility] Urology @ [facility]   01/04/2019 12:45 PM Alena Bills, MD Physical Medicine & Rehabilitation

## 2018-08-15 ENCOUNTER — Telehealth: Payer: Self-pay | Admitting: Physical Medicine and Rehabilitation

## 2018-08-15 LAB — BLOOD CULTURE: Bacterial Blood Culture: 0

## 2018-08-15 NOTE — Telephone Encounter (Signed)
Spoke with patient reviewed Dexa scan results, provided bone metabolism clinic number.

## 2018-08-16 LAB — BLOOD CULTURE

## 2018-08-16 NOTE — Telephone Encounter (Signed)
Care Management Transition Care Assessment    Date of Discharge Call:  08/16/2018    Reason for admission:  UTI                         Patient Assessment:    Do you have any health concerns since your discharge? :  Pt states she has been feeling much better, no concerns.    Do you have all of your medications?  yes    Do you have any problems taking your medications?  No        Are you on any anticoagulation medication?  no    Are you having any problems getting around your house ? Do you need to use an assistive device ?  No    Do you have any issues that cause you stress ?   No      Who are the most important people in your life that provide you support?  Mom    Transportation plan:  Pt able to arrange independently.    Plan for Follow up appointment:  08/23/2018 with Dr. Sander Nephew

## 2018-08-22 ENCOUNTER — Ambulatory Visit: Payer: MEDICAID | Admitting: Dermatology

## 2018-08-23 ENCOUNTER — Ambulatory Visit: Payer: MEDICAID | Admitting: Student in an Organized Health Care Education/Training Program

## 2018-08-26 ENCOUNTER — Other Ambulatory Visit: Payer: Self-pay | Admitting: Internal Medicine

## 2018-08-26 NOTE — Progress Notes (Signed)
Unable to reach pt        Immunizations:  Medicare?  no      Tdap:                               Medicare to local pharmacy                               If > 33 yo   Overdue: Yes        Agrees to schedule:  not asked    *If none Tdap x 1       Order code: [id number]   If Tdap, then Td every 10 years     Order code: [id number]        Information:  For Tdap and Zostavax with Medicare:  Medicare and/or dual eligible patients should be directed to their pharmacy to receive these vaccines (a copay may apply - though usually minimal).   • Local pharmacies that have all vaccinations available include:   o [facility], [facility], [facility], [facility], [facility] (dependent upon location)

## 2018-08-30 ENCOUNTER — Ambulatory Visit
Payer: MEDICAID | Attending: Internal Medicine | Admitting: Student in an Organized Health Care Education/Training Program

## 2018-08-30 ENCOUNTER — Other Ambulatory Visit
Admission: RE | Admit: 2018-08-30 | Discharge: 2018-08-30 | Disposition: A | Discharge status: Home / Self Care | Payer: MEDICAID | Source: Ambulatory Visit

## 2018-08-30 ENCOUNTER — Encounter: Payer: Self-pay | Admitting: Student in an Organized Health Care Education/Training Program

## 2018-08-30 VITALS — BP 110/80 | HR 104 | Temp 97.5°F | Ht 65.0 in | Wt 168.0 lb

## 2018-08-30 DIAGNOSIS — K5909 Other constipation: Secondary | ICD-10-CM | POA: Insufficient documentation

## 2018-08-30 DIAGNOSIS — F329 Major depressive disorder, single episode, unspecified: Secondary | ICD-10-CM

## 2018-08-30 DIAGNOSIS — F32A Depression, unspecified: Secondary | ICD-10-CM

## 2018-08-30 DIAGNOSIS — N390 Urinary tract infection, site not specified: Secondary | ICD-10-CM

## 2018-08-30 DIAGNOSIS — N200 Calculus of kidney: Secondary | ICD-10-CM | POA: Insufficient documentation

## 2018-08-30 MED ORDER — POLYETHYLENE GLYCOL 3350 PO POWD *I*
17.0000 g | Freq: Every day | ORAL | 5 refills | Status: DC
Start: 2018-08-30 — End: 2020-10-16

## 2018-08-30 MED ORDER — MIRTAZAPINE 7.5 MG PO TABS *A*
7.5000 mg | ORAL_TABLET | Freq: Every evening | ORAL | 2 refills | Status: DC
Start: 2018-08-30 — End: 2019-02-08

## 2018-08-30 MED ORDER — SENNOSIDES-DOCUSATE SODIUM 8.6-50 MG PO TABS *I*
2.0000 | ORAL_TABLET | Freq: Every day | ORAL | 5 refills | Status: DC | PRN
Start: 2018-08-30 — End: 2020-10-16

## 2018-08-30 MED ORDER — DOCUSATE SODIUM 100 MG PO CAPS *I*
100.0000 mg | ORAL_CAPSULE | Freq: Two times a day (BID) | ORAL | 5 refills | Status: DC
Start: 2018-08-30 — End: 2019-02-08

## 2018-08-30 MED ORDER — BISACODYL 10 MG RE SUPP *I*
10.0000 mg | Freq: Every day | RECTAL | 3 refills | Status: DC | PRN
Start: 2018-08-30 — End: 2019-02-08

## 2018-08-30 NOTE — Progress Notes (Signed)
Internal Medicine Clinic Progress Note    Reason For Visit:   Chief Complaint   Patient presents with    Urinary Tract Infection       Subjective:     History of Present Illness:  Cindy Ochoa is 32 y.o. female with a PMHx of autonomic dysfunction, depression, frequent UTIs, muscle spasticity, neurogenic bladder, and quadriplegia at C6, presents today for follow-up.     Urinary tract infection  Patient was recently discharged for UTI on 08/11/18. Inpatient team recommended patient be referred to infectious disease. Patient is here interested in a referral because she has had UTIs often. She feels that her UTI has not fully resolved despite antibiotics (amoxicillin and keflex courses) because she is tachycardic, feels her urine is still dark, and has had severe diaphoresis. She is unable to sense any symptoms such as burning with urination.     Kidney stone  She has hx of a renal calculus on L side s/p ESWL in 2016. Patient says she has a radiology appointment in December but was wondering if there was a way to evaluate if she has a kidney stone earlier. She is unable to sense any symptoms for it.    Constipation  Patient is interested in refilling her constipation medication. She currently has only her suppository for the last 2 weeks. Her last bowel movement was a week ago. Her mother felt that she did have stool in her rectum, but she was just not having any additional bowel movements.     Services   Patient has a shallow pressure ulcer on R butt. She has an aide who comes to the home. She was told that it is not severe enough for wound care clinic. She is wondering if there could be someone who comes to the home for a wound care evaluation.     Mood  Patient says that her mood has been on and off. Her son's grandmother passed away recently, and they were close. She says she is interested in refilling her mirtazapine as she has not had it in the last month.    Muscle spasm  Patient is now off baclofen. She felt  her first botox shot was unhelpful but feels may be helpful with the next dose as was told they would increase the strength.     Patient's medications, allergies, past medical, surgical, social and family histories were reviewed and updated as appropriate.    Outpatient Prescriptions Marked as Taking for the 08/30/18 encounter (Office Visit) with Ezequiel Ganser, MD   Medication Sig Dispense Refill    bisacodyl (BISAC-EVAC) 10 MG suppository Place 1 suppository (10 mg total) rectally daily as needed 50 suppository 3    polyethylene glycol (GLYCOLAX) powder Take 17 g by mouth daily   Mix in 8 oz water or juice and drink. 255 g 5    senna-docusate (PERICOLACE) 8.6-50 MG per tablet Take 2 tablets by mouth daily as needed for Constipation 30 tablet 5    docusate sodium (COLACE) 100 MG capsule Take 1 capsule (100 mg total) by mouth 2 times daily 60 capsule 5    mirtazapine (REMERON) 7.5 MG tablet Take 1 tablet (7.5 mg total) by mouth nightly 30 tablet 2    [DISCONTINUED] mirtazapine (REMERON) 7.5 MG tablet Take 1 tablet (7.5 mg total) by mouth nightly 30 tablet 2    [DISCONTINUED] polyethylene glycol (GLYCOLAX) powder Take 17 g by mouth daily   Mix in 8 oz water or juice and drink. [place]  g 5    acetaminophen (MAPAP) 500 mg tablet Take 1 tablet (500 mg total) by mouth every 4-6 hours as needed   for pain 180 tablet 5    [DISCONTINUED] docusate sodium (COLACE) 100 MG capsule Take 1 capsule (100 mg total) by mouth 2 times daily 60 capsule 5    [DISCONTINUED] bisacodyl (BISAC-EVAC) 10 MG suppository Place 1 suppository (10 mg total) rectally daily as needed 50 suppository 3    [DISCONTINUED] senna-docusate (PERICOLACE) 8.6-50 MG per tablet Take 2 tablets by mouth daily as needed for Constipation 30 tablet 5    etonogestrel (NEXPLANON) 68 MG IMPL Inject 68 mg into the skin once   Placed 10/20/16         Allergies:     Allergies   Allergen Reactions    Nitrofurantoin Nausea And Vomiting    Vancomycin Hives     hives 2006  but tolerated Rx in 2010  pt states she had vancomycin in 04/2012.  Feels this is not true allergy  as she has received it recently - had no reaction     Heparin Other (See Comments)     Thrombocytopenia;        Review of Systems:     As per HPI. Otherwise, 10 point ROS unremarkable.    Physical Exam:     Vitals:    08/30/18 1535   BP: 110/80   Pulse: 104   Temp: 36.4 C (97.5 F)   Weight: 76.2 kg (168 lb)   Height: 1.651 m (5\' 5" )     Vitals:    08/30/18 1535   BP: 110/80   Pulse: 104   Temp: 36.4 C (97.5 F)   TempSrc: Temporal   Weight: 76.2 kg (168 lb)   Height: 1.651 m (5\' 5" )     Wt Readings from Last 3 Encounters:   08/30/18 76.2 kg (168 lb)   07/26/18 76.2 kg (168 lb)   06/22/18 76.2 kg (168 lb)     BP Readings from Last 3 Encounters:   08/30/18 110/80   08/11/18 149/88   07/26/18 117/69       Physical Exam:    General: patient sitting in wheelchair with her legs extended. Her legs are shaking from her muscle spasms.  HEENT: Sclerae anicteric. EOMI  Lungs: clear to auscultation bilaterally  Heart: rrr, no m/r/g  Abdomen: Non-distended. Normoactive bowel sounds. Soft, non-tender.  Extremities: No bilateral lower extremity edema, no rashes visualized.  Neurologic: Fully oriented and cooperative with exam. Able to provide an accurate history.     Assessment and Plan:     Cindy Ochoa is a 32 y.o. female, with a PMHx of autonomic dysfunction, depression, frequent UTIs, muscle spasticity, neurogenic bladder, and quadriplegia at C6, presents today for follow-up.     Nephrolithiasis  Patient has a history of L renal calculus s/p ESWL in 2016. Patient has a radiology appointment in December but patient wondering if could assess for nephrolithiasis sooner. Will have patient do an ultrasound of her kidneys.   -     US abdominal complete; Future    Constipation, chronic  Patient has chronic constipation. Her last bowel movement was a week ago. She recently has not had any medication available except her suppository.  Will refill her medications for her.   -     bisacodyl (BISAC-EVAC) 10 MG suppository; Place 1 suppository (10 mg total) rectally daily as needed  -     polyethylene glycol (GLYCOLAX) powder; Take 17  g by mouth daily   Mix in 8 oz water or juice and drink.  -     senna-docusate (PERICOLACE) 8.6-50 MG per tablet; Take 2 tablets by mouth daily as needed for Constipation  -     docusate sodium (COLACE) 100 MG capsule; Take 1 capsule (100 mg total) by mouth 2 times daily    Depression, unspecified depression type  Patient has depression. She says her mood has fluctuated and is currently coping with the loss of her son's grandmother, with whom she was close. She is interested in a refill of her mirtazapine - has not had the medication in the last month.  -     mirtazapine (REMERON) 7.5 MG tablet; Take 1 tablet (7.5 mg total) by mouth nightly    Urinary tract infection  Patient has a history of multiple UTIs. Will refer patient to infectious disease for guidance on how to best assess patient for possible UTI. Patient's ability to assess from her symptoms is limited to fevers (which she has not had) or chills (though patient also baseline has muscle spasms that can make that assessment difficult). Will appreciate infectious disease's guidance on determining when it may be indicated to treat for UTI given patient will likely always have colonization and cannot assess her own symptoms.   -     AMB REFERRAL TO INFECTIOUS DISEASE    Services   Patient has a shallow pressure ulcer on R butt. he was told that it is not severe enough for wound care clinic. She is wondering if there could be someone who comes to the home for a wound care evaluation.   - will arrange for a home wound care evaluation for her    Health Maintenance:  - Pap Smear - due 09/2020  - Immunizations - UTD  - Hgb A1c - none    Follow up in 3 months, or sooner if needed.    Ezequiel Ganser, MD  PGY-1 Internal Medicine  08/30/2018 11:03 PM

## 2018-08-30 NOTE — Patient Instructions (Signed)
1. I have refilled your constipation and mood medications  2. I have sent a referral to infectious disease. You should expect a call.   3. I have put in an order for an ultrasound for your kidneys.

## 2018-09-01 ENCOUNTER — Telehealth: Payer: Self-pay | Admitting: Internal Medicine

## 2018-09-01 NOTE — Telephone Encounter (Signed)
Call to [facility] to give appointment scheduled for an Ultrasound of the Abdomen as requested at last office visit.     Cindy Ochoa is scheduled on Wednesday, September 18th at 1:00pm .    Prep for this exam :NPO 6 - 8 hours     This test is located at [address] [place] [address] .     Should patient need to reschedule, they can call [phone].    Call Outcome: LVM. Sent Letter.

## 2018-09-03 LAB — AEROBIC CULTURE

## 2018-09-05 ENCOUNTER — Telehealth: Payer: Self-pay | Admitting: Urology

## 2018-09-05 NOTE — Telephone Encounter (Signed)
Spoke with pt and let her know

## 2018-09-05 NOTE — Telephone Encounter (Signed)
FYI- spoke with pt, she denies any symptoms at this time. She dropped off sample because she had finished up a course of antibiotics and wanted to make sure her infection cleared up. Denies fever and chills and all UTI like symptoms.

## 2018-09-05 NOTE — Telephone Encounter (Signed)
Nursing, can you please see if patient is symptomatic for UTI? CUlture was submitted last week. Thanks!

## 2018-09-05 NOTE — Telephone Encounter (Signed)
Okay, thanks. Culture still shows bacteria but given that this patient has a Urostomy we should interpret this with caution. We do not need to treat at this time. Please advise her that if she becomes symptomatic again she should be evaluated in clinic given her history. Thanks!

## 2018-09-06 ENCOUNTER — Telehealth: Payer: Self-pay | Admitting: Internal Medicine

## 2018-09-06 ENCOUNTER — Telehealth: Payer: Self-pay

## 2018-09-06 NOTE — Telephone Encounter (Signed)
Writer received call that patient's mother was in [place] office requesting assistance for patient's copays, she was not aware that patient would have copays and does not have money on her. SW placing voucher for medications.      [facility] SOCIAL WORK  PHARMACY FORM     Todays date:  September 06, 2018    Patient Name: Cindy Ochoa      Medical Record #: [id number]   DOB: 24-Jun-1986  Patients Address: [place]                Social Worker: Levell July, LMSW       Date of Service: September 06, 2018       Funding Source: [place]  ___________________________________________________________________    Pharmacy Information:  Date/time sent: September 06, 2018     Time needed: ASAP    Patient Location: Outpatient    Medication Pick-up Preference: Patient will pick up at the pharmacy    Pharmacy Contact: [place] work signature: Levell July, [place] (if indicated):  Date:  (supervisor signature not required for Medicaid pending)      Levell July, [place] Work  [facility] Internal Medicine  Pager [phone]

## 2018-09-06 NOTE — Telephone Encounter (Signed)
Spoke to [place] with [facility]:  Made referral for wound evauluation and treatment.       Per Dr. Diana Eves note on 08/30/18    Patient has a shallow pressure ulcer on R butt. he was told that it is not severe enough for wound care clinic. She is wondering if there could be someone who comes to the home for a wound care evaluation.   - will arrange for a home wound care evaluation for her

## 2018-09-06 NOTE — Telephone Encounter (Signed)
Cindy Ochoa with [facility] called states patient informed her she would like home care services to assist her with redness and pain on her bottom.    Caller states patient would like home care services so the wound wont turn into anything major.    Contact patient to advise of referral [phone]    Caller can be reached at [phone]

## 2018-09-08 NOTE — Telephone Encounter (Signed)
Patient is calling to speak to the Doctor in regards to the wound care evaluation     Patient is looking for clarification on when the visit will take place     Patient can be reached at [phone]

## 2018-09-08 NOTE — Telephone Encounter (Signed)
Attempted to contact pt to provide her [place] phone number, [phone]. Pt can call this number and ask when a visit is scheduled.  No answer or identified VM. Generic message left. Call back number provided.

## 2018-09-09 ENCOUNTER — Telehealth: Payer: Self-pay | Admitting: Internal Medicine

## 2018-09-09 ENCOUNTER — Other Ambulatory Visit: Payer: Self-pay | Admitting: Student in an Organized Health Care Education/Training Program

## 2018-09-09 MED ORDER — GENERIC DME *A*
0 refills | Status: DC
Start: 2018-09-09 — End: 2018-09-12

## 2018-09-09 NOTE — Telephone Encounter (Signed)
Script faxed to [facility] today.

## 2018-09-09 NOTE — Telephone Encounter (Signed)
Cindy Ochoa calling to request prescription(s) new hoyer lift to be sent to the following Pharmacy [facility].    Is patient out of the medication? yes  Patient states her hoyer lift broke last week needs new lift, unable to repair current hoyer    Patient can be reached if necessary at [phone].

## 2018-09-09 NOTE — Telephone Encounter (Signed)
MD not in clinic, will route to Team provider for assistance.

## 2018-09-09 NOTE — Telephone Encounter (Signed)
Spoke with pt who states that she has two new aides who are new to the medical field and really can't assess her old wound on her bottom.  Advised her to call [facility] home [facility] as a referral was made for a wound nurse.  Provided her phone number, [phone]) [phone].  She verbalized understanding and is going to call them.

## 2018-09-09 NOTE — Telephone Encounter (Signed)
Cindy Ochoa is returning a call from [employer]. She is requesting a call back after noon today as she'll be busy until then.    She can be reached back at [phone].

## 2018-09-12 ENCOUNTER — Other Ambulatory Visit: Payer: Self-pay | Admitting: Student in an Organized Health Care Education/Training Program

## 2018-09-12 MED ORDER — GENERIC DME *A*
0 refills | Status: AC
Start: 2018-09-12 — End: ?

## 2018-09-14 ENCOUNTER — Ambulatory Visit
Admission: RE | Admit: 2018-09-14 | Discharge: 2018-09-14 | Disposition: A | Discharge status: Home / Self Care | Payer: MEDICAID | Source: Ambulatory Visit | Attending: Student in an Organized Health Care Education/Training Program | Admitting: Student in an Organized Health Care Education/Training Program

## 2018-09-14 DIAGNOSIS — Z87442 Personal history of urinary calculi: Secondary | ICD-10-CM

## 2018-09-14 DIAGNOSIS — N200 Calculus of kidney: Secondary | ICD-10-CM

## 2018-09-14 DIAGNOSIS — N319 Neuromuscular dysfunction of bladder, unspecified: Secondary | ICD-10-CM

## 2018-09-15 ENCOUNTER — Ambulatory Visit: Payer: MEDICAID | Attending: Infectious Diseases | Admitting: Infectious Diseases

## 2018-09-15 ENCOUNTER — Ambulatory Visit: Payer: MEDICAID | Admitting: Infectious Diseases

## 2018-09-15 DIAGNOSIS — Z936 Other artificial openings of urinary tract status: Secondary | ICD-10-CM

## 2018-09-15 DIAGNOSIS — N319 Neuromuscular dysfunction of bladder, unspecified: Secondary | ICD-10-CM

## 2018-09-15 DIAGNOSIS — G8253 Quadriplegia, C5-C7 complete: Secondary | ICD-10-CM

## 2018-09-15 DIAGNOSIS — N390 Urinary tract infection, site not specified: Secondary | ICD-10-CM
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
## 2018-09-16 ENCOUNTER — Telehealth: Payer: Self-pay | Admitting: Internal Medicine

## 2018-09-16 ENCOUNTER — Other Ambulatory Visit: Payer: Self-pay | Admitting: Internal Medicine

## 2018-09-16 NOTE — Telephone Encounter (Signed)
Cindy Ochoa with [facility] is wondering if our office has any information regarding who currently looks after patients wound care.    Cindy Ochoa can be reached at [phone].

## 2018-09-16 NOTE — Telephone Encounter (Signed)
Spoke to [place] with [facility].     Informed [name] that nursing can see patient as much as needed/appropriate.  Lori verbalized understanding. [name] reports nursing should need to see patient 4 more times.

## 2018-09-16 NOTE — Telephone Encounter (Signed)
Routing to nursing

## 2018-09-16 NOTE — Telephone Encounter (Signed)
[name] from [facility] called stating she's completing patient's start of care for wound care today. Seh states there are 2 wounds, one is stage 1, the other stage 2.   She is wondering why she was only requested 1 visit or if she can get a new order to see patient twice a week for the next 2 weeks if possible.     She can be reached back at [phone] to advise.

## 2018-09-16 NOTE — Progress Notes (Signed)
Pt agreed to tdap vaccine. appt on 9.23    Immunizations:  Medicare?  no      Tdap:                               Medicare to local pharmacy                               If > 33 yo   Overdue: Yes        Agrees to schedule:  yes    *If none Tdap x 1       Order code: [id number]   If Tdap, then Td every 10 years     Order code: [id number]      Information:  For Tdap and Zostavax with Medicare:  Medicare and/or dual eligible patients should be directed to their pharmacy to receive these vaccines (a copay may apply - though usually minimal).    Local pharmacies that have all vaccinations available include:   o [facility], [facility], [facility], [place], [place] (dependent upon location)

## 2018-09-16 NOTE — Telephone Encounter (Signed)
Informed Lori with [facility] of note from Peri Jefferson, RN:    Cindy Ochoa requesting info for the patient's aide service   Patient has [employer] aide service 16 hours a day -she reports she calls herself to get arranged  Cindy Ochoa verbalized understading

## 2018-09-19 ENCOUNTER — Encounter: Payer: Self-pay | Admitting: Internal Medicine

## 2018-09-19 ENCOUNTER — Other Ambulatory Visit
Admission: RE | Admit: 2018-09-19 | Discharge: 2018-09-19 | Disposition: A | Discharge status: Home / Self Care | Payer: MEDICAID | Source: Ambulatory Visit | Attending: Infectious Diseases | Admitting: Infectious Diseases

## 2018-09-19 ENCOUNTER — Ambulatory Visit: Payer: MEDICAID | Attending: Internal Medicine | Admitting: Internal Medicine

## 2018-09-19 VITALS — BP 140/90 | HR 100 | Temp 96.8°F | Ht 65.0 in | Wt 167.0 lb

## 2018-09-19 DIAGNOSIS — Z0000 Encounter for general adult medical examination without abnormal findings: Secondary | ICD-10-CM | POA: Insufficient documentation

## 2018-09-19 DIAGNOSIS — N390 Urinary tract infection, site not specified: Secondary | ICD-10-CM | POA: Insufficient documentation

## 2018-09-19 DIAGNOSIS — Z23 Encounter for immunization: Secondary | ICD-10-CM | POA: Insufficient documentation

## 2018-09-19 DIAGNOSIS — L0291 Cutaneous abscess, unspecified: Secondary | ICD-10-CM | POA: Insufficient documentation

## 2018-09-19 LAB — URINALYSIS REFLEX TO CULTURE
Ketones, UA: NEGATIVE
Nitrite,UA: NEGATIVE
Protein,UA: 100 mg/dL — AB
Specific Gravity,UA: 1.014 (ref 1.002–1.030)
pH,UA: 6 (ref 5.0–8.0)

## 2018-09-19 LAB — URINE MICROSCOPIC (IQ200)
RBC,UA: 4 /hpf — AB (ref 0–2)
WBC,UA: 34 /hpf — ABNORMAL HIGH (ref 0–5)

## 2018-09-19 MED ORDER — CEPHALEXIN 500 MG PO CAPS *I*
500.0000 mg | ORAL_CAPSULE | Freq: Two times a day (BID) | ORAL | 0 refills | Status: DC
Start: 2018-09-19 — End: 2018-11-01

## 2018-09-19 NOTE — Patient Instructions (Addendum)
Physical Medicine  PCP  Urology/ID  GYN  Wound care as needed     Check to make sure get aid service.

## 2018-09-20 ENCOUNTER — Telehealth: Payer: Self-pay

## 2018-09-20 NOTE — Telephone Encounter (Signed)
-----   Message from Marilynn Latino, NP sent at 09/20/2018  9:15 AM EDT -----  Nursing, can you please call the lab and inform then that this was not a voided specimen, it was from a Urostomy. Hopefully they can rerun it for sensitivities. Thanks!

## 2018-09-22 NOTE — Progress Notes (Addendum)
Reason For Visit: The primary encounter diagnosis was Healthcare maintenance. Diagnoses of Need for immunization against influenza and Need for vaccination were also pertinent to this visit.      HPI:  Cindy Ochoa is 32 y.o. year old female with Autonomic dysfunction, constipation, depression, frequent UTIs with urostomy, quadriplegic C6, thrombocytopenia, previous trach    Currently every day smoker not ready to quit    Patient will like to move to [place] with her son  Currently he has [place] and her mother helps out at times    She was given information by Education officer, museum at her last visit  For [place] in     Patient is having lump in right axilla  She denies any fever or chills  Currently no drainage    Social :Mother and son are here with her today    Event organiser Liam Rogers is in charge or her trust/settlement and they may need some medical information from [place] prior to leaving for [place].  She signed a release     Medications:     Current Outpatient Prescriptions   Medication Sig    cephalexin (KEFLEX) 500 MG capsule Take 1 capsule (500 mg total) by mouth 2 times daily    generic DME Repairs to hospital bed/hoyer lift  ICD 10: G82.54   Ht: 1.35m Wt: 76.2 kg    bisacodyl (BISAC-EVAC) 10 MG suppository Place 1 suppository (10 mg total) rectally daily as needed    polyethylene glycol (GLYCOLAX) powder Take 17 g by mouth daily   Mix in 8 oz water or juice and drink.    senna-docusate (PERICOLACE) 8.6-50 MG per tablet Take 2 tablets by mouth daily as needed for Constipation    docusate sodium (COLACE) 100 MG capsule Take 1 capsule (100 mg total) by mouth 2 times daily    mirtazapine (REMERON) 7.5 MG tablet Take 1 tablet (7.5 mg total) by mouth nightly    Non-System Medication Urostomy drainage bags 2000 cc change as needed. Dx N39.46 and  G82.54    Ostomy Supplies [facility] Coloplast Urostomy two piece bag and wafer 1 1/4 "H  7/8 "V N31.9 Neuromuscular dysfunction of the  bladder. Use as directed.    disposable underpads 30"x36" (CHUX) Use 6 times daily and PRN. 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 Dx N39.46 and  G82.54    Ostomy Supplies MISC Coloplast Urostomy two piece bag and wafer 1 1/4 "H  7/8 "V N31.9 Neuromuscular dysfunction of the bladder    Ostomy Supplies Pouch MISC Coloplast Urostomy two piece bag and wafer 1 1/4 "H  7/8 "V N31.9 Neuromuscular dysfunction of the bladder Use as directed    Ostomy Supplies MISC Barrier ring. Use as directed.    incontinence supply disposable Large pull ups - use up to 5 x daily  Dx N39.46    disposable gloves 1 box Dynarex PF Vinyl Gloves    Gauze Pads & Dressings (ABDOMINAL PAD) 8"X10" PADS By 1 each no specified route daily   Cover buttock wounds 2x daily    Adhesive Tape (MEDIPORE SURGICAL 2"X10YD) TAPE Secure the 2 buttocks  wound dressings 2x day    Wound Dressings (HYDROFERA BLUE 4"X4") PADS Cut and moisten with saline as directed and pack in to buttock wound daily    Non-System Medication Gel overlay mattress for  hospital bed - diagnosis G82.54, L89.93, L89.159    acetaminophen (MAPAP) 500 mg tablet Take 1 tablet (500 mg total) by mouth every 4-6 hours as needed   for pain    albuterol HFA 108 (90 Base) MCG/ACT inhaler Inhale 1-2 puffs into the lungs every 6 hours as needed   Shake well before each use.    Non-System Medication Easy Tip Leg Bags 1000mg  - diagnosis G82.53 N39.46    etonogestrel (NEXPLANON) 68 MG IMPL Inject 68 mg into the skin once   Placed 10/20/16    generic DME Use as directed. Hospital bed with Low air loss mattress. Duration of use: 1 year.    Non-System Medication Hospital Bed with Air Mattress - Diagnosis G82.54, L89.93,L89.159  Medicaid [id number]     No current facility-administered medications for this visit.        Medication list reconciled this  visit    Allergies:     Allergies   Allergen Reactions    Nitrofurantoin Nausea And Vomiting    Vancomycin Hives     hives 2006 but tolerated Rx in 2010  pt states she had vancomycin in 04/2012.  Feels this is not true allergy  as she has received it recently - had no reaction     Heparin Other (See Comments)     Thrombocytopenia;        Social history      Social History   Substance Use Topics    Smoking status: Current Some Day Smoker     Packs/day: 0.25     Years: 3.00     Types: Cigarettes     Start date: 12/09/2011     Last attempt to quit: 07/30/2016    Smokeless tobacco: Former Systems developer     Quit date: 04/29/2005      Comment: ~4 cigs a day    Alcohol use No       Review of Systems     ROS  12 point review of system was negative except those mentioned in HPI    Physical Exam:     Physical Exam   Constitutional: She is oriented to person, place, and time. She appears well-developed.   Patient motorized wheelchair  She does have spasms during the visit   Cardiovascular: Normal rate, regular rhythm and normal heart sounds.    No edema    Pulmonary/Chest: Effort normal and breath sounds normal. No respiratory distress. She has no wheezes. She has no rales.   Abdominal: Soft. Bowel sounds are normal. There is no tenderness.   Neurological: She is alert and oriented to person, place, and time.   Skin:             Golf ball size probable cyst in the right axilla  Patient has had cysts in the past and treated with antibiotics and warm soaks    Vitals:    09/19/18 1357   BP: 140/90   BP Location: Right arm   Patient Position: Sitting   Cuff Size: adult   Pulse: 100   Temp: 36 C (96.8 F)   TempSrc: Temporal   Weight: 75.8 kg (167 lb)   Height: 1.651 m (5\' 5" )     Wt Readings from Last 3 Encounters:   09/19/18 75.8 kg (167 lb)   08/30/18 76.2 kg (168 lb)   07/26/18 76.2 kg (168 lb)     BP Readings from Last 3 Encounters:   09/19/18 140/90   08/30/18 110/80   08/11/18 149/88  ASSESSMENT/PLAN:     Diagnoses and all  orders for this visit:      Abscess right axilla  -     cephalexin (KEFLEX) 500 MG capsule; Take 1 capsule (500 mg total) by mouth 2 times daily  Warm compresses  If no improvement Lungs need I&D    Healthcare maintenance  -     Tdap >/= 19yr(Boostrix)    Need for immunization against influenza  -     Flu vaccine quadrivalent greater than or equal to 41mo preservative free IM        Travel to [place] she will need to be connected with specialists  She does not plan to go until the spring after son gets out of school    6 weeks with CCP    Patient instructions      Patient Instructions   Physical Medicine  PCP  Urology/ID  GYN  Wound care as needed     Check to make sure get aid service.               Lavone Orn,  Nurse Practitioner  [facility]

## 2018-09-23 ENCOUNTER — Telehealth: Payer: Self-pay | Admitting: Internal Medicine

## 2018-09-23 LAB — AEROBIC CULTURE

## 2018-09-23 NOTE — Telephone Encounter (Signed)
Message noted. Routing to provider as Juluis Rainier.    Attempted to contact patient.  Left message with call back number provided.    Attempted to contact [name] with [facility]:  Left message with call back number provided.

## 2018-09-23 NOTE — Telephone Encounter (Signed)
[name], with [facility] is calling to report the patient is being discharged from the services today    [name] stated the agency has not been able to reach her     [name] is looking for clarification on if the patient is in the hospital     [name] can be reached at [phone]

## 2018-09-24 LAB — AEROBIC CULTURE

## 2018-09-26 ENCOUNTER — Encounter: Payer: Self-pay | Admitting: Physical Medicine and Rehabilitation

## 2018-09-26 ENCOUNTER — Ambulatory Visit: Payer: MEDICAID | Attending: Physical Medicine and Rehabilitation | Admitting: Physical Medicine and Rehabilitation

## 2018-09-26 VITALS — BP 90/51 | HR 105 | Temp 97.5°F

## 2018-09-26 DIAGNOSIS — G8253 Quadriplegia, C5-C7 complete: Secondary | ICD-10-CM | POA: Insufficient documentation

## 2018-09-26 MED ORDER — ONABOTULINUMTOXINA 100 UNIT IJ SOLR *I*
600.0000 [IU] | Freq: Once | INTRAMUSCULAR | Status: AC
Start: 2018-09-26 — End: 2018-09-26
  Administered 2018-09-26: 600 [IU] via INTRAMUSCULAR

## 2018-09-26 NOTE — Progress Notes (Signed)
Botulinum Toxin Injection Procedure Note       Pre-operative Diagnosis: C6 complete tetraplegia      Indications: Spasticity that adversely impacts function       Procedure Details   The risks, benefits, indications, potential complications, and alternatives were explained to the patient and/or guardian who verbalized understanding and informed consent obtained.     The area for injection was identified and a time out called to re-identify.      The correct patient was identified with 2 identifiers The correct procedure, location site(s) and laterality were identified with the patient and/or guardian and correspond to the consent form  The appropriate site was marked and verified.  The patient was placed in the correct position.    After prepping the skin with alcohol overlying the following muscles, botlinum toxin was injected intramuscularly.       R quadriceps (divided into 4 sites): 300 units  L quadriceps (divided into 4 sites): 300 units    Botulinum toxin Lot #: [id number]  Botulinum toxin expiration date: 09/2020  Total botox units injected: 600 units   Total botox units wasted: 0 units       The patient tolerated the procedure without complications.         Cindy Hack, MD

## 2018-09-28 ENCOUNTER — Observation Stay
Admission: EM | Admit: 2018-09-28 | Discharge: 2018-09-29 | Disposition: A | Discharge status: Home Health | Payer: MEDICAID | Source: Ambulatory Visit | Attending: Internal Medicine | Admitting: Internal Medicine

## 2018-09-28 ENCOUNTER — Encounter: Payer: Self-pay | Admitting: Student in an Organized Health Care Education/Training Program

## 2018-09-28 ENCOUNTER — Encounter: Payer: Self-pay | Admitting: Internal Medicine

## 2018-09-28 ENCOUNTER — Ambulatory Visit: Payer: MEDICAID | Admitting: Internal Medicine

## 2018-09-28 ENCOUNTER — Telehealth: Payer: Self-pay | Admitting: Internal Medicine

## 2018-09-28 VITALS — BP 88/72 | HR 104 | Temp 95.7°F | Ht 65.0 in | Wt 165.0 lb

## 2018-09-28 DIAGNOSIS — M62838 Other muscle spasm: Secondary | ICD-10-CM | POA: Insufficient documentation

## 2018-09-28 DIAGNOSIS — A419 Sepsis, unspecified organism: Principal | ICD-10-CM | POA: Insufficient documentation

## 2018-09-28 DIAGNOSIS — G909 Disorder of the autonomic nervous system, unspecified: Secondary | ICD-10-CM

## 2018-09-28 DIAGNOSIS — G8250 Quadriplegia, unspecified: Secondary | ICD-10-CM | POA: Insufficient documentation

## 2018-09-28 DIAGNOSIS — N319 Neuromuscular dysfunction of bladder, unspecified: Secondary | ICD-10-CM | POA: Insufficient documentation

## 2018-09-28 DIAGNOSIS — Z87440 Personal history of urinary (tract) infections: Secondary | ICD-10-CM | POA: Insufficient documentation

## 2018-09-28 DIAGNOSIS — X58XXXD Exposure to other specified factors, subsequent encounter: Secondary | ICD-10-CM | POA: Diagnosis present

## 2018-09-28 DIAGNOSIS — R509 Fever, unspecified: Secondary | ICD-10-CM

## 2018-09-28 DIAGNOSIS — L89159 Pressure ulcer of sacral region, unspecified stage: Secondary | ICD-10-CM | POA: Insufficient documentation

## 2018-09-28 DIAGNOSIS — L02411 Cutaneous abscess of right axilla: Secondary | ICD-10-CM | POA: Insufficient documentation

## 2018-09-28 DIAGNOSIS — E860 Dehydration: Secondary | ICD-10-CM | POA: Insufficient documentation

## 2018-09-28 DIAGNOSIS — F329 Major depressive disorder, single episode, unspecified: Secondary | ICD-10-CM | POA: Insufficient documentation

## 2018-09-28 DIAGNOSIS — L0291 Cutaneous abscess, unspecified: Secondary | ICD-10-CM

## 2018-09-28 DIAGNOSIS — F17210 Nicotine dependence, cigarettes, uncomplicated: Secondary | ICD-10-CM | POA: Insufficient documentation

## 2018-09-28 DIAGNOSIS — R000 Tachycardia, unspecified: Secondary | ICD-10-CM

## 2018-09-28 DIAGNOSIS — S14106D Unspecified injury at C6 level of cervical spinal cord, subsequent encounter: Secondary | ICD-10-CM

## 2018-09-28 DIAGNOSIS — Y9289 Other specified places as the place of occurrence of the external cause: Secondary | ICD-10-CM | POA: Diagnosis present

## 2018-09-28 DIAGNOSIS — L02419 Cutaneous abscess of limb, unspecified: Secondary | ICD-10-CM | POA: Insufficient documentation

## 2018-09-28 DIAGNOSIS — Z936 Other artificial openings of urinary tract status: Secondary | ICD-10-CM | POA: Insufficient documentation

## 2018-09-28 DIAGNOSIS — G8254 Quadriplegia, C5-C7 incomplete: Secondary | ICD-10-CM

## 2018-09-28 LAB — URINALYSIS WITH MICROSCOPIC
Ketones, UA: NEGATIVE
Nitrite,UA: NEGATIVE
Protein,UA: 30 mg/dL — AB
RBC,UA: 3 /hpf — AB (ref 0–2)
Specific Gravity,UA: 1.012 (ref 1.002–1.030)
WBC,UA: 20 /hpf — ABNORMAL HIGH (ref 0–5)
pH,UA: 6 (ref 5.0–8.0)

## 2018-09-28 LAB — LACTATE, PLASMA: Lactate: 2.8 mmol/L — ABNORMAL HIGH (ref 0.5–2.2)

## 2018-09-28 LAB — CBC AND DIFFERENTIAL
Baso # K/uL: 0 10*3/uL (ref 0.0–0.1)
Basophil %: 0 %
Eos # K/uL: 0 10*3/uL (ref 0.0–0.4)
Eosinophil %: 0 %
Hematocrit: 44 % (ref 34–45)
Hemoglobin: 14.2 g/dL (ref 11.2–15.7)
Lymph # K/uL: 5.3 10*3/uL — ABNORMAL HIGH (ref 1.2–3.7)
Lymphocyte %: 40.9 %
MCH: 30 pg/cell (ref 26–32)
MCHC: 32 g/dL (ref 32–36)
MCV: 94 fL (ref 79–95)
Mono # K/uL: 0.6 10*3/uL (ref 0.2–0.9)
Monocyte %: 4.3 %
Neut # K/uL: 7.1 10*3/uL — ABNORMAL HIGH (ref 1.6–6.1)
Nucl RBC # K/uL: 0 10*3/uL (ref 0.0–0.0)
Nucl RBC %: 0.1 /100 WBC (ref 0.0–0.2)
Platelets: 122 10*3/uL — ABNORMAL LOW (ref 160–370)
RBC: 4.7 MIL/uL (ref 3.9–5.2)
RDW: 14.7 % — ABNORMAL HIGH (ref 11.7–14.4)
Seg Neut %: 54.8 %
WBC: 12.9 10*3/uL — ABNORMAL HIGH (ref 4.0–10.0)

## 2018-09-28 LAB — PLASMA PROF 7 (ED ONLY)
Anion Gap,PL: 17 — ABNORMAL HIGH (ref 7–16)
CO2,Plasma: 21 mmol/L (ref 20–28)
Chloride,Plasma: 103 mmol/L (ref 96–108)
Creatinine: 0.45 mg/dL — ABNORMAL LOW (ref 0.51–0.95)
GFR,Black: 152 *
GFR,Caucasian: 132 *
Glucose,Plasma: 112 mg/dL — ABNORMAL HIGH (ref 60–99)
Potassium,Plasma: 4 mmol/L (ref 3.4–4.7)
Sodium,Plasma: 141 mmol/L (ref 133–145)
UN,Plasma: 9 mg/dL (ref 6–20)

## 2018-09-28 LAB — DIFF MANUAL: Diff Based On: 115 CELLS

## 2018-09-28 LAB — HOLD SST

## 2018-09-28 MED ORDER — DOCUSATE SODIUM 100 MG PO CAPS *I*
100.0000 mg | ORAL_CAPSULE | Freq: Two times a day (BID) | ORAL | Status: DC
Start: 2018-09-28 — End: 2018-09-29
  Administered 2018-09-28: 100 mg via ORAL
  Filled 2018-09-28 (×2): qty 1

## 2018-09-28 MED ORDER — SODIUM CHLORIDE 0.9 % FLUSH FOR PUMPS *I*
0.0000 mL/h | INTRAVENOUS | Status: DC | PRN
Start: 2018-09-28 — End: 2018-09-29

## 2018-09-28 MED ORDER — POLYETHYLENE GLYCOL 3350 PO PACK 17 GM *I*
17.0000 g | PACK | Freq: Every day | ORAL | Status: DC
Start: 2018-09-28 — End: 2018-09-29
  Administered 2018-09-28: 17 g via ORAL
  Filled 2018-09-28 (×2): qty 17

## 2018-09-28 MED ORDER — SODIUM CHLORIDE 0.9 % IV BOLUS *I*
1000.0000 mL | Freq: Once | Status: AC
Start: 2018-09-28 — End: 2018-09-28
  Administered 2018-09-28: 1000 mL via INTRAVENOUS

## 2018-09-28 MED ORDER — ACETAMINOPHEN 325 MG PO TABS *I*
650.0000 mg | ORAL_TABLET | ORAL | Status: DC | PRN
Start: 2018-09-28 — End: 2018-09-29

## 2018-09-28 MED ORDER — BISACODYL 10 MG RE SUPP *I*
10.0000 mg | Freq: Every day | RECTAL | Status: DC | PRN
Start: 2018-09-28 — End: 2018-09-29

## 2018-09-28 MED ORDER — DEXTROSE 5 % FLUSH FOR PUMPS *I*
0.0000 mL/h | INTRAVENOUS | Status: DC | PRN
Start: 2018-09-28 — End: 2018-09-29

## 2018-09-28 MED ORDER — VANCOMYCIN HCL IN NACL 1500 MG/275 ML IV SOLN *I*
1500.0000 mg | Freq: Once | INTRAVENOUS | Status: AC
Start: 2018-09-28 — End: 2018-09-28
  Administered 2018-09-28: 1500 mg via INTRAVENOUS
  Filled 2018-09-28: qty 275

## 2018-09-28 MED ORDER — LIDOCAINE-EPINEPHRINE 1 %-1:100000 IJ SOLN *I*
10.0000 mL | Freq: Once | INTRAMUSCULAR | Status: DC
Start: 2018-09-28 — End: 2018-09-29
  Filled 2018-09-28: qty 20

## 2018-09-28 MED ORDER — SODIUM CHLORIDE 0.9 % IV BOLUS *I*
1000.0000 mL | Freq: Once | Status: AC
Start: 2018-09-28 — End: 2018-09-29
  Administered 2018-09-28: 1000 mL via INTRAVENOUS

## 2018-09-28 MED ORDER — VANCOMYCIN HCL IN NACL 1000 MG/200 ML IV SOLN *I*
1000.0000 mg | Freq: Three times a day (TID) | INTRAVENOUS | Status: DC
Start: 2018-09-29 — End: 2018-09-29
  Administered 2018-09-29 (×3): 1000 mg via INTRAVENOUS
  Filled 2018-09-28 (×2): qty 200

## 2018-09-28 NOTE — Telephone Encounter (Signed)
Patient states she was prescribed an antibiotic on 9/23 for a lump under her arm.  She states she took all of it and it didn't help, she now has a fever of 101.2.    Patient would like a call back at [phone] (H
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
## 2018-09-28 NOTE — Progress Notes (Signed)
Reason For Visit: There were no encounter diagnoses.      HPI:  Cindy Ochoa is 32 y.o. year old female with paraplegia, autonomic dysfunction, depression, recurrent UTIs, muscle spasticity, neurogenic bladder, neurogenic bowel.    Patient is here today for urgent visit    Patient had an abscess in her right axilla approximately a week ago she was started on Keflex 1 tablet twice a day.  Patient reports abscess has gotten worse she had a fever of 101 this morning    Due to autonomic dysfunction patient always has shaking and sweating.    Pressures trending lower today than normal    Patient motorized wheelchair    Medications:     Current Outpatient Prescriptions   Medication Sig    cephalexin (KEFLEX) 500 MG capsule Take 1 capsule (500 mg total) by mouth 2 times daily    generic DME Repairs to hospital bed/hoyer lift  ICD 10: G82.54   Ht: 1.105m Wt: 76.2 kg    bisacodyl (BISAC-EVAC) 10 MG suppository Place 1 suppository (10 mg total) rectally daily as needed    polyethylene glycol (GLYCOLAX) powder Take 17 g by mouth daily   Mix in 8 oz water or juice and drink.    senna-docusate (PERICOLACE) 8.6-50 MG per tablet Take 2 tablets by mouth daily as needed for Constipation    docusate sodium (COLACE) 100 MG capsule Take 1 capsule (100 mg total) by mouth 2 times daily    mirtazapine (REMERON) 7.5 MG tablet Take 1 tablet (7.5 mg total) by mouth nightly    Non-System Medication Urostomy drainage bags 2000 cc change as needed. Dx N39.46 and  G82.54    Ostomy Supplies [facility] Coloplast Urostomy two piece bag and wafer 1 1/4 "H  7/8 "V N31.9 Neuromuscular dysfunction of the bladder. Use as directed.    disposable underpads 30"x36" (CHUX) Use 6 times daily and PRN. 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 Dx N39.46 and  G82.54    Ostomy Supplies MISC Coloplast Urostomy two piece  bag and wafer 1 1/4 "H  7/8 "V N31.9 Neuromuscular dysfunction of the bladder    Ostomy Supplies Pouch MISC Coloplast Urostomy two piece bag and wafer 1 1/4 "H  7/8 "V N31.9 Neuromuscular dysfunction of the bladder Use as directed    Ostomy Supplies MISC Barrier ring. Use as directed.    incontinence supply disposable Large pull ups - use up to 5 x daily  Dx N39.46    disposable gloves 1 box Dynarex PF Vinyl Gloves    Gauze Pads & Dressings (ABDOMINAL PAD) 8"X10" PADS By 1 each no specified route daily   Cover buttock wounds 2x daily    Adhesive Tape (MEDIPORE SURGICAL 2"X10YD) TAPE Secure the 2 buttocks  wound dressings 2x day    Wound Dressings (HYDROFERA BLUE 4"X4") PADS Cut and moisten with saline as directed and pack in to buttock wound daily    Non-System Medication Gel overlay mattress for hospital bed - diagnosis G82.54, L89.93, L89.159    acetaminophen (MAPAP) 500 mg tablet Take 1 tablet (500 mg total) by mouth every 4-6 hours as needed   for pain    albuterol HFA 108 (90 Base) MCG/ACT inhaler Inhale 1-2 puffs into the lungs every 6 hours as needed   Shake well before each use.    Non-System Medication Easy  Tip Leg Bags 1000mg  - diagnosis G82.53 N39.46    etonogestrel (NEXPLANON) 68 MG IMPL Inject 68 mg into the skin once   Placed 10/20/16    generic DME Use as directed. Hospital bed with Low air loss mattress. Duration of use: 1 year.    Non-System Medication Hospital Bed with Air Mattress - Diagnosis G82.54, L89.93,L89.159  Medicaid [id number]     No current facility-administered medications for this visit.        Medication list reconciled this visit    Allergies:     Allergies   Allergen Reactions    Nitrofurantoin Nausea And Vomiting    Vancomycin Hives     hives 2006 but tolerated Rx in 2010  pt states she had vancomycin in 04/2012.  Feels this is not true allergy  as she has received it recently - had no reaction     Heparin Other (See Comments)     Thrombocytopenia;        Social  history      Social History   Substance Use Topics    Smoking status: Current Some Day Smoker     Packs/day: 0.25     Years: 3.00     Types: Cigarettes     Start date: 12/09/2011     Last attempt to quit: 07/30/2016    Smokeless tobacco: Former Systems developer     Quit date: 04/29/2005    Alcohol use No       Review of Systems     ROS  12 point review of system was negative except those mentioned in HPI    Physical Exam:     Physical Exam   Right axilla abscess is 2 times larger than last week  There is some redness around the abscess as well as going up her right shoulder  Patient has normal movement of her right arm      Vitals:    09/28/18 1431   BP: (!) 88/72   BP Location: Right arm   Patient Position: Sitting   Pulse: 104   Temp: 35.4 C (95.7 F)   TempSrc: Temporal   Weight: 74.8 kg (165 lb)   Height: 1.651 m (5\' 5" )     Wt Readings from Last 3 Encounters:   09/28/18 74.8 kg (165 lb)   09/19/18 75.8 kg (167 lb)   08/30/18 76.2 kg (168 lb)     BP Readings from Last 3 Encounters:   09/28/18 (!) 88/72   09/26/18 90/51   09/19/18 140/90           ASSESSMENT/PLAN:     Right abscess of the axilla  Concern for sepsis    Report called to the emergency room        Lavone Orn,  Nurse Practitioner  [facility] Internal  Medicine

## 2018-09-28 NOTE — ED Notes (Signed)
Assumed care of pt at this time. Pt resting comfortably in bed in NAD. Will continue to monitor and tx per provider orders.

## 2018-09-28 NOTE — First Provider Contact (Signed)
ED First Provider Contact Note    Initial provider evaluation performed by   ED First Provider Contact     Date/Time Event User Comments    09/28/18 1550 ED First Provider Contact Aarit Kashuba Initial Face to Face Provider Contact        33 y.o. Female with PMH of quadriparesis, decubitus ulcers is here for evaluation of increasing infection. Patient has an abscess at the right axilla. She was seen by her PCP and placed on antibiotics (keflex). Today she has become worse with fever, chills, shaking, sweating. She reports the cyst has increased in size and redness which has spread. Family member also mentions that patient has decubitus ulcers.      Vital signs reviewed. Patient diaphoretic at triage, normal oral temp but low BP and tachycardia noted.    Orders placed:  LABS   Spoke with Pharmacy - ordered Vancomycin 1500 mg IV x 1 (Vancomycin is not a real allergy and patient has had this in the past).  Consider addition of Cefepime if any concern of urinary source is suspected based on prior cultures.    Patient requires further evaluation. BED requested, spoke with Charge Nurse due to concerns with possible sepsis, BED assignment will be placed.     Illene Silver Rinaldo Macqueen, PA, 09/28/2018, 3:50 PM     Quintella Baton, [place]  09/28/18 1614

## 2018-09-28 NOTE — ED Procedure Documentation (Addendum)
Procedures   Incision and drainage  Date/Time: 09/28/2018 10:56 PM  Performed by: Franklyn Lor  Authorized by: Alexis Frock     Consent:     Consent obtained:  Verbal    Consent given by:  Patient    Risks discussed:  Bleeding, incomplete drainage, pain and infection    Alternatives discussed:  No treatment  Universal protocol:     Procedure explained and questions answered to patient or proxy's satisfaction: yes      Imaging studies available: yes      Patient identity confirmed:  Verbally with patient, arm band and hospital-assigned identification number  Location:     Type:  Abscess    Location:  Trunk  Pre-procedure details:     Skin preparation:  Betadine  Anesthesia (see MAR for exact dosages):     Anesthesia method:  Local infiltration    Local anesthetic:  Lidocaine 1% WITH epi  Procedure details:     Needle aspiration: no      Incision types:  Stab incision    Incision depth:  Subcutaneous    Scalpel blade:  11    Wound management:  Probed and deloculated, irrigated with saline and extensive cleaning    Drainage:  Purulent and bloody    Drainage amount:  Copious    Wound treatment:  Wound left open    Packing materials:  None  Post-procedure details:     Patient tolerance of procedure:  Tolerated well, no immediate complications            Simon Rhein Kutsuris, DO     Kutsuris, Simon Rhein, DO  Resident  09/28/18 2259    I was present and participated during the critical and key portions and was immediately available during the remainder of the procedure.    Alexis Frock, MD     Alexis Frock, MD  10/04/18 1252

## 2018-09-28 NOTE — ED Triage Notes (Signed)
Referred from AC-[facility] with concern for sepsis. C/o abscess to right axilla, fever, chills. Diaphoretic at the triage area. Hx.Quadriplegia.       Triage Note   Jola Baptist, RN

## 2018-09-28 NOTE — ED Procedure Documentation (Addendum)
Procedures   Ultrasound - Musculoskeletal/Soft Tissue  Date/Time: 09/28/2018 10:55 PM  Performed by: Franklyn Lor  Authorized by: Angelica Ran       Procedure details:     Indications: suspected skin / soft tissue infection      Assessment for: abscess and cellulitis      Structures Visualized:  :  :  :  :   Trunk: right axilla  :    Exam limitations: none      Findings:     Fluid collection (measured size): visualized      Soft tissue edema (cobblestoning): visualized      Foreign body: not visualized      Impression:        Present: abscess and cellulitis                  Images were interpreted by me and archived to [facility] PACS.            Simon Rhein Kutsuris, DO     Kutsuris, Simon Rhein, DO  Resident  09/28/18 2256      Ultrasound Procedure: I was present and participated during the critical and key components of the procedure, and immediately available during the remainder of the procedure.    Images: Images were reviewed and interpreted by me and I agree with the resident interpretation as documented.    Bertram Savin, MD as of 09/29/2018 at 5:03 PM       Bertram Savin, MD  09/29/18 [phone]
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
## 2018-09-28 NOTE — ED Notes (Signed)
Stage 3 pressure ulcer noted to R ischium and coccyx. Wounds cleansed w/ NS, skin prep and Allevyn in place.

## 2018-09-28 NOTE — ED Notes (Signed)
Report Given To  G.F      Descriptive Sentence / Reason for Admission   Pt presents to ED w/ c/o R axillary abscess w/ redness, fevers and chills.   Hx: Quadriplegic, Neurogenic bladder       Active Issues / Relevant Events   Full code  A&Ox4  W/C bound  Urostomy draining  Stage 3 pressure ulcer to R ischium and coccyx- Allevyn in place    WBC: 12.9; Lactate: 2.8      To Do List  Monitor tele  Meds per MAR  VS Q4  T&P  Wound care      Anticipatory Guidance / Discharge Planning  Admitted- Abscess

## 2018-09-28 NOTE — ED Notes (Signed)
09/28/18 1512   Expected Call-In Information   ED Service [facility] Adult Call-in   PCP/Service Referral [name], NP, [facility]   Call received from [place]? No   Pt Info note/Reason for sending Pt who is a para with autonomic dysfunction, now with right axillary abscess with redness up into her shoulder, concern for sepsis.   Pt Coming from MD Office   Requested Evaluation By Adult ED   Does referring physician have admitting privileges? Yes   Location of PCP have admitting privileges [facility]   Call reported to call in note

## 2018-09-28 NOTE — ED Notes (Signed)
Pt presents to ED w/ c/o R axillary abscess w/ redness, fevers and chills. Denies N/V/D, chest pain, SOB. Hx: Quadriplegic, Neurogenic bladder

## 2018-09-28 NOTE — Telephone Encounter (Signed)
Spoke to patient:    Patient states she had a fever of 101.2 this morning. Patient rechecked it an hour later and it was 99.8.  Patent seen in office on 9/23 for probable cyst in the right axilla.    Patient's aide reports wound in right axilla is still about the size of a golf ball or larger.  Patient informs writer that she took the full course of the Keflex and has been using warm compresses on the site.     Writer scheduled patient an appointment for re-evaluation with Zella Richer, NP   09/28/2018 2:20 PM Bilsback, Marilu Favre, NP [facility]   In the meantime, advised patient to increase fluid intake, take Tylenol as directed and retake her temperature every 2-4 hours. Advised to wear light clothing and to rest.     Patient verbalized understanding. Patient mentions she will arrange her own transportation and will give the office a call if she has trouble.

## 2018-09-29 LAB — CBC AND DIFFERENTIAL
Baso # K/uL: 0 10*3/uL (ref 0.0–0.1)
Basophil %: 0.1 %
Eos # K/uL: 0 10*3/uL (ref 0.0–0.4)
Eosinophil %: 0.5 %
Hematocrit: 35 % (ref 34–45)
Hemoglobin: 11.4 g/dL (ref 11.2–15.7)
IMM Granulocytes #: 0 10*3/uL
IMM Granulocytes: 0.2 %
Lymph # K/uL: 2.8 10*3/uL (ref 1.2–3.7)
Lymphocyte %: 34.2 %
MCH: 30 pg/cell (ref 26–32)
MCHC: 32 g/dL (ref 32–36)
MCV: 94 fL (ref 79–95)
Mono # K/uL: 0.6 10*3/uL (ref 0.2–0.9)
Monocyte %: 8 %
Neut # K/uL: 4.6 10*3/uL (ref 1.6–6.1)
Nucl RBC # K/uL: 0 10*3/uL (ref 0.0–0.0)
Nucl RBC %: 0 /100 WBC (ref 0.0–0.2)
Platelets: 85 10*3/uL — ABNORMAL LOW (ref 160–370)
RBC: 3.8 MIL/uL — ABNORMAL LOW (ref 3.9–5.2)
RDW: 14.7 % — ABNORMAL HIGH (ref 11.7–14.4)
Seg Neut %: 57 %
WBC: 8 10*3/uL (ref 4.0–10.0)

## 2018-09-29 LAB — BASIC METABOLIC PANEL
Anion Gap: 11 (ref 7–16)
CO2: 23 mmol/L (ref 20–28)
Calcium: 8.8 mg/dL (ref 8.8–10.2)
Chloride: 106 mmol/L (ref 96–108)
Creatinine: 0.46 mg/dL — ABNORMAL LOW (ref 0.51–0.95)
GFR,Black: 151 *
GFR,Caucasian: 131 *
Glucose: 106 mg/dL — ABNORMAL HIGH (ref 60–99)
Lab: 10 mg/dL (ref 6–20)
Potassium: 3.4 mmol/L (ref 3.3–5.1)
Sodium: 140 mmol/L (ref 133–145)

## 2018-09-29 LAB — HOLD GRAY

## 2018-09-29 LAB — LACTATE, VENOUS, WHOLE BLOOD: Lactate VEN,WB: 0.9 mmol/L (ref 0.5–2.2)

## 2018-09-29 MED ORDER — SULFAMETHOXAZOLE-TRIMETHOPRIM 400-80 MG PO TABS *I*
1.0000 | ORAL_TABLET | Freq: Two times a day (BID) | ORAL | 0 refills | Status: AC
Start: 2018-09-29 — End: 2018-10-06

## 2018-09-29 NOTE — Progress Notes (Signed)
Discharge planning- progress note    Anticipate discharge later today- to return home with family and home care services   Home care referral: made with [facility] home care, spoke with [name] RN- aware of discharge plan  - Patient from home with family- independent with electronic wheelchair once she is assisted in to  - HHA have been doing wound care to Stage 3 pressure ulcer to R ischium and coccyx  - would like home care visits to assess new under arm abscess area   - follow up: PCP scheduled and added to AVS    Care coordinator will continue to follow for discharge planning needs throughout hospital course    Riki Altes RN, BSN  Acute Care Coordinator, Emergency Dept  Phone: [phone] pager: [place]  Weekend pager: [phone]

## 2018-09-29 NOTE — Progress Notes (Signed)
HOME CARE DISCHARGE PLAN    The following services have been arranged with [facility] [phone]/ [phone]:  __XX____Nursing   ______Physical Therapy   ______Occupational Therapy  ______Speech Therapist  ______Medical Social Worker  ______Home Health Aide Evaluation  ______Home Delivered Meals    The agency will call to schedule first home visit date for [facility] services within 24-48 hours of facility discharge and upon insurance approval, and pt/family are in agreement.    Special instructions for scheduling first visit - Please call ahead     The following medical equipment and/or supplies have been arranged thru: N/A     Blood draw is not applicable __XX___    The patients identified caregiver is Helyn App (sister) [phone]    Focus of care and identified patient/caregiver teaching needs at home - Review of all systems, disease process, medication management, home safety, abscess assessment     Follow-up MD appts scheduled:  Sherlon Handing, NP  Go on 10/04/2018  at 2:20pm, for hospital follow up with your PCP  [address]  [address]  [place] [address]  [phone]     The above arrangements are based on an in-hospital evaluation. It is short-term and will be re-evaluated by the [place] Nurse in the home on a regular basis, as per physicians order.          Franz Dell, RN [facility] for Cindy Lighter, RN [facility]  [facility]  Cell: [phone]  After hours: [phone], Option - 4  Weekends/Holidays: [phone]  Fax: [phone]

## 2018-09-29 NOTE — Progress Notes (Signed)
Home Health Assessment    Completed by: Alize Borrayo, RN for Sue Kobel, RN  Phone: [phone]    Referred by: Nora Reidy, RN [facility]    Source of Information: medical record    Home Health indicators present: May need PT, OT or ST at home    Barriers to discharge to be addressed: None noted at this time, will continue to assess.     Plan: Home care referral started; agency chosen [facility]      Comments: [facility] has reviewed the patient's e-record. HCC will follow this patient's hospital course for home care needs; as well as to assist with a planning a safe [facility] discharge, and effective home care plan. Please call for questions or concerns regarding the discharge plan. Thank you for this referral.     Elmarie Devlin, RN [facility] for Sue Kobel, RN [facility]  [facility]  Cell: [phone]  After hours: [phone], Option - 4  Weekends/Holidays: [phone]  Fax: [phone]

## 2018-09-29 NOTE — ED Notes (Signed)
Pt up and ambulatory without assistance. Pt tolerating PO intake. Pt discharge instructions reviewed. Pt comfortable with discharge planning and verbalizes understanding of discharge instruction. Pt will follow up with PCP. Pt belongings with pt. Pt safe to discharge at this time.

## 2018-09-29 NOTE — ED Notes (Signed)
Assumed care for patient. Patient resting in room, in NAD, chest rising. Bed is locked and in lowest position, call light is within reach. Will continue to monitor and treat per orders.
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
## 2018-09-29 NOTE — ED Notes (Signed)
Assumed care of pt. Pt resting comfortably with call bell within reach. Will continue to monitor and treat pt per provider's orders.

## 2018-09-29 NOTE — Progress Notes (Signed)
Hospital Medicine Service Attending Progress Note      General Daily SOAP Progress Note for Inpatients   LOS: 1 day       Subjective     Patient doing well this morning. S/p I&D overnight in ED. Now s/p Vanc (not allergic). Patient doing well and ready to go home.    Vitals: Blood pressure (!) 148/94, pulse 81, temperature 36.3 C (97.3 F), temperature source Temporal, resp. rate 18, SpO2 99 %.   Vital-Signs Ranges: Temp:  [35.4 C (95.7 F)-37.8 C (100 F)] 36.3 C (97.3 F)  Heart Rate:  [80-120] 81  Resp:  [16-22] 18  BP: (88-148)/(56-98) 148/94       Intake/Output last 3 shifts:  I/O last 3 completed shifts:  10/02 0700 - 10/03 0659  In: 2000 [IV [id number]:4332]  Out: -   Net: 2000  Intake/Output this shift:  I/O this shift:  10/03 0700 - 10/03 1459  In: 613 [IV RJJOACZYS:063]  Out: -   Net: 613    Nursing pain score: Last Nursing documented pain:  0-10 Scale: 0 (09/29/18 0955)      Objective:  General Appearance:  Comfortable, well-appearing and in no acute distress.    Vital signs: (most recent): Blood pressure (!) 148/94, pulse 81, temperature 36.3 C (97.3 F), temperature source Temporal, resp. rate 18, SpO2 99 %.    HEENT: Normal HEENT exam.    Lungs:  Normal effort and normal respiratory rate.  Breath sounds clear to auscultation.  She is not in respiratory distress.  No decreased breath sounds.    Heart: Normal rate.  Regular rhythm.  S1 normal and S2 normal.    Abdomen: Abdomen is soft and non-distended.  There is no abdominal tenderness.   (Urostomy in place).   Extremities: (Right axilla s/p I&D. No further fluctuation or pocket. No tenderness to palpation. No erythema)  Neurological: Patient is alert and oriented to person, place and time.    Skin:  Warm and dry.          Lab Results:   All labs in the last 24 hours:   Recent Results (from the past 24 hour(s))   Lactate, plasma    Collection Time: 09/28/18  4:52 PM   Result Value Ref Range    Lactate 2.8 (H) 0.5 - 2.2 mmol/L   CBC and  differential    Collection Time: 09/28/18  4:52 PM   Result Value Ref Range    WBC 12.9 (H) 4.0 - 10.0 THOU/uL    RBC 4.7 3.9 - 5.2 MIL/uL    Hemoglobin 14.2 11.2 - 15.7 g/dL    Hematocrit 44 34 - 45 %    MCV 94 79 - 95 fL    MCH 30 26 - 32 pg/cell    MCHC 32 32 - 36 g/dL    RDW 14.7 (H) 11.7 - 14.4 %    Platelets 122 (L) 160 - 370 THOU/uL    Seg Neut % 54.8 %    Lymphocyte % 40.9 %    Monocyte % 4.3 %    Eosinophil % 0.0 %    Basophil % 0.0 %    Neut # K/uL 7.1 (H) 1.6 - 6.1 THOU/uL    Lymph # K/uL 5.3 (H) 1.2 - 3.7 THOU/uL    Mono # K/uL 0.6 0.2 - 0.9 THOU/uL    Eos # K/uL 0.0 0.0 - 0.4 THOU/uL    Baso # K/uL 0.0 0.0 - 0.1 THOU/uL  Nucl RBC % 0.1 0.0 - 0.2 /100 WBC    Nucl RBC # K/uL 0.0 0.0 - 0.0 THOU/uL   Plasma profile 7 (Adult ED only)    Collection Time: 09/28/18  4:52 PM   Result Value Ref Range    Chloride,Plasma 103 96 - 108 mmol/L    CO2,Plasma 21 20 - 28 mmol/L    Potassium,Plasma 4.0 3.4 - 4.7 mmol/L    Sodium,Plasma 141 133 - 145 mmol/L    Anion Gap,PL 17 (H) 7 - 16    UN,Plasma 9 6 - 20 mg/dL    Creatinine 0.45 (L) 0.51 - 0.95 mg/dL    GFR,Caucasian 132 *    GFR,Black 152 *    Glucose,Plasma 112 (H) 60 - 99 mg/dL   Diff manual    Collection Time: 09/28/18  4:52 PM   Result Value Ref Range    Giant PLTs Present     Manual DIFF RESULTS     Diff Based On 115 CELLS   Blood culture    Collection Time: 09/28/18  4:53 PM   Result Value Ref Range    Bacterial Blood Culture .    Hold SST    Collection Time: 09/28/18  4:59 PM   Result Value Ref Range    Hold SST HOLD TUBE    Blood culture    Collection Time: 09/28/18  5:42 PM   Result Value Ref Range    Bacterial Blood Culture .    Urinalysis with Microscopic UA    Collection Time: 09/28/18  5:42 PM   Result Value Ref Range    Color, UA Yellow Yellow    Appearance,UR 1+ Cloudy (!) Clear    Specific Gravity,UA 1.012 1.002 - 1.030    Leuk Esterase,UA 1+ (!) NEGATIVE    Nitrite,UA NEG NEGATIVE    pH,UA 6.0 5.0 - 8.0    Protein,UA 30 (!) NEGATIVE mg/dL     Glucose,UA NORM mg/dL    Ketones, UA NEG NEGATIVE    Blood,UA 2+ (!) NEGATIVE    RBC,UA 3 (!) 0 - 2 /hpf    WBC,UA 20 (H) 0 - 5 /hpf    WBC Clumps,UR 1+ (!)     Bacteria,UA 2+ (!)     Amorphous,UA Present     Mucus,UA Present    Bacterial urine culture    Collection Time: 09/28/18  5:42 PM   Result Value Ref Range    Aerobic Culture .    *Lactate venous, whole blood    Collection Time: 09/29/18  3:22 AM   Result Value Ref Range    Lactate VEN,WB 0.9 0.5 - 2.2 mmol/L   Basic metabolic panel    Collection Time: 09/29/18  3:22 AM   Result Value Ref Range    Glucose 106 (H) 60 - 99 mg/dL    Sodium 140 133 - 145 mmol/L    Potassium 3.4 3.3 - 5.1 mmol/L    Chloride 106 96 - 108 mmol/L    CO2 23 20 - 28 mmol/L    Anion Gap 11 7 - 16    UN 10 6 - 20 mg/dL    Creatinine 0.46 (L) 0.51 - 0.95 mg/dL    GFR,Caucasian 131 *    GFR,Black 151 *    Calcium 8.8 8.8 - 10.2 mg/dL   CBC and differential    Collection Time: 09/29/18  3:22 AM   Result Value Ref Range    WBC 8.0 4.0 - 10.0 THOU/uL  RBC 3.8 (L) 3.9 - 5.2 MIL/uL    Hemoglobin 11.4 11.2 - 15.7 g/dL    Hematocrit 35 34 - 45 %    MCV 94 79 - 95 fL    MCH 30 26 - 32 pg/cell    MCHC 32 32 - 36 g/dL    RDW 14.7 (H) 11.7 - 14.4 %    Platelets 85 (L) 160 - 370 THOU/uL    Seg Neut % 57.0 %    Lymphocyte % 34.2 %    Monocyte % 8.0 %    Eosinophil % 0.5 %    Basophil % 0.1 %    Neut # K/uL 4.6 1.6 - 6.1 THOU/uL    Lymph # K/uL 2.8 1.2 - 3.7 THOU/uL    Mono # K/uL 0.6 0.2 - 0.9 THOU/uL    Eos # K/uL 0.0 0.0 - 0.4 THOU/uL    Baso # K/uL 0.0 0.0 - 0.1 THOU/uL    Nucl RBC % 0.0 0.0 - 0.2 /100 WBC    Nucl RBC # K/uL 0.0 0.0 - 0.0 THOU/uL    IMM Granulocytes # 0.0 THOU/uL    IMM Granulocytes 0.2 %   Hold gray    Collection Time: 09/29/18  3:30 AM   Result Value Ref Range    Hold Grey HOLD TUBE        Currently Active/Followed Hospital Problems:  Active Hospital Problems    Diagnosis    Abscess       Assessment and Plan Section:  Assessment:  (32 y.o. female with a hx of autonomic  dysfunction, depression, frequent UTIs, muscle spasticity, neurogenic bladder, and quadriplegia at C6,  who presents with arm pain and swelling. Found to have right axilla abscess and sepsis physiology    Plan    #Severe Sepsis from axilla abscess  - s/p IVF  - Initial lactate elevated, lactate downtrending  - Cx pending  - s/p I&D in ED  - s/p vanc  - Will switch to oral Bactrim      DVT PPx: Lovenox    Code Status: Full    Discharge Planning:   Est. Discharge Date (EDD): 10/3   Discharge Criteria/Barriers to Discharge: n/a  PT and/or OT Recommendations/Discharge to: n/a  Appointments Needed with: PCP       Case discussed with APP, patient.  ).       Author: Cristy Hilts, MD  as of: 09/29/2018  at: 2:19 PM

## 2018-09-29 NOTE — Discharge Instructions (Signed)
Brief Summary of Your Hospital Course:        You were treated for an abscess until your arm. This was drained in Emergency Department and you should continue antibiotics for 7 days.   Follow up with your primary care doctor.         If you experience any of these symptoms 24 hours or more after discharge:Uncontrolled pain, Chest pain, Shortness of breath, Fever of 101 F. or greater, Chills, Poor appetite, Poor urinary output, Vomiting, Nausea, Diarrhea, Blood in stool or Weakness  Call Dr. Sander Nephew      If you experience any of these symptoms within the first 24 hours after discharge call Dr. Maurine Simmering at [phone]      Smoking    Smoking can increase your chances of developing chronic health problems or worsen conditions you already have.  If you smoke you should quit. Smoking cessation information has been given to you for your review to help you quit.  Medications to help you quit are available.  Ask your doctor if you would like to receive these medications.       Medications  Your doctor has prescribed medications to improve or manage your condition.  You should take them as prescribed by your doctor.  Ask your doctor for any questions regarding these medications.    Diet  A healthy diet is important to help you stay well.  Some health conditions require you to be on a special diet.  You should monitor your fluid intake and limit the amount of sodium including table salt.  This will help you avoid fluid retention which can cause shortness of breath or swelling of the feet and ankles.  Reading food labels is helpful when you are on a special diet.  Follow instructions from your doctor for any other special dietary requirement.    Exercise/Activity  Activity and exercise are important to your well being.  While you are in the hospital your activity may be restricted.  As your condition improves your activity level will be increased.  Most patients will be able to gradually resume activity as before.  You should follow  your doctors activity recommendations      What to do if your condition changes?  Once you leave the hospital you should contact your doctors office to make a follow up appointment.  If at any time you have any questions or concerns or your condition gets worse, contact your physician.  If you can not reach your physician or you develop life threatening symptoms such as trouble breathing or chest pain you should go to the closest Emergency Department.     Education  You may receive additional information on your specific condition before you are discharged.  Please ask your nurse or doctor for your information before you leave the hospital.
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
## 2018-09-29 NOTE — Consults (Signed)
Referrals for ureterostomy   Pt with [facility]- states she changed wafer and pouch yesterday before coming to ED. No problems or concerns. Stoma is red wet and viable with yellow urine.      No further needs identified, Pt will let nursing know if she needs supplies.    [place] nurse will sign off    Gonzella Lex MS RN [facility]  Pager [phone]
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
## 2018-09-30 ENCOUNTER — Ambulatory Visit: Payer: MEDICAID | Admitting: Internal Medicine

## 2018-09-30 ENCOUNTER — Telehealth: Payer: Self-pay | Admitting: Internal Medicine

## 2018-09-30 LAB — AEROBIC CULTURE

## 2018-09-30 NOTE — Telephone Encounter (Signed)
listed as [facility] ED case, however Cindy Ochoa was actually discharged from the ED Inpatient Boarder Unit                     Review of AVS:    Date of Discharge:    09/29/18    Reason for admission:     Abscess     Discharged to :     Home    On Coumadin or Warfarin:  no    Follow up appointment:    10/04/2018 2:20 PM Sherlon Handing, NP [facility]   11/29/2018 11:30 AM Otilio Carpen, PA [facility] at [facility]   12/07/2018 8:10 AM Ezequiel Ganser, MD [facility]   12/15/2018 11:00 AM Munsiff, Elvina Mattes, MD [facility]   12/23/2018 10:45 AM RIS, RR US5 (RRUS5) [facility] at [facility]   01/02/2019 9:00 AM Alena Bills, MD Physical Medicine & Rehabilitation   01/03/2019 10:30 AM Alden Server, MD [facility] @ [facility]   01/04/2019 12:45 PM Alena Bills, MD Physical Medicine & Rehabilitation

## 2018-09-30 NOTE — Telephone Encounter (Signed)
Care Management Transition Care Assessment    Date of Discharge Call:  09/30/2018  Left message for pt, mother, sent MyChart.   Brief MyChart exchange.    Reason for admission:    abcess                       Patient Assessment:    Do you have any health concerns since your discharge? :  Pt declines to take call, vague MyChart message stating she's feeling about the same, if she gets worse she will report to ED.  Discussed with [name] NP who supports this plan with available assessment data.    Do you have all of your medications?  [pt declines to discuss]    Do you have any problems taking your medications?    Pt declines assessment      Are you on any anticoagulation medication?  no    Are you having any problems getting around your house ? Do you need to use an assistive device ?  Paraplegia, uses assistive device    Do you have any issues that cause you stress ?     Pt declines assessment.    Who are the most important people in your life that provide you support?  Pt declines assessment, per chart pt with 126 hours/week of aide service through [place]    Transportation plan:  [facility] provides    Plan for Follow up appointment:  Review admission.      Health Home Care Management Eligibility Assessment :    Eligible for health home care management? No, has TBI waiver, not eligible.       [place] Manager  [facility]  [phone]  F: [phone]

## 2018-09-30 NOTE — Progress Notes (Signed)
09/30/18 1600   UM Patient Class Review   Patient Class Review Inpatient   Patient Class Effective 09/28/18    Jacqualyn Posey  RN   [employer]  [phone]   Pager:  [phone]

## 2018-10-03 ENCOUNTER — Telehealth: Payer: Self-pay | Admitting: Student in an Organized Health Care Education/Training Program

## 2018-10-03 NOTE — Telephone Encounter (Signed)
Called patient to let her know that renal ultrasound showed no kidney stones or any abnormalities. Patient expressed understanding. Asked patient how she has been doing since her ED visit. Patient says she is doing much better and will follow up with [name] on Oct 14.     Ezequiel Ganser, MD  PGY-1

## 2018-10-04 ENCOUNTER — Ambulatory Visit: Payer: MEDICAID | Admitting: Internal Medicine

## 2018-10-04 LAB — BLOOD CULTURE
Bacterial Blood Culture: 0
Bacterial Blood Culture: 0

## 2018-10-05 ENCOUNTER — Ambulatory Visit: Payer: MEDICAID | Admitting: Internal Medicine

## 2018-10-10 ENCOUNTER — Encounter: Payer: Self-pay | Admitting: Internal Medicine

## 2018-10-10 ENCOUNTER — Ambulatory Visit: Payer: MEDICAID | Admitting: Internal Medicine

## 2018-10-20 ENCOUNTER — Telehealth: Payer: Self-pay | Admitting: Internal Medicine

## 2018-10-20 NOTE — Telephone Encounter (Signed)
Copied from [place] #[id number].  >> Oct 20, 2018  3:45 PM Cindy Ochoa wrote:  Patient is calling to request an referral to wound care clinic. Patient stated she do not want the Nurses coming out. Patient stated she can not be available with the Nurses schedule. Patient can be reached at [phone]

## 2018-10-21 ENCOUNTER — Telehealth: Payer: Self-pay | Admitting: Internal Medicine

## 2018-10-21 DIAGNOSIS — L89159 Pressure ulcer of sacral region, unspecified stage: Secondary | ICD-10-CM

## 2018-10-21 NOTE — Telephone Encounter (Signed)
Called patient to let her know that referral to wound clinic has been put in.

## 2018-10-21 NOTE — Telephone Encounter (Signed)
Called patient with an interest in learning which areas she wanted wound care to look at. Patient did not pick up the phone. Left a generic voicemail. If patient calls back, would like to learn which areas she would want wound care to look at.    Cindy Ochoa  10/21/18 at 9:54am
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
## 2018-10-26 ENCOUNTER — Ambulatory Visit: Payer: MEDICAID | Admitting: Internal Medicine

## 2018-10-31 ENCOUNTER — Encounter: Payer: Self-pay | Admitting: Internal Medicine

## 2018-10-31 ENCOUNTER — Ambulatory Visit: Payer: MEDICAID | Attending: Internal Medicine | Admitting: Internal Medicine

## 2018-10-31 VITALS — BP 120/70 | HR 84 | Temp 96.4°F | Ht 65.0 in | Wt 165.0 lb

## 2018-10-31 DIAGNOSIS — N390 Urinary tract infection, site not specified: Secondary | ICD-10-CM | POA: Insufficient documentation

## 2018-10-31 DIAGNOSIS — L0291 Cutaneous abscess, unspecified: Secondary | ICD-10-CM | POA: Insufficient documentation

## 2018-10-31 DIAGNOSIS — L89159 Pressure ulcer of sacral region, unspecified stage: Secondary | ICD-10-CM | POA: Insufficient documentation

## 2018-10-31 MED ORDER — SULFAMETHOXAZOLE-TRIMETHOPRIM 800-160 MG PO TABS *I*
1.0000 | ORAL_TABLET | Freq: Two times a day (BID) | ORAL | 0 refills | Status: DC
Start: 2018-10-31 — End: 2019-01-04

## 2018-10-31 NOTE — Patient Instructions (Signed)
Dermatology [facility] [phone]   [address]  [address]

## 2018-11-01 ENCOUNTER — Other Ambulatory Visit: Payer: Self-pay | Admitting: Urology

## 2018-11-01 ENCOUNTER — Telehealth: Payer: Self-pay | Admitting: Urology

## 2018-11-01 DIAGNOSIS — N319 Neuromuscular dysfunction of bladder, unspecified: Secondary | ICD-10-CM

## 2018-11-01 MED ORDER — OSTOMY SUPPLIES MISC
11 refills | Status: DC
Start: 2018-11-01 — End: 2023-03-04

## 2018-11-01 MED ORDER — OSTOMY SUPPLIES WAFR
WAFER | 99 refills | Status: DC
Start: 2018-11-01 — End: 2023-03-04

## 2018-11-01 MED ORDER — OSTOMY SUPPLIES POUCH MISC
11 refills | Status: DC
Start: 2018-11-01 — End: 2023-03-04

## 2018-11-01 NOTE — Progress Notes (Signed)
Reason For Visit: The primary encounter diagnosis was Abscess. Diagnoses of Urinary tract infection and Decubitus ulcer of sacral region were also pertinent to this visit.      HPI:  Cindy Ochoa is 32 y.o. year old female with autonomic dysfunction, frequent UTI, quadriparesis , neurogenic bladder, neurogenic bowel    Here today with sweating more than normal   Urine is foul smelling  She has no old bag on today   She is has not followed up with urology     Abscess right axilla recently drained  It has not completely resolved    She has decubi that per her aid is getting worse   Has follow up with wound care     Social  Here with aid today     Medications:     Current Outpatient Medications   Medication Sig    sulfamethoxazole-trimethoprim (BACTRIM DS,SEPTRA DS) 800-160 MG per tablet Take 1 tablet by mouth 2 times daily    generic DME Repairs to hospital bed/hoyer lift  ICD 10: G82.54   Ht: 1.46m Wt: 76.2 kg    bisacodyl (BISAC-EVAC) 10 MG suppository Place 1 suppository (10 mg total) rectally daily as needed    polyethylene glycol (GLYCOLAX) powder Take 17 g by mouth daily   Mix in 8 oz water or juice and drink.    senna-docusate (PERICOLACE) 8.6-50 MG per tablet Take 2 tablets by mouth daily as needed for Constipation    docusate sodium (COLACE) 100 MG capsule Take 1 capsule (100 mg total) by mouth 2 times daily    mirtazapine (REMERON) 7.5 MG tablet Take 1 tablet (7.5 mg total) by mouth nightly    Non-System Medication Urostomy drainage bags 2000 cc change as needed. Dx N39.46 and  G82.54    Ostomy Supplies [facility] Coloplast Urostomy two piece bag and wafer 1 1/4 "H  7/8 "V N31.9 Neuromuscular dysfunction of the bladder. Use as directed.    disposable underpads 30"x36" (CHUX) Use 6 times daily and PRN. 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 Dx N39.46 and  G82.54     Ostomy Supplies MISC Coloplast Urostomy two piece bag and wafer 1 1/4 "H  7/8 "V N31.9 Neuromuscular dysfunction of the bladder    Ostomy Supplies Pouch MISC Coloplast Urostomy two piece bag and wafer 1 1/4 "H  7/8 "V N31.9 Neuromuscular dysfunction of the bladder Use as directed    Ostomy Supplies MISC Barrier ring. Use as directed.    incontinence supply disposable Large pull ups - use up to 5 x daily  Dx N39.46    disposable gloves 1 box Dynarex PF Vinyl Gloves    Gauze Pads & Dressings (ABDOMINAL PAD) 8"X10" PADS By 1 each no specified route daily   Cover buttock wounds 2x daily    Adhesive Tape (MEDIPORE SURGICAL 2"X10YD) TAPE Secure the 2 buttocks  wound dressings 2x day    Wound Dressings (HYDROFERA BLUE 4"X4") PADS Cut and moisten with saline as directed and pack in to buttock wound daily    Non-System Medication Gel overlay mattress for hospital bed - diagnosis G82.54, L89.93, L89.159    acetaminophen (MAPAP) 500 mg tablet Take 1 tablet (500 mg total) by mouth every 4-6 hours as needed   for pain    albuterol HFA 108 (90 Base) MCG/ACT inhaler Inhale 1-2 puffs into the  lungs every 6 hours as needed   Shake well before each use.    Non-System Medication Easy Tip Leg Bags 1000mg  - diagnosis G82.53 N39.46    etonogestrel (NEXPLANON) 68 MG IMPL Inject 68 mg into the skin once   Placed 10/20/16    generic DME Use as directed. Hospital bed with Low air loss mattress. Duration of use: 1 year.    Non-System Medication Hospital Bed with Air Mattress - Diagnosis G82.54, L89.93,L89.159  Medicaid [id number]     No current facility-administered medications for this visit.        Medication list reconciled this visit    Allergies:     Allergies   Allergen Reactions    Nitrofurantoin Nausea And Vomiting    Vancomycin Hives     hives 2006 but tolerated Rx in 2010  pt states she had vancomycin in 04/2012.  Feels this is not true allergy  as she has received it recently - had no reaction     Heparin Other  (See Comments)     Thrombocytopenia;        Social history      Social History     Tobacco Use    Smoking status: Current Some Day Smoker     Packs/day: 0.25     Years: 3.00     Pack years: 0.75     Types: Cigarettes     Start date: 12/09/2011     Last attempt to quit: 07/30/2016     Years since quitting: 2.2    Smokeless tobacco: Former Systems developer     Quit date: 04/29/2005   Substance Use Topics    Alcohol use: No       Review of Systems     ROS  12 point review of system was negative except those mentioned in HPI    Physical Exam:     Physical Exam  Constitutional:       Appearance: She is well-developed.      Comments: Sweating during the visit    Cardiovascular:      Rate and Rhythm: Normal rate and regular rhythm.      Heart sounds: Normal heart sounds.      Comments: No edema   Pulmonary:      Effort: Pulmonary effort is normal. No respiratory distress.      Breath sounds: Normal breath sounds. No wheezing or rales.   Abdominal:      General: Bowel sounds are normal.      Palpations: Abdomen is soft.      Tenderness: There is no tenderness.   Skin:     Comments: Small lump right axilla  No signs of infection    Neurological:      Mental Status: She is alert and oriented to person, place, and time.            Vitals:    10/31/18 1054   BP: 120/70   Pulse: 84   Temp: 35.8 C (96.4 F)   TempSrc: Oral   Weight: 74.8 kg (165 lb)   Height: 1.651 m (5\' 5" )     Wt Readings from Last 3 Encounters:   10/31/18 74.8 kg (165 lb)   09/28/18 74.8 kg (165 lb)   09/19/18 75.8 kg (167 lb)     BP Readings from Last 3 Encounters:   10/31/18 120/70   09/29/18 (!) 148/94   09/28/18 (!) 88/72           ASSESSMENT/PLAN:  Diagnoses and all orders for this visit:    Abscess seems to be resolving     Urinary tract infection ?    Decubitus ulcer of sacral region ?      Increase sweating in the past has been from UTI, unable to get clean specimen today  She has been septic in the past  bp and temp good  Check cbc and chem 8   -      sulfamethoxazole-trimethoprim (BACTRIM DS,SEPTRA DS) 800-160 MG per tablet; Take 1 tablet by mouth 2 times daily    ED if worsening symptoms     Encourage he to follow by urology    Decubi will be evaluated by wound care later this week       Lavone Orn,  Nurse Practitioner  [facility] Internal  Medicine

## 2018-11-01 NOTE — Progress Notes (Signed)
Reordered to [place] patient to inform. Left VM

## 2018-11-01 NOTE — Telephone Encounter (Signed)
Copied from [place] [id number]. Topic: Access to Care - Speak to Provider/Office Staff  >> Nov 01, 2018  9:50 AM Algie Coffer wrote:  Ms.Copeman has ran out of her  ureterectomy supply and needs a refill. Sh would like it sent to [facility]    Her nest contact number is  [phone]

## 2018-11-02 ENCOUNTER — Encounter: Payer: Self-pay | Admitting: Gastroenterology

## 2018-11-02 ENCOUNTER — Other Ambulatory Visit: Payer: Self-pay | Admitting: Student in an Organized Health Care Education/Training Program

## 2018-11-02 DIAGNOSIS — N319 Neuromuscular dysfunction of bladder, unspecified: Secondary | ICD-10-CM

## 2018-11-02 DIAGNOSIS — G8254 Quadriplegia, C5-C7 incomplete: Secondary | ICD-10-CM

## 2018-11-02 MED ORDER — GENERIC DME *A*
0 refills | Status: DC
Start: 2018-11-02 — End: 2024-05-16

## 2018-11-09 ENCOUNTER — Encounter: Payer: MEDICAID | Admitting: Surgery

## 2018-11-13 ENCOUNTER — Encounter: Payer: Self-pay | Admitting: Internal Medicine

## 2018-11-14 ENCOUNTER — Telehealth: Payer: Self-pay

## 2018-11-14 ENCOUNTER — Ambulatory Visit: Payer: MEDICAID | Admitting: Nurse Practitioner

## 2018-11-14 ENCOUNTER — Telehealth: Payer: Self-pay | Admitting: Internal Medicine

## 2018-11-14 NOTE — Telephone Encounter (Signed)
Patient is calling to schedule per referral from May. Please call her back at [phone]

## 2018-11-14 NOTE — Telephone Encounter (Signed)
Message noted.  Writer also responded to [employer].

## 2018-11-14 NOTE — Telephone Encounter (Signed)
That referral was scheduled and the patient no showed her appointment. Please review no-show/same day cancellation policy with patient prior to rescheduling.

## 2018-11-14 NOTE — Telephone Encounter (Signed)
Copied from [place] [id number]. Topic: Appointments - Cancel Appointment  >> Nov 14, 2018 11:20 AM Konrad Dolores wrote:  Patient called and cancelled today's symptomatic appointment due to having no one to be able to bring her.   She states if needed, she will take an ambulance into the hospital to get herself checked out or wait until her Wednesday appointment in another department since she'll already have the ride.     She states her rash/bite on back is okay, but burns only when water hits it.    If needed, she can be reached back at [phone] (home)

## 2018-11-15 ENCOUNTER — Ambulatory Visit: Payer: MEDICAID | Attending: Internal Medicine | Admitting: Internal Medicine

## 2018-11-15 ENCOUNTER — Encounter: Payer: Self-pay | Admitting: Internal Medicine

## 2018-11-15 VITALS — BP 128/74 | HR 106 | Temp 96.1°F | Ht 65.0 in | Wt 160.0 lb

## 2018-11-15 DIAGNOSIS — B029 Zoster without complications: Secondary | ICD-10-CM | POA: Insufficient documentation

## 2018-11-15 DIAGNOSIS — R21 Rash and other nonspecific skin eruption: Secondary | ICD-10-CM | POA: Insufficient documentation

## 2018-11-15 NOTE — Progress Notes (Signed)
Reason For Visit: The primary encounter diagnosis was Rash. A diagnosis of Shingles was also pertinent to this visit.      HPI:  Cindy Ochoa is 32 y.o. 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    Patient is here today for rash on her right upper back  Patient reports she had a burning sensation  Mother reports it started as blisters  Seems to be improving    Rash started 5 days ago    She denies fever or chills  No new environmental changes    Patient reports no injury      Medications:     Current Outpatient Medications   Medication Sig    generic DME Wafers  Dx code: N31.9  Qty: 10  Refills: 6    generic DME Urostomy Pouches  Dx Codes(s): N31.9  Qty: 20  Refills: 6    generic DME Leg bag  Dx Code: N31.9  Qty: 4  Refills: 6    generic DME Night drainage bag  Dx Code: N31.9  Qty: 4  Refills: 6    generic DME Barrier rings  Dx Code: N31.9  Qty: 15  Refills: 6    Ostomy Supplies [employer] Urostomy two piece bag and wafer 1 1/4 "H  7/8 "V N31.9 Neuromuscular dysfunction of the bladder    Ostomy Supplies [facility] Coloplast Urostomy two piece bag and wafer 1 1/4 "H  7/8 "V N31.9 Neuromuscular dysfunction of the bladder. Use as directed.    Ostomy Supplies Pouch [employer] Urostomy two piece bag and wafer 1 1/4 "H  7/8 "V N31.9 Neuromuscular dysfunction of the bladder Use as directed    Ostomy Supplies MISC Barrier ring. Use as directed.    sulfamethoxazole-trimethoprim (BACTRIM DS,SEPTRA DS) 800-160 MG per tablet Take 1 tablet by mouth 2 times daily    generic DME Repairs to hospital bed/hoyer lift  ICD 10: G82.54   Ht: 1.53m Wt: 76.2 kg    bisacodyl (BISAC-EVAC) 10 MG suppository Place 1 suppository (10 mg total) rectally daily as needed    polyethylene glycol (GLYCOLAX) powder Take 17 g by mouth daily   Mix in 8 oz water or juice and drink.    senna-docusate (PERICOLACE) 8.6-50 MG per tablet Take 2 tablets  by mouth daily as needed for Constipation    docusate sodium (COLACE) 100 MG capsule Take 1 capsule (100 mg total) by mouth 2 times daily    mirtazapine (REMERON) 7.5 MG tablet Take 1 tablet (7.5 mg total) by mouth nightly    Non-System Medication Urostomy drainage bags 2000 cc change as needed. Dx N39.46 and  G82.54    disposable underpads 30"x36" (CHUX) Use 6 times daily and PRN. 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 Dx N39.46 and  G82.54    incontinence supply disposable Large pull ups - use up to 5 x daily  Dx N39.46    disposable gloves 1 box Dynarex PF Vinyl Gloves    Gauze Pads & Dressings (ABDOMINAL PAD) 8"X10" PADS By 1 each no specified route daily   Cover buttock wounds 2x daily    Adhesive Tape (MEDIPORE SURGICAL 2"X10YD) TAPE Secure the 2 buttocks  wound dressings 2x day    Wound Dressings (HYDROFERA BLUE 4"X4") PADS Cut and moisten with saline as  directed and pack in to buttock wound daily    Non-System Medication Gel overlay mattress for hospital bed - diagnosis G82.54, L89.93, L89.159    acetaminophen (MAPAP) 500 mg tablet Take 1 tablet (500 mg total) by mouth every 4-6 hours as needed   for pain    albuterol HFA 108 (90 Base) MCG/ACT inhaler Inhale 1-2 puffs into the lungs every 6 hours as needed   Shake well before each use.    Non-System Medication Easy Tip Leg Bags 1000mg  - diagnosis G82.53 N39.46    etonogestrel (NEXPLANON) 68 MG IMPL Inject 68 mg into the skin once   Placed 10/20/16    generic DME Use as directed. Hospital bed with Low air loss mattress. Duration of use: 1 year.    Non-System Medication Hospital Bed with Air Mattress - Diagnosis G82.54, L89.93,L89.159  Medicaid [id number]     No current facility-administered medications for this visit.        Medication list reconciled this visit    Allergies:     Allergies   Allergen  Reactions    Nitrofurantoin Nausea And Vomiting    Vancomycin Hives     hives 2006 but tolerated Rx in 2010  pt states she had vancomycin in 04/2012.  Feels this is not true allergy  as she has received it recently - had no reaction     Heparin Other (See Comments)     Thrombocytopenia;        Social history      Social History     Tobacco Use    Smoking status: Current Some Day Smoker     Packs/day: 0.25     Years: 3.00     Pack years: 0.75     Types: Cigarettes     Start date: 12/09/2011     Last attempt to quit: 07/30/2016     Years since quitting: 2.2    Smokeless tobacco: Former Systems developer     Quit date: 04/29/2005   Substance Use Topics    Alcohol use: No       Review of Systems     ROS  12 point review of system was negative except those mentioned in HPI    Physical Exam:     Physical Exam  Vitals signs reviewed.   Constitutional:       Appearance: She is well-developed.   Cardiovascular:      Rate and Rhythm: Normal rate and regular rhythm.      Heart sounds: Normal heart sounds.      Comments: No edema   Pulmonary:      Effort: Pulmonary effort is normal. No respiratory distress.      Breath sounds: Normal breath sounds. No wheezing or rales.   Abdominal:      General: Bowel sounds are normal.      Palpations: Abdomen is soft.      Tenderness: There is no tenderness.      Comments: Urostomy tube present   Skin:     General: Skin is warm and dry.      Findings: Rash present.          Neurological:      Mental Status: She is alert and oriented to person, place, and time.            Vitals:    11/15/18 1317   BP: 128/74   BP Location: Right arm   Patient Position: Sitting   Cuff Size: large adult   Pulse:  106   Temp: 35.6 C (96.1 F)   TempSrc: Temporal   Weight: 72.6 kg (160 lb)   Height: 1.651 m (5\' 5" )     Wt Readings from Last 3 Encounters:   11/15/18 72.6 kg (160 lb)   10/31/18 74.8 kg (165 lb)   09/28/18 74.8 kg (165 lb)     BP Readings from Last 3 Encounters:   11/15/18 128/74   10/31/18 120/70   09/29/18  (!) 148/94           ASSESSMENT/PLAN:     Diagnoses and all orders for this visit:    Rash/Shingles  Presumed shingles  Hard to tell at this juncture  No treatment needed at this time  Watch for signs of infection or any change      Watch for any signs of skin breakdown given patient is quadriplegic    Lavone Orn,  Nurse Practitioner  [facility]

## 2018-11-16 ENCOUNTER — Ambulatory Visit: Payer: MEDICAID | Attending: Surgery | Admitting: Surgery

## 2018-11-16 ENCOUNTER — Other Ambulatory Visit: Payer: Self-pay | Admitting: Urology

## 2018-11-16 VITALS — BP 161/101 | HR 102 | Temp 96.1°F | Resp 16 | Ht 65.0 in | Wt 160.0 lb

## 2018-11-16 DIAGNOSIS — N319 Neuromuscular dysfunction of bladder, unspecified: Secondary | ICD-10-CM

## 2018-11-16 DIAGNOSIS — G8254 Quadriplegia, C5-C7 incomplete: Secondary | ICD-10-CM

## 2018-11-16 DIAGNOSIS — L905 Scar conditions and fibrosis of skin: Secondary | ICD-10-CM | POA: Insufficient documentation

## 2018-11-16 MED ORDER — NON-SYSTEM MEDICATION *A*
99 refills | Status: DC
Start: 2018-11-16 — End: 2024-05-16

## 2018-11-16 NOTE — Patient Instructions (Addendum)
Please follow these instructions about how to care for your wound.            Wound Location Dressing Orders Frequency of Dressing Change   Location:Sacrum, Right and Left Ischium  Your wounds have remained closed. Please use Calmoseptine daily and as needed in a thin layer for protection []  Daily          Cleanse Wound(s) with:   []  Normal Saline  []  Keep Dry    []  Soap & Water with dressing changes, keep dressing dry in between          []   ABI:   []   Right Leg:     []   Left Leg:       []   TBI:   []   Right Leg:     []   Left Leg:           []   Assistive Device(s):   [x]   Wheelchair   []   Walker   []   Cane   []   Crutches      []   Wedge Shoe   []   Other:     []   Pressure Reduction:   [x]   Wheelchair Cushion   []   Mattress Overlay      [x]   Specialty Bed   [x]   Reposition every 2 hours         []   Limit time up in your chair to 2 hours or less at a time   []   Other:    []   Home Health:    []   VNS   []   LTC   []   HCR   []   Other:    []   General:    []   Stop / Decrease Smoking   []   Multivitamin   []   Exercise   []   Other:     Covering provider   []   Dr. Marland Mcalpine     []   Discharge from [facility] Discharge Instructions    Following the instructions below will give you the best opportunity for wound healing.    Wound Care Instructions (If changing own dressing):   Gather all supplies you will need to change your dressing.   Before changing your dressing, wash your hands for at least 15 seconds with warm soapy water.   Rinse off all soap, then dry with a towel.   Remove the old dressing.  Wash hands again before applying the new dressing.    If you experience any of the following during our business hours of 8 AM - 4:30 PM, Monday - Friday, please call the [facility] at [phone]:    ? Increase in pain  ?Temperature over 101F   ?Drainage with a foul odor   ?Bleeding   ? Increase in swelling  ? Increase in drainage from your wound  ? Need for compression bandage  changes (slippage, breakthrough drainage)    Please contact your primary care physician or proceed to the nearest emergency room if you experience any of the above after our business hours.     Please note your appointment(s) above - if you are unable to keep, kindly give 24 hours' notice.  Thank You!

## 2018-11-16 NOTE — Telephone Encounter (Signed)
LVM to schedule
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
## 2018-11-17 NOTE — Telephone Encounter (Signed)
Copied from [place] [id number]. Topic: Appointments - Schedule Appointment  >> Nov 17, 2018  8:39 AM Ivette Loyal wrote:  Daphine Deutscher is returning this call from [facility] to schedule an appointment. Please reach out to her again at [phone].

## 2018-11-17 NOTE — Telephone Encounter (Signed)
Scheduled pt on 03/06  @ [place] location

## 2018-11-21 ENCOUNTER — Telehealth: Payer: Self-pay | Admitting: Internal Medicine

## 2018-11-21 DIAGNOSIS — G8254 Quadriplegia, C5-C7 incomplete: Secondary | ICD-10-CM

## 2018-11-21 NOTE — Telephone Encounter (Signed)
MD unavailable, Rx request routed to [facility] for 11/21/2018

## 2018-11-21 NOTE — Telephone Encounter (Signed)
Copied from [place] [id number]. Topic: Appointments - Cancel Appointment  >> Nov 21, 2018 11:38 AM Crista Elliot wrote:  Cindy Ochoa called requesting a referral to be sent to physical therapy for a [facility] lift and a shower chair.   She stated her [name] lift been broke but she was told she a script/referral needed to be sent.   If any questions can call [phone]

## 2018-11-22 ENCOUNTER — Encounter: Payer: Self-pay | Admitting: Gastroenterology

## 2018-11-22 MED ORDER — ADJUST BATH/SHOWER SEAT/BACK MISC *A*
0 refills | Status: AC
Start: 2018-11-22 — End: ?

## 2018-11-22 MED ORDER — ADJUST BATH/SHOWER SEAT/BACK MISC *A*
0 refills | Status: DC
Start: 2018-11-22 — End: 2018-11-22

## 2018-11-22 MED ORDER — PATIENT LIFT DEVI *A*
0 refills | Status: DC
Start: 2018-11-22 — End: 2018-11-22

## 2018-11-22 MED ORDER — PATIENT LIFT DEVI *A*
0 refills | Status: AC
Start: 2018-11-22 — End: ?

## 2018-11-22 NOTE — Telephone Encounter (Signed)
Spoke with patient and she would like the scripts faxed to [facility]. Patient does not know fax number so I will need to contact their office.

## 2018-11-29 ENCOUNTER — Ambulatory Visit: Payer: MEDICAID | Attending: Orthopedic Surgery | Admitting: Orthopedic Surgery

## 2018-11-29 ENCOUNTER — Encounter: Payer: Self-pay | Admitting: Orthopedic Surgery

## 2018-11-29 VITALS — BP 101/63 | HR 96 | Ht 65.0 in | Wt 160.0 lb

## 2018-11-29 DIAGNOSIS — M810 Age-related osteoporosis without current pathological fracture: Secondary | ICD-10-CM

## 2018-11-29 MED ORDER — CALCIUM CITRATE-VITAMIN D 315-250 MG-UNIT PO TABS *I*
2.0000 | ORAL_TABLET | Freq: Two times a day (BID) | ORAL | 5 refills | Status: AC
Start: 2018-11-29 — End: 2019-02-27

## 2018-11-29 MED ORDER — ERGOCALCIFEROL 50000 UNIT PO CAPS *I*
50000.0000 [IU] | ORAL_CAPSULE | ORAL | 0 refills | Status: DC
Start: 2018-11-29 — End: 2019-06-08
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
## 2018-12-01 ENCOUNTER — Other Ambulatory Visit
Admission: RE | Admit: 2018-12-01 | Discharge: 2018-12-01 | Disposition: A | Discharge status: Home / Self Care | Payer: MEDICAID | Source: Ambulatory Visit | Attending: Orthopedic Surgery | Admitting: Orthopedic Surgery

## 2018-12-01 DIAGNOSIS — L89159 Pressure ulcer of sacral region, unspecified stage: Secondary | ICD-10-CM

## 2018-12-01 DIAGNOSIS — L0291 Cutaneous abscess, unspecified: Secondary | ICD-10-CM

## 2018-12-01 DIAGNOSIS — N390 Urinary tract infection, site not specified: Secondary | ICD-10-CM

## 2018-12-01 DIAGNOSIS — M810 Age-related osteoporosis without current pathological fracture: Secondary | ICD-10-CM

## 2018-12-01 LAB — VITAMIN D: 25-OH Vit Total: 17 ng/mL — ABNORMAL LOW (ref 30–60)

## 2018-12-01 LAB — CBC AND DIFFERENTIAL
Baso # K/uL: 0 10*3/uL (ref 0.0–0.1)
Basophil %: 0.3 %
Eos # K/uL: 0.1 10*3/uL (ref 0.0–0.4)
Eosinophil %: 1.1 %
Hematocrit: 43 % (ref 34–45)
Hemoglobin: 13.9 g/dL (ref 11.2–15.7)
IMM Granulocytes #: 0 10*3/uL
IMM Granulocytes: 0.2 %
Lymph # K/uL: 3.4 10*3/uL (ref 1.2–3.7)
Lymphocyte %: 51.3 %
MCH: 31 pg/cell (ref 26–32)
MCHC: 32 g/dL (ref 32–36)
MCV: 95 fL (ref 79–95)
Mono # K/uL: 0.4 10*3/uL (ref 0.2–0.9)
Monocyte %: 5.9 %
Neut # K/uL: 2.8 10*3/uL (ref 1.6–6.1)
Nucl RBC # K/uL: 0 10*3/uL (ref 0.0–0.0)
Nucl RBC %: 0 /100 WBC (ref 0.0–0.2)
Platelets: 98 10*3/uL — ABNORMAL LOW (ref 160–370)
RBC: 4.5 MIL/uL (ref 3.9–5.2)
RDW: 14.8 % — ABNORMAL HIGH (ref 11.7–14.4)
Seg Neut %: 41.2 %
WBC: 6.7 10*3/uL (ref 4.0–10.0)

## 2018-12-01 LAB — URINALYSIS REFLEX TO CULTURE
Ketones, UA: NEGATIVE
Leuk Esterase,UA: NEGATIVE
Nitrite,UA: NEGATIVE
Protein,UA: 30 mg/dL — AB
Specific Gravity,UA: 1.015 (ref 1.002–1.030)
pH,UA: 6 (ref 5.0–8.0)

## 2018-12-01 LAB — URINE MICROSCOPIC (IQ200)

## 2018-12-01 LAB — BASIC METABOLIC PANEL
Anion Gap: 14 (ref 7–16)
CO2: 24 mmol/L (ref 20–28)
Calcium: 9.8 mg/dL (ref 8.8–10.2)
Chloride: 109 mmol/L — ABNORMAL HIGH (ref 96–108)
Creatinine: 0.58 mg/dL (ref 0.51–0.95)
GFR,Black: 140 *
GFR,Caucasian: 121 *
Glucose: 96 mg/dL (ref 60–99)
Lab: 14 mg/dL (ref 6–20)
Potassium: 4.2 mmol/L (ref 3.3–5.1)
Sodium: 147 mmol/L — ABNORMAL HIGH (ref 133–145)

## 2018-12-01 LAB — MULTIPLE ORDERING DOCS

## 2018-12-01 LAB — PTH, INTACT: Intact PTH: 41.7 pg/mL (ref 15.0–65.0)

## 2018-12-01 NOTE — Telephone Encounter (Signed)
Scripts were faxed yesterday.

## 2018-12-02 LAB — AEROBIC CULTURE: Aerobic Culture: 0

## 2018-12-07 ENCOUNTER — Ambulatory Visit: Payer: MEDICAID | Admitting: Student in an Organized Health Care Education/Training Program

## 2018-12-14 ENCOUNTER — Encounter: Payer: Self-pay | Admitting: Obstetrics and Gynecology

## 2018-12-15 ENCOUNTER — Ambulatory Visit: Payer: MEDICAID | Admitting: Infectious Diseases

## 2018-12-23 ENCOUNTER — Ambulatory Visit
Admission: RE | Admit: 2018-12-23 | Discharge: 2018-12-23 | Disposition: A | Discharge status: Home / Self Care | Payer: MEDICAID | Source: Ambulatory Visit | Attending: Urology | Admitting: Urology

## 2018-12-23 DIAGNOSIS — N319 Neuromuscular dysfunction of bladder, unspecified: Secondary | ICD-10-CM

## 2018-12-23 DIAGNOSIS — N281 Cyst of kidney, acquired: Secondary | ICD-10-CM

## 2019-01-02 ENCOUNTER — Telehealth: Payer: Self-pay

## 2019-01-02 ENCOUNTER — Ambulatory Visit: Payer: MEDICAID | Attending: Physical Medicine and Rehabilitation | Admitting: Physical Medicine and Rehabilitation

## 2019-01-02 ENCOUNTER — Other Ambulatory Visit: Payer: Self-pay

## 2019-01-02 VITALS — Temp 96.6°F

## 2019-01-02 DIAGNOSIS — G8253 Quadriplegia, C5-C7 complete: Secondary | ICD-10-CM | POA: Insufficient documentation

## 2019-01-02 DIAGNOSIS — R252 Cramp and spasm: Secondary | ICD-10-CM

## 2019-01-02 MED ORDER — ONABOTULINUMTOXINA 100 UNIT IJ SOLR *I*
600.0000 [IU] | Freq: Once | INTRAMUSCULAR | Status: AC
Start: 2019-01-02 — End: 2019-01-02
  Administered 2019-01-02: 600 [IU] via INTRAMUSCULAR

## 2019-01-02 NOTE — Progress Notes (Addendum)
Botulinum Toxin Injection Procedure Note     Pre-operative Diagnosis: C6 complete tetraplegia    Indications: Spasticity that adversely impacts function     Procedure Details   The risks, benefits, indications, potential complications, and alternatives were explained to the patient and/or guardian who verbalized understanding and informed consent obtained.     The area for injection was identified and a time out called to re-identify.      The correct patient was identified with 2 identifiers The correct procedure, location site(s) and laterality were identified with the patient and/or guardian and correspond to the consent form  The appropriate site was marked and verified.  The patient was placed in the correct position.    After prepping the skin with alcohol overlying the following muscles, botlinum toxin was injected intramuscularly.     R quadriceps (divided into 4 sites): 300 units  L quadriceps (divided into 4 sites): 300 units    Botulinum toxin Lot #: [id number]  Botulinum toxin expiration date:04/2021  Total botox units injected: 600 units   Total botox units wasted: 0 units     The patient tolerated the procedure without complications.  patient was seen and the procedure was performed with attending Dr Priscille Loveless.    Micah Noel, MBBS   PM&R resident      ATTENDING NOTE:     Patient seen and examined with resident physician, and case discussed. I have reviewed the above, and the findings and plan of care reflect my input.  I supervised the above procedure.    PT home care referral placed for: safety with transfers and evaluation for appropriateness and safety of equipment including hoyer and shower chair.    Alena Bills, MD   PMR ATTENDING

## 2019-01-02 NOTE — Telephone Encounter (Signed)
Called patient, number not in service.  Patients appt scheduled incorrectly and time needed to be changed.  If patient shows for originally scheduled appt please check with provider to see if they can be seen.

## 2019-01-02 NOTE — Progress Notes (Deleted)
Botulinum Toxin Injection Procedure Note     Pre-operative Diagnosis: C6 complete tetraplegia    Indications: Spasticity that adversely impacts function     Procedure Details   The risks, benefits, indications, potential complications, and alternatives were explained to the patient and/or guardian who verbalized understanding and informed consent obtained.     The area for injection was identified and a time out called to re-identify.      The correct patient was identified with 2 identifiers The correct procedure, location site(s) and laterality were identified with the patient and/or guardian and correspond to the consent form  The appropriate site was marked and verified.  The patient was placed in the correct position.    After prepping the skin with alcohol overlying the following muscles, botlinum toxin was injected intramuscularly.     R quadriceps (divided into 4 sites): 300 units  L quadriceps (divided into 4 sites): 300 units    Botulinum toxin Lot #: [id number]  Botulinum toxin expiration date: 09/2020  Total botox units injected: 600 units   Total botox units wasted: 0 units     The patient tolerated the procedure without complications.      Alena Bills, MD

## 2019-01-03 ENCOUNTER — Ambulatory Visit: Payer: MEDICAID | Admitting: Urology

## 2019-01-03 NOTE — Addendum Note (Signed)
Addended by: Alena Bills on: 01/03/2019 04:38 PM     Modules accepted: Orders

## 2019-01-04 ENCOUNTER — Ambulatory Visit: Payer: MEDICAID | Admitting: Physical Medicine and Rehabilitation

## 2019-01-04 ENCOUNTER — Ambulatory Visit: Payer: MEDICAID | Attending: Infectious Diseases | Admitting: Infectious Diseases

## 2019-01-04 ENCOUNTER — Encounter: Payer: Self-pay | Admitting: Infectious Diseases

## 2019-01-04 VITALS — BP 110/80 | HR 74 | Temp 97.0°F | Resp 16 | Ht 65.0 in | Wt 163.0 lb

## 2019-01-04 DIAGNOSIS — N390 Urinary tract infection, site not specified: Secondary | ICD-10-CM | POA: Insufficient documentation

## 2019-01-04 DIAGNOSIS — Z936 Other artificial openings of urinary tract status: Secondary | ICD-10-CM | POA: Insufficient documentation
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
## 2019-01-04 NOTE — Addendum Note (Signed)
Addended by: Alena Bills on: 01/04/2019 11:21 AM     Modules accepted: Orders

## 2019-01-06 ENCOUNTER — Other Ambulatory Visit: Payer: Self-pay | Admitting: Orthopedic Surgery

## 2019-01-06 DIAGNOSIS — M810 Age-related osteoporosis without current pathological fracture: Secondary | ICD-10-CM

## 2019-01-09 ENCOUNTER — Encounter: Payer: Self-pay | Admitting: Gastroenterology

## 2019-01-10 ENCOUNTER — Other Ambulatory Visit: Payer: Self-pay | Admitting: Internal Medicine

## 2019-01-10 DIAGNOSIS — N319 Neuromuscular dysfunction of bladder, unspecified: Secondary | ICD-10-CM

## 2019-01-10 DIAGNOSIS — G8250 Quadriplegia, unspecified: Secondary | ICD-10-CM

## 2019-01-10 DIAGNOSIS — G8254 Quadriplegia, C5-C7 incomplete: Secondary | ICD-10-CM

## 2019-01-10 NOTE — Telephone Encounter (Signed)
Dr Sander Nephew not available.  Medication request routed to [facility] for processing 01/10/2019 5:31 PM

## 2019-01-11 ENCOUNTER — Ambulatory Visit: Payer: MEDICAID | Attending: Ophthalmology | Admitting: Ophthalmology

## 2019-01-11 DIAGNOSIS — H04123 Dry eye syndrome of bilateral lacrimal glands: Secondary | ICD-10-CM | POA: Insufficient documentation

## 2019-01-11 DIAGNOSIS — H5010 Unspecified exotropia: Secondary | ICD-10-CM

## 2019-01-11 DIAGNOSIS — H5440 Blindness, one eye, unspecified eye: Secondary | ICD-10-CM

## 2019-01-11 DIAGNOSIS — H50111 Monocular exotropia, right eye: Secondary | ICD-10-CM

## 2019-01-11 MED ORDER — DISPOSABLE UNDERPADS 30"X36" MISC *A*
99 refills | Status: DC
Start: 2019-01-11 — End: 2023-02-12

## 2019-01-11 MED ORDER — ARTIFICIAL TEARS (POLYVINYL ALCOHOL) 1.4 % OP SOLN *I*
1.0000 [drp] | OPHTHALMIC | Status: DC | PRN
Start: 2019-01-11 — End: 2019-01-11

## 2019-01-11 MED ORDER — ARTIFICIAL TEARS (POLYVINYL ALCOHOL) 1.4 % OP SOLN *I*
1.0000 [drp] | OPHTHALMIC | 6 refills | Status: AC | PRN
Start: 2019-01-11 — End: 2019-02-10

## 2019-01-11 MED ORDER — DISPOSABLE GLOVES MISC *A*
5 refills | Status: AC
Start: 2019-01-11 — End: ?

## 2019-01-11 MED ORDER — INCONTINENCE SUPPLY DISPOSABLE MISC *A*
5 refills | Status: DC
Start: 2019-01-11 — End: 2023-02-12

## 2019-01-11 NOTE — Telephone Encounter (Signed)
Copied from [place] [id number]. Topic: Medications/Prescriptions - Refill Request  >> Jan 11, 2019  8:35 AM Tilden Dome wrote:  Daphine Deutscher calling to request prescription(s) underpads, pull up briefs large, and gloves large  to be sent to the following Pharmacy [facility].    Is patient out of the medication? No  Patient will be out by Sunday.       Patient can be reached if necessary at [phone].
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
## 2019-01-23 ENCOUNTER — Other Ambulatory Visit
Admission: RE | Admit: 2019-01-23 | Discharge: 2019-01-23 | Disposition: A | Discharge status: Home / Self Care | Payer: MEDICAID | Source: Ambulatory Visit | Attending: Orthopedic Surgery | Admitting: Orthopedic Surgery

## 2019-01-23 ENCOUNTER — Other Ambulatory Visit: Payer: Self-pay | Admitting: Gastroenterology

## 2019-01-23 DIAGNOSIS — M810 Age-related osteoporosis without current pathological fracture: Secondary | ICD-10-CM

## 2019-01-23 LAB — CREATININE, SERUM
Creatinine: 0.42 mg/dL — ABNORMAL LOW (ref 0.51–0.95)
GFR,Black: 155 *
GFR,Caucasian: 135 *

## 2019-01-23 LAB — CALCIUM: Calcium: 9.5 mg/dL (ref 8.8–10.2)

## 2019-01-23 LAB — VITAMIN D: 25-OH Vit Total: 18 ng/mL — ABNORMAL LOW (ref 30–60)

## 2019-01-23 LAB — UNABLE TO VOID

## 2019-01-26 ENCOUNTER — Encounter: Payer: Self-pay | Admitting: Urology

## 2019-01-31 ENCOUNTER — Telehealth: Payer: MEDICAID | Admitting: Internal Medicine

## 2019-01-31 ENCOUNTER — Encounter: Payer: Self-pay | Admitting: Gastroenterology

## 2019-01-31 ENCOUNTER — Encounter: Payer: Self-pay | Admitting: Urology

## 2019-01-31 ENCOUNTER — Ambulatory Visit: Payer: MEDICAID | Attending: Urology | Admitting: Urology

## 2019-01-31 VITALS — BP 150/83 | HR 80 | Temp 98.1°F

## 2019-01-31 DIAGNOSIS — N390 Urinary tract infection, site not specified: Secondary | ICD-10-CM | POA: Insufficient documentation

## 2019-01-31 DIAGNOSIS — N319 Neuromuscular dysfunction of bladder, unspecified: Secondary | ICD-10-CM | POA: Insufficient documentation

## 2019-01-31 NOTE — Telephone Encounter (Signed)
Copied from [place] [id number]. Topic: Information Request - Other  >> Jan 31, 2019 10:35 AM Thereasa Solo wrote:  Larene Beach from [employer] is calling because the patient's insurance company needs more details on why the patient needs a foyer lift from her PCP. Larene Beach is requesting the letter be faxed to [phone] ATTN :rehab    Larene Beach can be reached if necessary at [phone]

## 2019-01-31 NOTE — Patient Instructions (Signed)
D-[place]
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
## 2019-02-01 ENCOUNTER — Encounter: Payer: MEDICAID | Admitting: Student in an Organized Health Care Education/Training Program

## 2019-02-01 NOTE — Telephone Encounter (Signed)
Cindy Ochoa for clarification of details needed for note. Patient's prior hoyer lift was broken so it was thrown out. She is in need of a new one. Insurance thought that given she did not have one any longer that she would not need it. Have written a note clarifying this for the patient to be faxed and have also specified that the patient has quadriplegia and is in need of a hoyer lift.

## 2019-02-08 ENCOUNTER — Encounter: Payer: Self-pay | Admitting: Internal Medicine

## 2019-02-08 ENCOUNTER — Ambulatory Visit: Payer: MEDICAID | Attending: Gerontology | Admitting: Internal Medicine

## 2019-02-08 ENCOUNTER — Encounter: Payer: Self-pay | Admitting: Gastroenterology

## 2019-02-08 VITALS — HR 68 | Temp 96.8°F | Ht 65.0 in | Wt 162.0 lb

## 2019-02-08 DIAGNOSIS — F329 Major depressive disorder, single episode, unspecified: Secondary | ICD-10-CM | POA: Insufficient documentation

## 2019-02-08 DIAGNOSIS — H4900 Third [oculomotor] nerve palsy, unspecified eye: Secondary | ICD-10-CM

## 2019-02-08 DIAGNOSIS — K5909 Other constipation: Secondary | ICD-10-CM | POA: Insufficient documentation

## 2019-02-08 DIAGNOSIS — F32A Depression, unspecified: Secondary | ICD-10-CM

## 2019-02-08 DIAGNOSIS — G8254 Quadriplegia, C5-C7 incomplete: Secondary | ICD-10-CM

## 2019-02-08 MED ORDER — DOCUSATE SODIUM 100 MG PO CAPS *I*
100.0000 mg | ORAL_CAPSULE | Freq: Two times a day (BID) | ORAL | 5 refills | Status: DC
Start: 2019-02-08 — End: 2020-03-26

## 2019-02-08 MED ORDER — MIRTAZAPINE 7.5 MG PO TABS *A*
7.5000 mg | ORAL_TABLET | Freq: Every evening | ORAL | 2 refills | Status: DC
Start: 2019-02-08 — End: 2019-06-03

## 2019-02-08 MED ORDER — BISACODYL 10 MG RE SUPP *I*
10.0000 mg | Freq: Every day | RECTAL | 3 refills | Status: DC | PRN
Start: 2019-02-08 — End: 2019-09-01

## 2019-02-08 NOTE — Progress Notes (Signed)
Reason For Visit: Home health aid paperwork.    HPI:  Cindy Ochoa is 33 y.o. 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, presenting to the office for hoyer lift paper work. Last hoyer lift has been broken and requires a new one. Now using one that is borrowed. Medicaid covers one per lifetime. Patient denies any symptoms today, denies headache, chest pain, SOB, cough, N/V/D, dizziness or lightheadedness.     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    Thrombocytopenia D69.6    Health care maintenance Z00.00    Pressure ulcer stage III L89.93    Acne L70.9    Nexplanon insertion Z30.017    Headache, menstrual migraine G43.829    Dry eyes H04.123    Neurogenic bowel K59.2    Osteoporosis M81.0    Nephrolithiasis N20.0    Urolithiasis N20.9    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    Urinary tract infection N39.0    Controlled substance agreement signed Z79.899    Urinary tract infection without hematuria, site unspecified N39.0    UTI (urinary tract infection) N39.0    Abscess L02.91       Medications:     Current Outpatient Medications   Medication Sig    bisacodyl (BISAC-EVAC) 10 MG suppository Place 1 suppository (10 mg total) rectally daily as needed    docusate sodium (COLACE) 100 MG capsule Take 1 capsule (100 mg total) by mouth 2 times daily    mirtazapine (REMERON) 7.5 MG tablet Take 1 tablet (7.5 mg total) by mouth nightly    ,artificial tears, polyvinyl alcohol (LIQUIFILM TEARS) 1.4 % ophthalmic solution Place 1 drop into both eyes as needed for Dry  Eyes    ergocalciferol (ERGOCALCIFEROL) 50000 UNIT capsule Take 1 capsule (50,000 Units total) by mouth once a week    calcium citrate-vitamin D (CITRACAL+D) 315-250 MG-UNIT per tablet Take 2 tablets by mouth 2 times daily for 180 doses    polyethylene glycol (GLYCOLAX) powder Take 17 g by mouth daily   Mix in 8 oz water or juice and drink.    senna-docusate (PERICOLACE) 8.6-50 MG per tablet Take 2 tablets by mouth daily as needed for Constipation    Adhesive Tape (MEDIPORE SURGICAL 2"X10YD) TAPE Secure the 2 buttocks  wound dressings 2x day    Wound Dressings (HYDROFERA BLUE 4"X4") PADS Cut and moisten with saline as directed and pack in to buttock wound daily    acetaminophen (MAPAP) 500 mg tablet Take 1 tablet (500 mg total) by mouth every 4-6 hours as needed   for pain    albuterol HFA 108 (90 Base) MCG/ACT inhaler Inhale 1-2 puffs into the lungs every 6 hours as needed   Shake well before each use.    etonogestrel (NEXPLANON) 68 MG IMPL Inject 68 mg into the skin once   Placed 10/20/16    disposable underpads 30"x36" (CHUX) Use 6 times daily and PRN. Dx N39.42  Incontinence without sensory awareness    incontinence supply disposable Large pull ups - use up to 5 x daily  Dx N39.46    disposable gloves 1 box Dynarex PF Vinyl Gloves    patient lift Use as directed for patient lifting. For lifetime use; ICD 10: G82.54 Ht: 1.15m Wt: 76.2 kg    adjustable bath/shower seat with back For lifetime use; ICD 10: G82.54 Ht: 1.45m Wt: 76.2 kg    Non-System Medication Urostomy drainage bags 2000 cc change as needed. Dx N39.46 and  G82.54    generic DME Wafers  Dx code: N31.9  Qty: 10  Refills: 6    generic DME Urostomy Pouches  Dx Codes(s): N31.9  Qty: 20  Refills: 6    generic DME Leg bag  Dx Code: N31.9  Qty: 4  Refills: 6    generic DME Night drainage bag  Dx Code: N31.9  Qty: 4  Refills: 6    generic DME Barrier rings  Dx Code: N31.9  Qty: 15  Refills: 6    Ostomy Supplies [employer] Urostomy two  piece bag and wafer 1 1/4 "H  7/8 "V N31.9 Neuromuscular dysfunction of the bladder    Ostomy Supplies [facility] Coloplast Urostomy two piece bag and wafer 1 1/4 "H  7/8 "V N31.9 Neuromuscular dysfunction of the bladder. Use as directed.    Ostomy Supplies Pouch [employer] Urostomy two piece bag and wafer 1 1/4 "H  7/8 "V N31.9 Neuromuscular dysfunction of the bladder Use as directed    Ostomy Supplies MISC Barrier ring. Use as directed.    generic DME Repairs to hospital bed/hoyer lift  ICD 10: G82.54   Ht: 1.50m Wt: 76.2 kg    disposable gloves 2 boxes Disposable Medium size gloves N39.46    generic DME Urostomy drainage bags 2000 cc change as needed. Dx N39.46 and  G82.54    Gauze Pads & Dressings (ABDOMINAL PAD) 8"X10" PADS By 1 each no specified route daily   Cover buttock wounds 2x daily    Non-System Medication Gel overlay mattress for hospital bed - diagnosis G82.54, L89.93, L89.159    Non-System Medication Easy Tip Leg Bags 1000mg  - diagnosis G82.53 N39.46    generic DME Use as directed. Hospital bed with Low air loss mattress. Duration of use: 1 year.    Non-System Medication Hospital Bed with Air Mattress - Diagnosis G82.54, L89.93,L89.159  Medicaid [id number]     No current facility-administered medications for this visit.        Medication list reconciled this visit    Allergies:     Allergies   Allergen Reactions    Nitrofurantoin Nausea And Vomiting    Vancomycin Hives     hives 2006 but tolerated Rx in 2010  pt states she had vancomycin in 04/2012.  Feels this is not true allergy  as she has received it recently - had no reaction     Heparin Other (See Comments)     Thrombocytopenia;        Social history      Social History     Tobacco Use    Smoking status: Current Every Day Smoker     Packs/day: 0.25     Years: 3.00     Pack years: 0.75     Types: Cigarettes     Start date: 12/09/2011     Last attempt to quit: 07/30/2016     Years since quitting: 2.5    Smokeless tobacco: Former Systems developer      Quit date: 04/29/2005    Tobacco comment: 3 a day   Substance Use Topics  Alcohol use: No       Allergies, medications, social, medical and family history reviewed and updated.    Review of Systems     Review of Systems   Constitutional: Negative.    HENT: Negative.    Eyes: Negative.    Respiratory: Negative.    Cardiovascular: Negative.    Gastrointestinal: Negative.    Genitourinary: Negative.    Skin: Negative.    Neurological: Negative.    Psychiatric/Behavioral: Positive for depression. Negative for substance abuse and suicidal ideas. The patient does not have insomnia.      Physical Exam:     Physical Exam  Constitutional:       Appearance: Normal appearance. She is obese.   HENT:      Head: Normocephalic and atraumatic.   Cardiovascular:      Rate and Rhythm: Normal rate and regular rhythm.      Heart sounds: Normal heart sounds.   Pulmonary:      Effort: Pulmonary effort is normal.      Breath sounds: Normal breath sounds.   Abdominal:      General: Bowel sounds are normal.      Palpations: Abdomen is soft.   Musculoskeletal:      Comments: Patient is quadriplegic chronically at C6 level    Skin:     Comments: Patient declined skin assessment, indicated son is sick and will to see him   Neurological:      General: No focal deficit present.      Mental Status: She is alert and oriented to person, place, and time.   Psychiatric:         Mood and Affect: Mood normal.         Behavior: Behavior normal.         Thought Content: Thought content normal.         Judgment: Judgment normal.        Vitals:    02/08/19 1129   Pulse: 68   Temp: 36 C (96.8 F)   TempSrc: Temporal   Weight: 73.5 kg (162 lb)   Height: 1.651 m (5\' 5" )     Wt Readings from Last 3 Encounters:   02/08/19 73.5 kg (162 lb)   01/04/19 73.9 kg (163 lb)   11/29/18 72.6 kg (160 lb)     BP Readings from Last 3 Encounters:   01/31/19 150/83   01/04/19 110/80   11/29/18 101/63       RESULTS:     Lab Results   Component Value Date    WBC 6.7  12/01/2018    HGB 13.9 12/01/2018    HCT 43 12/01/2018    MCV 95 12/01/2018    PLT 98 (L) 12/01/2018     Lab Results   Component Value Date    NA 147 (H) 12/01/2018    K 4.2 12/01/2018    CL 109 (H) 12/01/2018    CO2 24 12/01/2018    UN 14 12/01/2018     Lab Results   Component Value Date    ALT CANCELED 08/10/2018    AST CANCELED 08/10/2018    GGT 35 11/19/2009     Lab Results   Component Value Date    CREAT 0.42 (L) 01/23/2019       ASSESSMENT/PLAN:     1. Quadriparesis At C6  2. Partial oculomotor nerve palsy, unspecified laterality  - Home health air paperwork completed and gave to Mickel Baas     3. Constipation,  chronic  - Patient is having regular BM   - Refilled bisacodyl (BISAC-EVAC) 10 MG suppository; Place 1 suppository (10 mg total) rectally daily as needed  Dispense: 50 suppository; Refill: 3  - Refilled docusate sodium (COLACE) 100 MG capsule; Take 1 capsule (100 mg total) by mouth 2 times daily  Dispense: 60 capsule; Refill: 5    4. Depression, unspecified depression type  - Feeling better but son is sick, here in hospital, denies SI/HI  - Patient's mother is accompanying pt to the visit, patient run out of her medication recently  - Refilled Mirtazapine (REMERON) 7.5 MG tablet; Take 1 tablet (7.5 mg total) by mouth nightly  Dispense: 30 tablet; Refill: [place], NP-C 02/08/19 12:01 PM   [facility]   Ambulatory Service       Patient instructions

## 2019-02-13 ENCOUNTER — Ambulatory Visit: Payer: MEDICAID | Attending: Orthopedic Surgery

## 2019-03-03 ENCOUNTER — Encounter: Payer: Self-pay | Admitting: Dermatology

## 2019-03-03 ENCOUNTER — Ambulatory Visit: Payer: MEDICAID | Attending: Dermatology | Admitting: Dermatology

## 2019-03-03 ENCOUNTER — Other Ambulatory Visit: Payer: Self-pay | Admitting: Dermatology

## 2019-03-03 VITALS — Ht 65.0 in | Wt 160.0 lb

## 2019-03-03 DIAGNOSIS — R238 Other skin changes: Secondary | ICD-10-CM | POA: Insufficient documentation

## 2019-03-03 MED ORDER — MUPIROCIN 2 % EX OINT *I*
TOPICAL_OINTMENT | Freq: Three times a day (TID) | CUTANEOUS | 2 refills | Status: DC
Start: 2019-03-03 — End: 2019-05-05

## 2019-03-03 NOTE — Patient Instructions (Addendum)
1. Blistering- unknown cause  -Start the use of Mupirocin as needed to blisters that appear  -If there is a severe flare up, contact us
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
## 2019-03-06 ENCOUNTER — Telehealth: Payer: Self-pay

## 2019-03-06 NOTE — Telephone Encounter (Signed)
Copied from [place]. Topic: Information Request - Other  >> Mar 06, 2019 11:25 AM Ivette Loyal wrote:  Cindy Ochoa is calling, patient just saw Dr. Wallis Bamberg for an NPV three days ago. She's now requesting another urgent appointment but doesn't want to go to [place] or [facility]. States they're too far. Patient states the area behind her ear is draining a lot and she did take pictures as Dr. Wallis Bamberg advised. She'd like a call at [phone] to schedule for [facility].

## 2019-03-07 NOTE — Telephone Encounter (Signed)
Patient was called and LM to schedule an appointment. Will again later.

## 2019-03-10 ENCOUNTER — Encounter: Payer: MEDICAID | Admitting: Surgery

## 2019-03-10 NOTE — Telephone Encounter (Signed)
LVM regarding scheduling. Second attempt, closing encounter.

## 2019-03-10 NOTE — Telephone Encounter (Signed)
Spoke to the patient and she let me know that on Saturday she noticed a new blister behind her ear. She put the cream on it and it drained and then refilled. She is unsure if the cream is helping. She has taken pictures to show at her appointment this week. If she becomes more concerned she will stop the cream until her appointment.

## 2019-03-10 NOTE — Telephone Encounter (Signed)
Patient returned call and was scheduled next Thursday at [facility] with [place]. Patient stated that since using the cream prescribed at her last appointment she has been getting blisters behind her ear and does not know if it is the medication causing this.

## 2019-03-13 ENCOUNTER — Telehealth: Payer: Self-pay | Admitting: Dermatology

## 2019-03-13 NOTE — Telephone Encounter (Signed)
Called patient and relayed the following: In cooperation with central administration, dermatology is deferring all non-essential care to minimize spread of the COVID-19 virus.    Patient notes: She understands, and will await next call in the future/    Added patient to [place]

## 2019-03-16 ENCOUNTER — Encounter: Payer: Self-pay | Admitting: Orthopedic Surgery

## 2019-03-16 ENCOUNTER — Encounter: Payer: Self-pay | Admitting: Physical Medicine and Rehabilitation

## 2019-03-16 ENCOUNTER — Ambulatory Visit: Payer: MEDICAID | Admitting: Dermatology

## 2019-03-20 ENCOUNTER — Telehealth: Payer: Self-pay | Admitting: Internal Medicine

## 2019-03-20 ENCOUNTER — Encounter: Payer: Self-pay | Admitting: Dermatology

## 2019-03-20 ENCOUNTER — Encounter: Payer: Self-pay | Admitting: Internal Medicine

## 2019-03-20 MED ORDER — CEFADROXIL 500 MG PO CAPS *I*
500.0000 mg | ORAL_CAPSULE | Freq: Two times a day (BID) | ORAL | 0 refills | Status: AC
Start: 2019-03-20 — End: 2019-03-27

## 2019-03-20 NOTE — Telephone Encounter (Signed)
Attempted to contact pt.  No answer.  VM left, call back number provided.    Of note, Probation officer has been messaging with pt on MyChart and pt did send dermatology pictures of area behind her ear.

## 2019-03-20 NOTE — Telephone Encounter (Signed)
Copied from [place] [phone] [id number]. Topic: Access to Care - Speak to Provider/Office Staff  >> Mar 20, 2019 10:59 AM Lexine Baton wrote:  [name], patients mother, called requesting to speak with provider     She states that patient has a blister behind her left ear  She informed that the blister drains daily but does not decrease in size.    She states that patient now has a knot on back of neck on left side and neck is swollen  Caller informed that patient is a quadraplegic and googled her symptoms and is afraid it may be an infection     Caller would like to discuss with provider as soon as possible  She would like office to contact patient at [phone]

## 2019-03-20 NOTE — Telephone Encounter (Signed)
Sent to Dr.Somers

## 2019-03-20 NOTE — Telephone Encounter (Signed)
Derm prescribed antibiotic for pt.

## 2019-03-21 ENCOUNTER — Encounter: Payer: Self-pay | Admitting: Dermatology

## 2019-04-03 ENCOUNTER — Telehealth: Payer: Self-pay | Admitting: Student in an Organized Health Care Education/Training Program

## 2019-04-03 NOTE — Telephone Encounter (Signed)
Called patient again but was unable to reach. Did not leave voicemail. If she calls back, can offer telehealth visit if she would like one and discuss if she needs any refills at this time.

## 2019-04-03 NOTE — Telephone Encounter (Signed)
Tried calling patient to check in and see how she was doing. Phone went directly to voicemail.

## 2019-04-10 ENCOUNTER — Ambulatory Visit: Payer: MEDICAID | Admitting: Physical Medicine and Rehabilitation

## 2019-04-14 ENCOUNTER — Telehealth: Payer: Self-pay

## 2019-04-14 NOTE — Telephone Encounter (Signed)
Called patient and relayed the following: In cooperation with central administration, dermatology is deferring all non-essential care to minimize spread of the COVID-19 virus.    Patient notes: LM offering TELEHOME/ZOOM.     Added patient to [facility] Reduction Excel Spreadsheet

## 2019-04-21 ENCOUNTER — Encounter: Payer: Self-pay | Admitting: Urology

## 2019-04-24 NOTE — Telephone Encounter (Signed)
Called patient, left VM to call back

## 2019-04-25 ENCOUNTER — Other Ambulatory Visit
Admission: RE | Admit: 2019-04-25 | Discharge: 2019-04-25 | Disposition: A | Discharge status: Home / Self Care | Payer: MEDICAID | Source: Ambulatory Visit | Attending: Infectious Diseases | Admitting: Infectious Diseases

## 2019-04-25 DIAGNOSIS — N390 Urinary tract infection, site not specified: Secondary | ICD-10-CM | POA: Insufficient documentation

## 2019-04-25 LAB — URINALYSIS REFLEX TO CULTURE
Ketones, UA: NEGATIVE
Nitrite,UA: POSITIVE — AB
Protein,UA: 30 mg/dL — AB
Specific Gravity,UA: 1.013 (ref 1.002–1.030)
pH,UA: 6 (ref 5.0–8.0)

## 2019-04-25 LAB — URINE MICROSCOPIC (IQ200)
RBC,UA: 20 /hpf — ABNORMAL HIGH (ref 0–2)
WBC,UA: 61 /hpf — AB (ref 0–5)

## 2019-04-25 NOTE — Telephone Encounter (Signed)
Nursing, please reach out to patient to see how she is doing. I haven't had any luck via phone or mychart. Thanks!

## 2019-04-26 ENCOUNTER — Telehealth: Payer: Self-pay

## 2019-04-26 ENCOUNTER — Ambulatory Visit: Payer: MEDICAID | Admitting: Dermatology

## 2019-04-26 LAB — AEROBIC CULTURE

## 2019-04-26 NOTE — Telephone Encounter (Signed)
Attempted to call Ralphine to clarify order. Line was busy. Will call again.

## 2019-04-26 NOTE — Telephone Encounter (Signed)
LM for the patient     The patient wants to be seen in person for a Rash, Blistering behing ear.  Please schedule an in person with any provider.

## 2019-04-26 NOTE — Telephone Encounter (Signed)
Ralphine called requesting clarity on the urine order for this patient. She is not sure if it is a special collection or not. She can be reached at [phone].

## 2019-04-26 NOTE — Telephone Encounter (Signed)
Copied from [place] [phone]. Topic: Appointments - Cancel Appointment  >> Apr 26, 2019  9:47 AM Babs Bertin wrote:  Patient cancelled her appointment for 4/29 because she wants to be seen in the office .

## 2019-04-26 NOTE — Telephone Encounter (Signed)
Left patient a message to see if she was available for a telehome or televideo with Dr. Sharyne Richters today at 1 pm.

## 2019-04-28 ENCOUNTER — Telehealth: Payer: Self-pay | Admitting: Infectious Diseases

## 2019-04-28 LAB — AEROBIC CULTURE

## 2019-04-28 MED ORDER — SULFAMETHOXAZOLE-TRIMETHOPRIM 400-80 MG PO TABS *I*
1.0000 | ORAL_TABLET | Freq: Two times a day (BID) | ORAL | 0 refills | Status: DC
Start: 2019-04-28 — End: 2019-06-03

## 2019-04-28 NOTE — Telephone Encounter (Signed)
Called to discuss urine culture results with patient.    She had fever to 101 for couple of days, last week, and then her urine was starting to get cloudy and malodorous.  She is also with increased muscle spasms.    She sent off a UA and UCx.  These symptoms persist, though no fever.        Lab results: 04/25/19  1305   Appearance,UR 1+ Cloudy*   Color, UA Lt Yellow   Glucose,UA NORM   Ketones, UA NEG   Specific Gravity,UA 1.013   Blood,UA 2+*   pH,UA 6.0   Protein,UA 30*   Nitrite,UA POS*   Leuk Esterase,UA 2+*   RBC,UA 20*   WBC,UA 61*   Mucus,UA Present     4/28 urostomy UCx is with > 100K E coli.    Treat with Bactrim SS tablets, 1 twice a day for 5 days.  Script sent.

## 2019-05-02 NOTE — Telephone Encounter (Signed)
The patient has been scheduled

## 2019-05-05 ENCOUNTER — Ambulatory Visit: Payer: MEDICAID | Attending: Dermatology | Admitting: Dermatology

## 2019-05-05 ENCOUNTER — Other Ambulatory Visit: Payer: Self-pay | Admitting: Oncology

## 2019-05-05 VITALS — Temp 96.0°F | Ht 65.0 in | Wt 146.0 lb

## 2019-05-05 DIAGNOSIS — L819 Disorder of pigmentation, unspecified: Secondary | ICD-10-CM | POA: Insufficient documentation

## 2019-05-05 DIAGNOSIS — L0103 Bullous impetigo: Secondary | ICD-10-CM

## 2019-05-05 MED ORDER — MUPIROCIN 2 % EX OINT *I*
TOPICAL_OINTMENT | Freq: Three times a day (TID) | CUTANEOUS | 2 refills | Status: DC
Start: 2019-05-05 — End: 2019-05-05

## 2019-05-05 MED ORDER — CLINDAMYCIN PHOSPHATE 1 % EX SOLN *I*
Freq: Two times a day (BID) | CUTANEOUS | 5 refills | Status: DC
Start: 2019-05-05 — End: 2019-05-05
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
## 2019-05-05 NOTE — Patient Instructions (Addendum)
-   We expect the patches on your knees to slowly re-pigment.  Start Mupirocin:  - Using QTip, apply pea sized amount three times a day for 10 days  - If a new blister appears, please apply clindamycin solution  - hold on patch testing for now given higher likelihood for infection    Blistering. Recurrent but no new episodes since completing duricef at the end of March 2020.  Likely bullous impetigo.

## 2019-05-15 ENCOUNTER — Encounter: Payer: Self-pay | Admitting: Physical Medicine and Rehabilitation

## 2019-05-23 ENCOUNTER — Ambulatory Visit: Payer: MEDICAID | Admitting: Physical Medicine and Rehabilitation

## 2019-05-31 ENCOUNTER — Ambulatory Visit: Payer: MEDICAID | Attending: Physical Medicine and Rehabilitation | Admitting: Physical Medicine and Rehabilitation

## 2019-05-31 ENCOUNTER — Encounter: Payer: Self-pay | Admitting: Physical Medicine and Rehabilitation

## 2019-05-31 VITALS — BP 121/57 | Temp 86.5°F

## 2019-05-31 DIAGNOSIS — G8253 Quadriplegia, C5-C7 complete: Secondary | ICD-10-CM | POA: Insufficient documentation

## 2019-05-31 MED ORDER — ONABOTULINUMTOXINA 100 UNIT IJ SOLR *I*
600.0000 [IU] | Freq: Once | INTRAMUSCULAR | Status: AC
Start: 2019-05-31 — End: 2019-05-31
  Administered 2019-05-31: 600 [IU] via INTRAMUSCULAR

## 2019-05-31 NOTE — Progress Notes (Signed)
Botulinum Toxin Injection Procedure Note     Pre-operative Diagnosis: C6 complete tetraplegia    Indications: Spasticity that adversely impacts function     Procedure Details   The risks, benefits, indications, potential complications, and alternatives were explained to the patient and/or guardian who verbalized understanding and informed consent obtained.     The area for injection was identified and a time out called to re-identify.      The correct patient was identified with 2 identifiers The correct procedure, location site(s) and laterality were identified with the patient and/or guardian and correspond to the consent form  The appropriate site was marked and verified.  The patient was placed in the correct position.    After prepping the skin with alcohol overlying the following muscles, botlinum toxin was injected intramuscularly.     R quadriceps (divided into 4 sites): 300 units  L quadriceps (divided into 4 sites): 300 units    Botulinum toxin Lot #: [id number]  Botulinum toxin expiration date: 10/2021  Total botox units injected: 600 units   Total botox units wasted: 0 units     The patient tolerated the procedure without complications.    Follow up 12 weeks for repeat injection    Alena Bills, MD

## 2019-06-02 ENCOUNTER — Inpatient Hospital Stay
Admission: EM | Admit: 2019-06-02 | Discharge: 2019-06-03 | DRG: 720 | Disposition: A | Discharge status: Home / Self Care | Payer: MEDICAID | Source: Ambulatory Visit | Attending: Internal Medicine | Admitting: Internal Medicine

## 2019-06-02 ENCOUNTER — Encounter: Payer: Self-pay | Admitting: Student in an Organized Health Care Education/Training Program

## 2019-06-02 ENCOUNTER — Emergency Department: Payer: MEDICAID

## 2019-06-02 ENCOUNTER — Encounter: Payer: MEDICAID | Admitting: Cardiology

## 2019-06-02 ENCOUNTER — Other Ambulatory Visit: Payer: Self-pay | Admitting: Cardiology

## 2019-06-02 DIAGNOSIS — R911 Solitary pulmonary nodule: Secondary | ICD-10-CM

## 2019-06-02 DIAGNOSIS — F17210 Nicotine dependence, cigarettes, uncomplicated: Secondary | ICD-10-CM | POA: Diagnosis present

## 2019-06-02 DIAGNOSIS — Z87440 Personal history of urinary (tract) infections: Secondary | ICD-10-CM

## 2019-06-02 DIAGNOSIS — J9811 Atelectasis: Secondary | ICD-10-CM

## 2019-06-02 DIAGNOSIS — N3000 Acute cystitis without hematuria: Secondary | ICD-10-CM | POA: Diagnosis present

## 2019-06-02 DIAGNOSIS — N1330 Unspecified hydronephrosis: Secondary | ICD-10-CM | POA: Diagnosis present

## 2019-06-02 DIAGNOSIS — R9431 Abnormal electrocardiogram [ECG] [EKG]: Secondary | ICD-10-CM

## 2019-06-02 DIAGNOSIS — N319 Neuromuscular dysfunction of bladder, unspecified: Secondary | ICD-10-CM | POA: Diagnosis present

## 2019-06-02 DIAGNOSIS — G8250 Quadriplegia, unspecified: Secondary | ICD-10-CM | POA: Diagnosis present

## 2019-06-02 DIAGNOSIS — R000 Tachycardia, unspecified: Secondary | ICD-10-CM

## 2019-06-02 DIAGNOSIS — E872 Acidosis: Secondary | ICD-10-CM | POA: Diagnosis present

## 2019-06-02 DIAGNOSIS — I10 Essential (primary) hypertension: Secondary | ICD-10-CM | POA: Diagnosis present

## 2019-06-02 DIAGNOSIS — Z87820 Personal history of traumatic brain injury: Secondary | ICD-10-CM

## 2019-06-02 DIAGNOSIS — F329 Major depressive disorder, single episode, unspecified: Secondary | ICD-10-CM | POA: Diagnosis present

## 2019-06-02 DIAGNOSIS — Z936 Other artificial openings of urinary tract status: Secondary | ICD-10-CM

## 2019-06-02 DIAGNOSIS — N10 Acute tubulo-interstitial nephritis: Secondary | ICD-10-CM

## 2019-06-02 DIAGNOSIS — K592 Neurogenic bowel, not elsewhere classified: Secondary | ICD-10-CM | POA: Diagnosis present

## 2019-06-02 DIAGNOSIS — K5900 Constipation, unspecified: Secondary | ICD-10-CM | POA: Diagnosis present

## 2019-06-02 DIAGNOSIS — A419 Sepsis, unspecified organism: Principal | ICD-10-CM | POA: Diagnosis present

## 2019-06-02 DIAGNOSIS — Z20828 Contact with and (suspected) exposure to other viral communicable diseases: Secondary | ICD-10-CM | POA: Diagnosis present

## 2019-06-02 DIAGNOSIS — R509 Fever, unspecified: Secondary | ICD-10-CM | POA: Diagnosis present

## 2019-06-02 DIAGNOSIS — R0682 Tachypnea, not elsewhere classified: Secondary | ICD-10-CM

## 2019-06-02 DIAGNOSIS — M791 Myalgia, unspecified site: Secondary | ICD-10-CM

## 2019-06-02 DIAGNOSIS — N390 Urinary tract infection, site not specified: Secondary | ICD-10-CM

## 2019-06-02 LAB — CALCIUM: Calcium: 9.5 mg/dL (ref 8.8–10.2)

## 2019-06-02 LAB — CBC AND DIFFERENTIAL
Baso # K/uL: 0 10*3/uL (ref 0.0–0.1)
Basophil %: 0.3 %
Eos # K/uL: 0 10*3/uL (ref 0.0–0.4)
Eosinophil %: 0.3 %
Hematocrit: 44 % (ref 34–45)
Hemoglobin: 14.2 g/dL (ref 11.2–15.7)
IMM Granulocytes #: 0 10*3/uL (ref 0.0–0.0)
IMM Granulocytes: 0.3 %
Lymph # K/uL: 1.6 10*3/uL (ref 1.2–3.7)
Lymphocyte %: 19.8 %
MCH: 31 pg/cell (ref 26–32)
MCHC: 33 g/dL (ref 32–36)
MCV: 96 fL — ABNORMAL HIGH (ref 79–95)
Mono # K/uL: 0.4 10*3/uL (ref 0.2–0.9)
Monocyte %: 4.4 %
Neut # K/uL: 5.9 10*3/uL (ref 1.6–6.1)
Nucl RBC # K/uL: 0 10*3/uL (ref 0.0–0.0)
Nucl RBC %: 0 /100 WBC (ref 0.0–0.2)
RBC: 4.6 MIL/uL (ref 3.9–5.2)
RDW: 15.1 % — ABNORMAL HIGH (ref 11.7–14.4)
Seg Neut %: 74.9 %
WBC: 7.9 10*3/uL (ref 4.0–10.0)

## 2019-06-02 LAB — RUQ PANEL (ED ONLY)
Albumin: 4.7 g/dL (ref 3.5–5.2)
Alk Phos: 69 U/L (ref 35–105)
Amylase: 55 U/L (ref 28–100)
Bilirubin,Direct: <0.2 mg/dL (ref 0.0–0.3)
Bilirubin,Total: 0.3 mg/dL (ref 0.0–1.2)
Lipase: 49 U/L (ref 13–60)
Total Protein: 8.3 g/dL — ABNORMAL HIGH (ref 6.3–7.7)

## 2019-06-02 LAB — PLASMA PROF 7 (ED ONLY)
Anion Gap,PL: 17 — ABNORMAL HIGH (ref 7–16)
CO2,Plasma: 18 mmol/L — ABNORMAL LOW (ref 20–28)
Chloride,Plasma: 103 mmol/L (ref 96–108)
Creatinine: 0.53 mg/dL (ref 0.51–0.95)
GFR,Black: 143 *
GFR,Caucasian: 124 *
Glucose,Plasma: 139 mg/dL — ABNORMAL HIGH (ref 60–99)
Sodium,Plasma: 138 mmol/L (ref 133–145)
UN,Plasma: 17 mg/dL (ref 6–20)

## 2019-06-02 LAB — URINE MICROSCOPIC (IQ200)
RBC,UA: 3 /hpf — AB (ref 0–2)
WBC,UA: 31 /hpf — ABNORMAL HIGH (ref 0–5)

## 2019-06-02 LAB — URINALYSIS REFLEX TO CULTURE
Ketones, UA: NEGATIVE
Nitrite,UA: NEGATIVE
Protein,UA: 30 mg/dL — AB
Specific Gravity,UA: 1.005 (ref 1.002–1.030)
pH,UA: 7 (ref 5.0–8.0)

## 2019-06-02 LAB — COVID-19 PCR: COVID-19 PCR: 0

## 2019-06-02 LAB — ALT: ALT: 14 U/L (ref 0–35)

## 2019-06-02 LAB — AST, PLASMA: AST, PL: 19 U/L (ref 0–35)

## 2019-06-02 LAB — AST

## 2019-06-02 LAB — HOLD GREEN WITH GEL

## 2019-06-02 LAB — POTASSIUM: Potassium: 3.9 mmol/L (ref 3.3–5.1)

## 2019-06-02 LAB — LACTATE, PLASMA: Lactate: 0.8 mmol/L (ref 0.5–2.2)

## 2019-06-02 MED ORDER — ACETAMINOPHEN 500 MG PO TABS *I*
1000.0000 mg | ORAL_TABLET | Freq: Once | ORAL | Status: AC
Start: 2019-06-02 — End: 2019-06-02
  Administered 2019-06-02: 1000 mg via ORAL
  Filled 2019-06-02: qty 2

## 2019-06-02 MED ORDER — SODIUM CHLORIDE 0.9 % IV BOLUS *I*
1000.0000 mL | Freq: Once | Status: AC
Start: 2019-06-02 — End: 2019-06-02
  Administered 2019-06-02: 1000 mL via INTRAVENOUS

## 2019-06-02 MED ORDER — CEFTRIAXONE SODIUM 1 G IN STERILE WATER 10ML SYRINGE *I*
1000.0000 mg | Freq: Once | INTRAVENOUS | Status: AC
Start: 2019-06-02 — End: 2019-06-02
  Administered 2019-06-02: 1000 mg via INTRAVENOUS
  Filled 2019-06-02: qty 10

## 2019-06-02 MED ORDER — BISACODYL 10 MG RE SUPP *I*
10.0000 mg | RECTAL | Status: DC
Start: 2019-06-02 — End: 2019-06-03
  Administered 2019-06-02: 10 mg via RECTAL
  Filled 2019-06-02: qty 1

## 2019-06-02 MED ORDER — KETOROLAC TROMETHAMINE 30 MG/ML IJ SOLN *I*
15.0000 mg | Freq: Once | INTRAMUSCULAR | Status: AC
Start: 2019-06-02 — End: 2019-06-02
  Administered 2019-06-02: 15 mg via INTRAVENOUS
  Filled 2019-06-02: qty 1

## 2019-06-02 MED ORDER — DEXTROSE 5 % FLUSH FOR PUMPS *I*
0.0000 mL/h | INTRAVENOUS | Status: DC | PRN
Start: 2019-06-02 — End: 2019-06-03

## 2019-06-02 MED ORDER — STERILE WATER FOR IRRIGATION IR SOLN *I*
900.0000 mL | Freq: Once | Status: AC
Start: 2019-06-02 — End: 2019-06-02
  Administered 2019-06-02: 900 mL via ORAL

## 2019-06-02 MED ORDER — ACETAMINOPHEN 500 MG PO TABS *I*
1000.0000 mg | ORAL_TABLET | Freq: Three times a day (TID) | ORAL | Status: DC | PRN
Start: 2019-06-02 — End: 2019-06-03

## 2019-06-02 MED ORDER — NICOTINE POLACRILEX 2 MG MT LOZG *I*
2.0000 mg | LOZENGE | OROMUCOSAL | Status: DC | PRN
Start: 2019-06-02 — End: 2019-06-03

## 2019-06-02 MED ORDER — IOHEXOL 350 MG/ML (OMNIPAQUE) IV SOLN *I*
1.0000 mL | Freq: Once | INTRAVENOUS | Status: AC
Start: 2019-06-02 — End: 2019-06-02
  Administered 2019-06-02: 70 mL via INTRAVENOUS

## 2019-06-02 MED ORDER — MELATONIN 3 MG PO TABS *I*
3.0000 mg | ORAL_TABLET | Freq: Every evening | ORAL | Status: DC | PRN
Start: 2019-06-02 — End: 2019-06-03
  Administered 2019-06-02: 3 mg via ORAL
  Filled 2019-06-02: qty 1

## 2019-06-02 MED ORDER — SODIUM CHLORIDE 0.9 % FLUSH FOR PUMPS *I*
0.0000 mL/h | INTRAVENOUS | Status: DC | PRN
Start: 2019-06-02 — End: 2019-06-03

## 2019-06-02 MED ORDER — POLYETHYLENE GLYCOL 3350 PO PACK 17 GM *I*
17.0000 g | PACK | Freq: Every day | ORAL | Status: DC
Start: 2019-06-02 — End: 2019-06-03
  Administered 2019-06-02: 17 g via ORAL
  Filled 2019-06-02 (×2): qty 17

## 2019-06-02 MED ORDER — CEFTRIAXONE SODIUM 1 G IN STERILE WATER 10ML SYRINGE *I*
1000.0000 mg | INTRAVENOUS | Status: DC
Start: 2019-06-03 — End: 2019-06-03
  Administered 2019-06-03: 1000 mg via INTRAVENOUS
  Filled 2019-06-02: qty 1000

## 2019-06-02 MED ORDER — ENOXAPARIN SODIUM 40 MG/0.4ML IJ SOSY *I*
40.0000 mg | PREFILLED_SYRINGE | Freq: Every day | INTRAMUSCULAR | Status: DC
Start: 2019-06-02 — End: 2019-06-02

## 2019-06-02 NOTE — ED Notes (Signed)
Received report from Alyssa, RN. Assuming pt care, pt visualized to be in NAD, will continue to monitor and treat per MD orders.

## 2019-06-02 NOTE — ED Notes (Signed)
Pt arrives via EMS for c/o spasms. Upon arrival pt was tachy and diaphoretic. Oral temp taken and pt was 103.49F. Provider notified. Pt states the spasms were in her legs and awoke her around 0200. Pt states she feels SOB and had palpatations. Tele and SPO2 monitor applied. Denies dizziness, CP, body aches, weakness. Pt uses a motorized wheelchair at baseline. A/Ox4

## 2019-06-02 NOTE — ED Notes (Signed)
Hospital bed ordered for pt.

## 2019-06-02 NOTE — ED Notes (Signed)
Report Given To  Alyssa RN      Descriptive Sentence / Reason for Admission   C/O spasms in lower extremities, started around 0200. Arrival to Probation officer, tachy and diaphoretic. Oral temp of 103F. C/O SOB and palpitations as well. Denies all other symptoms      Active Issues / Relevant Events   -Tele & SPO2 monitor   - Febrile (103F: tylenol given, 72F now)  - tachy 120's : 2 fluid bolus   - A/O x4   - Wheelchair at baseline  - Covid Negative  - Urostomy      To Do List  - Vs / assess Q4  - Meds per [facility]  - Monitor temp       Anticipatory Guidance / Discharge Planning  Pending

## 2019-06-02 NOTE — ED Triage Notes (Signed)
States muscle spasms over entire body. +Anxiety. States she have a UTI. States spams have been lasting for 1.5 hours. States this happened 6 months ago and diagnosed with UTI.        Triage Note   Irven Baltimore, RN
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
## 2019-06-02 NOTE — ED Notes (Signed)
Bed: AC-46R  Expected date:   Expected time:   Means of arrival:   Comments:  24L

## 2019-06-02 NOTE — ED Procedure Documentation (Addendum)
Procedures   Ultrasound - Renal/Bladder  Performed by: Aura Dials, MD  Authorized by: Jamison Oka, MD     Renal Exam    Procedure Details:     Indications comment:  Fever, tachycardia    Assessment For: hydronephrosis    Structures:     Right Kidney: visualized    Left Kidney: visualized    Exam Limitations: none    Findings:     Right Kidney: mild hydronephrosis      Left Kidney: moderate hydronephrosis       Impression:     right kidney: right kidney hydronephrosis      left kidney: left kidney hydronephrosis      Images were interpreted by me and archived to [facility] PACS          Aura Dials, MD     Aura Dials, MD  Resident  06/02/19 1454      Ultrasound Procedure: I was present and participated during the critical and key components of the procedure, and immediately available during the remainder of the procedure.    Images: Images were reviewed and interpreted by me and I agree with the resident interpretation as documented.    Yisroel Ramming, MD as of 06/04/2019 at 12:34 PM     Hildred Mollica, Chriss Czar, MD  06/04/19 1234

## 2019-06-02 NOTE — ED Notes (Signed)
Writer attempted to get EKG. While trying pt started shaking uncontrollably, Pt states this happens at home but is "never this bad". No LOC. Provider notified and at bedside.

## 2019-06-02 NOTE — ED Notes (Signed)
PLAN OF CARE;  Report Given To  46 RN      Descriptive Sentence / Reason for Admission   C/O spasms in lower extremities, started around 0200. Arrival to Probation officer, tachy and diaphoretic. Oral temp of 103F. C/O SOB and palpitations as well. Denies all other symptoms      Active Issues / Relevant Events   -Tele & SPO2 monitor   - Febrile (103F: tylenol given, 25F now)  - tachy 120's : 2 fluid bolus   - A/O x4   - Wheelchair at baseline  - Covid Negative  - Urostomy  -Ct chest- linear atelectasis in left  lung base, 74mm module in right upper lobe  -US showed mod L. Hydronephrosis   -CT abd neg for obstructing stone      To Do List  - Vs / assess Q4  - Meds per [facility]  - Monitor temp       Anticipatory Guidance / Discharge Planning  Admit for pyelonephritis

## 2019-06-02 NOTE — ED Provider Progress Notes (Addendum)
ED Provider Progress Note    Received patient in signout from previous ED provider, in short 33 year old female with history of quadriplegia and neurogenic bladder presenting to emergency department with complaint of muscle spasms as well as episode of palpitations, diaphoresis, malaise.  Noted to be febrile, to, tachycardia upon presentation to emergency department.  Tachycardia improved after receiving IV fluids and Tylenol.  Lab work so far unremarkable, awaiting urinalysis and chest x-ray.    10:04 AM  UA notable for positive leuk esterase, white blood cells, bacteria, however these findings are present on previous urinalysis.  Chest x-ray negative for acute cardiopulmonary disease.  Patient noted to be right during in the emergency department, worsening tachycardia with heart rate 135.  Ordered antibiotics for UTI coverage with ceftriaxone as well as additional liter of IV fluid bolus and Toradol.  CT angiogram chest ordered to evaluate for possibility of pulmonary embolism.    2:55 PM  CT angiogram negative for PE. Bedside renal US showing moderate L hydronephrosis, concerning for infected stone vs pyelonephrosis. CT abd and pelvis ordered, negative for obstructing stone. Admitted to hospital medicine service.             Aura Dials, MD, 06/02/2019, 9:51 AM     Aura Dials, MD  Resident  06/02/19 1616     Bruin Bolger, Chriss Czar, MD  06/04/19 1220

## 2019-06-02 NOTE — ED Notes (Signed)
Pt to CT

## 2019-06-02 NOTE — ED Notes (Addendum)
Pt called writer into room stating "I feel like I am burning up again." Oral temp obtained and found to be 37.2 C. Pt requesting tylenol. Will notify provider.

## 2019-06-02 NOTE — ED Notes (Signed)
ED RN [place], RN (RN) reviewed the following charting information by the RN intern:Cindy Ochoa    Nursing Assessments  Medications  Plan of Care  Teaching   Notes    In the chart of Cindy Ochoa (33 y.o. female) and attest to the charting being accurate.
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
## 2019-06-02 NOTE — ED Notes (Signed)
Report Given To  Erin, RN       Descriptive Sentence / Reason for Admission   C/O spasms in lower extremities, started around 0200. Arrival to Probation officer, tachy and diaphoretic. Oral temp of 103F. C/O SOB and palpitations as well. Denies all other symptoms      Active Issues / Relevant Events   -Tele & SPO2 monitor   - Febrile (103F: tylenol given, 57F now)  - tachy 120's : 2 fluid bolus   - A/O x4   - Wheelchair at baseline  - Covid Negative  - Urostomy  -Ct chest- linear atelectasis in left  lung base, 37mm module in right upper lobe  -US showed mod L. Hydronephrosis   -CT abd neg for obstructing stone      To Do List  - Vs / assess Q4  - Meds per [facility]  - Monitor temp       Anticipatory Guidance / Discharge Planning  Admit for pyelonephritis

## 2019-06-02 NOTE — ED Procedure Documentation (Addendum)
Procedures   Ultrasound - Cardiac  Performed by: Aura Dials, MD  Authorized by: Jamison Oka, MD       Procedure Details:     Indications: abnormal vital signs      Assessment for: LV global contractility, pericardial fluid and RV strain    Structures:     pericardium: visualized      cardiac chambers: visualized      ventricular septum: visualized      thoracic aorta: visualized      Views obtained: parasternal short axis (PSS) and parasternal long axis (PSL)     Exam Limitations:   body habitus and patient discomfort    Findings:     Cardiac activity visualized: yes      Pericardial fluid: no      LV global contractility: hyperdynamic      RV strain: no       Impression:     cardiac activity: cardiac activity present      pericardial fluid: no pericardial fluid      LV contractility: hyperdynamic    RV strain: none       Images were interpreted by me and archived to [employer].          Aura Dials, MD     Aura Dials, MD  Resident  06/02/19 1454      Ultrasound Procedure: I was present and participated during the critical and key components of the procedure, and immediately available during the remainder of the procedure.    Images: Images were reviewed and interpreted by me and I agree with the resident interpretation as documented.    Yisroel Ramming, MD as of 06/04/2019 at 12:20 PM2       Yasaman Kolek, Chriss Czar, MD  06/04/19 1220

## 2019-06-02 NOTE — Progress Notes (Signed)
SW received a call from [place], Bradley Ferris to check with the patient on child care for her son. Writer met with patient at the bedside and patient stated that her son's 14th birthday is tomorrow and that their neighbors and his father will be supervising him while patient is in the hospital. SW remains available as needed.     [name], [place]

## 2019-06-02 NOTE — ED Notes (Signed)
Assumed pt care. Pt appears to be in NAD. Resting comfortably in bed. Bed locked and in lowest position. Call bell in reach. Will continue to monitor and treat per provider's orders.

## 2019-06-03 LAB — BASIC METABOLIC PANEL
Anion Gap: 13 (ref 7–16)
CO2: 21 mmol/L (ref 20–28)
Calcium: 9.1 mg/dL (ref 8.8–10.2)
Chloride: 107 mmol/L (ref 96–108)
Creatinine: 0.44 mg/dL — ABNORMAL LOW (ref 0.51–0.95)
GFR,Black: 152 *
GFR,Caucasian: 132 *
Glucose: 97 mg/dL (ref 60–99)
Lab: 8 mg/dL (ref 6–20)
Potassium: 3.6 mmol/L (ref 3.3–5.1)
Sodium: 141 mmol/L (ref 133–145)

## 2019-06-03 LAB — CBC
Hematocrit: 39 % (ref 34–45)
Hemoglobin: 12.2 g/dL (ref 11.2–15.7)
MCH: 30 pg/cell (ref 26–32)
MCHC: 32 g/dL (ref 32–36)
MCV: 95 fL (ref 79–95)
Platelets: 76 10*3/uL — ABNORMAL LOW (ref 160–370)
RBC: 4.1 MIL/uL (ref 3.9–5.2)
RDW: 14.6 % — ABNORMAL HIGH (ref 11.7–14.4)
WBC: 4.8 10*3/uL (ref 4.0–10.0)

## 2019-06-03 LAB — GRAM STAIN

## 2019-06-03 MED ORDER — SULFAMETHOXAZOLE-TRIMETHOPRIM 400-80 MG PO TABS *I*
2.0000 | ORAL_TABLET | Freq: Two times a day (BID) | ORAL | 0 refills | Status: DC
Start: 2019-06-03 — End: 2019-06-03

## 2019-06-03 MED ORDER — SULFAMETHOXAZOLE-TRIMETHOPRIM 800-160 MG PO TABS *I*
1.0000 | ORAL_TABLET | Freq: Two times a day (BID) | ORAL | 0 refills | Status: AC
Start: 2019-06-03 — End: 2019-06-08

## 2019-06-03 NOTE — Discharge Instructions (Signed)
Brief Summary of Your Hospital Course (including key procedures and diagnostic test results):  You came to the hospital with chills and fever. You were found to have a urinary tract infection. You were treated with IV antibiotics and your infection improved. You were discharged home and switched to oral antibiotics.    Your instructions:  - Take the remainder of your antibiotics,   - take your first dose tomorrow (06/04/19) -  Bactrim 800-160mg  (1pill) twice a day   - take for 5 days total, finish all pills     Make an appointment to follow up with your PCP         Changes to existing medications:  none    What to do after you leave the hospital:    Recommended diet: Regular - No restrictions     Recommended activity: activity as tolerated        If you experience any of these symptoms within the first 24 hours after discharge:Uncontrolled pain, Shortness of breath or Fever of 101 F. or greater  please follow up with the discharge attending Dr Melrose Nakayama at phone-number: [phone]    If you experience any of these symptoms 24 hours or more after discharge:Uncontrolled pain, Shortness of breath or Fever of 101 F. or greater  please follow up with your PCP:  Thornell Sartorius, MD [phone]
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
## 2019-06-03 NOTE — Progress Notes (Signed)
AVS completed and reviewed with pt. PIV removed. Medications to be tubed to unit from pharmacy, no copay. Pt states that she has a ride around 2 pm. Delmar Landau, RN
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
## 2019-06-03 NOTE — Plan of Care (Addendum)
[facility] arrived to the unit via stretcher   Ambulation: uses electrical wheelchair at the baseline    Oriented to person/place/time.  x3    Vital Signs: VSS on RA  Baseline O2: No  O2    Pain: Denies    Four eyed skin assessment completed with Jacky Kindle, RN  Skin assessment findings:  healing pressure ulcers on sacrum and coccyx   Wound consulted: No    Access: left forearm  Lines/drains/catheters:  urostomy    Belongings present on admission: smart cell phone, clothing, wallet ( per patient $13 and credit cards) at the bedside.  Last BM 06.06.20    Admission screening complete: Yes    Influenza vaccine Status:  received      Zollie Scale, RN  Pt stated she will be "down" on Saturday because it is her son's birthday and they and not together. Urostomy bag in place, draining. Multiple BM's during shift. Will continue to monitor.Zollie Scale, RN    Problem: Safety  Goal: Patient will remain free of falls  Outcome: Maintaining  Goal: Prevent any intentional injury  Outcome: Maintaining     Problem: Pain/Comfort  Goal: Patient's pain or discomfort is manageable  Outcome: Maintaining     Problem: Nutrition  Goal: Nutritional status is maintained or improved - Geriatric  Outcome: Maintaining     Problem: Mobility  Goal: Functional status is maintained or improved - Geriatric  Outcome: Maintaining     Problem: Psychosocial  Goal: Demonstrates ability to cope with illness  Outcome: Maintaining     Problem: Cognitive function  Goal: Cognitive function will be maintained or return to baseline  Outcome: Maintaining

## 2019-06-04 LAB — AEROBIC CULTURE: Aerobic Culture: 0

## 2019-06-05 ENCOUNTER — Telehealth: Payer: Self-pay | Admitting: Internal Medicine

## 2019-06-05 NOTE — Telephone Encounter (Signed)
Review of AVS:    Date of Discharge:     06/03/19    Reason for admission:  UTI    Discharged to :   Home    On Coumadin or Warfarin:  no    Follow up appointment:  None

## 2019-06-05 NOTE — Progress Notes (Signed)
06/05/19 1039   [facility] Patient Class Review   Patient Class Review Inpatient     Patient class effective as of 06/02/2019    Parthenia Ames RN  Utilization Management  [phone]

## 2019-06-05 NOTE — Telephone Encounter (Signed)
Care Management Transition Care Assessment    Date of Discharge Call:  06/05/2019- phone contact attempted to patient and message left with request to call [facility] office if questions, concerns and to schedule a follow up appt.   330pm- [facility] contact attempted to patient to follow up and message left to call writer back.   06/06/19- call back from patient and information below received.     Reason for admission:  UTI                     Patient Assessment:    Do you have any health concerns since your discharge? :  Doing better and no concerns    Do you have all of your medications?  yes    Do you have any problems taking your medications?- no    Home care services?- no      Are you on any anticoagulation medication?  no    Are you having any problems getting around your house ? Do you need to use an assistive device ?  wheelchair    Do you have any issues that cause you stress ?   Aide service issues - difficult to get aides d/t covid 19 restrictions with obtaining PPD and MMR for aides. Advised that PCP offices are now starting to have more office visits so will ask them to try to get MD appts. States Mother is only aide at this time and is getting tired.       Who are the most important people in your life that provide you support?  mother    Transportation plan:  Own vehicle    Plan for Follow up appointment:  SIM appt scheduled 06/08/19.      Health Home Care Management Eligibility Assessment :    Eligible for health home care management?- yes and requesting referral for health home care manager for additional support, resources and to work on health goals.     Covid 19 Testing?- negative

## 2019-06-05 NOTE — Telephone Encounter (Signed)
Copied from [place] [phone]. Topic: Appointments - Schedule Appointment  >> Jun 05, 2019 12:49 PM Brendia Sacks wrote:  Ms. Pianka is calling to schedule an appointment with Dr. Sander Nephew.     The patient would like to be seen for discharge follow up appointment.    Please call the patient to schedule at Telephone Information:  Mobile          [phone]

## 2019-06-06 LAB — EKG 12-LEAD
P: 45 deg
PR: 172 ms
QRS: 54 deg
QRSD: 85 ms
QT: 400 ms
QTc: 444 ms
Rate: 74 {beats}/min
T: 74 deg

## 2019-06-06 NOTE — Telephone Encounter (Signed)
Patient is eligible for Health Home Care Management. Health home care management program discussed with patient for additional support and resources in the community. Patient agreeable to program and verbal  consent obtained by writer on 06/06/19.             Medicaid number: [id number]  Medicaid Managed Care Organization Name (N/A if none):N/A  [place] of residence: [place]  Phone: [phone]    Please indicate any need for language/interpretation services- N/A      Problem List:   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    Thrombocytopenia D69.6    Health care maintenance Z00.00    Pressure ulcer stage III L89.93    Acne L70.9    Nexplanon insertion Z30.017    Headache, menstrual migraine G43.829    Dry eyes H04.123    Neurogenic bowel K59.2    Osteoporosis M81.0    Nephrolithiasis N20.0    Urolithiasis N20.9    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    Urinary tract infection N39.0    Controlled substance agreement signed Z79.899    Urinary tract infection without hematuria, site unspecified N39.0    UTI (urinary tract infection) N39.0    Abscess L02.91             Risk Factors: X and bold type for all that apply     []   Probable risk for adverse event- death, disability, inpatient or SNF admission       [x]   Lack of or inadequate social /family /housing support     []   Lack of or inadequate connectivity with health care system     []   Non adherence to treatments, appointments, medications or difficulty managing medication     []  Recent release from incarceration     []   Recent release from psychiatric hospitalization     [x]   Deficits in ADLS       []   Learning or cognition issues      Narrative (Provide any additional information that may be helpful in regards to patient needs): interested in additional support, resources. Mother is sole care giver CDPAS aide at this time and trying to get more aides certified. Patient stressed with concern for additional help.       Please forward to [facility] Liaison inbasket pool for further review/assignment.

## 2019-06-08 ENCOUNTER — Ambulatory Visit: Payer: MEDICAID | Attending: Internal Medicine | Admitting: Internal Medicine

## 2019-06-08 ENCOUNTER — Other Ambulatory Visit: Payer: Self-pay | Admitting: Internal Medicine

## 2019-06-08 ENCOUNTER — Encounter: Payer: Self-pay | Admitting: Internal Medicine

## 2019-06-08 VITALS — BP 119/75 | HR 90 | Temp 96.3°F | Ht 65.0 in

## 2019-06-08 DIAGNOSIS — M810 Age-related osteoporosis without current pathological fracture: Secondary | ICD-10-CM | POA: Insufficient documentation

## 2019-06-08 DIAGNOSIS — N319 Neuromuscular dysfunction of bladder, unspecified: Secondary | ICD-10-CM | POA: Insufficient documentation

## 2019-06-08 DIAGNOSIS — R7309 Other abnormal glucose: Secondary | ICD-10-CM | POA: Insufficient documentation

## 2019-06-08 DIAGNOSIS — F32A Depression, unspecified: Secondary | ICD-10-CM

## 2019-06-08 DIAGNOSIS — Z139 Encounter for screening, unspecified: Secondary | ICD-10-CM | POA: Insufficient documentation

## 2019-06-08 DIAGNOSIS — F329 Major depressive disorder, single episode, unspecified: Secondary | ICD-10-CM | POA: Insufficient documentation

## 2019-06-08 DIAGNOSIS — F17200 Nicotine dependence, unspecified, uncomplicated: Secondary | ICD-10-CM | POA: Insufficient documentation

## 2019-06-08 LAB — BLOOD CULTURE
Bacterial Blood Culture: 0
Bacterial Blood Culture: 0

## 2019-06-08 LAB — POCT HEMOGLOBIN A1C: Hemoglobin A1C,POC: 5.5 %

## 2019-06-08 MED ORDER — NICOTINE POLACRILEX 2 MG MT GUM *I*
2.0000 mg | CHEWING_GUM | OROMUCOSAL | 1 refills | Status: DC | PRN
Start: 2019-06-08 — End: 2019-06-08

## 2019-06-08 MED ORDER — ERGOCALCIFEROL 50000 UNIT PO CAPS *I*
50000.0000 [IU] | ORAL_CAPSULE | ORAL | 0 refills | Status: DC
Start: 2019-06-08 — End: 2020-03-19

## 2019-06-08 MED ORDER — NICOTINE 14 MG/24HR TD PT24 *I*
1.0000 | MEDICATED_PATCH | Freq: Every day | TRANSDERMAL | 1 refills | Status: DC
Start: 2019-06-08 — End: 2019-06-08

## 2019-06-08 NOTE — Telephone Encounter (Signed)
Clinical care management: Contacted by Elsie Stain health home liaison with report that patient can not have TBI waiver case manager and Health home care manager. Writer has requested liaison to check with patient regarding choice.   06/08/19- discussion with patient by Probation officer regarding health home care manager vs TBI waivered case manager. Patient states TBI waivered case manager has not been helpful and would like a health home care manager.   Message left for Elsie Stain to advise.

## 2019-06-08 NOTE — Progress Notes (Signed)
Reason For Visit: The primary encounter diagnosis was Osteoporosis. Diagnoses of Neurogenic bladder disorder, Depression, unspecified depression type, Smoking, Encounter for screening involving social determinants of health (SDoH), and Elevated glucose level were also pertinent to this visit.      HPI:  Cindy Ochoa is 32 y.o. year old female with autonomic dysfunction, depression, muscle spasticity, neurogenic bladder, neurogenic bowel, osteoporosis, quadriplegic at C6, frequent urinary tract infections    Patient is here for hospital follow-up for UTI  She presented with fever chills and found to have sepsis of urinary source.  Patient normalized with IV ceftriaxone  She was discharged on Bactrim DS  Blood cultures were negative    Patient reports no fever or chills    CT chest showed 6 x 4 mm nodule right upper lobe  Recommended CT follow-up if high risk  Patient continues to smoke    Currently having issues with her aide service  She is working with changes in the home  Often time aids or not showing up  C6 quadriplegic  Totally dependent on aides service    Smoker 5 to 10/day  Patient would like to quit  She is used patches and gum in the past with good results    Depression  Has counselor from TBI waiver    Patient had 1 elevated glucose in the hospital  A1c today is 5.5 no signs of diabetes  Patient has a strong family history    Osteoporosis  Was seen by orthopedics but unable to get an IV in for treatment  Vitamin D level 18 in January  Currently not on a supplement    Son who is 72 is home from school due to COVID-19    Social : Patient is here with her mother today    Medications:     Current Outpatient Medications   Medication Sig    nicotine (NICODERM CQ) 14 MG/24HR patch Place 1 patch onto the skin daily Remove & discard patch after 24 hours.    nicotine polacrilex (NICORETTE) 2 MG gum Take 1 each (2 mg total) by mouth every 3 hours as needed for Smoking cessation Max daily dose: 16 mg     ergocalciferol (ERGOCALCIFEROL) 50000 UNIT capsule Take 1 capsule (50,000 Units total) by mouth once a week    sulfamethoxazole-trimethoprim (BACTRIM DS) 800-160 MG per tablet Take 1 tablet by mouth 2 times daily for 5 days    mupirocin (BACTROBAN) 2 % ointment Apply topically 3 times daily To nares (nasal passages) for 10 days    clindamycin (CLEOCIN T) 1 % external solution Apply topically 2 times daily    bisacodyl (BISAC-EVAC) 10 MG suppository Place 1 suppository (10 mg total) rectally daily as needed    docusate sodium (COLACE) 100 MG capsule Take 1 capsule (100 mg total) by mouth 2 times daily    disposable underpads 30"x36" (CHUX) Use 6 times daily and PRN. Dx N39.42  Incontinence without sensory awareness    incontinence supply disposable Large pull ups - use up to 5 x daily  Dx N39.46    disposable gloves 1 box Dynarex PF Vinyl Gloves    patient lift Use as directed for patient lifting. For lifetime use; ICD 10: G82.54 Ht: 1.81m Wt: 76.2 kg    adjustable bath/shower seat with back For lifetime use; ICD 10: G82.54 Ht: 1.52m Wt: 76.2 kg    Non-System Medication Urostomy drainage bags 2000 cc change as needed. Dx N39.46 and  G82.54    generic  DME Wafers  Dx code: N31.9  Qty: 10  Refills: 6    generic DME Urostomy Pouches  Dx Codes(s): N31.9  Qty: 20  Refills: 6    generic DME Leg bag  Dx Code: N31.9  Qty: 4  Refills: 6    generic DME Night drainage bag  Dx Code: N31.9  Qty: 4  Refills: 6    generic DME Barrier rings  Dx Code: N31.9  Qty: 15  Refills: 6    Ostomy Supplies [employer] Urostomy two piece bag and wafer 1 1/4 "H  7/8 "V N31.9 Neuromuscular dysfunction of the bladder    Ostomy Supplies [facility] Coloplast Urostomy two piece bag and wafer 1 1/4 "H  7/8 "V N31.9 Neuromuscular dysfunction of the bladder. Use as directed.    Ostomy Supplies Pouch [employer] Urostomy two piece bag and wafer 1 1/4 "H  7/8 "V N31.9 Neuromuscular dysfunction of the bladder Use as directed    Ostomy  Supplies MISC Barrier ring. Use as directed.    generic DME Repairs to hospital bed/hoyer lift  ICD 10: G82.54   Ht: 1.25m Wt: 76.2 kg    polyethylene glycol (GLYCOLAX) powder Take 17 g by mouth daily   Mix in 8 oz water or juice and drink.    senna-docusate (PERICOLACE) 8.6-50 MG per tablet Take 2 tablets by mouth daily as needed for Constipation    disposable gloves 2 boxes Disposable Medium size gloves N39.46    generic DME Urostomy drainage bags 2000 cc change as needed. Dx N39.46 and  G82.54    Gauze Pads & Dressings (ABDOMINAL PAD) 8"X10" PADS By 1 each no specified route daily   Cover buttock wounds 2x daily    Adhesive Tape (MEDIPORE SURGICAL 2"X10YD) TAPE Secure the 2 buttocks  wound dressings 2x day    Wound Dressings (HYDROFERA BLUE 4"X4") PADS Cut and moisten with saline as directed and pack in to buttock wound daily    Non-System Medication Gel overlay mattress for hospital bed - diagnosis G82.54, L89.93, L89.159    acetaminophen (MAPAP) 500 mg tablet Take 1 tablet (500 mg total) by mouth every 4-6 hours as needed   for pain    albuterol HFA 108 (90 Base) MCG/ACT inhaler Inhale 1-2 puffs into the lungs every 6 hours as needed   Shake well before each use.    Non-System Medication Easy Tip Leg Bags 1000mg  - diagnosis G82.53 N39.46    etonogestrel (NEXPLANON) 68 MG IMPL Inject 68 mg into the skin once   Placed 10/20/16    generic DME Use as directed. Hospital bed with Low air loss mattress. Duration of use: 1 year.    Non-System Medication Hospital Bed with Air Mattress - Diagnosis G82.54, L89.93,L89.159  Medicaid [id number]     No current facility-administered medications for this visit.        Medication list reconciled this visit    Allergies:     Allergies   Allergen Reactions    Heparin Other (See Comments)     Thrombocytopenia; HIT    Nitrofurantoin Nausea And Vomiting    Vancomycin Hives     hives 2006 but tolerated Rx in 2010  pt states she had vancomycin in 04/2012.  Feels this is  not true allergy  as she has received it recently - had no reaction        Social history      Social History     Tobacco Use    Smoking status:  Current Every Day Smoker     Packs/day: 0.25     Years: 3.00     Pack years: 0.75     Types: Cigarettes     Start date: 12/09/2011     Last attempt to quit: 07/30/2016     Years since quitting: 2.8    Smokeless tobacco: Former Systems developer     Quit date: 04/29/2005    Tobacco comment: 3 a day   Substance Use Topics    Alcohol use: No       Review of Systems     ROS  12 point review of system was negative except those mentioned in HPI    Physical Exam:     Physical Exam  Constitutional:       Appearance: She is well-developed.      Comments: In motorized wheelchair   Cardiovascular:      Rate and Rhythm: Normal rate and regular rhythm.      Heart sounds: Normal heart sounds.      Comments: No edema   Pulmonary:      Effort: Pulmonary effort is normal. No respiratory distress.      Breath sounds: Normal breath sounds. No wheezing or rales.   Abdominal:      General: Bowel sounds are normal.      Palpations: Abdomen is soft.      Tenderness: There is no abdominal tenderness.      Comments: Urostomy draining clear urine   Skin:     General: Skin is warm and dry.   Neurological:      Mental Status: She is alert and oriented to person, place, and time.              Vitals:    06/08/19 0831   BP: 119/75   BP Location: Left arm   Cuff Size: adult   Pulse: 90   Temp: 35.7 C (96.3 F)   TempSrc: Temporal   Height: 1.651 m (5\' 5" )     Wt Readings from Last 3 Encounters:   06/02/19 64.9 kg (143 lb)   05/05/19 66.2 kg (146 lb)   03/03/19 72.6 kg (160 lb)     BP Readings from Last 3 Encounters:   06/08/19 119/75   06/03/19 (!) 160/92   05/31/19 121/57           ASSESSMENT/PLAN:     Diagnoses and all orders for this visit:    Sepsis/UTI- resolving, complete bactrim     Osteoporosis  -     AMB REFERRAL TO ORTHOPEDIC SURGERY        Start vitamin D 500,000 units once a week        Consider infusion  per Ortho recommendation at the cancer center/infusion to help with IV acess       Depression, unspecified depression type-  Continue with counseling     Smoking  -     nicotine (NICODERM CQ) 14 MG/24HR patch; Place 1 patch onto the skin daily Remove & discard patch after 24 hours.  -     nicotine polacrilex (NICORETTE) 2 MG gum; Take 1 each (2 mg total) by mouth every 3 hours as needed for Smoking cessation Max daily dose: 16 mg    Encounter for screening involving social determinants of health (SDoH)  -     SIM Internal Services (AC5)    Elevated glucose level no diabetes  -     POCT Hemoglobin A1C    Pulmonary nodule-Ct scan  in one year           Lavone Orn,  Nurse Practitioner  [facility] Internal  Medicine

## 2019-06-09 ENCOUNTER — Telehealth: Payer: Self-pay | Admitting: Internal Medicine

## 2019-06-09 NOTE — Telephone Encounter (Signed)
Copied from [place] [phone]. Topic: Return Call - Speak to Provider/Office Staff  >> Jun 09, 2019 10:34 AM Lula Olszewski wrote:  Daphine Deutscher called to leave a message for Cindy Ochoa regarding needing help with paperwork to switch insurance to [place]. Please reach [place] at [phone]

## 2019-06-09 NOTE — Telephone Encounter (Addendum)
Clinical care management: Phone contact attempted to patient and message left with request to call back to writer if needed. Message left that care manager through TBI waiver should be able to assist or patient can call directly to [address] [place] and speak with [employer] rep to apply.     Call back from patient who states she has called [facility] already and needs a letter from the MD/NP stating that patient would benefit from managed long term medicaid including diagnosis , decreased abilities to manage adls and needs aide services to assist. Will need an evaluation for CDPAS aide hours with a possible increase and  care management follow up through  [facility].  Will forward to provider to  Fax to [phone] Attn: [facility] intake coordinator  .

## 2019-06-12 ENCOUNTER — Encounter: Payer: Self-pay | Admitting: Obstetrics and Gynecology

## 2019-06-12 NOTE — Telephone Encounter (Addendum)
She can be scheduled for AGY/Pap in October. Shei s a paraplegic and will need high low bed in tx room

## 2019-06-13 ENCOUNTER — Encounter: Payer: Self-pay | Admitting: Gastroenterology

## 2019-06-16 ENCOUNTER — Other Ambulatory Visit: Payer: Self-pay | Admitting: Student in an Organized Health Care Education/Training Program

## 2019-06-16 DIAGNOSIS — G8250 Quadriplegia, unspecified: Secondary | ICD-10-CM

## 2019-06-16 MED ORDER — GENERIC DME *A*
0 refills | Status: DC
Start: 2019-06-16 — End: 2020-03-21

## 2019-06-20 ENCOUNTER — Other Ambulatory Visit: Payer: Self-pay | Admitting: Orthopedic Surgery

## 2019-06-20 DIAGNOSIS — M810 Age-related osteoporosis without current pathological fracture: Secondary | ICD-10-CM

## 2019-06-22 NOTE — Telephone Encounter (Addendum)
Clinical care management:Phone contact attempted to patient to follow up . Will await call back from patient.   06/29/19- No call back from patient. Message left with request to call writer back to follow up. DOH form for CDPAS /aide service was done on 03/04/19. - will need new form to be completed and sent to [facility] . Appt must be within 1 month of form completion. Form will need to be sent by July 9 , 2020 if plan is to use information gathered at 06/09/19 appt.

## 2019-06-23 NOTE — Telephone Encounter (Signed)
Clinical care management: message received from [place] with request to call back.  06/23/19- Phone contact attempted to health home liaison with request to call writer back.

## 2019-06-26 ENCOUNTER — Encounter: Payer: Self-pay | Admitting: Gastroenterology

## 2019-07-02 ENCOUNTER — Encounter: Payer: Self-pay | Admitting: Internal Medicine

## 2019-07-03 ENCOUNTER — Encounter: Payer: Self-pay | Admitting: Gastroenterology

## 2019-07-05 ENCOUNTER — Ambulatory Visit: Payer: MEDICAID | Admitting: Physical Medicine and Rehabilitation

## 2019-07-12 ENCOUNTER — Encounter: Payer: Self-pay | Admitting: Dermatology

## 2019-07-12 NOTE — Telephone Encounter (Signed)
Patient last seen by Dr. Verne Spurr for suspected bullous impetigo (s/p course of Duricef, at time of visit exam notable for [facility]). Patient now complains of new swelling on her neck, photo provided but unclear. Will plan to have patient be seen in clinic prior to sending antibiotics. Patient notified via mychart and message sent to schedulers.    Advised her that in the meantime she can apply the mupirocin she got at the last appointment if she wants to use anything on the spot.

## 2019-07-13 NOTE — Telephone Encounter (Signed)
LVM regarding appointment date and time. Advised patient to call back and confirm.

## 2019-07-14 ENCOUNTER — Encounter: Payer: Self-pay | Admitting: Dermatology

## 2019-07-14 ENCOUNTER — Other Ambulatory Visit: Payer: Self-pay

## 2019-07-14 ENCOUNTER — Ambulatory Visit: Payer: MEDICAID | Attending: Dermatology | Admitting: Dermatology

## 2019-07-14 VITALS — Temp 97.3°F | Ht 65.0 in | Wt 143.0 lb

## 2019-07-14 DIAGNOSIS — D492 Neoplasm of unspecified behavior of bone, soft tissue, and skin: Secondary | ICD-10-CM | POA: Insufficient documentation

## 2019-07-14 MED ORDER — CEFADROXIL 500 MG PO CAPS *I*
ORAL_CAPSULE | ORAL | 0 refills | Status: DC
Start: 2019-07-14 — End: 2019-09-01
  Filled 2019-07-14: qty 14, 7d supply, fill #0

## 2019-07-14 NOTE — Patient Instructions (Addendum)
Nodule on left neck  -Start Duracef, take 1 tablet twice daily for 7 days  -will order ultrasound of area, call [phone] to schedule appointment

## 2019-07-14 NOTE — Progress Notes (Signed)
Referring Provider: No ref. provider found   PCP: Thornell Sartorius, MD     Chief complaint: Follow-up (bullous impetigo )    Derm History: Bullous impetigo    HPI:   Cindy Ochoa is a pleasant 33 y.o. female here for the concern noted above. Pt here for a bump on her left neck. Has been placing hot compresses on the area. Stated is painful to when pressing on the area. Has been there x ~ 4 days. Reports she has had a similar bump sometime 3-4 months ago. Denied any current blisters.     Smoking History:  reports that she has been smoking cigarettes. She started smoking about 7 years ago. She has a 0.75 pack-year smoking history. She quit smokeless tobacco use about 14 years ago.    ROS:   Integumentary ROS: positive for changing skin lesions  Constitutional: Negative    Physical Exam:    Vitals:    07/14/19 1437   Temp: 36.3 C (97.3 F)   Weight: 64.9 kg (143 lb)   Height: 1.651 m (5\' 5" )      Pain    07/14/19 1437   PainSc:   0 - No pain      General: Appears well, NAD  Orientation: Alert and oriented x3  Mood/Affect: Normal affect  Skin: All of the following were examined, and were within normal limits, except as noted: Neck  --nodule on left neck    Assessment/Plan:     Nodule on left neck  - This is separate and possibly unrelated to the blistering problem discussed at last visit (presumed preceding bullous impetigo and PIH)  - This last happened in March, in the exact same place - Dr. Wallis Bamberg prescribed cefadroxil and it went away completely.  ? Cyst with intermittent inflammation vs lymphadenopathy vs lipoma vs other - patient expressed concern about a neoplasm, advised that an ultrasound will give [place] more information to go on.  [facility], take 1 tablet twice daily for 7 days  -will order ultrasound of area, call [phone] to schedule appointment    Barriers to learning: None    Return to Clinic: as needed    I Antionette Char, LPN, am scribing for and in the presence of Dr. Azzie Roup    I, Dr. Dory Larsen, MD, personally performed the services described in this documentation, as scribed by Antionette Char in my presence, and it is accurate and complete.  07/17/2019  1:32 PM

## 2019-07-18 ENCOUNTER — Ambulatory Visit: Payer: MEDICAID

## 2019-07-18 ENCOUNTER — Encounter: Payer: Self-pay | Admitting: Orthopedic Surgery

## 2019-07-18 ENCOUNTER — Telehealth: Payer: Self-pay | Admitting: Student in an Organized Health Care Education/Training Program

## 2019-07-18 ENCOUNTER — Other Ambulatory Visit: Payer: Self-pay | Admitting: Internal Medicine

## 2019-07-18 NOTE — Telephone Encounter (Signed)
Called patient around 11:30am because received paperwork about new tilted shower chair. Could not find documentation in chair on the need for new equipment and was interested in learning more about the function of the current one she has to better fill out the paperwork I received. Phone went to voicemail so left message.     Ezequiel Ganser, MD  Internal Medicine PGY-2

## 2019-07-18 NOTE — Telephone Encounter (Signed)
Copied from [place] [phone]. Topic: Medications/Prescriptions - Refill Request  >> Jul 18, 2019  3:25 PM Kerby Nora wrote:  Cindy Ochoa calling to request prescription(s) for her medical supplies that [facility] informed her needed new prescriptions.  Writer asked for specifics and patient could only supply "pretty much everything" .    Is patient out of the medication? Yes    Patient can be reached if necessary at [phone].

## 2019-07-18 NOTE — Progress Notes (Signed)
According to a Derm note from 7/17pt was started on an antibiotic for a "bump on her neck". Attempted to check with pt to ensure that she doesn't have any signs of acute infections, which would warrant her not getting the Reclast infusion today. I was unable to reach pt and call went straight to voicemail. Left a message for her to call the clinic back.

## 2019-07-19 ENCOUNTER — Telehealth: Payer: Self-pay | Admitting: Internal Medicine

## 2019-07-19 ENCOUNTER — Other Ambulatory Visit: Payer: Self-pay | Admitting: Student in an Organized Health Care Education/Training Program

## 2019-07-19 MED ORDER — NON-SYSTEM MEDICATION *A*
5 refills | Status: DC
Start: 2019-07-19 — End: 2020-04-26

## 2019-07-19 MED ORDER — NON-SYSTEM MEDICATION *A*
5 refills | Status: DC
Start: 2019-07-19 — End: 2019-07-19

## 2019-07-19 MED ORDER — NON-SYSTEM MEDICATION *A*
5 refills | Status: DC
Start: 2019-07-19 — End: 2020-11-14

## 2019-07-19 MED ORDER — NON-SYSTEM MEDICATION *A*
5 refills | Status: AC
Start: 2019-07-19 — End: ?

## 2019-07-19 NOTE — Telephone Encounter (Signed)
Called patient to learn about the condition of her tilt shower chair as received a letter with interest in recommendations on reasons for a new chair. Learned that the water while showering has broken down the wheels of her chair so that they no longer lock. She also has foot pedals that have been breaking. She says she has been working with [employer] for a cushioned seat and screws that will not rust with water.    [employer] and learned that the letter sent to [place] was also sent to the physical therapist, who is working on creating a letter to get the chair approved.     Ezequiel Ganser, MD  Internal Medicine PGY-2

## 2019-07-19 NOTE — Telephone Encounter (Signed)
Writer attempted to reach patient  Patient did not answer  VM left for patient to call back  Call back number provided.    Once pt calls back, pleas ask for a lis of supply's as pt needs to be more specific. Providers need to know that to proscribe.

## 2019-07-19 NOTE — Telephone Encounter (Signed)
Copied from [place] [phone]. Topic: Return Call - Speak to Provider/Office Staff  >> Jul 19, 2019  8:56 AM Tilden Dome wrote:  Patient returning call to Dr. Sander Nephew from yesterday.  Note in regards to requesting for new medical equipment: tilting shower chair.  Patient is requesting a return call [phone]

## 2019-08-04 ENCOUNTER — Encounter: Payer: Self-pay | Admitting: Physical Medicine and Rehabilitation

## 2019-08-07 ENCOUNTER — Encounter: Payer: Self-pay | Admitting: Dermatology

## 2019-08-07 ENCOUNTER — Encounter: Payer: Self-pay | Admitting: Internal Medicine

## 2019-08-07 DIAGNOSIS — L720 Epidermal cyst: Secondary | ICD-10-CM

## 2019-08-08 ENCOUNTER — Telehealth: Payer: Self-pay

## 2019-08-08 ENCOUNTER — Ambulatory Visit
Admission: RE | Admit: 2019-08-08 | Discharge: 2019-08-08 | Disposition: A | Discharge status: Home / Self Care | Payer: MEDICAID | Source: Ambulatory Visit

## 2019-08-08 DIAGNOSIS — D492 Neoplasm of unspecified behavior of bone, soft tissue, and skin: Secondary | ICD-10-CM

## 2019-08-08 NOTE — Telephone Encounter (Signed)
Copied from [place] [phone]. Topic: Appointments - Schedule Appointment  >> Aug 08, 2019  2:54 PM Evlyn Clines wrote:  The patient is requesting to speak with Dr. Azzie Roup. Patient stated that this is regarding her message she sent over via her mychart account. Patient indicated she does not want to keep taking antibiotics. Patient wants a biopsy done.      Please call the patient back at [phone] to discuss.  Marland Kitchen

## 2019-08-15 NOTE — Telephone Encounter (Signed)
Sent mychart message

## 2019-08-29 NOTE — Telephone Encounter (Signed)
Patient returned call and rescheduled to Friday 10/2 at 1pm with a specialty room attached.

## 2019-08-30 ENCOUNTER — Encounter: Payer: Self-pay | Admitting: Physical Medicine and Rehabilitation

## 2019-08-30 ENCOUNTER — Ambulatory Visit: Payer: MEDICAID | Attending: Physical Medicine and Rehabilitation | Admitting: Physical Medicine and Rehabilitation

## 2019-08-30 VITALS — BP 103/65 | HR 104 | Temp 97.0°F | Wt 162.0 lb

## 2019-08-30 DIAGNOSIS — R252 Cramp and spasm: Secondary | ICD-10-CM | POA: Insufficient documentation

## 2019-08-30 DIAGNOSIS — G8253 Quadriplegia, C5-C7 complete: Secondary | ICD-10-CM | POA: Insufficient documentation

## 2019-08-30 MED ORDER — ONABOTULINUMTOXINA 100 UNIT IJ SOLR *I*
300.0000 [IU] | Freq: Once | INTRAMUSCULAR | Status: AC
Start: 2019-08-30 — End: 2019-08-30
  Administered 2019-08-30: 300 [IU] via INTRAMUSCULAR

## 2019-08-30 NOTE — Progress Notes (Signed)
Botulinum Toxin Injection Procedure Note     Pre-operative Diagnosis: C6 complete tetraplegia    Indications: Spasticity that adversely impacts function     Interval hx:  Patient reports botox injections have been very helpful for spasticity and pain in bilateral legs    Procedure Details   The risks, benefits, indications, potential complications, and alternatives were explained to the patient and/or guardian who verbalized understanding and informed consent obtained.     The area for injection was identified and a time out called to re-identify.      The correct patient was identified with 2 identifiers The correct procedure, location site(s) and laterality were identified with the patient and/or guardian and correspond to the consent form  The appropriate site was marked and verified.  The patient was placed in the correct position.    After prepping the skin with alcohol overlying the following muscles, botlinum toxin was injected intramuscularly.     R quadriceps (divided into 4 sites): 300 units  L quadriceps (divided into 4 sites): 300 units    Botulinum toxin Lot #: [id number]  Botulinum toxin expiration date: 01/2022  Total botox units injected: 600 units   Total botox units wasted: 0 units     The patient tolerated the procedure without complications.    Follow up 12 weeks for repeat injection    Alena Bills, MD

## 2019-09-01 ENCOUNTER — Other Ambulatory Visit: Payer: Self-pay

## 2019-09-01 ENCOUNTER — Ambulatory Visit: Payer: MEDICAID | Attending: Internal Medicine | Admitting: Internal Medicine

## 2019-09-01 ENCOUNTER — Encounter: Payer: Self-pay | Admitting: Internal Medicine

## 2019-09-01 VITALS — BP 112/68 | HR 96 | Temp 95.4°F

## 2019-09-01 DIAGNOSIS — K5909 Other constipation: Secondary | ICD-10-CM | POA: Insufficient documentation

## 2019-09-01 DIAGNOSIS — G8250 Quadriplegia, unspecified: Secondary | ICD-10-CM | POA: Insufficient documentation

## 2019-09-01 MED ORDER — NICOTINE POLACRILEX 2 MG MT GUM *I*
CHEWING_GUM | OROMUCOSAL | 1 refills | Status: DC
Start: 2019-09-01 — End: 2020-03-19
  Filled 2019-09-01: qty 110, 13d supply, fill #0

## 2019-09-01 MED ORDER — BISACODYL 10 MG RE SUPP *I*
10.0000 mg | Freq: Every day | RECTAL | 3 refills | Status: DC | PRN
Start: 2019-09-01 — End: 2020-03-26
  Filled 2019-09-01: qty 50, 50d supply, fill #0

## 2019-09-01 NOTE — Progress Notes (Signed)
Reason For Visit: The primary encounter diagnosis was Quadriparesis. A diagnosis of Constipation, chronic was also pertinent to this visit.      HPI:  Cindy Ochoa is 33 y.o. year old female with quadriplegic autonomic dysfunction, chronic constipation neurogenic bladder with suprapubic catheter    Patient is here she has wound on her left knee  Reports no injury  Is superficial without drainage  She has no fever or chills    Autonomic dysfunction getting Botox shots with significant improvement.    Neurogenic bowel needs refill on suppositories    Patient has not had health maintenance labs in 2 years    Continues to smoke but is cutting back nicotine gum is helpful    Medications:     Current Outpatient Medications   Medication Sig    nicotine polacrilex (NICORETTE) 2 MG gum TAKE 1 EACH (2 MG TOTAL) BY MOUTH EVERY 3 HOURS AS NEEDED FOR SMOKING CESSATION MAX DAILY DOSE: 8 PIECES    bisacodyl (BISAC-EVAC) 10 MG suppository Place 1 suppository (10 mg total) rectally daily as needed    Non-System Medication Briefs/pullups  Ht: 5\' 5"  Wt: 143lbs  ICD code: N39.41    Non-System Medication Gloves  Ht: 5\' 5"  Wt: 143lbs  ICD code: N39.41    Non-System Medication Chux pads  Ht: 5\' 5"  Wt: 143lbs  ICD code: N39.41    generic DME Sig: Doctor, general practice Chair w/ accessories   Patient Height: 1.651 m (5\' 5" )   Weight: 64.9 kg (143 lb)  ICD 10: G82.50    ergocalciferol (ERGOCALCIFEROL) 50000 UNIT capsule Take 1 capsule (50,000 Units total) by mouth once a week    clindamycin (CLEOCIN T) 1 % external solution APPLY TOPICALLY 2 TIMES DAILY    mupirocin (BACTROBAN) 2 % ointment APPLY TOPICALLY 3 TIMES DAILY TO NARES (NASAL PASSAGES) FOR 10 DAYS    docusate sodium (COLACE) 100 MG capsule Take 1 capsule (100 mg total) by mouth 2 times daily    disposable underpads 30"x36" (CHUX) Use 6 times daily and PRN. Dx N39.42  Incontinence without sensory awareness    incontinence supply disposable Large pull ups - use up to 5 x daily  Dx  N39.46    disposable gloves 1 box Dynarex PF Vinyl Gloves    patient lift Use as directed for patient lifting. For lifetime use; ICD 10: G82.54 Ht: 1.63m Wt: 76.2 kg    adjustable bath/shower seat with back For lifetime use; ICD 10: G82.54 Ht: 1.65m Wt: 76.2 kg    Non-System Medication Urostomy drainage bags 2000 cc change as needed. Dx N39.46 and  G82.54    generic DME Wafers  Dx code: N31.9  Qty: 10  Refills: 6    generic DME Urostomy Pouches  Dx Codes(s): N31.9  Qty: 20  Refills: 6    generic DME Leg bag  Dx Code: N31.9  Qty: 4  Refills: 6    generic DME Night drainage bag  Dx Code: N31.9  Qty: 4  Refills: 6    generic DME Barrier rings  Dx Code: N31.9  Qty: 15  Refills: 6    Ostomy Supplies [employer] Urostomy two piece bag and wafer 1 1/4 "H  7/8 "V N31.9 Neuromuscular dysfunction of the bladder    Ostomy Supplies [facility] Coloplast Urostomy two piece bag and wafer 1 1/4 "H  7/8 "V N31.9 Neuromuscular dysfunction of the bladder. Use as directed.    Ostomy Supplies Pouch [employer] Urostomy two piece bag and wafer  1 1/4 "H  7/8 "V N31.9 Neuromuscular dysfunction of the bladder Use as directed    Ostomy Supplies MISC Barrier ring. Use as directed.    generic DME Repairs to hospital bed/hoyer lift  ICD 10: G82.54   Ht: 1.21m Wt: 76.2 kg    polyethylene glycol (GLYCOLAX) powder Take 17 g by mouth daily   Mix in 8 oz water or juice and drink.    senna-docusate (PERICOLACE) 8.6-50 MG per tablet Take 2 tablets by mouth daily as needed for Constipation    disposable gloves 2 boxes Disposable Medium size gloves N39.46    generic DME Urostomy drainage bags 2000 cc change as needed. Dx N39.46 and  G82.54    Gauze Pads & Dressings (ABDOMINAL PAD) 8"X10" PADS By 1 each no specified route daily   Cover buttock wounds 2x daily    Adhesive Tape (MEDIPORE SURGICAL 2"X10YD) TAPE Secure the 2 buttocks  wound dressings 2x day    Wound Dressings (HYDROFERA BLUE 4"X4") PADS Cut and moisten with saline as  directed and pack in to buttock wound daily    Non-System Medication Gel overlay mattress for hospital bed - diagnosis G82.54, L89.93, L89.159    acetaminophen (MAPAP) 500 mg tablet Take 1 tablet (500 mg total) by mouth every 4-6 hours as needed   for pain    albuterol HFA 108 (90 Base) MCG/ACT inhaler Inhale 1-2 puffs into the lungs every 6 hours as needed   Shake well before each use.    Non-System Medication Easy Tip Leg Bags 1000mg  - diagnosis G82.53 N39.46    etonogestrel (NEXPLANON) 68 MG IMPL Inject 68 mg into the skin once   Placed 10/20/16    generic DME Use as directed. Hospital bed with Low air loss mattress. Duration of use: 1 year.    Non-System Medication Hospital Bed with Air Mattress - Diagnosis G82.54, L89.93,L89.159  Medicaid [id number]     No current facility-administered medications for this visit.        Medication list reconciled this visit    Allergies:     Allergies   Allergen Reactions    Heparin Other (See Comments)     Thrombocytopenia; HIT    Nitrofurantoin Nausea And Vomiting    Vancomycin Hives     hives 2006 but tolerated Rx in 2010  pt states she had vancomycin in 04/2012.  Feels this is not true allergy  as she has received it recently - had no reaction        Social history      Social History     Tobacco Use    Smoking status: Former Smoker     Packs/day: 0.25     Years: 3.00     Pack years: 0.75     Types: Cigarettes     Start date: 12/09/2011     Last attempt to quit: 07/30/2016     Years since quitting: 3.0    Smokeless tobacco: Former Systems developer     Quit date: 04/29/2005    Tobacco comment: 3 a day   Substance Use Topics    Alcohol use: No       Review of Systems     ROS  12 point review of system was negative except those mentioned in HPI    Physical Exam:     Physical Exam  Constitutional:       Appearance: She is well-developed.   Cardiovascular:      Rate and Rhythm: Normal rate and regular  rhythm.      Heart sounds: Normal heart sounds.      Comments: No edema    Pulmonary:      Effort: Pulmonary effort is normal. No respiratory distress.      Breath sounds: Normal breath sounds. No wheezing or rales.   Abdominal:      General: Bowel sounds are normal.      Palpations: Abdomen is soft.      Tenderness: There is no abdominal tenderness.   Skin:     General: Skin is warm and dry.          Neurological:      Mental Status: She is alert and oriented to person, place, and time.              Vitals:    09/01/19 1423   BP: 112/68   Pulse: 96   Temp: 35.2 C (95.4 F)   TempSrc: Temporal     Wt Readings from Last 3 Encounters:   08/30/19 73.5 kg (162 lb)   07/14/19 64.9 kg (143 lb)   06/02/19 64.9 kg (143 lb)     BP Readings from Last 3 Encounters:   09/01/19 112/68   08/30/19 103/65   06/08/19 119/75           ASSESSMENT/PLAN:     Diagnoses and all orders for this visit:    Superficial wound  Allevyn to protect the area no signs of infection    Quadriparesis  -     CBC and differential; Future  -     Comprehensive metabolic panel; Future    Constipation, chronic  -     bisacodyl (BISAC-EVAC) 10 MG suppository; Place 1 suppository (10 mg total) rectally daily as needed    Smoker  -     nicotine polacrilex (NICORETTE) 2 MG gum; TAKE 1 EACH (2 MG TOTAL) BY MOUTH EVERY 3 HOURS AS NEEDED FOR SMOKING CESSATION MAX DAILY DOSE: 8 PIECES    Follow-up in 3 to 4 months with CCP            Lavone Orn,  Nurse Practitioner  [facility]

## 2019-09-08 ENCOUNTER — Other Ambulatory Visit
Admission: RE | Admit: 2019-09-08 | Discharge: 2019-09-08 | Disposition: A | Discharge status: Home / Self Care | Payer: MEDICAID | Source: Ambulatory Visit

## 2019-09-08 ENCOUNTER — Other Ambulatory Visit: Payer: Self-pay

## 2019-09-08 DIAGNOSIS — G8250 Quadriplegia, unspecified: Secondary | ICD-10-CM

## 2019-09-08 LAB — CBC AND DIFFERENTIAL
Baso # K/uL: 0 10*3/uL (ref 0.0–0.1)
Basophil %: 0.3 %
Eos # K/uL: 0.1 10*3/uL (ref 0.0–0.4)
Eosinophil %: 0.9 %
Hematocrit: 45 % (ref 34–45)
Hemoglobin: 14.5 g/dL (ref 11.2–15.7)
IMM Granulocytes #: 0 10*3/uL (ref 0.0–0.0)
IMM Granulocytes: 0.2 %
Lymph # K/uL: 2 10*3/uL (ref 1.2–3.7)
Lymphocyte %: 30.6 %
MCH: 31 pg/cell (ref 26–32)
MCHC: 32 g/dL (ref 32–36)
MCV: 95 fL (ref 79–95)
Mono # K/uL: 0.4 10*3/uL (ref 0.2–0.9)
Monocyte %: 5.5 %
Neut # K/uL: 4.1 10*3/uL (ref 1.6–6.1)
Nucl RBC # K/uL: 0 10*3/uL (ref 0.0–0.0)
Nucl RBC %: 0 /100 WBC (ref 0.0–0.2)
Platelets: 99 10*3/uL — ABNORMAL LOW (ref 160–370)
RBC: 4.7 MIL/uL (ref 3.9–5.2)
RDW: 14.2 % (ref 11.7–14.4)
Seg Neut %: 62.5 %
WBC: 6.6 10*3/uL (ref 4.0–10.0)

## 2019-09-08 LAB — COMPREHENSIVE METABOLIC PANEL
ALT: 16 U/L (ref 0–35)
AST: 18 U/L (ref 0–35)
Albumin: 4.6 g/dL (ref 3.5–5.2)
Alk Phos: 67 U/L (ref 35–105)
Anion Gap: 13 (ref 7–16)
Bilirubin,Total: 0.3 mg/dL (ref 0.0–1.2)
CO2: 22 mmol/L (ref 20–28)
Calcium: 10.1 mg/dL (ref 8.8–10.2)
Chloride: 111 mmol/L — ABNORMAL HIGH (ref 96–108)
Creatinine: 0.53 mg/dL (ref 0.51–0.95)
GFR,Black: 143 *
GFR,Caucasian: 124 *
Glucose: 100 mg/dL — ABNORMAL HIGH (ref 60–99)
Lab: 12 mg/dL (ref 6–20)
Potassium: 3.8 mmol/L (ref 3.3–5.1)
Sodium: 146 mmol/L — ABNORMAL HIGH (ref 133–145)
Total Protein: 7.8 g/dL — ABNORMAL HIGH (ref 6.3–7.7)

## 2019-09-17 ENCOUNTER — Other Ambulatory Visit: Payer: Self-pay | Admitting: Internal Medicine

## 2019-09-17 DIAGNOSIS — E870 Hyperosmolality and hypernatremia: Secondary | ICD-10-CM

## 2019-09-17 NOTE — Progress Notes (Signed)
Repeat labs:

## 2019-09-18 ENCOUNTER — Telehealth: Payer: Self-pay

## 2019-09-18 NOTE — Telephone Encounter (Signed)
-----   Message from Sherlon Handing, NP sent at 09/17/2019  9:17 AM EDT -----  Please call  Sodium and chloride a little high  Mostly like due to autonomic dysfunction and sweating  Have her go and repeat labs  Remind her of gyn appointment  She can do the labs at that time  [facility]

## 2019-09-18 NOTE — Telephone Encounter (Signed)
Attempted to contact patient via phone- no answer.  Left voicemail requesting return call or check MyChart messages; callback number provided.    Sent MyChart message to patient with results/recommendations below per Lavone Orn, NP.    Sign encounter once patient reads MyChart message or returns call.

## 2019-09-21 ENCOUNTER — Other Ambulatory Visit
Admission: RE | Admit: 2019-09-21 | Discharge: 2019-09-21 | Disposition: A | Discharge status: Home / Self Care | Payer: MEDICAID | Source: Ambulatory Visit | Attending: Internal Medicine | Admitting: Internal Medicine

## 2019-09-21 DIAGNOSIS — E870 Hyperosmolality and hypernatremia: Secondary | ICD-10-CM | POA: Insufficient documentation

## 2019-09-21 LAB — BASIC METABOLIC PANEL
Anion Gap: 14 (ref 7–16)
CO2: 22 mmol/L (ref 20–28)
Calcium: 9.9 mg/dL (ref 8.8–10.2)
Chloride: 109 mmol/L — ABNORMAL HIGH (ref 96–108)
Creatinine: 0.45 mg/dL — ABNORMAL LOW (ref 0.51–0.95)
GFR,Black: 151 *
GFR,Caucasian: 131 *
Glucose: 89 mg/dL (ref 60–99)
Lab: 11 mg/dL (ref 6–20)
Potassium: 4.2 mmol/L (ref 3.3–5.1)
Sodium: 145 mmol/L (ref 133–145)

## 2019-09-22 ENCOUNTER — Encounter: Payer: Self-pay | Admitting: Internal Medicine

## 2019-09-28 ENCOUNTER — Ambulatory Visit: Payer: MEDICAID | Admitting: Obstetrics and Gynecology

## 2019-09-29 ENCOUNTER — Ambulatory Visit: Payer: MEDICAID | Admitting: Obstetrics and Gynecology

## 2019-09-30 ENCOUNTER — Encounter: Payer: Self-pay | Admitting: Student in an Organized Health Care Education/Training Program

## 2019-11-02 NOTE — Telephone Encounter (Signed)
Patient returning missed call and writer was able to reschedule appointment for 12/27/19 with Np Reynold Bowen, R.

## 2019-11-13 ENCOUNTER — Encounter: Payer: Self-pay | Admitting: Internal Medicine

## 2019-11-13 NOTE — Telephone Encounter (Signed)
This patient attachment is clinically relevant.  Please keep in the patient's chart.    []  Document  []  Photo    Brief attachment description: wound - L leg   (Ex. L forearm rash, WC papers)    Thank you,  Benjaman Pott, RN

## 2019-11-14 ENCOUNTER — Encounter: Payer: Self-pay | Admitting: Internal Medicine

## 2019-11-14 ENCOUNTER — Telehealth: Payer: Self-pay

## 2019-11-14 ENCOUNTER — Other Ambulatory Visit: Payer: Self-pay | Admitting: Dermatology

## 2019-11-14 ENCOUNTER — Ambulatory Visit: Payer: MEDICAID | Attending: Internal Medicine | Admitting: Internal Medicine

## 2019-11-14 VITALS — HR 80 | Temp 97.0°F

## 2019-11-14 DIAGNOSIS — S81809A Unspecified open wound, unspecified lower leg, initial encounter: Secondary | ICD-10-CM | POA: Insufficient documentation

## 2019-11-14 DIAGNOSIS — S91009A Unspecified open wound, unspecified ankle, initial encounter: Secondary | ICD-10-CM | POA: Insufficient documentation

## 2019-11-14 DIAGNOSIS — S81009A Unspecified open wound, unspecified knee, initial encounter: Secondary | ICD-10-CM | POA: Insufficient documentation

## 2019-11-14 DIAGNOSIS — L989 Disorder of the skin and subcutaneous tissue, unspecified: Secondary | ICD-10-CM

## 2019-11-14 NOTE — Telephone Encounter (Signed)
We currently do not have a hoyer lift and that is being worked on to obtain one. In the mean time is this something maybe wound care or plastics could see or is this something that we can do with out a hoyer and patient just staying in the chair?

## 2019-11-14 NOTE — NoShare Progress Note (Signed)
Specialty Response     Dermatology E-Consult    Question  Sore on knee.    History  Received From Referring Provider    Photographs  Reviewed in Media Tab    Quality  Good    Please visit the following website to see an excellent and concise guide for taking medical photographs:  [email or web address]    Assessment  The left knee suggests vitiligo  The ulcerative lesion on the left knee warrants an urgent visit in dermatology as the differential is broad and includes inflammatory skin diseases such as pyoderma gangrenosum    Triage  Recommend Urgent Referral (1-2 weeks)    Plan  Sooner than next available with dermatology    Time Spent: 5 minutes    This eConsult is focused on the specific clinical question(s) asked by the referring clinician, is based on the clinical data available to me, the consulting physician at the time off the request, and is furnished without benefit of a comprehensive evaluation of physical examination of the patient by me. The guidance set forth in the eConsult note will need to be interpreted in light of any clinical issues not known to me or any changes in patient status that I may not be aware of at the time of filing this eConsult. If further consultation is necessary, an in-person visit with me or another member of our group is an option.    Burnard Leigh, MD  Dermatology Attending

## 2019-11-14 NOTE — Progress Notes (Signed)
Reason For Visit: There were no encounter diagnoses.      HPI:  Cindy Ochoa is 33 y.o. year old female with thrombocytopenia, autonomic dysfunction, paraplegia, neurogenic bladder, neurogenic bowel    Patient is here today with rash  Started approximately September  Initially Loss in coloration of the skin   Now ulceration  She does not feel any pain   She reports no injury or burn.     She does get botox injections in her thighs      Medications:     Current Outpatient Medications   Medication Sig    nicotine polacrilex (NICORETTE) 2 MG gum TAKE 1 EACH (2 MG TOTAL) BY MOUTH EVERY 3 HOURS AS NEEDED FOR SMOKING CESSATION MAX DAILY DOSE: 8 PIECES    bisacodyl (BISAC-EVAC) 10 MG suppository Place 1 suppository (10 mg total) rectally daily as needed    Non-System Medication Briefs/pullups  Ht: 5\' 5"  Wt: 143lbs  ICD code: N39.41    Non-System Medication Gloves  Ht: 5\' 5"  Wt: 143lbs  ICD code: N39.41    Non-System Medication Chux pads  Ht: 5\' 5"  Wt: 143lbs  ICD code: N39.41    generic DME Sig: Doctor, general practice Chair w/ accessories   Patient Height: 1.651 m (5\' 5" )   Weight: 64.9 kg (143 lb)  ICD 10: G82.50    ergocalciferol (ERGOCALCIFEROL) 50000 UNIT capsule Take 1 capsule (50,000 Units total) by mouth once a week    clindamycin (CLEOCIN T) 1 % external solution APPLY TOPICALLY 2 TIMES DAILY    mupirocin (BACTROBAN) 2 % ointment APPLY TOPICALLY 3 TIMES DAILY TO NARES (NASAL PASSAGES) FOR 10 DAYS    docusate sodium (COLACE) 100 MG capsule Take 1 capsule (100 mg total) by mouth 2 times daily    disposable underpads 30"x36" (CHUX) Use 6 times daily and PRN. Dx N39.42  Incontinence without sensory awareness    incontinence supply disposable Large pull ups - use up to 5 x daily  Dx N39.46    disposable gloves 1 box Dynarex PF Vinyl Gloves    patient lift Use as directed for patient lifting. For lifetime use; ICD 10: G82.54 Ht: 1.106m Wt: 76.2 kg    adjustable bath/shower seat with back For lifetime use; ICD 10:  G82.54 Ht: 1.28m Wt: 76.2 kg    Non-System Medication Urostomy drainage bags 2000 cc change as needed. Dx N39.46 and  G82.54    generic DME Wafers  Dx code: N31.9  Qty: 10  Refills: 6    generic DME Urostomy Pouches  Dx Codes(s): N31.9  Qty: 20  Refills: 6    generic DME Leg bag  Dx Code: N31.9  Qty: 4  Refills: 6    generic DME Night drainage bag  Dx Code: N31.9  Qty: 4  Refills: 6    generic DME Barrier rings  Dx Code: N31.9  Qty: 15  Refills: 6    Ostomy Supplies [employer] Urostomy two piece bag and wafer 1 1/4 "H  7/8 "V N31.9 Neuromuscular dysfunction of the bladder    Ostomy Supplies [facility] Coloplast Urostomy two piece bag and wafer 1 1/4 "H  7/8 "V N31.9 Neuromuscular dysfunction of the bladder. Use as directed.    Ostomy Supplies Pouch [employer] Urostomy two piece bag and wafer 1 1/4 "H  7/8 "V N31.9 Neuromuscular dysfunction of the bladder Use as directed    Ostomy Supplies MISC Barrier ring. Use as directed.    generic DME Repairs to hospital bed/hoyer lift  ICD 10: G82.54   Ht: 1.58m Wt: 76.2 kg    polyethylene glycol (GLYCOLAX) powder Take 17 g by mouth daily   Mix in 8 oz water or juice and drink.    senna-docusate (PERICOLACE) 8.6-50 MG per tablet Take 2 tablets by mouth daily as needed for Constipation    disposable gloves 2 boxes Disposable Medium size gloves N39.46    generic DME Urostomy drainage bags 2000 cc change as needed. Dx N39.46 and  G82.54    Gauze Pads & Dressings (ABDOMINAL PAD) 8"X10" PADS By 1 each no specified route daily   Cover buttock wounds 2x daily    Adhesive Tape (MEDIPORE SURGICAL 2"X10YD) TAPE Secure the 2 buttocks  wound dressings 2x day    Wound Dressings (HYDROFERA BLUE 4"X4") PADS Cut and moisten with saline as directed and pack in to buttock wound daily    Non-System Medication Gel overlay mattress for hospital bed - diagnosis G82.54, L89.93, L89.159    acetaminophen (MAPAP) 500 mg tablet Take 1 tablet (500 mg total) by mouth every 4-6  hours as needed   for pain    albuterol HFA 108 (90 Base) MCG/ACT inhaler Inhale 1-2 puffs into the lungs every 6 hours as needed   Shake well before each use.    Non-System Medication Easy Tip Leg Bags 1000mg  - diagnosis G82.53 N39.46    etonogestrel (NEXPLANON) 68 MG IMPL Inject 68 mg into the skin once   Placed 10/20/16    generic DME Use as directed. Hospital bed with Low air loss mattress. Duration of use: 1 year.    Non-System Medication Hospital Bed with Air Mattress - Diagnosis G82.54, L89.93,L89.159  Medicaid [id number]     No current facility-administered medications for this visit.        Medication list reconciled this visit    Allergies:     Allergies   Allergen Reactions    Heparin Other (See Comments)     Thrombocytopenia; HIT    Nitrofurantoin Nausea And Vomiting    Vancomycin Hives     hives 2006 but tolerated Rx in 2010  pt states she had vancomycin in 04/2012.  Feels this is not true allergy  as she has received it recently - had no reaction        Social history      Social History     Tobacco Use    Smoking status: Former Smoker     Packs/day: 0.25     Years: 3.00     Pack years: 0.75     Types: Cigarettes     Start date: 12/09/2011     Quit date: 07/30/2016     Years since quitting: 3.2    Smokeless tobacco: Former Systems developer     Quit date: 04/29/2005    Tobacco comment: 3 a day   Substance Use Topics    Alcohol use: No       Review of Systems     ROS  12 point review of system was negative except those mentioned in HPI    Physical Exam:     Physical Exam   No warmth or drainage                   Vitals:    11/14/19 1300   Pulse: 80   Temp: 36.1 C (97 F)   TempSrc: Temporal     Wt Readings from Last 3 Encounters:   08/30/19 73.5 kg (162 lb)   07/14/19 64.9  kg (143 lb)   06/02/19 64.9 kg (143 lb)     BP Readings from Last 3 Encounters:   09/01/19 112/68   08/30/19 103/65   06/08/19 119/75           ASSESSMENT/PLAN:     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

## 2019-11-14 NOTE — Telephone Encounter (Signed)
Cindy Ochoa is calling because her providers e-consulted with [place] and our office wanted her to be seen within 1-2 weeks. Patient needs a Patent examiner advised per [place] that we are unable to accommodate. Patient wants to know what else she should do. Please advise at [phone]

## 2019-11-15 ENCOUNTER — Other Ambulatory Visit: Payer: Self-pay | Admitting: Internal Medicine

## 2019-11-15 ENCOUNTER — Telehealth: Payer: Self-pay

## 2019-11-15 NOTE — NoShare Progress Note (Signed)
Primary Care Response

## 2019-11-15 NOTE — Telephone Encounter (Signed)
Copied from [place] [id number]. Topic: Return Call - Speak to Provider/Office Staff  >> Nov 15, 2019 11:20 AM Evlyn Clines wrote:  Cindy Ochoa is returning a call from [facility].   She states this is regarding schedule an appointment.    Urgent Referral in File.  Was the appropriate expectation set with the patient for a time frame to receive a return call from the office? N/A    Patient can be reached at: [phone]    Patient indicated that what ever we have available on a Tuesday Or Friday to schedule her and she will see the information in her MyChart account.          Marland Kitchen

## 2019-11-15 NOTE — Telephone Encounter (Signed)
Patient was called and scheduled.

## 2019-11-21 ENCOUNTER — Encounter: Payer: Self-pay | Admitting: Internal Medicine

## 2019-11-21 NOTE — Telephone Encounter (Signed)
This patient attachment is clinically relevant.  Please keep in the patient's chart.    []  Document  [x]  Photo    Brief attachment description: Photo of pt's leg  (Ex. L forearm rash, WC papers)    Thank you,  Franco Collet, RN

## 2019-11-22 ENCOUNTER — Ambulatory Visit: Payer: MEDICAID | Admitting: Dermatology

## 2019-11-27 ENCOUNTER — Telehealth: Payer: Self-pay | Admitting: Internal Medicine

## 2019-11-27 ENCOUNTER — Encounter: Payer: Self-pay | Admitting: Student in an Organized Health Care Education/Training Program

## 2019-11-27 NOTE — Telephone Encounter (Signed)
Called and spoke with patient:   Pt states that she started with a "head cold" last Tuesday.   Pt denies fever, cough, shortness of breath - reports some nasal congestion which progressed to runny nose and decreased sense of smell.   Pt with no known COVID exposures, but would like to be seen and tested out of caution due to her medical conditions.   Pt feels well enough to wait for appointment tomorrow - though advised to seek care more urgently with development of cough or any breathing concerns.   Pt verbalized understanding; scheduled for tomorrow during time requested by patient.

## 2019-11-27 NOTE — Telephone Encounter (Signed)
Call to patient but no answer.   Left message to call office.   Call back number provided.

## 2019-11-27 NOTE — Telephone Encounter (Signed)
Copied from [place] [id number]. Topic: Return Call - Speak to Provider/Office Staff  >> Nov 27, 2019  2:35 PM Brendia Sacks wrote:  Ms. Brockelman is returning a call from Benjaman Pott, [place] .   She states this is regarding COVID test.    Transferred the call to [facility].

## 2019-11-27 NOTE — Telephone Encounter (Signed)
Cindy Ochoa calling to report that she is experiencing loss of smell and taste. She says last week she had a head cold. She would like to be tested for Covid.          Patient requesting same day appointment? yes  Patient requesting call back? yes    Phone number confirmed at [phone]

## 2019-11-28 ENCOUNTER — Ambulatory Visit: Payer: MEDICAID | Attending: Internal Medicine | Admitting: Internal Medicine

## 2019-11-28 VITALS — HR 97 | Temp 96.8°F

## 2019-11-28 DIAGNOSIS — Z20822 Contact with and (suspected) exposure to covid-19: Secondary | ICD-10-CM

## 2019-11-28 DIAGNOSIS — Z20828 Contact with and (suspected) exposure to other viral communicable diseases: Secondary | ICD-10-CM | POA: Insufficient documentation
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
## 2019-11-28 NOTE — Patient Instructions (Signed)
-   Please return for COVID testing if you become symptomatic  - If you would like to be tested while asymptomatic and without COVID exposure, please schedule testing at the outpatient lab  - Please go to the emergency department if you become short of breath

## 2019-11-29 ENCOUNTER — Ambulatory Visit: Payer: MEDICAID | Admitting: Physical Medicine and Rehabilitation

## 2019-12-01 ENCOUNTER — Encounter: Payer: Self-pay | Admitting: Dermatology

## 2019-12-01 ENCOUNTER — Ambulatory Visit: Payer: MEDICAID | Attending: Dermatology | Admitting: Dermatology

## 2019-12-01 VITALS — Temp 97.5°F | Ht 65.0 in | Wt 140.0 lb

## 2019-12-01 DIAGNOSIS — L989 Disorder of the skin and subcutaneous tissue, unspecified: Secondary | ICD-10-CM | POA: Insufficient documentation

## 2019-12-01 NOTE — Patient Instructions (Addendum)
CARE OF BIOPSY SITE    -Keep the original dressing on and dry for 24 hours.    -Remove the dressing and wash it with soap and water.    -Apply a thin layer of Vaseline/Aquaphor ointment.  -Cover the wound with a band-aid/bandage.    The wound can be cleaned and the dressing changed once a day until healed or stitches are removed. There is 1 suture (stitch) in your left knee. This can be removed after 7-10 days in our office or at home by a caretaker if preferred.    Tylenol or Ibuprofen should relieve any pain you may have.  If you experience bleeding, apply firm, steady pressure with gauze over the area for 15 minutes.  If the bleeding persists, call the [facility] [phone].    **For emergencies after hours or on the weekend, call the [facility] at [phone], and the Dermatology resident on call will contact you.    Follow-up based on pathology results.

## 2019-12-01 NOTE — Progress Notes (Signed)
Referring Provider: Sherlon Handing, NP   PCP: Thornell Sartorius, MD     Chief complaint: New Patient Visit (Skin Erosion)    Derm/Relevent Medical History:   - Autonomic dysfunction  - Muscle spasticity  - Osteoporosis  - Quadriplegic at C6      HPI:   Cindy Ochoa is a pleasant 33 y.o. female here for the concern noted above. Patient presents today, accompanied by her mother, with complaints of skin erosions on her knee caps.   She initially noticed the erosion above her right knee around March of this year, then a similar erosion appeared on her left knee within the same week.   She noticed it began to swell on her right and was draining about 2 weeks ago. The scab came off a couple days ago. She has been keeping her legs elevated.  When she is not in her wheelchair with her legs elevated, she lays in bed with a pillow under her knees.     She gets botox injections for muscle spasms. She was going to get them this week but decided to hold off until after derm evaluation. She also mentions the erosions appeared after she began to get the Botox injections in her legs (above the knees).    Personal hx of skin cancer: no  Family hx of skin cancer: no  Social History: Patient lives at home with her mother.  Smoking History:  reports that she quit smoking about 3 years ago. Her smoking use included cigarettes. She started smoking about 7 years ago. She has a 0.75 pack-year smoking history. She quit smokeless tobacco use about 14 years ago.    ROS:   Integumentary ROS: positive for eruption on knees bilateral  Constitutional: Negative    Physical Exam:    Vitals:    12/01/19 1339   Temp: 36.4 C (97.5 F)   Weight: 63.5 kg (140 lb)   Height: 1.651 m (5\' 5" )      Pain    12/01/19 1339   PainSc:   0 - No pain      General: Appears well, NAD  Orientation: Alert and oriented x3  Mood/Affect: Normal affect  Skin: All of the following were examined, and were within normal limits, except as noted: Extremities (RLE/LLE)  --  Bilateral knees: she has depigmented patches of scar tissue with some follicular re-pigmentation   -- There is a 1cm ulceration within the lesion on the left knee.  -- These are significantly improved from swollen ulcerations seen in her smart phone picture and the e-consult evaluation.      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   -- DDx: Discoid Lupus, Lichen sclerosus et atrophicus.  -- Recommend biopsy to aid in diagnosis.   -- the eruption is significantly improved from eConsult photo and today's findings may represent post inflammatory changes.  I do not suspect a complication of botox injections.      Procedure Note: Punch biopsy  - After informed consent was obtained, including a discussion of the risk for bleeding, infection, incomplete removal, scarring, and recurrence/persistence, the lesional area was prepped with hibiclens prior to infiltration and 1% Lidocaine with epinephrine was used for anesthetic  After waiting several minutes for epinephrine effect and that adequate anesthesia was achieved, the lesion was biopsied  with a 80mm punch.  Hemostasis was obtained.  There were no complications.  The biopsy was placed in formalin and sent for routine histopathological evaluation. Prolene 3-0 sutures were used. Sutures will be removed in 7-10 days. The wound was then dressed with surgi-foam and a bandaid.  Wound care instructions were provided in verbal and written form including a 24-hour contact number in case of emergency. The patient will be notified by one of the clinical staff (upon return to clinic or by phone) regarding the biopsy results and the need for further treatment.     *Due to COVID-19 pandemic, verbal consent only was obtained in accordance with [facility] guidelines. Risks were discussed including infection, bleeding, scar, lidocaine reaction, recurrence. Patient elected to  proceed.     Patient prefers to be notified of results via:  []  Phone  [x]  MyChart        Barriers to learning: None    Return to Clinic: PRN based on pathology results      I Rabia Cav, am scribing for and in the presence of Dr. Nyoka Cowden.  12/01/2019 2:07 PM

## 2019-12-04 ENCOUNTER — Encounter: Payer: Self-pay | Admitting: Dermatology

## 2019-12-08 ENCOUNTER — Ambulatory Visit: Payer: MEDICAID | Attending: Dermatology | Admitting: Dermatology

## 2019-12-08 ENCOUNTER — Other Ambulatory Visit: Payer: Self-pay

## 2019-12-08 ENCOUNTER — Encounter: Payer: Self-pay | Admitting: Dermatology

## 2019-12-08 VITALS — Temp 97.3°F | Ht 65.0 in | Wt 140.0 lb

## 2019-12-08 DIAGNOSIS — L819 Disorder of pigmentation, unspecified: Secondary | ICD-10-CM | POA: Insufficient documentation

## 2019-12-08 MED ORDER — BETAMETHASONE DIPROPIONATE AUG 0.05 % EX OINT *A*
TOPICAL_OINTMENT | Freq: Two times a day (BID) | CUTANEOUS | 1 refills | Status: DC | PRN
Start: 2019-12-08 — End: 2020-03-21
  Filled 2019-12-08: qty 50, 25d supply, fill #0

## 2019-12-08 MED ORDER — TRIAMCINOLONE ACETONIDE 0.1 % EX OINT *I*
TOPICAL_OINTMENT | Freq: Two times a day (BID) | CUTANEOUS | 3 refills | Status: DC
Start: 2019-12-08 — End: 2019-12-08
  Filled 2019-12-08: qty 80, fill #0

## 2019-12-08 NOTE — Patient Instructions (Addendum)
-  Most consistent with lichen sclerosus et atrophicus.   -Start betamethasone diprop augmented 0.05% ointment twice a day to affected areas  -Do not use on face, underarms, or groin   -Side effects: lightening or thinning of the skin, especially if used on areas where you don't have rash

## 2019-12-08 NOTE — Progress Notes (Signed)
Referring Provider: No ref. provider found   PCP: Thornell Sartorius, MD     Chief complaint: Follow-up (S/R)    Derm/Relevent Medical History:   - Autonomic dysfunction  - Muscle spasticity  - Osteoporosis  - Quadriplegic at C6      HPI:   Cindy Ochoa is a pleasant 33 y.o. female here for the concern noted above. Patient presents today, accompanied by her mother, with complaints of skin erosions on her knee caps.   She initially noticed the erosion above her right knee around March of this year, then a similar erosion appeared on her left knee within the same week.   She noticed it began to swell on her right and was draining about 2 weeks ago. The scab came off a couple days ago. She has been keeping her legs elevated.  When she is not in her wheelchair with her legs elevated, she lays in bed with a pillow under her knees.     She gets botox injections for muscle spasms. She was going to get them this week but decided to hold off until after derm evaluation. She also mentions the erosions appeared after she began to get the Botox injections in her legs (above the knees).    Today, 12/11 she is here for biopsy follow up. Spot on the left knee is slightly less swollen but a spot is appearing on the right leg.     Personal hx of skin cancer: no  Family hx of skin cancer: no  Social History: Patient lives at home with her mother.  Smoking History:  reports that she quit smoking about 3 years ago. Her smoking use included cigarettes. She started smoking about 8 years ago. She has a 0.75 pack-year smoking history. She quit smokeless tobacco use about 14 years ago.    ROS:   Integumentary ROS: positive for eruption on knees bilateral  Constitutional: Negative    Physical Exam:    Vitals:    12/08/19 1345   Temp: 36.3 C (97.3 F)   Weight: 63.5 kg (140 lb)   Height: 1.651 m (5\' 5" )      Pain    12/08/19 1345   PainSc:   0 - No pain      General: Appears well, NAD  Orientation: Alert and oriented x3  Mood/Affect: Normal  affect  Skin: All of the following were examined, and were within normal limits, except as noted: Extremities (RLE/LLE)  -- Bilateral knees: she has depigmented patches of scar tissue with some follicular re-pigmentation   -- There is a 1cm ulceration within the lesion on the left knee. 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   -- DDx: Discoid Lupus, Lichen sclerosus et atrophicus.  -- Awaiting biopsy results, suture removed today  -- the eruption is significantly improved from eConsult photo and from prior visit. Unlikely to be a complication of botox injections.    -- Start betamethasone dipropionate ointment twice daily as needed to the itchy, red, and scaly affected areas on the body   -Do not use on face, underarms, or groin   -Side effects: lightening or thinning of the skin, especially if used on areas where you don't have rash       Barriers to learning: None    Return to Clinic: 1 month    Charlestine Night, MD  PGY-2  Dermatology Resident    Seen and examined with Dr. Nyoka Cowden

## 2019-12-11 ENCOUNTER — Telehealth: Payer: Self-pay

## 2019-12-11 ENCOUNTER — Other Ambulatory Visit: Payer: Self-pay

## 2019-12-11 MED ORDER — TRIAMCINOLONE ACETONIDE 0.1 % EX OINT *I*
TOPICAL_OINTMENT | Freq: Two times a day (BID) | CUTANEOUS | 2 refills | Status: AC
Start: 2019-12-11 — End: 2020-05-28
  Filled 2019-12-11: qty 80, 10d supply, fill #0
  Filled 2020-02-15 – 2020-05-18 (×2): qty 80, 10d supply, fill #1

## 2019-12-11 NOTE — Telephone Encounter (Signed)
Patient was called and left detailed message regarding Dr. Rolly Salter message below.

## 2019-12-11 NOTE — Telephone Encounter (Signed)
Patient mother walked into office stated that patient needed a Prior Authorization for betamethasone dipropionate before the pharmacy is willing to refill medication.

## 2019-12-13 ENCOUNTER — Other Ambulatory Visit: Payer: Self-pay

## 2019-12-13 LAB — SURGICAL PATHOLOGY

## 2019-12-18 ENCOUNTER — Encounter: Payer: Self-pay | Admitting: Physical Medicine and Rehabilitation

## 2019-12-18 ENCOUNTER — Ambulatory Visit: Payer: MEDICAID | Admitting: Physical Medicine and Rehabilitation

## 2019-12-18 ENCOUNTER — Encounter: Payer: Self-pay | Admitting: Dermatology

## 2019-12-19 ENCOUNTER — Other Ambulatory Visit: Payer: Self-pay | Admitting: Dermatology

## 2019-12-19 DIAGNOSIS — I776 Arteritis, unspecified: Secondary | ICD-10-CM

## 2019-12-27 ENCOUNTER — Ambulatory Visit: Payer: MEDICAID | Attending: Obstetrics and Gynecology | Admitting: Obstetrics and Gynecology

## 2019-12-27 ENCOUNTER — Encounter: Payer: Self-pay | Admitting: Obstetrics and Gynecology

## 2019-12-27 ENCOUNTER — Other Ambulatory Visit
Admission: RE | Admit: 2019-12-27 | Discharge: 2019-12-27 | Disposition: A | Discharge status: Home / Self Care | Payer: MEDICAID | Source: Ambulatory Visit | Attending: Orthopedic Surgery | Admitting: Orthopedic Surgery

## 2019-12-27 VITALS — BP 102/69 | HR 97 | Ht 65.0 in | Wt 148.0 lb

## 2019-12-27 DIAGNOSIS — M810 Age-related osteoporosis without current pathological fracture: Secondary | ICD-10-CM | POA: Insufficient documentation

## 2019-12-27 DIAGNOSIS — N390 Urinary tract infection, site not specified: Secondary | ICD-10-CM | POA: Insufficient documentation

## 2019-12-27 DIAGNOSIS — I776 Arteritis, unspecified: Secondary | ICD-10-CM | POA: Insufficient documentation

## 2019-12-27 DIAGNOSIS — Z01419 Encounter for gynecological examination (general) (routine) without abnormal findings: Secondary | ICD-10-CM | POA: Insufficient documentation

## 2019-12-27 LAB — COMPREHENSIVE METABOLIC PANEL
ALT: 14 U/L (ref 0–35)
AST: 14 U/L (ref 0–35)
Albumin: 4.3 g/dL (ref 3.5–5.2)
Alk Phos: 62 U/L (ref 35–105)
Anion Gap: 12 (ref 7–16)
Bilirubin,Total: 0.3 mg/dL (ref 0.0–1.2)
CO2: 23 mmol/L (ref 20–28)
Calcium: 9.5 mg/dL (ref 8.8–10.2)
Chloride: 110 mmol/L — ABNORMAL HIGH (ref 96–108)
Creatinine: 0.53 mg/dL (ref 0.51–0.95)
GFR,Black: 143 *
GFR,Caucasian: 124 *
Glucose: 106 mg/dL — ABNORMAL HIGH (ref 60–99)
Lab: 10 mg/dL (ref 6–20)
Potassium: 3.6 mmol/L (ref 3.3–5.1)
Sodium: 145 mmol/L (ref 133–145)
Total Protein: 7.4 g/dL (ref 6.3–7.7)

## 2019-12-27 LAB — CBC AND DIFFERENTIAL
Baso # K/uL: 0 10*3/uL (ref 0.0–0.1)
Basophil %: 0.3 %
Eos # K/uL: 0 10*3/uL (ref 0.0–0.4)
Eosinophil %: 0.6 %
Hematocrit: 43 % (ref 34–45)
Hemoglobin: 13.6 g/dL (ref 11.2–15.7)
IMM Granulocytes #: 0 10*3/uL (ref 0.0–0.0)
IMM Granulocytes: 0.3 %
Lymph # K/uL: 2.5 10*3/uL (ref 1.2–3.7)
Lymphocyte %: 39.4 %
MCH: 30 pg/cell (ref 26–32)
MCHC: 32 g/dL (ref 32–36)
MCV: 95 fL (ref 79–95)
Mono # K/uL: 0.4 10*3/uL (ref 0.2–0.9)
Monocyte %: 5.9 %
Neut # K/uL: 3.4 10*3/uL (ref 1.6–6.1)
Nucl RBC # K/uL: 0 10*3/uL (ref 0.0–0.0)
Nucl RBC %: 0 /100 WBC (ref 0.0–0.2)
Platelets: 96 10*3/uL — ABNORMAL LOW (ref 160–370)
RBC: 4.5 MIL/uL (ref 3.9–5.2)
RDW: 14.4 % (ref 11.7–14.4)
Seg Neut %: 53.5 %
WBC: 6.4 10*3/uL (ref 4.0–10.0)

## 2019-12-27 LAB — SEDIMENTATION RATE, AUTOMATED: Sedimentation Rate: 41 mm/hr — ABNORMAL HIGH (ref 0–20)

## 2019-12-27 LAB — URINALYSIS REFLEX TO CULTURE
Glucose,UA: NEGATIVE mg/dL
Ketones, UA: NEGATIVE
Nitrite,UA: POSITIVE — AB
Specific Gravity,UA: 1.013 (ref 1.002–1.030)
pH,UA: 6.5 (ref 5.0–8.0)

## 2019-12-27 LAB — URINE MICROSCOPIC (IQ200): WBC,UA: >50 /hpf — AB (ref 0–5)

## 2019-12-27 LAB — VITAMIN D: 25-OH Vit Total: 16 ng/mL — ABNORMAL LOW (ref 30–60)

## 2019-12-27 LAB — CRP: CRP: 8 mg/L (ref 0–10)

## 2019-12-27 NOTE — Patient Instructions (Signed)
Healthy Practices for Women of all Ages     In addition to your regular OB/GYN history, physical, cholesterol and diabetes screenings, cervical cancer screen, and other recommended cancer screening tests, we ask that you review this list of healthy practices.  Modifying your daily activities according to these practices may improve your overall health and well-being.  In the event of questions about this list, ask your health care provider.  Additionally, there are specially written pamphlets available, which offer further information.    Diet and Exercise:  - Limit fat and cholesterol; emphasize fruits, grains and vegetables.  - Consume dairy products or use calcium supplementation for adequate calcium intake (1200 mg or more).  - Add folic acid supplementation 0.4 mg (400 micrograms, present in most daily multivitamins) at least two months before considering pregnancy to reduce the risks of birth defects.  - Participate in regular exercise for 30 minutes at least five times a week, and consider weight training.    Injury Prevention:  - Seat/lap belts should be worn while in a moving car.  - Helmet should be used when using motorcycles, bicycles, roller blades and ATVs or skiing.  - Place approved smoke detectors in your house and replace the batteries twice a year.    - Guns and other firearms should be stored unloaded and in a locked area.  Trigger locks should be used as well.  - Consider CPR training for household members.  - Do not Text and Drive.    Dental Health:  - Schedule regular visits to the dentist.  - Floss and brush with fluoride toothpaste daily.    Immunizations:  - A tetanus/diphtheria booster shot (d/T) is recommended every 10 years.  - An MMR vaccine is recommended for non-pregnant women born after [birth year] without proof of immunity of documentation of previous immunization.  Adults who are susceptible to varicella (chicken pox) should be vaccinated.  - Influenza vaccine is indicated yearly for  all patients.  - Pneumococcal pneumonia vaccine is indicated for age 41 and older once in your life.  - Hepatitis A and/or B vaccines are recommended for high-risk individuals.    Substance Abuse:  - Stop smoking; do not use any other tobacco products.  - Avoid alcohol use when driving, boating, swimming or operating other machinery. Avoid excessive use of alcoholic beverages.  - Recreational drug use (marijuana, cocaine, etc.) is dangerous and can be habit-forming.    Sexual Behavior:  - Be sure to use contraception if pregnancy is not desired.  - Regular use of female or female condoms with spermicide helps prevent STDs.  - Consider HIV testing if:  1. You have had more than one sexual partner.  2. You have had any STDs.  3. You have used intravenous drugs.  4. You have a sexual partner with these (the above) risk factors.  5. Your sexual partner has had female homosexual exposure.  6. You received a blood transfusion during 1978-1985.    Breast Health:  - A mammogram should be done once at age 27, then every 1-2 years based on risk factors.  If there is a strong family history of premenopausal breast cancer, you may need to start with mammograms earlier than age 1.    Colon Cancer Surveillance:  - Beginning at age 78, stool occult blood screening should be done annually and/or sigmoidoscopy (scope examination of the colon) every 3-5 years.    Women and Alcohol  For women, alcohol use carries some unique and  special concerns. Most studies on alcohol have been on men, but we now know women may respond differently to alcohol.   Concerns: Liver damage, cancer, menstrual cycle changes, birth defects, fetal alcohol syndrome, intoxication, nutrition and weight management.  All women should avoid all alcohol during pregnancy.  Help for Problem Drinking:   One in every three members of Alcoholics Anonymous (AA) is now a woman.  AA meetings are held regularly and have led the way in demonstrating the effectiveness of  self-help.   Outpatient and Inpatient Treatment are also available.  Ask your health care provider for a referral.   The first step in successful treatment is to recognize that there is a problem and accept help.    Dietary and Supplemental Calcium  It is important that you build and protect your bone mass through a program of regular exercise and proper nutrition with adequate amounts of calcium in your diet. Dairy products are considered to be the most important sources of calcium.    Patient Education   Breast Mass   AMBULATORY CARE:   A breast mass  is a lump or growth in your breast or underarm. A cyst (fluid-filled pocket), an injury, or changes in your breast tissue are the most common causes. A breast mass can also be caused by cancer. You must follow up as directed to find the cause of your breast mass.  Call your doctor if:    Your breast mass returns or grows larger.     You have pain in your breast or underarm.     You have nipple discharge.     You notice other changes to your breasts or nipples, such as dimpling or a rash.     You had an aspiration and you have redness, pain, swelling, or warmth near the aspiration site.     You have a fever.     You have questions or concerns about your condition or care.    Continue to do breast self-exams as directed:  Check your breast for changes in size, shape, or feel of the breast tissue. Check under your arms and all around your breasts. If you have monthly periods, examine your breasts after your period is over. Contact your healthcare provider if you notice any changes in your breasts. If you have questions, ask for more information on how to do a breast self-exam.     Follow up with your doctor as directed:  You may need to return for more tests. Write down your questions so you remember to ask them during your visits.   Copyright [employer] Information is for [employer] use only and may not be sold, redistributed or otherwise used for  commercial purposes. All illustrations and images included in CareNotes are the copyrighted property of A.D.A[employer]M., Inc[employer] or [employer]  The above information is an educational aid only. It is not intended as medical advice for individual conditions or treatments. Talk to your doctor, nurse or pharmacist before following any medical regimen to see if it is safe and effective for you.
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
## 2019-12-28 ENCOUNTER — Telehealth: Payer: Self-pay

## 2019-12-28 ENCOUNTER — Encounter: Payer: Self-pay | Admitting: Dermatology

## 2019-12-28 ENCOUNTER — Ambulatory Visit: Payer: MEDICAID | Admitting: Dermatology

## 2019-12-28 LAB — ANCA SCREEN: ANCA Screen: NEGATIVE

## 2019-12-28 LAB — AEROBIC CULTURE

## 2019-12-28 NOTE — Telephone Encounter (Signed)
Cindy Ochoa is calling to cancel her appointment which is currently scheduled for today.    Reason for the cancellation: not going to make it in time    Has the appointment been cancelled? yes    Has the appointment been rescheduled? no    Date of new appointment?none     Does the patient need a call back to reschedule?no

## 2019-12-28 NOTE — Telephone Encounter (Signed)
Patient would just like a call from [place] to go over her results as she wont be able to make it to the appointment in time today so she cancelled it. Please call her back at [phone]

## 2019-12-29 LAB — N-TELOPEPTIDE, URINE
Creat,Ur: 91 mg/dL
NTX Telopeptide: 22

## 2020-01-01 ENCOUNTER — Telehealth: Payer: Self-pay | Admitting: Student in an Organized Health Care Education/Training Program

## 2020-01-01 LAB — AEROBIC CULTURE

## 2020-01-01 NOTE — Telephone Encounter (Signed)
Called pt to discuss biopsy results. Pt states Dr. Nyoka Cowden already messaged her about the results but appreciated another call to check in. 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 Advised pt to continue with betamethasone ointment; she can try this under occlusion if she desires. Pt appreciated call, all questions answered.    Charlestine Night, MD  PGY-2  Dermatology Resident

## 2020-01-02 ENCOUNTER — Telehealth: Payer: Self-pay | Admitting: Infectious Diseases

## 2020-01-02 NOTE — Telephone Encounter (Signed)
Noted positive urine culture from 12/30.  Called and spoke to patient, she reports that she is feeling well and denies any symptoms of UTI including fever, flank pain, blood in the urine.  Unclear why the culture was done.  She has not received any antibiotics for it.      We discussed that as long as she is asymptomatic, she does not require antibiotics.  She was instructed to call us if any symptoms do develop or if she is prescribed an antibiotic for UTI by another provider.  She was in agreement.

## 2020-01-03 ENCOUNTER — Encounter: Payer: Self-pay | Admitting: Dermatology

## 2020-01-04 ENCOUNTER — Ambulatory Visit: Payer: MEDICAID | Admitting: Obstetrics and Gynecology

## 2020-01-05 NOTE — Telephone Encounter (Signed)
This patient attachment is clinically relevant.  Please keep in the patient's chart.    Thank you,  Cindy Ochoa

## 2020-01-10 ENCOUNTER — Telehealth: Payer: Self-pay | Admitting: Urology

## 2020-01-10 NOTE — Telephone Encounter (Signed)
Received [employer] for [employer] form. Sent to provider for review.

## 2020-01-12 ENCOUNTER — Encounter: Payer: Self-pay | Admitting: Gastroenterology

## 2020-01-16 ENCOUNTER — Telehealth: Payer: Self-pay | Admitting: Urology

## 2020-01-19 ENCOUNTER — Encounter: Payer: Self-pay | Admitting: Internal Medicine

## 2020-01-19 NOTE — Telephone Encounter (Signed)
Called patient at this time re: [employer], Ignacia Felling, NP 25 minutes ago (2:43 PM)        i have a concern my blood pressure been 117/75 heart rate 124 all day I been feeling lightheaded what does this mean I haven't had bad Muscle spasms so I don't know what's going on with me               Left message on voicemail with call back number

## 2020-01-19 NOTE — Telephone Encounter (Signed)
Called patient at this time.      BP is normally low, SBPs 80s  HR was elevated this morning, 124 bpm.     Patient's aide helped her get out of the shower because she was feeling dizzy.   Patient states she does not have chest pain or shortness of breath. Denies blood in stool, vision changes, headaches, diarrhea, nausea, vomiting.     Patient feels better this afternoon compared to this morning. States she is on her 3rd large cup of water.   Patient denies new blood pressure medications.     Patient states when she has muscle spasms in her leg her heart rate is usually elevated, but she is not having spasms at the moment.     Patient checked her BP and HR on the phone with writer:   88/58 - this is normal for patient  HR 103     Vital signs appear stable for the patient. Writer encouraged patient to continue to monitor her vital signs over the weekend and inform us of how she is feeling on Monday. Writer strongly advised patient to go to the emergency department if she is having any chest pain, shortness of breath, worsening dizziness, headaches, nausea, vomiting, diarrhea, bloody stools, or fainting. Patient encouraged to continue to drink plenty of fluids and to avoid any sudden, quick movements. Patient states verbalized understanding. Patient will report back to [place] next week to determine if an appointment will be needed.

## 2020-01-22 ENCOUNTER — Telehealth: Payer: Self-pay | Admitting: Internal Medicine

## 2020-01-22 ENCOUNTER — Encounter: Payer: Self-pay | Admitting: Obstetrics and Gynecology

## 2020-01-22 NOTE — Telephone Encounter (Signed)
Copied from [place] [id number]. Topic: Access to Care - Speak to Provider/Office Staff  >> Jan 22, 2020  8:56 AM Kerby Nora wrote:  The patient is requesting to speak with [facility]. Patient stated that this is regarding some issues with her posterior..    Please call the patient back at [phone] to discuss.

## 2020-01-22 NOTE — Telephone Encounter (Signed)
Called and spoke with patient:     Pt states that yesterday, her care giver noticed that her hemorrhoid has worsened and is "really hanging out a lot."     Pt states that her aid reports it's about the size of a quarter. Pt notes she has very limited to no sensation to her buttocks, but does notice a "funny feeling" when they wipe her.     Pt is wondering about having her hemorrhoid removed - is requesting evaluation.     Pt declines today, requests tomorrow or later.   Scheduled with team APP D Bilsback for 1 PM tomorrow.     Pt notes that she will need to use a hoyer for transfer.

## 2020-01-23 ENCOUNTER — Encounter: Payer: Self-pay | Admitting: Internal Medicine

## 2020-01-23 ENCOUNTER — Ambulatory Visit: Payer: MEDICAID | Admitting: Internal Medicine

## 2020-01-29 ENCOUNTER — Encounter: Payer: Self-pay | Admitting: Internal Medicine

## 2020-01-29 ENCOUNTER — Telehealth: Payer: Self-pay | Admitting: Urology

## 2020-01-29 ENCOUNTER — Encounter: Payer: Self-pay | Admitting: Physical Medicine and Rehabilitation

## 2020-01-29 ENCOUNTER — Ambulatory Visit: Payer: MEDICAID | Attending: Physical Medicine and Rehabilitation | Admitting: Physical Medicine and Rehabilitation

## 2020-01-29 ENCOUNTER — Ambulatory Visit: Payer: MEDICAID | Admitting: Internal Medicine

## 2020-01-29 VITALS — BP 88/56 | HR 60 | Temp 98.1°F | Ht 65.0 in

## 2020-01-29 VITALS — Temp 95.4°F

## 2020-01-29 DIAGNOSIS — R252 Cramp and spasm: Secondary | ICD-10-CM | POA: Insufficient documentation

## 2020-01-29 DIAGNOSIS — K649 Unspecified hemorrhoids: Secondary | ICD-10-CM

## 2020-01-29 DIAGNOSIS — T300 Burn of unspecified body region, unspecified degree: Secondary | ICD-10-CM

## 2020-01-29 DIAGNOSIS — G8253 Quadriplegia, C5-C7 complete: Secondary | ICD-10-CM | POA: Insufficient documentation

## 2020-01-29 MED ORDER — ONABOTULINUMTOXINA 100 UNIT IJ SOLR *I*
600.0000 [IU] | Freq: Once | INTRAMUSCULAR | Status: AC
Start: 2020-01-29 — End: 2020-01-29
  Administered 2020-01-29: 600 [IU] via INTRAMUSCULAR

## 2020-01-29 NOTE — Progress Notes (Signed)
Botulinum Toxin Injection Procedure Note     Pre-operative Diagnosis: C6 complete tetraplegia    Indications: Spasticity that adversely impacts function     Interval hx:  Patient reports botox injections have been very helpful for spasticity and pain in bilateral legs    Procedure Details   The risks, benefits, indications, potential complications, and alternatives were explained to the patient and/or guardian who verbalized understanding and informed consent obtained.     The area for injection was identified and a time out called to re-identify.      The correct patient was identified with 2 identifiers The correct procedure, location site(s) and laterality were identified with the patient and/or guardian and correspond to the consent form  The appropriate site was marked and verified.  The patient was placed in the correct position.    After prepping the skin with alcohol overlying the following muscles, botlinum toxin was injected intramuscularly.     EMG guidance used for the muscles below to locate most active motor point/spasticity    R quadriceps (divided into 4 sites): 300 units  L quadriceps (divided into 4 sites): 300 units    Botulinum toxin Lot #: [id number]  Botulinum toxin expiration date: 09/2022  Total botox units injected: 600 units   Total botox units wasted: 0 units     The patient tolerated the procedure without complications.    Follow up 12 weeks for repeat injection    Alena Bills, MD

## 2020-01-29 NOTE — Telephone Encounter (Signed)
Received [employer] for equipment [facility], Two piece urostomy pouch flip value etc) Sent to provider for review. Initial Date 12/29/19

## 2020-01-30 ENCOUNTER — Encounter: Payer: Self-pay | Admitting: Gastroenterology

## 2020-01-30 ENCOUNTER — Ambulatory Visit: Payer: MEDICAID

## 2020-01-30 ENCOUNTER — Telehealth: Payer: Self-pay | Admitting: Urology

## 2020-01-30 NOTE — Telephone Encounter (Signed)
Equipment request form from [facility] completed and faxed back to [phone]. Check chart media details if needed. Sent to scanning.
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
## 2020-02-02 ENCOUNTER — Ambulatory Visit: Payer: MEDICAID | Attending: Surgery | Admitting: Pulmonary

## 2020-02-02 VITALS — BP 115/68 | HR 98 | Temp 96.1°F | Resp 16 | Ht 65.0 in | Wt 148.0 lb

## 2020-02-02 DIAGNOSIS — T2125XA Burn of second degree of buttock, initial encounter: Secondary | ICD-10-CM | POA: Insufficient documentation

## 2020-02-02 DIAGNOSIS — Z7689 Persons encountering health services in other specified circumstances: Secondary | ICD-10-CM
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
## 2020-02-05 DIAGNOSIS — T2125XA Burn of second degree of buttock, initial encounter: Secondary | ICD-10-CM

## 2020-02-05 HISTORY — DX: Burn of second degree of buttock, initial encounter: T21.25XA
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
## 2020-02-07 ENCOUNTER — Other Ambulatory Visit: Payer: Self-pay

## 2020-02-07 ENCOUNTER — Encounter: Payer: Self-pay | Admitting: Surgery

## 2020-02-07 ENCOUNTER — Ambulatory Visit: Payer: MEDICAID | Admitting: Surgery

## 2020-02-07 VITALS — BP 93/59 | HR 93 | Temp 97.7°F | Ht 65.0 in

## 2020-02-07 DIAGNOSIS — K6289 Other specified diseases of anus and rectum: Secondary | ICD-10-CM

## 2020-02-07 DIAGNOSIS — K649 Unspecified hemorrhoids: Secondary | ICD-10-CM

## 2020-02-07 MED ORDER — HYDROCORTISONE ACETATE 25 MG RE SUPP *I*
RECTAL | 2 refills | Status: DC
Start: 2020-02-07 — End: 2022-04-07
  Filled 2020-02-07: qty 12, 24d supply, fill #0

## 2020-02-07 NOTE — Patient Instructions (Signed)
1. Place Hydrocortisone suppository every other night for 2 weeks then as needed  2. RTC 2 months or sooner if necessary

## 2020-02-15 ENCOUNTER — Encounter: Payer: Self-pay | Admitting: Surgery

## 2020-02-15 ENCOUNTER — Other Ambulatory Visit: Payer: Self-pay

## 2020-02-16 ENCOUNTER — Other Ambulatory Visit: Payer: Self-pay

## 2020-02-26 ENCOUNTER — Other Ambulatory Visit: Payer: Self-pay

## 2020-02-29 ENCOUNTER — Encounter: Payer: Self-pay | Admitting: Surgery

## 2020-02-29 ENCOUNTER — Ambulatory Visit: Payer: MEDICAID | Attending: Surgery | Admitting: Surgery

## 2020-02-29 VITALS — BP 92/50 | HR 101 | Temp 97.0°F | Resp 16 | Ht 65.0 in | Wt 162.0 lb

## 2020-02-29 DIAGNOSIS — L89153 Pressure ulcer of sacral region, stage 3: Secondary | ICD-10-CM | POA: Insufficient documentation

## 2020-02-29 DIAGNOSIS — L89312 Pressure ulcer of right buttock, stage 2: Secondary | ICD-10-CM | POA: Insufficient documentation

## 2020-02-29 MED ORDER — LIDOCAINE HCL 2 % EX GEL (MULTIPLE PATIENTS) *I*
2.0000 mL | Freq: Once | CUTANEOUS | Status: AC
Start: 2020-02-29 — End: 2020-02-29
  Administered 2020-02-29: 2 mL via TOPICAL

## 2020-02-29 NOTE — Patient Instructions (Signed)
Please follow these instructions about how to care for your wound.        Please purchase Eucerin cream and moisturize all skin daily. You can purchase the [employer] version called Equate.    Wound Location Dressing Orders Frequency of Dressing Change   Location: Right buttock No wound today- just scarred    Location: sacrum No wound today- just scarred              Cleanse Wound(s) with:   []  Normal Saline  []  Keep Dry    [x]  Soap & Water with dressing changes, keep dressing dry in between          []   Assistive Device(s):   [x]   Wheelchair   []   Walker   []   Cane   []   Crutches      []   Wedge Shoe   []   Other:     []   Pressure Reduction:   []   Wheelchair Cushion   []   Mattress Overlay      []   Specialty Bed   [x]   Reposition every 2 hours         []   Limit time up in your chair to 2 hours or less at a time   []   Other:    []   Home Health:    []   VNS   []   LTC   []   HCR   []   Other:    []   General:    []   Stop / Decrease Smoking   []   Multivitamin   []   Exercise   []   Other:     Covering provider   []   Dr. Marland Mcalpine     []   Discharge from [facility] Discharge Instructions    Following the instructions below will give you the best opportunity for wound healing.    Wound Care Instructions (If changing own dressing):   Gather all supplies you will need to change your dressing.   Before changing your dressing, wash your hands for at least 15 seconds with warm soapy water.   Rinse off all soap, then dry with a towel.   Remove the old dressing.  Wash hands again before applying the new dressing.    If you experience any of the following during our business hours of 8 AM - 4:30 PM, Monday - Friday, please call the [place] at [phone]:    ? Increase in pain  ?Temperature over 101F   ?Drainage with a foul odor   ?Bleeding   ? Increase in swelling  ? Increase in drainage from your wound  ? Need for compression bandage changes (slippage, breakthrough drainage)    Please  contact your primary care physician or proceed to the nearest emergency room if you experience any of the above after our business hours.     Please note your appointment(s) above - if you are unable to keep, kindly give 24 hours' notice.  Thank You!
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
## 2020-03-08 ENCOUNTER — Ambulatory Visit: Payer: MEDICAID | Admitting: Student in an Organized Health Care Education/Training Program

## 2020-03-12 ENCOUNTER — Encounter: Payer: Self-pay | Admitting: Physical Medicine and Rehabilitation

## 2020-03-14 ENCOUNTER — Other Ambulatory Visit: Payer: Self-pay

## 2020-03-15 ENCOUNTER — Other Ambulatory Visit: Payer: Self-pay

## 2020-03-19 ENCOUNTER — Encounter: Payer: Self-pay | Admitting: Student in an Organized Health Care Education/Training Program

## 2020-03-19 ENCOUNTER — Ambulatory Visit: Payer: MEDICAID | Admitting: Internal Medicine

## 2020-03-19 ENCOUNTER — Inpatient Hospital Stay
Admission: EM | Admit: 2020-03-19 | Discharge: 2020-03-21 | DRG: 720 | Disposition: A | Discharge status: Home Health | Payer: MEDICAID | Source: Ambulatory Visit | Attending: Internal Medicine | Admitting: Internal Medicine

## 2020-03-19 ENCOUNTER — Telehealth: Payer: Self-pay | Admitting: Internal Medicine

## 2020-03-19 ENCOUNTER — Inpatient Hospital Stay: Payer: MEDICAID

## 2020-03-19 VITALS — BP 130/80 | HR 117 | Temp 99.6°F

## 2020-03-19 DIAGNOSIS — R7881 Bacteremia: Secondary | ICD-10-CM

## 2020-03-19 DIAGNOSIS — B961 Klebsiella pneumoniae [K. pneumoniae] as the cause of diseases classified elsewhere: Secondary | ICD-10-CM | POA: Diagnosis present

## 2020-03-19 DIAGNOSIS — L02419 Cutaneous abscess of limb, unspecified: Secondary | ICD-10-CM | POA: Insufficient documentation

## 2020-03-19 DIAGNOSIS — G8254 Quadriplegia, C5-C7 incomplete: Secondary | ICD-10-CM

## 2020-03-19 DIAGNOSIS — K5909 Other constipation: Secondary | ICD-10-CM | POA: Diagnosis present

## 2020-03-19 DIAGNOSIS — A419 Sepsis, unspecified organism: Principal | ICD-10-CM | POA: Diagnosis present

## 2020-03-19 DIAGNOSIS — Z20822 Contact with and (suspected) exposure to covid-19: Secondary | ICD-10-CM | POA: Diagnosis present

## 2020-03-19 DIAGNOSIS — L0291 Cutaneous abscess, unspecified: Secondary | ICD-10-CM

## 2020-03-19 DIAGNOSIS — N319 Neuromuscular dysfunction of bladder, unspecified: Secondary | ICD-10-CM | POA: Diagnosis present

## 2020-03-19 DIAGNOSIS — G8250 Quadriplegia, unspecified: Secondary | ICD-10-CM | POA: Diagnosis present

## 2020-03-19 DIAGNOSIS — B9689 Other specified bacterial agents as the cause of diseases classified elsewhere: Secondary | ICD-10-CM | POA: Diagnosis present

## 2020-03-19 DIAGNOSIS — R509 Fever, unspecified: Secondary | ICD-10-CM

## 2020-03-19 DIAGNOSIS — L02411 Cutaneous abscess of right axilla: Secondary | ICD-10-CM | POA: Diagnosis present

## 2020-03-19 DIAGNOSIS — Z87440 Personal history of urinary (tract) infections: Secondary | ICD-10-CM

## 2020-03-19 DIAGNOSIS — Z936 Other artificial openings of urinary tract status: Secondary | ICD-10-CM

## 2020-03-19 DIAGNOSIS — L89159 Pressure ulcer of sacral region, unspecified stage: Secondary | ICD-10-CM | POA: Diagnosis present

## 2020-03-19 DIAGNOSIS — K592 Neurogenic bowel, not elsewhere classified: Secondary | ICD-10-CM | POA: Diagnosis present

## 2020-03-19 LAB — CBC AND DIFFERENTIAL
Baso # K/uL: 0 10*3/uL (ref 0.0–0.1)
Basophil %: 0.2 %
Eos # K/uL: 0 10*3/uL (ref 0.0–0.4)
Eosinophil %: 0.1 %
Hematocrit: 43 % (ref 34–45)
Hemoglobin: 13.6 g/dL (ref 11.2–15.7)
IMM Granulocytes #: 0 10*3/uL (ref 0.0–0.0)
IMM Granulocytes: 0.3 %
Lymph # K/uL: 1.9 10*3/uL (ref 1.2–3.7)
Lymphocyte %: 15.4 %
MCH: 30 pg (ref 26–32)
MCHC: 32 g/dL (ref 32–36)
MCV: 95 fL (ref 79–95)
Mono # K/uL: 0.6 10*3/uL (ref 0.2–0.9)
Monocyte %: 4.5 %
Neut # K/uL: 9.8 10*3/uL — ABNORMAL HIGH (ref 1.6–6.1)
Nucl RBC # K/uL: 0 10*3/uL (ref 0.0–0.0)
Nucl RBC %: 0 /100 WBC (ref 0.0–0.2)
Platelets: 129 10*3/uL — ABNORMAL LOW (ref 160–370)
RBC: 4.5 MIL/uL (ref 3.9–5.2)
RDW: 14.6 % — ABNORMAL HIGH (ref 11.7–14.4)
Seg Neut %: 79.5 %
WBC: 12.3 10*3/uL — ABNORMAL HIGH (ref 4.0–10.0)

## 2020-03-19 LAB — RUQ PANEL (ED ONLY)
Albumin: 4.4 g/dL (ref 3.5–5.2)
Bilirubin,Direct: <0.2 mg/dL (ref 0.0–0.3)
Bilirubin,Total: 0.5 mg/dL (ref 0.0–1.2)
Lipase: 35 U/L (ref 13–60)

## 2020-03-19 LAB — BASIC METABOLIC PANEL
Calcium: 9.8 mg/dL (ref 8.8–10.2)
Chloride: 104 mmol/L (ref 96–108)
Creatinine: 0.48 mg/dL — ABNORMAL LOW (ref 0.51–0.95)
GFR,Black: 147 *
GFR,Caucasian: 128 *
Glucose: 128 mg/dL — ABNORMAL HIGH (ref 60–99)
Lab: 16 mg/dL (ref 6–20)
Sodium: 137 mmol/L (ref 133–145)

## 2020-03-19 LAB — BLOOD BANK HOLD RED

## 2020-03-19 LAB — URINALYSIS REFLEX TO CULTURE
Glucose,UA: NEGATIVE mg/dL
Nitrite,UA: POSITIVE — AB
Specific Gravity,UA: 1.014 (ref 1.002–1.030)
pH,UA: 6.5 (ref 5.0–8.0)

## 2020-03-19 LAB — PROTIME-INR
INR: 1.2 — ABNORMAL HIGH (ref 0.9–1.1)
Protime: 14.2 s — ABNORMAL HIGH (ref 10.0–12.9)

## 2020-03-19 LAB — LACTATE, PLASMA: Lactate: 1.3 mmol/L (ref 0.5–2.2)

## 2020-03-19 LAB — HEPATIC FUNCTION PANEL
ALT: 15 U/L (ref 0–35)
AST: 13 U/L (ref 0–35)
Albumin: 3.1 g/dL — ABNORMAL LOW (ref 3.5–5.2)
Alk Phos: 46 U/L (ref 35–105)
Bilirubin,Direct: <0.2 mg/dL (ref 0.0–0.3)
Bilirubin,Total: 0.2 mg/dL (ref 0.0–1.2)
Total Protein: 5.3 g/dL — ABNORMAL LOW (ref 6.3–7.7)

## 2020-03-19 LAB — BLOOD BANK HOLD LAVENDER

## 2020-03-19 LAB — DATE/TIME NOT PROVIDED

## 2020-03-19 LAB — URINE MICROSCOPIC (IQ200): Squam Epithel,UA: NONE SEEN /lpf (ref 0–?)

## 2020-03-19 LAB — PLASMA PROF 7 (ED ONLY)
Anion Gap,PL: 15 (ref 7–16)
CO2,Plasma: 21 mmol/L (ref 20–28)
Chloride,Plasma: 105 mmol/L (ref 96–108)
Creatinine: 0.54 mg/dL (ref 0.51–0.95)
GFR,Black: 142 *
GFR,Caucasian: 123 *
Glucose,Plasma: 131 mg/dL — ABNORMAL HIGH (ref 60–99)
Potassium,Plasma: 4.2 mmol/L (ref 3.3–4.6)
Sodium,Plasma: 141 mmol/L (ref 133–145)
UN,Plasma: 16 mg/dL (ref 6–20)

## 2020-03-19 LAB — COVID-19 PCR

## 2020-03-19 LAB — PERFORMING LAB

## 2020-03-19 LAB — TYPE AND SCREEN
ABO RH Blood Type: O POS
Antibody Screen: NEGATIVE

## 2020-03-19 LAB — HOLD SST

## 2020-03-19 LAB — HOLD BLUE

## 2020-03-19 LAB — HOLD GRAY

## 2020-03-19 LAB — LACTATE, VENOUS, WHOLE BLOOD
Lactate VEN,WB: 1.4 mmol/L (ref 0.5–2.2)
Lactate VEN,WB: 2 mmol/L (ref 0.5–2.2)

## 2020-03-19 LAB — COVID-19 NAAT (PCR): COVID-19 NAAT (PCR): NEGATIVE

## 2020-03-19 LAB — HOLD LAVENDER

## 2020-03-19 MED ORDER — ACETAMINOPHEN 325 MG PO TABS *I*
650.0000 mg | ORAL_TABLET | ORAL | Status: DC | PRN
Start: 2020-03-19 — End: 2020-03-19

## 2020-03-19 MED ORDER — SODIUM CHLORIDE 0.9 % IV BOLUS *I*
1000.0000 mL | Freq: Once | Status: AC
Start: 2020-03-19 — End: 2020-03-19
  Administered 2020-03-19: 1000 mL via INTRAVENOUS

## 2020-03-19 MED ORDER — LIDOCAINE-EPINEPHRINE 1 %-1:100000 IJ SOLN *I*
10.0000 mL | Freq: Once | INTRAMUSCULAR | Status: AC
Start: 2020-03-19 — End: 2020-03-20
  Administered 2020-03-20: 10 mL via SUBCUTANEOUS
  Filled 2020-03-19: qty 30

## 2020-03-19 MED ORDER — SODIUM CHLORIDE 0.9 % FLUSH FOR PUMPS *I*
0.0000 mL/h | INTRAVENOUS | Status: DC | PRN
Start: 2020-03-19 — End: 2020-03-21
  Administered 2020-03-20: 20 mL/h
  Administered 2020-03-20: 275 mL/h via INTRAVENOUS
  Administered 2020-03-20: 275 mL/h
  Administered 2020-03-20: 20 mL/h
  Administered 2020-03-20: 275 mL/h
  Administered 2020-03-20: 275 mL/h via INTRAVENOUS
  Administered 2020-03-21: 20 mL/h
  Administered 2020-03-21 (×2): 275 mL/h

## 2020-03-19 MED ORDER — SODIUM CHLORIDE 0.9 % FLUSH FOR PUMPS *I*
0.0000 mL/h | INTRAVENOUS | Status: DC | PRN
Start: 2020-03-19 — End: 2020-03-19

## 2020-03-19 MED ORDER — VANCOMYCIN HCL IN NACL 1250 MG/275 ML IV SOLN *I*
20.0000 mg/kg | Freq: Once | INTRAVENOUS | Status: DC
Start: 2020-03-19 — End: 2020-03-19

## 2020-03-19 MED ORDER — ALBUTEROL SULFATE (2.5 MG/3ML) 0.083% IN NEBU *I*
2.5000 mg | INHALATION_SOLUTION | RESPIRATORY_TRACT | Status: DC | PRN
Start: 2020-03-19 — End: 2020-03-19

## 2020-03-19 MED ORDER — OXYCODONE HCL 5 MG PO TABS *I*
5.0000 mg | ORAL_TABLET | Freq: Four times a day (QID) | ORAL | Status: DC | PRN
Start: 2020-03-19 — End: 2020-03-20

## 2020-03-19 MED ORDER — DEXTROSE 5 % FLUSH FOR PUMPS *I*
0.0000 mL/h | INTRAVENOUS | Status: DC | PRN
Start: 2020-03-19 — End: 2020-03-21

## 2020-03-19 MED ORDER — VANCOMYCIN HCL IN NACL 1250 MG/275 ML IV SOLN *I*
20.0000 mg/kg | Freq: Once | INTRAVENOUS | Status: AC
Start: 2020-03-19 — End: 2020-03-19
  Administered 2020-03-19: 1250 mg via INTRAVENOUS
  Filled 2020-03-19: qty 275

## 2020-03-19 MED ORDER — VANCOMYCIN HCL IN NACL 1000 MG/275 ML IV SOLN *I*
1000.0000 mg | Freq: Three times a day (TID) | INTRAVENOUS | Status: DC
Start: 2020-03-20 — End: 2020-03-21
  Administered 2020-03-20 – 2020-03-21 (×4): 1000 mg via INTRAVENOUS
  Filled 2020-03-19: qty 275
  Filled 2020-03-19: qty 1000
  Filled 2020-03-19 (×3): qty 275
  Filled 2020-03-19 (×2): qty 1000
  Filled 2020-03-19: qty 275

## 2020-03-19 MED ORDER — OXYCODONE HCL 5 MG PO TABS *I*
5.0000 mg | ORAL_TABLET | Freq: Once | ORAL | Status: AC
Start: 2020-03-19 — End: 2020-03-19
  Administered 2020-03-19: 5 mg via ORAL
  Filled 2020-03-19: qty 1

## 2020-03-19 MED ORDER — BISACODYL 10 MG RE SUPP *I*
10.0000 mg | Freq: Every day | RECTAL | Status: DC | PRN
Start: 2020-03-19 — End: 2020-03-20

## 2020-03-19 MED ORDER — DEXTROSE 5 % FLUSH FOR PUMPS *I*
0.0000 mL/h | INTRAVENOUS | Status: DC | PRN
Start: 2020-03-19 — End: 2020-03-19

## 2020-03-19 MED ORDER — VANCOMYCIN IV - PHARMACIST TO DOSE PLACEHOLDER *I*
Status: DC
Start: 2020-03-19 — End: 2020-03-21

## 2020-03-19 MED ORDER — ACETAMINOPHEN 500 MG PO TABS *I*
1000.0000 mg | ORAL_TABLET | Freq: Three times a day (TID) | ORAL | Status: DC
Start: 2020-03-19 — End: 2020-03-20
  Administered 2020-03-19: 1000 mg via ORAL
  Filled 2020-03-19: qty 2

## 2020-03-19 MED ORDER — POLYETHYLENE GLYCOL 3350 PO PACK 17 GM *I*
17.0000 g | PACK | Freq: Every day | ORAL | Status: DC
Start: 2020-03-20 — End: 2020-03-20

## 2020-03-19 NOTE — Telephone Encounter (Addendum)
Called patient at this time.   Left message with call back number.    12:08 PM spoke with patient  Patient states the boil is starting to drain. Her heart rate is "up" and checked it 4 different times. Patient is not having labored breathing and has not complained of labored breathing at all.  Patient is a quadriplegic and told writer she is unable to tell if she has body aches. Patient states feeling lightheaded this morning which is not normal. She denies nausea, vomiting or diarrhea.   Patient states the boil is a little bit bigger than a grape, and drainage is a "yellow mucus."   Patient has not noticed a foul smell.  Temp is 99.5 F.   The boil is swollen and red, located under the right armpit.   [facility] at 2pm for further evaluation. Patient states she will be attending this appointment.

## 2020-03-19 NOTE — ED Notes (Signed)
Low Air Loss Mattress ordered.
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
## 2020-03-19 NOTE — Telephone Encounter (Signed)
Copied from [place] [id number]. Topic: Urgent/Emergent - Protocol-Specific Urgent  >> Mar 19, 2020 11:41 AM Tilden Dome wrote:  Patient states she has a boil under her right arm pit she feels is infected. Marland Kitchen     Heart rate is 135.  Patient with labored breathing     Patient can be reached [phone]    Warm transferred to [place] at [facility].

## 2020-03-19 NOTE — ED Triage Notes (Signed)
See call in note.  Sent in for abscess and concern for sepsis.  Diaphoretic and shaking chills in triage.         Triage Note   Aundra Millet, RN

## 2020-03-19 NOTE — Progress Notes (Signed)
Vancomycin Maintenance Regimen - Pharmacist to Dose    Patient is receiving Vancomycin 1250 mg ONCE for Skin/Soft Tissue;Empiric Therapy. Today is day 1 of therapy.    Goal vancomycin trough is 15-20 mcg/mL.    Laboratory Data      Lab results: 03/19/20  1840 12/27/19  1058 09/08/19  1304   WBC 12.3* 6.4 6.6         Lab results: 03/19/20  1841 03/19/20  1840 12/27/19  1058   Creatinine 0.48* 0.54 0.53         Lab results: 03/19/20  1841 12/27/19  1058 09/21/19  1417   UN 16 10 11      Assessment and Plan   Considering patient's renal function, volume status, clinical status, and reported concentrations, 1000 mg Q8H will obtain goal vancomycin concentration unless renal function or clinical status changes.    Next vancomycin concentration before the 4th or 5th dose, order has not been placed at this time.    Pharmacist will follow for changes in renal function, toxicity, and efficacy and order serum concentrations and creatinine as needed.    For questions contact pharmacy at extension [place], PharmD, [place]  Emergency Medicine Clinical Pharmacist

## 2020-03-19 NOTE — ED Notes (Signed)
Assumed care of pt. Pt presents to the ED with c/o RUE pain around the site of an axillary abscess. Also c/o chills, dizziness. Pt extremely diaphoretic upon arrival. Pt transferred via hoyer with 3 nurse assist to stretcher, changed into gown, and placed on monitor. Peripheral IV unable to be obtained [place] multiple nurses at this time, provider notified.

## 2020-03-19 NOTE — First Provider Contact (Signed)
ED First Provider Contact Note    Initial provider evaluation performed by   ED First Provider Contact     Date/Time Event User Comments    03/19/20 1611 ED First Provider Contact Sunita Demond A Initial Face to Face Provider Contact          Vital signs reviewed.    Assessment: See call in - unable to view the abscess at triage. Pt is sweating which is not typical for her. Reports that her BP always tends to be low     Orders placed:  direct to bed / labs      Patient requires further evaluation.     [place], PA, 03/19/2020, 4:11 PM     Vinie Sill, [place]  03/19/20 1612

## 2020-03-19 NOTE — Telephone Encounter (Signed)
Patient stated she was feeling lightheaded and deined  chest pain and SOB      Nurse will call patient back

## 2020-03-19 NOTE — Patient Instructions (Signed)
Transfer to ED for concern of bacteremia secondary to skin infection. You will need blood cultures, and possibly systemic antibiotics.

## 2020-03-19 NOTE — ED Notes (Signed)
Provider returned page regarding pts manual BP of 88/50. Provider states that they are okay with blood pressure as a new bolus was just hung. Pt placed on continuous BP monitoring, has no complaints at this time. CRN notified as well, reuqest for low air loss mattress made. Bed delivery requested. Will continue to monitor.
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
## 2020-03-19 NOTE — ED Notes (Signed)
Pt moved to hospital bed to promote skin integrity and comfort.

## 2020-03-19 NOTE — ED Notes (Signed)
03/19/20 1521   Expected Call-In Information   ED Service [facility] Adult Call-in   PCP/Service Referral [facility]   Call received from [facility]? No   Pt Info note/Reason for sending Pt who is a quad d/t C6 injury now with axillary abscess, concern for systemic infection, bacteremia, need for iv abx.   Pt Coming from MD Office   Requested Evaluation By Adult ED   Does referring physician have admitting privileges? Yes   Location of PCP have admitting privileges [facility]   Call reported to call in note

## 2020-03-20 DIAGNOSIS — G8254 Quadriplegia, C5-C7 incomplete: Secondary | ICD-10-CM

## 2020-03-20 DIAGNOSIS — L89159 Pressure ulcer of sacral region, unspecified stage: Secondary | ICD-10-CM

## 2020-03-20 DIAGNOSIS — R2231 Localized swelling, mass and lump, right upper limb: Secondary | ICD-10-CM

## 2020-03-20 DIAGNOSIS — G909 Disorder of the autonomic nervous system, unspecified: Secondary | ICD-10-CM

## 2020-03-20 LAB — BASIC METABOLIC PANEL
Anion Gap: 11 (ref 7–16)
CO2: 19 mmol/L — ABNORMAL LOW (ref 20–28)
Calcium: 8.7 mg/dL — ABNORMAL LOW (ref 8.8–10.2)
Chloride: 108 mmol/L (ref 96–108)
Creatinine: 0.49 mg/dL — ABNORMAL LOW (ref 0.51–0.95)
GFR,Black: 146 *
GFR,Caucasian: 127 *
Glucose: 160 mg/dL — ABNORMAL HIGH (ref 60–99)
Lab: 13 mg/dL (ref 6–20)
Potassium: 3.9 mmol/L (ref 3.3–5.1)
Sodium: 138 mmol/L (ref 133–145)

## 2020-03-20 LAB — CBC AND DIFFERENTIAL
Baso # K/uL: 0 10*3/uL (ref 0.0–0.1)
Basophil %: 0.2 %
Eos # K/uL: 0 10*3/uL (ref 0.0–0.4)
Eosinophil %: 0.3 %
Hematocrit: 38 % (ref 34–45)
Hemoglobin: 11.7 g/dL (ref 11.2–15.7)
IMM Granulocytes #: 0 10*3/uL (ref 0.0–0.0)
IMM Granulocytes: 0.3 %
Lymph # K/uL: 3.3 10*3/uL (ref 1.2–3.7)
Lymphocyte %: 28.3 %
MCH: 30 pg (ref 26–32)
MCHC: 31 g/dL — ABNORMAL LOW (ref 32–36)
MCV: 98 fL — ABNORMAL HIGH (ref 79–95)
Mono # K/uL: 0.8 10*3/uL (ref 0.2–0.9)
Monocyte %: 6.9 %
Neut # K/uL: 7.4 10*3/uL — ABNORMAL HIGH (ref 1.6–6.1)
Nucl RBC # K/uL: 0 10*3/uL (ref 0.0–0.0)
Nucl RBC %: 0 /100 WBC (ref 0.0–0.2)
Platelets: 103 10*3/uL — ABNORMAL LOW (ref 160–370)
RBC: 3.9 MIL/uL (ref 3.9–5.2)
RDW: 14.7 % — ABNORMAL HIGH (ref 11.7–14.4)
Seg Neut %: 64 %
WBC: 11.6 10*3/uL — ABNORMAL HIGH (ref 4.0–10.0)

## 2020-03-20 LAB — GRAM STAIN: Gram Stain: 0

## 2020-03-20 LAB — MRSA (ORSA) AMPLIFICATION: MRSA (ORSA) Amplification: 0

## 2020-03-20 LAB — HOLD GREEN WITH GEL

## 2020-03-20 MED ORDER — ACETAMINOPHEN 500 MG PO TABS *I*
1000.0000 mg | ORAL_TABLET | Freq: Three times a day (TID) | ORAL | Status: DC | PRN
Start: 2020-03-20 — End: 2020-03-21
  Administered 2020-03-21: 1000 mg via ORAL
  Filled 2020-03-20: qty 2

## 2020-03-20 MED ORDER — GENERIC DME *A*
0 refills | Status: AC
Start: 2020-03-20 — End: ?

## 2020-03-20 MED ORDER — POLYETHYLENE GLYCOL 3350 PO PACK 17 GM *I*
17.0000 g | PACK | Freq: Two times a day (BID) | ORAL | Status: DC
Start: 2020-03-20 — End: 2020-03-21
  Filled 2020-03-20 (×3): qty 17

## 2020-03-20 MED ORDER — OXYCODONE HCL 5 MG PO TABS *I*
2.5000 mg | ORAL_TABLET | Freq: Once | ORAL | Status: AC
Start: 2020-03-20 — End: 2020-03-20
  Administered 2020-03-20: 2.5 mg via ORAL
  Filled 2020-03-20: qty 1

## 2020-03-20 MED ORDER — MELATONIN 3 MG PO TABS *I*
3.0000 mg | ORAL_TABLET | Freq: Every day | ORAL | Status: DC
Start: 2020-03-20 — End: 2020-03-21
  Administered 2020-03-20: 3 mg via ORAL
  Filled 2020-03-20: qty 1

## 2020-03-20 MED ORDER — BISACODYL 10 MG RE SUPP *I*
10.0000 mg | Freq: Every day | RECTAL | Status: DC | PRN
Start: 2020-03-20 — End: 2020-03-21

## 2020-03-20 MED ORDER — SENNOSIDES 8.6 MG PO TABS *I*
2.0000 | ORAL_TABLET | Freq: Two times a day (BID) | ORAL | Status: DC
Start: 2020-03-20 — End: 2020-03-21
  Filled 2020-03-20 (×3): qty 2

## 2020-03-20 NOTE — Progress Notes (Signed)
Home Health Assessment    Completed by: Tavari Loadholt A Aralyn Nowak, RN  Phone: [phone]    Referred by: Mary Beth Elgin, RN       Source of Information: medical record      Home Health indicators present: May need PT, OT or ST at home      Barriers to discharge to be addressed: None      Question Patient? No      Plan: Home care referral started; agency chosen [facility]      Comments: Will follow progress and set up with skilled nursing services and as well as evaluation for any further home care needs at discharge.    Chey Cho, RN [facility]  [facility]  [phone]  Weekends/Holidays [phone]  After Hours [phone] Option 4

## 2020-03-20 NOTE — ED Notes (Signed)
Report Given To  Lorelee Cover, RN      Descriptive Sentence / Reason for Admission   Pt who is a quad d/t C6 injury now with axillary abscess, concern for systemic infection, bacteremia, need for iv abx.      Active Issues / Relevant Events   -A&Ox4, hoyer @ baseline  -Axillary abscess  -WBC 12.3  -Ileostomy  -2 22g PIV (left hand and left foot)  -Unstageable wounds to sacrum/buttocks  -Blood cultures and covid neg  -UA--> + for ketones, blood, proteins, nitrites  -Hypotensive @ baseline per pt  - FULL CODE      To Do List  -V/A  -Meds per [facility]      Anticipatory Guidance / Discharge Planning  Admit for axillary abscess

## 2020-03-20 NOTE — Progress Notes (Signed)
03/20/20 1500   [facility] Patient Class Review   Patient Class Review Inpatient     Patient class effective as of 03/19/2020.      Rayvon Char Dusty Raczkowski RN  Utilization Management  [phone]

## 2020-03-20 NOTE — Procedures (Signed)
Procedure Report    Procedure: Incision and drainage of right axillary abscess    Patient was seated comfortably and absorbant pad was placed. Abscess was examined and determined to fluctuant. The area was sterilely prepped with betadine solution and draping was placed. 1% lidocaine solution with epinephrine was infiltrated into subcutaneous tissue around proposed incision area. Anesthetic effect which was deemed adequate and the incision was made in linear fashion approx 6 cm in length across length of abscess. Purulent and bloody drainage was immediately expressed. The wound was digitally explored and further loculations were broken up using hemostat clamp. The wound was flushed with saline solution and guaze fluff was repeatedly packed and removed to drain wound. This was repeated several times until purulent discharge was no longer seen on removed guaze. There was approximately 5-10cc of purulent drainage expressed. Wound cultures were obtained. The wound was then packed with 1/2 inch iodoform guaze with tapered end left outside wound. Abdominal pad covering was then secured over site with tape.     A total of 20 cc of 1% lidocaine was administered.    Luther Bradley Urian Martenson, MD   03/20/2020   1:30 AM

## 2020-03-20 NOTE — ED Notes (Signed)
Pt boosted and repositioned in bed. Pt provided a snack and is now resting comfortably. Will continue to monitor.
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
## 2020-03-20 NOTE — Progress Notes (Signed)
Patient referred to [facility], and Leisa Lenz RN notified of referral. Patient will need wound care to I&D of R axillary abscess. Wound Ostomy nurse recommended Roho cushion for motorized wheelchair, and provider to write Rx.  [facility] will continue to assist with discharge planning needs.    Wallace Going, RN, BSN  Acute Care Coordinator Nurse Leader/Educator/Preceptor  [phone]; pager [phone]

## 2020-03-20 NOTE — ED Notes (Signed)
Report Given To  Deep, RN      Descriptive Sentence / Reason for Admission   Pt who is a quad d/t C6 injury now with axillary abscess, concern for systemic infection, bacteremia, need for iv abx.      Active Issues / Relevant Events   -A&Ox4, hoyer @ baseline  -Axillary abscess  -WBC 12.3  -Ileostomy  -2 22g PIV (left hand and left foot)  -Unstageable wounds to sacrum/buttocks  -Blood cultures and covid neg  -UA--> + for ketones, blood, proteins, nitrites  -Hypotensive @ baseline per pt        To Do List  -V/A  -Meds per [facility]      Anticipatory Guidance / Discharge Planning  Admit for axillary abscess

## 2020-03-20 NOTE — Plan of Care (Signed)
Brief Note    Called to bedside due to patient request to discharge. Patient shared that she was having pain in her shoulder and hasn't been able to sleep last night as her most significant concerns. She is due for another dose of IV abx tonight and plan is to start bactrim tomorrow pending clinical course. She is amenable to staying tonight, wrote for melatonin and oxycodone 2.5 mg once for shoulder pain.    Rocco Serene, MD  10:01 PM

## 2020-03-20 NOTE — Consults (Addendum)
Ostomy Note    Stoma type: Ileal conduit (urostomy)        Assessment: Well established urostomy created in August of 2013. Pt states she typically has her appliance changed once every 3 days and typically does not experience leaks. Her stoma is healthy and protrudes appropriately. Barnett Applebaum was able to tell me what the typical procedure is for changing the appliance. Utilizing catheter bag with connector device attaching it to urostomy.          Plan: Continue use of 1 3/4" flat wafer with 2" barrier ring & urostomy appliance; ensure there is about 1" of urine in bag before attaching to "overnight" catheter bag--change every 3 days & PRN        Shelby Dubin, RN 03/20/2020 12:25 PM         Wound Consult Note, Initial    [id number]    [facility]  34 y.o. year old female was referred by medical team for evaluation and treatment recommendations regarding: concern for POA skin breakdown.   HPI, PMH/PSH, Labs, MAR reviewed.    Wound first identified/assessed on admission    Wound associated complaints:    []  Pain  []  Burning   []  Itching  []  Odor  [x]  No complaints at this time    Focused Exam    Patient alert, awake, NAD, pleasant and cooperative.  Head-to-toe skin assessment: visualized buttocks & sacral regions.  Last Braden risk assessment score 12 (high risk for skin breakdown)  Current mattress type: standard    Patient education re prevention/treatment of wounds  Discussed current appearance of healed wounds, recent f/u with outpatient wound healing center, typical dressings utilized, and pt's urostomy    Assessment  Healed pressure injuries of bilateral ischia and sacrum--at this time demonstrating scar tissue of wound beds in entirety of affected areas. Sites are healed despite not having completely "filled in" with granulation tissue. Bony prominences of BLE intact at this time.    Plan  Cavilon to intact scar tissue of ischia & sacrum  Pt states she has not had new pressure relieving cushion for her  wheelchair since she originally got it (over a decade ago)--spoke with care coordinator to determine if possible to obtain a new ROHO cushion to help limit potential for breakdown while she is mobilizing in wheelchair. (Pt states she is trying to order a new motorized wheelchair but the process can take 5-6 months, she's not anticipating having a new motorized wheelchair with new cushion until at least October).   Turn and position every two hours and PRN  LAL mattress to be ordered by staff (per bedside RN)  Offloading:  Use alternating Prevalon boot switched with each repositioning, "float" heel not utilizing boot--with each assessment and reassessment please remove completely to visualize then replace  Topical recommendations:     Sacrum, L & R ischia - Cleanse scar tissue with normal saline, gently dry. Apply Cavilon. Use every other day & PRN      Thank-you very much for consult.  Wound Nurse will plan follow-up as needed.     Lysle Dingwall  BSN RN CWOCN  PIC [phone]

## 2020-03-20 NOTE — ED Notes (Signed)
Assumed care of patient. Patient resting in bed. Doesn't appear to be in acute distress. Received report from previous RN. Will continue to monitor and Tx per orders.

## 2020-03-20 NOTE — ED Notes (Signed)
Report Given To  76 RN      Descriptive Sentence / Reason for Admission   Pt who is a quad d/t C6 injury now with axillary abscess, concern for systemic infection, bacteremia, need for iv abx.      Active Issues / Relevant Events   -A&Ox4, hoyer @ baseline  -R. Axillary abscess  -WBC 12.3  -Ileostomy  -2 22g PIV (left hand and left foot)  -Unstageable wounds to sacrum/buttocks  -Blood cultures and covid neg  -UA--> + for ketones, blood, proteins, nitrites  -Hypotensive @ baseline per pt  - FULL CODE      To Do List  - VS and Assess Q4  - Meds per MAR  - Pain Management  - IV abx      Anticipatory Guidance / Discharge Planning  Admit for axillary abscess
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
## 2020-03-20 NOTE — Progress Notes (Signed)
05-1399 Admission Note:    Admitted from: ED     Date/Time: 03/20/20 @ 0630    Belongings: Clothing, cell phone, purse, wallet, keys, personal mail, motorized wheel chair     Mentation: A+Ox3     Oxygen needs: N/A     Tele: Yes; sinus tach     Lines/Drain: 1 PIV 22g in L forearm, 1 PIV 22g in L foot     Fluids/Gtts: N/A     Pain: 0/10     Incisions/Wounds: several sacral ulcers; unstagable      Four-Eyed Skin Assessment Completed With: Katie, RN     Safety: bed in low and locked position, call bell within reach, frequent rounding     Other Issues:      The patient was oriented to the unit, room, call bell, phone, and unit policies.     Julieta Gutting, RN
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
## 2020-03-20 NOTE — Progress Notes (Signed)
[facility] MD CONFIRMATION    Confirmed signing MD: Thornell Sartorius  MD Address: [address], [place] [address]  Phone: [phone]; Fax: [phone]  Confirmed by: Per routine  Date of confirmation: 03/20/20    Barnett Applebaum, RN [place]  [facility] [phone]  After Hours [phone] Option 4

## 2020-03-21 ENCOUNTER — Other Ambulatory Visit: Payer: Self-pay

## 2020-03-21 LAB — CBC AND DIFFERENTIAL
Baso # K/uL: 0 10*3/uL (ref 0.0–0.1)
Basophil %: 0.2 %
Eos # K/uL: 0 10*3/uL (ref 0.0–0.4)
Eosinophil %: 0.3 %
Hematocrit: 39 % (ref 34–45)
Hemoglobin: 12.1 g/dL (ref 11.2–15.7)
IMM Granulocytes #: 0 10*3/uL (ref 0.0–0.0)
IMM Granulocytes: 0.2 %
Lymph # K/uL: 2.5 10*3/uL (ref 1.2–3.7)
Lymphocyte %: 27.5 %
MCH: 31 pg (ref 26–32)
MCHC: 31 g/dL — ABNORMAL LOW (ref 32–36)
MCV: 100 fL — ABNORMAL HIGH (ref 79–95)
Mono # K/uL: 0.7 10*3/uL (ref 0.2–0.9)
Monocyte %: 8.1 %
Neut # K/uL: 5.9 10*3/uL (ref 1.6–6.1)
Nucl RBC # K/uL: 0 10*3/uL (ref 0.0–0.0)
Nucl RBC %: 0 /100 WBC (ref 0.0–0.2)
Platelets: 102 10*3/uL — ABNORMAL LOW (ref 160–370)
RBC: 3.9 MIL/uL (ref 3.9–5.2)
RDW: 14.6 % — ABNORMAL HIGH (ref 11.7–14.4)
Seg Neut %: 63.7 %
WBC: 9.2 10*3/uL (ref 4.0–10.0)

## 2020-03-21 LAB — BASIC METABOLIC PANEL
Anion Gap: 10 (ref 7–16)
CO2: 22 mmol/L (ref 20–28)
Calcium: 9.4 mg/dL (ref 8.8–10.2)
Chloride: 109 mmol/L — ABNORMAL HIGH (ref 96–108)
Creatinine: 0.46 mg/dL — ABNORMAL LOW (ref 0.51–0.95)
GFR,Black: 149 *
GFR,Caucasian: 130 *
Glucose: 104 mg/dL — ABNORMAL HIGH (ref 60–99)
Lab: 9 mg/dL (ref 6–20)
Potassium: 4.3 mmol/L (ref 3.3–5.1)
Sodium: 141 mmol/L (ref 133–145)

## 2020-03-21 LAB — VANCOMYCIN, TROUGH: Vancomycin Trough: 10.7 ug/mL (ref 10.0–20.0)

## 2020-03-21 MED ORDER — GENERIC DME *A*
0 refills | Status: DC
Start: 2020-03-21 — End: 2024-05-16

## 2020-03-21 MED ORDER — SULFAMETHOXAZOLE-TRIMETHOPRIM 800-160 MG PO TABS *I*
1.0000 | ORAL_TABLET | Freq: Two times a day (BID) | ORAL | 0 refills | Status: AC
Start: 2020-03-21 — End: 2020-03-26
  Filled 2020-03-21: qty 10, 5d supply, fill #0

## 2020-03-21 MED ORDER — SULFAMETHOXAZOLE-TRIMETHOPRIM 800-160 MG PO TABS *I*
1.0000 | ORAL_TABLET | Freq: Two times a day (BID) | ORAL | Status: DC
Start: 2020-03-21 — End: 2020-03-21
  Administered 2020-03-21: 1 via ORAL
  Filled 2020-03-21 (×2): qty 1

## 2020-03-21 NOTE — Progress Notes (Signed)
Vancomycin Maintenance Regimen - Pharmacist to Dose    Patient is receiving Vancomycin 1250 mg Q8H for Skin/Soft Tissue. Today is day 3 of therapy.    Goal vancomycin trough is 10-15 mcg/mL.    Laboratory Data      Lab results: 03/21/20  0609 03/20/20  0210 03/19/20  1840   WBC 9.2 11.6* 12.3*         Lab results: 03/21/20  0609 03/20/20  0210 03/19/20  1841   Creatinine 0.46* 0.49* 0.48*         Lab results: 03/21/20  0609 03/20/20  0210 03/19/20  1841   UN 9 13 16      Vancomycin concentrations:       Lab results: 03/21/20  0609   Vancomycin Trough 10.7       Assessment and Plan   It is recommended to maintain current vancomycin regimen if plans to continue.    Pharmacist will follow for changes in renal function, toxicity, and efficacy and order serum concentrations and creatinine as needed.    Mingo Amber, PharmD  Internal Medicine Pharmacy Specialist, unit [phone]   [phone]

## 2020-03-21 NOTE — Progress Notes (Signed)
[facility]   Transportation Request Form / Physician Certification Statement     Patient Name:  Cindy Ochoa     Date of Birth:  [birth year]   MRN: [id number]    Date of Service: 03/21/20 Requested Time of Pick up: 2pm    Patient Location:  [facility], Unit  [phone]    Patient Destination:Home:   [address] [place] [address]    Number of steps into house?: 0    Requestors Name: [place] Rain Friedt Call Back Number: [phone]     Payor: Medicaid    Transport for (check what type of treatment or service, at least one):  Discharge    Specify what type of treatment or service: Discharge to Home     Is this treatment or service available at sending facility?:  no    Requested Mode of Transport: One man Chairmobile:  Patient has own chair:  Yes Physiological scientist   Height: Height: 162.6 cm (5\' 4" )  Weight: Weight: 63.5 kg (140 lb)   Round Trip/One Way: One Way    VENDOR: [employer]:  Fax # [phone]; Phone # [phone]     1. Medical condition that necessitates this mode of transport (i.e. oxygen, bed ridden, etc.): Cannot transfer/ambulate independently     2. What medical services are to be provided by crew?: None    3. Infection control needs (i.e. ORSA/VRE/Cdiff): no    4. What specific handling is required?: Positioning  Fall Precaution    5.  Patient mental status?: Normal Cognition    6. At time of transport is bed confinement ordered?: no        Is patient bed confined? no   Medical condition for bed confinement: no    Electronic Signature: [place] Sawatzky      Date:  03/21/20    Physical Signature: _______________________________________       Title: Discharge Planner
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
## 2020-03-21 NOTE — Progress Notes (Signed)
Patient interview -  [facility])    Home visit address/ contact phone confirmed:Yes/Yes    (1) Do you have any of these symptoms of a respiratory infection: fever (>100F), cough, sore throat, or shortness of breath? No  (2) Are you experiencing diarrhea, body aches, or chills? No  (3) Do you have new loss of taste or smell? No  (4) Have you had contact with someone in the last 14 days with a confirmed diagnosis of COVID-19 (positive test result) or who is currently under investigation for COVID-19, or ill with a respiratory illness? No  (5) Has anyone currently in your home traveled outside of [place] in the last 14 days? No  (6) Have you had a test (swab) sent for COVID-19 in the last 14 days? Yes/Negative      Does patient have technology in the home capable of virtual zoom visits (ie: smart phone, ipad, tablet, computer with camera) and does patient have the ability to use? Yes/Yes      Pets/ Smoking:No/No    Home set up/ lives with/designated caregiver (name and phone #): Ranch/son     ADL needs: Needs assistance, pt. Has CDPAP caregivers.    Current DME or supplies in home:Shower chair / over the toilet chair, power wheelchair, hoyer.    Medication management method: Bottles    Pneumococcal vaccine given and date..      05/28/11      Influenza vaccine given and date..   No  COVID vaccine given and dates....  No      Community or [facility] programs active with patient: CDPAP    Insurance Information:  Patient/family informed of home care/ DME copays  ___not applicable  _x__ Yes    Patient has been screened for Medicare as a Secondary Payer (MSP)  ___eligible for [facility]   _x__not eligible    MVA or WC case No    Barnett Applebaum, RN [place]  Weekends/Holidays [phone]  After Hours [phone] Option 4

## 2020-03-21 NOTE — Progress Notes (Signed)
Pharmacist Prior-to-Admission Medication History Review    I reviewed the medication history that was performed by a medication history technician and the list below reflects the best possible prior to admission medication list.    Patient lives at: Home    Sources of information:  Patient;Outside Meds Reconciliation (DrFirst);Pharmacy    Patient's pharmacy:  [facility]. Medisets/compliance packaging?: No    Medication history performed by: Roseanne Kaufman CPhT    Removed:    Betamethasone diprop augmented (DI-Prolene-AF) 0.05% ointment, apply topically 2 times daily as needed for rash on knees (states not using as it is not effective).    Clindamycin (Cleocin T) 1% external soln, apply topically 2 times daily (states not using).    Mupirocin (Bactroban) 2% ointment, apply topically 3 times daily to nares for 10 days (states not using).     Medications Prior to Admission   Medication Sig    bisacodyl (BISAC-EVAC) 10 MG suppository Place 1 suppository (10 mg total) rectally daily as needed    docusate sodium (COLACE) 100 MG capsule Take 1 capsule (100 mg total) by mouth 2 times daily    polyethylene glycol (GLYCOLAX) powder Take 17 g by mouth daily   Mix in 8 oz water or juice and drink.    senna-docusate (PERICOLACE) 8.6-50 MG per tablet Take 2 tablets by mouth daily as needed for Constipation    acetaminophen (MAPAP) 500 mg tablet Take 1 tablet (500 mg total) by mouth every 4-6 hours as needed   for pain    etonogestrel (NEXPLANON) 68 MG IMPL Inject 68 mg into the skin once   Placed 10/20/16    hydrocortisone (ANUSOL-HC) 25 MG suppository Place 1 suppository rectally  every other night    Non-System Medication Briefs/pullups  Ht: 5\' 5"  Wt: 143lbs  ICD code: N39.41    Non-System Medication Gloves  Ht: 5\' 5"  Wt: 143lbs  ICD code: N39.41    Non-System Medication Chux pads  Ht: 5\' 5"  Wt: 143lbs  ICD code: N39.41    disposable underpads 30"x36" (CHUX) Use 6 times daily and PRN. 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 For lifetime use; ICD 10: G82.54 Ht: 1.57m Wt: 76.2 kg    adjustable bath/shower seat with back For lifetime use; ICD 10: G82.54 Ht: 1.66m Wt: 76.2 kg    Non-System Medication Urostomy drainage bags 2000 cc change as needed. Dx N39.46 and  G82.54    generic DME Wafers  Dx code: N31.9  Qty: 10  Refills: 6    generic DME Urostomy Pouches  Dx Codes(s): N31.9  Qty: 20  Refills: 6    generic DME Leg bag  Dx Code: N31.9  Qty: 4  Refills: 6    generic DME Night drainage bag  Dx Code: N31.9  Qty: 4  Refills: 6    generic DME Barrier rings  Dx Code: N31.9  Qty: 15  Refills: 6    Ostomy Supplies 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 Use as directed.    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 Use as directed.    generic DME Repairs to hospital bed/hoyer lift  ICD 10: G82.54   Ht: 1.73m Wt: 76.2 kg    disposable gloves 2 boxes Disposable Medium size gloves N39.46    generic DME Urostomy drainage bags 2000 cc change as needed. Dx N39.46 and  G82.54    Gauze Pads & Dressings (ABDOMINAL PAD) 8"X10" PADS By 1 each no specified route daily   Cover buttock wounds 2x daily    Adhesive Tape (MEDIPORE SURGICAL 2"X10YD) TAPE Secure the 2 buttocks  wound dressings 2x day    Wound Dressings (HYDROFERA BLUE 4"X4") PADS Cut and moisten with saline as directed and pack in to buttock wound daily    Non-System Medication Gel overlay mattress for  hospital bed - diagnosis G82.54, L89.93, L89.159    Non-System Medication Easy Tip Leg Bags 1000mg  - diagnosis G82.53 N39.46     Mingo Amber, PharmD  Internal Medicine Pharmacy Specialist, unit [id number]   [phone]

## 2020-03-21 NOTE — Progress Notes (Addendum)
Patient cleared for discharge to: home with resumption of [facility] aide services and home care services. Aides will learn wound care, and patient is aware that home care     Home care agency notified: Leisa Lenz RN from [facility] .    DME ordered: none- Wound Ostomy Nurse recommended ROHO cushion for wheelchair, but ordering this now was not recommended by vendor. Renee from [facility] states that if she is getting a new power wheelchair in the fall, the ROHO might not fit, and will not be covered by insurance for another 5 years. Patient advised and agreeable to wait for new wheelchair and will ask for [facility] cushion. Leisa Lenz RN from [facility] aware of need to monitor skin to prevent further break down.    Follow up appointments scheduled per AVS. Patient prefers to schedule her follow up with surgery for Axillary wound check in 2 weeks. Nate RN reviewed wound care with patient's mother prior to discharge.    Wallace Going, RN, BSN  Acute Care Coordination Nurse Leader/Educator  [phone]; pager [phone]  Weekend covering Coordinator pager [phone]  Covering for Debbe Mounts RN, [facility]

## 2020-03-21 NOTE — Discharge Instructions (Addendum)
Brief Summary of Your Hospital Course (including key procedures and diagnostic test results):  You came into the hospital from urgent care because you had a tender bump in your armpit similar to an abscess you had about a year ago. You had an infected abscess in your armpit, so you were admitted to the hospital for treatment. You were given antibiotics to treat the infection, and surgery drained your abscess and took care of the wound. Sometimes your blood pressure got low due to the infection, so you were given IV fluids to bring your blood pressure back up. As you improved, we switched your antibiotics from IV to pill form. You continued to improve and were discharged to home on 03/21/20.    Your instructions:  - Take your medications as prescribed  - Follow up with your PCP as noted below  - Follow up with acute care surgery to reassess your wound; please call to schedule an appointment as noted below  - Get repeat labs in 1 week, to be forwarded to PCP  - Keep your wound clean, remove packing and repack with 1/2 inch plain packing gauze daily    New Medications:  - Bactrim DS 1 tablet every 12 hours for 5 days (Take last pill the morning of March 31st)  - 1/2 inch plain packing gauze     Changes to existing medications:  No changes    Recommended diet: Regular - No restrictions     Recommended activity: activity as tolerated    Wound Care: keep wound clean and dry and remove packing and repack with 1/2 inch plain packing gauze DAILY    If you experience any of these symptoms within the first 24 hours after discharge:Uncontrolled pain, Fever greater than 100.5, Chills or Increased redness, drainage or swelling from incision site please follow up with the discharge attending Dr. Angelica Ran at phone-number: [phone]    If you experience any of these symptoms 24 hours or more after discharge:Uncontrolled pain, Fever of 101 F. or greater, Chills or Increased redness, drainage or swelling from incision site  please follow up with your PCP:  Thornell Sartorius, MD [phone]         Abscess Incision and Drainage   WHAT YOU NEED TO KNOW:   An abscess incision and drainage (I and D) is a procedure to drain pus from an abscess and clean it out so it can heal.  DISCHARGE INSTRUCTIONS:   Contact your healthcare provider if:    The area around your abscess has red streaks or is warm and painful.      You have a fever or chills.      You have increased redness, swelling, or pain in your wound.     Your wound does not start to heal after a few days.     Your abscess returns.      You have questions or concerns about your condition or care.    Medicines:    NSAIDs , such as ibuprofen, help decrease swelling, pain, and fever. NSAIDs can cause stomach bleeding or kidney problems in certain people. If you take blood thinner medicine, always ask your healthcare provider if NSAIDs are safe for you. Always read the medicine label and follow directions.     Take your medicine as directed.  Contact your healthcare provider if you think your medicine is not helping or if you have side effects. Tell him or her if you are allergic to any medicine. Keep a  list of the medicines, vitamins, and herbs you take. Include the amounts, and when and why you take them. Bring the list or the pill bottles to follow-up visits. Carry your medicine list with you in case of an emergency.    Care for your wound as directed:    Do not remove your bandage  unless your healthcare provider says it is okay. Keep the bandage clean and dry. Remove your bandage and clean the wound once your healthcare provider gives you directions.      Apply heat  on the bandage over your wound for 20 to 30 minutes every 2 hours for as many days as directed. This will increase blood flow to the area and help it heal.     Elevate  your wound above level of your heart as often as you can. This will help decrease swelling and pain. Prop your wounded area on pillows or  blankets to keep it elevated comfortably.    Follow up with your healthcare provider as directed:  You may need to return in 1 to 3 days to have the gauze in your wound removed and your wound examined. You may be taught how to change the gauze in your wound. Write down your questions so you remember to ask them during your visits.   Copyright [employer] Information is for [employer] use only and may not be sold, redistributed or otherwise used for commercial purposes. All illustrations and images included in CareNotes are the copyrighted property of A.D.A.M., Inc. or [place]  The above information is an educational aid only. It is not intended as medical advice for individual conditions or treatments. Talk to your doctor, nurse or pharmacist before following any medical regimen to see if it is safe and effective for you.

## 2020-03-21 NOTE — Progress Notes (Signed)
HOME CARE DISCHARGE PLAN    The following services have been arranged with [facility] [id number]:  __x____Nursing       The agency will call to schedule first home visit date for (CHN,PT etc) services within 24-48 hours of facility discharge, and pt/family are in agreement.    Special instructions for scheduling first visit : None    The following medical equipment and/or supplies have been arranged : None    Blood draw for CMP and CBC with diff. has been arranged to be drawn on 03/28/20  through the following lab: [facility]  with results faxed to Thornell Sartorius, MD_    Focus of care and identified patient/caregiver teaching needs at home: Medication management, disease process teaching, pain/symptom management, abscess   assessment, home safety and strengthening.    Follow-up MD appts scheduled:  Follow up with [facility] will call with time and date of first visit. [address]   REFERRAL INFORMATION   [place] [address]  [phone]     Schedule an appointment with Acute Care Surgery as soon as possible for a visit in 2 week(s)  For wound re-check [address]   [place] [address]  [phone]   [phone] Discharge Follow Up with Maxie Barb, MD  Tuesday Mar 26, 2020 3:10 PM [facility] Internal Medicine  [address]   [place] [address]  [phone]    Go to Thornell Sartorius, MD  Tuesday Mar 26, 2020  at 3:10pm for hospital follow up with your PCP. You will see Dr. Carlis Abbott. [address]   [place] [address]  [phone]   Apr26 INJECTION with Alena Bills, MD  Monday Apr 22, 2020 11:30 AM Physical [place], [place]   [place] [address]  [phone]   Apr29 FOLLOW UP VISIT with Ezequiel Ganser, MD  Thursday Apr 25, 2020 3:00 PM [facility] Internal Medicine  [address]  [place] [address]  [place], RN [facility]  [place]  [phone]  Graylon Good [phone]  After Hours [phone] Option 4    The above arrangements are based on an in-hospital evaluation. It is short-term and will be re-evaluated by the [facility] Nurse in the home on a regular basis, as per physicians order.

## 2020-03-21 NOTE — Progress Notes (Signed)
Brief Progress Note:    Spoke with primary team regarding wound care, follow up plan.     Upon discharge home please ensure that the patient receives 1/2 inch plain packing gauze daily to her wound. Would ask that visiting nursing or some other assistive services would be available to her.    Patient should follow up in our office in 1-2 weeks for wound re-eval. I've populated this into her follow up section.     Girard Cooter, [place]    Please page the [place] resident with further questions regarding this patient's care, or call our outpatient office at [phone]

## 2020-03-21 NOTE — Plan of Care (Signed)
Problem: Safety  Goal: Patient will remain free of falls  Outcome: Patient discharged  Goal: Prevent any intentional injury  Outcome: Patient discharged     Problem: Pain/Comfort  Goal: Patient's pain or discomfort is manageable  Outcome: Patient discharged     Problem: Nutrition  Goal: Patient's nutritional status is maintained or improved  Outcome: Patient discharged     Problem: Mobility  Goal: Patient's functional status is maintained or improved  Outcome: Patient discharged     Problem: Psychosocial  Goal: Demonstrates ability to cope with illness  Outcome: Patient discharged     Problem: Cognitive function  Goal: Cognitive function will be maintained or return to baseline  Outcome: Patient discharged

## 2020-03-21 NOTE — Progress Notes (Signed)
[id number] Discharge Note:    Discharged to: Home     Date/Time: 03/21/20 at 1615     Discharged with any Lines/Drains/Tubes: Urostomy, with pouch in place    Belongings: All belongings accounted for with pt and her mother prior to D/C     Home Care: Spoke with patient via phone prior to D/C. Initial appointment set up.      Supplies/ Equipment: Pt sent home with 2-3 days worth of wound care supplies to cover patient until [place] is able to order supplies for her.      IV access removed: prior to D/C     Medications: D/C with PO Bactrim DS tabs     Transportation: Mother providing transportation via [employer].         The AVS was reviewed with patient and her mother (primary caretaker), all questions were answered, and understanding verbalized.    Otho Perl, RN
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
## 2020-03-22 ENCOUNTER — Telehealth: Payer: Self-pay | Admitting: Internal Medicine

## 2020-03-22 ENCOUNTER — Other Ambulatory Visit: Payer: Self-pay

## 2020-03-22 ENCOUNTER — Encounter: Payer: Self-pay | Admitting: Internal Medicine

## 2020-03-22 DIAGNOSIS — L02411 Cutaneous abscess of right axilla: Secondary | ICD-10-CM

## 2020-03-22 DIAGNOSIS — M79621 Pain in right upper arm: Secondary | ICD-10-CM

## 2020-03-22 DIAGNOSIS — G4700 Insomnia, unspecified: Secondary | ICD-10-CM

## 2020-03-22 LAB — AEROBIC CULTURE: Aerobic Culture: 0

## 2020-03-22 MED ORDER — MELATONIN 3 MG PO TABS *I*
3.0000 mg | ORAL_TABLET | Freq: Every evening | ORAL | 0 refills | Status: DC | PRN
Start: 2020-03-22 — End: 2020-10-16
  Filled 2020-03-22: qty 30, 30d supply, fill #0

## 2020-03-22 MED ORDER — OXYCODONE HCL 5 MG PO CAPS *A*
5.0000 mg | ORAL_CAPSULE | Freq: Every day | ORAL | 0 refills | Status: DC | PRN
Start: 2020-03-22 — End: 2020-03-26
  Filled 2020-03-22: qty 3, 3d supply, fill #0

## 2020-03-22 NOTE — Telephone Encounter (Signed)
Copied from [place] [phone]. Topic: Medications/Prescriptions - Medication Question/Problem  >> Mar 22, 2020  8:09 AM Agnes Lawrence wrote:  Ms. Brack is calling to request an alternate medication to be prescribed in place of her current medication Tylenol. This alternate medication is being requested because after being discharged from the ED last night she is still in a lot of pain and this medication is doing nothing to help. The patient uses [facility]. The patient would like a call back to discuss at [phone].

## 2020-03-22 NOTE — Telephone Encounter (Signed)
CONTACT 3: 24-48 Hour [facility] or ED Risk of Admission %:   Risk of Admission or ED Visit  Current as of about an hour ago      38% 40 - 100%: High Risk   20 - 40%: Medium Risk   0 - 20%: Low Risk     Last Change:         This score indicates an adult patient's 1-year risk, as a percentage, of a hospital admission or ED visit.             Current PCP:  Thornell Sartorius, MD    Hospital Admissions:  2    ED Visits:  0    Has Medicaid:  Yes    Has Medicare:  No    In relationship:  No    Has Anemia:  No    Has asthma:  No    Has atrial fibrillation:  No    Has CVD:  No    Has chronic kidney disease:  No    Has Chronic Obstructive Pulmonary Disease:  No    Has Congestive Heart Failure:  No    Has Connective Tissue Disorder:  No    Has Depression:  Yes    Has Diabetes:  No    Has liver disease:  No    Has Peripheral Vascular Disease:  No              Spoke to: patient    Are you feeling as good as you did when you left the hospital? No- having pain at open wound in armpit. Has already talked with practice nurse and requesting MD to evaluate for alternative med for pain    Has home care agency contacted patient yet? : yes-[facility]- Monday - Mother has been taught wound care and packing daily. Will bring packing supply to appt on 3/30 so that MD can check wound    If equipment was ordered at time of discharge, does the patient have this in the home? N/A    Is the patient aware of pending orders / testing? yes- labs by 4/1- will check with provider at [facility] appt to see if labwork can be done at [facility] appt.     Do you have any questions about your discharge instructions? no    Do you have an appointment scheduled with your PCP and know the date and time of that appointment? yes    Do you have transportation to get to that appointment? yes- aide drives     Do you have all of your medications listed on your discharge summary and are you taking them as they are written on the  bottle? yes    Discharge medications reviewed against outpatient MEDICAL RECORD NUMBERyes    Do you have any questions or concerns about your medications? no    If the patient is newly initiated on anticoagulation, are they aware who is managing this? N/A    Reviewed medications with: patient      SUMMARY:    Time spent on the call with the patient = 20 min    Patient Care Management assessment and plan of care/next steps: contact 4 when attends SIM appt.

## 2020-03-22 NOTE — Telephone Encounter (Signed)
Spoke with patient:     Pt reports that she had an abscess and was seen in ED.   On chart review, there was a bedside I&D on 3/24, she was started on IV vancomycin initially and quickly transitioned to PO ds-bactrim with a plan to complete a 7 day total ABX course.     Pt's mother has just changed her dressing today and home care (nursing) will be starting on Monday for wound care.   Pt does have aid service but they are not able to do wound care.     Pt states that she had to take four Tylenol tabs last night but didn't have relief; pt called the doctor that was "listed on discharge paperwork" last night but did not receive a call back.   Pt notes her pain has increased; denies new redness, fever, chills, drainage.     Pt reports pain "at the hole" and radiating throughout her armpit.   Pt states she's taken four Tylenol so far again today but without relief.     Pt states that she cannot take ibuprofen - states that last time she did she experienced lip swelling. This has been added as an allergy.     During the call, patient sent a photo via MyChart of the wound (please see photo in MyChart attachment).     Pt is requesting pain medication.

## 2020-03-22 NOTE — Telephone Encounter (Signed)
See phone encounter; routed to CCP.

## 2020-03-22 NOTE — Telephone Encounter (Addendum)
Routing to Dr Qu for advisement.

## 2020-03-22 NOTE — Telephone Encounter (Addendum)
Called patient. She reports that her pain was better in the hospital because there were more people supervising her all the time. Now she is at home and doesn't have as much supervision. The aides only come for a period of time. She reports the tylenol has not really helped her pain. She also starts crying feeling like it is incredibly painful at the moment. Saw that she got oxy 5mg  x1 on one day and oxy 2.5mg  x1 on the second day. She says she did not notice that one medicine worked better than another.     Offered that she should schedule tylenol 1000mg  TID regardless of whether she notices a difference. I am okay with giving her a limited supply of oxy until her appointment. She is asking for a medication to help with her sleep as she took something in the hospital that was helpful. She is worried about oxy causing worsening sedation and is okay with 2.5mg  a day. She says she does not have a pill cutter but is certain her aides can help. She is agreeable to 3 tablets of 5mg  oxycodone to help with her pain that she can also cut in half to do 2.5mg  if she needs.    Have also prescribed melatonin per her request.    Ezequiel Ganser, MD  Internal Medicine PGY-2

## 2020-03-22 NOTE — Telephone Encounter (Signed)
Copied from [place] [phone]. Topic: Return Call - Speak to Provider/Office Staff  >> Mar 22, 2020 12:12 PM Agnes Lawrence wrote:  Cindy Ochoa is calling back in much worse pain than earlier. Patient began crying while on the line. Patient would like someone to call her ASAP states "I am in to much pain and I can't just sit here in this kind of pain I need help." Patient can be reached at [phone].
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
## 2020-03-22 NOTE — Telephone Encounter (Signed)
Call to patient but no answer.   Left message to call office.   Call back number provided.

## 2020-03-22 NOTE — Telephone Encounter (Signed)
This patient attachment is clinically relevant.  Please keep in the patient's chart.    []  Document  [x]  Photo    Brief attachment description: wound  (Ex. L forearm rash, WC papers)    Thank you,  Oletta Lamas, RN

## 2020-03-22 NOTE — Telephone Encounter (Signed)
Review of AVS:    Date of Discharge:    03/21/20    Reason for admission:  Axillary Abscess    Discharged to :   Home    On Coumadin or Warfarin:  no    Follow up appointment:  03/26/2020 3:10 PM Maxie Barb, MD [facility]   04/22/2020 11:30 AM Alena Bills, MD [facility]   04/25/2020 3:00 PM Ezequiel Ganser, MD [facility]

## 2020-03-22 NOTE — Telephone Encounter (Signed)
Copied from [place] [phone]. Topic: Access to Care - Speak to Provider/Office Staff  >> Mar 22, 2020 12:29 PM Konrad Dolores wrote:  Patient's mother, [name], is calling to report very high pain level in her daughter.   She states patient is practically paralyzed due to the pain from the abscess surgery yesterday.  She is requesting a call back to her daughter as she's been waiting all morning after a sleepless night.    She is requesting urgent call back for her daughter at daughter's number [phone]

## 2020-03-23 ENCOUNTER — Other Ambulatory Visit: Payer: Self-pay

## 2020-03-23 ENCOUNTER — Other Ambulatory Visit: Payer: Self-pay | Admitting: Pulmonary Disease

## 2020-03-23 DIAGNOSIS — Z23 Encounter for immunization: Secondary | ICD-10-CM

## 2020-03-23 NOTE — Progress Notes (Signed)
Phone Call -- SIM After Hours Triage     Date of call: 03/23/2020  Time of call: 5:26 PM  Phone number: [name] 18-Oct-1986 got call because pt was having severe pain in wound, but she did not take any oxicodone yet as she picked up the medciations today in the afternoon.. Also [name] from Wound care called asking if she can used iodine cleaning on her wound. Cindy Ochoa will have an appointment at [facility] this Tuesday.    Impression     Recommended to take pain medication as prescribed. Also, I think that it is ok to use iodine cleaning in her wound    Disposition / Plan      Patient instructed to:  1. Attend to her appointment on Tuesday.   2. Instructed to call back if symptoms worsen, change or progress.    Author: Dalphine Handing, MD as of 03/23/2020  at 5:26 PM

## 2020-03-25 ENCOUNTER — Telehealth: Payer: Self-pay | Admitting: Internal Medicine

## 2020-03-25 LAB — ANAEROBIC CULTURE

## 2020-03-25 LAB — BLOOD CULTURE
Bacterial Blood Culture: 0
Bacterial Blood Culture: 0

## 2020-03-25 NOTE — Telephone Encounter (Signed)
Copied from [place] [phone]. Topic: Access to Care - Speak to Provider/Office Staff  >> Mar 25, 2020  1:01 PM Brendia Sacks wrote:  Cindy Ochoa [facility] patient reports that she is depressed half the time.    Patient is scheduled for an appt. Tomorrow  and patient to discuss with the doctor.  03/26/2020 3:10 PM Maxie Barb, MD     Benjamine Mola [phone], if needed.

## 2020-03-25 NOTE — Telephone Encounter (Signed)
Message noted.

## 2020-03-26 ENCOUNTER — Other Ambulatory Visit: Payer: Self-pay

## 2020-03-26 ENCOUNTER — Telehealth: Payer: Self-pay

## 2020-03-26 ENCOUNTER — Encounter: Payer: Self-pay | Admitting: Student in an Organized Health Care Education/Training Program

## 2020-03-26 ENCOUNTER — Ambulatory Visit
Payer: MEDICAID | Attending: Internal Medicine | Admitting: Student in an Organized Health Care Education/Training Program

## 2020-03-26 VITALS — BP 124/66 | HR 101 | Temp 98.2°F | Ht 65.0 in

## 2020-03-26 DIAGNOSIS — K5909 Other constipation: Secondary | ICD-10-CM

## 2020-03-26 DIAGNOSIS — L02419 Cutaneous abscess of limb, unspecified: Secondary | ICD-10-CM | POA: Insufficient documentation

## 2020-03-26 MED ORDER — OXYCODONE HCL 5 MG PO TABS *I*
5.0000 mg | ORAL_TABLET | Freq: Every day | ORAL | 0 refills | Status: AC | PRN
Start: 2020-03-26 — End: 2020-04-06
  Filled 2020-03-26: qty 10, 10d supply, fill #0

## 2020-03-26 MED ORDER — DOCUSATE SODIUM 100 MG PO CAPS *I*
100.0000 mg | ORAL_CAPSULE | Freq: Two times a day (BID) | ORAL | 5 refills | Status: DC
Start: 2020-03-26 — End: 2020-10-16
  Filled 2020-03-26: qty 60, 30d supply, fill #0
  Filled 2020-05-18: qty 60, 30d supply, fill #1

## 2020-03-26 MED ORDER — BISACODYL 10 MG RE SUPP *I*
10.0000 mg | Freq: Every day | RECTAL | 3 refills | Status: DC | PRN
Start: 2020-03-26 — End: 2020-10-16
  Filled 2020-03-26: qty 50, 50d supply, fill #0

## 2020-03-26 NOTE — Telephone Encounter (Signed)
CONTACT 4: Care Manager Telephone Outreach     Patient has attended SIM appt today for contact 4. No care management needs identified.   Patient Care Management assessment and plan of care/next steps: Close to transition care.
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
## 2020-03-27 ENCOUNTER — Other Ambulatory Visit: Payer: Self-pay

## 2020-03-27 NOTE — Telephone Encounter (Signed)
This patient attachment is clinically relevant.  Please keep in the patient's chart.    []  Document  [x]  Photo    Brief attachment description:     Thank you,  Zollie Beckers, NP

## 2020-03-28 ENCOUNTER — Other Ambulatory Visit
Admission: RE | Admit: 2020-03-28 | Discharge: 2020-03-28 | Disposition: A | Discharge status: Home / Self Care | Payer: MEDICAID | Source: Ambulatory Visit | Attending: Internal Medicine | Admitting: Internal Medicine

## 2020-03-28 ENCOUNTER — Encounter: Payer: Self-pay | Admitting: Internal Medicine

## 2020-03-28 DIAGNOSIS — L02419 Cutaneous abscess of limb, unspecified: Secondary | ICD-10-CM | POA: Insufficient documentation

## 2020-03-28 LAB — COMPREHENSIVE METABOLIC PANEL
ALT: 21 U/L (ref 0–35)
AST: 21 U/L (ref 0–35)
Albumin: 4 g/dL (ref 3.5–5.2)
Alk Phos: 65 U/L (ref 35–105)
Anion Gap: 12 (ref 7–16)
Bilirubin,Total: 0.2 mg/dL (ref 0.0–1.2)
CO2: 22 mmol/L (ref 20–28)
Calcium: 9.7 mg/dL (ref 8.8–10.2)
Chloride: 102 mmol/L (ref 96–108)
Creatinine: 0.49 mg/dL — ABNORMAL LOW (ref 0.51–0.95)
GFR,Black: 146 *
GFR,Caucasian: 127 *
Glucose: 119 mg/dL — ABNORMAL HIGH (ref 60–99)
Lab: 12 mg/dL (ref 6–20)
Potassium: 4.5 mmol/L (ref 3.3–5.1)
Sodium: 136 mmol/L (ref 133–145)
Total Protein: 7.2 g/dL (ref 6.3–7.7)

## 2020-03-28 LAB — CBC AND DIFFERENTIAL
Baso # K/uL: 0 10*3/uL (ref 0.0–0.1)
Basophil %: 0.4 %
Eos # K/uL: 0.1 10*3/uL (ref 0.0–0.4)
Eosinophil %: 1.1 %
Hematocrit: 41 % (ref 34–45)
Hemoglobin: 13 g/dL (ref 11.2–15.7)
IMM Granulocytes #: 0 10*3/uL (ref 0.0–0.0)
IMM Granulocytes: 0.4 %
Lymph # K/uL: 2.7 10*3/uL (ref 1.2–3.7)
Lymphocyte %: 49.7 %
MCH: 30 pg (ref 26–32)
MCHC: 32 g/dL (ref 32–36)
MCV: 94 fL (ref 79–95)
Mono # K/uL: 0.3 10*3/uL (ref 0.2–0.9)
Monocyte %: 5.9 %
Neut # K/uL: 2.3 10*3/uL (ref 1.6–6.1)
Nucl RBC # K/uL: 0 10*3/uL (ref 0.0–0.0)
Nucl RBC %: 0 /100 WBC (ref 0.0–0.2)
Platelets: 137 10*3/uL — ABNORMAL LOW (ref 160–370)
RBC: 4.3 MIL/uL (ref 3.9–5.2)
RDW: 14.6 % — ABNORMAL HIGH (ref 11.7–14.4)
Seg Neut %: 42.5 %
WBC: 5.4 10*3/uL (ref 4.0–10.0)

## 2020-03-28 LAB — ANAEROBIC CULTURE

## 2020-04-04 ENCOUNTER — Encounter: Payer: Self-pay | Admitting: Gastroenterology

## 2020-04-04 ENCOUNTER — Ambulatory Visit: Payer: MEDICAID | Admitting: Surgery

## 2020-04-05 ENCOUNTER — Ambulatory Visit: Payer: MEDICAID | Attending: Trauma Surgery | Admitting: Trauma Surgery

## 2020-04-05 VITALS — BP 133/70 | HR 94 | Temp 97.0°F | Resp 16

## 2020-04-05 DIAGNOSIS — L02419 Cutaneous abscess of limb, unspecified: Secondary | ICD-10-CM | POA: Insufficient documentation

## 2020-04-05 NOTE — Progress Notes (Signed)
Subjective:       Cindy Ochoa presents to the clinic 2 weeks following incision and drainage of axillary abscess. The patient was discharged on oral antibiotics, and has since finished these. Has been receiving home care for her wound. No other concerns at this time. Eating a regular diet without difficulty. Bowel movements are Normal.  The patient is not having any pain. Hoping that we no longer need to pack the area, as she feels it is well healed. Would like to be done with home care nursing.      Objective:      BP 133/70 (BP Location: Right arm, Patient Position: Sitting, Cuff Size: adult)    Pulse 94    Temp 36.1 C (97 F) (Temporal)    Resp 16    SpO2 100%     General:  alert, appears stated age, cooperative and no distress       Incision:   healing well, no drainage, no erythema, no hernia, no seroma, no swelling, no dehiscence, incision well approximated       Assessment:     Doing well s/p incision and drainage of axillary abscess. Continue local wound care, wound healing well.      Plan:      1. Continue any current medications.  2. Wound care discussed.  3. Pt is to increase activities as tolerated.  4. Follow up: as needed.     Girard Cooter, [place]

## 2020-04-10 ENCOUNTER — Other Ambulatory Visit: Payer: Self-pay

## 2020-04-11 ENCOUNTER — Telehealth: Payer: Self-pay | Admitting: Internal Medicine

## 2020-04-11 NOTE — Telephone Encounter (Signed)
Copied from [place] [id number]. Topic: Access to Care - Speak to Provider/Office Staff  >> Apr 11, 2020  8:10 AM Cindy Ochoa wrote:  Patient reports nasal congestion since Saturday and now can't her out of her left ear.  She has no appetite.      Patient contact number is [phone]

## 2020-04-11 NOTE — Telephone Encounter (Signed)
Unable to reach patient. Left generic message with call back number on patients voicemail.

## 2020-04-12 NOTE — Telephone Encounter (Signed)
Called patient at this time.   Left message with call back number.

## 2020-04-22 ENCOUNTER — Encounter: Payer: Self-pay | Admitting: Physical Medicine and Rehabilitation

## 2020-04-22 ENCOUNTER — Ambulatory Visit: Payer: MEDICAID | Attending: Physical Medicine and Rehabilitation | Admitting: Physical Medicine and Rehabilitation

## 2020-04-22 VITALS — Temp 96.8°F | Ht 65.0 in | Wt 140.0 lb

## 2020-04-22 DIAGNOSIS — G8253 Quadriplegia, C5-C7 complete: Secondary | ICD-10-CM | POA: Insufficient documentation

## 2020-04-22 MED ORDER — ONABOTULINUMTOXINA 100 UNIT IJ SOLR *I*
100.0000 [IU] | Freq: Once | INTRAMUSCULAR | Status: AC | PRN
Start: 2020-04-22 — End: 2020-04-22
  Administered 2020-04-22: 100 [IU] via INTRAMUSCULAR

## 2020-04-22 NOTE — Procedures (Signed)
PMR Botox Injections  Performed by: Alena Bills, MD  Authorized by: Alena Bills, MD     Date/time: 04/22/2020 11:30 AM EDT  Injections:  [id number] - Chemodenervation 1 ext 1-4 muscles and [id number] - Chemodenervation 1 ext 1-4 muscles, 1st addtl  Modifiers:  LT and RT  Medication:  100 units botulinum toxin type A 100 units; 100 units botulinum toxin type A 100 units; 100 units botulinum toxin type A 100 units; 100 units botulinum toxin type A 100 units; 100 units botulinum toxin type A 100 units; 100 units botulinum toxin type A 100 units   Botulinum Toxin Injection Procedure Note     Pre-operative Diagnosis: 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     The area for injection was identified and a time out called to re-identify.      The correct patient was identified with 2 identifiers The correct procedure, location site(s) and laterality were identified with the patient and/or guardian and correspond to the consent form  The appropriate site was marked and verified.  The patient was placed in the correct position.    After prepping the skin with alcohol overlying the following muscles, botlinum toxin was injected intramuscularly as follows.     Right and left quadriceps (vastus lateralis 75, vastus medialis 75, vastus intermedius 75, rectus femoris 75) 300 + 300 units      Botulinum toxin Lot #: [id number]  Botulinum toxin expiration date: 06/2022  Total botox units injected: 600 units   Total botox units wasted: 0 units     The patient tolerated the procedure without complications.    Patient will follow up in 12 weeks for repeat botox injection       Alena Bills, MD

## 2020-04-25 ENCOUNTER — Ambulatory Visit: Payer: MEDICAID | Admitting: Student in an Organized Health Care Education/Training Program

## 2020-04-25 ENCOUNTER — Encounter: Payer: Self-pay | Admitting: Gastroenterology

## 2020-04-26 ENCOUNTER — Encounter: Payer: Self-pay | Admitting: Internal Medicine

## 2020-04-26 DIAGNOSIS — N3941 Urge incontinence: Secondary | ICD-10-CM

## 2020-04-26 MED ORDER — NON-SYSTEM MEDICATION *A*
5 refills | Status: DC
Start: 2020-04-26 — End: 2020-11-14

## 2020-04-26 MED ORDER — NON-SYSTEM MEDICATION *A*
5 refills | Status: AC
Start: 2020-04-26 — End: ?

## 2020-04-26 NOTE — Telephone Encounter (Signed)
Printed script to fax to [facility].    Ezequiel Ganser, MD  Internal Medicine PGY-2

## 2020-05-02 ENCOUNTER — Ambulatory Visit: Payer: MEDICAID | Admitting: Student in an Organized Health Care Education/Training Program

## 2020-05-06 ENCOUNTER — Telehealth: Payer: Self-pay | Admitting: Dermatology

## 2020-05-06 NOTE — Telephone Encounter (Signed)
Called patient to discuss. Pt states she has some swelling on back of her neck and that she sent a photo to [place] through [facility] last time this happened and we gave her some antibiotics. The swelling is now back and she is wondering if she should make an appointment. Advised pt that it would likely be best for her to make an appointment to be evaluated in person as this is an issue we have not physically evaluated yet. Pt in agreement with plan. Will route to front desk to schedule with Dr. Nyoka Cowden, next available.

## 2020-05-06 NOTE — Telephone Encounter (Signed)
Copied from [place] [id number]. Topic: Access to Care - Speak to Provider/Office Staff  >> May 06, 2020  8:44 AM Noel Rodier, Ester Rink wrote:  Patient is calling in regards to a knot that has returned back on her neck due to sweating alot, she's requested a refill for more antibiotics. Please send to [facility]     Last seen in offfice on 12/08/19    Patient can be reached at [phone]

## 2020-05-07 NOTE — Telephone Encounter (Signed)
Cindy Ochoa is calling to schedule an appointment.    Appointment Type: fuv  Provider: green  What does patient need to be seen for?: swelling on back of neck  Preferred location?: N/A   Type of insurance: medicaid      Is there a referral on file?: No  Were the demographics and insurance verified?: yes  Was the patient connected to [facility] if applicable?: No  Was the appropriate expectation set with the patient for a time frame to receive a return call from the office? Yes    Patient can be reached at: [phone]

## 2020-05-08 ENCOUNTER — Telehealth: Payer: Self-pay | Admitting: Internal Medicine

## 2020-05-08 NOTE — Telephone Encounter (Addendum)
Called mother back to discuss. Mom states patient needs antibiotic that she got when she got her procedure done because she has swelling to the point where she can't turn her neck. Mom states that she saw [place] for this in the past and that we performed the procedure and we prescribed antibiotics. On chart review, it appears that pt did not see [place] for this issue; she saw general surgery Sharlett Iles, [place]) on 04/05/2020. They were the ones who prescribed antibiotics and I&Ded the lesion. I explained to mom that it would probably be best for patient to follow up with them as they were the ones who saw her for this and treated it previously. (We have seen her for lesions on her knees but have never evaluated this lesion). I gave mom Leota Sauers name and a phone number I was able to find online (although not entirely sure this is the correct one). I explained we would be happy to see her to evaluate but we would likely need to see her in the office to get an idea of what is going on or at the very least evaluate a picture via MyChart. I will also reach out to [facility] via [place] (routing this note) to make him aware. Mom appreciated the call.    Charlestine Night, MD  PGY-2  Dermatology Resident
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
## 2020-05-08 NOTE — Telephone Encounter (Signed)
Copied from [place] [id number]. Topic: Access to Care - Speak to Provider/Office Staff  >> May 08, 2020  1:47 PM Verda Cumins wrote:  [name] (mother) reports her daughter had an abscess drained last month.  She states now her daughter shoulder blade and neck are swollen, looks like something is coming to a head, now patient can't move her head and they think she has an infection.  Patient has been sweating    They don't have anyway to get patient to [facility] or get her to the hospital because they don't have the funds for transportation.    Denise's contact number is [phone]    Patient contact number is [phone]

## 2020-05-08 NOTE — Telephone Encounter (Unsigned)
Copied from [place] [id number]. Topic: Medications/Prescriptions - Medication Question/Problem  >> May 08, 2020  8:54 AM Harland Dingwall wrote:  Mom [name] calling about giving the patient some medication to help her.   Please call Mom at [phone].

## 2020-05-08 NOTE — Telephone Encounter (Signed)
Called pt re: "absess"    Pt states she does not know why her mom called SIM. She states that she has been sweating a lot and derm is following. She has a sore spot on her neck: swelling has gone down and is not painful currently.     Advised to wait for derm to call back today or early tomorrow. If they do not get back to her to call again. Also to send a photo via mychart to [employer] [employer] office in the meantime. Pt verbalizes understanding.

## 2020-05-09 ENCOUNTER — Encounter: Payer: Self-pay | Admitting: Dermatology

## 2020-05-09 NOTE — Telephone Encounter (Signed)
Patient was called and left detailed message on upcoming appointment for 05/16/20 at 11:00AM

## 2020-05-10 ENCOUNTER — Encounter: Payer: Self-pay | Admitting: Student in an Organized Health Care Education/Training Program

## 2020-05-10 ENCOUNTER — Emergency Department
Admission: EM | Admit: 2020-05-10 | Discharge: 2020-05-11 | Disposition: A | Discharge status: Home / Self Care | Payer: MEDICAID | Source: Ambulatory Visit | Attending: Student in an Organized Health Care Education/Training Program | Admitting: Student in an Organized Health Care Education/Training Program

## 2020-05-10 DIAGNOSIS — Z87891 Personal history of nicotine dependence: Secondary | ICD-10-CM | POA: Insufficient documentation

## 2020-05-10 DIAGNOSIS — L02211 Cutaneous abscess of abdominal wall: Secondary | ICD-10-CM

## 2020-05-10 DIAGNOSIS — L0291 Cutaneous abscess, unspecified: Secondary | ICD-10-CM

## 2020-05-10 DIAGNOSIS — L0211 Cutaneous abscess of neck: Secondary | ICD-10-CM | POA: Insufficient documentation

## 2020-05-10 DIAGNOSIS — M542 Cervicalgia: Secondary | ICD-10-CM

## 2020-05-10 DIAGNOSIS — R61 Generalized hyperhidrosis: Secondary | ICD-10-CM

## 2020-05-10 LAB — CBC AND DIFFERENTIAL
Baso # K/uL: 0 10*3/uL (ref 0.0–0.1)
Basophil %: 0.1 %
Eos # K/uL: 0.1 10*3/uL (ref 0.0–0.4)
Eosinophil %: 0.7 %
Hematocrit: 42 % (ref 34–45)
Hemoglobin: 13.4 g/dL (ref 11.2–15.7)
IMM Granulocytes #: 0 10*3/uL (ref 0.0–0.0)
IMM Granulocytes: 0.3 %
Lymph # K/uL: 2.9 10*3/uL (ref 1.2–3.7)
Lymphocyte %: 39.1 %
MCH: 31 pg (ref 26–32)
MCHC: 32 g/dL (ref 32–36)
MCV: 96 fL — ABNORMAL HIGH (ref 79–95)
Mono # K/uL: 0.5 10*3/uL (ref 0.2–0.9)
Monocyte %: 6.8 %
Neut # K/uL: 3.9 10*3/uL (ref 1.6–6.1)
Nucl RBC # K/uL: 0 10*3/uL (ref 0.0–0.0)
Nucl RBC %: 0 /100 WBC (ref 0.0–0.2)
Platelets: 118 10*3/uL — ABNORMAL LOW (ref 160–370)
RBC: 4.4 MIL/uL (ref 3.9–5.2)
RDW: 14.6 % — ABNORMAL HIGH (ref 11.7–14.4)
Seg Neut %: 53 %
WBC: 7.4 10*3/uL (ref 4.0–10.0)

## 2020-05-10 LAB — PLASMA PROF 7 (ED ONLY)
Anion Gap,PL: 12 (ref 7–16)
CO2,Plasma: 22 mmol/L (ref 20–28)
Chloride,Plasma: 107 mmol/L (ref 96–108)
Creatinine: 0.4 mg/dL — ABNORMAL LOW (ref 0.51–0.95)
GFR,Black: 156 *
GFR,Caucasian: 136 *
Glucose,Plasma: 103 mg/dL — ABNORMAL HIGH (ref 60–99)
Potassium,Plasma: 3.7 mmol/L (ref 3.3–4.6)
Sodium,Plasma: 141 mmol/L (ref 133–145)
UN,Plasma: 11 mg/dL (ref 6–20)

## 2020-05-10 LAB — HOLD GRAY

## 2020-05-10 LAB — HOLD SST

## 2020-05-10 LAB — POCT GLUCOSE: Glucose POCT: 120 mg/dL — ABNORMAL HIGH (ref 60–99)

## 2020-05-10 MED ORDER — LET SOLUTION (LIDOCAINE/EPINEPHRINE/TETRACAINE) 1%-0.025%-0.5% *WRAPPED*
3.0000 mL | Freq: Once | TOPICAL | Status: AC
Start: 2020-05-10 — End: 2020-05-10
  Administered 2020-05-10: 3 mL via TOPICAL
  Filled 2020-05-10: qty 3

## 2020-05-10 MED ORDER — LIDOCAINE-EPINEPHRINE 1 %-1:100000 IJ SOLN *I*
10.0000 mL | Freq: Once | INTRAMUSCULAR | Status: AC
Start: 2020-05-10 — End: 2020-05-11
  Administered 2020-05-11: 10 mL via INTRADERMAL
  Filled 2020-05-10: qty 20

## 2020-05-10 MED ORDER — FENTANYL CITRATE 50 MCG/ML IJ SOLN *WRAPPED*
75.0000 ug | Freq: Once | INTRAMUSCULAR | Status: AC
Start: 2020-05-10 — End: 2020-05-11
  Administered 2020-05-11: 01:00:00 75 ug via INTRAVENOUS
  Filled 2020-05-10: qty 2

## 2020-05-10 MED ORDER — OXYCODONE HCL 5 MG PO TABS *I*
5.0000 mg | ORAL_TABLET | Freq: Once | ORAL | Status: AC
Start: 2020-05-10 — End: 2020-05-10
  Administered 2020-05-10: 5 mg via ORAL
  Filled 2020-05-10: qty 1

## 2020-05-10 NOTE — ED Notes (Signed)
Writer responded to pt's call-light. Pt found to be profusely diaphoretic. Pt denies new symptoms such as chest pain, SOB, dizziness. POCT Bg 120, BP 120/80, HR noted to be tachy in the 120s. Oral temp 98.0. Provider Pecheny at bedside to assess pt.

## 2020-05-10 NOTE — ED Notes (Signed)
WAITING ROOM CHECK    Patient visualized in waiting room. Patient appears in no acute distress. Patient's chart reviewed and most recent vital signs reviewed. Will continue to monitor.

## 2020-05-10 NOTE — ED Notes (Signed)
Pt appears more comfortably, improvement in diaphoresis noted.
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
## 2020-05-10 NOTE — ED Triage Notes (Signed)
Notes swelling and pain to back of neck x 1 week.  Admitted for an abscess of the same area last week.  Diaphoretic in triage.  Hx of quadriplegia.         Triage Note   Aundra Millet, RN
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
## 2020-05-10 NOTE — First Provider Contact (Signed)
ED First Provider Contact Note    Initial provider evaluation performed by   ED First Provider Contact     Date/Time Event User Comments    05/10/20 1223 ED First Provider Contact Yisell Sprunger Initial Face to Face Provider Contact          Vital signs reviewed.    34 y.o. female PMH quadriparesis, wheelchair bound present to the ED with complaint of pain/swelling to left side of neck. States she has begun sweating which indicates her infection is worse.   Admitted from 3/23-3/25 for axillary abscess, underwent I&D. Completed 7 day course of Bactrim afterwards.   Assessment: Wound    Orders placed:  LABS     Patient requires further evaluation.     Rondall Allegra, PA, 05/10/2020, 12:23 PM     Rondall Allegra, PA  05/10/20 1225

## 2020-05-11 ENCOUNTER — Other Ambulatory Visit: Payer: Self-pay

## 2020-05-11 LAB — GRAM STAIN

## 2020-05-11 MED ORDER — SULFAMETHOXAZOLE-TRIMETHOPRIM 800-160 MG PO TABS *I*
1.0000 | ORAL_TABLET | Freq: Once | ORAL | Status: AC
Start: 2020-05-11 — End: 2020-05-11
  Administered 2020-05-11: 1 via ORAL
  Filled 2020-05-11: qty 1

## 2020-05-11 MED ORDER — SULFAMETHOXAZOLE-TRIMETHOPRIM 800-160 MG PO TABS *I*
1.0000 | ORAL_TABLET | Freq: Two times a day (BID) | ORAL | 0 refills | Status: AC
Start: 2020-05-11 — End: 2020-05-16
  Filled 2020-05-11: qty 10, 5d supply, fill #0

## 2020-05-11 NOTE — ED Notes (Signed)
Assuming pt care, received report from previous RN. Pt visualized to be in NAD, will continue to monitor and treat per MD orders.

## 2020-05-11 NOTE — Discharge Instructions (Addendum)
You were seen here for a neck abscess that was incised and drained. A loop was placed to keep the area open. Keep the area clean with warm water and soap. Throughout the day, move the loop around a little bit to keep it loosened and the area draining. You were placed on a 5 day course of antibiotics. Follow up with your primary care physician this coming week.  Return if you develop a fever, chills, vomiting, or worsened pain

## 2020-05-11 NOTE — ED Notes (Signed)
Pt discharge instructions reviewed. Pt comfortable with discharge planning and verbalizes understanding of discharge instruction. Pt ambulatory without assistance and steady gait. Tolerated PO. Pt will follow up with PCP. Mother picking up pt. Pt belongings with pt. Pt safe to discharge at this time.

## 2020-05-11 NOTE — ED Notes (Signed)
Pt repositioned for comfort, pillow placed under bilateral heels.

## 2020-05-11 NOTE — ED Procedure Documentation (Addendum)
Procedures   Ultrasound - Musculoskeletal/Soft Tissue  Performed by: Duayne Cal, DO  Authorized by: Liam Graham, MD       Procedure details:     Indications: suspected skin / soft tissue infection      Assessment for: abscess      Structures Visualized:   Head / Neck: neck  :  :  :  :  :    Findings:     Fluid collection (measured size): visualized      Impression:        Present: abscess                  Images were interpreted by me and archived to [facility] PACS.          Duayne Cal, DO     Duayne Cal, DO  Resident  05/11/20 [phone]    Ultrasound Procedure: I supervised the procedure, and was immediately available during the procedure.    Images: Images were reviewed and interpreted by me and I agree with the resident interpretation as documented.    Liam Graham, MD as of 05/12/2020 at 5:14 PM       Liam Graham, MD  05/12/20 1714

## 2020-05-11 NOTE — ED Procedure Documentation (Addendum)
Procedures   Incision and drainage  Performed by: Duayne Cal, DO  Authorized by: Liam Graham, MD     Consent:     Consent obtained:  Verbal    Consent given by:  Parent    Risks discussed:  Bleeding, pain, incomplete drainage and infection    Alternatives discussed:  No treatment  Universal protocol:     Patient identity confirmed:  Verbally with patient  Location:     Type:  Abscess    Size:  4x4    Location:  Neck    Neck location:  L posterior  Pre-procedure details:     Skin preparation:  Chloraprep  Anesthesia (see MAR for exact dosages):     Anesthesia method:  Local infiltration    Local anesthetic:  Lidocaine 1% WITH epi  Procedure details:     Needle aspiration: no      Incision types:  Stab incision    Incision depth:  Subfascial    Scalpel blade:  15    Wound management:  Irrigated with saline and probed and deloculated    Drainage:  Purulent    Drainage amount:  Copious    Wound treatment:  Loop vessel    Packing materials:  None  Post-procedure details:     Patient tolerance of procedure:  Tolerated well, no immediate complications        Duayne Cal, DO     Duayne Cal, DO  Resident  05/11/20 [phone]    I was present and participated during the critical and key portions and was immediately available during the remainder of the procedure.    Liam Graham, MD       Liam Graham, MD  05/11/20 1147

## 2020-05-13 ENCOUNTER — Encounter: Payer: Self-pay | Admitting: Internal Medicine

## 2020-05-13 LAB — AEROBIC CULTURE: Aerobic Culture: 0

## 2020-05-16 ENCOUNTER — Ambulatory Visit: Payer: MEDICAID | Attending: Dermatology | Admitting: Dermatology

## 2020-05-16 ENCOUNTER — Other Ambulatory Visit: Payer: Self-pay

## 2020-05-16 VITALS — Temp 98.0°F | Ht 60.0 in | Wt 148.0 lb

## 2020-05-16 DIAGNOSIS — L732 Hidradenitis suppurativa: Secondary | ICD-10-CM | POA: Insufficient documentation

## 2020-05-16 DIAGNOSIS — I776 Arteritis, unspecified: Secondary | ICD-10-CM | POA: Insufficient documentation

## 2020-05-16 DIAGNOSIS — L0291 Cutaneous abscess, unspecified: Secondary | ICD-10-CM | POA: Insufficient documentation

## 2020-05-16 MED ORDER — CLINDAMYCIN PHOSPHATE 1 % EX LOTN *I*
TOPICAL_LOTION | CUTANEOUS | 5 refills | Status: DC
Start: 2020-05-16 — End: 2020-05-17
  Filled 2020-05-16: qty 60, 30d supply, fill #0

## 2020-05-16 NOTE — Progress Notes (Signed)
Referring Provider: No ref. provider found   PCP: Thornell Sartorius, MD     Chief complaint: Follow-up (wound on neck recheck)    Derm/Relevent Medical History:   - Autonomic dysfunction  - Muscle spasticity  - Osteoporosis  - Quadriplegic at C6  - EED as below- inflammatory     HPI:   Cindy Ochoa is a pleasant 34 y.o. female here for the concern noted above.     Last seen: 11/2019 for lesions on knee- bx suggestive of EED   Concerns:   1. Lesions on knee- bx 12/202 and suggestive of EED. Much improved. Not applying anything to it. Asymptomatic.    2. Abscess, neck- drained at ED this past weekend and much improved. Culture negative. On bactrim.   3. Boils under arm- come and go. Not using anything for them.     Personal hx of skin cancer: no  Family hx of skin cancer: no  Social History: Patient lives at home with her mother.  Smoking History:  reports that she quit smoking about 3 years ago. Her smoking use included cigarettes. She started smoking about 8 years ago. She has a 0.75 pack-year smoking history. She quit smokeless tobacco use about 15 years ago.        Physical Exam:    Vitals:    05/16/20 1105   Temp: 36.7 C (98 F)   Weight: 67.1 kg (148 lb)   Height: 1.524 m (5')      Pain    05/16/20 1105   PainSc:   0 - No pain      General: Appears well, NAD  Orientation: Alert and oriented   Mood/Affect: Normal affect  Skin: All of the following were examined, and were within normal limits, except as noted: Extremities (RLE/LLE)  - Bilateral knees: left knee with pink plaque with central ulceration- much improved from previous, smaller but similar lesion on right medial knee, depigmented patches of scar tissue on right knee   -bilateral axillae with hyperpigmented macules and papule and scarring   -left posterior neck with tender nodule with no active drainage       Assessment/Plan:   Small vessel vasculitis, 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 Bx  11/2019 most suggestive of EED. HIV 07/2018 negative. CRP within normal limits, EED slightly elevated   -- DDx: Discoid Lupus, Lichen sclerosus et atrophicus.  -- Awaiting biopsy results, suture removed today  -- the eruption is significantly improved from eConsult photo and from prior visit. Unlikely to be a complication of botox injections.    -- Start betamethasone dipropionate ointment twice daily as needed to the itchy, red, and scaly affected areas on the body   -Do not use on face, underarms, or groin   -Side effects: lightening or thinning of the skin, especially if used on areas where you don't have rash     Hidradenitis suppurativa, mild, bilateral axilla   -Discussed the diagnosis and treatment options    - START clindamycin lotion to the affected skin daily     Abscess, neck, s/p I+D in the ED (culture negative), on bactrim    - Discussed the diagnosis and treatment options    - discussed that this may be related to Hidradenitis suppurativa since culture is negative   - complete bactrim course as prescribed    - START clindamycin lotion to the affected area daily       Barriers to learning: None  Return to Clinic: 3 months     Seen and discussed with Dr. Coralie Carpen, MD   Dermatology Resident   PGY-3      Moderate MDM (2/3):    Problems:  [x]  1 or more chronic illnesses with exacerbation, progression or side effects of treatment  []  2 or more stable chronic illnesses   []  1 undiagnosed new problem with uncertain prognosis   []  1 acute illness with systemic symptoms   []  1 acute complicated injury    Data:   []  Any combination of 3 from the following:   Review of prior external notes from each unique source  Review of results from each unique test  Ordering of unique test  Assessment requiring an independent historian   []  Independent interpretation of a test performed by another physician/other qualified health care professional (not separately reported)  []  Discussion of management or test  interpretation with external physician/other qualified health care professional\appropriate source    Risk:  [x]  Prescription drug management  []  Decision regarding minor surgery with identified patient or procedure risk factors  []  Decision regarding elective major surgery without identified patient or procedure risk factors  []  Diagnosis or treatment significantly limited by social determinants of health

## 2020-05-17 ENCOUNTER — Other Ambulatory Visit: Payer: Self-pay

## 2020-05-17 ENCOUNTER — Ambulatory Visit: Payer: MEDICAID | Admitting: Internal Medicine

## 2020-05-17 NOTE — Telephone Encounter (Signed)
CLINDAMYCIN (CLEOCIN T) 1% Lotion was rejected. Prior Josem Kaufmann was required.

## 2020-05-18 ENCOUNTER — Other Ambulatory Visit: Payer: Self-pay

## 2020-05-21 ENCOUNTER — Other Ambulatory Visit: Payer: Self-pay

## 2020-05-21 MED ORDER — CLINDAMYCIN PHOSPHATE 1 % EX LOTN *I*
TOPICAL_LOTION | CUTANEOUS | 5 refills | Status: DC
Start: 2020-05-21 — End: 2020-10-16
  Filled 2020-05-21: qty 60, 30d supply, fill #0

## 2020-05-25 ENCOUNTER — Other Ambulatory Visit: Payer: Self-pay

## 2020-05-29 ENCOUNTER — Encounter: Payer: Self-pay | Admitting: Internal Medicine

## 2020-05-30 ENCOUNTER — Telehealth: Payer: Self-pay | Admitting: Internal Medicine

## 2020-05-30 NOTE — Telephone Encounter (Signed)
Copied from [facility]. Topic: Access to Care - Speak to Provider/Office Staff  >> May 30, 2020  9:30 AM Brendia Sacks wrote:  Lorna Dibble -[facility] is working on her CDPAS service.    Mattie is looking for her primary diagnosis.    Copy of the original order that would have been sent to [facility].    Mattie can be reached at [phone], if needed.

## 2020-05-30 NOTE — Telephone Encounter (Addendum)
Clinical care management : Phone contact attempted to [facility] at [facility] and message left to call writer back. Patient to attend SIM appt on 6/10 and plan is for [facility] CDPAS/aide form to be completed at that time and sent to [facility] which will include medical diagnosis.     Call back from Maddy from [place] advising that patient now has [facility] as managed medicaid and will continue with same CDPAS aide hours 126 / week at this time. ICD 10 diagnostic codes reviewed and will need to send new [facility] CDPAs/aide order form after next appt 06/06/20. Will request information to be added if any change in condition. Will need to fax form to [place] at 1 [phone] .

## 2020-05-31 ENCOUNTER — Telehealth: Payer: Self-pay | Admitting: Internal Medicine

## 2020-05-31 ENCOUNTER — Encounter: Payer: Self-pay | Admitting: Surgery

## 2020-05-31 NOTE — Telephone Encounter (Signed)
Please get her on Lisa's schedule for next available   Thank you   Jenny Reichmann, NP

## 2020-05-31 NOTE — Telephone Encounter (Signed)
Copied from [place] [id number]. Topic: Access to Care - Labs/Orders/Imaging  >> May 31, 2020 11:23 AM Crista Elliot wrote:  Apolonio Schneiders a case work from [employer] called for [employer] stating the patient  was enrolled to case management as of today.      Requesting copies of last off visit notes, current medications, diagnosis/problem list, and allergies to please be fax to [phone] att: Apolonio Schneiders.    If any questions Apolonio Schneiders can be reached at [phone]

## 2020-05-31 NOTE — Telephone Encounter (Signed)
Requested documentation faxed for Apolonio Schneiders at [facility] on 05/31/20

## 2020-06-06 ENCOUNTER — Other Ambulatory Visit: Payer: Self-pay

## 2020-06-06 ENCOUNTER — Ambulatory Visit: Payer: Medicaid Other | Admitting: Internal Medicine

## 2020-06-06 ENCOUNTER — Encounter: Payer: Self-pay | Admitting: Internal Medicine

## 2020-06-06 ENCOUNTER — Encounter: Payer: Self-pay | Admitting: Gastroenterology

## 2020-06-06 VITALS — BP 138/64 | HR 89 | Temp 97.2°F | Ht 65.0 in

## 2020-06-06 DIAGNOSIS — F329 Major depressive disorder, single episode, unspecified: Secondary | ICD-10-CM

## 2020-06-06 DIAGNOSIS — F32A Depression, unspecified: Secondary | ICD-10-CM

## 2020-06-06 DIAGNOSIS — L0291 Cutaneous abscess, unspecified: Secondary | ICD-10-CM

## 2020-06-06 DIAGNOSIS — R911 Solitary pulmonary nodule: Secondary | ICD-10-CM

## 2020-06-06 MED ORDER — CEPHALEXIN 500 MG PO CAPS *I*
500.0000 mg | ORAL_CAPSULE | Freq: Two times a day (BID) | ORAL | 0 refills | Status: DC
Start: 2020-06-06 — End: 2020-06-27
  Filled 2020-06-06: qty 10, 5d supply, fill #0

## 2020-06-06 NOTE — Progress Notes (Signed)
Reason For Visit: The primary encounter diagnosis was Abscess. Diagnoses of Pulmonary nodule and Depression, unspecified depression type were also pertinent to this visit.      HPI:  Cindy Ochoa is 34 y.o. year old female with autonomic dysfunction, quadriplegic at C6, thrombocytopenia, neurogenic bladder, neurogenic bowel    Patient was seen in the emergency room on May 14  I&D to left neck due to abscess  She was placed on Keflex for 5 days  Patient was seen afterwards by Derm and prescribed clindamycin cream  Currently not using  Patient feels abscess is feeling back up again  She denies any fever chills    CT angio last year shows pulmonary nodule  Patient does smoke intermittently  Discussed with patient  She would like repeat CT this year    Patient is an [place] in since 2017    Currently gets 126 hours/week of aide service  She switched insurance and needs new cdpas paperwork completed    Patient is followed by wound care for sacral decub    Previous history of depression reports mood is good    Patient is taking about Covid vaccine    Medications:     Current Outpatient Medications   Medication Sig    clindamycin (CLEOCIN T) 1 % lotion Apply to affected skin on neck and under arms 1-2 times daily.    Non-System Medication Briefs/pullups, large  Ht: 5\' 5"  Wt: 143lbs  ICD code: N39.41    Non-System Medication Large underpads  Ht: 5\' 5"  Wt: 143lbs  ICD code: N39.41    bisacodyl (BISAC-EVAC) 10 MG suppository Place 1 suppository (10 mg total) rectally daily as needed    docusate sodium (COLACE) 100 MG capsule Take 1 capsule (100 mg total) by mouth 2 times daily    melatonin 3 MG Take 1 tablet (3 mg total) by mouth nightly as needed for Sleep    generic DME 1/2 inch plain packing gauze    generic DME Dispense: ROHO  Indication: Quadriplegia   ICD-10: R53.2  Duration: Lifetime  Ht Readings from Last 1 Encounters:  03/19/20 : 1.626 m (5\' 4" )   Wt Readings from Last 1 Encounters:  03/19/20 : 63.5 kg (140  lb)    hydrocortisone (ANUSOL-HC) 25 MG suppository Place 1 suppository rectally  every other night    Non-System Medication Gloves  Ht: 5\' 5"  Wt: 143lbs  ICD code: N39.41    Non-System Medication Chux pads  Ht: 5\' 5"  Wt: 143lbs  ICD code: N39.41    disposable underpads 30"x36" (CHUX) Use 6 times daily and PRN. Dx N39.42  Incontinence without sensory awareness    incontinence supply disposable Large pull ups - use up to 5 x daily  Dx N39.46    disposable gloves 1 box Dynarex PF Vinyl Gloves    patient lift Use as directed for patient lifting. For lifetime use; ICD 10: G82.54 Ht: 1.20m Wt: 76.2 kg    adjustable bath/shower seat with back For lifetime use; ICD 10: G82.54 Ht: 1.58m Wt: 76.2 kg    Non-System Medication Urostomy drainage bags 2000 cc change as needed. Dx N39.46 and  G82.54    generic DME Wafers  Dx code: N31.9  Qty: 10  Refills: 6    generic DME Urostomy Pouches  Dx Codes(s): N31.9  Qty: 20  Refills: 6    generic DME Leg bag  Dx Code: N31.9  Qty: 4  Refills: 6    generic DME Night drainage bag  Dx Code: N31.9  Qty: 4  Refills: 6    generic DME Barrier rings  Dx Code: N31.9  Qty: 15  Refills: 6    Ostomy Supplies MISC Coloplast Urostomy two piece bag and wafer 1 1/4 "H  7/8 "V N31.9 Neuromuscular dysfunction of the bladder    Ostomy Supplies [facility] Coloplast Urostomy two piece bag and wafer 1 1/4 "H  7/8 "V N31.9 Neuromuscular dysfunction of the bladder. Use as directed.    Ostomy Supplies Pouch [employer] Urostomy two piece bag and wafer 1 1/4 "H  7/8 "V N31.9 Neuromuscular dysfunction of the bladder Use as directed    Ostomy Supplies MISC Barrier ring. Use as directed.    generic DME Repairs to hospital bed/hoyer lift  ICD 10: G82.54   Ht: 1.31m Wt: 76.2 kg    polyethylene glycol (GLYCOLAX) powder Take 17 g by mouth daily   Mix in 8 oz water or juice and drink.    senna-docusate (PERICOLACE) 8.6-50 MG per tablet Take 2 tablets by mouth daily as needed for Constipation     disposable gloves 2 boxes Disposable Medium size gloves N39.46    generic DME Urostomy drainage bags 2000 cc change as needed. Dx N39.46 and  G82.54    Gauze Pads & Dressings (ABDOMINAL PAD) 8"X10" PADS By 1 each no specified route daily   Cover buttock wounds 2x daily    Adhesive Tape (MEDIPORE SURGICAL 2"X10YD) TAPE Secure the 2 buttocks  wound dressings 2x day    Wound Dressings (HYDROFERA BLUE 4"X4") PADS Cut and moisten with saline as directed and pack in to buttock wound daily    Non-System Medication Gel overlay mattress for hospital bed - diagnosis G82.54, L89.93, L89.159    acetaminophen (MAPAP) 500 mg tablet Take 1 tablet (500 mg total) by mouth every 4-6 hours as needed   for pain    Non-System Medication Easy Tip Leg Bags 1000mg  - diagnosis G82.53 N39.46    etonogestrel (NEXPLANON) 68 MG IMPL Inject 68 mg into the skin once   Placed 10/20/16     No current facility-administered medications for this visit.       Medication list reconciled this visit    Allergies:     Allergies   Allergen Reactions    Heparin Other (See Comments)     Thrombocytopenia; HIT    Ibuprofen Swelling     Pt reports lip swelling after taking ibuprofen.     Nitrofurantoin Nausea And Vomiting       Social history      Social History     Tobacco Use    Smoking status: Former Smoker     Packs/day: 0.25     Years: 3.00     Pack years: 0.75     Types: Cigarettes     Start date: 12/09/2011     Quit date: 07/30/2016     Years since quitting: 3.8    Smokeless tobacco: Former Systems developer     Quit date: 04/29/2005    Tobacco comment: quit 07/2019   Substance Use Topics    Alcohol use: Yes       Review of Systems     10 point review of system is negative unless discussed in subjective     Physical Exam:     Physical Exam  Constitutional:       Comments: Patient is in a motorized wheelchair   HENT:      Nose: Nose normal.   Neck:  Comments: Cyst left lateral neck 2 cm  Cardiovascular:      Rate and Rhythm: Normal rate.   Abdominal:       Comments: Urostomy tube intact                  Vitals:    06/06/20 0858   BP: 138/64   BP Location: Left arm   Patient Position: Sitting   Cuff Size: large adult   Pulse: 89   Temp: 36.2 C (97.2 F)   TempSrc: Temporal   SpO2: 100%   Height: 1.651 m (5\' 5" )     Wt Readings from Last 3 Encounters:   05/16/20 67.1 kg (148 lb)   05/10/20 64.4 kg (142 lb)   04/22/20 63.5 kg (140 lb)     BP Readings from Last 3 Encounters:   06/06/20 138/64   05/11/20 92/58   04/05/20 133/70           RESULTS:     Reviewed all pertain lab and xray reports during the visit     ASSESSMENT/PLAN:     Diagnoses and all orders for this visit:      Pulmonary nodule  -     CT chest without contrast; Future    Nexplanon is overdue for follow-up with GYN    Abscess  -     cephalexin (KEFLEX) 500 MG capsule; Take 1 capsule (500 mg total) by mouth every 12 hours  Apply clindamycin cream after completion of Keflex  Warm compresses  If worsening symptoms call dermatology for I&D      Happy to complete Cdpas       Follow up: Return for Follow up with ccp 3 months .     I personally spent 35 minutes  on the calendar day of the encounter, including pre and post visit work.      Patient instructions      There are no Patient Instructions on file for this visit.       Lavone Orn,  Nurse Practitioner  [facility]

## 2020-06-06 NOTE — Patient Instructions (Signed)
[facility]  [address]  [address]  [phone]

## 2020-06-07 ENCOUNTER — Other Ambulatory Visit: Payer: Self-pay

## 2020-06-10 ENCOUNTER — Encounter: Payer: Self-pay | Admitting: Surgery

## 2020-06-11 ENCOUNTER — Telehealth: Payer: Self-pay | Admitting: Student in an Organized Health Care Education/Training Program

## 2020-06-11 NOTE — Telephone Encounter (Signed)
Call came through fax form [place] name: Vikki Ports  Phone: [phone]    Message: Kemp received [facility] form, will process it

## 2020-06-12 ENCOUNTER — Ambulatory Visit: Payer: Medicaid Other | Admitting: Surgery

## 2020-06-17 ENCOUNTER — Encounter: Payer: Self-pay | Admitting: Internal Medicine

## 2020-06-26 ENCOUNTER — Ambulatory Visit
Admission: RE | Admit: 2020-06-26 | Discharge: 2020-06-26 | Disposition: A | Discharge status: Home / Self Care | Payer: Medicaid Other | Source: Ambulatory Visit | Attending: Internal Medicine | Admitting: Internal Medicine

## 2020-06-26 DIAGNOSIS — R9389 Abnormal findings on diagnostic imaging of other specified body structures: Secondary | ICD-10-CM | POA: Insufficient documentation

## 2020-06-26 DIAGNOSIS — R911 Solitary pulmonary nodule: Secondary | ICD-10-CM | POA: Insufficient documentation

## 2020-06-27 ENCOUNTER — Ambulatory Visit: Payer: Medicaid Other | Admitting: Surgery

## 2020-06-27 ENCOUNTER — Other Ambulatory Visit: Payer: Self-pay

## 2020-06-27 VITALS — BP 116/72 | HR 91 | Temp 95.7°F

## 2020-06-27 DIAGNOSIS — G8254 Quadriplegia, C5-C7 incomplete: Secondary | ICD-10-CM

## 2020-06-27 DIAGNOSIS — L89313 Pressure ulcer of right buttock, stage 3: Secondary | ICD-10-CM

## 2020-06-27 MED ORDER — CALMOSEPTINE 0.44-20.6 % EX OINT
TOPICAL_OINTMENT | CUTANEOUS | 6 refills | Status: DC
Start: 2020-06-27 — End: 2021-06-24
  Filled 2020-06-27: qty 113, 30d supply, fill #0

## 2020-06-27 NOTE — Patient Instructions (Addendum)
Please follow these instructions about how to care for your wound.        Please purchase Eucerin cream and moisturize all skin daily. You can purchase the [employer] version called Equate.    Wound Location Dressing Orders Frequency of Dressing Change   Location: Right buttock Calmoseptine  ABD pad Daily and as needed   Location: Between knees Eucerin cream  Knee pads Daily        Cleanse Wound(s) with:   []  Normal Saline  []  Keep Dry    [x]  Soap & Water with dressing changes, keep dressing dry in between          []   Assistive Device(s):   [x]   Wheelchair   []   Walker   []   Cane   []   Crutches      []   Wedge Shoe   []   Other:     []   Pressure Reduction:   []   Wheelchair Cushion   []   Mattress Overlay      []   Specialty Bed   [x]   Reposition every 2 hours         []   Limit time up in your chair to 2 hours or less at a time   []   Other:    []   Home Health:    []   VNS   []   LTC   []   HCR   []   Other:    []   General:    []   Stop / Decrease Smoking   []   Multivitamin   []   Exercise   []   Other:     Covering provider   []   Dr. Marland Mcalpine     []   Discharge from [facility] Discharge Instructions    Following the instructions below will give you the best opportunity for wound healing.    Wound Care Instructions (If changing own dressing):   Gather all supplies you will need to change your dressing.   Before changing your dressing, wash your hands for at least 15 seconds with warm soapy water.   Rinse off all soap, then dry with a towel.   Remove the old dressing.  Wash hands again before applying the new dressing.    If you experience any of the following during our business hours of 8 AM - 4:30 PM, Monday - Friday, please call the [place] at [phone]:    ? Increase in pain  ?Temperature over 101F   ?Drainage with a foul odor   ?Bleeding   ? Increase in swelling  ? Increase in drainage from your wound  ? Need for compression bandage changes (slippage, breakthrough  drainage)    Please contact your primary care physician or proceed to the nearest emergency room if you experience any of the above after our business hours.     Please note your appointment(s) above - if you are unable to keep, kindly give 24 hours' notice.  Thank You!

## 2020-06-28 ENCOUNTER — Telehealth: Payer: Self-pay | Admitting: Internal Medicine
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
## 2020-07-07 ENCOUNTER — Other Ambulatory Visit: Payer: Self-pay

## 2020-07-10 ENCOUNTER — Encounter: Payer: Self-pay | Admitting: Internal Medicine

## 2020-07-10 DIAGNOSIS — R9389 Abnormal findings on diagnostic imaging of other specified body structures: Secondary | ICD-10-CM

## 2020-07-10 NOTE — Telephone Encounter (Signed)
See my chart message    Jaquay Posthumus A Owyn Raulston, NP

## 2020-07-18 ENCOUNTER — Encounter: Payer: Self-pay | Admitting: Physical Medicine and Rehabilitation

## 2020-07-24 ENCOUNTER — Ambulatory Visit: Payer: MEDICAID | Admitting: Physical Medicine and Rehabilitation

## 2020-07-24 ENCOUNTER — Ambulatory Visit: Payer: Medicaid Other | Admitting: Physical Medicine and Rehabilitation

## 2020-07-26 IMAGING — CR DX Chest X-Ray Portable
1 series · 1 of 1 positions shown · non-contrast
Comparison: none

Portable chest x-ray
HISTORY: Chest pain.

[ap no grid]
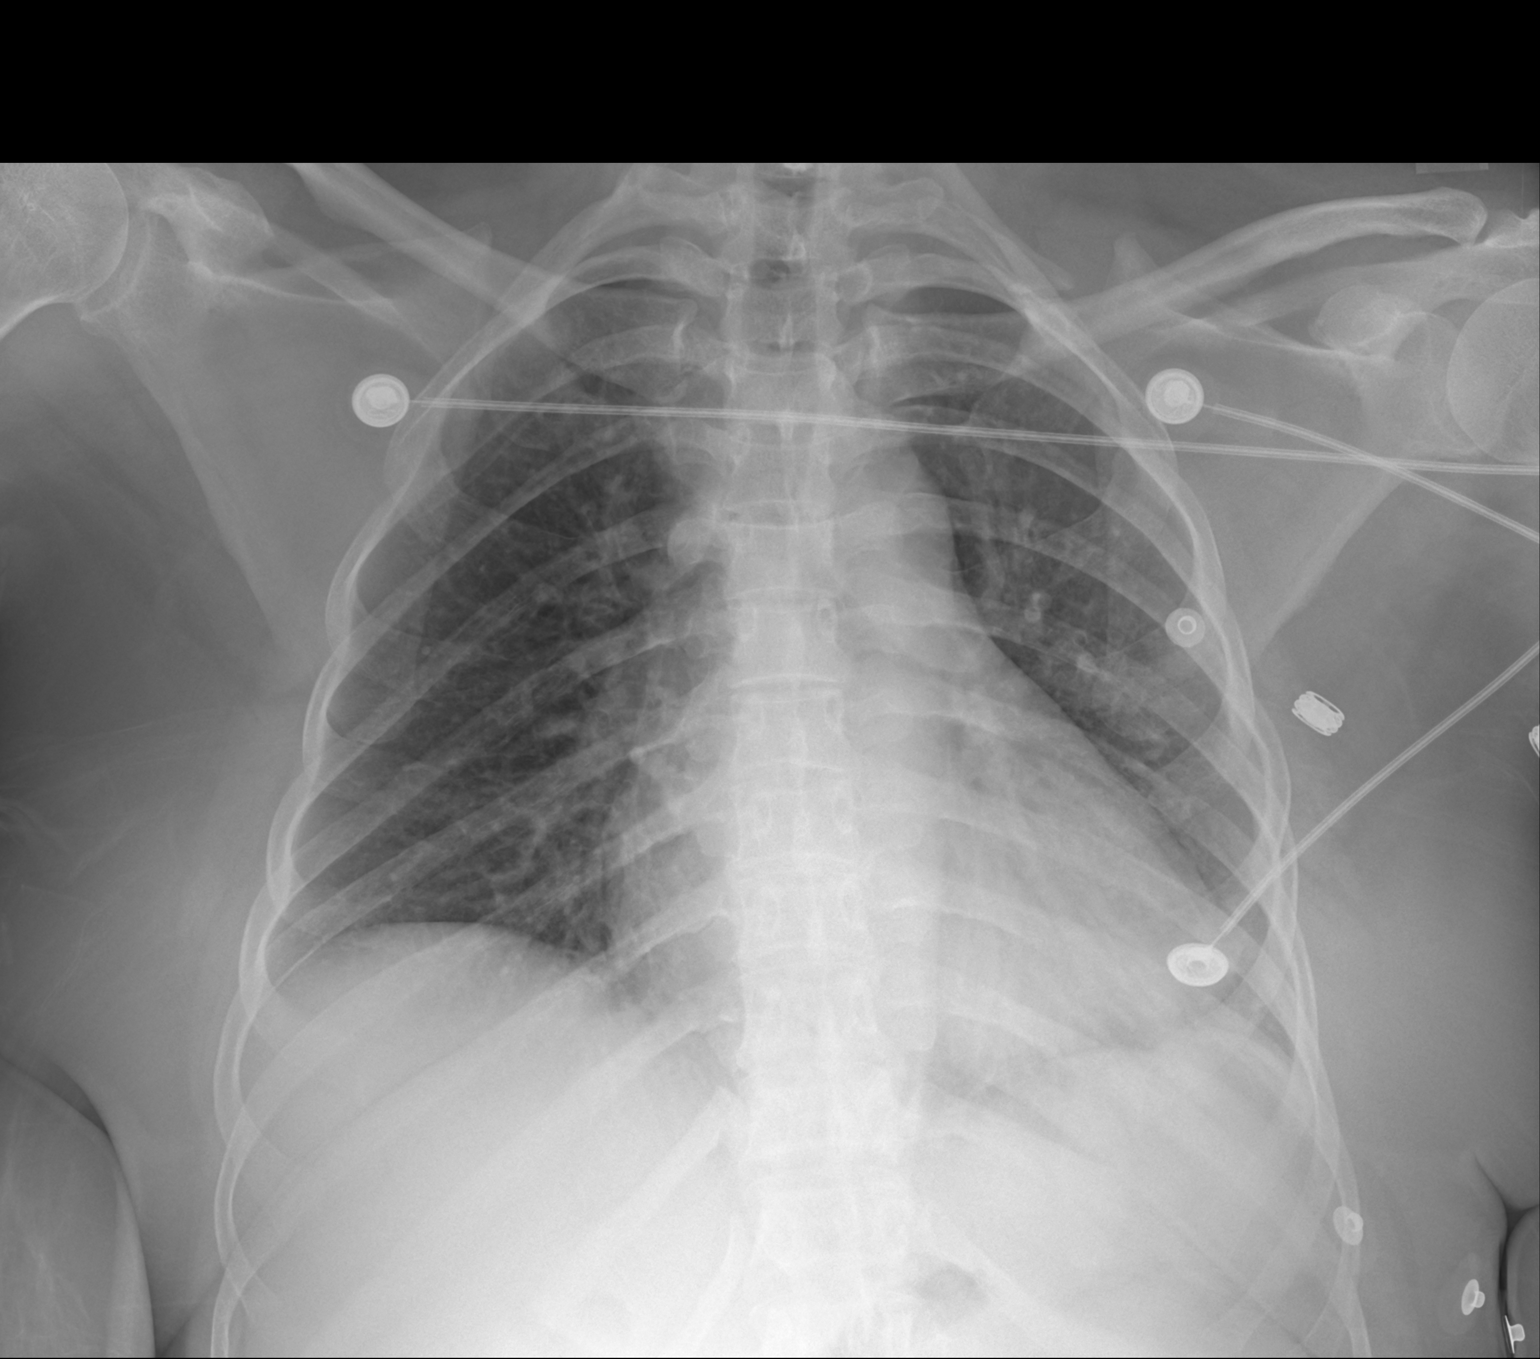

[1 of 1 positions shown; findings below may reference images not displayed]

FINDINGS: Heart size is borderline.                                                                 
 There is no consolidation pleural fluid or pneumothorax. Postoperative changes            
 seen in the lower cervical spine.
IMPRESSION: No acute pulmonary findings.

## 2020-07-29 ENCOUNTER — Encounter: Payer: Self-pay | Admitting: Internal Medicine

## 2020-08-06 ENCOUNTER — Encounter: Payer: Self-pay | Admitting: Physical Medicine and Rehabilitation

## 2020-08-06 ENCOUNTER — Ambulatory Visit
Payer: Medicaid Other | Attending: Physical Medicine and Rehabilitation | Admitting: Physical Medicine and Rehabilitation

## 2020-08-06 ENCOUNTER — Other Ambulatory Visit
Admission: RE | Admit: 2020-08-06 | Discharge: 2020-08-06 | Disposition: A | Discharge status: Home / Self Care | Payer: Medicaid Other | Source: Ambulatory Visit | Attending: Emergency Medicine | Admitting: Emergency Medicine

## 2020-08-06 ENCOUNTER — Other Ambulatory Visit: Payer: Self-pay

## 2020-08-06 VITALS — BP 84/50 | HR 94 | Temp 97.5°F

## 2020-08-06 DIAGNOSIS — G8253 Quadriplegia, C5-C7 complete: Secondary | ICD-10-CM | POA: Insufficient documentation

## 2020-08-06 DIAGNOSIS — L02419 Cutaneous abscess of limb, unspecified: Secondary | ICD-10-CM | POA: Insufficient documentation

## 2020-08-06 LAB — CBC AND DIFFERENTIAL
Baso # K/uL: 0 10*3/uL (ref 0.0–0.1)
Basophil %: 0.2 %
Eos # K/uL: 0 10*3/uL (ref 0.0–0.4)
Eosinophil %: 0.4 %
Hematocrit: 45 % (ref 34–45)
Hemoglobin: 14.2 g/dL (ref 11.2–15.7)
IMM Granulocytes #: 0 10*3/uL (ref 0.0–0.0)
IMM Granulocytes: 0.4 %
Lymph # K/uL: 3.2 10*3/uL (ref 1.2–3.7)
Lymphocyte %: 33.6 %
MCH: 30 pg (ref 26–32)
MCHC: 31 g/dL — ABNORMAL LOW (ref 32–36)
MCV: 96 fL — ABNORMAL HIGH (ref 79–95)
Mono # K/uL: 0.6 10*3/uL (ref 0.2–0.9)
Monocyte %: 6.4 %
Neut # K/uL: 5.6 10*3/uL (ref 1.6–6.1)
Nucl RBC # K/uL: 0 10*3/uL (ref 0.0–0.0)
Nucl RBC %: 0 /100 WBC (ref 0.0–0.2)
Platelets: 129 10*3/uL — ABNORMAL LOW (ref 160–370)
RBC: 4.7 MIL/uL (ref 3.9–5.2)
RDW: 15.2 % — ABNORMAL HIGH (ref 11.7–14.4)
Seg Neut %: 59 %
WBC: 9.5 10*3/uL (ref 4.0–10.0)

## 2020-08-06 LAB — COMPREHENSIVE METABOLIC PANEL
ALT: 18 U/L (ref 0–35)
AST: 16 U/L (ref 0–35)
Albumin: 4.6 g/dL (ref 3.5–5.2)
Alk Phos: 73 U/L (ref 35–105)
Anion Gap: 15 (ref 7–16)
Bilirubin,Total: 0.4 mg/dL (ref 0.0–1.2)
CO2: 22 mmol/L (ref 20–28)
Calcium: 9.9 mg/dL (ref 8.8–10.2)
Chloride: 106 mmol/L (ref 96–108)
Creatinine: 0.55 mg/dL (ref 0.51–0.95)
GFR,Black: 140 *
GFR,Caucasian: 122 *
Glucose: 91 mg/dL (ref 60–99)
Lab: 15 mg/dL (ref 6–20)
Potassium: 4.4 mmol/L (ref 3.3–5.1)
Sodium: 143 mmol/L (ref 133–145)
Total Protein: 8 g/dL — ABNORMAL HIGH (ref 6.3–7.7)

## 2020-08-06 MED ORDER — CEPHALEXIN 500 MG PO CAPS *I*
500.0000 mg | ORAL_CAPSULE | Freq: Four times a day (QID) | ORAL | 0 refills | Status: AC
Start: 2020-08-06 — End: 2020-08-13
  Filled 2020-08-06: qty 28, 7d supply, fill #0

## 2020-08-06 MED ORDER — ONABOTULINUMTOXINA 100 UNIT IJ SOLR *I*
100.0000 [IU] | Freq: Once | INTRAMUSCULAR | Status: AC | PRN
Start: 2020-08-06 — End: 2020-08-06
  Administered 2020-08-06: 100 [IU] via INTRAMUSCULAR

## 2020-08-06 NOTE — Progress Notes (Signed)
[facility] Medicine [facility] Rehabilitation Clinic:    CC: spasticity    HPI:   34 y.o. female with hx of C6 complete tetraplegia.    Patient is here today for a repeat Botox injection of bilateral legs for spasticity.  She was [place] out of state, [place] and went to the emergency due to increased spasticity as well as chest pain.  This was post a long car ride, approximate 11 hours.  Reported she had not been drinking that much water.  She was diagnosed as having urinary tract infection.  She was prescribed Keflex but was unable to get it filled since she was out of state and insurance does not cover.    She reports still having significant spasms in bilateral legs.  She was due for Botox injection in July.        Physical Exam:  Gen: NAD    Neuro:   Significant spasticity bilateral lower limbs, leg extension, quads    Assessment:  34 y.o. female with incomplete tetraplegia.  Spasticity bilateral legs, involving quadriceps, is responded well to Botox injection in the past.    Urinary tract infection: Increase plasticity recently.  Diagnosed with UTI when out of state, July 2021.    Plan:  -Patient was given a prescription for Keflex 500 mg 4 times daily, to be taken for 7 days.  -advised to stop at lab for UC/CS prior to starting antibiotics  - botox injection today, see separate procedure note    Alena Bills, MD    This note was typed/dictated at point of care.  A reasonable amount of effort and time was spent, however errors may occur.

## 2020-08-06 NOTE — Procedures (Signed)
PMR Botox Injections  Performed by: Alena Bills, MD  Authorized by: Alena Bills, MD     Date/time: 08/06/2020  1:30 PM EDT  Injections:  35701 - Chemodenervation 1 ext 1-4 muscles and 64643 - Chemodenervation 1 ext 1-4 muscles, 1st addtl  Medication:  100 units botulinum toxin type A 100 units; 100 units botulinum toxin type A 100 units; 100 units botulinum toxin type A 100 units; 100 units botulinum toxin type A 100 units; 100 units botulinum toxin type A 100 units; 100 units botulinum toxin type A 100 units     PMR Botox Injections  Performed by: Alena Bills, MD  Authorized by: Alena Bills, MD     Date/time: 04/22/2020 11:30 AM EDT  Injections:  77939 - Chemodenervation 1 ext 1-4 muscles and 64643 - Chemodenervation 1 ext 1-4 muscles, 1st addtl  Modifiers:  LT and RT  Medication:  100 units botulinum toxin type A 100 units; 100 units botulinum toxin type A 100 units; 100 units botulinum toxin type A 100 units; 100 units botulinum toxin type A 100 units; 100 units botulinum toxin type A 100 units; 100 units botulinum toxin type A 100 units   Botulinum Toxin Injection Procedure Note     Pre-operative Diagnosis: 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     The area for injection was identified and a time out called to re-identify.      The correct patient was identified with 2 identifiers The correct procedure, location site(s) and laterality were identified with the patient and/or guardian and correspond to the consent form  The appropriate site was marked and verified.  The patient was placed in the correct position.    After prepping the skin with alcohol overlying the following muscles, botlinum toxin was injected intramuscularly as  follows.     Right and left quadriceps (vastus lateralis 75, vastus medialis 75, vastus intermedius 75, rectus femoris 75) 300 + 300 units      Botulinum toxin Lot #: [id number]  Botulinum toxin expiration date: 10/2022  Total botox units injected: 600 units   Total botox units wasted: 0 units     The patient tolerated the procedure without complications.    Patient will follow up in 12 weeks for repeat botox injection       Alena Bills, MD

## 2020-08-07 ENCOUNTER — Other Ambulatory Visit
Admission: RE | Admit: 2020-08-07 | Discharge: 2020-08-07 | Disposition: A | Discharge status: Home / Self Care | Payer: Medicaid Other | Source: Ambulatory Visit | Attending: Physical Medicine and Rehabilitation | Admitting: Physical Medicine and Rehabilitation

## 2020-08-07 DIAGNOSIS — G8253 Quadriplegia, C5-C7 complete: Secondary | ICD-10-CM | POA: Insufficient documentation

## 2020-08-07 LAB — URINALYSIS WITH MICROSCOPIC
Hyaline Casts,UA: NONE SEEN /lpf (ref 0–5)
Squam Epithel,UA: NONE SEEN /lpf (ref 0–?)
WBC,UA: >50 /hpf — AB (ref 0–5)

## 2020-08-08 LAB — AEROBIC CULTURE: Aerobic Culture: 0

## 2020-08-11 ENCOUNTER — Other Ambulatory Visit: Payer: Self-pay

## 2020-08-16 ENCOUNTER — Telehealth: Payer: Self-pay | Admitting: Internal Medicine

## 2020-08-16 NOTE — Telephone Encounter (Signed)
Copied from [place] [phone]. Topic: Access to Care - Speak to Provider/Office Staff  >> Aug 16, 2020 10:12 AM Verda Cumins wrote:  Richardson Landry from [facility] reports they are extending PCA services from 8/31 - 11/29.  Auth # [id number]    Steve's contact number is [phone]

## 2020-08-16 NOTE — Telephone Encounter (Signed)
Message noted.

## 2020-08-19 ENCOUNTER — Encounter: Payer: Self-pay | Admitting: Physical Medicine and Rehabilitation

## 2020-08-19 DIAGNOSIS — G8253 Quadriplegia, C5-C7 complete: Secondary | ICD-10-CM

## 2020-08-22 ENCOUNTER — Encounter: Payer: Self-pay | Admitting: Dermatology

## 2020-08-22 ENCOUNTER — Ambulatory Visit: Payer: Medicaid Other | Admitting: Dermatology

## 2020-08-22 NOTE — Telephone Encounter (Signed)
Please advise 

## 2020-09-05 ENCOUNTER — Telehealth: Payer: Self-pay | Admitting: Student in an Organized Health Care Education/Training Program

## 2020-09-05 ENCOUNTER — Ambulatory Visit: Payer: Medicaid Other | Admitting: Student in an Organized Health Care Education/Training Program

## 2020-09-05 NOTE — Telephone Encounter (Signed)
-----   Message from Thornell Mule, RN sent at 09/05/2020  2:35 PM EDT -----    ----- Message -----  From: Ezequiel Ganser, MD  Sent: 09/05/2020   2:27 PM EDT  To: Sherlon Handing, NP, [facility]    This patient did not come to her visit with me. We would like to see how she is doing and talk about follow up for her prior studies. Could we make another appointment for her to see me next week?    Sheryle Hail

## 2020-09-05 NOTE — Telephone Encounter (Signed)
Call to pt, she is scheduled with Dr Sander Nephew on September 16 at 1:10 pm

## 2020-09-11 ENCOUNTER — Ambulatory Visit: Payer: Medicaid Other | Admitting: Student in an Organized Health Care Education/Training Program

## 2020-09-12 ENCOUNTER — Telehealth: Payer: Self-pay | Admitting: Student in an Organized Health Care Education/Training Program

## 2020-09-12 ENCOUNTER — Encounter: Payer: Self-pay | Admitting: Student in an Organized Health Care Education/Training Program

## 2020-09-12 ENCOUNTER — Ambulatory Visit: Payer: Medicaid Other | Admitting: Student in an Organized Health Care Education/Training Program

## 2020-09-12 NOTE — Telephone Encounter (Signed)
Called patient but it went directly to VM. Told her to please look at [employer].    Ezequiel Ganser, MD  Internal Medicine PGY-3

## 2020-09-13 ENCOUNTER — Telehealth: Payer: Self-pay

## 2020-09-13 NOTE — Telephone Encounter (Signed)
Copied from [place] [id number]. Topic: Appointments - Schedule Appointment  >> Sep 13, 2020  8:12 AM Eddie North wrote:  .Request for a new patient consultation for:  Abnormal CT of the chest    Is this patient aware of referral? YES/NO: yes    Referring provider: Sherlon Handing, NP   Phone and fax:  [phone]   Person you spoke with: Barnett Applebaum    Referral? Internal     If external, were records requested? YES/NO: no   -Imaging? YES/NO: no  -Where was imaging done? [facility]? [facility]? Other Location? [facility]  -Was the imaging completed in the last year? YES/NO: yes    Does the patient have insurance? YES/NO: yes   - If no insurance, was [facility] notified to update insurance ?  YES/NO: no    Date/Time of appointment:  12/03/2020 at 1:30PM

## 2020-09-17 ENCOUNTER — Telehealth: Payer: Self-pay | Admitting: Student in an Organized Health Care Education/Training Program

## 2020-09-17 ENCOUNTER — Inpatient Hospital Stay
Admission: EM | Admit: 2020-09-17 | Discharge: 2020-09-20 | DRG: 138 | Disposition: A | Discharge status: Home Health | Payer: Medicaid Other | Source: Ambulatory Visit | Attending: Internal Medicine | Admitting: Internal Medicine

## 2020-09-17 ENCOUNTER — Emergency Department: Payer: Medicaid Other

## 2020-09-17 ENCOUNTER — Inpatient Hospital Stay: Payer: Medicaid Other

## 2020-09-17 ENCOUNTER — Encounter: Payer: Self-pay | Admitting: Gastroenterology

## 2020-09-17 DIAGNOSIS — R0689 Other abnormalities of breathing: Secondary | ICD-10-CM

## 2020-09-17 DIAGNOSIS — N319 Neuromuscular dysfunction of bladder, unspecified: Secondary | ICD-10-CM | POA: Diagnosis present

## 2020-09-17 DIAGNOSIS — Z20822 Contact with and (suspected) exposure to covid-19: Secondary | ICD-10-CM | POA: Diagnosis present

## 2020-09-17 DIAGNOSIS — R509 Fever, unspecified: Secondary | ICD-10-CM

## 2020-09-17 DIAGNOSIS — K592 Neurogenic bowel, not elsewhere classified: Secondary | ICD-10-CM | POA: Diagnosis present

## 2020-09-17 DIAGNOSIS — G8250 Quadriplegia, unspecified: Secondary | ICD-10-CM | POA: Diagnosis present

## 2020-09-17 DIAGNOSIS — R918 Other nonspecific abnormal finding of lung field: Secondary | ICD-10-CM

## 2020-09-17 DIAGNOSIS — J9811 Atelectasis: Secondary | ICD-10-CM

## 2020-09-17 DIAGNOSIS — J121 Respiratory syncytial virus pneumonia: Principal | ICD-10-CM | POA: Diagnosis present

## 2020-09-17 DIAGNOSIS — G8254 Quadriplegia, C5-C7 incomplete: Secondary | ICD-10-CM

## 2020-09-17 DIAGNOSIS — D696 Thrombocytopenia, unspecified: Secondary | ICD-10-CM

## 2020-09-17 DIAGNOSIS — G901 Familial dysautonomia [Riley-Day]: Secondary | ICD-10-CM | POA: Diagnosis present

## 2020-09-17 DIAGNOSIS — D7582 Heparin induced thrombocytopenia (HIT): Secondary | ICD-10-CM | POA: Diagnosis present

## 2020-09-17 DIAGNOSIS — J9601 Acute respiratory failure with hypoxia: Secondary | ICD-10-CM | POA: Diagnosis present

## 2020-09-17 DIAGNOSIS — R1310 Dysphagia, unspecified: Secondary | ICD-10-CM | POA: Diagnosis present

## 2020-09-17 DIAGNOSIS — R0602 Shortness of breath: Secondary | ICD-10-CM

## 2020-09-17 DIAGNOSIS — R0981 Nasal congestion: Secondary | ICD-10-CM

## 2020-09-17 LAB — COMPREHENSIVE METABOLIC PANEL
ALT: 17 U/L (ref 0–35)
AST: 17 U/L (ref 0–35)
Albumin: 4.5 g/dL (ref 3.5–5.2)
Alk Phos: 71 U/L (ref 35–105)
Anion Gap: 12 (ref 7–16)
Bilirubin,Total: 0.3 mg/dL (ref 0.0–1.2)
CO2: 24 mmol/L (ref 20–28)
Calcium: 9.7 mg/dL (ref 8.8–10.2)
Chloride: 104 mmol/L (ref 96–108)
Creatinine: 0.47 mg/dL — ABNORMAL LOW (ref 0.51–0.95)
GFR,Black: 148 *
GFR,Caucasian: 128 *
Glucose: 115 mg/dL — ABNORMAL HIGH (ref 60–99)
Lab: 9 mg/dL (ref 6–20)
Potassium: 3.8 mmol/L (ref 3.3–5.1)
Sodium: 140 mmol/L (ref 133–145)
Total Protein: 7.7 g/dL (ref 6.3–7.7)

## 2020-09-17 LAB — PROTIME-INR
INR: 1.1 (ref 0.9–1.1)
Protime: 13 s — ABNORMAL HIGH (ref 10.0–12.9)

## 2020-09-17 LAB — CBC AND DIFFERENTIAL
Baso # K/uL: 0 10*3/uL (ref 0.0–0.1)
Basophil %: 0.1 %
Eos # K/uL: 0.1 10*3/uL (ref 0.0–0.4)
Eosinophil %: 0.8 %
Hematocrit: 42 % (ref 34–45)
Hemoglobin: 13.6 g/dL (ref 11.2–15.7)
IMM Granulocytes #: 0 10*3/uL (ref 0.0–0.0)
IMM Granulocytes: 0.3 %
Lymph # K/uL: 1.8 10*3/uL (ref 1.2–3.7)
Lymphocyte %: 23.7 %
MCH: 31 pg (ref 26–32)
MCHC: 32 g/dL (ref 32–36)
MCV: 96 fL — ABNORMAL HIGH (ref 79–95)
Mono # K/uL: 0.4 10*3/uL (ref 0.2–0.9)
Monocyte %: 5.5 %
Neut # K/uL: 5.2 10*3/uL (ref 1.6–6.1)
Nucl RBC # K/uL: 0 10*3/uL (ref 0.0–0.0)
Nucl RBC %: 0 /100 WBC (ref 0.0–0.2)
Platelets: 87 10*3/uL — ABNORMAL LOW (ref 160–370)
RBC: 4.4 MIL/uL (ref 3.9–5.2)
RDW: 15.3 % — ABNORMAL HIGH (ref 11.7–14.4)
Seg Neut %: 69.6 %
WBC: 7.4 10*3/uL (ref 4.0–10.0)

## 2020-09-17 LAB — RSV PCR
RSV PCR: POSITIVE — AB
RSV PCR: POSITIVE — AB

## 2020-09-17 LAB — INFLUENZA A
Influenza A PCR: 0
Influenza A PCR: 0

## 2020-09-17 LAB — URINALYSIS WITH MICROSCOPIC
Glucose,UA: NEGATIVE
Ketones, UA: NEGATIVE
Nitrite,UA: NEGATIVE
Specific Gravity,UA: 1.039 — ABNORMAL HIGH (ref 1.002–1.030)
pH,UA: 7.5 (ref 5.0–8.0)

## 2020-09-17 LAB — FERRITIN: Ferritin: 89 ng/mL (ref 10–120)

## 2020-09-17 LAB — TRIGLYCERIDES: Triglycerides: 46 mg/dL

## 2020-09-17 LAB — CRP: CRP: 21 mg/L — ABNORMAL HIGH (ref 0–8)

## 2020-09-17 LAB — INFLUENZA B PCR
Influenza B PCR: 0
Influenza B PCR: 0

## 2020-09-17 LAB — PERFORMING LAB

## 2020-09-17 LAB — TYPE AND SCREEN
ABO RH Blood Type: O POS
Antibody Screen: NEGATIVE

## 2020-09-17 LAB — REVIEWED BY:

## 2020-09-17 LAB — CK: CK: 136 U/L (ref 26–192)

## 2020-09-17 LAB — TROPONIN T 0 HR HIGH SENSITIVITY (IP/ED ONLY): TROP T 0 HR High Sensitivity: <6 ng/L (ref 0–13)

## 2020-09-17 LAB — COVID-19 NAAT (PCR)
COVID-19 NAAT (PCR): NEGATIVE
COVID-19 NAAT (PCR): NEGATIVE

## 2020-09-17 LAB — COVID-19 PCR

## 2020-09-17 LAB — TROPONIN T 3 HR W/ DELTA HIGH SENSITIVITY (IP/ED ONLY): TROP T 3 HR High Sensitivity: <6 ng/L (ref 0–13)

## 2020-09-17 LAB — LACTATE DEHYDROGENASE: LD: 198 U/L (ref 118–225)

## 2020-09-17 LAB — INTERPRETATION,SPEC COAG

## 2020-09-17 LAB — D-DIMER, QUANTITATIVE: D-Dimer: 1.23 ug/mL FEU — ABNORMAL HIGH (ref 0.00–0.50)

## 2020-09-17 LAB — SPEC COAG REVIEW

## 2020-09-17 LAB — APTT: aPTT: 23 s — ABNORMAL LOW (ref 25.8–37.9)

## 2020-09-17 LAB — LACTATE, PLASMA: Lactate: 1.2 mmol/L (ref 0.5–2.2)

## 2020-09-17 MED ORDER — ACETAMINOPHEN 325 MG PO TABS *I*
650.0000 mg | ORAL_TABLET | ORAL | Status: DC | PRN
Start: 2020-09-17 — End: 2020-09-17

## 2020-09-17 MED ORDER — ACETAMINOPHEN 500 MG PO TABS *I*
1000.0000 mg | ORAL_TABLET | Freq: Three times a day (TID) | ORAL | Status: DC
Start: 2020-09-17 — End: 2020-09-20
  Administered 2020-09-17 – 2020-09-20 (×8): 1000 mg via ORAL
  Filled 2020-09-17 (×9): qty 2

## 2020-09-17 MED ORDER — DOXYCYCLINE HYCLATE 100 MG PO TABS *I*
100.0000 mg | ORAL_TABLET | Freq: Two times a day (BID) | ORAL | Status: DC
Start: 2020-09-17 — End: 2020-09-20
  Administered 2020-09-17 – 2020-09-20 (×5): 100 mg via ORAL
  Filled 2020-09-17 (×7): qty 1

## 2020-09-17 MED ORDER — SODIUM CHLORIDE 0.9 % FLUSH FOR PUMPS *I*
0.0000 mL/h | INTRAVENOUS | Status: DC | PRN
Start: 2020-09-17 — End: 2020-09-20

## 2020-09-17 MED ORDER — LACTATED RINGERS IV BOLUS *I*
1000.0000 mL | Freq: Once | INTRAVENOUS | Status: AC
Start: 2020-09-17 — End: 2020-09-17
  Administered 2020-09-17: 1000 mL via INTRAVENOUS

## 2020-09-17 MED ORDER — DEXTROSE 5 % FLUSH FOR PUMPS *I*
0.0000 mL/h | INTRAVENOUS | Status: DC | PRN
Start: 2020-09-17 — End: 2020-09-20

## 2020-09-17 MED ORDER — LACTATED RINGERS IV BOLUS *I*
500.0000 mL | Freq: Once | INTRAVENOUS | Status: AC
Start: 2020-09-17 — End: 2020-09-17
  Administered 2020-09-17: 500 mL via INTRAVENOUS

## 2020-09-17 MED ORDER — FONDAPARINUX SODIUM 2.5 MG/0.5ML SC SOLN *I*
2.5000 mg | SUBCUTANEOUS | Status: DC
Start: 2020-09-17 — End: 2020-09-20
  Administered 2020-09-17 – 2020-09-19 (×3): 2.5 mg via SUBCUTANEOUS
  Filled 2020-09-17 (×6): qty 0.5

## 2020-09-17 MED ORDER — GUAIFENESIN 600 MG PO TB12 *I*
600.0000 mg | ORAL_TABLET | Freq: Two times a day (BID) | ORAL | Status: DC
Start: 2020-09-17 — End: 2020-09-20
  Administered 2020-09-17 – 2020-09-20 (×6): 600 mg via ORAL
  Filled 2020-09-17 (×7): qty 1

## 2020-09-17 MED ORDER — CEFTRIAXONE SODIUM 1 G IN STERILE WATER 10ML SYRINGE *I*
1000.0000 mg | INTRAVENOUS | Status: DC
Start: 2020-09-17 — End: 2020-09-20
  Administered 2020-09-17 – 2020-09-19 (×3): 1000 mg via INTRAVENOUS
  Filled 2020-09-17: qty 10
  Filled 2020-09-17: qty 1000
  Filled 2020-09-17 (×2): qty 10

## 2020-09-17 MED ORDER — GUAIFENESIN 600 MG PO TB12 *I*
600.0000 mg | ORAL_TABLET | Freq: Once | ORAL | Status: AC
Start: 2020-09-17 — End: 2020-09-17
  Administered 2020-09-17: 600 mg via ORAL
  Filled 2020-09-17: qty 1

## 2020-09-17 MED ORDER — IOHEXOL 350 MG/ML (OMNIPAQUE) IV SOLN 500ML BOTTLE *I*
1.0000 mL | Freq: Once | INTRAVENOUS | Status: AC
Start: 2020-09-17 — End: 2020-09-17
  Administered 2020-09-17: 70 mL via INTRAVENOUS

## 2020-09-17 MED ORDER — DEXAMETHASONE 4 MG PO TABS *I*
6.0000 mg | ORAL_TABLET | Freq: Every day | ORAL | Status: DC
Start: 2020-09-17 — End: 2020-09-18
  Administered 2020-09-17: 6 mg via ORAL
  Filled 2020-09-17: qty 2

## 2020-09-17 NOTE — Plan of Care (Signed)
Repeat COVID negative.  Will get blood cultures, lactate and start ctx/doxy for CAP.   Will give 1L bolus (last EF 65% in 2019) given ongoing tachycardia (likely worsened by fever).   Amado Nash, MD 09/17/2020 7:45 PM  Hospital Medicine Attending  [facility] [address]  Please page with questions or concerns  Some portions of this encounter were dictated using Dragon transcription software. Errors will undoubtedly be present, despite attempts to avoid them.

## 2020-09-17 NOTE — ED Notes (Signed)
Report Given To  Hilliard Clark, RN      Descriptive Sentence / Reason for Admission   Pt is a quadriplegic, coming to the ED after exposure to her son who is COVID +. Pt is COVID negative upon this admission. Pt reports fevers at home and SOB. Pt febrile to 103, since resolved, septic workup in progress.       Active Issues / Relevant Events   Full code   A&Ox4  Bed bound at baseline  2nd COVID test negative   CT shows complete atelectasis of L lower lobe   Increased secretions   Febrile 103 --> 101.7 --> 99.1         To Do List  VS Q4   Assess Q4   Meds per [facility]         Anticipatory Guidance / Discharge Planning  Admit for respiratory insufficiency

## 2020-09-17 NOTE — ED Notes (Signed)
Pt turned and repositoned in bed. Urostomy emptied. RSV/Flu swab sent. Writer addressed patient's needs and concerns. Provider team at bedside

## 2020-09-17 NOTE — ED Notes (Signed)
Report Given To  Rachael RN      Descriptive Sentence / Reason for Admission   Pt is a quad, coming to the ED after exposure to her son who is COVID +.  Pt reports fevers at home and SOB.       Active Issues / Relevant Events   A&Ox4  Bed bound at baseline  2nd COVID test pending  CT shows complete atelectasis of L lower lobe      To Do List  Medication  V/A      Anticipatory Guidance / Discharge Planning  Admit for respiratory insufficiency

## 2020-09-17 NOTE — ED Notes (Signed)
Patient arrives to the ED following shortness of breathe, chest pain. States her son is COVID positive and resides with her. Increased mucus production out of previous trach site. Reports chills last night.

## 2020-09-17 NOTE — ED Notes (Addendum)
Writer entered room to fin pt had pulled off oxygen, telemetry, and pulse ox. Writer placed telemetry and pulse ox back on pt, pt found to be 80% on RA, pt placed back on 5L NC, tachycardic to 130s, oral temperature obtained of 103, provider made aware, will continue to closely monitor pt.

## 2020-09-17 NOTE — ED Notes (Signed)
09/17/20 0659   Provider Notification   Reason for Communication Other (Comment)  (PT IS RSV POSITIVE)   Provider Name [name]   Provider Role Resident   Response In department   Provider Notification Time [phone]

## 2020-09-17 NOTE — Bed Hold Note (Signed)
Bed: AC-10L  Expected date:   Expected time:   Means of arrival:   Comments:  ACHA

## 2020-09-17 NOTE — ED Notes (Signed)
Writer was contacted by bedside nurse Roselyn Reef due to pt refusing to be treated by RN. Writer entered the room and asked pt what her concern was with the nurse. Pt responded and said "he's gross". Writer asked for clarification if something happened. Pt states she wants another nurse. Writer explained that Roselyn Reef was the nurse assigned to her at this time. Pt refusing to speak with writer and plugging her ears at this time saying "get out of my room".
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
## 2020-09-17 NOTE — ED Triage Notes (Signed)
Pt is a quad, coming to the ED after exposure to her son who is COVID +.  Pt reports fevers at home and SOB.        Triage Note   Cindy Koller, RN
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
## 2020-09-17 NOTE — ED Notes (Signed)
Pt requesting to leave AMA, provider made aware.
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
## 2020-09-17 NOTE — Telephone Encounter (Signed)
Phone Call: [facility] After Hours Triage     Date of call: 09/17/2020  Time of call: 4:08 AM  Phone number: [phone]    Amar Keenum 12-Jan-1986 called reporting:   Patient states her son had COVID recentky. She states she is having sharp chest pain. Her lungs are filled with mucus. She states she is calling because she is thinking about going to [facility] for evaluation.    Patient became difficult to hear, asked to repeat what she was saying. Patient either hung up the phone or was disconnected due to poor connection.     Called three times, no answer, straight to voicemail.    Unable to provide recommendations or clarify history.      Author:  Dennison Mascot, M.D.  Internal Medicine PGY-2  09/17/20 at 4:08 AM

## 2020-09-18 ENCOUNTER — Encounter: Payer: Self-pay | Admitting: Internal Medicine

## 2020-09-18 LAB — CBC AND DIFFERENTIAL
Baso # K/uL: 0 10*3/uL (ref 0.0–0.1)
Basophil %: 0 %
Eos # K/uL: 0 10*3/uL (ref 0.0–0.4)
Eosinophil %: 0 %
Hematocrit: 39 % (ref 34–45)
Hemoglobin: 12.3 g/dL (ref 11.2–15.7)
IMM Granulocytes #: 0 10*3/uL (ref 0.0–0.0)
IMM Granulocytes: 0.3 %
Lymph # K/uL: 0.8 10*3/uL — ABNORMAL LOW (ref 1.2–3.7)
Lymphocyte %: 9.4 %
MCH: 30 pg (ref 26–32)
MCHC: 32 g/dL (ref 32–36)
MCV: 97 fL — ABNORMAL HIGH (ref 79–95)
Mono # K/uL: 0.4 10*3/uL (ref 0.2–0.9)
Monocyte %: 4.4 %
Neut # K/uL: 7.6 10*3/uL — ABNORMAL HIGH (ref 1.6–6.1)
Nucl RBC # K/uL: 0 10*3/uL (ref 0.0–0.0)
Nucl RBC %: 0 /100 WBC (ref 0.0–0.2)
Platelets: 68 10*3/uL — ABNORMAL LOW (ref 160–370)
RBC: 4 MIL/uL (ref 3.9–5.2)
RDW: 15.2 % — ABNORMAL HIGH (ref 11.7–14.4)
Seg Neut %: 85.9 %
WBC: 8.8 10*3/uL (ref 4.0–10.0)

## 2020-09-18 LAB — BASIC METABOLIC PANEL
Anion Gap: 14 (ref 7–16)
CO2: 21 mmol/L (ref 20–28)
Calcium: 9.2 mg/dL (ref 8.8–10.2)
Chloride: 106 mmol/L (ref 96–108)
Creatinine: 0.41 mg/dL — ABNORMAL LOW (ref 0.51–0.95)
GFR,Black: 155 *
GFR,Caucasian: 134 *
Glucose: 130 mg/dL — ABNORMAL HIGH (ref 60–99)
Lab: 10 mg/dL (ref 6–20)
Potassium: 3.9 mmol/L (ref 3.3–5.1)
Sodium: 141 mmol/L (ref 133–145)

## 2020-09-18 LAB — PROCALCITONIN: Procalcitonin: 0.06 ng/mL (ref 0.00–0.08)

## 2020-09-18 MED ORDER — BISACODYL 10 MG RE SUPP *I*
10.0000 mg | Freq: Every day | RECTAL | Status: DC | PRN
Start: 2020-09-18 — End: 2020-09-20
  Administered 2020-09-19: 10 mg via RECTAL

## 2020-09-18 MED ORDER — SODIUM CHLORIDE 3 % IN NEBU *I*
4.0000 mL | INHALATION_SOLUTION | Freq: Four times a day (QID) | RESPIRATORY_TRACT | Status: DC
Start: 2020-09-18 — End: 2020-09-20
  Administered 2020-09-18 (×2): 4 mL via RESPIRATORY_TRACT
  Filled 2020-09-18 (×16): qty 4

## 2020-09-18 MED ORDER — POLYETHYLENE GLYCOL 3350 PO PACK 17 GM *I*
17.0000 g | PACK | Freq: Every day | ORAL | Status: DC
Start: 2020-09-18 — End: 2020-09-20
  Administered 2020-09-18 – 2020-09-19 (×2): 17 g via ORAL
  Filled 2020-09-18 (×3): qty 17
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
## 2020-09-18 NOTE — ED Notes (Signed)
Report Given To  Lanelle Bal ,RN      Descriptive Sentence / Reason for Admission   Pt is a quadriplegic, coming to the ED after exposure to her son who is COVID +. Pt is COVID negative upon this admission. Pt reports fevers at home and SOB. Pt febrile to 103, since resolved, septic workup in progress.       Active Issues / Relevant Events   Full code   A&Ox4, Bed bound at baseline  2nd COVID test negative   CT shows complete atelectasis of L lower lobe   Increased secretions   Febrile 103 --> 100.4  Urostomy: foul smelling urine, supplies at bedside        To Do List  VS Q4   Assess Q4   Meds per [facility]   Chest physiotherapy Q4, respiratory aware (not at bedside yet)      Anticipatory Guidance / Discharge Planning  Admit for respiratory insufficiency

## 2020-09-18 NOTE — ED Notes (Signed)
Report Given To  [facility]      Descriptive Sentence / Reason for Admission   Pt is a quadriplegic, coming to the ED after exposure to her son who is COVID +. Pt is COVID negative upon this admission. Pt reports fevers at home and SOB. Pt febrile to 103, since resolved, septic workup in progress.       Active Issues / Relevant Events   Full code   A&Ox4  Bed bound at baseline  2nd COVID test negative   CT shows complete atelectasis of L lower lobe   Increased secretions   Febrile 103 --> 101.7 --> 99.1 --> 99.6  Urostomy: foul smelling urine        To Do List  VS Q4   Assess Q4   Meds per [facility]         Anticipatory Guidance / Discharge Planning  Admit for respiratory insufficiency

## 2020-09-18 NOTE — Progress Notes (Signed)
09/18/20 0900   [facility] Patient Class Review   Patient Class Review Inpatient   Patient Class Effective 09/17/20  Earnest Conroy   Utilization Management    Pager [phone]

## 2020-09-18 NOTE — ED Notes (Signed)
Report Given To  Kayla, RN      Descriptive Sentence / Reason for Admission   Pt is a quadriplegic, coming to the ED after exposure to her son who is COVID +. Pt is COVID negative upon this admission. Pt reports fevers at home and SOB. Pt febrile to 103, since resolved, septic workup in progress.       Active Issues / Relevant Events   Full code   A&Ox4  Bed bound at baseline  2nd COVID test negative   CT shows complete atelectasis of L lower lobe   Increased secretions   Febrile 103 --> 101.7 --> 99.1 --> 100  Urostomy: foul smelling urine        To Do List  VS Q4   Assess Q4   Meds per [facility]         Anticipatory Guidance / Discharge Planning  Admit for respiratory insufficiency

## 2020-09-18 NOTE — ED Notes (Signed)
Reviewed education on importance of deep breathing and coughing during hospital admission. Patient was able to verbalize understanding. States she does not want to wear oxygen.

## 2020-09-18 NOTE — ED Notes (Signed)
Writer called facilities at [facility] for patient to receive touch pad call light.

## 2020-09-18 NOTE — Progress Notes (Signed)
SPEECH LANGUAGE PATHOLOGY  CLINICAL SWALLOW EVALUATION    Recommendations:  1. Regular Solids/Thin Liquids  2. Meds whole in puree or 1 at a time- per patient preference  3. Upright 90 degrees, Small bites/sips, Slow rate  4. Oral care 2-3x/day  5. Discussed with patient if desired, could completed outpatient MBSS for updated swallow mechanism and risk of aspiration due to persistent stoma.       Education provided, all questions answered within SLP scope of practice.     09/18/20 1208   Reason for referral   Referral Reason Assess safety for P.O. diet; Assess for diet upgrade   BEDSIDE SWALLOW COMMENTS   History of Dysphagia Comments Cindy Ochoa is a 34 y.o. 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 Chart reviewed hx of multiple PNA's in 2006. None since. FEEs completed in 2011. No aspiration or penetration noted. Lurline Idol 'removed' in 2011. Stoma remains-per patient never closed due to smoking. Denies difficulty swallow prior to event with [employer] yesterday. No difficuly since. Direct reads regular/thin liquids. On RA.   ASSESSMENT   Cognition No deficits identified   Alertness level Fully alert   Ability to follow directions Simple; Complex   Head and neck control Independent   Secretion Management Pink, moist   Reflexive cough Not elicited   Voluntary Cough Adequate   Reflexive swallow Present   Voluntary swallow Present   Respiratory Status RA   Communication Status No deficits identified   Oral Mechanism   Oral Mechanism Tested Not impaired   PO TRIAL CONSISTENCIES   P.O. Trials Tested Adequate for all consistencies tested   Aspiration Precautions/Feeding Guidelines   Risk for Aspiration Low  (Low-Mild)   Feeding Guidelines Upright 90 degrees; Single sips; Small bites and sips; Slow rate   Reflux precautions to include Upright 90 degrees for all meals   Medication instructions Whole/one at a  time   Impressions   Severity Not applicable   Summary Cindy Ochoa presents with an age-appropriate and functional oropharyngeal swallow mechanism without overt s/s of aspiration. Recommend continuation of a regular solids/thin liquid diet. Discussed with Cindy Ochoa the low-mild risk of aspiration and questionable silent aspiration due to persistent unclose stoma from previous trach (last in 2011). Educated for outpatient modified barium swallow study as desired.      Diet Recommendations   Diet Consistency Recommendation Regular Diet (Level 7)   Liquids Recommendations Thin liquids    Studies/Therapy Recommendations   Therapy Recommendations F/U not indicated   Frequency of Services Follow up not indicated   Objective Studies/ Recommendations  Pharyngogram/Modified Barium Swallow (time comment)  (Outpatient, if/as desired by patient)   Discharge Recommendations   Patient Will Benefit From TBD   Time Calculation   Start Time 1155   Stop Time 1207   Time Calculation 12       Unit: [facility] ED     Cindy Ochoa, M.S., CCC-SLP  Please contact with any questions. Pager [phone]  This visit was completed during the covid-19 pandemic. PPE Worn for precautions: Gloves, Surgical Mask, Eyewear

## 2020-09-18 NOTE — ED Notes (Signed)
Respiratory contacted to set up chest physio therapy

## 2020-09-18 NOTE — ED Notes (Addendum)
Patient changed for incontinence with the assistance of mother at the bedside. Medplex added to wound on left buttock. Repositioned for comfort and skin integrity.

## 2020-09-18 NOTE — Consults (Signed)
Ostomy Consult Note      Arma Reining is a 34 y.o. year old female referred by team for consultation by Ostomy Nurse re new stoma, and education regarding stoma care, and appliance recommendations.    H&P reviewed.      Pressure Injury  Right ischial - Pressure stage 3 base is pink with maceration surrounding wound. Yellow drainge on old dressing that appears to have been on for several days. Measurements:~1x0.4x0.8cm deep      Stoma  Type: Urostomy    Location: RMQ   Color: red,wet,vialbe   Protrusion: flush to slightly budded  Measures: did not remove wafer and pouch.    Ostomy output: amber output with mucus    Ostomy Supplies being used  1 3/4" convex wafer and pouch, 1 3/4" urostomy pouch, 2" adapt barrier rings.    Wound:  Right ischium: Cleanse with Phase 1, spray on wound let set 1 full minute then pat off excess, apply Triad hydrophillic dressing to wound can cover with Mepilex border dressing.    Education Provided today  Reviewed findings with pt      Plan for next Ostomy Nurse session  PRN    Gonzella Lex MS RN [facility]  Pager [phone]

## 2020-09-18 NOTE — ED Notes (Signed)
Assumed care of patient. Patient appears to be in non-acute distress, resting comfortably in bed. Bed locked and in lowest position. Call bell in reach. Will continue to monitor and treat per provider's orders.     Plan of Care:  VS per order  Meds per MAR

## 2020-09-18 NOTE — ED Notes (Signed)
Assumed care of pt. Pt resting comfortably in bed, appears in NAD. Call light within reach, will continue to monitor.

## 2020-09-18 NOTE — ED Notes (Signed)
Patient mother at the bedside assisting with turning, repositioning, and ADLs. Wants things to be not be left in daughters bed. Writer unable to find any items that were left by staff in bed.
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
## 2020-09-19 LAB — CBC AND DIFFERENTIAL
Baso # K/uL: 0 10*3/uL (ref 0.0–0.1)
Basophil %: 0.1 %
Eos # K/uL: 0 10*3/uL (ref 0.0–0.4)
Eosinophil %: 0.1 %
Hematocrit: 37 % (ref 34–45)
Hemoglobin: 11.7 g/dL (ref 11.2–15.7)
IMM Granulocytes #: 0 10*3/uL (ref 0.0–0.0)
IMM Granulocytes: 0.3 %
Lymph # K/uL: 2.1 10*3/uL (ref 1.2–3.7)
Lymphocyte %: 30.5 %
MCH: 31 pg (ref 26–32)
MCHC: 32 g/dL (ref 32–36)
MCV: 96 fL — ABNORMAL HIGH (ref 79–95)
Mono # K/uL: 0.6 10*3/uL (ref 0.2–0.9)
Monocyte %: 8 %
Neut # K/uL: 4.3 10*3/uL (ref 1.6–6.1)
Nucl RBC # K/uL: 0 10*3/uL (ref 0.0–0.0)
Nucl RBC %: 0 /100 WBC (ref 0.0–0.2)
Platelets: 59 10*3/uL — ABNORMAL LOW (ref 160–370)
RBC: 3.8 MIL/uL — ABNORMAL LOW (ref 3.9–5.2)
RDW: 15 % — ABNORMAL HIGH (ref 11.7–14.4)
Seg Neut %: 61 %
WBC: 7 10*3/uL (ref 4.0–10.0)

## 2020-09-19 LAB — BASIC METABOLIC PANEL
Anion Gap: 12 (ref 7–16)
CO2: 22 mmol/L (ref 20–28)
Calcium: 8.7 mg/dL — ABNORMAL LOW (ref 8.8–10.2)
Chloride: 104 mmol/L (ref 96–108)
Creatinine: 0.44 mg/dL — ABNORMAL LOW (ref 0.51–0.95)
GFR,Black: 151 *
GFR,Caucasian: 131 *
Glucose: 96 mg/dL (ref 60–99)
Lab: 11 mg/dL (ref 6–20)
Potassium: 3.4 mmol/L (ref 3.3–5.1)
Sodium: 138 mmol/L (ref 133–145)

## 2020-09-19 LAB — INFLUENZA A: Influenza A PCR: 0

## 2020-09-19 LAB — PERFORMING LAB

## 2020-09-19 LAB — RSV PCR: RSV PCR: POSITIVE — AB

## 2020-09-19 LAB — INFLUENZA B PCR: Influenza B PCR: 0

## 2020-09-19 LAB — COVID-19 NAAT (PCR): COVID-19 NAAT (PCR): NEGATIVE

## 2020-09-19 LAB — COVID-19 PCR

## 2020-09-19 MED ORDER — VANCOMYCIN HCL IN NACL 1250 MG/275 ML IV SOLN *I*
1250.0000 mg | Freq: Three times a day (TID) | INTRAVENOUS | Status: DC
Start: 2020-09-19 — End: 2020-09-19
  Filled 2020-09-19 (×3): qty 275

## 2020-09-19 MED ORDER — VANCOMYCIN IV - PHARMACIST TO DOSE PLACEHOLDER *I*
Status: DC
Start: 2020-09-19 — End: 2020-09-19

## 2020-09-19 MED ORDER — VANCOMYCIN HCL IN NACL 1500 MG/275 ML IV SOLN *I*
20.0000 mg/kg | Freq: Once | INTRAVENOUS | Status: AC
Start: 2020-09-19 — End: 2020-09-19
  Administered 2020-09-19: 1500 mg via INTRAVENOUS
  Filled 2020-09-19: qty 275

## 2020-09-19 NOTE — Progress Notes (Signed)
Vancomycin (Initial Dosing) - Pharmacist to Dose    Cindy Ochoa is a 34 y.o. female who has been consulted for vancomycin dosing and is being treated for Empiric Therapy; Pneumonia; Bacteremia    Goal trough concentration is 15-20 mcg/mL.    SCr:     Lab results: 09/19/20  0051   Creatinine 0.44*     BUN:       Lab results: 09/19/20  0051   UN 11       Estimated CrCl: 120 ml/min    Loading dose: 1500 mg    Assessment and Plan   Considering patient's renal function, volume status, clinical status, and reported concentrations, 1250 mg Q8H will obtain goal vancomycin concentration unless renal function or clinical status changes.    Next vancomycin concentration before the 4th or 5th dose, order placed to be drawn on 09/20/20 at 0230.    Pharmacist will follow for changes in renal function, toxicity, and efficacy and order serum concentrations and creatinine as needed.        For questions contact pharmacy at [place], PharmD

## 2020-09-19 NOTE — ED Notes (Signed)
On: 09/18/20 At: 2133.   Positive blood culture reported to: Dr. Windle Guard.  Providers service: Hospital Medicine Service.  Date of collection: 09/17/20 Time: 2023.   Source: Left AC.    Bottle: Aerobic bottle.  Result: Staph epi, methicillin resistance.   Called by: Catalina Antigua. On: 09/18/20 At: 2131.
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
## 2020-09-20 ENCOUNTER — Other Ambulatory Visit: Payer: Self-pay

## 2020-09-20 MED ORDER — CEFPODOXIME PROXETIL 200 MG PO TABS *I*
200.0000 mg | ORAL_TABLET | Freq: Two times a day (BID) | ORAL | Status: DC
Start: 2020-09-20 — End: 2020-09-20

## 2020-09-20 MED ORDER — MELATONIN 3 MG PO TABS *I*
3.0000 mg | ORAL_TABLET | Freq: Every evening | ORAL | Status: DC | PRN
Start: 2020-09-20 — End: 2020-09-20

## 2020-09-20 MED ORDER — CEFPODOXIME PROXETIL 200 MG PO TABS *I*
200.0000 mg | ORAL_TABLET | Freq: Two times a day (BID) | ORAL | Status: DC
Start: 2020-09-20 — End: 2020-09-20
  Filled 2020-09-20: qty 1

## 2020-09-20 MED ORDER — CEFPODOXIME PROXETIL 200 MG PO TABS *I*
200.0000 mg | ORAL_TABLET | Freq: Two times a day (BID) | ORAL | 0 refills | Status: DC
Start: 2020-09-20 — End: 2020-09-26
  Filled 2020-09-20: qty 4, 2d supply, fill #0

## 2020-09-20 MED ORDER — GUAIFENESIN 600 MG PO TB12 *I*
600.0000 mg | ORAL_TABLET | Freq: Two times a day (BID) | ORAL | 0 refills | Status: DC
Start: 2020-09-20 — End: 2020-09-26
  Filled 2020-09-20: qty 60, 30d supply, fill #0

## 2020-09-20 NOTE — Progress Notes (Signed)
In basket message sent to [facility] scheduler for follow up appointment.  Scheduler to call pt with date/time of appointment once scheduled.   Nena Polio, RN  Acute Care Coordinator  [phone]    [facility] Coordinator pager  [phone]

## 2020-09-20 NOTE — Interdisciplinary Rounds (Signed)
Interdisciplinary Discharge Communication Note    Medically Ready for Discharge Date: 09/21/20   Targeted Discharge Disposition : Home    Rounding was performed on: Date: 09/20/20 at: Time: 1552    Interdisciplinary Rounding Participants: Care Coordinator, Charge RN, Staff RN, Resident, Pharmacy    Admit Date/Time:  09/17/2020  5:37 AM     Principal Problem:  <principal problem not specified>  The patient's problem list and interdisciplinary care plan was reviewed.    Expected Date for Discharge: 09/20/2020        Discharge Planning    Targeted Disposition   Targeted Discharge Disposition : [facility], Choiced?: No      Medical Equipment Needs                       Communication                Current PCP:  Thornell Sartorius, MD    Scheduled Future Appointments:  Follow-up Appointments: Follow-up appointment with PCP made   Future Appointments    Wednesday October 16, 2020 10:00 AM  FOLLOW UP VISIT with Ezequiel Ganser, MD  [facility] Internal Medicine (--) [phone]            Transportation  Family to transport               Prescriptions             Nena Polio, RN  3:53 PM
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
## 2020-09-20 NOTE — Progress Notes (Signed)
09/20/20 5331   Evaluation Attempted   Evaluation Attempted Evaluation Attempted   Evaluation Attempt Comment PT order placed, chart review performed.  Patient does not wish to participate in skilled therapy ever.  Based on proir notes, she transfers using hoyer lift and has a power wheelchair.  PT will discontinue order at this time.      Starr Sinclair PT, DPT   Pager#: [phone]

## 2020-09-20 NOTE — Discharge Instructions (Signed)
Brief Summary of Your Hospital Course (including key procedures and diagnostic test results):  You presented to the hospital with shortness of breath and fevers. We got an image of your chest called a CT, which showed hazy areas concerning for a virus. You tested positive for respiratory syntitial virus (RSV) and negative for the flu and Covid. You have been given antibiotics for your RSV, as well as Mucinex and saline nebulizer treatments.    Your instructions:  - take the medications listed below  - go to the doctor appointments listed below  - you will receive a call with the date and time of another doctor's appointment next week to see how you are doing once you leave the hospital    Medications:  1. Cefpodoxime (vantin)  - Take 1 tablet of cefpodoxime (vantin) tonight.  - Take 1 tablet of cefpodoxime (vantin) tomorrow (Saturday) morning and 1 tablet tomorrow (Saturday) evening.  - Take 1 tablet of cefpodoxime (vantin) Sunday morning.    2. Mucinex  - Take 1 tablet of Mucinex in the morning and 1 tablet of Mucinex in the evening as needed.    Recommended diet: Regular - No restrictions    Recommended activity: activity as tolerated    Wound Care: keep wound clean and dry    If you experience any of these symptoms within the first 24 hours after discharge:Uncontrolled pain, Chest pain, Shortness of breath, Fever of 101 F. or greater or Loss of consciousness please follow up with the discharge attending Dr. Kathi Der at phone-number: [phone]    If you experience any of these symptoms 24 hours or more after discharge:Uncontrolled pain, Chest pain, Shortness of breath, Fever of 101 F. or greater or Loss of consciousness please follow up with your PCP:  Thornell Sartorius, MD [phone]
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
## 2020-09-21 ENCOUNTER — Emergency Department: Payer: Medicaid Other

## 2020-09-21 ENCOUNTER — Encounter: Payer: Self-pay | Admitting: Internal Medicine

## 2020-09-21 ENCOUNTER — Telehealth: Payer: Self-pay | Admitting: Internal Medicine

## 2020-09-21 ENCOUNTER — Inpatient Hospital Stay
Admission: EM | Admit: 2020-09-21 | Discharge: 2020-09-27 | Disposition: A | Discharge status: Home Health | Payer: Medicaid Other | Source: Ambulatory Visit | Attending: Internal Medicine | Admitting: Internal Medicine

## 2020-09-21 DIAGNOSIS — J948 Other specified pleural conditions: Secondary | ICD-10-CM

## 2020-09-21 DIAGNOSIS — F329 Major depressive disorder, single episode, unspecified: Secondary | ICD-10-CM | POA: Diagnosis present

## 2020-09-21 DIAGNOSIS — X58XXXS Exposure to other specified factors, sequela: Secondary | ICD-10-CM | POA: Diagnosis present

## 2020-09-21 DIAGNOSIS — Z87891 Personal history of nicotine dependence: Secondary | ICD-10-CM

## 2020-09-21 DIAGNOSIS — D696 Thrombocytopenia, unspecified: Secondary | ICD-10-CM | POA: Diagnosis present

## 2020-09-21 DIAGNOSIS — M810 Age-related osteoporosis without current pathological fracture: Secondary | ICD-10-CM | POA: Diagnosis present

## 2020-09-21 DIAGNOSIS — K592 Neurogenic bowel, not elsewhere classified: Secondary | ICD-10-CM | POA: Diagnosis present

## 2020-09-21 DIAGNOSIS — Z8701 Personal history of pneumonia (recurrent): Secondary | ICD-10-CM

## 2020-09-21 DIAGNOSIS — Z20822 Contact with and (suspected) exposure to covid-19: Secondary | ICD-10-CM | POA: Diagnosis present

## 2020-09-21 DIAGNOSIS — R0689 Other abnormalities of breathing: Secondary | ICD-10-CM

## 2020-09-21 DIAGNOSIS — Z87440 Personal history of urinary (tract) infections: Secondary | ICD-10-CM

## 2020-09-21 DIAGNOSIS — G8250 Quadriplegia, unspecified: Secondary | ICD-10-CM | POA: Diagnosis present

## 2020-09-21 DIAGNOSIS — N319 Neuromuscular dysfunction of bladder, unspecified: Secondary | ICD-10-CM | POA: Diagnosis present

## 2020-09-21 DIAGNOSIS — M62838 Other muscle spasm: Secondary | ICD-10-CM | POA: Diagnosis present

## 2020-09-21 DIAGNOSIS — K5909 Other constipation: Secondary | ICD-10-CM | POA: Diagnosis present

## 2020-09-21 DIAGNOSIS — D539 Nutritional anemia, unspecified: Secondary | ICD-10-CM | POA: Diagnosis present

## 2020-09-21 DIAGNOSIS — S14106S Unspecified injury at C6 level of cervical spinal cord, sequela: Secondary | ICD-10-CM

## 2020-09-21 DIAGNOSIS — J9601 Acute respiratory failure with hypoxia: Secondary | ICD-10-CM | POA: Diagnosis present

## 2020-09-21 DIAGNOSIS — J9811 Atelectasis: Secondary | ICD-10-CM | POA: Diagnosis present

## 2020-09-21 DIAGNOSIS — R918 Other nonspecific abnormal finding of lung field: Secondary | ICD-10-CM

## 2020-09-21 DIAGNOSIS — Z936 Other artificial openings of urinary tract status: Secondary | ICD-10-CM

## 2020-09-21 DIAGNOSIS — L02419 Cutaneous abscess of limb, unspecified: Secondary | ICD-10-CM

## 2020-09-21 DIAGNOSIS — G901 Familial dysautonomia [Riley-Day]: Secondary | ICD-10-CM | POA: Diagnosis present

## 2020-09-21 DIAGNOSIS — J189 Pneumonia, unspecified organism: Secondary | ICD-10-CM | POA: Diagnosis not present

## 2020-09-21 DIAGNOSIS — R000 Tachycardia, unspecified: Secondary | ICD-10-CM

## 2020-09-21 DIAGNOSIS — J9600 Acute respiratory failure, unspecified whether with hypoxia or hypercapnia: Secondary | ICD-10-CM

## 2020-09-21 DIAGNOSIS — J9691 Respiratory failure, unspecified with hypoxia: Secondary | ICD-10-CM

## 2020-09-21 LAB — RSV PCR: RSV PCR: 0

## 2020-09-21 LAB — BLOOD BANK HOLD RED

## 2020-09-21 LAB — VENOUS BLOOD GAS
Base Excess,VENOUS: -2 mmol/L (ref <=2)
Bicarbonate,VENOUS: 22 mmol/L (ref 21–28)
CO2 (Calc),VENOUS: 23 mmol/L (ref 22–31)
CO: 0.9 %
FO2 HB,VENOUS: 77 % (ref 63–83)
Hemoglobin: 13.1 g/dL (ref 11.2–15.7)
Methemoglobin: 0.8 % (ref 0.0–1.0)
PCO2,VENOUS: 36 mm Hg — ABNORMAL LOW (ref 40–50)
PH,VENOUS: 7.41 (ref 7.32–7.42)
PO2,VENOUS: 48 mm Hg — ABNORMAL HIGH (ref 25–43)

## 2020-09-21 LAB — URINALYSIS WITH MICROSCOPIC
Bacteria,UA: NONE SEEN
Glucose,UA: NEGATIVE
Hyaline Casts,UA: >20 /lpf — AB (ref 0–5)
Leuk Esterase,UA: NEGATIVE
Nitrite,UA: NEGATIVE
Specific Gravity,UA: 1.01 (ref 1.002–1.030)
pH,UA: 6.5 (ref 5.0–8.0)

## 2020-09-21 LAB — RUQ PANEL (ED ONLY)
Albumin: 3.9 g/dL (ref 3.5–5.2)
Amylase: 35 U/L (ref 28–100)
Bilirubin,Direct: <0.2 mg/dL (ref 0.0–0.3)
Bilirubin,Total: 0.5 mg/dL (ref 0.0–1.2)
Lipase: 23 U/L (ref 13–60)
Total Protein: 7.3 g/dL (ref 6.3–7.7)

## 2020-09-21 LAB — CBC AND DIFFERENTIAL
Baso # K/uL: 0 10*3/uL (ref 0.0–0.1)
Basophil %: 0 %
Eos # K/uL: 0 10*3/uL (ref 0.0–0.4)
Eosinophil %: 0 %
Hematocrit: 38 % (ref 34–45)
Hemoglobin: 12.2 g/dL (ref 11.2–15.7)
Lymph # K/uL: 1.4 10*3/uL (ref 1.2–3.7)
Lymphocyte %: 14.8 %
MCH: 31 pg (ref 26–32)
MCHC: 32 g/dL (ref 32–36)
MCV: 99 fL — ABNORMAL HIGH (ref 79–95)
Mono # K/uL: 0.6 10*3/uL (ref 0.2–0.9)
Monocyte %: 7 %
Neut # K/uL: 7 10*3/uL — ABNORMAL HIGH (ref 1.6–6.1)
Nucl RBC # K/uL: 0 10*3/uL (ref 0.0–0.0)
Nucl RBC %: 0 /100 WBC (ref 0.0–0.2)
Platelets: 92 10*3/uL — ABNORMAL LOW (ref 160–370)
RBC: 3.9 MIL/uL (ref 3.9–5.2)
RDW: 14.7 % — ABNORMAL HIGH (ref 11.7–14.4)
Seg Neut %: 78.2 %
WBC: 9 10*3/uL (ref 4.0–10.0)

## 2020-09-21 LAB — BLOOD BANK HOLD LAVENDER

## 2020-09-21 LAB — BASIC METABOLIC PANEL
Anion Gap: 20 — ABNORMAL HIGH (ref 7–16)
CO2: 18 mmol/L — ABNORMAL LOW (ref 20–28)
Calcium: 9.5 mg/dL (ref 8.8–10.2)
Chloride: 100 mmol/L (ref 96–108)
Creatinine: 0.5 mg/dL — ABNORMAL LOW (ref 0.51–0.95)
GFR,Black: 145 *
GFR,Caucasian: 126 *
Glucose: 93 mg/dL (ref 60–99)
Lab: 14 mg/dL (ref 6–20)
Sodium: 138 mmol/L (ref 133–145)

## 2020-09-21 LAB — DIFF MANUAL: Diff Based On: 115 CELLS

## 2020-09-21 LAB — LACTATE, PLASMA: Lactate: 1.2 mmol/L (ref 0.5–2.2)

## 2020-09-21 LAB — COVID-19 NAAT (PCR): COVID-19 NAAT (PCR): NEGATIVE

## 2020-09-21 LAB — MAGNESIUM: Magnesium: 1.9 mg/dL (ref 1.6–2.5)

## 2020-09-21 LAB — INFLUENZA A: Influenza A PCR: 0

## 2020-09-21 LAB — COVID-19 PCR

## 2020-09-21 LAB — HOLD SST

## 2020-09-21 LAB — PERFORMING LAB

## 2020-09-21 LAB — HOLD BLUE

## 2020-09-21 LAB — HOLD GREEN WITH GEL

## 2020-09-21 LAB — HOLD LAVENDER

## 2020-09-21 LAB — INFLUENZA B PCR: Influenza B PCR: 0

## 2020-09-21 LAB — HOLD GRAY

## 2020-09-21 LAB — PHOSPHORUS: Phosphorus: 3.9 mg/dL (ref 2.7–4.5)

## 2020-09-21 MED ORDER — MELATONIN 3 MG PO TABS *I*
3.0000 mg | ORAL_TABLET | Freq: Every day | ORAL | Status: DC
Start: 2020-09-22 — End: 2020-09-27
  Administered 2020-09-23 – 2020-09-26 (×4): 3 mg via ORAL
  Filled 2020-09-21 (×4): qty 1

## 2020-09-21 MED ORDER — SODIUM CHLORIDE 3 % IN NEBU *I*
4.0000 mL | INHALATION_SOLUTION | Freq: Four times a day (QID) | RESPIRATORY_TRACT | Status: DC | PRN
Start: 2020-09-21 — End: 2020-09-25
  Administered 2020-09-22 (×2): 4 mL via RESPIRATORY_TRACT
  Filled 2020-09-21 (×5): qty 4

## 2020-09-21 MED ORDER — GUAIFENESIN 600 MG PO TB12 *I*
600.0000 mg | ORAL_TABLET | Freq: Two times a day (BID) | ORAL | Status: DC
Start: 2020-09-21 — End: 2020-09-27
  Administered 2020-09-22 – 2020-09-27 (×11): 600 mg via ORAL
  Filled 2020-09-21 (×13): qty 1

## 2020-09-21 MED ORDER — SODIUM CHLORIDE 0.9 % FLUSH FOR PUMPS *I*
0.0000 mL/h | INTRAVENOUS | Status: DC | PRN
Start: 2020-09-21 — End: 2020-09-27

## 2020-09-21 MED ORDER — SENNOSIDES 8.6 MG PO TABS *I*
2.0000 | ORAL_TABLET | Freq: Every evening | ORAL | Status: DC
Start: 2020-09-22 — End: 2020-09-27
  Administered 2020-09-22 – 2020-09-23 (×2): 2 via ORAL
  Filled 2020-09-21 (×3): qty 2

## 2020-09-21 MED ORDER — ACETAMINOPHEN 500 MG PO TABS *I*
1000.0000 mg | ORAL_TABLET | Freq: Three times a day (TID) | ORAL | Status: DC | PRN
Start: 2020-09-21 — End: 2020-09-27

## 2020-09-21 MED ORDER — LACTATED RINGERS IV BOLUS *I*
1000.0000 mL | Freq: Once | INTRAVENOUS | Status: AC
Start: 2020-09-21 — End: 2020-09-21
  Administered 2020-09-21: 1000 mL via INTRAVENOUS

## 2020-09-21 MED ORDER — POLYETHYLENE GLYCOL 3350 PO PACK 17 GM *I*
17.0000 g | PACK | Freq: Every day | ORAL | Status: DC
Start: 2020-09-22 — End: 2020-09-27
  Filled 2020-09-21 (×3): qty 17

## 2020-09-21 MED ORDER — PIPERACILLIN/TAZOBACTAM 4.5 G IN NS MINI-BAG PLUS 110 ML *I*
4.5000 g | Freq: Once | INTRAVENOUS | Status: AC
Start: 2020-09-21 — End: 2020-09-21

## 2020-09-21 MED ORDER — DEXTROSE 5 % FLUSH FOR PUMPS *I*
0.0000 mL/h | INTRAVENOUS | Status: DC | PRN
Start: 2020-09-21 — End: 2020-09-27

## 2020-09-21 MED ORDER — FONDAPARINUX SODIUM 2.5 MG/0.5ML SC SOLN *I*
2.5000 mg | Freq: Every day | SUBCUTANEOUS | Status: DC
Start: 2020-09-21 — End: 2020-09-27
  Administered 2020-09-22 – 2020-09-27 (×6): 2.5 mg via SUBCUTANEOUS
  Filled 2020-09-21 (×7): qty 0.5

## 2020-09-21 MED ORDER — VANCOMYCIN HCL IN NACL 1500 MG/275 ML IV SOLN *I*
20.0000 mg/kg | Freq: Once | INTRAVENOUS | Status: AC
Start: 2020-09-21 — End: 2020-09-21
  Administered 2020-09-21: 1500 mg via INTRAVENOUS
  Filled 2020-09-21: qty 275

## 2020-09-21 MED ORDER — IPRATROPIUM-ALBUTEROL 0.5-2.5 MG/3ML IN SOLN *I*
3.0000 mL | Freq: Four times a day (QID) | RESPIRATORY_TRACT | Status: DC | PRN
Start: 2020-09-21 — End: 2020-09-27
  Administered 2020-09-22 (×2): 3 mL via RESPIRATORY_TRACT
  Filled 2020-09-21 (×2): qty 3

## 2020-09-21 MED ORDER — PIPERACILLIN/TAZOBACTAM 4.5 G IN NS MINI-BAG PLUS 110 ML *I*
INTRAVENOUS | Status: DC
Start: 2020-09-21 — End: 2020-09-21
  Administered 2020-09-21: 4.5 g via INTRAVENOUS
  Filled 2020-09-21: qty 110

## 2020-09-21 MED ORDER — BISACODYL 10 MG RE SUPP *I*
10.0000 mg | Freq: Every day | RECTAL | Status: DC | PRN
Start: 2020-09-21 — End: 2020-09-27
  Administered 2020-09-25: 10 mg via RECTAL

## 2020-09-21 NOTE — Progress Notes (Signed)
Pt d/c'd from 634. PIV removed. AVS and medications reviewed w/ pt. Pt acknowledged understanding and had no questions at the time. All belongings taken by pt. Pt hoyered onto home power WC. Pt brought down to home transport by RN.     Eliezer Lofts, RN

## 2020-09-21 NOTE — ED Notes (Signed)
Late note due to pt acuity. Pt continuing to sat in mid 90s on nonrebreather, exhibiting SOB and tachypnea at rest. BP improved to 90s SBP and HR decreased to 110s with two liters LR infusing. Charge, CRN, Respiratory, and Provider Weber at bedside. Pt transferred to [facility].

## 2020-09-21 NOTE — ED Notes (Addendum)
Pt care assumed. Reports being d/c yesterday from [facility] for PNA, returning today with c/o continuous nonradiating "tight" CP and SOB. Denies any other physical complaints at this time. Placed on telemetry and continuous pulse ox. Found to be satting in the low 80s on RA, placed on venti mask, satting in high 80s. Tachy to 130s, Hypotensive to 70s, provider Weber notified and at bedside placing US guided IV. Charge, CRN, and respiratory notified.
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
## 2020-09-21 NOTE — ED Notes (Signed)
1L LR started for an SBP 76/49.

## 2020-09-21 NOTE — H&P (Signed)
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 secondary to mucous plugging/atelectasis.  Hypotension now resolved, possibly secondary to known dysautonomia, less likely septic.     PLAN:     #Acute hypoxic respiratory failure: Resolving, most likely secondary to mucous plugging and associated atelectasis given history of thick mucus production and neuromuscular weakness.  Unlikely secondary to worsening multifocal pneumonia given improving sputum color, lack of fever, course of antibiotics.  Other considerations include pulmonary embolism, as she is at risk due to immobility, and tachycardia/hypotension may be suggestive of this; however, would not expect improvement as she has had thus far.  No hypercarbia on VBG.  Does not appear fluid overloaded and mprovement in respiratory status & oxygenation following fluid resuscitation points away from cardiogenic pulmonary edema however also possible.  Hold further fluid resuscitation  Supplemental oxygen, wean to maintain SPO2 of 92%  Discontinue antibiotics  If fever, start vancomycin, piperacillintazobactam, obtain MRSA nares, urine Legionella/pneumococcal antigens  Follow blood cultures  D-dimer, if elevated compared to prior/10 times age, obtain CTA to rule out pulmonary embolism  Chest PT  Incentive spirometry  Continue guaifenesin  Nebulized hypertonic  saline    #COVID-19 exposure:  Precautions until 10/02/2020  Repeat COVID-19 PCR negative    #History of heparin-induced thrombocytopenia:   DVT prophylaxis with fondaparinux    Chronic/stable conditions:  C6 quadriplegia, neurogenic bladder/bowel, status post ileal loop urostomy      F: PO  E: BMP, Mg, P daily  N: Regular diet    VTE prophylaxis: Enoxaparin.  GI/Bowel prophylaxis: PEG, senna, PRN suppository.  Lines/Drains/Catheters: PIV.  Ancillary services: None    Disposition:   Estimated discharge date: 1 to 2 days  Anticipated barriers to discharge: Hypoxia  Follow up: PCP    Code Status: Full Code      Signed by Lang Snow, MD  PGY-2, Internal Medicine  PICC [phone]  As of 09/21/2020  at 11:26 PM

## 2020-09-21 NOTE — Progress Notes (Signed)
Trialed off HFNC, respiratory effort much improved and patient states her breathing feels a lot better. Placed on 6L NC, will continue to monitor.

## 2020-09-21 NOTE — Telephone Encounter (Signed)
Received message that patient called reporting bad chest pain and SOB and would be going to ED.   Attempted to return call at [phone] but no answer.   Will notify ED that she is likely returning to ED.     She was recently admitted for hypoxia, RSV+, significant COVID exposure at home. Was put on CTX/doxy -> cefpodoxime at discharge to complete 5 day course.   CTA was done during last admission and was negative for PE.   She did receive one dose of decadron given high suspicion for COVID, but she was quickly weaned off oxygen and her COVID was negative x3 during her admission.     She had blood and urine cultures during her last admission.   1 blood culture grew staph epi at 16.3 hours and she received vancomycin x1. Her repeat cultures were negative and it was thought to be a contaminant.     Amado Nash, MD 09/21/2020 2:09 PM  Hospital Medicine Attending  [facility] [address]  Please page with questions or concerns  Some portions of this encounter were dictated using Dragon transcription software. Errors will undoubtedly be present, despite attempts to avoid them.
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
## 2020-09-21 NOTE — ED Triage Notes (Signed)
Pt presenting after being discharged yesterday, was having increased SOB hypoxic on RA. Was admitted for PNA and given home course of antibiotics.        Triage Note   Kathlen Brunswick, RN

## 2020-09-22 LAB — HOLD GRAY

## 2020-09-22 LAB — AEROBIC CULTURE

## 2020-09-22 LAB — BASIC METABOLIC PANEL
Anion Gap: 16 (ref 7–16)
CO2: 21 mmol/L (ref 20–28)
Calcium: 9.1 mg/dL (ref 8.8–10.2)
Chloride: 105 mmol/L (ref 96–108)
Creatinine: 0.38 mg/dL — ABNORMAL LOW (ref 0.51–0.95)
GFR,Black: 159 *
GFR,Caucasian: 138 *
Glucose: 82 mg/dL (ref 60–99)
Lab: 10 mg/dL (ref 6–20)
Potassium: 4 mmol/L (ref 3.3–5.1)
Sodium: 142 mmol/L (ref 133–145)

## 2020-09-22 LAB — CBC AND DIFFERENTIAL
Baso # K/uL: 0.1 10*3/uL (ref 0.0–0.1)
Basophil %: 1 %
Eos # K/uL: 0 10*3/uL (ref 0.0–0.4)
Eosinophil %: 0 %
Hematocrit: 37 % (ref 34–45)
Hemoglobin: 11.7 g/dL (ref 11.2–15.7)
Lymph # K/uL: 2.3 10*3/uL (ref 1.2–3.7)
Lymphocyte %: 17 %
MCH: 31 pg (ref 26–32)
MCHC: 32 g/dL (ref 32–36)
MCV: 96 fL — ABNORMAL HIGH (ref 79–95)
Mono # K/uL: 0.8 10*3/uL (ref 0.2–0.9)
Monocyte %: 7 %
Neut # K/uL: 8.4 10*3/uL — ABNORMAL HIGH (ref 1.6–6.1)
Nucl RBC # K/uL: 0 10*3/uL (ref 0.0–0.0)
Nucl RBC %: 0 /100 WBC (ref 0.0–0.2)
Platelets: 89 10*3/uL — ABNORMAL LOW (ref 160–370)
RBC: 3.8 MIL/uL — ABNORMAL LOW (ref 3.9–5.2)
RDW: 14.4 % (ref 11.7–14.4)
Seg Neut %: 72 %
WBC: 11.6 10*3/uL — ABNORMAL HIGH (ref 4.0–10.0)

## 2020-09-22 LAB — HEPATIC FUNCTION PANEL
ALT: 22 U/L (ref 0–35)
Albumin: 3.7 g/dL (ref 3.5–5.2)
Alk Phos: 56 U/L (ref 35–105)
Bilirubin,Direct: <0.2 mg/dL (ref 0.0–0.3)
Bilirubin,Total: 0.5 mg/dL (ref 0.0–1.2)
Total Protein: 6.9 g/dL (ref 6.3–7.7)

## 2020-09-22 LAB — PHOSPHORUS: Phosphorus: 3.7 mg/dL (ref 2.7–4.5)

## 2020-09-22 LAB — DIFF MANUAL
Diff Based On: 100 CELLS
React Lymph %: 3 % (ref 0–6)

## 2020-09-22 LAB — D-DIMER, QUANTITATIVE: D-Dimer: 0.41 ug/mL FEU (ref 0.00–0.50)

## 2020-09-22 LAB — MAGNESIUM: Magnesium: 1.9 mg/dL (ref 1.6–2.5)

## 2020-09-22 LAB — HOLD BLUE

## 2020-09-22 LAB — HOLD GREEN WITH GEL

## 2020-09-22 NOTE — Bed Hold Note (Signed)
Bed: AC-25L  Expected date:   Expected time:   Means of arrival:   Comments:  [facility]

## 2020-09-22 NOTE — Telephone (Signed)
Called patient's mom and gave updates about Ms. Wieand.

## 2020-09-22 NOTE — ED Notes (Signed)
Respiratory therapist was called to deep suction pt. Pt decline her lunch tray.

## 2020-09-22 NOTE — ED Notes (Signed)
Patient requiring more frequent deep suctioning. Respiratory placed on trach collar, humidified, at 30% and 8L. Provider paged. Patient given PRN saline nebulizer

## 2020-09-22 NOTE — ED Notes (Signed)
Report Given To  Helane Gunther RN      Descriptive Sentence / Reason for Admission   Acute onset dyspnea and cough, d/c yesterday after admission for SOB/PNA      Active Issues / Relevant Events   C6 quadriplegia   COVID NEG  page resp to suction if needed  O2 6L NC--97%      To Do List  monitor and tx per MD orders      Anticipatory Guidance / Discharge Planning  Admission for PNA/respiratory failure

## 2020-09-22 NOTE — ED Notes (Signed)
Pt arrived from [facility] on O2 6L NC. Writer place pt on monitor with sats in mid 80's. Pt placed on venti mask 50% with sats in the high 80's. Pt placed on non-re breather, tachypnic into the high 30's lower 40's, SPO2-89-93%. PRN neb given with no effect per pt. C/o "tightness" and SOB.  Charge aware and in room with pt and Probation officer. Respiratory paged.

## 2020-09-22 NOTE — ED Notes (Signed)
Pt feeling better after respiratory suctioned. Pt sleeping, easily arousable to voice.

## 2020-09-22 NOTE — ED Notes (Addendum)
Pt was move to  A hospital bed for comfort and skin integrity. Pt requested to speak with SW d/t she doesn't have a caregiver other than her son. Pt states she often eats out d/t her son is unable to cook. Pt states she doesn't have home care.
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
## 2020-09-22 NOTE — ED Notes (Signed)
Assumed care of patient. Patient is resting comfortably in bed. No acute distress or complaints at this time.

## 2020-09-22 NOTE — Progress Notes (Signed)
Writer able to wean patient from NRB to 6L NC after suctioning through stoma - it is fairly small and will only fit 67fr cannulas, not the 37fr red rubber. Patient reported at home her son also helps her manually cough, the "quad cough" as it is sometimes known.

## 2020-09-23 ENCOUNTER — Telehealth: Payer: Self-pay | Admitting: Internal Medicine

## 2020-09-23 LAB — CBC AND DIFFERENTIAL
Baso # K/uL: 0 10*3/uL (ref 0.0–0.1)
Basophil %: 0 %
Eos # K/uL: 0.2 10*3/uL (ref 0.0–0.4)
Eosinophil %: 1.8 %
Hematocrit: 34 % (ref 34–45)
Hemoglobin: 10.9 g/dL — ABNORMAL LOW (ref 11.2–15.7)
Lymph # K/uL: 2.7 10*3/uL (ref 1.2–3.7)
Lymphocyte %: 28.8 %
MCH: 30 pg (ref 26–32)
MCHC: 32 g/dL (ref 32–36)
MCV: 95 fL (ref 79–95)
Mono # K/uL: 0.4 10*3/uL (ref 0.2–0.9)
Monocyte %: 4.5 %
Neut # K/uL: 5.4 10*3/uL (ref 1.6–6.1)
Nucl RBC # K/uL: 0 10*3/uL (ref 0.0–0.0)
Nucl RBC %: 0 /100 WBC (ref 0.0–0.2)
Platelets: 88 10*3/uL — ABNORMAL LOW (ref 160–370)
RBC: 3.6 MIL/uL — ABNORMAL LOW (ref 3.9–5.2)
RDW: 14.4 % (ref 11.7–14.4)
Seg Neut %: 63.1 %
WBC: 8.6 10*3/uL (ref 4.0–10.0)

## 2020-09-23 LAB — BASIC METABOLIC PANEL
Anion Gap: 11 (ref 7–16)
CO2: 26 mmol/L (ref 20–28)
Calcium: 9.1 mg/dL (ref 8.8–10.2)
Chloride: 105 mmol/L (ref 96–108)
Creatinine: 0.42 mg/dL — ABNORMAL LOW (ref 0.51–0.95)
GFR,Black: 153 *
GFR,Caucasian: 133 *
Glucose: 110 mg/dL — ABNORMAL HIGH (ref 60–99)
Lab: 11 mg/dL (ref 6–20)
Potassium: 3.7 mmol/L (ref 3.3–5.1)
Sodium: 142 mmol/L (ref 133–145)

## 2020-09-23 LAB — DIFF MANUAL
Diff Based On: 111 CELLS
React Lymph %: 2 % (ref 0–6)

## 2020-09-23 LAB — HEPATIC FUNCTION PANEL
ALT: 22 U/L (ref 0–35)
AST: 17 U/L (ref 0–35)
Albumin: 3.5 g/dL (ref 3.5–5.2)
Alk Phos: 51 U/L (ref 35–105)
Bilirubin,Direct: <0.2 mg/dL (ref 0.0–0.3)
Bilirubin,Total: 0.3 mg/dL (ref 0.0–1.2)
Total Protein: 6.5 g/dL (ref 6.3–7.7)

## 2020-09-23 LAB — MAGNESIUM: Magnesium: 2 mg/dL (ref 1.6–2.5)

## 2020-09-23 LAB — BLOOD CULTURE: Bacterial Blood Culture: 0

## 2020-09-23 LAB — PHOSPHORUS: Phosphorus: 2.7 mg/dL (ref 2.7–4.5)
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
## 2020-09-23 NOTE — ED Notes (Signed)
At pt request writer deep suctioned pt, successful in removing secretions.

## 2020-09-23 NOTE — ED Notes (Signed)
Assumed care.  Pt sleeping, O2 on, appears comfortable, will continue to monitor and treat per provider's orders.

## 2020-09-23 NOTE — ED Notes (Signed)
Report Given To  Dominique, RN       Descriptive Sentence / Reason for Admission   Acute onset dyspnea and cough, d/c yesterday after admission for SOB/PNA      Active Issues / Relevant Events   C6 quadriplegia   COVID NEG  page resp to suction if needed  RA       To Do List  VS  Assessments  Meds per [facility]  -If we can arrange suctioning/chest PT at home as well as aide services she can be discharged soon (possibly even today). Appreciate SW and CC assistance in helping to facilitate a safe discharge plan.       Anticipatory Guidance / Discharge Planning  Admitted to Medicine service

## 2020-09-23 NOTE — ED Notes (Signed)
Patient repositioned in bed, pillows adjusted under legs.

## 2020-09-23 NOTE — ED Notes (Signed)
Pt congested and requested suctioning of stoma to clear secretions. Per sterile procedure writer suctioned pt with positive outcome.

## 2020-09-23 NOTE — ED Notes (Signed)
Report Given To  Georgina Snell, RN       Descriptive Sentence / Reason for Admission   Acute onset dyspnea and cough, d/c yesterday after admission for SOB/PNA      Active Issues / Relevant Events   C6 quadriplegia   COVID NEG  page resp to suction if needed  30% fio2, 8L       To Do List  VS  Assessments  Meds per [facility]      Anticipatory Guidance / Discharge Planning  Admitted to Medicine service

## 2020-09-23 NOTE — Telephone Encounter (Signed)
Call to pt to schedule hospital D/C appointment    Pt went back to the hospital on Saturday September 25 and is still inpatient.    Pt does not know when she will be discharged.

## 2020-09-23 NOTE — Consults (Signed)
Ostomy Care Service Teaching Note    Patient seen today for follow up ostomy care     Unit: AC-25L    Physical Exam:    Stoma:   Type:  Urostomy   Location: RMQ    Color: red,wet,viable    Protrusion: 1cm   Measures: 1" in diameter.     MCJ: well healed.    Peristomal skin condition: intact   Ostomy output: amber urine      Supplies:  Appliance: [employer] 1 3/4" Convex wafer, 1 3 /4 " Urostomy Pouch  Accessories: 2" adapt barrier rings  All supplies ordered from hospital stores     Pt is very knowledgeable in the care of her stoma and supplies she uses.        Gonzella Lex MS RN [facility]  Pager [phone]

## 2020-09-23 NOTE — ED Notes (Signed)
Pt awake.  Am labs drawn, provided ginger ale @ pt request.  No complaints at this time.  Breathing is easy.

## 2020-09-23 NOTE — Continuity of Care (Signed)
Writer received notification from the team of the pt needing aide services due to suctioning needs. After reviewing the chart, pt has a small stoma from history of trach. Pt has 126 hours of aide services via [employer] of which pt hires the staff. According to the chart, she has not had reliable aide staff since [place], her mother is unreliable and her 35 year old son is present but not obligated to provide 24/7 care. Writer attempt to contact pt's mother [name], message not left due to generic voice mail. Pt will need a 24/7 home plan, home care, vendor for DME and teaching to whomever will be providing care. Covering [facility] will continue to follow and assist with discharge planning.    Wende Crease, RN BSN  [place] Acute Care Coordinator   Phone: [phone] Pager: [phone]  [place] Coordinator: [phone]

## 2020-09-23 NOTE — ED Notes (Signed)
Report Given To  Tom, RN       Descriptive Sentence / Reason for Admission   Acute onset dyspnea and cough, d/c yesterday after admission for SOB/PNA      Active Issues / Relevant Events   C6 quadriplegia   COVID NEG  page resp to suction if needed  30% fio2, 8L       To Do List  monitor and tx per MD orders      Anticipatory Guidance / Discharge Planning  Admission for PNA/respiratory failure

## 2020-09-23 NOTE — Telephone Encounter (Signed)
-----   Message from [facility] sent at 09/23/2020  8:34 AM EDT -----  Regarding: FW: Follow up appointment    ----- Message -----  From: Nena Polio, RN  Sent: 09/20/2020  10:29 AM EDT  To: FJ0 Scheduling  Subject: Follow up appointment                            D/C tomorrow - pt will need hospital follow up within the week please.  Admitted with RSV PNA,  covid negative   Thanks,   Jeani Hawking

## 2020-09-23 NOTE — Progress Notes (Signed)
09/23/20 1228   [facility] Patient Class Review   Patient Class Review Inpatient     Patient class effective as of 09/21/2020    Parthenia Ames RN  Utilization Management  [phone]

## 2020-09-24 LAB — BASIC METABOLIC PANEL
Anion Gap: 13 (ref 7–16)
CO2: 24 mmol/L (ref 20–28)
Calcium: 9.2 mg/dL (ref 8.8–10.2)
Chloride: 100 mmol/L (ref 96–108)
Creatinine: 0.49 mg/dL — ABNORMAL LOW (ref 0.51–0.95)
GFR,Black: 146 *
GFR,Caucasian: 126 *
Glucose: 100 mg/dL — ABNORMAL HIGH (ref 60–99)
Lab: 10 mg/dL (ref 6–20)
Sodium: 137 mmol/L (ref 133–145)

## 2020-09-24 LAB — CBC AND DIFFERENTIAL
Baso # K/uL: 0.1 10*3/uL (ref 0.0–0.1)
Basophil %: 0.6 %
Eos # K/uL: 0.2 10*3/uL (ref 0.0–0.4)
Eosinophil %: 1.8 %
Hematocrit: 37 % (ref 34–45)
Hemoglobin: 11.5 g/dL (ref 11.2–15.7)
IMM Granulocytes #: 0.1 10*3/uL — ABNORMAL HIGH (ref 0.0–0.0)
IMM Granulocytes: 0.7 %
Lymph # K/uL: 4.4 10*3/uL — ABNORMAL HIGH (ref 1.2–3.7)
Lymphocyte %: 50.3 %
MCH: 30 pg (ref 26–32)
MCHC: 31 g/dL — ABNORMAL LOW (ref 32–36)
MCV: 97 fL — ABNORMAL HIGH (ref 79–95)
Mono # K/uL: 0.6 10*3/uL (ref 0.2–0.9)
Monocyte %: 7.3 %
Neut # K/uL: 3.4 10*3/uL (ref 1.6–6.1)
Nucl RBC # K/uL: 0 10*3/uL (ref 0.0–0.0)
Nucl RBC %: 0 /100 WBC (ref 0.0–0.2)
RBC: 3.8 MIL/uL — ABNORMAL LOW (ref 3.9–5.2)
RDW: 14.8 % — ABNORMAL HIGH (ref 11.7–14.4)
Seg Neut %: 39.3 %
WBC: 8.7 10*3/uL (ref 4.0–10.0)

## 2020-09-24 LAB — HEPATIC FUNCTION PANEL
ALT: 24 U/L (ref 0–35)
Albumin: 3.5 g/dL (ref 3.5–5.2)
Alk Phos: 56 U/L (ref 35–105)
Bilirubin,Direct: <0.2 mg/dL (ref 0.0–0.3)
Bilirubin,Total: 0.3 mg/dL (ref 0.0–1.2)
Total Protein: 7 g/dL (ref 6.3–7.7)

## 2020-09-24 LAB — PHOSPHORUS: Phosphorus: 3.1 mg/dL (ref 2.7–4.5)

## 2020-09-24 LAB — MAGNESIUM: Magnesium: 1.8 mg/dL (ref 1.6–2.5)

## 2020-09-24 LAB — BLOOD CULTURE: Bacterial Blood Culture: 0

## 2020-09-24 LAB — VITAMIN B12: Vitamin B12: 702 pg/mL (ref 232–1245)

## 2020-09-24 LAB — FOLATE

## 2020-09-24 NOTE — ED Notes (Signed)
Report Given To  Delilah Shan, RN      Descriptive Sentence / Reason for Admission   Acute onset dyspnea and cough, recent discharge for PNA.      Active Issues / Relevant Events   C6 quadriplegia   COVID NEG  RA      To Do List  VS  Assessments  Meds per [facility]  If we can arrange suctioning/chest PT at home as well as aide services she can be discharged soon (possibly even today). Appreciate SW and CC assistance in helping to facilitate a safe discharge plan.       Anticipatory Guidance / Discharge Planning  Admitted to Medicine service
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
## 2020-09-24 NOTE — ED Notes (Signed)
Pt requesting to be suctioned. Respiratory at bedside and suctioned with red rubber catheter. Moderated amount white thick sputum.

## 2020-09-24 NOTE — ED Notes (Signed)
Report Given To  Maddy RN      Descriptive Sentence / Reason for Admission   Acute onset dyspnea and cough, d/c yesterday after admission for SOB/PNA      Active Issues / Relevant Events   C6 quadriplegia   COVID NEG  page resp to suction if needed  RA       To Do List  VS  Assessments  Meds per [facility]  If we can arrange suctioning/chest PT at home as well as aide services she can be discharged soon (possibly even today). Appreciate SW and CC assistance in helping to facilitate a safe discharge plan.       Anticipatory Guidance / Discharge Planning  Admitted to Medicine service

## 2020-09-24 NOTE — ED Notes (Signed)
Report Given To  Kathlee Nations RN      Descriptive Sentence / Reason for Admission   Acute onset dyspnea and cough, recent discharge for PNA.      Active Issues / Relevant Events   C6 quadriplegia   COVID NEG  RA       To Do List  VS  Assessments  Meds per [facility]  If we can arrange suctioning/chest PT at home as well as aide services she can be discharged soon (possibly even today). Appreciate SW and CC assistance in helping to facilitate a safe discharge plan.       Anticipatory Guidance / Discharge Planning  Admitted to Medicine service

## 2020-09-24 NOTE — ED Notes (Signed)
Assumed care of pt, received report from RN. Call light in reach. Will continue to monitor and treat per MD orders.

## 2020-09-24 NOTE — ED Notes (Cosign Needed)
Assumed care of patient. Resting quietly.

## 2020-09-24 NOTE — ED Notes (Signed)
Assumed care of patient. Received report from previous RN. Patient resting in hospital bed, does not appear to be in any distress, chest rising and symmetrical. Bed in low and locked position, call light within reach. Will continue to monitor patient and treat per provider order.

## 2020-09-24 NOTE — ED Notes (Signed)
Report Given To  Ayse, RN      Descriptive Sentence / Reason for Admission   Acute onset dyspnea and cough, recent discharge for PNA.      Active Issues / Relevant Events   C6 quadriplegia   COVID NEG  RA      To Do List  VS  Assessments  Meds per [facility]  If we can arrange suctioning/chest PT at home as well as aide services she can be discharged soon (possibly even today). Appreciate SW and CC assistance in helping to facilitate a safe discharge plan.       Anticipatory Guidance / Discharge Planning  Admitted to Medicine service

## 2020-09-24 NOTE — ED Notes (Addendum)
Patient's old trach suctioned. Tolerated well.

## 2020-09-24 NOTE — Telephone Encounter (Signed)
Clinical care management : Patient readmitted to [facility] . SIM staff  will follow up after discharge.

## 2020-09-25 LAB — CBC AND DIFFERENTIAL
Baso # K/uL: 0 10*3/uL (ref 0.0–0.1)
Basophil %: 0.2 %
Eos # K/uL: 0.1 10*3/uL (ref 0.0–0.4)
Eosinophil %: 1.1 %
Hematocrit: 39 % (ref 34–45)
Hemoglobin: 12.7 g/dL (ref 11.2–15.7)
IMM Granulocytes #: 0 10*3/uL (ref 0.0–0.0)
IMM Granulocytes: 0.2 %
Lymph # K/uL: 3.2 10*3/uL (ref 1.2–3.7)
Lymphocyte %: 33.3 %
MCH: 31 pg (ref 26–32)
MCHC: 32 g/dL (ref 32–36)
MCV: 95 fL (ref 79–95)
Mono # K/uL: 0.6 10*3/uL (ref 0.2–0.9)
Monocyte %: 5.8 %
Neut # K/uL: 5.7 10*3/uL (ref 1.6–6.1)
Nucl RBC # K/uL: 0 10*3/uL (ref 0.0–0.0)
Nucl RBC %: 0 /100 WBC (ref 0.0–0.2)
Platelets: 145 10*3/uL — ABNORMAL LOW (ref 160–370)
RBC: 4.2 MIL/uL (ref 3.9–5.2)
RDW: 14.4 % (ref 11.7–14.4)
Seg Neut %: 59.4 %
WBC: 9.6 10*3/uL (ref 4.0–10.0)

## 2020-09-25 LAB — BASIC METABOLIC PANEL
Anion Gap: 16 (ref 7–16)
CO2: 25 mmol/L (ref 20–28)
Calcium: 9.6 mg/dL (ref 8.8–10.2)
Chloride: 102 mmol/L (ref 96–108)
Creatinine: 0.51 mg/dL (ref 0.51–0.95)
GFR,Black: 144 *
GFR,Caucasian: 125 *
Glucose: 100 mg/dL — ABNORMAL HIGH (ref 60–99)
Lab: 12 mg/dL (ref 6–20)
Potassium: 3.6 mmol/L (ref 3.3–5.1)
Sodium: 143 mmol/L (ref 133–145)

## 2020-09-25 LAB — HEPATIC FUNCTION PANEL
ALT: 23 U/L (ref 0–35)
AST: 20 U/L (ref 0–35)
Albumin: 4.1 g/dL (ref 3.5–5.2)
Alk Phos: 51 U/L (ref 35–105)
Bilirubin,Direct: <0.2 mg/dL (ref 0.0–0.3)
Bilirubin,Total: 0.3 mg/dL (ref 0.0–1.2)
Total Protein: 7.3 g/dL (ref 6.3–7.7)

## 2020-09-25 LAB — PHOSPHORUS: Phosphorus: 4.6 mg/dL — ABNORMAL HIGH (ref 2.7–4.5)

## 2020-09-25 LAB — MAGNESIUM: Magnesium: 2 mg/dL (ref 1.6–2.5)

## 2020-09-25 MED ORDER — SODIUM CHLORIDE 0.9 % IN NEBU *I*
3.0000 mL | INHALATION_SOLUTION | RESPIRATORY_TRACT | Status: DC
Start: 2020-09-25 — End: 2020-09-27
  Administered 2020-09-25 – 2020-09-27 (×8): 3 mL via RESPIRATORY_TRACT
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
## 2020-09-25 NOTE — Progress Notes (Signed)
SPIRITUAL ASSESSMENT NOTE    Pt requested a chaplain visit in the initial interview. Writer followed-up by phone on the pt's request. Pt is feeling sad, 'depressed,' not feeling supported by family, and isolated. Pt is able to communicate with her son by phone, but otherwise is feeling alone. Pt is grateful the chaplain's contact by phone.     Identified Religion: Spiritual not religious     Spiritual Concern: Isolation, Powerlessness        Patient Coping: Open discussion, Sadness     Chaplain Interventions: Normalized feelings, Prayer    Plan of Care:  No follow-up indicated. Should the need arise for spiritual/emotional support contact the chaplain on-call at [phone].    Cindy Ochoa      11:16 AM on 09/25/2020

## 2020-09-25 NOTE — Progress Notes (Signed)
Patient suctioned x4 during shift already. Remains on room air with no O2 desaturations. Patient becomes tachycardic when needing suction, but VS otherwise stable. Patient is sweating profusely as well, although afebrile. No change in mentation. MD made aware. Patient is able to make needs known.

## 2020-09-26 ENCOUNTER — Other Ambulatory Visit: Payer: Self-pay

## 2020-09-26 ENCOUNTER — Telehealth: Payer: Self-pay | Admitting: Internal Medicine

## 2020-09-26 LAB — HEPATIC FUNCTION PANEL
ALT: 20 U/L (ref 0–35)
AST: 25 U/L (ref 0–35)
Albumin: 3.7 g/dL (ref 3.5–5.2)
Alk Phos: 50 U/L (ref 35–105)
Bilirubin,Direct: <0.2 mg/dL (ref 0.0–0.3)
Bilirubin,Total: 0.3 mg/dL (ref 0.0–1.2)
Total Protein: 7.1 g/dL (ref 6.3–7.7)

## 2020-09-26 LAB — CBC AND DIFFERENTIAL
Baso # K/uL: 0 10*3/uL (ref 0.0–0.1)
Basophil %: 0.3 %
Eos # K/uL: 0.2 10*3/uL (ref 0.0–0.4)
Eosinophil %: 2.7 %
Hematocrit: 38 % (ref 34–45)
Hemoglobin: 12.3 g/dL (ref 11.2–15.7)
IMM Granulocytes #: 0 10*3/uL (ref 0.0–0.0)
IMM Granulocytes: 0.3 %
Lymph # K/uL: 3.6 10*3/uL (ref 1.2–3.7)
Lymphocyte %: 46.1 %
MCH: 30 pg (ref 26–32)
MCHC: 32 g/dL (ref 32–36)
MCV: 95 fL (ref 79–95)
Mono # K/uL: 0.5 10*3/uL (ref 0.2–0.9)
Monocyte %: 6.9 %
Neut # K/uL: 3.4 10*3/uL (ref 1.6–6.1)
Nucl RBC # K/uL: 0 10*3/uL (ref 0.0–0.0)
Nucl RBC %: 0 /100 WBC (ref 0.0–0.2)
Platelets: 147 10*3/uL — ABNORMAL LOW (ref 160–370)
RBC: 4 MIL/uL (ref 3.9–5.2)
RDW: 14.5 % — ABNORMAL HIGH (ref 11.7–14.4)
Seg Neut %: 43.7 %
WBC: 7.8 10*3/uL (ref 4.0–10.0)

## 2020-09-26 LAB — MAGNESIUM: Magnesium: 2 mg/dL (ref 1.6–2.5)

## 2020-09-26 LAB — BASIC METABOLIC PANEL
Anion Gap: 14 (ref 7–16)
CO2: 25 mmol/L (ref 20–28)
Calcium: 9.3 mg/dL (ref 8.8–10.2)
Chloride: 102 mmol/L (ref 96–108)
Creatinine: 0.49 mg/dL — ABNORMAL LOW (ref 0.51–0.95)
GFR,Black: 146 *
GFR,Caucasian: 126 *
Glucose: 101 mg/dL — ABNORMAL HIGH (ref 60–99)
Lab: 15 mg/dL (ref 6–20)
Potassium: 3.5 mmol/L (ref 3.3–5.1)
Sodium: 141 mmol/L (ref 133–145)

## 2020-09-26 LAB — PHOSPHORUS: Phosphorus: 4.1 mg/dL (ref 2.7–4.5)

## 2020-09-26 LAB — FOLATE: Folate: 10.1 ng/mL (ref >=4.6)

## 2020-09-26 LAB — BLOOD CULTURE

## 2020-09-26 MED ORDER — TRACHEOSTOMY ASPIRATOR CANISTER MISC *A*
0 refills | Status: DC
Start: 2020-09-26 — End: 2020-10-03

## 2020-09-26 MED ORDER — POLYMEM TRACH DRESSING 3-1/2"X3-1/2" PADS
MEDICATED_PAD | 0 refills | Status: DC
Start: 2020-09-26 — End: 2022-04-07
  Filled 2020-09-26: qty 1, fill #0

## 2020-09-26 MED ORDER — IPRATROPIUM-ALBUTEROL 0.5-2.5 MG/3ML IN SOLN *I*
3.0000 mL | Freq: Four times a day (QID) | RESPIRATORY_TRACT | 5 refills | Status: DC | PRN
Start: 2020-09-26 — End: 2020-10-03
  Filled 2020-09-26: qty 90, 8d supply, fill #0

## 2020-09-26 MED ORDER — GENERIC DME *A*
0 refills | Status: AC
Start: 2020-09-26 — End: ?

## 2020-09-26 MED ORDER — TRACHEOSTOMY ASPIRATOR MISC *A*
0 refills | Status: DC
Start: 2020-09-26 — End: 2020-10-03

## 2020-09-26 MED ORDER — TRACHEOSTOMY MASK MISC *A*
0 refills | Status: DC
Start: 2020-09-26 — End: 2020-10-03

## 2020-09-26 MED ORDER — SODIUM CHLORIDE 0.9 % IN NEBU *I*
3.0000 mL | INHALATION_SOLUTION | Freq: Four times a day (QID) | RESPIRATORY_TRACT | 5 refills | Status: DC | PRN
Start: 2020-09-26 — End: 2020-10-03
  Filled 2020-09-26: qty 90, 8d supply, fill #0

## 2020-09-26 MED ORDER — GUAIFENESIN 600 MG PO TB12 *I*
600.0000 mg | ORAL_TABLET | Freq: Two times a day (BID) | ORAL | 5 refills | Status: DC
Start: 2020-09-26 — End: 2020-10-16
  Filled 2020-09-26: qty 60, 30d supply, fill #0

## 2020-09-26 NOTE — Progress Notes (Signed)
Home Health Assessment     Completed by Clarice Pole RN [facility] for Charlyn Minerva RN [facility]  Phone: [phone]    Referred by:  Claretha Cooper RN [facility] On 10       Source of Information: Medical record       Home Health indicators present: Pt will need follow up at home       Barriers to discharge to be addressed: Pts Cg's will need to be taught and Independent with pt suctioning prior to discharge home       Question Patient? Yes, Spoke to Freight forwarder, ok for referral     Plan:  Writer reviewed chart and [place] from [facility] will follow up with pt. [place] care will continue to follow hospital course and arrange appropriate services at time of discharge. Thank you.    Clarice Pole RN [facility] for Charlyn Minerva RN [facility] Phone [phone]  [place] [facility] Coordinator  After hours: [phone]  Weekends/Holidays: [phone]

## 2020-09-26 NOTE — Progress Notes (Signed)
[facility]   Transportation Request Form / Physician Certification Statement     Patient Name:  Cindy Ochoa     Date of Birth:  1986-05-25   MRN: [id number]    Date of Service: 09/27/2020 Requested Time of Pick up: 2:00PM    Patient Location:  [facility], Unit  [phone]    Patient Destination:Home:  [place] [address]    Number of steps into house?: 0    Requestors Name: Liam Rogers Call Back Number: [phone]    Payor: Medicaid    Transport for (check what type of treatment or service, at least one):  Discharge    Specify what type of treatment or service: Discharge to Home     Is this treatment or service available at sending facility?:  no    Requested Mode of Transport: BLS Ambulance   Height: Height: 165 cm (5' 4.96")  Weight: Weight: 64.4 kg (142 lb)   Round Trip/One Way: One Way    VENDOR: Ambulance [facility]: fax # [phone], phone # [phone]     1. Medical condition that necessitates this mode of transport (i.e. oxygen, bed ridden, etc.): Patient requires trach suctioning    2. What medical services are to be provided by crew?: Suctioning    3. Infection control needs (i.e. ORSA/VRE/Cdiff): no    4. What specific handling is required?: Positioning  Fall Precaution    5.  Patient mental status?: Normal Cognition    6. At time of transport is bed confinement ordered?: no        Is patient bed confined? no   Medical condition for bed confinement: no    Electronic Signature: Liam Rogers      Date:  09/26/20    Physical Signature: _______________________________________       Liam Rogers, LMSW  PIC: [phone]  Phone: [phone]
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
## 2020-09-26 NOTE — Plan of Care (Addendum)
Patient requires suction (inpatient and home after discharge) for acute respiratory failure due to RSV with mucus plugging in the setting chronic of quadriparesis at C6

## 2020-09-26 NOTE — Telephone Encounter (Signed)
-----   Message from Claretha Cooper, RN sent at 09/26/2020 12:29 PM EDT -----  Pt being discharged home today; needs hospital follow visit ...thanks  Amy [phone]

## 2020-09-26 NOTE — Telephone Encounter (Signed)
Appointment has been scheduled.

## 2020-09-26 NOTE — Interdisciplinary Rounds (Signed)
Interdisciplinary Discharge Communication Note    Medically Ready for Discharge Date: 09/23/20   Targeted Discharge Disposition : Home with services    Rounding was performed on:   09/26/20 at:  2 pm    Interdisciplinary Rounding Participants: SW, Care Coordinator, Agricultural consultant, Diplomatic Services operational officer, Resident, Other (comment) [facility])    Admit Date/Time:  09/21/2020  6:23 PM     Principal Problem:  SOB, covid exposure  The patient's problem list and interdisciplinary care plan was reviewed.    Expected Date for Discharge: 09/27/2020        Discharge Planning  9/30- per MD pt med ready, will need home suctioning set up; [facility] contacted- they will need Rx from MD for suction machine and caths (MD aware) ; they will plan to deliver machine to unit 9/30 with their RT ; Pt has hired a Marine scientist pvt pay- she is here for training with bedside RN.   Targeted Disposition   Targeted Discharge Disposition : Home with services    [place], Choiced?: Yes Patient home care agency: [place] Needs  Current Home Equipment: [facility] Respiratory Home Equipment : Not Applicable Medical Supplies Available: Not Applicable Equipment/Supplies Ordered: Other (comment) (suction machine and catheters) Equipment/Supply Vendor: [facility] Vendor Phone: [phone] Vendor Fax: [phone] Delivery of D/C DME/medical supplies confirmed, if applicable:  (needs Rx faxed)      Communication  Is communication necessary with patient spokesperson? : No  Why communication is not necessary: Patient has capacity          Current PCP:  Thornell Sartorius, MD    Scheduled Future Appointments:  Follow-up Appointments: Follow-up appointment with PCP made   Future Appointments    Monday October 07, 2020 10:10 AM  Discharge Follow Up with Sherlon Handing, NP  [facility] (--) [phone]   Wednesday October 16, 2020 10:00 AM  FOLLOW UP VISIT with Ezequiel Ganser, MD  [facility] Medicine (--) [phone]            Transportation  Transportation  Setup: Complete       SW Assistance for Transportation: Yes (ambulance set up 2 pm 9/30)    Prescriptions             Edmon Magid, RN  1:52 PM

## 2020-09-26 NOTE — Progress Notes (Signed)
SW met with patient to discuss discharge planning. Patient stated that she was able to secure a nurse a PCT to start staffing her nursing hours and she is working on getting them paid through her aid company. Patient stated that she will need her nurse to come in for training for suctioning. Patient stated that she was interested in [facility] and provided verbal consent to send the referral. Patient stated that she would like mental health counseling resources.     SW submitted [facility] referral to [facility].     SW called [facility] to set up transportation. They advised to call [employer] to see if the transportation would be covered.     SW called [employer] and they stated that [employer] would not cover the transportation and advised to check to se if Medicaid would cover.     SW submitted [facility] transportation request for [facility] transport tomorrow.     SW called [facility] to see what was needed for a mental health referral and they stated they currently have a wait list for over 6 months before services can start. SW provided patient with a list of community resources for mental health services. SW let patient know that [facility] transportation was arranged for 2pm tomorrow and she was agreeable to this plan.     SW updated nursing staff and medical providers.    Liam Rogers, LMSW  PIC: [phone]  Phone: [phone]

## 2020-09-26 NOTE — Progress Notes (Signed)
Education completed on suctioning with Bridged LPN for discharge. Bridged  demonstrated suctioning understanding with Probation officer.    Lauree Chandler, RN

## 2020-09-26 NOTE — Progress Notes (Signed)
Provider notified Twice this shift regarding patient soft BP's. Fluids encouraged. Vital Stable after fluid,

## 2020-09-26 NOTE — Progress Notes (Signed)
[facility] MD CONFIRMATION    Confirmed signing [id number], Jared  MD Address: [address]  [place] [address]  MD Phone  [phone]  MD Fax:  [phone]  Confirmed by:  Per previous agreement  Date of confirmation: 09/26/20    Clarice Pole RN [facility] for Charlyn Minerva RN [facility] Phone [phone]  Economy Coordinator  After hours: [phone]  Weekends/Holidays: [phone]

## 2020-09-26 NOTE — Plan of Care (Signed)
Patient was exposed to Covid on 09/07/20 and completed her quarantine on 09/18/20. Infection control was informed and determined that the patient does not need Covid precautions.

## 2020-09-27 ENCOUNTER — Other Ambulatory Visit: Payer: Self-pay

## 2020-09-27 LAB — CBC AND DIFFERENTIAL
Baso # K/uL: 0 10*3/uL (ref 0.0–0.1)
Basophil %: 0.2 %
Eos # K/uL: 0.2 10*3/uL (ref 0.0–0.4)
Eosinophil %: 2.7 %
Hematocrit: 37 % (ref 34–45)
Hemoglobin: 12 g/dL (ref 11.2–15.7)
IMM Granulocytes #: 0 10*3/uL (ref 0.0–0.0)
IMM Granulocytes: 0.3 %
Lymph # K/uL: 2.7 10*3/uL (ref 1.2–3.7)
Lymphocyte %: 44.6 %
MCH: 31 pg (ref 26–32)
MCHC: 33 g/dL (ref 32–36)
MCV: 95 fL (ref 79–95)
Mono # K/uL: 0.5 10*3/uL (ref 0.2–0.9)
Monocyte %: 8.4 %
Neut # K/uL: 2.6 10*3/uL (ref 1.6–6.1)
Nucl RBC # K/uL: 0 10*3/uL (ref 0.0–0.0)
Nucl RBC %: 0 /100 WBC (ref 0.0–0.2)
Platelets: 156 10*3/uL — ABNORMAL LOW (ref 160–370)
RBC: 3.9 MIL/uL (ref 3.9–5.2)
RDW: 14.6 % — ABNORMAL HIGH (ref 11.7–14.4)
Seg Neut %: 43.8 %
WBC: 5.9 10*3/uL (ref 4.0–10.0)

## 2020-09-27 LAB — HEPATIC FUNCTION PANEL
ALT: 19 U/L (ref 0–35)
AST: 20 U/L (ref 0–35)
Albumin: 3.8 g/dL (ref 3.5–5.2)
Alk Phos: 46 U/L (ref 35–105)
Bilirubin,Direct: <0.2 mg/dL (ref 0.0–0.3)
Bilirubin,Total: 0.2 mg/dL (ref 0.0–1.2)
Total Protein: 6.8 g/dL (ref 6.3–7.7)

## 2020-09-27 LAB — BASIC METABOLIC PANEL
Anion Gap: 11 (ref 7–16)
CO2: 26 mmol/L (ref 20–28)
Calcium: 9.2 mg/dL (ref 8.8–10.2)
Chloride: 104 mmol/L (ref 96–108)
Creatinine: 0.46 mg/dL — ABNORMAL LOW (ref 0.51–0.95)
GFR,Black: 149 *
GFR,Caucasian: 129 *
Glucose: 106 mg/dL — ABNORMAL HIGH (ref 60–99)
Lab: 15 mg/dL (ref 6–20)
Potassium: 3.6 mmol/L (ref 3.3–5.1)
Sodium: 141 mmol/L (ref 133–145)

## 2020-09-27 LAB — MAGNESIUM: Magnesium: 2 mg/dL (ref 1.6–2.5)

## 2020-09-27 LAB — BLOOD CULTURE
Bacterial Blood Culture: 0
Bacterial Blood Culture: 0

## 2020-09-27 LAB — PHOSPHORUS: Phosphorus: 3.8 mg/dL (ref 2.7–4.5)
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
## 2020-09-27 NOTE — Progress Notes (Signed)
SW was notified by bedside nurse that patient was able to get her own transportation and [facility] was no longer needed.     SW met with patient and she confirmed that patient has her own transportation for discharge and no longer needed [facility].     SW called [facility] and cancelled the transportation that was arranged for today.    Liam Rogers, LMSW  PIC: [phone]  Phone: [phone]

## 2020-09-27 NOTE — Continuity of Care (Addendum)
Discharge planning: faxed to [facility]: script for fax machine. The machine has been delivered to the pt's room on 714.   [place] [facility] Piety) is aware of discharge to home this afternoon.     Alecia Lemming, RN, BSN  Acute Care Coordinator  Unit [id number]   Cell:[phone]  Unit:[phone]  Fax: [phone]
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
## 2020-09-27 NOTE — Progress Notes (Signed)
HOME CARE DISCHARGE PLAN    The following services have been arranged with [facility] [phone]/ [phone]:  ___X___Nursing   ______Physical Therapy   ______Occupational Therapy  ______Speech Therapist  ______Medical Social Worker  ______Home Health Aide Evaluation  ______Home Delivered Meals    The agency will call to schedule first home visit date for (CHN,PT etc) services within 24-48 hours of facility discharge, and pt/family are in agreement.    Special instructions for scheduling first visit please cll ahead with visit time    The following medical equipment and/or supplies have been arranged thru [facility] for delivery on 09/27/20  ______walker  ______wheelchair  ______commode  ______shower chair  ______Personal Emergency Response System  ______oxygen  ______cane  ______hospital bed  ______infusion supplies  ______tube feeding supplies  ______catheter supplies  ______ostomy supplies  ______wound care supplies  ______other: Suction machine and catheters    Blood draw is not applicable _X____    Focus of care and identified patient/caregiver teaching needs at home  Cardiopulmonary status, moniter trach care and reinforce teaching on suction.    Follow-up MD appts scheduled:    11 Discharge Follow Up with Sherlon Handing, NP  Monday Oct 07, 2020 10:10 AM [facility]  [address] [place] [address]  [phone]       Sohana Austell Fehlner-Alandis Bluemel, RN [place]  [phone]  Weekends/Holidays; [phone]  After Hours:  [phone], option 4  The above arrangements are based on an in-hospital evaluation. It is short-term and will be re-evaluated by the [place] Nurse in the home on a regular basis, as per physicians order.

## 2020-09-27 NOTE — Discharge Instructions (Signed)
Brief Summary of Your Hospital Course (including key procedures and diagnostic test results):    You presented to the hospital on 09/17/20 with shortness of breath and fever. Given your fevers and chest x-ray findings, we suspected a pneumonia and treated you with 5 days of antibiotics. You were found to have respiratory synctitial virus (RSV). You were treated with chest physiotherapy, nebulizer treatments, and mucinex, which greatly improved your shortness of breath.    You re-presented to the hospital on 9/25/521 with shortness of breath. Your shortness of breath was caused by mucus plugging, or thick secretions in the airway making it hard for your to breathe. You were treated with suctioning, chest physiotherapy, nebulizer treatments, and mucinex, which greatly improved your shortness of breath. We helped set you up with home nursing care and suctioning.    Your instructions:  - suction as needed  - go to the doctor appointments listed below    Recommended diet: Regular - No restrictions    Recommended activity: activity as tolerated    Wound Care: keep wound clean and dry    If you experience any of these symptoms within the first 24 hours after discharge:Uncontrolled pain, Chest pain, Shortness of breath, Fever of 101 F. or greater or Loss of consciousness please follow up with the discharge attending Dr. Kathi Der at phone-number: [phone]    If you experience any of these symptoms 24 hours or more after discharge:Uncontrolled pain, Chest pain, Shortness of breath, Fever of 101 F. or greater or Loss of consciousness please follow up with your PCP:  Thornell Sartorius, MD [phone]

## 2020-09-30 ENCOUNTER — Telehealth: Payer: Self-pay | Admitting: Internal Medicine

## 2020-09-30 NOTE — Telephone Encounter (Signed)
Review of AVS:    Date of Discharge:    09/27/2020    Reason for admission:  Respiratory insufficiency     Discharged to :   Home    On Coumadin or Warfarin:  no    Follow up appointment:  10/07/2020 10:10 AM Sherlon Handing, NP [facility]   10/16/2020 10:00 AM Ezequiel Ganser, MD [facility]   11/01/2020 1:00 PM Alena Bills, MD [facility] Medicine [facility] Rehabilitation   12/03/2020 12:45 PM MED [facility] PULMONARY LAB [facility]   12/03/2020 1:30 PM Brock Bad Emilio Aspen, MD [facility]

## 2020-09-30 NOTE — Telephone Encounter (Signed)
Care Management Transition Care Assessment    Assisting team nurses    Date of Discharge Call:  09/30/2020    Reason for admission:  Acute hypoxic respiratory failure                         Patient Assessment:    Do you have any health concerns since your discharge? :  Patient is doing well    Do you have all of your medications?  yes      Do you have any problems taking your medications?  No    Home care services?  [facility]    Are you on any anticoagulation medication?  no      Are you having any problems getting around your house ? Do you need to use an assistive device ?  Wheelchair    Do you have any issues that cause you stress ?   Not asked      Who are the most important people in your life that provide you support?  Family, mom    Transportation plan:  Patient has transportation to [facility] appt    Plan for Follow up appointment:  SIM appt 10/07/2020 at 10:10 am w/ Lavone Orn, NP - pt aware      Health Home Care Management Eligibility Assessment :    Eligible for health home care management?  Yes - application submitted for embedded HHCM on 09/26/2020    Covid 19 Testing?  Neg 09/21/2020    Covid Vaccine ?  None per chart review

## 2020-10-01 ENCOUNTER — Telehealth: Payer: Self-pay | Admitting: Student in an Organized Health Care Education/Training Program
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
## 2020-10-02 ENCOUNTER — Telehealth: Payer: Self-pay | Admitting: Internal Medicine

## 2020-10-02 ENCOUNTER — Encounter: Payer: Self-pay | Admitting: Internal Medicine
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
## 2020-10-02 NOTE — Telephone Encounter (Signed)
Bria called back from [facility] on behalf of Kathlee Nations  14 suction catheters have been sent out to patient and should arrive in two days. These have been on back order.     Patient is not currently ordered for nebulizer supplies.   Resp services would need script for anything neb related   This should say "Nebulizer and nebulizer supplies" if indicated  Mychart message sent to patient informing her of confirmation of suction catheter shipment

## 2020-10-02 NOTE — Telephone Encounter (Addendum)
Spoke with patient   Got home from the hosptial last Friday     Patient states she is new to home oxygen equipment. She has:  -Facemask for nose and mouth (same one she was already using inpatient)  -Suction machine   -Albuterol medication    She still needs:  -Oxygen tank   -More suction catheters for her trach (only has two left)    Patient states she felt unwell last night. Her secretions last night were green, had some trouble breathing  She called an ambulance, they "gave me some air"  Blood pressure was low, she states it was 60/40. Patient thinks she was dehydrated. She was lightheaded last night. Patient is unable to recheck her own blood pressure.   Patient declined EMS transport to ED, patient became tearful on call with Probation officer and states she does not want to go back to the hospital.    Patient states she feels a little better currntly than she did last night, States her breathing is better.   Denies current dizziness or light headededness, fevers or chills, chest pain, shortness of breath.     Patient requesting to see Zella Richer NP sooner than Monday. Monday is next available. Writer will discuss with D. Bilsback and return call to patient.    Writer notes patient did not require supplemental oxygen therapy at discharge and home oxygen does not appear to be ordered by inpatient providers  _________________________    Damaris Schooner with [facility] [place]  Patient does not have Oxygen tanks through [facility].  Patient does get suction catheters and equipment through [facility] was transferred to ordering department. Write left VM for Kathlee Nations requesting call back about suction catheter supplies.   _______________________    Discussed with Zella Richer NP. [name] will see patient 10/7 at 8:30am.   Spoke with patient who is agreeable to appointment time. Patient states she has her own ride and does not need transportation.   Advised patient she is not ordered for O2  therapy that writer can see. Patient does not understand how she is supposed to administer nebulizer medication without oxygen. Writer advised patient bring her nebulizer tubing and medication with her to [facility] appointment tomorrow to trouble shoot. Writer also advised Probation officer left message with [facility] to supply patient with more suction catheters.   Advised pt return to ED if symptoms worsen before [facility] appointment, pt verbalized understanding.   _____________________________    Damaris Schooner with [facility] to request home visit to help patient set up nebulizer equipment or determine what patient still needs for nebulizer administration   Mickel Baas states patient refused home care over the weekend, she told home care nurse she "does not want anyone to come into her home home and telling her what to do"  Will need new referral if patient verbalizes agreement to work with home care

## 2020-10-03 ENCOUNTER — Other Ambulatory Visit: Payer: Self-pay

## 2020-10-03 ENCOUNTER — Encounter: Payer: Self-pay | Admitting: Internal Medicine

## 2020-10-03 ENCOUNTER — Ambulatory Visit: Payer: Medicaid Other | Admitting: Internal Medicine

## 2020-10-03 VITALS — BP 100/60 | HR 101 | Temp 96.6°F | Ht 65.0 in | Wt 143.0 lb

## 2020-10-03 DIAGNOSIS — Z23 Encounter for immunization: Secondary | ICD-10-CM

## 2020-10-03 DIAGNOSIS — R059 Cough, unspecified: Secondary | ICD-10-CM

## 2020-10-03 DIAGNOSIS — Z0000 Encounter for general adult medical examination without abnormal findings: Secondary | ICD-10-CM

## 2020-10-03 MED ORDER — IPRATROPIUM-ALBUTEROL 0.5-2.5 MG/3ML IN SOLN *I*
3.0000 mL | Freq: Once | RESPIRATORY_TRACT | Status: AC
Start: 2020-10-03 — End: 2020-10-03
  Administered 2020-10-03: 3 mL via RESPIRATORY_TRACT

## 2020-10-03 MED ORDER — NON-SYSTEM MEDICATION *A*
0 refills | Status: AC
Start: 2020-10-03 — End: ?

## 2020-10-03 MED ORDER — SODIUM CHLORIDE 0.9 % IN NEBU *I*
3.0000 mL | INHALATION_SOLUTION | Freq: Four times a day (QID) | RESPIRATORY_TRACT | 5 refills | Status: DC | PRN
Start: 2020-10-03 — End: 2020-10-03

## 2020-10-03 MED ORDER — IPRATROPIUM-ALBUTEROL 0.5-2.5 MG/3ML IN SOLN *I*
3.0000 mL | Freq: Four times a day (QID) | RESPIRATORY_TRACT | 5 refills | Status: DC | PRN
Start: 2020-10-03 — End: 2021-06-18
  Filled 2020-10-03 – 2021-01-15 (×2): qty 90, 8d supply, fill #0

## 2020-10-03 MED ORDER — SODIUM CHLORIDE 0.9 % IN NEBU *I*
3.0000 mL | INHALATION_SOLUTION | Freq: Four times a day (QID) | RESPIRATORY_TRACT | 5 refills | Status: DC | PRN
Start: 2020-10-03 — End: 2021-06-18
  Filled 2020-10-03: qty 90, 8d supply, fill #0
  Filled 2021-01-15: qty 51, 5d supply, fill #0

## 2020-10-03 NOTE — Patient Instructions (Signed)
Go to the pharmacy to pick up normal saline and nebulizer solution.    I sent rx to [facility] for your suction machine and nebulizer    Keep your appointment for Monday

## 2020-10-03 NOTE — Progress Notes (Signed)
Reason For Visit: The primary encounter diagnosis was Cough. A diagnosis of Need for immunization against influenza was also pertinent to this visit.      HPI:  Cindy Ochoa is 34 y.o. year old female with autonomic dysfunction, trach, quad at c-6, pneumonia, muscle spasticity, heparin induced thrombocytopenia,   Frequent uti with urostomy, respiratory failure    Here today for hospital discharge   hospitalized 9/21-9/24 for RSV pneumonia after confirmed Covid exposure, she was Covid negative.     Re hospitalized 9/25-10/1 for acute hypoxia, respiratory failure 2/2 mucous plugging, atelectasis    She improved dramatically with deep suctioning and neb  She did not need antibiotic therapy or supplemental oxygen    Complicated by hypotension which was likely 2/2 to her dysautonomia.    She was given suction machine that only worked once, given to her a the hospital.    She has neb tubing but not nebulizer    She has limited supply of normal saline and neb solution     She refused home care    She is here with her mother today       Medications:     Current Outpatient Medications   Medication Sig    Non-System Medication Nebulizer with tubing and mask  RO 689  Respiratory insufficiency    Non-System Medication Portable suction  respiratory insufficiency  Tracheotomy   Quadriparesis    sodium chloride 0.9 % nebulizer solution Inhale 3 mLs by nebulization 4 times daily as needed for Wheezing    ipratropium-albuterol (DUONEB) 0.5-2.5mg  /105mL nebulizer solution Inhale 3 mLs by nebulization 4 times daily as needed for Wheezing or Shortness of Breath    Gauze Pads & Dressings (POLYMEM TRACH DRESSING) 3-1/2"X3-1/2" PADS Use as needed    generic DME Suction machine with all related supplies.    guaiFENesin (MUCINEX) 600 MG 12 hr tablet Take 1 tablet (600 mg total) by mouth 2 times daily  Swallow whole. Do not crush, break, or chew.    menthol-zinc oxide (CALMOSEPTINE) 0.44-20.6 % ointment Apply 3 times daily to the right  buttock wound as directed    clindamycin (CLEOCIN T) 1 % lotion Apply to affected skin on neck and under arms 1-2 times daily.    Non-System Medication Briefs/pullups, large  Ht: 5\' 5"  Wt: 143lbs  ICD code: N39.41    Non-System Medication Large underpads  Ht: 5\' 5"  Wt: 143lbs  ICD code: N39.41    bisacodyl (BISAC-EVAC) 10 MG suppository Place 1 suppository (10 mg total) rectally daily as needed    docusate sodium (COLACE) 100 MG capsule Take 1 capsule (100 mg total) by mouth 2 times daily    melatonin 3 MG Take 1 tablet (3 mg total) by mouth nightly as needed for Sleep    generic DME 1/2 inch plain packing gauze    generic DME Dispense: ROHO  Indication: Quadriplegia   ICD-10: R53.2  Duration: Lifetime  Ht Readings from Last 1 Encounters:  03/19/20 : 1.626 m (5\' 4" )   Wt Readings from Last 1 Encounters:  03/19/20 : 63.5 kg (140 lb)    hydrocortisone (ANUSOL-HC) 25 MG suppository Place 1 suppository rectally  every other night    Non-System Medication Gloves  Ht: 5\' 5"  Wt: 143lbs  ICD code: N39.41    Non-System Medication Chux pads  Ht: 5\' 5"  Wt: 143lbs  ICD code: N39.41    disposable underpads 30"x36" (CHUX) Use 6 times daily and PRN. Dx N39.42  Incontinence without sensory awareness  incontinence supply disposable Large pull ups - use up to 5 x daily  Dx N39.46    disposable gloves 1 box Dynarex PF Vinyl Gloves    patient lift Use as directed for patient lifting. For lifetime use; ICD 10: G82.54 Ht: 1.73m Wt: 76.2 kg    adjustable bath/shower seat with back For lifetime use; ICD 10: G82.54 Ht: 1.92m Wt: 76.2 kg    Non-System Medication Urostomy drainage bags 2000 cc change as needed. Dx N39.46 and  G82.54    generic DME Wafers  Dx code: N31.9  Qty: 10  Refills: 6    generic DME Urostomy Pouches  Dx Codes(s): N31.9  Qty: 20  Refills: 6    generic DME Leg bag  Dx Code: N31.9  Qty: 4  Refills: 6    generic DME Night drainage bag  Dx Code: N31.9  Qty: 4  Refills: 6    generic DME Barrier  rings  Dx Code: N31.9  Qty: 15  Refills: 6    Ostomy Supplies [employer] Urostomy two piece bag and wafer 1 1/4 "H  7/8 "V N31.9 Neuromuscular dysfunction of the bladder    Ostomy Supplies [facility] Coloplast Urostomy two piece bag and wafer 1 1/4 "H  7/8 "V N31.9 Neuromuscular dysfunction of the bladder. Use as directed.    Ostomy Supplies Pouch [employer] Urostomy two piece bag and wafer 1 1/4 "H  7/8 "V N31.9 Neuromuscular dysfunction of the bladder Use as directed    Ostomy Supplies MISC Barrier ring. Use as directed.    generic DME Repairs to hospital bed/hoyer lift  ICD 10: G82.54   Ht: 1.8m Wt: 76.2 kg    polyethylene glycol (GLYCOLAX) powder Take 17 g by mouth daily   Mix in 8 oz water or juice and drink.    senna-docusate (PERICOLACE) 8.6-50 MG per tablet Take 2 tablets by mouth daily as needed for Constipation    disposable gloves 2 boxes Disposable Medium size gloves N39.46    generic DME Urostomy drainage bags 2000 cc change as needed. Dx N39.46 and  G82.54    Gauze Pads & Dressings (ABDOMINAL PAD) 8"X10" PADS By 1 each no specified route daily   Cover buttock wounds 2x daily    Adhesive Tape (MEDIPORE SURGICAL 2"X10YD) TAPE Secure the 2 buttocks  wound dressings 2x day    Wound Dressings (HYDROFERA BLUE 4"X4") PADS Cut and moisten with saline as directed and pack in to buttock wound daily    Non-System Medication Gel overlay mattress for hospital bed - diagnosis G82.54, L89.93, L89.159    acetaminophen (MAPAP) 500 mg tablet Take 1 tablet (500 mg total) by mouth every 4-6 hours as needed   for pain    Non-System Medication Easy Tip Leg Bags 1000mg  - diagnosis G82.53 N39.46    etonogestrel (NEXPLANON) 68 MG IMPL Inject 68 mg into the skin once   Placed 10/20/16     No current facility-administered medications for this visit.       Medication list reconciled this visit    Allergies:     Allergies   Allergen Reactions    Heparin Other (See Comments)     Thrombocytopenia; HIT     Ibuprofen Swelling     Pt reports lip swelling after taking ibuprofen.     Nitrofurantoin Nausea And Vomiting       Social history      Social History     Tobacco Use    Smoking status: Former Smoker  Packs/day: 0.25     Years: 3.00     Pack years: 0.75     Types: Cigarettes     Start date: 12/09/2011     Quit date: 07/30/2016     Years since quitting: 4.1    Smokeless tobacco: Former Systems developer     Quit date: 04/29/2005    Tobacco comment: quit 07/2019   Substance Use Topics    Alcohol use: Yes       Review of Systems     10 point review of system is negative unless discussed in subjective     Physical Exam:     Physical Exam  Constitutional:       Comments: Motorized wheelchair    HENT:      Nose: Nose normal.   Neck:      Comments: Trach stoma  No trach   Cardiovascular:      Rate and Rhythm: Normal rate and regular rhythm.   Pulmonary:      Comments: Wheezing and decrease in basis noted  duo neb given  Respiratory therapy came and suction patient     Repeat lung sounds more aeration and clear   Abdominal:      General: Abdomen is flat.      Comments: Urostomy    Neurological:      Mental Status: She is alert.                      Vitals:    10/03/20 0847   BP: 100/60   BP Location: Right arm   Patient Position: Sitting   Cuff Size: adult   Pulse: 101   Temp: 35.9 C (96.6 F)   TempSrc: Temporal   SpO2: 97%   Weight: 64.9 kg (143 lb)   Height: 1.651 m (5\' 5" )     Wt Readings from Last 3 Encounters:   10/03/20 64.9 kg (143 lb)   09/27/20 62.4 kg (137 lb 9.1 oz)   09/17/20 77 kg (169 lb 12.1 oz)     BP Readings from Last 3 Encounters:   10/03/20 100/60   09/27/20 90/65   09/20/20 111/73           RESULTS:     Reviewed all pertain lab and xray reports during the visit     ASSESSMENT/PLAN:     Diagnoses and all orders for this visit:    Cough/mucous plugging   -     ipratropium-albuterol (DUONEB) 0.5-2.5mg  /75mL nebulization solution 3 mL  Suctioning   Here in the office     To our pharmacy   -     ipratropium-albuterol  (DUONEB) 0.5-2.5mg  /50mL nebulizer solution; Inhale 3 mLs by nebulization 4 times daily as needed for Wheezing or Shortness of Breath    -     sodium chloride 0.9 % nebulizer solution; Inhale 3 mLs by nebulization 4 times daily as needed for Wheezing      Need for immunization against influenza  -     Flu vaccine quadrivalent greater than or equal to 63mo preservative free IM      Respiratory therapy came from the hospital to look at her non working suction machine and could not fix it     Sent to [facility] with note and phone call to expedite delivery   -     Non-System Medication; Nebulizer with tubing and mask  RO 689  Respiratory insufficiency  -     Non-System Medication; Portable suction  respiratory insufficiency  Tracheotomy   Quadriparesis      I personally spent minutes over 100 minutes on the calendar day of the encounter, including pre and post visit work.          Patient instructions      Patient Instructions   Go to the pharmacy to pick up normal saline and nebulizer solution.    I sent rx to [facility] for your suction machine and nebulizer    Keep your appointment for Monday          Signed by Sherlon Handing, NP [facility]10/7/202110:29 AM

## 2020-10-04 ENCOUNTER — Telehealth: Payer: Self-pay

## 2020-10-04 NOTE — Telephone Encounter (Signed)
CM called patient to set up a meeting time to enroll patient into the health home program. CM and patient will meet on 10/11 at [facility].

## 2020-10-06 ENCOUNTER — Other Ambulatory Visit: Payer: Self-pay

## 2020-10-07 ENCOUNTER — Encounter: Payer: Self-pay | Admitting: Internal Medicine

## 2020-10-07 ENCOUNTER — Ambulatory Visit: Payer: Medicaid Other | Admitting: Internal Medicine

## 2020-10-07 ENCOUNTER — Telehealth: Payer: Self-pay

## 2020-10-07 VITALS — BP 132/73 | HR 108 | Temp 97.3°F

## 2020-10-07 DIAGNOSIS — G8254 Quadriplegia, C5-C7 incomplete: Secondary | ICD-10-CM

## 2020-10-07 DIAGNOSIS — M810 Age-related osteoporosis without current pathological fracture: Secondary | ICD-10-CM
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
## 2020-10-07 NOTE — Telephone Encounter (Signed)
Client agreement to receive [facility] services: Yes     Documents Reviewed/Signed: [place], RHIO, Rights and Responsibilities, Electronic Communication Consent, [place] [id number], and Crisis Plan.     Identification of Key people/providers involved in care included on [phone]: [place], [place], Ezequiel Ganser, Julaine Fusi, [place], [place] for Disability rights, Lavone Orn.        Summary of Eligibility criteria met: Patient has Quadriparesis, Autonomic Dysfunction, Recurrent UTIs, Depression, and Osteoporosis.     Assessments started/completed, with summary of findings/continued efforts required: Completed Crisis Plan.     Summary of Care Management Needs: Patient would like to get connected to a different home aid.

## 2020-10-08 ENCOUNTER — Encounter: Payer: Self-pay | Admitting: Gastroenterology

## 2020-10-08 MED ORDER — NON-SYSTEM MEDICATION *A*
0 refills | Status: AC
Start: 2020-10-08 — End: ?

## 2020-10-09 ENCOUNTER — Telehealth: Payer: Self-pay

## 2020-10-09 ENCOUNTER — Encounter: Payer: Self-pay | Admitting: Gastroenterology

## 2020-10-09 NOTE — Telephone Encounter (Signed)
Patient called CM back in regards to voicemail. CM and Patient will meet on 10/20 at [facility] to start working on care plan.

## 2020-10-09 NOTE — Telephone Encounter (Signed)
CM called patient and left a voice message with CM's phone number.

## 2020-10-10 ENCOUNTER — Other Ambulatory Visit: Payer: Self-pay | Admitting: Orthopedic Surgery

## 2020-10-10 ENCOUNTER — Encounter: Payer: Self-pay | Admitting: Gastroenterology

## 2020-10-10 DIAGNOSIS — M810 Age-related osteoporosis without current pathological fracture: Secondary | ICD-10-CM

## 2020-10-13 ENCOUNTER — Other Ambulatory Visit: Payer: Self-pay

## 2020-10-15 ENCOUNTER — Other Ambulatory Visit: Payer: Self-pay

## 2020-10-16 ENCOUNTER — Other Ambulatory Visit: Payer: Self-pay

## 2020-10-16 ENCOUNTER — Ambulatory Visit: Payer: Medicaid Other | Admitting: Student in an Organized Health Care Education/Training Program

## 2020-10-16 ENCOUNTER — Telehealth: Payer: Self-pay

## 2020-10-16 ENCOUNTER — Encounter: Payer: Self-pay | Admitting: Student in an Organized Health Care Education/Training Program

## 2020-10-16 VITALS — BP 112/62 | HR 106 | Temp 96.4°F

## 2020-10-16 DIAGNOSIS — K5909 Other constipation: Secondary | ICD-10-CM

## 2020-10-16 DIAGNOSIS — G4700 Insomnia, unspecified: Secondary | ICD-10-CM

## 2020-10-16 DIAGNOSIS — F17200 Nicotine dependence, unspecified, uncomplicated: Secondary | ICD-10-CM

## 2020-10-16 DIAGNOSIS — L732 Hidradenitis suppurativa: Secondary | ICD-10-CM

## 2020-10-16 DIAGNOSIS — J029 Acute pharyngitis, unspecified: Secondary | ICD-10-CM

## 2020-10-16 DIAGNOSIS — Z23 Encounter for immunization: Secondary | ICD-10-CM

## 2020-10-16 DIAGNOSIS — R0689 Other abnormalities of breathing: Secondary | ICD-10-CM

## 2020-10-16 DIAGNOSIS — Z993 Dependence on wheelchair: Secondary | ICD-10-CM

## 2020-10-16 MED ORDER — BISACODYL 10 MG RE SUPP *I*
10.0000 mg | Freq: Every day | RECTAL | 3 refills | Status: DC | PRN
Start: 2020-10-16 — End: 2021-10-13
  Filled 2020-10-16: qty 30, 30d supply, fill #0
  Filled 2021-01-15: qty 30, 30d supply, fill #1

## 2020-10-16 MED ORDER — MELATONIN 3 MG PO TABS *I*
3.0000 mg | ORAL_TABLET | Freq: Every evening | ORAL | 0 refills | Status: DC | PRN
Start: 2020-10-16 — End: 2021-01-15
  Filled 2020-10-16: qty 30, 30d supply, fill #0

## 2020-10-16 MED ORDER — SENNOSIDES 8.6 MG PO TABS *I*
1.0000 | ORAL_TABLET | Freq: Every day | ORAL | 3 refills | Status: DC
Start: 2020-10-16 — End: 2020-10-30
  Filled 2020-10-16: qty 30, 30d supply, fill #0

## 2020-10-16 MED ORDER — GUAIFENESIN 600 MG PO TB12 *I*
600.0000 mg | ORAL_TABLET | Freq: Two times a day (BID) | ORAL | 5 refills | Status: DC
Start: 2020-10-16 — End: 2021-04-16
  Filled 2020-10-16: qty 60, 30d supply, fill #0
  Filled 2021-01-15: qty 60, 30d supply, fill #1

## 2020-10-16 MED ORDER — POLYETHYLENE GLYCOL 3350 PO POWD *I*
17.0000 g | Freq: Every day | ORAL | 5 refills | Status: DC
Start: 2020-10-16 — End: 2021-06-18
  Filled 2020-10-16: qty 510, 30d supply, fill #0
  Filled 2020-10-16: qty 238, 14d supply, fill #0
  Filled 2021-01-15: qty 510, 30d supply, fill #1

## 2020-10-16 MED ORDER — CLINDAMYCIN PHOSPHATE 1 % EX LOTN *I*
TOPICAL_LOTION | CUTANEOUS | 5 refills | Status: DC
Start: 2020-10-16 — End: 2021-04-16
  Filled 2020-10-16: qty 60, 30d supply, fill #0
  Filled 2021-01-15: qty 60, 30d supply, fill #1

## 2020-10-16 NOTE — Patient Instructions (Addendum)
For your lungs, please go to your Pulmonology appointment on 12/7 to see what they can do to help.     COVID vaccine:  - pharmacies  - [email or web address]    I am going to put in a referral for physical therapy.     I am prescribing miralax (which you can take once a day, increase to twice a day, or three times a day) and senna for constipation. I am refilling your dulcolax suppository. I think we should stop the colace.

## 2020-10-16 NOTE — Telephone Encounter (Signed)
CM and patient met today at [facility]. CM completed comprehensive assessment with patient. Patient is currently getting connected to a new nurse home aid, and CM will work in collaboration with them. Patient is also interested in furthering her education. CM will work with patient in furthering education.
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
## 2020-10-17 ENCOUNTER — Ambulatory Visit: Payer: Medicaid Other | Attending: Pediatrics

## 2020-10-17 DIAGNOSIS — Z23 Encounter for immunization: Secondary | ICD-10-CM | POA: Insufficient documentation

## 2020-10-22 ENCOUNTER — Telehealth: Payer: Self-pay

## 2020-10-22 NOTE — Telephone Encounter (Signed)
CM attempted to reach patient and left a voice message.

## 2020-10-30 ENCOUNTER — Telehealth: Payer: Self-pay

## 2020-10-30 ENCOUNTER — Ambulatory Visit: Payer: Medicaid Other | Admitting: Student in an Organized Health Care Education/Training Program

## 2020-10-30 ENCOUNTER — Other Ambulatory Visit: Payer: Self-pay

## 2020-10-30 ENCOUNTER — Encounter: Payer: Self-pay | Admitting: Student in an Organized Health Care Education/Training Program

## 2020-10-30 VITALS — Temp 96.6°F

## 2020-10-30 DIAGNOSIS — G479 Sleep disorder, unspecified: Secondary | ICD-10-CM

## 2020-10-30 DIAGNOSIS — K5909 Other constipation: Secondary | ICD-10-CM

## 2020-10-30 MED ORDER — SENNOSIDES 8.6 MG PO TABS *I*
2.0000 | ORAL_TABLET | Freq: Two times a day (BID) | ORAL | 2 refills | Status: DC
Start: 2020-10-30 — End: 2021-10-13
  Filled 2020-10-30 – 2021-10-13 (×3): qty 120, 30d supply, fill #0

## 2020-10-30 NOTE — Patient Instructions (Addendum)
For your pap smear, please contact ob/gyn.    I sent more senna to the pharmacy. For the miralax, try mixing it with juice. Please take twice a day.     Try drawing to pass the time. Try to stay awake during the day and sleep at night. You can still use the melatonin.

## 2020-10-30 NOTE — Telephone Encounter (Signed)
CM met with patient today at [facility]. CM provided information on [employer] program and will e-mail registration link to patient. CM and patient also reviewed and signed care plan together. Patient also requested scheduled transportation for upcomming appointments in December. CM will schedule rides and send confirmation to patient.

## 2020-10-30 NOTE — Progress Notes (Signed)
Internal Medicine Clinic Progress Note    Reason For Visit:   Chief Complaint   Patient presents with    Follow-up       Subjective:     History of Present Illness:  Cindy Ochoa is 34 y.o. female with a PMHx of autonomic dysfunction, constipation, sacral decub, quadriplegic C6, respiratory failure, hx of trach, frequent UTIs, presents today for follow up.     Sore throat  - resolved last Monday  - says was drinking a lot more   - wondered if throat was too dry  - no longer doing mucinex since throat better     COVID vaccine  - 10/17/20 first dose    Wheelchair  - she reports she still needs therapy for evaluation, appointment is on 11/12 to meet for a new power wheelchair    Constipation  - wonders if she should eat some more vegetables  - currently eating a meal once a day and a snack. Before used to be 2 meals a day and a snack  - had a small BM yesterday, her mother did a rectal exam without evidence of stool  - had a large BM prior to this on Saturday  - usually does not have a BM around the time of her period (is on nexplanon)  - reports going every 2 days the week before with a suppository that she had little   - suppositories every other day  - senna doing 2 tabs BID  - eats steak, mashed potatoes for dinner, really likes boiled or scrambled eggs with breakfast    Hx of marijuana use and cigarette use  - says is not used to not smoking weed where she used to have more of an appetite  - is not snacking as much  - reports has been sleeping a lot due to boredom    Insomnia  - no longer having troubles with sleeping at night  - is in fact sleeping during the day    Aide services have not started due to switching companies  - having problem with insurance company sending authorization code to the new company  - currently her mother is helping her but she also likes to have her independence     Patient's medications, allergies, past medical, surgical, social and family histories were reviewed and updated as  appropriate.    Outpatient Medications Marked as Taking for the 10/30/20 encounter (Office Visit) with Ezequiel Ganser, MD   Medication Sig Dispense Refill    senna (SENOKOT) 8.6 mg tablet Take 2 tablets by mouth 2 times daily 360 tablet 2    bisacodyl (BISAC-EVAC) 10 mg suppository Place 1 suppository (10 mg total) rectally daily as needed 50 suppository 3    clindamycin (CLEOCIN T) 1 % lotion Apply to affected skin on neck and under arms 1-2 times daily. 60 mL 5    [DISCONTINUED] senna (SENOKOT) 8.6 mg tablet Take 1 tablet by mouth daily (Patient taking differently: Take 2 tablets by mouth 2 times daily  ) 90 tablet 3    etonogestrel (NEXPLANON) 68 MG IMPL Inject 68 mg into the skin once   Placed 10/20/16         Allergies:     Allergies   Allergen Reactions    Heparin Other (See Comments)     Thrombocytopenia; HIT    Ibuprofen Swelling     Pt reports lip swelling after taking ibuprofen.     Nitrofurantoin Nausea And Vomiting  Review of Systems:     As per HPI. Otherwise, 10 point ROS unremarkable.    Physical Exam:     Vitals:    10/30/20 1303   Temp: 35.9 C (96.6 F)     Vitals:    10/30/20 1303   Temp: 35.9 C (96.6 F)   TempSrc: Temporal     Wt Readings from Last 3 Encounters:   10/03/20 64.9 kg (143 lb)   09/27/20 62.4 kg (137 lb 9.1 oz)   09/17/20 77 kg (169 lb 12.1 oz)     BP Readings from Last 3 Encounters:   10/16/20 112/62   10/07/20 132/73   10/03/20 100/60       Physical Exam:    Declined BP cuff reading due to feeling too cold  General: pleasant female, in no acute distress, bundled in winter coat and towel, having some sweating  HEENT: Sclerae anicteric.   Lungs: CTA b/l without wheezes or crackles  Heart: rrr, no murmurs appreciated  Abdomen: Non-distended. Normoactive bowel sounds. Soft, non-tender. Urostomy in place.   Extremities: No bilateral LE edema.  Neurologic: Fully oriented to conversation. Wheelchair bound. Able to move bilateral UEs.     Assessment and Plan:     Cindy Ochoa is a  34 y.o. female, with a PMHx of autonomic dysfunction, constipation, sacral decub, quadriplegic C6, respiratory failure, hx of trach, frequent UTIs, presents today for follow up.     Cindy Ochoa was seen today for follow-up.    Diagnoses and all orders for this visit:    Constipation, chronic  Patient has continued constipation. She does not like the taste of miralax. Encouraged her to drink with juice to try to help with the flavor. Should do miralax BID. Have refilled her senna.  -     senna (SENOKOT) 8.6 mg tablet; Take 2 tablets by mouth 2 times daily    Sleep disturbance  Appears is sleeping during the day now due to boredom. No longer has trouble sleeping at night. Using melatonin PRN. Discussed strategies of things to do during the day.    Congratulated on getting her first dose of the COVID vaccine. Has 2nd dose scheduled.    Health Maintenance:  - Pap Smear - discussed should schedule with ob/gyn as due this yr  - Immunizations - completed    [facility] Smoking Cessation Plan    Discussed smoking cessation with patient. Patient readiness to quit: Yes  Discussed/counseled with patient smoking cessation plan according to USPSTF guidelines: Commended for quitting. Reviewed interventions to prevent relapses.    Follow up in 2 months, or sooner if needed.    Ezequiel Ganser, MD  PGY-3 Internal Medicine  10/30/2020 1:08 PM

## 2020-11-01 ENCOUNTER — Ambulatory Visit: Payer: Medicaid Other | Admitting: Physical Medicine and Rehabilitation

## 2020-11-07 ENCOUNTER — Ambulatory Visit: Payer: Medicaid Other | Attending: Pediatrics

## 2020-11-07 DIAGNOSIS — Z23 Encounter for immunization: Secondary | ICD-10-CM | POA: Insufficient documentation

## 2020-11-09 ENCOUNTER — Other Ambulatory Visit: Payer: Self-pay

## 2020-11-14 ENCOUNTER — Other Ambulatory Visit: Payer: Self-pay | Admitting: Student in an Organized Health Care Education/Training Program

## 2020-11-14 DIAGNOSIS — N3941 Urge incontinence: Secondary | ICD-10-CM

## 2020-11-14 MED ORDER — NON-SYSTEM MEDICATION *A*
5 refills | Status: AC
Start: 2020-11-14 — End: ?

## 2020-11-18 ENCOUNTER — Telehealth: Payer: Self-pay

## 2020-11-18 NOTE — Telephone Encounter (Signed)
Patient reached out to CM today and requested transportation for an upcoming appointment. CM scheduled transportation and updated patient.

## 2020-11-25 ENCOUNTER — Encounter: Payer: Self-pay | Admitting: Internal Medicine

## 2020-11-26 ENCOUNTER — Telehealth: Payer: Self-pay

## 2020-11-26 ENCOUNTER — Ambulatory Visit: Payer: Medicaid Other | Admitting: Student in an Organized Health Care Education/Training Program

## 2020-11-26 DIAGNOSIS — Z01812 Encounter for preprocedural laboratory examination: Secondary | ICD-10-CM

## 2020-11-26 NOTE — Telephone Encounter (Signed)
Writer sent a [employer] to [place] regarding covid screening instructions prior to appointment on 12/7.Elonda Husky, RN

## 2020-11-26 NOTE — Telephone Encounter (Signed)
Patient called CM requesting transportation for a new appointment. CM will schedule transportation and update patient.

## 2020-11-27 ENCOUNTER — Other Ambulatory Visit: Payer: Self-pay

## 2020-11-27 ENCOUNTER — Ambulatory Visit: Payer: Medicaid Other | Admitting: Internal Medicine

## 2020-11-27 VITALS — BP 138/84 | HR 95 | Temp 97.7°F

## 2020-11-27 DIAGNOSIS — L0211 Cutaneous abscess of neck: Secondary | ICD-10-CM

## 2020-11-27 MED ORDER — ACETAMINOPHEN 500 MG PO TABS *I*
500.0000 mg | ORAL_TABLET | ORAL | 5 refills | Status: DC | PRN
Start: 2020-11-27 — End: 2021-06-24
  Filled 2020-11-27: qty 180, 30d supply, fill #0
  Filled 2021-01-15: qty 180, 30d supply, fill #1

## 2020-11-27 MED ORDER — DOXYCYCLINE HYCLATE 100 MG PO TABS *I*
100.0000 mg | ORAL_TABLET | Freq: Two times a day (BID) | ORAL | 0 refills | Status: DC
Start: 2020-11-27 — End: 2020-12-04
  Filled 2020-11-27: qty 14, 7d supply, fill #0
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
## 2020-11-27 NOTE — Patient Instructions (Signed)
Take doxycycline 100mg  twice a day with food for 7 days    Referral to see a general surgeon has been placed. They will call you to schedule an appointment. If the symptoms get worse, call clinic.

## 2020-11-28 ENCOUNTER — Telehealth: Payer: Self-pay

## 2020-11-28 ENCOUNTER — Telehealth: Payer: Self-pay | Admitting: Pulmonary and Critical Care Medicine

## 2020-11-28 NOTE — Telephone Encounter (Signed)
Copied from [place] [id number]. Topic: Appointments - Appointment Information  >> Nov 28, 2020  2:06 PM Bluford Main wrote:  Patient was unable to schedule transportation for the breathing test, would like to know if the breathing test could be done at later time and have her still come in to see Dr. Brock Bad. Patients appointment is on 12/7.    Patient can be reached at [phone]

## 2020-11-28 NOTE — Telephone Encounter (Signed)
Writer left a vm for [name] regarding Covid screen prior to appointment on 12/7. Information provided for [place] to be screened between 12/2 through 12/4.  Instructed to call clinic if unable to be screened so appointment can be rescheduled.   Elonda Husky, RN

## 2020-11-29 NOTE — Addendum Note (Signed)
Addended by: Williemae Natter on: 11/29/2020 08:14 AM     Modules accepted: Level of Service

## 2020-12-03 ENCOUNTER — Ambulatory Visit: Payer: Medicaid Other | Admitting: Pulmonary and Critical Care Medicine

## 2020-12-03 ENCOUNTER — Institutional Professional Consult (permissible substitution): Payer: Medicaid Other | Admitting: Surgery

## 2020-12-04 ENCOUNTER — Ambulatory Visit
Payer: Medicaid Other | Attending: Physical Medicine and Rehabilitation | Admitting: Physical Medicine and Rehabilitation

## 2020-12-04 ENCOUNTER — Encounter: Payer: Self-pay | Admitting: Physical Medicine and Rehabilitation

## 2020-12-04 ENCOUNTER — Telehealth: Payer: Self-pay

## 2020-12-04 VITALS — BP 138/88 | HR 122 | Temp 99.1°F | Ht 65.0 in | Wt 143.0 lb

## 2020-12-04 DIAGNOSIS — M62838 Other muscle spasm: Secondary | ICD-10-CM | POA: Insufficient documentation

## 2020-12-04 MED ORDER — ONABOTULINUMTOXINA 100 UNIT IJ SOLR *I*
100.0000 [IU] | Freq: Once | INTRAMUSCULAR | Status: AC | PRN
Start: 2020-12-04 — End: 2020-12-04
  Administered 2020-12-04: 100 [IU] via INTRAMUSCULAR

## 2020-12-04 NOTE — Telephone Encounter (Signed)
CM attempted to reach out to patient and left a voice message. CM will continue to reach out.

## 2020-12-04 NOTE — Procedures (Signed)
PMR Botox Injections  Performed by: Alena Bills, MD  Authorized by: Alena Bills, MD     Date/time: 12/04/2020 11:30 AM EST  Injections:  21117 - Chemodenervation 1 ext 1-4 muscles and 64643 - Chemodenervation 1 ext 1-4 muscles, 1st addtl  Medication:  100 units botulinum toxin type A 100 units; 100 units botulinum toxin type A 100 units; 100 units botulinum toxin type A 100 units; 100 units botulinum toxin type A 100 units; 100 units botulinum toxin type A 100 units; 100 units botulinum toxin type A 100 units     See below      Procedure Details   The risks, benefits, indications, potential complications, and alternatives were explained to the patient and/or guardian who verbalized understanding and informed consent obtained.     The area for injection was identified and a time out called to re-identify.      The correct patient was identified with 2 identifiers The correct procedure, location site(s) and laterality were identified with the patient and/or guardian and correspond to the consent form  The appropriate site was marked and verified.  The patient was placed in the correct position.    After prepping the skin with alcohol overlying the following muscles, botlinum toxin was injected intramuscularly as follows.     Right and left quadriceps (vastus lateralis 75, vastus medialis 75, vastus intermedius 75, rectus femoris 75) 300 + 300 units      Botulinum toxin Lot #: [id number]  Botulinum toxin expiration date: 01/2023  Total botox units injected: 600 units   Total botox units wasted: 0 units     The patient tolerated the procedure without complications.    Patient will follow up in 12 weeks for repeat botox injection       Alena Bills, MD

## 2020-12-11 ENCOUNTER — Telehealth: Payer: Self-pay

## 2020-12-11 NOTE — Telephone Encounter (Signed)
Patient reached out to CM over text message. Patient requested transportation for her upcoming appointments. CM scheduled all required transportation and sent the confirmations to patient. Patient will reach out with any other concerns.

## 2020-12-12 ENCOUNTER — Encounter: Payer: Self-pay | Admitting: Gastroenterology

## 2020-12-17 ENCOUNTER — Other Ambulatory Visit: Payer: Medicaid Other

## 2020-12-18 ENCOUNTER — Encounter: Payer: Self-pay | Admitting: Student in an Organized Health Care Education/Training Program

## 2020-12-18 ENCOUNTER — Ambulatory Visit: Payer: Medicaid Other | Admitting: Student in an Organized Health Care Education/Training Program

## 2020-12-19 ENCOUNTER — Ambulatory Visit: Payer: Medicaid Other | Admitting: Student in an Organized Health Care Education/Training Program

## 2020-12-23 ENCOUNTER — Encounter: Payer: Self-pay | Admitting: Gastroenterology

## 2020-12-31 ENCOUNTER — Telehealth: Payer: Self-pay

## 2020-12-31 ENCOUNTER — Ambulatory Visit: Payer: Medicaid Other | Admitting: Orthopedic Surgery

## 2020-12-31 NOTE — Telephone Encounter (Signed)
CM attempted to call patient, and patient did not answer. CM left a message and will continue outreach.

## 2021-01-01 ENCOUNTER — Ambulatory Visit: Payer: Medicaid Other | Admitting: Student in an Organized Health Care Education/Training Program

## 2021-01-02 ENCOUNTER — Telehealth: Payer: Self-pay

## 2021-01-02 DIAGNOSIS — Z789 Other specified health status: Secondary | ICD-10-CM

## 2021-01-02 NOTE — Telephone Encounter (Signed)
CM called patient to check up on her Covid exposure. Patient stated she didn't have many symptoms and was feeling well. CM then scheduled transportation for patients upcomming appointment, and updated patient. CM scheduled through [place], confirmation number [id number]. Patient also wanted to schedule a time for her to get the MMR vaccine. CM will reach out to patients PCP regarding this concern.

## 2021-01-03 NOTE — Addendum Note (Signed)
Addended by: Ezequiel Ganser on: 01/03/2021 03:06 PM     Modules accepted: Orders

## 2021-01-03 NOTE — Telephone Encounter (Signed)
Spoke to patient and rescheduled patient's missed appointment from this week to 1/21 @ 110 pm  Patient states I already have an appointment that day so my transportation is already arranged  Patient wanting to get her MMR vaccine  Patient has no record of whether she received in past  Will ask provider to order titers if appropriate so patient can get drawn prior to next appointment and if negative receive the vaccine at that time  Patient is wanting to go back to school  Will ask provider to order if appropriate

## 2021-01-15 ENCOUNTER — Other Ambulatory Visit: Payer: Self-pay

## 2021-01-15 ENCOUNTER — Other Ambulatory Visit: Payer: Self-pay | Admitting: Student in an Organized Health Care Education/Training Program

## 2021-01-15 DIAGNOSIS — G4700 Insomnia, unspecified: Secondary | ICD-10-CM

## 2021-01-15 MED ORDER — MELATONIN 3 MG PO TABS *I*
3.0000 mg | ORAL_TABLET | Freq: Every evening | ORAL | 0 refills | Status: DC | PRN
Start: 2021-01-15 — End: 2021-06-18
  Filled 2021-01-15: qty 30, 30d supply, fill #0

## 2021-01-15 NOTE — Telephone Encounter (Signed)
Medication request routed to Dr. Sander Nephew for processing 01/15/2021 11:16 AM    If appropriate, please refill the script for 90 days.

## 2021-01-17 ENCOUNTER — Other Ambulatory Visit
Admission: RE | Admit: 2021-01-17 | Discharge: 2021-01-17 | Disposition: A | Discharge status: Home / Self Care | Payer: Medicaid Other | Source: Ambulatory Visit | Attending: Internal Medicine | Admitting: Internal Medicine

## 2021-01-17 ENCOUNTER — Ambulatory Visit: Payer: Medicaid Other

## 2021-01-17 ENCOUNTER — Ambulatory Visit: Payer: Medicaid Other | Admitting: Pulmonology

## 2021-01-17 ENCOUNTER — Ambulatory Visit: Payer: Medicaid Other | Admitting: Student in an Organized Health Care Education/Training Program

## 2021-01-17 ENCOUNTER — Encounter: Payer: Self-pay | Admitting: Pulmonology

## 2021-01-17 VITALS — BP 179/99 | HR 114 | Temp 98.2°F | Ht 65.0 in | Wt 135.0 lb

## 2021-01-17 DIAGNOSIS — J690 Pneumonitis due to inhalation of food and vomit: Secondary | ICD-10-CM

## 2021-01-17 DIAGNOSIS — Z789 Other specified health status: Secondary | ICD-10-CM | POA: Insufficient documentation

## 2021-01-17 DIAGNOSIS — R0600 Dyspnea, unspecified: Secondary | ICD-10-CM

## 2021-01-17 NOTE — Patient Instructions (Signed)
-  Please obtain the chest xray ordered. We will call you if there are abnormal findings.    -Please continue to refrain from smoking.    -Obtain your COVID booster when eligible.    -Call us for worsening symptoms. Otherwise, we will see you if needed only.
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
## 2021-01-19 LAB — RUBELLA ANTIBODY, IGG: Rubella IgG AB: POSITIVE

## 2021-01-19 LAB — MUMPS ANTIBODY, IGG: Mumps IgG: POSITIVE

## 2021-01-19 LAB — MEASLES IGG AB: Measles IgG: POSITIVE

## 2021-01-21 ENCOUNTER — Other Ambulatory Visit: Payer: Self-pay

## 2021-01-22 ENCOUNTER — Encounter: Payer: Self-pay | Admitting: Gastroenterology

## 2021-01-23 ENCOUNTER — Encounter: Payer: Self-pay | Admitting: Student in an Organized Health Care Education/Training Program

## 2021-01-25 ENCOUNTER — Other Ambulatory Visit: Payer: Self-pay

## 2021-01-30 ENCOUNTER — Other Ambulatory Visit: Payer: Self-pay

## 2021-01-31 ENCOUNTER — Other Ambulatory Visit: Payer: Self-pay

## 2021-01-31 ENCOUNTER — Encounter: Payer: Self-pay | Admitting: Student in an Organized Health Care Education/Training Program

## 2021-01-31 NOTE — Telephone Encounter (Signed)
See my chart communication.

## 2021-02-06 ENCOUNTER — Other Ambulatory Visit: Payer: Self-pay

## 2021-02-06 NOTE — Progress Notes (Signed)
CM called patient and left a voice message. CM will continue outreach.

## 2021-02-06 NOTE — Progress Notes (Signed)
Patient returned CM's call today. Patient stated she has been having difficulty getting in contact with [employer] regarding her accomodation letter that she sent. CM messaged patients provider requesting them to fax this letter to [facility]. Patients provider stated they wouldn't be able to fax the letter however, they could mail a copy to patient. CM then updated patient. CM also attempted to call [employer] to confirm they received patients letter. [employer] was unable to discuss this with CM however, they provided a call back number patient could use. CM then sent this information to patient.

## 2021-02-11 ENCOUNTER — Telehealth: Payer: Self-pay | Admitting: Internal Medicine

## 2021-02-11 NOTE — Telephone Encounter (Signed)
Message noted.

## 2021-02-11 NOTE — Telephone Encounter (Signed)
Copied from [place] #[id number]. Topic: Access to Care - Speak to Provider/Office Staff  >> Feb 11, 2021  3:40 PM Brendia Sacks wrote:  Lenor Coffin -[employer] approval for extension CDPAS hours.    Extended 01/28/2021 to 04/28/2021.    126 hours per week (it is the same she has been getting).    Letter to follow.    #[id number] authorization     Patient also approved for emergency response system has been approved.  01/28/2021 - 05/27/2021.  #[id number]    Lenor Coffin can be reached at [phone], if needed.

## 2021-02-12 ENCOUNTER — Telehealth: Payer: Self-pay | Admitting: Internal Medicine

## 2021-02-12 NOTE — Telephone Encounter (Signed)
Copied from [place] #[id number]. Topic: Access to Care - Labs/Orders/Imaging  >> Feb 12, 2021 11:50 AM Gabriel Cirri wrote:  Thomasena Edis from [employer] calling to notify that there is approval for motor wheel chair. Will send latter for confirmation.    Please call if necessary: [phone]

## 2021-02-12 NOTE — Telephone Encounter (Signed)
Message noted.

## 2021-02-14 ENCOUNTER — Encounter: Payer: Self-pay | Admitting: Internal Medicine

## 2021-02-19 ENCOUNTER — Other Ambulatory Visit: Payer: Self-pay

## 2021-02-19 NOTE — Progress Notes (Signed)
Patients insurance contacted CM today. Patient insurance needed an updated [id number] and plan of care for patient. CM emailed it over to the contact person for patient.

## 2021-02-24 ENCOUNTER — Telehealth: Payer: Self-pay | Admitting: Internal Medicine

## 2021-02-24 ENCOUNTER — Other Ambulatory Visit: Payer: Self-pay

## 2021-02-24 NOTE — Progress Notes (Signed)
Patient reached out to CM requesting CM to attend an upcoming appointment with her. CM confirmed with patient about attending this appointment. CM also offered to drop off food today for patient from [employer]. Patient stated that this food would help and CM dropped it off for her.

## 2021-02-24 NOTE — Telephone Encounter (Signed)
Copied from [place] [phone]. Topic: Access to Care - Speak to Provider/Office Staff  >> Feb 24, 2021  1:30 PM Verda Cumins wrote:  Luetta Nutting from [facility] is requesting more specifics on special accommodations patient needs due to her blindness.  Examples: if patient needs large print, someone to read to her, etc.      See 2/4 letter    Joylene Igo [phone]    Amber's  contact number is [phone]

## 2021-02-25 ENCOUNTER — Encounter: Payer: Self-pay | Admitting: Student in an Organized Health Care Education/Training Program

## 2021-02-25 ENCOUNTER — Telehealth: Payer: Self-pay

## 2021-02-25 NOTE — Telephone Encounter (Signed)
lvm to call and resch

## 2021-02-27 ENCOUNTER — Ambulatory Visit: Payer: Medicaid Other | Admitting: Student in an Organized Health Care Education/Training Program

## 2021-02-27 ENCOUNTER — Other Ambulatory Visit: Payer: Self-pay

## 2021-02-27 ENCOUNTER — Encounter: Payer: Self-pay | Admitting: Student in an Organized Health Care Education/Training Program

## 2021-02-27 ENCOUNTER — Encounter: Payer: Self-pay | Admitting: Gastroenterology

## 2021-02-27 VITALS — BP 112/62 | HR 101 | Temp 97.0°F

## 2021-02-27 DIAGNOSIS — Z9889 Other specified postprocedural states: Secondary | ICD-10-CM

## 2021-02-27 DIAGNOSIS — K5909 Other constipation: Secondary | ICD-10-CM

## 2021-02-27 DIAGNOSIS — Z87440 Personal history of urinary (tract) infections: Secondary | ICD-10-CM

## 2021-02-27 DIAGNOSIS — R0689 Other abnormalities of breathing: Secondary | ICD-10-CM

## 2021-02-27 NOTE — Progress Notes (Signed)
Internal Medicine Clinic Progress Note    Reason For Visit:   Chief Complaint   Patient presents with    Follow-up       Subjective:     History of Present Illness:  Cindy Ochoa is 35 y.o. female with a PMHx of autonomic dysfunction, constipation, sacral decub, quadriplegic C6, respiratory failure,hx oftrach, frequent UTIs, presents today forfollow up.    Saw pulmonlogy  - they wanted her to repeat CXR.  - They asked about her trach site. She says trach site was not closed as worried that if closed, how the breathing would be after that as she's gotten adjusted with it not. Worries if she would need it cut up again if she gets PNA   - reports had a small cold at the beginning of last month but then went away 3 days after    Quit smoking Sept or Oct 2021    Has appointment June 2 with Ob/gyn for pap smear    Reports bought enemas and that is working better for constipation. Does it every other day. Likes it more than the suppositories    Hoping to get GED. Recently sent another letter with clarification about blindness and also about wheelchair needs    Needs [employer] paperwork. For aides, they help her with showers, meal prep, housekeeping, taking her to her appointments, reports needs help with ROM. Hoyer transfer.   Does not need help with feeding but help with set up  Says needs help with changing ostomy bag    [facility] urology used to have standing order for UA. Asked about this. Discussed that if she ever felt unwell, she should call the office for an appointment and consideration of a UA. She urinates to a bag now and also never gets pain around the site when she has a UTI. Says usually feels unwell and sick. Says urine has been foul smelling but feels well so she does not feel she has a UTI at present. Feels does not usually have fevers w/ UTIs.     Patient's medications, allergies, past medical, surgical, social and family histories were reviewed and updated as appropriate.    Outpatient Medications Marked  as Taking for the 02/27/21 encounter (Office Visit) with Ezequiel Ganser, MD   Medication Sig Dispense Refill    menthol-zinc oxide (CALMOSEPTINE) 0.44-20.6 % ointment Apply 3 times daily to the right buttock wound as directed 113 g 6    etonogestrel (NEXPLANON) 68 MG IMPL Inject 68 mg into the skin once   Placed 10/20/16         Allergies:     Allergies   Allergen Reactions    Heparin Other (See Comments)     Thrombocytopenia; HIT    Ibuprofen Swelling     Pt reports lip swelling after taking ibuprofen.     Nitrofurantoin Nausea And Vomiting       Review of Systems:     As per HPI. Otherwise, 10 point ROS unremarkable.    Physical Exam:     Vitals:    02/27/21 1355   BP: 112/62   Pulse: 101   Temp: 36.1 C (97 F)     Vitals:    02/27/21 1355   BP: 112/62   Pulse: 101   Temp: 36.1 C (97 F)   SpO2: 98%     Wt Readings from Last 3 Encounters:   01/17/21 61.2 kg (135 lb)   12/04/20 64.9 kg (143 lb)   10/03/20 64.9 kg (  143 lb)     BP Readings from Last 3 Encounters:   02/27/21 112/62   01/17/21 (!) 179/99   12/04/20 138/88       Physical Exam:    General: in no acute distress, wheelchair bound  HEENT: Sclerae anicteric.   Lungs: CTA b/l without wheezes or crackles anteriorly  Heart: rrr, no murmurs appreciated   Abdomen: Non-distended. Normoactive bowel sounds. Soft, non-tender.  Extremities: No bilateral lower extremity edema.    Assessment and Plan:     Cindy Ochoa is a 35 y.o. female, with a PMHxof autonomic dysfunction, constipation, sacral decub, quadriplegic C6, respiratory failure,hx oftrach, frequent UTIs, presents today forfollow up.    Shalyn was seen today for follow-up.    Diagnoses and all orders for this visit:    History of tracheostomy  Will refer patient back to ENT to discuss whether she should have a surgery given open trach site.   -     AMB REFERRAL TO OTOLARYNGOLOGY    Respiratory insufficiency  Discussed she should get CXR today to see about any improvements for her lung given prior finding of  atelectasis.     History of recurrent UTIs  Discussed that she should call the office if she is unwell for office visit for consideration of UA. Discussed why we typically do not do standing orders for UA. Discussed that she can also consider establishing care again with Urology.    Constipation, chronic  Can continue with enemas every other day if this helps her more with her constipation    Health Maintenance:  - Pap Smear - has upcoming appointment per patient  - Immunizations - UTD    [facility] Smoking Cessation Plan    Discussed smoking cessation with patient. Patient readiness to quit: Yes  Discussed/counseled with patient smoking cessation plan according to USPSTF guidelines: Commended for quitting. Reviewed interventions to prevent relapses.    Follow up in 3 months, or sooner if needed.    Ezequiel Ganser, MD  PGY-3 Internal Medicine  02/27/2021 2:03 PM

## 2021-02-27 NOTE — Progress Notes (Signed)
CM met with patient and Dr. Sander Nephew today at [facility] for patients appointment. CM introduced himself as patients HHCM, and attended patients appointment. Patients pcp reviewed and checked medications as well as completing a physical. Patient stated she was having difficulty with obtaining paperwork required for her GED program. Patients PCP followed up with this earlier this week and submitted more detailed accommodations required for patient. Patient stated she did not have any other concerns for CM at this time.

## 2021-02-27 NOTE — Patient Instructions (Signed)
Please get x-ray today    I am making a referral to ENT to have a better plan for your trach site

## 2021-03-03 ENCOUNTER — Encounter: Payer: Self-pay | Admitting: Student in an Organized Health Care Education/Training Program

## 2021-03-06 ENCOUNTER — Encounter: Payer: Self-pay | Admitting: Gastroenterology

## 2021-03-06 ENCOUNTER — Ambulatory Visit: Payer: Medicaid Other | Admitting: Orthopedic Surgery

## 2021-03-06 NOTE — Progress Notes (Deleted)
{bgpart2:35073} Surgery Follow-up Visit:    Cindy Ochoa is a 35 y.o. 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     Last seen:     Diagnosis:       Surgery date:       Medication management: {bgmed1:33515}    Conservative treatment: {bgconserv:33513}      There were no vitals filed for this visit.     Answers for HPI/ROS submitted by the patient on 02/28/2021  Fever: No  Chills: No  Weight loss: No  Malaise/fatigue: No  Rash: No  Dry skin: No  Itching: No  Wound : No  Headache: No  Hearing loss: No  Sore throat: No  Dental problem: No  Blindness: No  Visual disturbance: No  Chest pain: No  Palpitations: No  Leg cramps with exercise: No  Leg swelling: No  Fainting: No  Heartburn: No  Nausea: No  Vomiting: No  Diarrhea: No  Constipation: No  Blood in stool: No  Cough : No  Apnea: No  Shortness of breath: No  Frequency: No  Urgency: No  Blood in urine: No  Incomplete emptying: No  Falls: No  Neck pain: No  Back pain: No  Joint swelling: No  Muscle spasms: No  Muscle cramps: No  Easy to bruise/bleed: No  Frequent infections: No  Numbness: No  Tingling: No  Seizures: No  Loss of consciousness: No  Weakness: No  Depression: No  Nervous/anxious: No  Memory loss: No        Current Outpatient Medications on File Prior to Visit   Medication Sig Dispense Refill    melatonin 3 mg Take 1 tablet (3 mg total) by mouth nightly as needed for Sleep 30 tablet 0    acetaminophen (MAPAP) 500 mg tablet Take 1 tablet (500 mg total) by mouth every 4-6 hours as needed  for pain 180 tablet 5    Non-System Medication Chux pads  Ht: 5\' 5"  Wt: 143lbs  ICD code: N39.41 300 each 5    Non-System Medication Briefs/pullups, large  Ht: 5\' 5"  Wt: 143lbs  ICD code: N39.41 250 each 5    polyethylene glycol (GLYCOLAX) 17 gm/scoop powder Take one capful (17 g), see line in cap, by mouth daily. Mix in 8 oz water or juice and drink. (Patient not taking: Reported on 10/30/2020)  510 g 5    bisacodyl (BISAC-EVAC) 10 mg suppository Place 1 suppository (10 mg total) rectally daily as needed 50 suppository 3    guaiFENesin (MUCINEX) 600 MG 12 hr tablet Take 1 tablet (600 mg total) by mouth 2 times daily  Swallow whole. Do not crush, break, or chew. (Patient not taking: Reported on 10/30/2020) 60 tablet 5    clindamycin (CLEOCIN T) 1 % lotion Apply to affected skin on neck and under arms 1-2 times daily. (Patient not taking: Reported on 02/27/2021) 60 mL 5    Non-System Medication Motorized wheelchair-   Lifetime 99  Quad c-6 1 each 0    Non-System Medication Nebulizer with tubing and mask  RO 689  Respiratory insufficiency 1 each 0    Non-System Medication Portable suction  respiratory insufficiency  Tracheotomy   Quadriparesis 1 each 0    sodium chloride 0.9 % nebulizer solution Inhale 3 mLs by nebulization 4 times daily as needed for Wheezing (Patient not taking: Reported on 10/30/2020) 90 mL 5    ipratropium-albuterol (DUONEB) 0.5-2.5mg  /16mL  nebulizer solution Inhale 3 mLs by nebulization 4 times daily as needed for Wheezing or Shortness of Breath (Patient not taking: Reported on 10/30/2020) 90 mL 5    Gauze Pads & Dressings (POLYMEM TRACH DRESSING) 3-1/2"X3-1/2" PADS Use as needed 1 each 0    generic DME Suction machine with all related supplies. 1 each 0    menthol-zinc oxide (CALMOSEPTINE) 0.44-20.6 % ointment Apply 3 times daily to the right buttock wound as directed 113 g 6    Non-System Medication Large underpads  Ht: 5\' 5"  Wt: 143lbs  ICD code: N39.41 150 each 5    generic DME 1/2 inch plain packing gauze 30 each 0    generic DME Dispense: ROHO  Indication: Quadriplegia   ICD-10: R53.2  Duration: Lifetime  Ht Readings from Last 1 Encounters:  03/19/20 : 1.626 m (5\' 4" )   Wt Readings from Last 1 Encounters:  03/19/20 : 63.5 kg (140 lb) 1 each 0    hydrocortisone (ANUSOL-HC) 25 MG suppository Place 1 suppository rectally  every other night (Patient not taking: Reported on  02/27/2021) 12 suppository 2    Non-System Medication Gloves  Ht: 5\' 5"  Wt: 143lbs  ICD code: N39.41 1 each 5    disposable underpads 30"x36" (CHUX) Use 6 times daily and PRN. Dx N39.42  Incontinence without sensory awareness 300 each 99    incontinence supply disposable Large pull ups - use up to 5 x daily  Dx N39.46 150 each 5    disposable gloves 1 box Dynarex PF Vinyl Gloves 100 each 5    patient lift Use as directed for patient lifting. For lifetime use; ICD 10: G82.54 Ht: 1.33m Wt: 76.2 kg 1 Device 0    adjustable bath/shower seat with back For lifetime use; ICD 10: G82.54 Ht: 1.66m Wt: 76.2 kg 1 each 0    Non-System Medication Urostomy drainage bags 2000 cc change as needed. Dx N39.46 and  G82.54 4 each 99    generic DME Wafers  Dx code: N31.9  Qty: 10  Refills: 6 1 each 0    generic DME Urostomy Pouches  Dx Codes(s): N31.9  Qty: 20  Refills: 6 1 each 0    generic DME Leg bag  Dx Code: N31.9  Qty: 4  Refills: 6 1 each 0    generic DME Night drainage bag  Dx Code: N31.9  Qty: 4  Refills: 6 1 each 0    generic DME Barrier rings  Dx Code: N31.9  Qty: 15  Refills: 6 1 each 0    Ostomy Supplies MISC Coloplast Urostomy two piece bag and wafer 1 1/4 "H  7/8 "V N31.9 Neuromuscular dysfunction of the bladder 14 each 11    Ostomy Supplies [facility] Coloplast Urostomy two piece bag and wafer 1 1/4 "H  7/8 "V N31.9 Neuromuscular dysfunction of the bladder. Use as directed. 15 each 99    Ostomy Supplies Pouch MISC Coloplast Urostomy two piece bag and wafer 1 1/4 "H  7/8 "V N31.9 Neuromuscular dysfunction of the bladder Use as directed 15 each 11    Ostomy Supplies MISC Barrier ring. Use as directed. 15 each 11    generic DME Repairs to hospital bed/hoyer lift  ICD 10: G82.54   Ht: 1.32m Wt: 76.2 kg 1 each 0    disposable gloves 2 boxes Disposable Medium size gloves N39.46 200 each 99    generic DME Urostomy drainage bags 2000 cc change as needed. Dx N39.46 and  G82.54 4 each  99    Gauze Pads & Dressings  (ABDOMINAL PAD) 8"X10" PADS By 1 each no specified route daily   Cover buttock wounds 2x daily 60 each 6    Adhesive Tape (MEDIPORE SURGICAL 2"X10YD) TAPE Secure the 2 buttocks  wound dressings 2x day (Patient not taking: Reported on 10/16/2020) 8 each 6    Wound Dressings (HYDROFERA BLUE 4"X4") PADS Cut and moisten with saline as directed and pack in to buttock wound daily 12 each 6    Non-System Medication Gel overlay mattress for hospital bed - diagnosis G82.54, L89.93, L89.159 1 each 0    Non-System Medication Easy Tip Leg Bags 1000mg  - diagnosis G82.53 N39.46 21 each 4    etonogestrel (NEXPLANON) 68 MG IMPL Inject 68 mg into the skin once   Placed 10/20/16       No current facility-administered medications on file prior to visit.       There were no vitals filed for this visit.    Physical examination:  She appears to be well and in no acute distress.  She ambulates with {bggait1:33516} gait {bggait2:33522}.  She is neurologically intact with respect to motor, sensory and reflex to the {bguplow1:35074} extremities bilaterally.        Today's imaging: ***        Treatment plan:         Follow up:       Any change or worsening symptoms she will contact our office. I have answered all her questions to her satisfaction and she is agreeable to the treatment plan.

## 2021-03-07 ENCOUNTER — Encounter: Payer: Self-pay | Admitting: Physical Medicine and Rehabilitation

## 2021-03-07 ENCOUNTER — Ambulatory Visit
Payer: Medicaid Other | Attending: Physical Medicine and Rehabilitation | Admitting: Physical Medicine and Rehabilitation

## 2021-03-07 VITALS — BP 150/96 | HR 95 | Temp 96.8°F | Ht 65.0 in | Wt 135.0 lb

## 2021-03-07 DIAGNOSIS — M62838 Other muscle spasm: Secondary | ICD-10-CM | POA: Insufficient documentation

## 2021-03-07 MED ORDER — ONABOTULINUMTOXINA 100 UNIT IJ SOLR *I*
100.0000 [IU] | Freq: Once | INTRAMUSCULAR | Status: AC | PRN
Start: 2021-03-07 — End: 2021-03-07
  Administered 2021-03-07: 100 [IU] via INTRAMUSCULAR

## 2021-03-07 NOTE — Procedures (Addendum)
PMR Botox Injections  Performed by: Alena Bills, MD  Authorized by: Alena Bills, MD     Date/time: 03/07/2021  2:00 PM EST  Injections:  68372 - Chemodenervation 1 ext 1-4 muscles and 64643 - Chemodenervation 1 ext 1-4 muscles, 1st addtl  Medication:  100 units botulinum toxin type A 100 units; 100 units botulinum toxin type A 100 units; 100 units botulinum toxin type A 100 units; 100 units botulinum toxin type A 100 units; 100 units botulinum toxin type A 100 units; 100 units botulinum toxin type A 100 units   See below        Procedure Details   The risks, benefits, indications, potential complications, and alternatives were explained to the patient and/or guardian who verbalized understanding and informed consent obtained.     The area for injection was identified and a time out called to re-identify.      The correct patient was identified with 2 identifiers The correct procedure, location site(s) and laterality were identified with the patient and/or guardian and correspond to the consent form  The appropriate site was marked and verified.  The patient was placed in the correct position.    After prepping the skin with alcohol overlying the following muscles, botlinum toxin was injected intramuscularly as follows.     Right and left quadriceps (vastus lateralis 75, vastus medialis 75, vastus intermedius 75, rectus femoris 75) 300 + 300 units      Botulinum toxin Lot #: [id number]  Botulinum toxin expiration date: 03/2023  Total botox units injected: 600 units   Total botox units wasted: 0 units     The patient tolerated the procedure without complications.    Patient will follow up in 12 weeks for repeat botox injection     The above procedure was performed by Dr. Morton Peters, MD, [place].  I was present supervising the entire procedure.    Alena Bills, MD

## 2021-03-12 ENCOUNTER — Telehealth: Payer: Self-pay | Admitting: Internal Medicine

## 2021-03-12 NOTE — Telephone Encounter (Signed)
Copied from [place] #[id number]. Topic: Access to Care - Speak to Provider/Office Staff  >> Mar 12, 2021  4:00 PM Kaylyn Layer, Legrand Rams wrote:  [place]    They received the [id number] however a resident had signed it. They need it signed by an attending doctor in order for it to be accepted. Patient was seen 03.03.2022 they need it within 30 days to be valid.     [phone]    Call back: [phone]

## 2021-03-13 NOTE — Telephone Encounter (Signed)
Writer called [employer] informing them that [place] form was signed by PCP and CCP at the firs place before it was faxed to the insurance ,Probation officer also informe them that [facility] form was re faxed agin on 03/12/21    Representative verbalized understanding

## 2021-03-14 ENCOUNTER — Telehealth: Payer: Self-pay | Admitting: Otolaryngology

## 2021-03-14 NOTE — Telephone Encounter (Signed)
04.18.2022 at 830am with [facility]     Referral to ENT due to tracheostomy stoma     Per Evelina Bucy, okay to fit in on "fit in day" in April or May    Held appt and sending message

## 2021-03-17 ENCOUNTER — Ambulatory Visit
Admission: RE | Admit: 2021-03-17 | Discharge: 2021-03-17 | Disposition: A | Discharge status: Home / Self Care | Payer: Medicaid Other | Source: Ambulatory Visit

## 2021-03-17 ENCOUNTER — Ambulatory Visit
Admission: RE | Admit: 2021-03-17 | Discharge: 2021-03-17 | Disposition: A | Discharge status: Home / Self Care | Payer: Medicaid Other | Source: Ambulatory Visit | Attending: Pulmonology | Admitting: Pulmonology

## 2021-03-17 DIAGNOSIS — G8254 Quadriplegia, C5-C7 incomplete: Secondary | ICD-10-CM

## 2021-03-17 DIAGNOSIS — J690 Pneumonitis due to inhalation of food and vomit: Secondary | ICD-10-CM | POA: Insufficient documentation

## 2021-03-17 DIAGNOSIS — M542 Cervicalgia: Secondary | ICD-10-CM

## 2021-03-17 DIAGNOSIS — Z981 Arthrodesis status: Secondary | ICD-10-CM

## 2021-03-18 ENCOUNTER — Ambulatory Visit: Payer: Medicaid Other | Admitting: Ophthalmology

## 2021-03-19 ENCOUNTER — Telehealth: Payer: Self-pay | Admitting: Otolaryngology

## 2021-03-19 ENCOUNTER — Encounter: Payer: Self-pay | Admitting: Student in an Organized Health Care Education/Training Program

## 2021-03-19 NOTE — Telephone Encounter (Signed)
Patient called in regards to her appointment on 04/18 with Dr. Evelina Bucy. She states that a morning appointment does not work out well with her and would like to know if it is possible to be seen sometime in the afternoon. There is a referral on file. Please advise if later date/time is okay for this patient to be seen with her dx, thank you.

## 2021-03-19 NOTE — Telephone Encounter (Signed)
Called the patient and offered her 05/02 at 2pm.

## 2021-03-21 ENCOUNTER — Ambulatory Visit: Payer: Medicaid Other

## 2021-04-07 ENCOUNTER — Other Ambulatory Visit: Payer: Self-pay

## 2021-04-07 NOTE — Progress Notes (Signed)
CM reached out to patient today to see if she would like to reschedule her appointment she missed at [facility]. Patient stated she was going to finish some personal things first before rescheduling. CM also reviewed and reminded patient of her upcoming appointments. Patient was in agreement with upcoming appointments reviewed by CM. Patient stated she did not have any other concerns at the moment.

## 2021-04-08 ENCOUNTER — Other Ambulatory Visit: Payer: Self-pay

## 2021-04-08 NOTE — Progress Notes (Signed)
Patient reached out to [facility] requesting assistance to get an appointment scheduled at [facility] for her [facility] paperwork. CM scheduled appointment for patient and set up transportation. CM then updated patient.     Cindy Ochoa is scheduled for a Primary Care Office appointment on 04/16/2021 at 9:00am.  At [place] will pick up [facility] at [place] and take them to [place], [place]  At 10:30am [place] will pick up [employer] at [place] and take them to [place], [place] may contact [place] at [phone] for changes to pick-up and return times prior to this trip.

## 2021-04-14 ENCOUNTER — Ambulatory Visit: Payer: Medicaid Other | Admitting: Otolaryngology

## 2021-04-15 ENCOUNTER — Encounter: Payer: Self-pay | Admitting: Internal Medicine

## 2021-04-15 ENCOUNTER — Ambulatory Visit: Payer: Medicaid Other | Admitting: Internal Medicine

## 2021-04-16 ENCOUNTER — Other Ambulatory Visit
Admission: RE | Admit: 2021-04-16 | Discharge: 2021-04-16 | Disposition: A | Discharge status: Home / Self Care | Payer: Medicaid Other | Source: Ambulatory Visit | Attending: Orthopedic Surgery | Admitting: Orthopedic Surgery

## 2021-04-16 ENCOUNTER — Encounter: Payer: Self-pay | Admitting: Internal Medicine

## 2021-04-16 ENCOUNTER — Ambulatory Visit: Payer: Medicaid Other | Admitting: Internal Medicine

## 2021-04-16 ENCOUNTER — Other Ambulatory Visit: Payer: Self-pay

## 2021-04-16 VITALS — BP 122/72 | HR 100 | Temp 96.6°F | Ht 64.0 in | Wt 135.0 lb

## 2021-04-16 DIAGNOSIS — K592 Neurogenic bowel, not elsewhere classified: Secondary | ICD-10-CM

## 2021-04-16 DIAGNOSIS — M810 Age-related osteoporosis without current pathological fracture: Secondary | ICD-10-CM | POA: Insufficient documentation

## 2021-04-16 DIAGNOSIS — L0291 Cutaneous abscess, unspecified: Secondary | ICD-10-CM | POA: Insufficient documentation

## 2021-04-16 DIAGNOSIS — Z993 Dependence on wheelchair: Secondary | ICD-10-CM

## 2021-04-16 DIAGNOSIS — K5909 Other constipation: Secondary | ICD-10-CM

## 2021-04-16 LAB — CBC AND DIFFERENTIAL
Baso # K/uL: 0 10*3/uL (ref 0.0–0.1)
Basophil %: 0.1 %
Eos # K/uL: 0.1 10*3/uL (ref 0.0–0.4)
Eosinophil %: 0.7 %
Hematocrit: 42 % (ref 34–45)
Hemoglobin: 13.1 g/dL (ref 11.2–15.7)
IMM Granulocytes #: 0 10*3/uL (ref 0.0–0.0)
IMM Granulocytes: 0.3 %
Lymph # K/uL: 2.9 10*3/uL (ref 1.2–3.7)
Lymphocyte %: 39.5 %
MCH: 30 pg (ref 26–32)
MCHC: 31 g/dL — ABNORMAL LOW (ref 32–36)
MCV: 96 fL — ABNORMAL HIGH (ref 79–95)
Mono # K/uL: 0.4 10*3/uL (ref 0.2–0.9)
Monocyte %: 5.9 %
Neut # K/uL: 4 10*3/uL (ref 1.6–6.1)
Nucl RBC # K/uL: 0 10*3/uL (ref 0.0–0.0)
Nucl RBC %: 0 /100 WBC (ref 0.0–0.2)
Platelets: 115 10*3/uL — ABNORMAL LOW (ref 160–370)
RBC: 4.4 MIL/uL (ref 3.9–5.2)
RDW: 13.9 % (ref 11.7–14.4)
Seg Neut %: 53.5 %
WBC: 7.4 10*3/uL (ref 4.0–10.0)

## 2021-04-16 LAB — CALCIUM: Calcium: 9.7 mg/dL (ref 8.8–10.2)

## 2021-04-16 LAB — CREATININE, SERUM
Creatinine: 0.42 mg/dL — ABNORMAL LOW (ref 0.51–0.95)
eGFR BY CREAT: 130 *

## 2021-04-16 LAB — VITAMIN D: 25-OH Vit Total: 11 ng/mL — ABNORMAL LOW (ref 30–60)

## 2021-04-16 MED ORDER — CEPHALEXIN 500 MG PO CAPS *I*
500.0000 mg | ORAL_CAPSULE | Freq: Three times a day (TID) | ORAL | 0 refills | Status: DC
  Filled 2021-04-16: qty 30, 10d supply, fill #0

## 2021-04-16 MED ORDER — DOCUSATE SODIUM 100 MG PO CAPS *I*
100.0000 mg | ORAL_CAPSULE | Freq: Two times a day (BID) | ORAL | 11 refills | Status: DC
Start: 2021-04-16 — End: 2021-06-18
  Filled 2021-04-16: qty 60, 30d supply, fill #0

## 2021-04-16 NOTE — Progress Notes (Addendum)
Reason For Visit: The primary encounter diagnosis was Abscess. Diagnoses of Constipation, chronic, Wheelchair bound, and Neurogenic bowel were also pertinent to this visit.      HPI:  Cindy Ochoa is 35 y.o. 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   She is able to operate her motorized wheelchair  Currently no decubi    She needs help with cooking, cleaning, laundry, medications and grocery shopping       Suprapubic cath/ UTI and kidney stones   She has not seen urology in about 2 years    Constipation- using colace, miralax and suppositories     Lump back of her neck  It has been there for 2 days  No drainage  She had abscess in this area in the past that needed to be drained   She has no fever or chills     Medications:     Current Outpatient Medications   Medication Sig    cephalexin (KEFLEX) 500 mg capsule Take 1 capsule (500 mg total) by mouth every 8 hours  for Infection of the Skin and/or Skin Structures    docusate sodium (COLACE) 100 mg capsule Take 1 capsule (100 mg total) by mouth 2 times daily    melatonin 3 mg Take 1 tablet (3 mg total) by mouth nightly as needed for Sleep    acetaminophen (MAPAP) 500 mg tablet Take 1 tablet (500 mg total) by mouth every 4-6 hours as needed  for pain    Non-System Medication Chux pads  Ht: 5\' 5"  Wt: 143lbs  ICD code: N39.41    Non-System Medication Briefs/pullups, large  Ht: 5\' 5"  Wt: 143lbs  ICD code: N39.41    polyethylene glycol (GLYCOLAX) 17 gm/scoop powder Take one capful (17 g), see line in cap, by mouth daily. Mix in 8 oz water or juice and drink.    bisacodyl (BISAC-EVAC) 10 mg suppository Place 1 suppository (10 mg total) rectally daily as needed    Non-System Medication Motorized  wheelchair-   Lifetime 99  Quad c-6    Non-System Medication Nebulizer with tubing and mask  RO 689  Respiratory insufficiency    Non-System Medication Portable suction  respiratory insufficiency  Tracheotomy   Quadriparesis    sodium chloride 0.9 % nebulizer solution Inhale 3 mLs by nebulization 4 times daily as needed for Wheezing    ipratropium-albuterol (DUONEB) 0.5-2.5mg  /44mL nebulizer solution Inhale 3 mLs by nebulization 4 times daily as needed for Wheezing or Shortness of Breath    Gauze Pads & Dressings (POLYMEM TRACH DRESSING) 3-1/2"X3-1/2" PADS Use as needed    generic DME Suction machine with all related supplies.    menthol-zinc oxide (CALMOSEPTINE) 0.44-20.6 % ointment Apply 3 times daily to the right buttock wound as directed    Non-System Medication Large underpads  Ht: 5\' 5"  Wt: 143lbs  ICD code: N39.41    generic DME 1/2 inch plain packing gauze    generic DME Dispense: ROHO  Indication: Quadriplegia   ICD-10: R53.2  Duration: Lifetime  Ht Readings from Last 1 Encounters:  03/19/20 : 1.626 m (5\' 4" )   Wt Readings from Last 1 Encounters:  03/19/20 : 63.5 kg (140 lb)  hydrocortisone (ANUSOL-HC) 25 MG suppository Place 1 suppository rectally  every other night    Non-System Medication Gloves  Ht: 5\' 5"  Wt: 143lbs  ICD code: N39.41    disposable underpads 30"x36" (CHUX) Use 6 times daily and PRN. Dx N39.42  Incontinence without sensory awareness    incontinence supply disposable Large pull ups - use up to 5 x daily  Dx N39.46    disposable gloves 1 box Dynarex PF Vinyl Gloves    patient lift Use as directed for patient lifting. For lifetime use; ICD 10: G82.54 Ht: 1.3m Wt: 76.2 kg    adjustable bath/shower seat with back For lifetime use; ICD 10: G82.54 Ht: 1.68m Wt: 76.2 kg    Non-System Medication Urostomy drainage bags 2000 cc change as needed. Dx N39.46 and  G82.54    generic DME Wafers  Dx code: N31.9  Qty: 10  Refills: 6    generic DME Urostomy Pouches  Dx Codes(s):  N31.9  Qty: 20  Refills: 6    generic DME Leg bag  Dx Code: N31.9  Qty: 4  Refills: 6    generic DME Night drainage bag  Dx Code: N31.9  Qty: 4  Refills: 6    generic DME Barrier rings  Dx Code: N31.9  Qty: 15  Refills: 6    Ostomy Supplies [employer] Urostomy two piece bag and wafer 1 1/4 "H  7/8 "V N31.9 Neuromuscular dysfunction of the bladder    Ostomy Supplies [facility] [employer] Urostomy two piece bag and wafer 1 1/4 "H  7/8 "V N31.9 Neuromuscular dysfunction of the bladder. Use as directed.    Ostomy Supplies Pouch [employer] Urostomy two piece bag and wafer 1 1/4 "H  7/8 "V N31.9 Neuromuscular dysfunction of the bladder Use as directed    Ostomy Supplies MISC Barrier ring. Use as directed.    generic DME Repairs to hospital bed/hoyer lift  ICD 10: G82.54   Ht: 1.90m Wt: 76.2 kg    disposable gloves 2 boxes Disposable Medium size gloves N39.46    generic DME Urostomy drainage bags 2000 cc change as needed. Dx N39.46 and  G82.54    Gauze Pads & Dressings (ABDOMINAL PAD) 8"X10" PADS By 1 each no specified route daily   Cover buttock wounds 2x daily    Adhesive Tape (MEDIPORE SURGICAL 2"X10YD) TAPE Secure the 2 buttocks  wound dressings 2x day (Patient not taking: Reported on 10/16/2020)    Wound Dressings (HYDROFERA BLUE 4"X4") PADS Cut and moisten with saline as directed and pack in to buttock wound daily    Non-System Medication Gel overlay mattress for hospital bed - diagnosis G82.54, L89.93, L89.159    Non-System Medication Easy Tip Leg Bags 1000mg  - diagnosis G82.53 N39.46    etonogestrel (NEXPLANON) 68 MG IMPL Inject 68 mg into the skin once   Placed 10/20/16     No current facility-administered medications for this visit.       Medication list reconciled this visit    Allergies:     Allergies   Allergen Reactions    Heparin Other (See Comments)     Thrombocytopenia; HIT    Ibuprofen Swelling     Pt reports lip swelling after taking ibuprofen.     Nitrofurantoin Nausea And  Vomiting       Social history      Social History     Tobacco Use    Smoking status: Former Smoker     Packs/day: 0.25  Years: 3.00     Pack years: 0.75     Types: Cigarettes     Start date: 12/09/2011     Quit date: 07/30/2016     Years since quitting: 4.7    Smokeless tobacco: Former Systems developer     Quit date: 04/29/2005    Tobacco comment: quit 07/2019   Substance Use Topics    Alcohol use: Yes       Review of Systems     10 point review of system is negative unless discussed in subjective     Physical Exam:     Physical Exam  Vitals reviewed.   Constitutional:       Comments: In motorized wheelchair    Neck:     Cardiovascular:      Rate and Rhythm: Normal rate and regular rhythm.      Pulses: Normal pulses.      Heart sounds: Normal heart sounds.   Pulmonary:      Effort: Pulmonary effort is normal.   Abdominal:      General: Abdomen is flat.      Comments: Suprapubic cath             Vitals:    04/16/21 0932   BP: 122/72   BP Location: Left arm   Patient Position: Sitting   Cuff Size: adult   Pulse: 100   Temp: 35.9 C (96.6 F)   TempSrc: Temporal   SpO2: 98%   Weight: 61.2 kg (135 lb)   Height: 1.626 m (5\' 4" )     Wt Readings from Last 3 Encounters:   04/16/21 61.2 kg (135 lb)   03/07/21 61.2 kg (135 lb)   01/17/21 61.2 kg (135 lb)     BP Readings from Last 3 Encounters:   04/16/21 122/72   03/07/21 (!) 150/96   02/27/21 112/62           RESULTS:     Reviewed all pertain lab and xray reports during the visit     ASSESSMENT/PLAN:     Diagnoses and all orders for this visit:    Abscess vs enlarged lymph node   -     CBC and differential; Future     cephalexin (KEFLEX) 500 mg capsule; Take 1 capsule (500 mg total) by mouth every 8 hours  for Infection of the Skin and/or Skin Structures  Ultrasound of soft tissue to evaluate the area    Constipation, chronic- continue colace, miralax sup    Wheelchair bound- needs aid services  c-dpas completed and given to the secretary      Neurogenic bowel- should be followed by  urology    (100 mg total) by mouth 2 times daily          I personally spent 35 minutes  on the calendar day of the encounter, including pre and post visit work.      Patient instructions      Patient Instructions   [facility]   [phone]    Go to the lab today to get your white count checked          Signed by Sherlon Handing, NP [facility]4/20/20229:49 AM

## 2021-04-16 NOTE — Patient Instructions (Addendum)
[facility]   [phone]    Go to the lab today to get your white count checked

## 2021-04-16 NOTE — Addendum Note (Signed)
Addended by: Lavone Orn on: 04/16/2021 03:53 PM     Modules accepted: Orders

## 2021-04-18 ENCOUNTER — Telehealth: Payer: Self-pay | Admitting: Student in an Organized Health Care Education/Training Program

## 2021-04-18 LAB — N-TELOPEPTIDE, URINE
Creat,Ur: 86 mg/dL
NTX Telopeptide: 21

## 2021-04-18 NOTE — Telephone Encounter (Signed)
Call to [facility] to give appointment scheduled for a US soft tissue neck and head of the Dr.Qu as requested at last office visit.     Cindy Ochoa is scheduled on May 13 at 2:00         This test is located at   [place]    Should patient need to reschedule, they can call [phone]    Call Outcome: Pt verbalized understanding

## 2021-04-21 ENCOUNTER — Telehealth: Payer: Self-pay | Admitting: Internal Medicine

## 2021-04-21 ENCOUNTER — Encounter: Payer: Self-pay | Admitting: Gastroenterology

## 2021-04-21 NOTE — Telephone Encounter (Signed)
Copied from [place] #[id number]. Topic: Access to Care - Speak to Provider/Office Staff  >> Apr 21, 2021  2:20 PM Brendia Sacks wrote:  [place] [id number] for aid services has been approved.    A Nurse willl do an assessment.    Burman Nieves can be reached at [phone], if needed.    FYI

## 2021-04-21 NOTE — Telephone Encounter (Signed)
Message noted.

## 2021-04-22 ENCOUNTER — Other Ambulatory Visit: Payer: Self-pay

## 2021-04-22 ENCOUNTER — Encounter: Payer: Self-pay | Admitting: Gastroenterology

## 2021-04-22 NOTE — Progress Notes (Signed)
Cindy Ochoa is scheduled for a Other Specialist appointment on 04/28/2021 at 2:00pm.  At 1:00pm Cindy Ochoa will pick up Cindy Ochoa, Cindy Ochoa at [place] and take them to [address]  At 3:30pm Cindy Ochoa will pick up Cindy Ochoa, Cindy Ochoa at [address] and take them to [place], Cindy Ochoa may contact [place] at [phone] for changes to pick-up and return times prior to this trip.  Cindy Ochoa's Trip  Date: 04/28/2021 Time: 2:00pm Inv# [phone]

## 2021-04-28 ENCOUNTER — Ambulatory Visit: Payer: Medicaid Other | Admitting: Otolaryngology

## 2021-05-05 ENCOUNTER — Other Ambulatory Visit: Payer: Self-pay

## 2021-05-05 NOTE — Progress Notes (Signed)
CM reached out to patient today because patient missed her ENT appointment last week. CM called and rescheduled this appointment for patient. CM then updated patient on new appointment time. CM also reminded/reviewed patients upcoming appointments for this month. Patient was made aware and stated she did not need transportation. Patient had no other concerns at this time.

## 2021-05-09 ENCOUNTER — Ambulatory Visit
Admission: RE | Admit: 2021-05-09 | Discharge: 2021-05-09 | Disposition: A | Discharge status: Home / Self Care | Payer: Medicaid Other | Source: Ambulatory Visit | Attending: Internal Medicine | Admitting: Internal Medicine

## 2021-05-09 DIAGNOSIS — L0211 Cutaneous abscess of neck: Secondary | ICD-10-CM | POA: Insufficient documentation

## 2021-05-09 DIAGNOSIS — L0291 Cutaneous abscess, unspecified: Secondary | ICD-10-CM | POA: Insufficient documentation

## 2021-05-12 ENCOUNTER — Telehealth: Payer: Self-pay | Admitting: Internal Medicine

## 2021-05-12 DIAGNOSIS — R221 Localized swelling, mass and lump, neck: Secondary | ICD-10-CM

## 2021-05-12 NOTE — Telephone Encounter (Signed)
Called patient to discuss mass on her neck  Never got better with antibiotic    In the patient's area of clinical concern along the left posterior neck there is a subcutaneous 11 x 4 x 13 mm hypoechoic region with peripheral vascularity. This has a nonspecific ultrasound appearance but is larger than a similar lesion noted     Referral to plastic surgery     [place], NP

## 2021-05-21 ENCOUNTER — Other Ambulatory Visit: Payer: Self-pay | Admitting: Student in an Organized Health Care Education/Training Program

## 2021-05-21 DIAGNOSIS — N3941 Urge incontinence: Secondary | ICD-10-CM

## 2021-05-21 MED ORDER — GENERIC DME *A*
5 refills | Status: DC
Start: 2021-05-21 — End: 2022-06-03

## 2021-05-29 ENCOUNTER — Encounter: Payer: Self-pay | Admitting: Obstetrics and Gynecology

## 2021-05-29 ENCOUNTER — Ambulatory Visit: Payer: Medicaid Other | Attending: Obstetrics and Gynecology | Admitting: Obstetrics and Gynecology

## 2021-05-29 VITALS — BP 93/54 | HR 94 | Ht 64.0 in | Wt 140.0 lb

## 2021-05-29 DIAGNOSIS — Z01419 Encounter for gynecological examination (general) (routine) without abnormal findings: Secondary | ICD-10-CM | POA: Insufficient documentation

## 2021-05-29 DIAGNOSIS — Z124 Encounter for screening for malignant neoplasm of cervix: Secondary | ICD-10-CM | POA: Insufficient documentation

## 2021-05-29 DIAGNOSIS — Z113 Encounter for screening for infections with a predominantly sexual mode of transmission: Secondary | ICD-10-CM | POA: Insufficient documentation

## 2021-05-29 DIAGNOSIS — Z975 Presence of (intrauterine) contraceptive device: Secondary | ICD-10-CM | POA: Insufficient documentation

## 2021-05-29 DIAGNOSIS — N9489 Other specified conditions associated with female genital organs and menstrual cycle: Secondary | ICD-10-CM | POA: Insufficient documentation
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
## 2021-05-29 NOTE — Patient Instructions (Signed)
Patient Education   Weight Management   AMBULATORY CARE:   Why it is important to manage your weight:  Being overweight increases your risk of health conditions such as heart disease, high blood pressure, type 2 diabetes, and certain types of cancer. It can also increase your risk for osteoarthritis, sleep apnea, and other respiratory problems. Aim for a slow, steady weight loss. Even a small amount of weight loss can lower your risk of health problems.  How to lose weight safely:  A safe and healthy way to lose weight is to eat fewer calories and get regular exercise.   You can lose up about 1 pound a week by decreasing the number of calories you eat by 500 calories each day. You can decrease calories by eating smaller portion sizes or by cutting out high-calorie foods. Read labels to find out how many calories are in the foods you eat.          You can also burn calories with exercise such as walking, swimming, or biking. You will be more likely to keep weight off if you make these changes part of your lifestyle. Exercise at least 30 minutes per day on most days of the week. You can also fit in more physical activity by taking the stairs instead of the elevator or parking farther away from stores. Ask your healthcare provider about the best exercise plan for you.       Healthy meal plan for weight management:  A healthy meal plan includes a variety of foods, contains fewer calories, and helps you stay healthy. A healthy meal plan includes the following:      Eat whole-grain foods more often.  A healthy meal plan should contain fiber. Fiber is the part of grains, fruits, and vegetables that is not broken down by your body. Whole-grain foods are healthy and provide extra fiber in your diet. Some examples of whole-grain foods are whole-wheat breads and pastas, oatmeal, brown rice, and bulgur.     Eat a variety of vegetables every day.  Include dark, leafy greens such as spinach, kale, collard greens, and mustard  greens. Eat yellow and orange vegetables such as carrots, sweet potatoes, and winter squash.      Eat a variety of fruits every day.  Choose fresh or canned fruit (canned in its own juice or light syrup) instead of juice. Fruit juice has very little or no fiber.     Eat low-fat dairy foods.  Drink fat-free (skim) milk or 1% milk. Eat fat-free yogurt and low-fat cottage cheese. Try low-fat cheeses such as mozzarella and other reduced-fat cheeses.     Choose meat and other protein foods that are low in fat.  Choose beans or other legumes such as split peas or lentils. Choose fish, skinless poultry (chicken or [place]), or lean cuts of red meat (beef or pork). Before you cook meat or poultry, cut off any visible fat.      Use less fat and oil.  Try baking foods instead of frying them. Add less fat, such as margarine, sour cream, regular salad dressing and mayonnaise to foods. Eat fewer high-fat foods. Some examples of high-fat foods include french fries, doughnuts, ice cream, and cakes.     Eat fewer sweets.  Limit foods and drinks that are high in sugar. This includes candy, cookies, regular soda, and sweetened drinks.  Ways to decrease calories:    Eat smaller portions.     ? Use a small plate with  smaller servings.    ? Do not eat second helpings.    ? When you eat at a restaurant, ask for a box and place half of your meal in the box before you eat.    ? Share an Ashley with someone else.     Replace high-calorie snacks with healthy, low-calorie snacks.     ? Choose fresh fruit, vegetables, fat-free rice cakes, or air-popped popcorn instead of potato chips, nuts, or chocolate.    ? Choose water or calorie-free drinks instead of soda or sweetened drinks.     Do not shop for groceries when you are hungry.  You may be more likely to make unhealthy food choices. Take a grocery list of healthy foods and shop after you have eaten.     Eat regular meals. Do not skip meals. Skipping meals can lead to overeating  later in the day. This can make it harder for you to lose weight. Eat a healthy snack in place of a meal if you do not have time to eat a regular meal. Talk with a dietitian to help you create a meal plan and schedule that is right for you.    Other things to consider as you try to lose weight:    Be aware of situations that may give you the urge to overeat, such as eating while watching television. Find ways to avoid these situations. For example, read a book, go for a walk, or do crafts.     Meet with a weight loss support group or friends who are also trying to lose weight. This may help you stay motivated to continue working on your weight loss goals.     Copyright [employer] Information is for [employer] use only and may not be sold, redistributed or otherwise used for commercial purposes. All illustrations and images included in CareNotes are the copyrighted property of [employer]M., Inc[employer] or [place]  The above information is an educational aid only. It is not intended as medical advice for individual conditions or treatments. Talk to your doctor, nurse or pharmacist before following any medical regimen to see if it is safe and effective for you.     Healthy Practices for Women of all Ages     In addition to your regular OB/GYN history, physical, cholesterol and diabetes screenings, cervical cancer screen, and other recommended cancer screening tests, we ask that you review this list of healthy practices.  Modifying your daily activities according to these practices may improve your overall health and well-being.  In the event of questions about this list, ask your health care provider.  Additionally, there are specially written pamphlets available, which offer further information.    Diet and Exercise:  - Limit fat and cholesterol; emphasize fruits, grains and vegetables.  - Consume dairy products or use calcium supplementation for adequate calcium intake (1200 mg or more).  - Add folic  acid supplementation 0.4 mg (400 micrograms, present in most daily multivitamins) at least two months before considering pregnancy to reduce the risks of birth defects.  - Participate in regular exercise for 30 minutes at least five times a week, and consider weight training.    Injury Prevention:  - Seat/lap belts should be worn while in a moving car.  - Helmet should be used when using motorcycles, bicycles, roller blades and ATVs or skiing.  - Place approved smoke detectors in your house and replace the batteries twice a year.    -  Guns and other firearms should be stored unloaded and in a locked area.  Trigger locks should be used as well.  - Consider CPR training for household members.  - Do not Text and Drive.    Dental Health:  - Schedule regular visits to the dentist.  - Floss and brush with fluoride toothpaste daily.    Immunizations:  - A tetanus/diphtheria booster shot (d/T) is recommended every 10 years.  - An MMR vaccine is recommended for non-pregnant women born after 1957 without proof of immunity of documentation of previous immunization.  Adults who are susceptible to varicella (chicken pox) should be vaccinated.  - Influenza vaccine is indicated yearly for all patients.  - Pneumococcal pneumonia vaccine is indicated for age 65 and older once in your life.  - Hepatitis A and/or B vaccines are recommended for high-risk individuals.    Substance Abuse:  - Stop smoking; do not use any other tobacco products.  - Avoid alcohol use when driving, boating, swimming or operating other machinery. Avoid excessive use of alcoholic beverages.  - Recreational drug use (marijuana, cocaine, etc.) is dangerous and can be habit-forming.    Sexual Behavior:  - Be sure to use contraception if pregnancy is not desired.  - Regular use of female or female condoms with spermicide helps prevent STD’s.  - Consider HIV testing if:  1. You have had more than one sexual partner.  2. You have had any STD’s.  3. You have used  intravenous drugs.  4. You have a sexual partner with these (the above) risk factors.  5. Your sexual partner has had female homosexual exposure.  6. You received a blood transfusion during 1978-1985.    Breast Health:  - A mammogram should be done once at age 40, then every 1-2 years based on risk factors.  If there is a strong family history of premenopausal breast cancer, you may need to start with mammograms earlier than age 40.    Colon Cancer Surveillance:  - Beginning at age 50, stool occult blood screening should be done annually and/or sigmoidoscopy (scope examination of the colon) every 3-5 years.    Women and Alcohol  For women, alcohol use carries some unique and special concerns. Most studies on alcohol have been on men, but we now know women may respond differently to alcohol.   Concerns: Liver damage, cancer, menstrual cycle changes, birth defects, fetal alcohol syndrome, intoxication, nutrition and weight management.  All women should avoid all alcohol during pregnancy.  Help for Problem Drinking:  • One in every three members of Alcoholics Anonymous (AA) is now a woman.  AA meetings are held regularly and have led the way in demonstrating the effectiveness of self-help.  • Outpatient and Inpatient Treatment are also available.  Ask your health care provider for a referral.  • The first step in successful treatment is to recognize that there is a problem and accept help.    Dietary and Supplemental Calcium  It is important that you build and protect your bone mass through a program of regular exercise and proper nutrition with adequate amounts of calcium in your diet. Dairy products are considered to be the most important sources of calcium.

## 2021-06-02 ENCOUNTER — Other Ambulatory Visit: Payer: Self-pay | Admitting: Obstetrics and Gynecology

## 2021-06-02 DIAGNOSIS — Z3046 Encounter for surveillance of implantable subdermal contraceptive: Secondary | ICD-10-CM

## 2021-06-09 ENCOUNTER — Encounter: Payer: Self-pay | Admitting: Physical Medicine and Rehabilitation

## 2021-06-09 LAB — GYN CYTOLOGY

## 2021-06-10 ENCOUNTER — Ambulatory Visit: Payer: Medicaid Other | Admitting: Physical Medicine and Rehabilitation

## 2021-06-11 ENCOUNTER — Encounter: Payer: Self-pay | Admitting: Physical Medicine and Rehabilitation

## 2021-06-11 ENCOUNTER — Ambulatory Visit
Payer: Medicaid Other | Attending: Physical Medicine and Rehabilitation | Admitting: Physical Medicine and Rehabilitation

## 2021-06-11 DIAGNOSIS — M62838 Other muscle spasm: Secondary | ICD-10-CM | POA: Insufficient documentation

## 2021-06-11 MED ORDER — ONABOTULINUMTOXINA 100 UNIT IJ SOLR *I*
100.0000 [IU] | Freq: Once | INTRAMUSCULAR | Status: AC | PRN
Start: 2021-06-11 — End: 2021-06-11
  Administered 2021-06-11: 100 [IU] via INTRAMUSCULAR

## 2021-06-11 NOTE — Procedures (Signed)
PMR Botox Injections  Performed by: Alena Bills, MD  Authorized by: Alena Bills, MD     Date/time: 06/11/2021 10:15 AM EDT  Injections:  68088 - Chemodenervation 1 ext 1-4 muscles and 64643 - Chemodenervation 1 ext 1-4 muscles, 1st addtl  Medication:  100 units botulinum toxin type A 100 units; 100 units botulinum toxin type A 100 units; 100 units botulinum toxin type A 100 units; 100 units botulinum toxin type A 100 units; 100 units botulinum toxin type A 100 units; 100 units botulinum toxin type A 100 units   See below    Procedure Details   The risks, benefits, indications, potential complications, and alternatives were explained to the patient and/or guardian who verbalized understanding and informed consent obtained.     The area for injection was identified and a time out called to re-identify.      The correct patient was identified with 2 identifiers The correct procedure, location site(s) and laterality were identified with the patient and/or guardian and correspond to the consent form  The appropriate site was marked and verified.  The patient was placed in the correct position.    After prepping the skin with alcohol overlying the following muscles, botlinum toxin was injected intramuscularly as follows.     Right and left quadriceps (vastus lateralis 75, vastus medialis 75, vastus intermedius 75, rectus femoris 75) 300 + 300 units      Botulinum toxin Lot #: [id number]  Botulinum toxin expiration date: 08/2022  Total botox units injected: 600 units   Total botox units wasted: 0 units     The patient tolerated the procedure without complications.    Patient will follow up in 12 weeks for repeat botox injection     Alena Bills, MD

## 2021-06-13 ENCOUNTER — Encounter: Payer: Self-pay | Admitting: Internal Medicine

## 2021-06-18 ENCOUNTER — Encounter: Payer: Self-pay | Admitting: Emergency Medicine

## 2021-06-18 ENCOUNTER — Inpatient Hospital Stay
Admission: EM | Admit: 2021-06-18 | Discharge: 2021-06-24 | DRG: 420 | Disposition: A | Discharge status: Home Health | Payer: Medicaid Other | Source: Ambulatory Visit | Attending: Internal Medicine | Admitting: Internal Medicine

## 2021-06-18 ENCOUNTER — Ambulatory Visit: Payer: Medicaid Other | Admitting: Internal Medicine

## 2021-06-18 ENCOUNTER — Other Ambulatory Visit: Payer: Self-pay

## 2021-06-18 VITALS — BP 90/62 | HR 114 | Temp 97.9°F | Ht 64.0 in | Wt 140.0 lb

## 2021-06-18 DIAGNOSIS — R631 Polydipsia: Secondary | ICD-10-CM

## 2021-06-18 DIAGNOSIS — K649 Unspecified hemorrhoids: Secondary | ICD-10-CM | POA: Diagnosis present

## 2021-06-18 DIAGNOSIS — E1165 Type 2 diabetes mellitus with hyperglycemia: Secondary | ICD-10-CM

## 2021-06-18 DIAGNOSIS — K5909 Other constipation: Secondary | ICD-10-CM

## 2021-06-18 DIAGNOSIS — R3589 Other polyuria: Secondary | ICD-10-CM

## 2021-06-18 DIAGNOSIS — R011 Cardiac murmur, unspecified: Secondary | ICD-10-CM | POA: Diagnosis present

## 2021-06-18 DIAGNOSIS — Z87891 Personal history of nicotine dependence: Secondary | ICD-10-CM

## 2021-06-18 DIAGNOSIS — A419 Sepsis, unspecified organism: Secondary | ICD-10-CM | POA: Diagnosis not present

## 2021-06-18 DIAGNOSIS — G8250 Quadriplegia, unspecified: Secondary | ICD-10-CM | POA: Diagnosis present

## 2021-06-18 DIAGNOSIS — G4700 Insomnia, unspecified: Secondary | ICD-10-CM

## 2021-06-18 DIAGNOSIS — K592 Neurogenic bowel, not elsewhere classified: Secondary | ICD-10-CM | POA: Diagnosis present

## 2021-06-18 DIAGNOSIS — I517 Cardiomegaly: Secondary | ICD-10-CM

## 2021-06-18 DIAGNOSIS — I959 Hypotension, unspecified: Secondary | ICD-10-CM | POA: Diagnosis present

## 2021-06-18 DIAGNOSIS — N390 Urinary tract infection, site not specified: Secondary | ICD-10-CM | POA: Diagnosis present

## 2021-06-18 DIAGNOSIS — R9431 Abnormal electrocardiogram [ECG] [EKG]: Secondary | ICD-10-CM

## 2021-06-18 DIAGNOSIS — K5900 Constipation, unspecified: Secondary | ICD-10-CM | POA: Diagnosis present

## 2021-06-18 DIAGNOSIS — R42 Dizziness and giddiness: Secondary | ICD-10-CM

## 2021-06-18 DIAGNOSIS — F32A Depression, unspecified: Secondary | ICD-10-CM | POA: Diagnosis present

## 2021-06-18 DIAGNOSIS — R000 Tachycardia, unspecified: Secondary | ICD-10-CM | POA: Diagnosis present

## 2021-06-18 DIAGNOSIS — E119 Type 2 diabetes mellitus without complications: Secondary | ICD-10-CM

## 2021-06-18 DIAGNOSIS — Z20822 Contact with and (suspected) exposure to covid-19: Secondary | ICD-10-CM | POA: Diagnosis present

## 2021-06-18 DIAGNOSIS — Z6822 Body mass index (BMI) 22.0-22.9, adult: Secondary | ICD-10-CM

## 2021-06-18 DIAGNOSIS — D696 Thrombocytopenia, unspecified: Secondary | ICD-10-CM | POA: Diagnosis present

## 2021-06-18 DIAGNOSIS — E860 Dehydration: Secondary | ICD-10-CM | POA: Diagnosis present

## 2021-06-18 DIAGNOSIS — E669 Obesity, unspecified: Secondary | ICD-10-CM | POA: Diagnosis present

## 2021-06-18 DIAGNOSIS — Z936 Other artificial openings of urinary tract status: Secondary | ICD-10-CM

## 2021-06-18 DIAGNOSIS — N319 Neuromuscular dysfunction of bladder, unspecified: Secondary | ICD-10-CM | POA: Diagnosis present

## 2021-06-18 DIAGNOSIS — Z87442 Personal history of urinary calculi: Secondary | ICD-10-CM

## 2021-06-18 HISTORY — DX: Type 2 diabetes mellitus without complications: E11.9

## 2021-06-18 LAB — BASIC METABOLIC PANEL
Anion Gap: 13 (ref 7–16)
CO2: 20 mmol/L (ref 20–28)
Calcium: 9.6 mg/dL (ref 8.8–10.2)
Chloride: 100 mmol/L (ref 96–108)
Creatinine: 0.41 mg/dL — ABNORMAL LOW (ref 0.51–0.95)
Glucose: 461 mg/dL — ABNORMAL HIGH (ref 60–99)
Lab: 11 mg/dL (ref 6–20)
Potassium: 3.8 mmol/L (ref 3.3–5.1)
Sodium: 133 mmol/L (ref 133–145)
eGFR BY CREAT: 131 *

## 2021-06-18 LAB — CBC AND DIFFERENTIAL
Baso # K/uL: 0 10*3/uL (ref 0.0–0.1)
Basophil %: 0.2 %
Eos # K/uL: 0 10*3/uL (ref 0.0–0.4)
Eosinophil %: 0.2 %
Hematocrit: 41 % (ref 34–45)
Hemoglobin: 13.6 g/dL (ref 11.2–15.7)
IMM Granulocytes #: 0 10*3/uL (ref 0.0–0.0)
IMM Granulocytes: 0.3 %
Lymph # K/uL: 1.4 10*3/uL (ref 1.2–3.7)
Lymphocyte %: 22.6 %
MCH: 30 pg (ref 26–32)
MCHC: 33 g/dL (ref 32–36)
MCV: 91 fL (ref 79–95)
Mono # K/uL: 0.5 10*3/uL (ref 0.2–0.9)
Monocyte %: 8.7 %
Neut # K/uL: 4.2 10*3/uL (ref 1.6–6.1)
Nucl RBC # K/uL: 0 10*3/uL (ref 0.0–0.0)
Nucl RBC %: 0 /100 WBC (ref 0.0–0.2)
Platelets: 89 10*3/uL — ABNORMAL LOW (ref 160–370)
RBC: 4.5 MIL/uL (ref 3.9–5.2)
RDW: 13.1 % (ref 11.7–14.4)
Seg Neut %: 68 %
WBC: 6.1 10*3/uL (ref 4.0–10.0)

## 2021-06-18 LAB — RUQ PANEL (ED ONLY)
ALT: 36 U/L — ABNORMAL HIGH (ref 0–35)
AST: 20 U/L (ref 0–35)
Albumin: 3.8 g/dL (ref 3.5–5.2)
Alk Phos: 90 U/L (ref 35–105)
Amylase: 27 U/L — ABNORMAL LOW (ref 28–100)
Bilirubin,Direct: <0.2 mg/dL (ref 0.0–0.3)
Bilirubin,Total: 0.3 mg/dL (ref 0.0–1.2)
Lipase: 43 U/L (ref 13–60)
Total Protein: 7.1 g/dL (ref 6.3–7.7)

## 2021-06-18 LAB — COVID-19 NAAT (PCR): COVID-19 NAAT (PCR): NEGATIVE

## 2021-06-18 LAB — VENOUS GASES / WHOLE BLOOD PANEL
Base Excess,VENOUS: -3 mmol/L (ref <=2)
Bicarbonate,VENOUS: 21 mmol/L (ref 21–28)
CO2 (Calc),VENOUS: 22 mmol/L (ref 22–31)
CO: 1.1 %
FO2 HB,VENOUS: 96 % — ABNORMAL HIGH (ref 63–83)
Glucose,WB: 446 mg/dL — ABNORMAL HIGH (ref 60–99)
Hemoglobin: 14.3 g/dL (ref 11.2–15.7)
ICA @7.4,WB: 4.9 mg/dL (ref 4.8–5.2)
ICA Uncorr,WB: 4.9 mg/dL (ref 4.8–5.2)
Lactate VEN,WB: 1.5 mmol/L (ref 0.5–2.2)
Methemoglobin: 0.3 % (ref 0.0–1.0)
NA, WB: 133 mmol/L — ABNORMAL LOW (ref 135–145)
PCO2,VENOUS: 34 mm Hg — ABNORMAL LOW (ref 40–50)
PH,VENOUS: 7.42 (ref 7.32–7.42)
PO2,VENOUS: 96 mm Hg — ABNORMAL HIGH (ref 25–43)
Potassium,WB: 3.6 mmol/L (ref 3.3–4.6)

## 2021-06-18 LAB — BETA HYDROXYBUTYRATE: Beta Hydroxybutyrate: 0.39 mmol/L — ABNORMAL HIGH (ref 0.02–0.27)

## 2021-06-18 LAB — POCT GLUCOSE
Glucose POCT: >600 mg/dL (ref 60–99)
Glucose POCT: 533 mg/dL (ref 60–99)
Glucose POCT: 294 mg/dL — ABNORMAL HIGH (ref 60–99)
Glucose POCT: 357 mg/dL — ABNORMAL HIGH (ref 60–99)
Glucose POCT: 354 mg/dL — ABNORMAL HIGH (ref 60–99)

## 2021-06-18 LAB — PHOSPHORUS: Phosphorus: 2.5 mg/dL — ABNORMAL LOW (ref 2.7–4.5)

## 2021-06-18 LAB — OSMOLALITY: Osmolality: 299 mOsm/Kg H2O — ABNORMAL HIGH (ref 278–297)

## 2021-06-18 LAB — PERFORMING LAB

## 2021-06-18 LAB — MAGNESIUM: Magnesium: 1.7 mg/dL (ref 1.6–2.5)

## 2021-06-18 MED ORDER — GLUCAGON HCL (RDNA) 1 MG IJ SOLR *WRAPPED*
1.0000 mg | INTRAMUSCULAR | Status: DC | PRN
Start: 2021-06-18 — End: 2021-06-20

## 2021-06-18 MED ORDER — INSULIN REGULAR HUMAN 100 UNIT/ML IJ SOLN *I*
12.0000 [IU] | Freq: Once | INTRAMUSCULAR | Status: AC
Start: 2021-06-18 — End: 2021-06-18
  Administered 2021-06-18: 12 [IU] via SUBCUTANEOUS

## 2021-06-18 MED ORDER — ACETAMINOPHEN 500 MG PO TABS *I*
500.0000 mg | ORAL_TABLET | Freq: Four times a day (QID) | ORAL | Status: DC | PRN
Start: 2021-06-18 — End: 2021-06-19
  Administered 2021-06-18: 500 mg via ORAL
  Filled 2021-06-18: qty 1

## 2021-06-18 MED ORDER — DEXTROSE 5 % FLUSH FOR PUMPS *I*
0.0000 mL/h | INTRAVENOUS | Status: DC | PRN
Start: 2021-06-18 — End: 2021-06-24

## 2021-06-18 MED ORDER — DEXTROSE 50 % IV SOLN *I*
25.0000 g | INTRAVENOUS | Status: DC | PRN
Start: 2021-06-18 — End: 2021-06-20

## 2021-06-18 MED ORDER — INSULIN GLARGINE 100 UNIT/ML SC SOLN *WRAPPED*
10.0000 [IU] | Freq: Every evening | SUBCUTANEOUS | Status: DC
Start: 2021-06-18 — End: 2021-06-19
  Administered 2021-06-18: 10 [IU] via SUBCUTANEOUS

## 2021-06-18 MED ORDER — LACTATED RINGERS IV BOLUS *I*
1000.0000 mL | Freq: Once | INTRAVENOUS | Status: AC
Start: 2021-06-18 — End: 2021-06-18
  Administered 2021-06-18: 1000 mL via INTRAVENOUS

## 2021-06-18 MED ORDER — FONDAPARINUX SODIUM 2.5 MG/0.5ML SC SOLN *I*
2.5000 mg | Freq: Every day | SUBCUTANEOUS | Status: DC
Start: 2021-06-18 — End: 2021-06-21
  Administered 2021-06-18 – 2021-06-20 (×3): 2.5 mg via SUBCUTANEOUS
  Filled 2021-06-18 (×7): qty 0.5

## 2021-06-18 MED ORDER — SODIUM CHLORIDE 0.9 % FLUSH FOR PUMPS *I*
0.0000 mL/h | INTRAVENOUS | Status: DC | PRN
Start: 2021-06-18 — End: 2021-06-24
  Administered 2021-06-20: 20 mL/h
  Administered 2021-06-20 (×2): 220 mL/h
  Administered 2021-06-20: 220 mL/h via INTRAVENOUS
  Administered 2021-06-23: 20 mL/h
  Administered 2021-06-23: 100 mL/h
  Administered 2021-06-23: 50 mL/h via INTRAVENOUS
  Administered 2021-06-23: 100 mL/h via INTRAVENOUS

## 2021-06-18 MED ORDER — SODIUM CHLORIDE 0.9 % FLUSH FOR PUMPS *I*
0.0000 mL/h | INTRAVENOUS | Status: DC | PRN
Start: 2021-06-18 — End: 2021-06-24
  Administered 2021-06-21: 20 mL/h via INTRAVENOUS

## 2021-06-18 MED ORDER — SODIUM CHLORIDE 0.9 % IV SOLN WRAPPED *I*
125.0000 mL/h | Status: DC
Start: 2021-06-18 — End: 2021-06-19

## 2021-06-18 MED ORDER — GLUCOSE 15 GM/32ML PO GEL *I*
15.0000 g | ORAL | Status: DC | PRN
Start: 2021-06-18 — End: 2021-06-20

## 2021-06-18 MED ORDER — BISACODYL 10 MG RE SUPP *I*
10.0000 mg | RECTAL | Status: DC
Start: 2021-06-19 — End: 2021-06-24
  Administered 2021-06-21: 10 mg via RECTAL
  Filled 2021-06-18 (×2): qty 1

## 2021-06-18 MED ORDER — HYDROCORTISONE ACETATE 25 MG RE SUPP *I*
25.0000 mg | RECTAL | Status: DC
Start: 2021-06-19 — End: 2021-06-24
  Administered 2021-06-19 – 2021-06-21 (×2): 25 mg via RECTAL
  Filled 2021-06-18 (×3): qty 1

## 2021-06-18 MED ORDER — INSULIN LISPRO (HUMAN) 100 UNIT/ML IJ/SC SOLN *WRAPPED*
0.0000 [IU] | Freq: Three times a day (TID) | SUBCUTANEOUS | Status: DC
Start: 2021-06-18 — End: 2021-06-20
  Administered 2021-06-18: 9 [IU] via SUBCUTANEOUS
  Administered 2021-06-19: 7 [IU] via SUBCUTANEOUS
  Administered 2021-06-19: 8 [IU] via SUBCUTANEOUS
  Administered 2021-06-19 – 2021-06-20 (×3): 6 [IU] via SUBCUTANEOUS
  Filled 2021-06-18: qty 3
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
## 2021-06-18 NOTE — Progress Notes (Signed)
12 units regular insulin given SQ left abdomin- patient tolerated well.  IV access not obtained prior to transfer to ED due to difficulty of patient stick.  Mother with patient at time of transfer.
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
## 2021-06-18 NOTE — Addendum Note (Signed)
Addended by: Bill Salinas on: 06/18/2021 12:13 PM     Modules accepted: Level of Service

## 2021-06-18 NOTE — ED Triage Notes (Addendum)
At internal medicine appointment c/o polydipsia, polyuria. BG checked was >600, given 12 units regular insulin at 1206. +lightheadedness. Denies nausea, vomiting.        Prehospital medications given: No

## 2021-06-18 NOTE — Progress Notes (Signed)
Person Outreached to: Patient     Result of attempt: CM met with patient at her appointment today.     Details of communication:CM met with patient during her appointment at [facility] today. Patient stated she has been having concerns with frequent urination, and dehydration. Patient stated she has been drinking 3-4 gallons of water a day. Patient is concerned this is a symptom of diabetes and she wanted to discuss this concern with the provider today. Patient stated she had no other concerns at this time. CM and patient also reviewed her upcoming appointments, and patient will update CM with any transportation concerns.     Follow Up Plan: CM will follow up with patients provider regarding these concerns.
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
## 2021-06-19 ENCOUNTER — Other Ambulatory Visit: Payer: Self-pay

## 2021-06-19 LAB — CBC AND DIFFERENTIAL
Baso # K/uL: 0 10*3/uL (ref 0.0–0.1)
Basophil %: 0.2 %
Eos # K/uL: 0 10*3/uL (ref 0.0–0.4)
Eosinophil %: 0 %
Hematocrit: 41 % (ref 34–45)
Hemoglobin: 13.3 g/dL (ref 11.2–15.7)
IMM Granulocytes #: 0 10*3/uL (ref 0.0–0.0)
IMM Granulocytes: 0.2 %
Lymph # K/uL: 1.7 10*3/uL (ref 1.2–3.7)
Lymphocyte %: 35.1 %
MCH: 30 pg (ref 26–32)
MCHC: 32 g/dL (ref 32–36)
MCV: 93 fL (ref 79–95)
Mono # K/uL: 0.3 10*3/uL (ref 0.2–0.9)
Monocyte %: 7 %
Neut # K/uL: 2.7 10*3/uL (ref 1.6–6.1)
Nucl RBC # K/uL: 0 10*3/uL (ref 0.0–0.0)
Nucl RBC %: 0 /100 WBC (ref 0.0–0.2)
Platelets: 76 10*3/uL — ABNORMAL LOW (ref 160–370)
RBC: 4.4 MIL/uL (ref 3.9–5.2)
RDW: 13.3 % (ref 11.7–14.4)
Seg Neut %: 57.5 %
WBC: 4.7 10*3/uL (ref 4.0–10.0)

## 2021-06-19 LAB — BASIC METABOLIC PANEL
Anion Gap: 13 (ref 7–16)
CO2: 20 mmol/L (ref 20–28)
Calcium: 9.5 mg/dL (ref 8.8–10.2)
Chloride: 100 mmol/L (ref 96–108)
Creatinine: 0.47 mg/dL — ABNORMAL LOW (ref 0.51–0.95)
Glucose: 409 mg/dL — ABNORMAL HIGH (ref 60–99)
Lab: 15 mg/dL (ref 6–20)
Sodium: 133 mmol/L (ref 133–145)
eGFR BY CREAT: 127 *

## 2021-06-19 LAB — POCT GLUCOSE
Glucose POCT: 257 mg/dL — ABNORMAL HIGH (ref 60–99)
Glucose POCT: 263 mg/dL — ABNORMAL HIGH (ref 60–99)
Glucose POCT: 282 mg/dL — ABNORMAL HIGH (ref 60–99)
Glucose POCT: 304 mg/dL — ABNORMAL HIGH (ref 60–99)
Glucose POCT: 349 mg/dL — ABNORMAL HIGH (ref 60–99)
Glucose POCT: 415 mg/dL — ABNORMAL HIGH (ref 60–99)

## 2021-06-19 LAB — URINALYSIS REFLEX TO CULTURE
Nitrite,UA: NEGATIVE
Specific Gravity,UA: 1.015 (ref 1.002–1.030)
pH,UA: 8.5 — ABNORMAL HIGH (ref 5.0–8.0)

## 2021-06-19 LAB — EKG 12-LEAD
P: 60 deg
PR: 170 ms
QRS: 67 deg
QRSD: 86 ms
QT: 335 ms
QTc: 431 ms
Rate: 100 {beats}/min
T: 74 deg

## 2021-06-19 LAB — URINE MICROSCOPIC (IQ200)
RBC,UA: NONE SEEN /hpf (ref 0–2)
WBC,UA: >50 /hpf — AB (ref 0–5)

## 2021-06-19 LAB — HOLD EXTRA URINE

## 2021-06-19 LAB — HEMOGLOBIN A1C: Hemoglobin A1C: 11.5 % — ABNORMAL HIGH

## 2021-06-19 MED ORDER — ACETAMINOPHEN 500 MG PO TABS *I*
500.0000 mg | ORAL_TABLET | Freq: Once | ORAL | Status: AC
Start: 2021-06-19 — End: 2021-06-20
  Filled 2021-06-19: qty 1

## 2021-06-19 MED ORDER — LACTATED RINGERS IV BOLUS *I*
1000.0000 mL | Freq: Once | INTRAVENOUS | Status: AC
Start: 2021-06-19 — End: 2021-06-19
  Administered 2021-06-19: 1000 mL via INTRAVENOUS

## 2021-06-19 MED ORDER — FREESTYLE LITE TEST VI STRP *A*
ORAL_STRIP | Freq: Four times a day (QID) | 1 refills | Status: DC
Start: 2021-06-19 — End: 2021-10-13
  Filled 2021-06-19: qty 100, 25d supply, fill #0
  Filled 2021-08-15: qty 100, 25d supply, fill #1

## 2021-06-19 MED ORDER — PIPERACILLIN/TAZOBACTAM 4.5 G IN NS MINI-BAG PLUS 110 ML *I*
4.5000 g | Freq: Three times a day (TID) | INTRAVENOUS | Status: DC
Start: 2021-06-20 — End: 2021-06-19

## 2021-06-19 MED ORDER — MELATONIN 3 MG PO TABS *I*
3.0000 mg | ORAL_TABLET | Freq: Every evening | ORAL | Status: DC | PRN
Start: 2021-06-19 — End: 2021-06-24
  Administered 2021-06-19: 3 mg via ORAL
  Filled 2021-06-19: qty 1

## 2021-06-19 MED ORDER — CEFEPIME 2GM IN D5W 110 ML IVPB *I*
2000.0000 mg | Freq: Two times a day (BID) | INTRAVENOUS | Status: DC
Start: 2021-06-19 — End: 2021-06-23
  Administered 2021-06-19 – 2021-06-23 (×8): 2000 mg via INTRAVENOUS
  Filled 2021-06-19: qty 110
  Filled 2021-06-19: qty 2
  Filled 2021-06-19 (×3): qty 110
  Filled 2021-06-19: qty 2
  Filled 2021-06-19 (×2): qty 110
  Filled 2021-06-19: qty 2
  Filled 2021-06-19: qty 110
  Filled 2021-06-19 (×3): qty 2

## 2021-06-19 MED ORDER — METFORMIN HCL 500 MG PO TABS *I*
500.0000 mg | ORAL_TABLET | Freq: Two times a day (BID) | ORAL | Status: DC
Start: 2021-06-19 — End: 2021-06-20
  Administered 2021-06-19 – 2021-06-20 (×2): 500 mg via ORAL
  Filled 2021-06-19 (×2): qty 1

## 2021-06-19 MED ORDER — PIPERACILLIN/TAZOBACTAM 4.5 G IN NS MINI-BAG PLUS 110 ML *I*
4.5000 g | Freq: Once | INTRAVENOUS | Status: DC
Start: 2021-06-19 — End: 2021-06-19

## 2021-06-19 MED ORDER — INSULIN REGULAR HUMAN 100 UNIT/ML IJ SOLN *I*
8.0000 [IU] | Freq: Once | INTRAMUSCULAR | Status: AC
Start: 2021-06-19 — End: 2021-06-19
  Administered 2021-06-19: 8 [IU] via SUBCUTANEOUS

## 2021-06-19 MED ORDER — CEFEPIME-DEXTROSE 1 GM/58ML IV SOLR *I*
1000.0000 mg | Freq: Two times a day (BID) | INTRAVENOUS | Status: DC
Start: 2021-06-19 — End: 2021-06-19

## 2021-06-19 MED ORDER — ACETAMINOPHEN 500 MG PO TABS *I*
1000.0000 mg | ORAL_TABLET | Freq: Three times a day (TID) | ORAL | Status: DC | PRN
Start: 2021-06-19 — End: 2021-06-24
  Administered 2021-06-19 – 2021-06-20 (×2): 1000 mg via ORAL
  Filled 2021-06-19: qty 2

## 2021-06-19 MED ORDER — FREESTYLE LITE W/DEVICE KIT *A*
PACK | 0 refills | Status: DC
Start: 2021-06-19 — End: 2022-08-10
  Filled 2021-06-19: qty 1, 1d supply, fill #0

## 2021-06-19 MED ORDER — INSULIN GLARGINE 100 UNIT/ML SC SOLN *WRAPPED*
14.0000 [IU] | Freq: Every evening | SUBCUTANEOUS | Status: DC
Start: 2021-06-19 — End: 2021-06-20
  Administered 2021-06-19: 14 [IU] via SUBCUTANEOUS

## 2021-06-19 MED ORDER — FREESTYLE LANCETS MISC *A*
1 refills | Status: DC
Start: 2021-06-19 — End: 2021-06-25
  Filled 2021-06-19: qty 100, 25d supply, fill #0

## 2021-06-19 MED ORDER — ALCOHOL SWABS PADS *I*
MEDICATED_PAD | 1 refills | Status: DC
Start: 2021-06-19 — End: 2021-10-13
  Filled 2021-06-19: qty 100, 25d supply, fill #0
  Filled 2021-08-11: qty 100, 25d supply, fill #1

## 2021-06-19 NOTE — Progress Notes (Signed)
Pt admit from the ED. Placed on tele. Repositioned and cleaned up, new pads. Pt has no glasses, hearing aides, dentures. 4 eye skin assessment completed, no open skin. dinger bell given to call for help.

## 2021-06-19 NOTE — ED Notes (Signed)
ED NURSE RESIDENT ATTESTATION       I Sindy Guadeloupe have reviewed the following charting information by the Nurse Resident: Gwen Her, RN     Nursing Assessments  Medications  Plan of Care  Teaching   Notes    In the chart of Cindy Ochoa (35 y.o. female) and attest to the charting being accurate.

## 2021-06-19 NOTE — Progress Notes (Signed)
Hospital Medicine Progress Note       LOS: 1 day     Subjective: Seen this AM.  Denies fever/chills.  No CP or SOB.  No nausea/vomiting.  Denies any pain.      Objective:    BP: (109-169)/(62-108)   Temp:  [37.2 C (99 F)-37.4 C (99.3 F)]   Temp src: Oral (06/23 0300)  Heart Rate:  [104-126]   Resp:  [11-19]   SpO2:  [97 %-100 %]     Last set of vitals:  Vitals:    06/19/21 0812   BP: 111/75   Pulse: 104   Resp: 17   Temp:          Intake/Output Summary (Last 24 hours) at 06/19/2021 1546  Last data filed at 06/19/2021 1542  Gross per 24 hour   Intake 1000 ml   Output 4000 ml   Net -3000 ml       Exam:   General: NAD, sitting up in bed  HEENT: MMM, OP clear  Cardiac: NRR  Resp: CTAB anteriorly, normal WOB  Abd: Soft, NT/D, normal BS      Labs:  Recent Labs   Lab 06/19/21  0047 06/18/21  1404   WBC 4.7 6.1   Hematocrit 41 41   Platelets 76* 89*     Recent Labs   Lab 06/19/21  0047 06/18/21  1404   Sodium 133 133   Potassium CANCELED 3.8   Chloride 100 100   CO2 20 20   UN 15 11   Creatinine 0.47* 0.41*     Recent Labs   Lab 06/18/21  1404   AST 20   ALT 36*     No components found with this basename: LABALBU, LASTTBILI, ALKPHOS        Radiology:  No results found.    Current Meds:  Scheduled Meds:   insulin glargine  14 units Subcutaneous Nightly    metFORMIN  500 mg Oral BID WC    insulin lispro  0-20 units Subcutaneous TID WC    bisacodyl  10 mg Rectal Every Other Day    hydrocortisone  25 mg Rectal Every Other Day    fondaparinux  2.5 mg Subcutaneous Daily     Continuous Infusions:  PRN Meds:.   melatonin  3 mg Oral QHS PRN    sodium chloride  0-500 mL/hr Intravenous PRN    dextrose  0-500 mL/hr Intravenous PRN    dextrose  15 g of Glucose Oral PRN    And    dextrose  25 g Intravenous PRN    And    glucagon  1 mg Intramuscular PRN    acetaminophen  500 mg Oral Q6H PRN    sodium chloride  0-500 mL/hr Intravenous PRN    dextrose  0-500 mL/hr Intravenous PRN       Assessment/Plan:  Active Problems:     Diabetes mellitus    Case of hyperglycemia with likely new diagnosis of DM2 in a 35 yo F with hx of C6 Quadriparesis, obesity, 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      1.  Hyperglycemia:  -Discussed with PCP and Endocrine fellow  -challenge will be her functional status and she doesn't think she'll be able to do a basal/bolus regimen at home.    -Endocrine to see tomorrow AM  -will start metformin 500mg  BID  -increase lantus to 14u qHS  -cont LISS  -Diabetes education done    2.  Hx Hemorrhoids:  -  cont hydrocortisone suppository    3.  Hx of ITP:  -cont fondaparinux for prophylaxis    EMRD:  Possibly 6/24 pending ability to control BGs at home, possibly with a more simplified regimen.      I personally spent >35 minutes with patient care with >50% time care/coordination and counseling of care.      Code Status: Full    There is no height or weight on file to calculate BMI.        Signed by Lynnea Maizes pager [phone] on 06/19/2021 at 3:46 PM
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
## 2021-06-19 NOTE — ED Notes (Signed)
Report Given To  Gwenyth Ober, RN      Descriptive Sentence / Reason for Admission   At internal medicine appointment c/o polydipsia, polyuria. BG checked was >600. Denies any hx of DM.       Active Issues / Relevant Events   A/ox4  Wheelchair bound- quadriplegic  Urostomy   BG: 349  Beta Hydroxybutyrate: 0.39  Tachy to 120s  Tele      To Do List  V/A  Meds per [facility]  BG [facility]      Anticipatory Guidance / Discharge Planning  Admit for diabetes mellitus
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
## 2021-06-19 NOTE — ED Notes (Signed)
Report Given To  Jarrett Soho, RN ([id number])      Descriptive Sentence / Reason for Admission   At internal medicine appointment c/o polydipsia, polyuria. BG checked was >600. Denies any hx of DM.       Active Issues / Relevant Events   A/Ox4  Wheelchair bound- quadriplegic  Urostomy  BG: 349 --> 263  Beta Hydroxybutyrate: 0.39  Tachy to 120s  Tele  UA pending 6/23 (ABX given for UTI)  Covid Neg 6/22      To Do List  V/A  Meds per [facility]  BG [facility]      Anticipatory Guidance / Discharge Planning  Admit for diabetes mellitus
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
## 2021-06-19 NOTE — ED Notes (Signed)
Assumed care of patient. Received report from previous RN. Patient resting in hospital bed, does not appear to be in any distress, chest rising and symmetrical. Bed in low and locked position, call light within reach. Will continue to monitor patient and treat per provider order.

## 2021-06-20 ENCOUNTER — Inpatient Hospital Stay: Payer: Medicaid Other

## 2021-06-20 DIAGNOSIS — R140 Abdominal distension (gaseous): Secondary | ICD-10-CM

## 2021-06-20 DIAGNOSIS — A419 Sepsis, unspecified organism: Secondary | ICD-10-CM

## 2021-06-20 LAB — CBC AND DIFFERENTIAL
Baso # K/uL: 0 10*3/uL (ref 0.0–0.1)
Basophil %: 0.2 %
Eos # K/uL: 0 10*3/uL (ref 0.0–0.4)
Eosinophil %: 0.7 %
Hematocrit: 39 % (ref 34–45)
Hemoglobin: 13.2 g/dL (ref 11.2–15.7)
IMM Granulocytes #: 0 10*3/uL (ref 0.0–0.0)
IMM Granulocytes: 0.2 %
Lymph # K/uL: 1.7 10*3/uL (ref 1.2–3.7)
Lymphocyte %: 32.1 %
MCH: 31 pg (ref 26–32)
MCHC: 34 g/dL (ref 32–36)
MCV: 91 fL (ref 79–95)
Mono # K/uL: 0.6 10*3/uL (ref 0.2–0.9)
Monocyte %: 11.8 %
Neut # K/uL: 3 10*3/uL (ref 1.6–6.1)
Nucl RBC # K/uL: 0 10*3/uL (ref 0.0–0.0)
Nucl RBC %: 0 /100 WBC (ref 0.0–0.2)
Platelets: 83 10*3/uL — ABNORMAL LOW (ref 160–370)
RBC: 4.2 MIL/uL (ref 3.9–5.2)
RDW: 13.2 % (ref 11.7–14.4)
Seg Neut %: 55 %
WBC: 5.4 10*3/uL (ref 4.0–10.0)

## 2021-06-20 LAB — BASIC METABOLIC PANEL
Anion Gap: 12 (ref 7–16)
CO2: 22 mmol/L (ref 20–28)
Calcium: 8.5 mg/dL — ABNORMAL LOW (ref 8.8–10.2)
Chloride: 99 mmol/L (ref 96–108)
Creatinine: 0.35 mg/dL — ABNORMAL LOW (ref 0.51–0.95)
Glucose: 286 mg/dL — ABNORMAL HIGH (ref 60–99)
Lab: 7 mg/dL (ref 6–20)
Potassium: 3.5 mmol/L (ref 3.3–5.1)
Sodium: 133 mmol/L (ref 133–145)
eGFR BY CREAT: 136 *

## 2021-06-20 LAB — POCT GLUCOSE
Glucose POCT: 253 mg/dL — ABNORMAL HIGH (ref 60–99)
Glucose POCT: 250 mg/dL — ABNORMAL HIGH (ref 60–99)
Glucose POCT: 244 mg/dL — ABNORMAL HIGH (ref 60–99)
Glucose POCT: 282 mg/dL — ABNORMAL HIGH (ref 60–99)

## 2021-06-20 LAB — LACTATE, PLASMA: Lactate: 1 mmol/L (ref 0.5–2.2)

## 2021-06-20 MED ORDER — GLUCAGON HCL (RDNA) 1 MG IJ SOLR *WRAPPED*
1.0000 mg | INTRAMUSCULAR | Status: DC | PRN
Start: 2021-06-20 — End: 2021-06-24

## 2021-06-20 MED ORDER — LACTATED RINGERS IV BOLUS *I*
500.0000 mL | Freq: Once | INTRAVENOUS | Status: AC
Start: 2021-06-20 — End: 2021-06-20
  Administered 2021-06-20: 500 mL via INTRAVENOUS

## 2021-06-20 MED ORDER — INSULIN LISPRO (HUMAN) 100 UNIT/ML IJ/SC SOLN *WRAPPED*
0.0000 [IU] | Freq: Every day | SUBCUTANEOUS | Status: DC
Start: 2021-06-20 — End: 2021-06-21
  Administered 2021-06-20: 9 [IU] via SUBCUTANEOUS
  Administered 2021-06-21: 10 [IU] via SUBCUTANEOUS

## 2021-06-20 MED ORDER — GLUCOSE 15 GM/32ML PO GEL *I*
15.0000 g | ORAL | Status: DC | PRN
Start: 2021-06-20 — End: 2021-06-24

## 2021-06-20 MED ORDER — INSULIN LISPRO (HUMAN) 100 UNIT/ML IJ/SC SOLN *WRAPPED*
0.0000 [IU] | Freq: Three times a day (TID) | SUBCUTANEOUS | Status: DC
Start: 2021-06-20 — End: 2021-06-20

## 2021-06-20 MED ORDER — INSULIN GLARGINE 100 UNIT/ML SC SOLN *WRAPPED*
18.0000 [IU] | Freq: Every evening | SUBCUTANEOUS | Status: DC
Start: 2021-06-20 — End: 2021-06-21
  Administered 2021-06-20: 18 [IU] via SUBCUTANEOUS

## 2021-06-20 MED ORDER — SODIUM CHLORIDE 0.9 % IV BOLUS *I*
1000.0000 mL | Freq: Once | Status: AC
Start: 2021-06-20 — End: 2021-06-21

## 2021-06-20 MED ORDER — DEXTROSE 50 % IV SOLN *I*
25.0000 g | INTRAVENOUS | Status: DC | PRN
Start: 2021-06-20 — End: 2021-06-24
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
## 2021-06-20 NOTE — Progress Notes (Addendum)
Inpatient Medicine Progress Note    Interval History:  Sitting up in bed, eating breakfast, and no complaints.    Intake/Output    Intake/Output Summary (Last 24 hours) at 06/20/2021 1309  Last data filed at 06/20/2021 0605  Gross per 24 hour   Intake 410 ml   Output 2400 ml   Net -1990 ml        Vital Signs:   Temp:  [37 C (98.6 F)-38.5 C (101.3 F)] 37 C (98.6 F)  Heart Rate:  [97-129] 97  Resp:  [9-22] 22  BP: (102-171)/(70-111) 102/70     Physical Exam:  General: NAD, pleasant  HEENT: MMM  Cardiovascular: RRR  Pulmonary: CTA   Gastrointestinal: BS, soft, NT  Skin: keloid scar noted on sternum, mult tattoos    Data:    Recent Labs   Lab 06/20/21  0745 06/19/21  0047 06/18/21  1405 06/18/21  1404   WBC 5.4 4.7  --  6.1   Hemoglobin 13.2 13.3 14.3 13.6   Hematocrit 39 41  --  41   Platelets 83* 76*  --  89*     Other Labs:    Recent Labs   Lab 06/20/21  0745 06/19/21  0047 06/18/21  1404   Sodium 133 133 133   Potassium 3.5 CANCELED 3.8   Chloride 99 100 100   CO2 22 20 20    UN 7 15 11    Creatinine 0.35* 0.47* 0.41*   Glucose 286* 409* 461*   Calcium 8.5* 9.5 9.6      X-rays the past 24 hours: EKG 12 lead    Result Date: 06/19/2021  Sinus tachycardia LAE, consider biatrial enlargement Nonspecific T abnrm, anterolateral leads       Assessment:  Ms. Abt is a 35 y.o. 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 ENDO - Hyperglycemia, likely new diagnosis of DM  - bgs 286--> 253 --> 250  - A1c 11.5  - Diabetic educator referral placed - see note/recs    2. HEME - History of ITP  - Fondaparinux for DVT ppx    3. PSYCH - Depression  - Behavioral Health as an outpt    4. GI  - Hemorrhoids  - Hydrocortisone suppository every other day    5. Access  - line placed    6. ID - r/o Sepsis  - on Cefepime  - gentle IVF bolus  - poss urinary source    Claris Gladden, [place]   06/20/2021   1:09 PM     Inpatient checklist  Have Advanced Directives been addressed? Is MOLST in chart and order placed?  Are daily labs needed?  Verify activity orders/encourage mobilization  Remove unnecessary lines (IVs, O2, catheters)  Medication reconcilliation

## 2021-06-20 NOTE — Progress Notes (Signed)
06/20/21 0906   [facility] Patient Class Review   Patient Class Review Inpatient     Patient Class Effective as of 06/18/21.    Lisette Grinder. Darl Householder, BSN  Utilization Management  [phone]

## 2021-06-20 NOTE — Progress Notes (Signed)
Hospital Medicine Progress Note       LOS: 2 days     Subjective: No complaint today, but was frustrated by frequent IV attempts this AM.  Denies nausea/vomiting, but does endorse chills/diaphoresis.  No CP or SOB.  No nausea/vomiting.      Objective:    BP: (102-171)/(70-111)   Temp:  [37 C (98.6 F)-38.5 C (101.3 F)]   Temp src: Temporal (06/24 0835)  Heart Rate:  [97-129]   Resp:  [9-22]   SpO2:  [94 %-98 %]   Height:  [165.1 cm (5\' 5" )]   Weight:  [62.6 kg (138 lb)]     Last set of vitals:  Vitals:    06/20/21 0835   BP: 102/70   Pulse: 97   Resp: 22   Temp: 37 C (98.6 F)   Weight:    Height:          Intake/Output Summary (Last 24 hours) at 06/20/2021 1557  Last data filed at 06/20/2021 9924  Gross per 24 hour   Intake 410 ml   Output 1800 ml   Net -1390 ml       Exam:   General: NAD, sitting up in bed  HEENT: MMM, OP clear  Cardiac: NRR  Resp: CTAB, normal WOB  Abd: Soft, NT/D, normal BS  Ext: + edema, WWP  Neuro: AAO x3      Labs:  Recent Labs   Lab 06/20/21  0745 06/19/21  0047 06/18/21  1404   WBC 5.4 4.7 6.1   Hematocrit 39 41 41   Platelets 83* 76* 89*     Recent Labs   Lab 06/20/21  0745 06/19/21  0047 06/18/21  1404   Sodium 133 133 133   Potassium 3.5 CANCELED 3.8   Chloride 99 100 100   CO2 22 20 20    UN 7 15 11    Creatinine 0.35* 0.47* 0.41*     Recent Labs   Lab 06/18/21  1404   AST 20   ALT 36*     No components found with this basename: LABALBU, LASTTBILI, ALKPHOS        Radiology:  No results found.    Current Meds:  Scheduled Meds:   sodium chloride 0.9 % bolus  1,000 mL Intravenous Once    insulin glargine  18 units Subcutaneous Nightly    insulin lispro  0-20 units Subcutaneous Daily    ceFEPime (MAXIPIME) IV  2,000 mg Intravenous Q12H    acetaminophen  500 mg Oral Once    bisacodyl  10 mg Rectal Every Other Day    hydrocortisone  25 mg Rectal Every Other Day    fondaparinux  2.5 mg Subcutaneous Daily     Continuous Infusions:  PRN Meds:.   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      1.  Hyperglycemia:  -challenge will be her functional status and she doesn't think she'll be able to do a basal/bolus regimen at home.    -Endocrine input appreciated  -increase lantus to 18u qHS  -Start Humalog 6u nightly  -discontinue Metformin  -Diabetes education done    2.  Hx Hemorrhoids:  -cont hydrocortisone suppository    3.  Hx of ITP:  -cont fondaparinux for prophylaxis    4.  Concern for Sepsis:  -fever/tachycardia  -UA suggestive of UTI - await cultures  -will continue Cefepime based on prior cultures.      EMRD:  Possibly 6/26pending ability to control BGs at home and sepsis work-up.        Code Status: Full      Signed by Lynnea Maizes pager [phone] on 06/20/2021 at 3:57 PM

## 2021-06-20 NOTE — ED Procedure Documentation (Signed)
Procedures   Venipuncture provider  Performed by: Lonna Cobb, MD  Authorized by: Lonna Cobb, MD     Consent:     Consent obtained:  Verbal    Consent given by:  Patient  Universal protocol:     Patient identity confirmed:  Verbally with patient and arm band  Indications:     Indications:  Need for IV access  Pre-procedure details:     Skin preparation:  ChloraPrep  Procedure details:     Vein location:  External jugular  Post-procedure details:     Patient tolerance of procedure:  Tolerated well, no immediate complications  Comments:      20 gauge placed right EJ, draws but tenuously and only with drawing skin tension, flushes easily.  Flushed with 10 cc saline and secured        Lonna Cobb, MD     Lonna Cobb, MD  06/20/21 1156
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
## 2021-06-20 NOTE — Progress Notes (Signed)
X-cover:  Initial sepsis note:    Notified by nursing that patient with sepsis alert due to fever and tachycardia. Patient known to me from last night - started on antibiotic for low grade fever and possible UTI.  Overnight with intermittent tachycardia, fever has trended down.    Bedside to see patient. She has some pain from a wound in her neck that drains. She has had it for months and it drains intermittently. She is going to see a specialist for outpatient workup recommendations in the coming weeks. Does not have bladder sensation due to spinal cord injury. Says she sometimes gets symptoms when she is very systemically ill. She does not feel like that now.   Does note constipation over last 5 days.  No dyspnea nor cough.    Gen: WDWN female NAD pleasant  Cor: tachy, regular  Pulm: CTAB  Abd: moderate distension. BS+. Some uncomfortable pressure with deep palpation. No rebound nor guarding  Ext: warm, no edema    Does have fever and tachycardia. Has history of autonomic dysfunction which complicates interpretation. Overall well-appearing.    Sepsis  -already started on cefepime for presumed UTI  -check BCx and lactate  -check CBC and BMP  -bolus 1L fluid now. Follow heart rate  -check KUB for abdominal distension, if concerning would follow-up with CT scan. May present atypically with obstruction as she lacks sensation through abdomen due to spinal cord injury  -monitor for BM    Mariane Duval, PA  5:55 AM

## 2021-06-20 NOTE — Progress Notes (Signed)
Report Given To  Haynes Dage, RN      Descriptive Sentence / Reason for Admission   CC: Hyperglycemia, new onset DM2  HX: Quadriplegia, autonomic dysfunction, neurogenic bowel and bladder, chronic thrombocytopenia, IVC filter,   FULL CODE        Active Issues / Relevant Events   Family member contact and phone # =  pin # =   *A&Ox3, med whole, high fall risk*  Cam screen: negative  Pain  Ambulatory status: wheelchair   O2: none    6/24 ON: low grade temp, tylenol x1, tachy on tele in 120-130s, provider notified about sepsis BPA and protocol initiated, unable to get blood cultures, CRN came and attempted x1, was able to get one set in foot and PICC team to place new IV and second set of blood cultures.         To Do List

## 2021-06-20 NOTE — Consults (Signed)
Certified Diabetes Care and Education Specialist Note    Returned for review, as requested in Endocrinology note.      Reviewed with patient:  target blood sugars, basal vs bolus insulin, signs and symptoms of hypoglycemia and appropriate treatment and when to call MD for low or high BG. Written information was provided for all of the above.    Provider ordered a Freestyle Freedom Lite glucose meter.  Discussed testing blood sugars 1-2 times daily (before dinner when she will be taking insulin, and possibly in the morning if anyone is able to help her) and reviewed target blood sugars.   Patient was unable to practice, but showed her a demonstration.  Patient reports that her son will be checking her BG.      Reviewed Lantus and Humalog doses and administration with insulin pens.  Verbalizes understanding of Lantus and Humalog doses.  Patient was able to use a correction scale with eating and not eating doses.     Please add insulin doses to discharge instructions using these steps:  1. Type home insulin into the SmartText box at the top of the patients discharge instructions portion of their After Visit Summary     2. Press accept button    3. Press F2 to open up the various insulin scales and select your choice    4. Press F2 to fill in the doses     Reviewed sources of carbohydrate and the MyPlate method for portion control. Written information was provided for all of the above.    Patient listened and participated in the discussion.  Did not attempt hands-on education.  Patient reports that her son will be helping her with checking BG and giving insulin.  Her son is 63 years old. She plans to have her mother help her right after discharge, as her mother has diabetes and uses a glucose meter and insulin pens. Reiterated the plan for one injection of long-acting and one injection of fast-acting insulin daily, given before dinner.     Patient will need these items at discharge:     Use Insulin & Diabetes  Supplies Order Set     Humalog or Admelog pens    Lantus, Basaglar or Semglee pens     Insulin pen needles 4 mm   Alcohol swab pads     Royann Shivers, [place], [place]

## 2021-06-21 LAB — POCT GLUCOSE
Glucose POCT: 270 mg/dL — ABNORMAL HIGH (ref 60–99)
Glucose POCT: 276 mg/dL — ABNORMAL HIGH (ref 60–99)
Glucose POCT: 222 mg/dL — ABNORMAL HIGH (ref 60–99)
Glucose POCT: 298 mg/dL — ABNORMAL HIGH (ref 60–99)
Glucose POCT: 318 mg/dL — ABNORMAL HIGH (ref 60–99)

## 2021-06-21 LAB — CBC AND DIFFERENTIAL
Baso # K/uL: 0 10*3/uL (ref 0.0–0.1)
Basophil %: 0.4 %
Eos # K/uL: 0 10*3/uL (ref 0.0–0.4)
Eosinophil %: 0.6 %
Hematocrit: 39 % (ref 34–45)
Hemoglobin: 12.8 g/dL (ref 11.2–15.7)
IMM Granulocytes #: 0 10*3/uL (ref 0.0–0.0)
IMM Granulocytes: 0.2 %
Lymph # K/uL: 2.2 10*3/uL (ref 1.2–3.7)
Lymphocyte %: 45.5 %
MCH: 30 pg (ref 26–32)
MCHC: 33 g/dL (ref 32–36)
MCV: 92 fL (ref 79–95)
Mono # K/uL: 0.7 10*3/uL (ref 0.2–0.9)
Monocyte %: 13.9 %
Neut # K/uL: 1.9 10*3/uL (ref 1.6–6.1)
Nucl RBC # K/uL: 0 10*3/uL (ref 0.0–0.0)
Nucl RBC %: 0 /100 WBC (ref 0.0–0.2)
Platelets: 84 10*3/uL — ABNORMAL LOW (ref 160–370)
RBC: 4.3 MIL/uL (ref 3.9–5.2)
RDW: 13.1 % (ref 11.7–14.4)
Seg Neut %: 39.4 %
WBC: 4.9 10*3/uL (ref 4.0–10.0)

## 2021-06-21 LAB — APTT: aPTT: 33.4 s (ref 25.8–37.9)

## 2021-06-21 LAB — PROTIME-INR
INR: 1.1 (ref 0.9–1.1)
Protime: 12.7 s (ref 10.0–12.9)

## 2021-06-21 LAB — HCT AND HGB
Hematocrit: 40 % (ref 34–45)
Hemoglobin: 12.8 g/dL (ref 11.2–15.7)

## 2021-06-21 LAB — BASIC METABOLIC PANEL
Anion Gap: 15 (ref 7–16)
CO2: 20 mmol/L (ref 20–28)
Calcium: 8.7 mg/dL — ABNORMAL LOW (ref 8.8–10.2)
Chloride: 102 mmol/L (ref 96–108)
Creatinine: 0.3 mg/dL — ABNORMAL LOW (ref 0.51–0.95)
Glucose: 242 mg/dL — ABNORMAL HIGH (ref 60–99)
Lab: 8 mg/dL (ref 6–20)
Potassium: 3.4 mmol/L (ref 3.3–5.1)
Sodium: 137 mmol/L (ref 133–145)
eGFR BY CREAT: 141 *

## 2021-06-21 LAB — TYPE AND SCREEN
ABO RH Blood Type: O POS
Antibody Screen: NEGATIVE

## 2021-06-21 LAB — AEROBIC CULTURE: Aerobic Culture: 0

## 2021-06-21 LAB — ISLET CELL AB: Islet Cell Ab: <1:4 {titer}

## 2021-06-21 LAB — MCHC: MCHC: 32 g/dL (ref 32–36)

## 2021-06-21 MED ORDER — INSULIN LISPRO (HUMAN) 100 UNIT/ML IJ/SC SOLN *WRAPPED*
0.0000 [IU] | Freq: Every day | SUBCUTANEOUS | Status: DC
Start: 2021-06-21 — End: 2021-06-22
  Administered 2021-06-21: 12 [IU] via SUBCUTANEOUS

## 2021-06-21 MED ORDER — INSULIN GLARGINE 100 UNIT/ML SC SOLN *WRAPPED*
22.0000 [IU] | Freq: Every evening | SUBCUTANEOUS | Status: DC
Start: 2021-06-21 — End: 2021-06-22
  Administered 2021-06-21: 22 [IU] via SUBCUTANEOUS

## 2021-06-21 MED ORDER — SENNOSIDES 8.6 MG PO TABS *I*
2.0000 | ORAL_TABLET | Freq: Every evening | ORAL | Status: DC
Start: 2021-06-21 — End: 2021-06-24
  Administered 2021-06-21 – 2021-06-23 (×3): 2 via ORAL
  Filled 2021-06-21 (×3): qty 2

## 2021-06-21 MED ORDER — POLYETHYLENE GLYCOL 3350 PO PACK 17 GM *I*
17.0000 g | PACK | Freq: Two times a day (BID) | ORAL | Status: DC
Start: 2021-06-21 — End: 2021-06-24
  Administered 2021-06-23: 17 g via ORAL
  Filled 2021-06-21 (×4): qty 17

## 2021-06-21 MED ORDER — POLYETHYLENE GLYCOL 3350 PO PACK 17 GM *I*
17.0000 g | PACK | Freq: Every day | ORAL | Status: DC
Start: 2021-06-22 — End: 2021-06-21

## 2021-06-21 MED ORDER — INSULIN LISPRO (HUMAN) 100 UNIT/ML IJ/SC SOLN *WRAPPED*
0.0000 [IU] | Freq: Every day | SUBCUTANEOUS | Status: DC
Start: 2021-06-22 — End: 2021-06-21

## 2021-06-21 NOTE — Progress Notes (Addendum)
Hospital Medicine APP Progress Note:   Patient seen & chart reviewed    ADDENDUM:  - Pt with blood clots and frank blood after suppository  - Hx of hemorrhoids, high suspicion this is the etiology of blood loss   - Check H/H now and Q8h, check INR T&S, and APTT  - Verbal consent for blood, will put in chart     Cindy Dingwall, NP at 12:56 PM on 06/21/2021    Subjective:   Denies acute concerns, feels okay  Was feeling bloated and received a suppository from nursing   Last BM 6/20    Vitals:    06/20/21 0835 06/20/21 1659 06/20/21 2305 06/21/21 0259   BP: 102/70 100/64 (!) 152/107 109/81   BP Location: Right arm Left arm     Pulse: 97 (!) 112 (!) 115 106   Resp: 22 12 18 16    Temp: 37 C (98.6 F) 36.3 C (97.3 F) 35.7 C (96.3 F) 35.2 C (95.4 F)   TempSrc: Temporal Temporal     SpO2: 94% 96% 100% 95%   Weight:       Height:         Objective:   Neuro: awake, alert, oriented x3  General: no signs of acute distress   Lungs: unlabored  Heart: no LE edema  Abd: mildly distended, last BM 6/20    Labs: reviewed    Recent Labs   Lab 06/20/21  0745 06/19/21  0047 06/18/21  1405 06/18/21  1404   WBC 5.4 4.7  --  6.1   Hemoglobin 13.2 13.3 14.3 13.6   Hematocrit 39 41  --  41   Platelets 83* 76*  --  89*         Lab results: 06/20/21  0745 06/19/21  0047 06/18/21  1404 04/16/21  1036 09/27/20  0553 09/26/20  0614 09/25/20  0157   Sodium 133 133 133  --  141 141 143   Potassium 3.5 CANCELED 3.8  --  3.6 3.5 3.6   Chloride 99 100 100  --  104 102 102   CO2 22 20 20   --  26 25 25    UN 7 15 11   --  15 15 12    Creatinine 0.35* 0.47* 0.41*   < > 0.46* 0.49* 0.51   GFR,Caucasian  --   --   --   --  129 126 125   GFR,Black  --   --   --   --  149 146 144   Glucose 286* 409* 461*  --  106* 101* 100*   Calcium 8.5* 9.5 9.6   < > 9.2 9.3 9.6    < > = values in this interval not displayed.       No results for input(s): INR in the last 168 hours.  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  Sepsis  - Unclear etiology though UA suggest UTI  - Follow urine cultures  - Continue Cefepime for now     Hyperglycemia new diabetes mellitus diagnosis  - Endocrine following, awaiting further testing to determine if she has DMI or DMII   - Will need outpt follow up  - BGs remain higher than goal   - On Lantus 18u + ISS 6u with correction nightly --> per endo, increase lantus to 22u and ISS nb to 8u  - Endo now recommends Metformin 500mg  BID at time of d/c    Hx of ITP, Chronic Thrombocytopenia   - Continue Fondaparinux     DVT prophylaxis: Fondaparinux  Dispo: TBD. Pending BGs and bowel movement    Cindy Dingwall, NP  7:32 AM  06/21/2021    Scheduled Meds:   insulin glargine  18 units Subcutaneous Nightly    insulin lispro  0-20 units Subcutaneous Daily    ceFEPime (MAXIPIME) IV  2,000 mg Intravenous Q12H    bisacodyl  10 mg Rectal Every Other Day    hydrocortisone  25 mg Rectal Every Other Day    fondaparinux  2.5 mg Subcutaneous Daily

## 2021-06-21 NOTE — Progress Notes (Signed)
Report Given To  [facility] RN      Descriptive Sentence / Reason for Admission   CC: Hyperglycemia, new onset DM2  HX: Quadriplegia, autonomic dysfunction, neurogenic bowel and bladder, chronic thrombocytopenia, IVC filter,   FULL CODE        Active Issues / Relevant Events   Family member contact and phone # =  pin # =   *A&Ox3, med whole, high fall risk*  Cam screen: negative  Pain  Ambulatory status: wheelchair   O2: none    6/24 ON: low grade temp, tylenol x1, tachy on tele in 120-130s, provider notified about sepsis BPA and protocol initiated, unable to get blood cultures, CRN came and attempted x1, was able to get one set in foot and PICC team to place new IV and second set of blood cultures.   6/24D: given 558ml bolus, cefepime. IV access in neck. Urostomy wafer changed. 1 3/4 wafer and high output bag, uses barrier ring at home.   6/25 overnight- refused labs unless they were in her foot, notified PA Nate Maggio and said to cancel labs for this morning. Refusing to be turned at times. Abdomen is distended a KUB ordered and showed moderate stool.        To Do List          Anticipatory Guidance / Discharge Planning  *glasses, dentures, hearing aides*   2 identified visitors:  Where from:  Placement:  Transportation home:  Discharge date:   LOS:

## 2021-06-21 NOTE — Progress Notes (Signed)
Provider paged to bedside for blood in stool. There was a small amount of clotted blood and mucous in stool . Stool was soft and brown.

## 2021-06-21 NOTE — Progress Notes (Signed)
X-cover:  Notified by nursing that patient wanted to have EJ PIV removed due to pain.  Chart reviewed, was just placed today or yesterday. Had BRBPR.  Advised to give tylenol and encourage patient to keep 2 PIV.    Notified by nursing that patient threatening to self-remove PIV.  Bedside to see patient. Frustrated with her hospital stay, feels she is being lied to. Discussed rationale for 2 PIV. Still wants it removed.    Removed EJ PIV.    Patient stated the she was going to sign out AMA tomorrow morning. I asked what time so I could alert the team tomorrow in advance. She is open to leaving later tomorrow morning after rounds. She is working on resuming aide services and transportation on her own.    Mariane Duval, PA  8:29 PM
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
## 2021-06-21 NOTE — Progress Notes (Signed)
Report Given To        Descriptive Sentence / Reason for Admission   CC: Hyperglycemia, new onset DM2  HX: Quadriplegia, autonomic dysfunction, neurogenic bowel and bladder, chronic thrombocytopenia, IVC filter,   FULL CODE        Active Issues / Relevant Events   Family member contact and phone # =  pin # =   *A&Ox3, med whole, high fall risk*  Cam screen: negative  Pain  Ambulatory status: wheelchair   O2: none    6/24 ON: low grade temp, tylenol x1, tachy on tele in 120-130s, provider notified about sepsis BPA and protocol initiated, unable to get blood cultures, CRN came and attempted x1, was able to get one set in foot and PICC team to place new IV and second set of blood cultures.   6/24D: given 580ml bolus, cefepime. IV access in neck. Urostomy wafer changed. 1 3/4 wafer and high output bag, uses barrier ring at home.   6/25 overnight- refused labs unless they were in her foot, notified PA Nate Maggio and said to cancel labs for this morning. Refusing to be turned at times. Abdomen is distended a KUB ordered and showed moderate stool.        To Do List          Anticipatory Guidance / Discharge Planning  *glasses, dentures, hearing aides*   2 identified visitors:  Where from:  Placement:  Transportation home:  Discharge date:   LOS:

## 2021-06-21 NOTE — Progress Notes (Signed)
Pt refused enema

## 2021-06-21 NOTE — Progress Notes (Addendum)
General Daily SOAP Progress Note for Inpatients   LOS: 3 days       Subjective     Patient is feeling well. No new complaints.    Vitals: Blood pressure 109/81, pulse 106, temperature 35.2 C (95.4 F), resp. rate 16, height 1.651 m (5\' 5" ), weight 62.6 kg (138 lb), SpO2 95 %.   Vital-Signs Ranges: Temp:  [35.2 C (95.4 F)-37 C (98.6 F)] 35.2 C (95.4 F)  Heart Rate:  [97-115] 106  Resp:  [12-22] 16  BP: (100-152)/(64-107) 109/81           Intake/Output last 3 shifts:  I/O last 3 completed shifts:  06/24 0700 - 06/25 0659  In: - (0 mL/kg)   Out: 2600 (41.5 mL/kg) [Urine:2600 (1.7 mL/kg/hr)]  Net: -2600  Weight: 62.6 kg   Intake/Output this shift:  No intake/output data recorded.    Nursing pain score: Last Nursing documented pain:  0-10 Scale: 0 (06/21/21 0259)      Objective    Lungs clear to auscultation  CVS heart sounds normal  Abd soft, non tender BS positive  extr warm, no edema  CNS quadriplegia, alert and oriented    Lab Results:   Recent Labs   Lab 06/20/21  0745 06/19/21  0047 06/18/21  1405 06/18/21  1404   WBC 5.4 4.7  --  6.1   Hemoglobin 13.2 13.3 14.3 13.6   Hematocrit 39 41  --  41   Platelets 83* 76*  --  89*     Recent Labs   Lab 06/20/21  0745 06/19/21  0047 06/18/21  1404   Sodium 133 133 133   Potassium 3.5 CANCELED 3.8   CO2 22 20 20    UN 7 15 11    Creatinine 0.35* 0.47* 0.41*   Glucose 286* 409* 461*   Calcium 8.5* 9.5 9.6     Bllod cx 06/21/21- NGTD  Urine cx pending.    Radiology impressions (last 3 days):  EKG 12 lead    Result Date: 06/19/2021  Sinus tachycardia LAE, consider biatrial enlargement Nonspecific T abnrm, anterolateral leads    * Abdomen standard AP single view/KUB    Result Date: 06/20/2021  Unremarkable abdominal series. END OF IMPRESSION [facility] submits this DICOM format image data and final report to the [facility], an independent secure electronic health information exchange, on a reciprocally searchable basis (with patient authorization) for a minimum of 12 months  after exam date.      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     1. Hyperglycemia:  BGs are 270 and 282.  -challenge will be her functional status and she doesn't think she'll be able to do a basal/bolus regimen at home.   -Endocrine input appreciated  -increase lantus to 22u nightly  -increase  Humalog 8u nightly  -Per endo restart Metformin at discharge      2. Hx Hemorrhoids:  -cont hydrocortisone suppository    3. Hx of ITP:  -cont fondaparinux for prophylaxis    4.  Concern for Sepsis:  -fever/tachycardia  -UA suggestive of UTI - cultures pending.  -will continue Cefepime based on prior cultures.  EMRD: Possibly 6/26pending ability to control BGs at home and sepsis work-up.        Code Status:Full    Dispo: Home with services when stable.    Author: Judd Lien, MD  as of: 06/21/2021  at: 7:53 AM

## 2021-06-22 DIAGNOSIS — E119 Type 2 diabetes mellitus without complications: Secondary | ICD-10-CM

## 2021-06-22 LAB — CBC AND DIFFERENTIAL
Baso # K/uL: 0 10*3/uL (ref 0.0–0.1)
Basophil %: 0.4 %
Eos # K/uL: 0 10*3/uL (ref 0.0–0.4)
Eosinophil %: 0.8 %
Hematocrit: 38 % (ref 34–45)
Hemoglobin: 12.3 g/dL (ref 11.2–15.7)
IMM Granulocytes #: 0 10*3/uL (ref 0.0–0.0)
IMM Granulocytes: 0.2 %
Lymph # K/uL: 2.6 10*3/uL (ref 1.2–3.7)
Lymphocyte %: 48.6 %
MCH: 30 pg (ref 26–32)
MCHC: 32 g/dL (ref 32–36)
MCV: 92 fL (ref 79–95)
Mono # K/uL: 0.6 10*3/uL (ref 0.2–0.9)
Monocyte %: 12 %
Neut # K/uL: 2 10*3/uL (ref 1.6–6.1)
Nucl RBC # K/uL: 0 10*3/uL (ref 0.0–0.0)
Nucl RBC %: 0 /100 WBC (ref 0.0–0.2)
Platelets: 83 10*3/uL — ABNORMAL LOW (ref 160–370)
RBC: 4.2 MIL/uL (ref 3.9–5.2)
RDW: 13.1 % (ref 11.7–14.4)
Seg Neut %: 38 %
WBC: 5.3 10*3/uL (ref 4.0–10.0)

## 2021-06-22 LAB — BASIC METABOLIC PANEL
Anion Gap: 12 (ref 7–16)
CO2: 22 mmol/L (ref 20–28)
Calcium: 8.9 mg/dL (ref 8.8–10.2)
Chloride: 101 mmol/L (ref 96–108)
Creatinine: 0.36 mg/dL — ABNORMAL LOW (ref 0.51–0.95)
Glucose: 258 mg/dL — ABNORMAL HIGH (ref 60–99)
Lab: 8 mg/dL (ref 6–20)
Potassium: 3.3 mmol/L (ref 3.3–5.1)
Sodium: 135 mmol/L (ref 133–145)
eGFR BY CREAT: 135 *

## 2021-06-22 LAB — POCT GLUCOSE
Glucose POCT: 208 mg/dL — ABNORMAL HIGH (ref 60–99)
Glucose POCT: 253 mg/dL — ABNORMAL HIGH (ref 60–99)
Glucose POCT: 231 mg/dL — ABNORMAL HIGH (ref 60–99)
Glucose POCT: 186 mg/dL — ABNORMAL HIGH (ref 60–99)
Glucose POCT: 143 mg/dL — ABNORMAL HIGH (ref 60–99)

## 2021-06-22 LAB — HCT AND HGB
Hematocrit: 39 % (ref 34–45)
Hemoglobin: 12.5 g/dL (ref 11.2–15.7)

## 2021-06-22 LAB — GLUTAMIC ACID DECARBOXYLASE: Glutamic Acid Decarboxlase: <5 IU/mL (ref 0.0–5.0)

## 2021-06-22 LAB — MCHC: MCHC: 33 g/dL (ref 32–36)

## 2021-06-22 MED ORDER — METFORMIN HCL 500 MG PO TABS *I*
500.0000 mg | ORAL_TABLET | Freq: Two times a day (BID) | ORAL | Status: DC
Start: 2021-06-22 — End: 2021-06-23
  Administered 2021-06-22 – 2021-06-23 (×2): 500 mg via ORAL
  Filled 2021-06-22 (×3): qty 1

## 2021-06-22 MED ORDER — INSULIN LISPRO (HUMAN) 100 UNIT/ML IJ/SC SOLN *WRAPPED*
0.0000 [IU] | Freq: Every day | SUBCUTANEOUS | Status: DC
Start: 2021-06-22 — End: 2021-06-24
  Administered 2021-06-22 – 2021-06-23 (×2): 12 [IU] via SUBCUTANEOUS

## 2021-06-22 MED ORDER — INSULIN GLARGINE 100 UNIT/ML SC SOLN *WRAPPED*
25.0000 [IU] | Freq: Every evening | SUBCUTANEOUS | Status: DC
Start: 2021-06-22 — End: 2021-06-24
  Administered 2021-06-22 – 2021-06-23 (×2): 25 [IU] via SUBCUTANEOUS

## 2021-06-22 NOTE — Progress Notes (Signed)
General Daily SOAP Progress Note for Inpatients   LOS: 4 days       Subjective     Patient is feeling better. Had BM yesterday. Had some blood clots with BM yesterday. Wanted to leave AMA today but then decided to stay until tomorrow.    Vitals: Blood pressure 114/82, pulse 82, temperature 35.4 C (95.7 F), temperature source Temporal, resp. rate 16, height 1.651 m (5\' 5" ), weight 62.6 kg (138 lb), SpO2 97 %.   Vital-Signs Ranges: Temp:  [35.4 C (95.7 F)-36.9 C (98.4 F)] 35.4 C (95.7 F)  Heart Rate:  [82-118] 82  Resp:  [16-22] 16  BP: (114-200)/(70-168) 114/82           Intake/Output last 3 shifts:  I/O last 3 completed shifts:  06/25 1500 - 06/26 1459  In: 480 (7.7 mL/kg) [P.O.:480]  Out: 2500 (39.9 mL/kg) [Urine:2500 (1.7 mL/kg/hr)]  Net: -2020  Weight: 62.6 kg   Intake/Output this shift:  No intake/output data recorded.    Nursing pain score: Last Nursing documented pain:  0-10 Scale: 0 (06/22/21 1200)      Objective    Lungs clear to auscultation  CVS heart sounds normal  Abd soft, non tender BS positive  extr warm, no edema  CNS awake, alert, s/p quadriplegia.    Lab Results:   Recent Labs   Lab 06/22/21  0422 06/21/21  2213 06/21/21  1316 06/20/21  0745   WBC 5.3  --  4.9 5.4   Hemoglobin 12.3 12.8 12.8 13.2   Hematocrit 38 40 39 39   Platelets 83*  --  84* 83*     Recent Labs   Lab 06/22/21  0422 06/21/21  1316 06/20/21  0745   Sodium 135 137 133   Potassium 3.3 3.4 3.5   CO2 22 20 22    UN 8 8 7    Creatinine 0.36* 0.30* 0.35*   Glucose 258* 242* 286*   Calcium 8.9 8.7* 8.5*     Urine cx : Mixed flora suggest contamination  Blood cx: NG    Radiology impressions (last 3 days):  * Abdomen standard AP single view/KUB    Result Date: 06/20/2021  Unremarkable abdominal series. END OF IMPRESSION [facility] submits this DICOM format image data and final report to the [facility], an independent secure electronic health information exchange, on a reciprocally searchable basis (with patient authorization) for  a minimum of 12 months after exam date.      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     1. Hyperglycemia:  BGs are 253 and 208.  she doesn't think she'll be able to do a basal/bolus regimen at home.   -increase lantus to 25u nightly  -increase  Humalog 10u nightly  -Per endo restart Metformin .  Follow with endocrine.    2. Hx Hemorrhoids:  Blood clots likely due to hemorrhoids.  Will cont miralax for stool softener.  cont hydrocortisone suppository  Follow hct in am.    3. Hx of ITP:  cont fondaparinux for prophylaxis    4. Concern for Sepsis  No further fever  Resolving sepsis.  Urine cx suggest contamination  Blood cx are  negative.  Will d/c cefipime at discharge, since patient plans to leave tomorrow. She would have received 3 days of treatment by tomorrow. Ideal treatment  is 5-7 days for complicated UTI.    EMRD: Possibly 6/26 pending ability to control BGs at homeand sepsis work-up.    Code Status:Full    Dispo: Home with services when stable.      Author: Judd Lien, MD  as of: 06/22/2021  at: 3:36 PM
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
## 2021-06-22 NOTE — Progress Notes (Addendum)
Hospital Medicine APP Progress Note:   Patient seen & chart reviewed    Subjective:   Denies acute concerns, feels okay    Vitals:    06/21/21 1650 06/21/21 2203 06/21/21 2346 06/22/21 0815   BP: (!) 144/102 130/70 (!) 187/120 114/82   BP Location: Left arm  Left arm Left arm   Pulse: (!) 118 102 106 82   Resp: 20 18 20 16    Temp: 36.9 C (98.4 F) 36.1 C (97 F) 36.6 C (97.9 F) 35.4 C (95.7 F)   TempSrc: Axillary  Temporal Temporal   SpO2:  98% 96% 97%   Weight:       Height:         Objective:   Neuro: awake, alert, oriented x3  General: no signs of acute distress   Lungs: unlabored  Heart: no LE edema  Abd: mildly distended    Labs: reviewed    Recent Labs   Lab 06/22/21  0422 06/21/21  2213 06/21/21  1316 06/20/21  0745   WBC 5.3  --  4.9 5.4   Hemoglobin 12.3 12.8 12.8 13.2   Hematocrit 38 40 39 39   Platelets 83*  --  84* 83*         Lab results: 06/22/21  0422 06/21/21  1316 06/20/21  0745 04/16/21  1036 09/27/20  0553 09/26/20  0614 09/25/20  0157   Sodium 135 137 133   < > 141 141 143   Potassium 3.3 3.4 3.5   < > 3.6 3.5 3.6   Chloride 101 102 99   < > 104 102 102   CO2 22 20 22    < > 26 25 25    UN 8 8 7    < > 15 15 12    Creatinine 0.36* 0.30* 0.35*   < > 0.46* 0.49* 0.51   GFR,Caucasian  --   --   --   --  129 126 125   GFR,Black  --   --   --   --  149 146 144   Glucose 258* 242* 286*   < > 106* 101* 100*   Calcium 8.9 8.7* 8.5*   < > 9.2 9.3 9.6    < > = values in this interval not displayed.       Recent Labs   Lab 06/21/21  1316   INR 1.1     No results for input(s): AST, ALT, HTBIL, TB, DB in the last 72 hours.    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      Sepsis  - Unclear etiology though UA suggest UTI  - Urine culture finalized, mixed flora concern for contamination   - Continue Cefepime for now, day 3. May be reasonable to stop at day 5 vs switch to PO abx that will treat prior uropathogens     BRBPR  - H/H has been stable  - Exam consistent with hemorrhoid etiology  - Check H/H BID for now, T&S up to date, consented    Constipation with known  hemorrhoids   - KUB from 6/24 shows moderate stool burden  - Continue PR bisacodyl every other day   - Continue Miralax BID and Senna nightly  - Give TWE today      Hyperglycemia new diabetes mellitus diagnosis  - Endocrine following, awaiting further testing to determine if she has DMI or DMII   - Will need outpt follow up  - BGs remain higher than goal   - On Lantus 18u + ISS 6u with correction nightly --> per endo, increase lantus to 25u and ISS nb to 10u  - Endo now recommends Metformin 500mg  BID at time of d/c, start now per attending    Hx of ITP, Chronic Thrombocytopenia   - Continue Fondaparinux     DVT prophylaxis: Fondaparinux  Dispo: TBD. Pending BGs and bowel movement    Harland Dingwall, NP  10:41 AM  06/22/2021    Scheduled Meds:   insulin glargine  25 units Subcutaneous Nightly    insulin lispro  0-20 units Subcutaneous Daily    metFORMIN  500 mg Oral BID WC    polyethylene glycol  17 g Oral 2 times per day    senna  2 tablet Oral Nightly    ceFEPime (MAXIPIME) IV  2,000 mg Intravenous Q12H    bisacodyl  10 mg Rectal Every Other Day    hydrocortisone  25 mg Rectal Every Other Day

## 2021-06-22 NOTE — Progress Notes (Signed)
Report Given To  [facility] RN      Descriptive Sentence / Reason for Admission   CC: Hyperglycemia, new onset DM2  HX: Quadriplegia, autonomic dysfunction, neurogenic bowel and bladder, chronic thrombocytopenia, IVC filter,   FULL CODE        Active Issues / Relevant Events   Family member contact and phone # =  pin # =   *A&Ox3, med whole, high fall risk*  Cam screen: negative  Pain  Ambulatory status: wheelchair   O2: none    6/24 ON: low grade temp, tylenol x1, tachy on tele in 120-130s, provider notified about sepsis BPA and protocol initiated, unable to get blood cultures, CRN came and attempted x1, was able to get one set in foot and PICC team to place new IV and second set of blood cultures.   6/24D: given 535ml bolus, cefepime. IV access in neck. Urostomy wafer changed. 1 3/4 wafer and high output bag, uses barrier ring at home.   6/25 overnight- refused labs unless they were in her foot, notified PA Nate Maggio and said to cancel labs for this morning. Refusing to be turned at times. Abdomen is distended a KUB ordered and showed moderate stool.    5/25D: IV put in L wrist.   6-26 Overnight-Still receiving IV abx. Has Left wrist IV patent , dry and intact. Good urine output. No c/o pain. Extra large BM with Moderat amount of mucousy blood notified Nate Maggio PA and ordered H&H for now which came back normal.        To Do List          Anticipatory Guidance / Discharge Planning  *glasses, dentures, hearing aides*   2 identified visitors:  Where from:  Placement:  Transportation home:  Discharge date:   LOS:

## 2021-06-23 LAB — BASIC METABOLIC PANEL
Anion Gap: 11 (ref 7–16)
CO2: 22 mmol/L (ref 20–28)
Calcium: 9.6 mg/dL (ref 8.8–10.2)
Chloride: 102 mmol/L (ref 96–108)
Creatinine: 0.38 mg/dL — ABNORMAL LOW (ref 0.51–0.95)
Glucose: 220 mg/dL — ABNORMAL HIGH (ref 60–99)
Lab: 13 mg/dL (ref 6–20)
Potassium: 3.5 mmol/L (ref 3.3–5.1)
Sodium: 135 mmol/L (ref 133–145)
eGFR BY CREAT: 133 *

## 2021-06-23 LAB — POCT GLUCOSE
Glucose POCT: 145 mg/dL — ABNORMAL HIGH (ref 60–99)
Glucose POCT: 192 mg/dL — ABNORMAL HIGH (ref 60–99)
Glucose POCT: 167 mg/dL — ABNORMAL HIGH (ref 60–99)
Glucose POCT: 201 mg/dL — ABNORMAL HIGH (ref 60–99)
Glucose POCT: 195 mg/dL — ABNORMAL HIGH (ref 60–99)

## 2021-06-23 LAB — CBC AND DIFFERENTIAL
Baso # K/uL: 0 10*3/uL (ref 0.0–0.1)
Basophil %: 0 %
Eos # K/uL: 0.1 10*3/uL (ref 0.0–0.4)
Eosinophil %: 2 %
Hematocrit: 38 % (ref 34–45)
Hemoglobin: 12.3 g/dL (ref 11.2–15.7)
Lymph # K/uL: 2.7 10*3/uL (ref 1.2–3.7)
Lymphocyte %: 47 %
MCH: 30 pg (ref 26–32)
MCHC: 33 g/dL (ref 32–36)
MCV: 92 fL (ref 79–95)
Mono # K/uL: 0.3 10*3/uL (ref 0.2–0.9)
Monocyte %: 6 %
Neut # K/uL: 2.2 10*3/uL (ref 1.6–6.1)
Nucl RBC # K/uL: 0 10*3/uL (ref 0.0–0.0)
Nucl RBC %: 0 /100 WBC (ref 0.0–0.2)
Platelets: 94 10*3/uL — ABNORMAL LOW (ref 160–370)
RBC: 4.1 MIL/uL (ref 3.9–5.2)
RDW: 13 % (ref 11.7–14.4)
Seg Neut %: 42 %
WBC: 5.3 10*3/uL (ref 4.0–10.0)

## 2021-06-23 LAB — MCHC: MCHC: 33 g/dL (ref 32–36)

## 2021-06-23 LAB — HCT AND HGB
Hematocrit: 43 % (ref 34–45)
Hemoglobin: 14.1 g/dL (ref 11.2–15.7)

## 2021-06-23 LAB — DIFF MANUAL
Diff Based On: 100 CELLS
React Lymph %: 3 % (ref 0–6)

## 2021-06-23 NOTE — Progress Notes (Signed)
Report Given To  [facility] RN      Descriptive Sentence / Reason for Admission   CC: Hyperglycemia, new onset DM2  HX: Quadriplegia, autonomic dysfunction, neurogenic bowel and bladder, chronic thrombocytopenia, IVC filter,   FULL CODE        Active Issues / Relevant Events   Family member contact and phone # =  pin # =   *A&Ox3, med whole, high fall risk*  Cam screen: negative  Pain  Ambulatory status: wheelchair   O2: none    6/24 ON: low grade temp, tylenol x1, tachy on tele in 120-130s, provider notified about sepsis BPA and protocol initiated, unable to get blood cultures, CRN came and attempted x1, was able to get one set in foot and PICC team to place new IV and second set of blood cultures.   6/24D: given 55ml bolus, cefepime. IV access in neck. Urostomy wafer changed. 1 3/4 wafer and high output bag, uses barrier ring at home.   6/25 overnight- refused labs unless they were in her foot, notified PA Nate Maggio and said to cancel labs for this morning. Refusing to be turned at times. Abdomen is distended a KUB ordered and showed moderate stool.    5/25D: IV put in L wrist.   6-26 Overnight-Still receiving IV abx. Has Left wrist IV patent , dry and intact. Good urine output. No c/o pain. Extra large BM with Moderat amount of mucousy blood notified Nate Maggio PA and ordered H&H for now which came back normal.  6/27 D: NAE, metformin D/Cd, son to come in for diabetes education tomorrow 11am per patient, cbc now daily, hh 14.1 43        To Do List          Quentin Ore, RN

## 2021-06-23 NOTE — Progress Notes (Addendum)
Covering for [facility] [place]    Per chart review:      Sepsis, BRBPR   New DM diagnosis, has appointment in place with endocrine on 7/1   Set up PCP appointment.    Needs outpatient gyn appointment, already has appointment in place on 7/13    Please call, page, web chat with any discharge needs     Irene Shipper, RN  Acute Care Coordinator [phone]  Phone:  [phone],  Fax:  [phone]  Weekends or holidays, please call weekend care coordinator at [phone] and enter pager number [phone]

## 2021-06-23 NOTE — Progress Notes (Addendum)
HMD APP Progress Note    Patient seen, evaluated and chart reviewed 06/23/21    Cindy Ochoa. Pt seen with attending. Pt denies any abdominal pain, n/v, fevers, chills, dyspnea. Pt with vaginal bleeding- reports she is due to have her nexplanon replaced in a month.    I/O last 3 completed shifts:  06/26 1500 - 06/27 1459  In: 795.8 (12.7 mL/kg) [I.V.:63 (0 mL/kg/hr); IV Piggyback:732.8]  Out: 1350 (21.6 mL/kg) [Urine:1350 (0.9 mL/kg/hr)]  Net: -554.2  Weight: 62.6 kg   I/O this shift:  06/27 1500 - 06/27 2259  In: - (0 mL/kg)   Out: 950 (15.2 mL/kg) [Urine:950]  Net: -950  Weight: 62.6 kg     Blood Pressure 117/79 (BP Location: Left arm)    Pulse 104    Temperature 35.5 C (95.9 F) (Temporal)    Respiration 16    Height 1.651 m (5' 5" )    Weight 62.6 kg (138 lb)    Oxygen Saturation 94%    Body Mass Index 22.96 kg/m       Exam:  General - NAD. Lying in bed  Pulm - NWOB. Lungs CTAB.  CV - RRR. S1/S2 present. No m/r/g noted.  Abd - NBS. Abdomen soft, NT, ND.  GU - assessed with attending and nursing - no rectal bleeding noted or bleeding from hemorrhoids. +Vaginal bleeding  Extrem - No LEE noted      Labs reviewed:   Recent Results (from the past 24 hour(s))   HCT and Hgb    Collection Time: 06/22/21  5:28 PM   Result Value Ref Range    Hemoglobin 12.5 11.2 - 15.7 g/dL    Hematocrit 39 34 - 45 %   MCHC    Collection Time: 06/22/21  5:28 PM   Result Value Ref Range    MCHC 33 32 - 36 g/dL   POCT glucose    Collection Time: 06/22/21  6:52 PM   Result Value Ref Range    Glucose POCT 186 (H) 60 - 99 mg/dL   POCT glucose    Collection Time: 06/22/21 10:01 PM   Result Value Ref Range    Glucose POCT 143 (H) 60 - 99 mg/dL   POCT glucose    Collection Time: 06/23/21  1:24 AM   Result Value Ref Range    Glucose POCT 145 (H) 60 - 99 mg/dL   CBC and differential    Collection Time: 06/23/21  4:15 AM   Result Value Ref Range    Hemoglobin 12.3 11.2 - 15.7 g/dL    Hematocrit 38 34 - 45 %    MCHC 33 32 - 36 g/dL    WBC 5.3 4.0 - 10.0  THOU/uL    RBC 4.1 3.9 - 5.2 MIL/uL    MCV 92 79 - 95 fL    MCH 30 26 - 32 pg    RDW 13.0 11.7 - 14.4 %    Platelets 94 (L) 160 - 370 THOU/uL    Seg Neut % 42.0 %    Lymphocyte % 47.0 %    Monocyte % 6.0 %    Eosinophil % 2.0 %    Basophil % 0.0 %    Neut # K/uL 2.2 1.6 - 6.1 THOU/uL    Lymph # K/uL 2.7 1.2 - 3.7 THOU/uL    Mono # K/uL 0.3 0.2 - 0.9 THOU/uL    Eos # K/uL 0.1 0.0 - 0.4 THOU/uL    Baso # K/uL 0.0 0.0 -  0.1 THOU/uL    Nucl RBC % 0.0 0.0 - 0.2 /100 WBC    Nucl RBC # K/uL 0.0 0.0 - 0.0 THOU/uL   Basic metabolic panel    Collection Time: 06/23/21  4:15 AM   Result Value Ref Range    Glucose 220 (H) 60 - 99 mg/dL    Sodium 135 133 - 145 mmol/L    Potassium 3.5 3.3 - 5.1 mmol/L    Chloride 102 96 - 108 mmol/L    CO2 22 20 - 28 mmol/L    Anion Gap 11 7 - 16    UN 13 6 - 20 mg/dL    Creatinine 0.38 (L) 0.51 - 0.95 mg/dL    eGFR BY CREAT 133 *    Calcium 9.6 8.8 - 10.2 mg/dL   Diff manual    Collection Time: 06/23/21  4:15 AM   Result Value Ref Range    React Lymph % 3 0 - 6 %    Manual DIFF RESULTS     Diff Based On 100 CELLS   POCT glucose    Collection Time: 06/23/21  8:44 AM   Result Value Ref Range    Glucose POCT 192 (H) 60 - 99 mg/dL   POCT glucose    Collection Time: 06/23/21  1:17 PM   Result Value Ref Range    Glucose POCT 167 (H) 60 - 99 mg/dL   HCT and Hgb    Collection Time: 06/23/21  4:10 PM   Result Value Ref Range    Hemoglobin 14.1 11.2 - 15.7 g/dL    Hematocrit 43 34 - 45 %   MCHC    Collection Time: 06/23/21  4:10 PM   Result Value Ref Range    MCHC 33 32 - 36 g/dL           A/P: Case of hyperglycemia with likely new diagnosis of DM2 in a 35 yo F with hx of C6 Quadriparesis, obesity, 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     Sepsis - resolved  - Unclear etiology though UA suggest UTI  - UCx- mixed flora concern for contamination   - BC- NGTD  - Stop cefepime today    Constipation with known hemorrhoids   - KUB from 6/24 shows moderate stool burden  - Continue PR bisacodyl every other day   - Continue  Miralax BID and Senna nightly  - Give TWE today      New Diabetes Mellitus- controlled.  - A1C 11.5 (06/18/21)  - Endocrine following, awaiting further testing to determine if she has T1DM vs LADA vs MODY vs ketosis prone T2DM  -- GAD and islet cell ab pending  -- Defer c-peptide testing outpt   - Outpt follow up scheduled for Friday  - C/w lantus 25 units qHS  - C/w LISS 1:40>140, nb 10 units  - C/w Metformin 540m BID at time of d/c (ok to start now per d/w endo today)  - DM consult tomorrow for insulin teach w/ family (family will be at bedside from 11 am -noon tomorrow)    Hx of ITP, Chronic Thrombocytopenia - Continue Fondaparinux     Vaginal bleeding, hx of hemorrhoids - vaginal bleeding on exam, recommend follow up with outpt gyn for nexplanon replacement in 1 month.   - H/H stable can check H/H daily    DVT prophylaxis: Fondaparinux  Dispo: TBD. Pending BGs and bowel movement    AEdson Snowball [phone] 5:09  PM  06/23/21        Current Facility-Administered Medications:     insulin glargine (LANTUS,SEMGLEE) injection 25 units, 25 units, Subcutaneous, Nightly, Harland Dingwall, NP, 25 units at 06/22/21 2211    insulin lispro (HumaLOG,ADMELOG) injection 0-20 units, 0-20 units, Subcutaneous, Daily, Harland Dingwall, NP, 12 units at 06/22/21 1915    metFORMIN (GLUCOPHAGE) tablet 500 mg, 500 mg, Oral, BID WC, Harland Dingwall, NP, 500 mg at 06/23/21 0938    polyethylene glycol (GLYCOLAX,MIRALAX) powder 17 g, 17 g, Oral, 2 times per day, Harland Dingwall, NP, 17 g at 06/23/21 1829    senna (SENOKOT) tablet 2 tablet, 2 tablet, Oral, Nightly, Harland Dingwall, NP, 2 tablet at 06/22/21 2210    Nursing communication- Give 4 OZ of fruit juice for BG < 70 mg/dl, , , PRN **AND** Glucose (TRUEPLUS) 15 GM oral gel 15 g of Glucose, 15 g of Glucose, Oral, PRN **AND** dextrose 50% (0.5 g/mL) injection 25 g, 25 g, Intravenous, PRN **AND** glucagon (GLUCAGEN) injection 1 mg, 1 mg, Intramuscular, PRN  **AND** POCT glucose, , , 4x Daily AC & HS, Arieno, Arthur, PA    melatonin tablet 3 mg, 3 mg, Oral, QHS PRN, Margarita Sermons, MD, 3 mg at 06/19/21 9371    acetaminophen (TYLENOL) tablet 1,000 mg, 1,000 mg, Oral, Q8H PRN, Carren Rang, PA, 1,000 mg at 06/20/21 0511    sodium chloride 0.9 % FLUSH REQUIRED IF PATIENT HAS IV, 0-500 mL/hr, Intravenous, PRN, Tish Frederickson, MD, Stopped at 06/21/21 0122    dextrose 5 % FLUSH REQUIRED IF PATIENT HAS IV, 0-500 mL/hr, Intravenous, PRN, Tish Frederickson, MD    bisacodyl (DULCOLAX) suppository 10 mg, 10 mg, Rectal, Every Other Day, Reinaldo Meeker, MD, 10 mg at 06/21/21 1110    hydrocortisone (ANUSOL-HC) suppository 25 mg, 25 mg, Rectal, Every Other Day, Reinaldo Meeker, MD, 25 mg at 06/21/21 0902    sodium chloride 0.9 % FLUSH REQUIRED IF PATIENT HAS IV, 0-500 mL/hr, Intravenous, PRN, Reinaldo Meeker, MD, Stopped at 06/23/21 1404    dextrose 5 % FLUSH REQUIRED IF PATIENT HAS IV, 0-500 mL/hr, Intravenous, PRN, Reinaldo Meeker, MD

## 2021-06-23 NOTE — Progress Notes (Signed)
Hospital Medicine Progress Note       LOS: 5 days     Subjective:  Seen with covering APP and PCT at bedside.  No complaint today, denies fever/chills.  No CP or SOB.  No nausea/vomiting.  Did report some bleeding with hygiene care today.      Objective:    BP: (101-130)/(78-88)   Temp:  [35.6 C (96.1 F)-35.7 C (96.3 F)]   Temp src: Temporal (06/27 0700)  Heart Rate:  [80-105]   Resp:  [15-16]   SpO2:  [98 %-100 %]     Last set of vitals:  Vitals:    06/23/21 0700   BP: 130/88   Pulse: 80   Resp: 16   Temp: 35.6 C (96.1 F)   Weight:    Height:          Intake/Output Summary (Last 24 hours) at 06/23/2021 1536  Last data filed at 06/23/2021 1431  Gross per 24 hour   Intake 795.77 ml   Output 1350 ml   Net -554.23 ml       Exam:   General: NAD  HEENT: MMM, OP clear  Cardiac: NRR  Resp: CTAB, normal WOB  Abd: Soft, NT/D, normal BS  GU:  Assessed with A.Balaji and PCT at bedside.  No rectal bleeding seen.  Did have vaginal bleeding with BRB      Labs:  Recent Labs   Lab 06/23/21  0415 06/22/21  1728 06/22/21  0422 06/21/21  2213 06/21/21  1316   WBC 5.3  --  5.3  --  4.9   Hematocrit 38 39 38   < > 39   Platelets 94*  --  83*  --  84*   INR  --   --   --   --  1.1    < > = values in this interval not displayed.     Recent Labs   Lab 06/23/21  0415 06/22/21  0422 06/21/21  1316   Sodium 135 135 137   Potassium 3.5 3.3 3.4   Chloride 102 101 102   CO2 22 22 20    UN 13 8 8    Creatinine 0.38* 0.36* 0.30*     Recent Labs   Lab 06/18/21  1404   AST 20   ALT 36*     No components found with this basename: LABALBU, LASTTBILI, ALKPHOS        Radiology:  No results found.    Current Meds:  Scheduled Meds:   insulin glargine  25 units Subcutaneous Nightly    insulin lispro  0-20 units Subcutaneous Daily    metFORMIN  500 mg Oral BID WC    polyethylene glycol  17 g Oral 2 times per day    senna  2 tablet Oral Nightly    bisacodyl  10 mg Rectal Every Other Day    hydrocortisone  25 mg Rectal Every Other Day     Continuous  Infusions:  PRN Meds:.   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     1. Hyperglycemia:  -challenge will be her functional status and she doesn't think she'll be able to do a basal/bolus regimen at home.   -Endocrine input appreciated  -increased lantus to 25  -Cont Lispro nightly with 10u nutritional baseline and 1:40 correction scale for BG >140  -Mother/Son to have diabetes teaching  -discontinue Metformin    2. Hx Hemorrhoids:  -cont hydrocortisone suppository    3. Hx of ITP:  -cont fondaparinux for prophylaxis    4.  Concern for Sepsis:  -resolved, cultures NGTD  -d/c antibiotics    5.  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      Signed by Lynnea Maizes pager [phone] on 06/23/2021 at 3:36 PM

## 2021-06-24 ENCOUNTER — Other Ambulatory Visit: Payer: Self-pay

## 2021-06-24 DIAGNOSIS — R739 Hyperglycemia, unspecified: Secondary | ICD-10-CM

## 2021-06-24 DIAGNOSIS — G8254 Quadriplegia, C5-C7 incomplete: Secondary | ICD-10-CM

## 2021-06-24 LAB — CBC AND DIFFERENTIAL
Baso # K/uL: 0 10*3/uL (ref 0.0–0.1)
Basophil %: 0 %
Eos # K/uL: 0 10*3/uL (ref 0.0–0.4)
Eosinophil %: 0 %
Hematocrit: 38 % (ref 34–45)
Hemoglobin: 11.7 g/dL (ref 11.2–15.7)
Lymph # K/uL: 1.4 10*3/uL (ref 1.2–3.7)
Lymphocyte %: 31.6 %
MCH: 30 pg (ref 26–32)
MCHC: 31 g/dL — ABNORMAL LOW (ref 32–36)
MCV: 96 fL — ABNORMAL HIGH (ref 79–95)
Mono # K/uL: 0.2 10*3/uL (ref 0.2–0.9)
Monocyte %: 5.3 %
Neut # K/uL: 2.7 10*3/uL (ref 1.6–6.1)
Nucl RBC # K/uL: 0 10*3/uL (ref 0.0–0.0)
Nucl RBC %: 0 /100 WBC (ref 0.0–0.2)
Platelets: 99 10*3/uL — ABNORMAL LOW (ref 160–370)
RBC: 4 MIL/uL (ref 3.9–5.2)
RDW: 13.2 % (ref 11.7–14.4)
Seg Neut %: 62.2 %
WBC: 4.3 10*3/uL (ref 4.0–10.0)

## 2021-06-24 LAB — BASIC METABOLIC PANEL
Anion Gap: 12 (ref 7–16)
CO2: 20 mmol/L (ref 20–28)
Calcium: 9 mg/dL (ref 8.8–10.2)
Chloride: 105 mmol/L (ref 96–108)
Creatinine: 0.32 mg/dL — ABNORMAL LOW (ref 0.51–0.95)
Glucose: 203 mg/dL — ABNORMAL HIGH (ref 60–99)
Lab: 11 mg/dL (ref 6–20)
Potassium: 3.9 mmol/L (ref 3.3–5.1)
Sodium: 137 mmol/L (ref 133–145)
eGFR BY CREAT: 139 *

## 2021-06-24 LAB — DIFF MANUAL
Diff Based On: 114 CELLS
React Lymph %: 1 % (ref 0–6)

## 2021-06-24 LAB — POCT GLUCOSE
Glucose POCT: 195 mg/dL — ABNORMAL HIGH (ref 60–99)
Glucose POCT: 289 mg/dL — ABNORMAL HIGH (ref 60–99)

## 2021-06-24 MED ORDER — ACETAMINOPHEN 500 MG PO TABS *I*
1000.0000 mg | ORAL_TABLET | Freq: Three times a day (TID) | ORAL | 5 refills | Status: DC | PRN
Start: 2021-06-24 — End: 2022-04-30

## 2021-06-24 MED ORDER — INSULIN LISPRO (HUMAN) 100 UNIT/ML SC SOPN *I*
2.0000 [IU] | PEN_INJECTOR | Freq: Every day | SUBCUTANEOUS | 0 refills | Status: DC
Start: 2021-06-24 — End: 2021-08-30
  Filled 2021-06-24: qty 15, 88d supply, fill #0

## 2021-06-24 MED ORDER — INSULIN GLARGINE-YFGN 100 UNIT/ML SC SOPN *WRAPPED*
25.0000 [IU] | PEN_INJECTOR | Freq: Every evening | SUBCUTANEOUS | 0 refills | Status: DC
Start: 2021-06-24 — End: 2021-07-17
  Filled 2021-06-24: qty 15, 60d supply, fill #0

## 2021-06-24 MED ORDER — POLYETHYLENE GLYCOL 3350 PO POWD *I*
17.0000 g | Freq: Two times a day (BID) | ORAL | 0 refills | Status: AC
Start: 2021-06-24 — End: 2021-07-24
  Filled 2021-06-24: qty 1020, 30d supply, fill #0

## 2021-06-24 MED ORDER — METFORMIN HCL 500 MG PO TABS *I*
500.0000 mg | ORAL_TABLET | Freq: Two times a day (BID) | ORAL | 5 refills | Status: DC
Start: 2021-06-24 — End: 2021-10-13
  Filled 2021-06-24: qty 60, 30d supply, fill #0
  Filled 2021-08-11: qty 60, 30d supply, fill #1
  Filled 2021-10-13: qty 60, 30d supply, fill #2

## 2021-06-24 MED ORDER — MELATONIN 3 MG PO TABS *I*
3.0000 mg | ORAL_TABLET | Freq: Every evening | ORAL | 0 refills | Status: DC | PRN
Start: 2021-06-24 — End: 2022-04-30
  Filled 2021-06-24: qty 30, 30d supply, fill #0

## 2021-06-24 MED ORDER — INSULIN PEN NEEDLE 32G X 4 MM MISC *A*
0 refills | Status: DC
Start: 2021-06-24 — End: 2021-06-25
  Filled 2021-06-24: qty 100, 30d supply, fill #0

## 2021-06-24 NOTE — Progress Notes (Signed)
Report Given To  Josh E      Descriptive Sentence / Reason for Admission   CC: Hyperglycemia, new onset DM2  HX: Quadriplegia, autonomic dysfunction, neurogenic bowel and bladder, chronic thrombocytopenia, IVC filter,   FULL CODE        Active Issues / Relevant Events   Family member contact and phone # =  pin # =   *A&Ox3, med whole, high fall risk*  Cam screen: negative  Pain  Ambulatory status: wheelchair   O2: none       6/25 overnight- refused labs unless they were in her foot, notified PA Nate Maggio and said to cancel labs for this morning. Refusing to be turned at times. Abdomen is distended a KUB ordered and showed moderate stool.  6/25D: IV put in L wrist.   6-26 Overnight-Still receiving IV abx. Has Left wrist IV patent , dry and intact. Good urine output. No c/o pain. Extra large BM with Moderat amount of mucousy blood notified Nate Maggio PA and ordered H&H for now which came back normal.  6/27 D: NAE, metformin D/Cd, son to come in for diabetes education tomorrow 11am per patient, cbc now daily, hh 14.1 43  6/28 N: Bgs around 250, NAE        To Do List          Anticipatory Guidance / Discharge Planning  *glasses, dentures, hearing aides*   2 identified visitors:  Where from:  Placement:  Transportation home:  Discharge date:   LOS:

## 2021-06-24 NOTE — Discharge Instructions (Signed)
Your diagnosis was: Diabetes, high blood sugar     What was done: Endocrinology consult, Diabetes Education, Started Insulin and reviewed with you and your family. Briefly treated for a UTI but we stopped antibiotics as it didn't look like you had a true urinary tract infection    What do I need to do: take medications as instructed including insulin     Recommended diet: Consistent Carbohydrate    Recommended activity: activity as tolerated    Diabetes management:check Fingerstick blood glucoses before meals and bedtime- keep a record for your Dr. And take to your appointment    HOME INSULIN REGIMEN     Lantus/Levemir/Basaglar/Semglee (long acting insulin, choice depends on insurance coverage) dose: 25 units nightly  *Take one time per day, at the same time every day, even if blood sugar is normal  ___________________________________________________________________     Humalog/Novolog/Admelog (fast acting insulin, choice depends on insurance coverage) dose:  *Take before dinner only. Use the blood sugar reading from your glucose meter to determine how many units of fast acting insulin to take.    Blood Sugar Insulin Dose IF EATING Insulin Dose if NOT Eating   Lower than 70: hold insulin and treat with 15 grams of carbohydrate, (1/2 cup of juice) 0 units 0 units   70 - 140 10 units 0 units   141 - 180 11 units 1 units   181 - 220 12 units 2 units   221 - 260 13 units 3 units   261 - 300 14 units 4 units   301 - 340 15 units 5 units   341 - 380 16 units 6 units   Higher than 380: give insulin and call your Doctor! 17 units 7 units       Medication changes to highlight: see attached sheet      If you experience any of these symptoms 24 hours or more after discharge:Uncontrolled pain, Chest pain, Shortness of breath, Fever of 101 F. or greater, Chills, Poor appetite, Poor urinary output, Vomiting, Nausea, Diarrhea, Blood in stool or Weakness  Call Dr. Vickey Sages    If you experience any of these symptoms within the first  24 hours after discharge call Dr.  Rogers Blocker at [phone]      Medications  Your doctor has prescribed medications to improve or manage your condition.  You should take them as prescribed by your doctor.  Ask your doctor for any questions regarding these medications.    Diet  A healthy diet is important to help you stay well.  Some health conditions require you to be on a special diet.    Reading food labels is helpful when you are on a special diet.  Follow instructions from your doctor for any other special dietary requirement.    Exercise/Activity  Activity and exercise are important to your well being.  While you are in the hospital your activity may be restricted.  As your condition improves your activity level will be increased.  Most patients will be able to gradually resume activity as before.  You should follow your doctors activity recommendations       What to do if your condition changes?  If at any time you have any questions or concerns or your condition gets worse, contact your physician.  If you can not reach your physician or you develop life threatening symptoms such as trouble breathing or chest pain you should go to the closest Emergency Department.

## 2021-06-24 NOTE — Progress Notes (Signed)
Hospital Medicine progress note:    Patient seen/examined.  No complaint today.  Denies fever/chills.  No CP or SOB.  No nausea/vomiting.  DM teaching done with son at bedside.  Medically stable for discharge to home today with close PCP and Endocrine follow-up.  Discussed with nursing staff, Covering APP and Care Coordinator.      Lynnea Maizes, MD

## 2021-06-24 NOTE — Plan of Care (Signed)
Problem: Safety  Goal: Patient will remain free of falls  Outcome: Patient discharged

## 2021-06-24 NOTE — Progress Notes (Signed)
Patient ready for discharge. PIV removed, discharge instructions reviewed with understanding. Diabetes education done with Cindy Ochoa, son was present. All belongings with patient at time of discharge. Patient's son will be transporting her home. All diabetic supplies with patient.

## 2021-06-24 NOTE — Consults (Signed)
Certified Diabetes Care and Education Specialist Note    Received consult for diabetes education with patient family.  Son present for education. Patient was seen earlier in admission on 06/19/21 and 06/20/21 as well.      Reviewed with patient and her son: target blood sugars, basal vs bolus insulin, signs and symptoms of hypoglycemia and hyperglycemia and appropriate treatment and when to call MD for low or high BG. Written information was provided for all of the above.    Had patient son practice with her new Freestyle Freedom Lite glucose meter.  Discussed testing blood sugars 1-2 times daily (before breakfast and dinner) and reviewed target blood sugars.   Patient son was able to practice checking her BG with minimal prompting after being shown how to use the meter.     Reviewed Lantus and Humalog doses and administration with insulin pens. Patient son was able to provide a return demonstration with minimal prompting. Son verbalizes understanding of Lantus and Humalog doses.  He was able to use a correction scale with eating and not eating doses. He understands patient will get the fast-acting insulin with dinner only. She can also receive the long-acting insulin at the same time for convenience.     Please add insulin doses to discharge instructions using these steps:  1. Type home insulin into the SmartText box at the top of the patients discharge instructions portion of their After Visit Summary     2. Press accept button    3. Press F2 to open up the various insulin scales and select your choice    4. Press F2 to fill in the doses     Patient son did well with education and appears willing to help his mother.     Patient will need these items at discharge:     Use Insulin & Diabetes Supplies Order Set     Humalog or Admelog pens    Lantus, Basaglar or Semglee pens     Insulin pen needles 4 mm    Royann Shivers, [place], [place]

## 2021-06-25 ENCOUNTER — Other Ambulatory Visit: Payer: Self-pay

## 2021-06-25 DIAGNOSIS — E119 Type 2 diabetes mellitus without complications: Secondary | ICD-10-CM

## 2021-06-25 LAB — BLOOD CULTURE: Bacterial Blood Culture: 0

## 2021-06-25 MED ORDER — INSULIN PEN NEEDLE 32G X 4 MM MISC *A*
0 refills | Status: DC
Start: 2021-06-25 — End: 2022-04-07

## 2021-06-25 MED ORDER — FREESTYLE LANCETS MISC *A*
1 refills | Status: DC
Start: 2021-06-25 — End: 2021-08-25

## 2021-06-25 NOTE — [place] Notes (Signed)
Ordered needles per patient request.    Gwenith Spitz, MD

## 2021-06-25 NOTE — Addendum Note (Signed)
Addended by: Clearence Cheek on: 06/25/2021 05:46 PM     Modules accepted: Orders

## 2021-06-26 ENCOUNTER — Ambulatory Visit: Payer: Medicaid Other | Admitting: Plastic Surgery

## 2021-06-26 ENCOUNTER — Encounter: Payer: Self-pay | Admitting: Plastic Surgery

## 2021-06-26 VITALS — BP 96/56 | HR 91 | Temp 98.4°F | Ht 65.0 in | Wt 138.0 lb

## 2021-06-26 DIAGNOSIS — L720 Epidermal cyst: Secondary | ICD-10-CM

## 2021-06-26 NOTE — H&P (Signed)
Plastic Surgery    SUBJECTIVE     History of present Illness:  Cindy Ochoa is a 35 y.o.  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 Notes this can be tender and sometimes has drainage   .The patient denies recent weight loss, night sweats or change in appetite.     Allergies:   Allergies   Allergen Reactions    Heparin Other (See Comments)     Thrombocytopenia; HIT    Ibuprofen Swelling     Pt reports lip swelling after taking ibuprofen.     Nitrofurantoin Nausea And Vomiting       Past Medical/ Surgical History: Reviewed and included in the electronic medical records    Social History: Reviewed and included in the electronic medical records    Family History: Reviewed and included in the electronic medical records    OBJECTIVE     Vitals: BP 96/56    Pulse 91    Temp 36.9 C (98.4 F)    Ht 1.651 m (5\' 5" )    Wt 62.6 kg (138 lb)    SpO2 96%    BMI 22.96 kg/m   General: The patient is alert and oriented x3, is well dressed and well kept, pleasant and cooperative through the evaluation.  Constitutional: Stable vital signs, hemodynamically stable, in no acute distress.  Lungs: unlabored respirations, no intercostal retractions or accessory muscle use   Skin: skin color, texture and turgor are normal for age.   Positive findings: firm, mobile,  mass measuring 1.5 cm to L aspect of neck. Surrounding skin is intact without erythema, drainage or any signs of infection                    ASSESSMENT     Cindy Ochoa is a 35 y.o.  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 Mass can be excised as an  office procedure under local anesthesia. Patient is able to adjust wheelchair for procedure. Indications, alternatives, risks, benefits and post-op care were reviewed with patient.She is aware she would be trading mass for a scar.    She is agreeable to proceed and would like to schedule this after the summer so she will schedule procedure in september       The patient is in full understanding with the above and agrees with the plan. All questions were answered.    Harmon Dun, NP as of 2:25 PM 06/26/2021

## 2021-06-27 ENCOUNTER — Ambulatory Visit: Payer: Medicaid Other | Admitting: Student in an Organized Health Care Education/Training Program
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
## 2021-06-30 ENCOUNTER — Encounter: Payer: Self-pay | Admitting: Nurse Practitioner

## 2021-07-01 NOTE — Discharge Summary (Signed)
Name: Cindy Ochoa MRN: [id number] DOB: [birth year]     Admit Date: 06/18/2021   Date of Discharge: 06/24/2021     Patient was accepted for discharge to   [facility] [place] [6]           Discharge Attending Physician: Lynnea Maizes, MD      Hospitalization Summary    CONCISE NARRATIVE: Cindy Ochoa presented with hyperglycemia and likely new diagnosis of diabetes (type to be determined).  She was seen by Endocrine and started on Metformin, Lantus qHS and nightly lispro.  She received diabetes education and her son was also instructed on insulin use.  This regimen was chosen due to her functional limitations and that she is home alone during the daytime.  Further workup and adjustment to regimen will happen as an outpatient with Endocrine follow-up. She was noted to have vaginal bleeding and reports her Nexplanon is due to be replaced.  She was encouraged to follow-up with her GYN team.  Blood sugars improved and she was discharged to home with family support.                XRAY RESULTS: 6/24 KUB: Unremarkable abdominal series.       SIGNIFICANT MED CHANGES: Yes  Start the following:  Lantus 25 units qHS  LISS 1:40 >140, NB 10 (only for dinner time)  Metformin 500 mg BID   CONSULTANT SERVICE    Wittlin, Mikey Kirschner, MD Endocrinology             Signed: Lynnea Maizes, MD  On: 07/01/2021  at: 1:16 PM

## 2021-07-03 ENCOUNTER — Ambulatory Visit: Payer: Medicaid Other | Admitting: Nurse Practitioner

## 2021-07-09 ENCOUNTER — Ambulatory Visit
Admission: RE | Admit: 2021-07-09 | Discharge: 2021-07-09 | Disposition: A | Discharge status: Home / Self Care | Payer: Medicaid Other | Source: Ambulatory Visit | Attending: Obstetrics and Gynecology | Admitting: Obstetrics and Gynecology

## 2021-07-09 ENCOUNTER — Ambulatory Visit: Payer: Medicaid Other | Admitting: Obstetrics and Gynecology

## 2021-07-09 VITALS — BP 130/76 | HR 94 | Ht 65.0 in | Wt 135.0 lb

## 2021-07-09 DIAGNOSIS — Z309 Encounter for contraceptive management, unspecified: Secondary | ICD-10-CM

## 2021-07-09 DIAGNOSIS — T85898A Other specified complication of other internal prosthetic devices, implants and grafts, initial encounter: Secondary | ICD-10-CM

## 2021-07-09 DIAGNOSIS — Z3046 Encounter for surveillance of implantable subdermal contraceptive: Secondary | ICD-10-CM | POA: Insufficient documentation

## 2021-07-09 MED ORDER — ETONOGESTREL 68 MG SC IMPL *I*
68.0000 mg | DRUG_IMPLANT | Freq: Once | SUBCUTANEOUS | Status: AC | PRN
Start: 2021-07-09 — End: 2021-07-09
  Administered 2021-07-09: 68 mg

## 2021-07-09 NOTE — Procedures (Signed)
Nexplanon remove/insert  Date/Time: 07/09/2021 10:30 AM EDT  Performed by: Philipp Deputy, MD    Cindy Ochoa is a 35 y.o. G1P1001 who requests removal of current Nexplanon and insertion of a new Nexplanon device.      Urine pregnancy test No results found for requested lab(s) within last 72 hours.  The current device is being removed due to the device reaching the end of its effectiveness.   A new device is being inserted due to the need for regulation of menses and contraception.     Consent:   Consent obtained:  Written  Consent given by:  Patient  The procedure risks, benefits, complications and possible alternatives were discussed with the patient.  All questions were answered prior to the patient signing the informed consent.        Pre-procedure:   Chaperone present:  Yes  Chaperone Name:  Dara Lords, MD      Procedure:   The left arm was prepped with alcohol swabs and Iodine. Local anesthesia achieved  with injection of 4 cc of 1% lidocaine into distal implant area. Device removed intact. A new Nexplanon device was inserted per the manufacterer's instructions without difficulty. Rod palpated under the skin by provider. The wound edges were approximated and held closed with bandaid. A dry sterile pressure dressing was placed. Blood loss was minimal.  The patient tolerated the procedure with some discomfort.       Assessment:  Nexplanon removal at patient request and insertion of new Nexplanon device.  68 mg etonogestrel 68 MG      Plan:  Patient's Nexplanon was located and marked under ultrasound guidance prior to the procedure.     Post insertion instructions were reviewed with the patient and written information was also provided.    Instructions included:  Noting that her arm will be achy for about 4 days and she will have mild swelling as well as a bruise as it heals.  She should keep the dressing on for a day to help keep the area clean and dry.  She was warned to do no heavy lifting with that arm  for the first 24 hours.  For soreness she can take acetaminophen and/or ibuprofen.  She was advised to call with any issues including fever, severe pain, redness or excessive swelling at insertion site.       The patient will follow up as needed.

## 2021-07-11 ENCOUNTER — Encounter: Payer: Self-pay | Admitting: Internal Medicine

## 2021-07-11 ENCOUNTER — Ambulatory Visit: Payer: Medicaid Other | Attending: Adult Health | Admitting: Internal Medicine

## 2021-07-11 VITALS — BP 91/55 | HR 94 | Temp 98.4°F | Ht 65.0 in

## 2021-07-11 DIAGNOSIS — R221 Localized swelling, mass and lump, neck: Secondary | ICD-10-CM | POA: Insufficient documentation

## 2021-07-11 DIAGNOSIS — F32A Depression, unspecified: Secondary | ICD-10-CM | POA: Insufficient documentation

## 2021-07-11 DIAGNOSIS — Z993 Dependence on wheelchair: Secondary | ICD-10-CM | POA: Insufficient documentation

## 2021-07-11 DIAGNOSIS — E119 Type 2 diabetes mellitus without complications: Secondary | ICD-10-CM | POA: Insufficient documentation

## 2021-07-11 LAB — POCT GLUCOSE: Glucose POCT: 302 mg/dL — ABNORMAL HIGH (ref 60–99)

## 2021-07-11 LAB — POCT HEMOGLOBIN A1C: Hemoglobin A1C,POC: 10.8 % — ABNORMAL HIGH
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
## 2021-07-11 NOTE — Patient Instructions (Signed)
Call to schedule your follow up at the urology office at [phone]    Start taking diabetic medication as recommended.   Follow up with endocrinologist on 7/21.

## 2021-07-14 ENCOUNTER — Encounter: Payer: Self-pay | Admitting: Gastroenterology

## 2021-07-14 ENCOUNTER — Other Ambulatory Visit: Payer: Self-pay

## 2021-07-14 ENCOUNTER — Other Ambulatory Visit: Payer: Medicaid Other

## 2021-07-14 NOTE — Progress Notes (Signed)
CM called and spoke with patient today. CM and patient reviewed her upcoming appointments for this month. Patient was made aware of her appointments. Patient stated she has been doing fine since recently being discharged. Patient notified CM that she is going to start receiving therapy services through [facility]. Patient also stated that she recently has been neglecting her health due to stressors inside her home. However, patient stated she is on the right track now, and staying more consistent with her medications. Patient stated she had no other concerns at this time.

## 2021-07-15 ENCOUNTER — Encounter: Payer: Self-pay | Admitting: Plastic Surgery
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
## 2021-07-16 ENCOUNTER — Telehealth: Payer: Self-pay | Admitting: Internal Medicine

## 2021-07-16 NOTE — Telephone Encounter (Signed)
Copied from [place] [id number]. Topic: Access to Care - Labs/Orders/Imaging  >> Jul 16, 2021 12:36 PM Tilden Dome wrote:  Gwenette Greet with [facility] reporting that they have completed the community health assessment, is pending until [place] can develop plan of care with [facility] Lacy can be reached [phone] - long term care team.

## 2021-07-17 ENCOUNTER — Other Ambulatory Visit
Admission: RE | Admit: 2021-07-17 | Discharge: 2021-07-17 | Disposition: A | Discharge status: Home / Self Care | Payer: Medicaid Other | Source: Ambulatory Visit | Attending: Obstetrics and Gynecology | Admitting: Obstetrics and Gynecology

## 2021-07-17 ENCOUNTER — Other Ambulatory Visit: Payer: Self-pay

## 2021-07-17 ENCOUNTER — Ambulatory Visit: Payer: Medicaid Other | Admitting: Nutrition

## 2021-07-17 ENCOUNTER — Ambulatory Visit
Payer: Medicaid Other | Attending: Endocrinology | Admitting: Student in an Organized Health Care Education/Training Program

## 2021-07-17 VITALS — BP 92/54 | HR 103 | Ht 65.0 in | Wt 137.0 lb

## 2021-07-17 DIAGNOSIS — E109 Type 1 diabetes mellitus without complications: Secondary | ICD-10-CM

## 2021-07-17 DIAGNOSIS — Z113 Encounter for screening for infections with a predominantly sexual mode of transmission: Secondary | ICD-10-CM

## 2021-07-17 DIAGNOSIS — E119 Type 2 diabetes mellitus without complications: Secondary | ICD-10-CM | POA: Insufficient documentation

## 2021-07-17 LAB — LIPID PANEL
Chol/HDL Ratio: 2.9
Cholesterol: 123 mg/dL
HDL: 43 mg/dL (ref 40–60)
LDL Calculated: 56 mg/dL
Non HDL Cholesterol: 80 mg/dL
Triglycerides: 121 mg/dL

## 2021-07-17 LAB — MULTIPLE ORDERING DOCS

## 2021-07-17 MED ORDER — SEMAGLUTIDE 3 MG PO TABS *I*
3.0000 mg | ORAL_TABLET | Freq: Every day | ORAL | 0 refills | Status: DC
Start: 2021-07-17 — End: 2021-08-26
  Filled 2021-07-17: qty 30, 30d supply, fill #0

## 2021-07-17 MED ORDER — INSULIN GLARGINE-YFGN 100 UNIT/ML SC SOPN *WRAPPED*
30.0000 [IU] | PEN_INJECTOR | Freq: Every evening | SUBCUTANEOUS | 0 refills | Status: DC
Start: 2021-07-17 — End: 2021-08-26
  Filled 2021-07-17 – 2021-08-25 (×2): qty 15, 50d supply, fill #0

## 2021-07-17 NOTE — Patient Instructions (Addendum)
It was a pleasure to see you in endocrinology clinic today. Here is what we discussed:    -See the diabetes educator  -We will place a study glucose sensor for you. Mail it back to [place] when you are done.   -We will start a medication called rybelsus 3mg  once a day. Please take this on empty stomach 30 minutes before you eat breakfast in the morning. If you develop nausea, vomiting, abdominal pain, worsening constipation or worsening acid reflux, then please let me know. Message me in 4 weeks and tell me how you are doing with it.  -Increase glargine the dose to 30 units at bedtime.     Please make a follow up appointment to see me in 3 months.    If you have not already done so, please sign up for MyChart. This is the most effective means of communication. If you are unable to sign up for MyChart, please ensure your phone number at our clinic is up to date.    If you have any questions or concerns, please feel free to contact our endocrinology clinic at [phone].    Best wishes,  Dr. Mikeal Hawthorne

## 2021-07-17 NOTE — Progress Notes (Signed)
Cindy Ochoa  was seen in the [facility] for a 72 hour continuous glucose monitoring study.  A glucose sensor was inserted subcutaneously using clean technique.  Patient training and hook up was completed.  Patient will return sensor via drop-off for the sensor download.

## 2021-07-18 LAB — HIV-1/2  ANTIGEN/ANTIBODY SCREEN WITH CONFIRMATION: HIV 1&2 ANTIGEN/ANTIBODY: NONREACTIVE

## 2021-07-18 LAB — SYPHILIS SCREEN
Syphilis Screen: NEGATIVE
Syphilis Status: NONREACTIVE

## 2021-07-18 LAB — HEPATITIS B SURFACE ANTIGEN: HBV S Ag: NEGATIVE

## 2021-07-18 LAB — HEPATITIS C ANTIBODY: Hep C Ab: NEGATIVE

## 2021-07-21 NOTE — Result Encounter Note (Signed)
LDL at goal 

## 2021-07-22 ENCOUNTER — Ambulatory Visit: Payer: Medicaid Other | Admitting: Nutrition

## 2021-07-22 ENCOUNTER — Other Ambulatory Visit: Payer: Self-pay

## 2021-07-28 ENCOUNTER — Other Ambulatory Visit: Payer: Self-pay

## 2021-07-29 ENCOUNTER — Encounter: Payer: Self-pay | Admitting: Gastroenterology

## 2021-07-30 ENCOUNTER — Telehealth: Payer: Self-pay | Admitting: Internal Medicine

## 2021-07-30 NOTE — Telephone Encounter (Signed)
Copied from [place] [id number]. Topic: Access to Care - Speak to Provider/Office Staff  >> Jul 30, 2021 10:32 AM Konrad Dolores wrote:  [facility], is calling to report they have extended home care services while they work on the recertification for patient.     If needed, [facility] team can be reached back at : [phone]

## 2021-07-30 NOTE — Telephone Encounter (Addendum)
Copied from [place] [id number]. Topic: Access to Care - Speak to Provider/Office Staff  >> Jul 30, 2021 10:19 AM Levie Heritage wrote:  Lyna Poser BC/BS    Calling to let the office know the patient has been re-certified for home care services. The patient is provided 126 hours per week.     Authorization number: [id number]    Call back [phone] Regulatory affairs officer asked for the call back number and the representative disconnected the call)

## 2021-07-30 NOTE — Telephone Encounter (Signed)
Message Noted.

## 2021-08-04 ENCOUNTER — Ambulatory Visit: Payer: Medicaid Other | Admitting: Physician Assistant

## 2021-08-05 ENCOUNTER — Encounter: Payer: Self-pay | Admitting: Nutrition

## 2021-08-05 NOTE — Progress Notes (Signed)
[facility] returned continuous glucose sensor for 72 hour study. The sensor was inserted on 07/17/21. The data download was completed and reports generated. Sent to provider for interpretation.  No logs returned.

## 2021-08-07 ENCOUNTER — Other Ambulatory Visit: Payer: Self-pay

## 2021-08-08 ENCOUNTER — Encounter: Payer: Self-pay | Admitting: Student in an Organized Health Care Education/Training Program

## 2021-08-11 ENCOUNTER — Other Ambulatory Visit: Payer: Self-pay

## 2021-08-11 ENCOUNTER — Ambulatory Visit: Payer: Medicaid Other | Admitting: Internal Medicine

## 2021-08-11 ENCOUNTER — Other Ambulatory Visit: Payer: Self-pay | Admitting: Physician Assistant

## 2021-08-11 DIAGNOSIS — E119 Type 2 diabetes mellitus without complications: Secondary | ICD-10-CM

## 2021-08-12 ENCOUNTER — Ambulatory Visit: Payer: Medicaid Other | Admitting: Internal Medicine

## 2021-08-12 ENCOUNTER — Other Ambulatory Visit: Payer: Self-pay

## 2021-08-12 ENCOUNTER — Encounter: Payer: Self-pay | Admitting: Gastroenterology

## 2021-08-12 ENCOUNTER — Ambulatory Visit: Payer: Medicaid Other | Admitting: Otolaryngology

## 2021-08-12 MED ORDER — INSULIN PEN NEEDLE 32G X 4 MM MISC *A*
11 refills | Status: DC
Start: 2021-08-12 — End: 2022-04-07
  Filled 2021-08-12: qty 100, 30d supply, fill #0
  Filled 2021-08-25: qty 100, 30d supply, fill #1
  Filled 2021-10-13: qty 100, 30d supply, fill #2

## 2021-08-12 NOTE — Progress Notes (Signed)
Patient reached out to CM today with a concern regarding transportation. Patient stated she was having issues with her transportation and is unable to make her appointment today. CM then reached out to team PSS who rescheduled patients appointment for 8/29 at 2:40pm. CM then scheduled patients transportation through metro for this appointment at [facility]. CM then notified and updated patient on this information.     Genisis Yurchak is scheduled for a Primary Care Office appointment on 08/25/2021 at 2:45pm.  At 1:45pm [employer] will pick up [employer] at [place] and take them to [place], [place] will pick up [employer] at [place] and take them to [place], [place] may contact [place] at [id number] for changes to pick-up and return times prior to this trip.      Lindalou Galicia's Trip  Date: 08/25/2021 Time: 2:45pm Inv# [id number]

## 2021-08-15 ENCOUNTER — Other Ambulatory Visit: Payer: Self-pay

## 2021-08-20 ENCOUNTER — Ambulatory Visit: Payer: Medicaid Other | Admitting: Plastic Surgery

## 2021-08-20 ENCOUNTER — Ambulatory Visit: Payer: Medicaid Other | Admitting: Optometry

## 2021-08-20 ENCOUNTER — Encounter: Payer: Self-pay | Admitting: Plastic Surgery

## 2021-08-20 VITALS — BP 83/53 | HR 107 | Temp 97.3°F | Ht 65.0 in | Wt 140.0 lb

## 2021-08-20 DIAGNOSIS — L720 Epidermal cyst: Secondary | ICD-10-CM

## 2021-08-20 NOTE — Invasive Procedure Plan of Care (Signed)
[place]  OR SURGICAL PROCEDURE                            Patient Name: Cindy Ochoa  [facility] [id number] MR                                                              DOB: [birth year]         Please read this form or have someone read it to you.   It's important to understand all parts of this form. If something isn't clear, ask [place] to explain.   When you sign it, that means you understand the form and give [place] permission to do this surgery or procedure.     I agree for Yevonne Aline, MD along with any assistants* they may choose, to treat the following condition(s): Cyst/mass of neck   By doing this surgery or procedure on me: Removal of lump on neck   This is also known as: Excision of cyst/mass of neck   Laterality: Left     *if you'd like a list of the assistants, please ask. We can give that to you.    1. The care provider has explained my condition to me. They have told me how the procedure can help me. They have told me about other ways of treating my condition. I understand the care provider cannot guarantee the result of the procedure. If I don't have this procedure, my other choices are: No surgery or procedure    2. The care provider has told me the risks (problems that can happen) of the procedure. I understand there may be unwanted results. The risks that are related to this procedure include: Bleeding, infection, damage to surrounding structures, seroma, contour irregularity, scar, recurrence, development of new cyst/mass elsewhere, delayed wound healing, need for additional procedures.    3. I understand that during the procedure, my care provider may find a condition that we didn't know about before the treatment started. Therefore, I agree that my care provider can perform any other treatment which they think is necessary and available.    4. I give permission to the hospital and/or its departments to examine and keep tissue, blood, body parts, fluids or materials removed from my  body during the procedure(s) to aid in diagnosis and treatment, after which they may be used for scientific research or teaching by appropriate persons. If these materials are used for science or teaching, my identity will be protected. I will no longer own or have any rights to these materials regardless of how they may be used.    5. My care provider might want a representative from a [place] to be there during my procedure. I understand that person works for:          The ways they might help my care provider during my procedure include:            6. Here are my decisions about receiving blood, blood products, or tissues. I understand my decisions cover the time before, during and after my procedure, my treatment, and my time in the hospital. After my procedure, if my condition changes a lot, my care provider will talk with me again about  receiving blood or blood products. At that time, my care provider might need me to review and sign another consent form, about getting or refusing blood.    I understand that the blood is from the community blood supply. Volunteers donated the blood, the volunteers were screened for health problems. The blood was examined with very sensitive and accurate tests to look for hepatitis, HIV/AIDS, and other diseases. Before I receive blood, it is tested again to make sure it is the correct type.    My chances of getting a sickness from blood products are small. But no transfusion is 100% safe. I understand that my care provider feels the good I will receive from the blood is greater than the chances of something going wrong. My care provider has answered my questions about blood products.      My decision  about blood or  blood products   Not applicable.        My decision   about tissue  Implants     Not applicable.          I understand this  form.    My care provider  or his/her  assistants have  explained:   What I am having done and why I need it.  What other  choices I can make instead of having this done.  The benefits and possible risks (problems) to me of having this done.  The benefits and possible risks (problems) to me of receiving transplants, blood, or blood products.  There is no guarantee of the results.  The care provider may not stay with me the entire time that I am in the operating or procedure room.  My provider has explained how this may affect my procedure. My provider has answered my  questions about this.         I give my  permission for  this surgery or  procedure.            _______________________________________________                                     My signature  (or parent or other person authorized to sign for you, if you are unable to sign for  yourself or if you are under 38 years old)        ______           Date        _____        Time   Electronic Signatures will display at the bottom of the consent form.    Care provider's statement: I have discussed the planned procedure, including the possibility for transfusion of blood  products or receipt of tissue as necessary; expected benefits; the possible complications and risks; and possible alternatives  and their benefits and risks with the patients or the patient's surrogate. In my opinion, the patient or the patient's surrogate  understands the proposed procedure, its risks, benefits and alternatives.              Electronically signed by: Charma Igo, NP                                                08/20/2021  Date        2:10 PM        Time

## 2021-08-21 NOTE — Progress Notes (Signed)
35 year old patient who has history of sebaceous cysts.  The patient presented to our clinic with a very small sebaceous cyst on the posterior lateral aspect of the left neck.  For that reason in the minor procedure room under local were able to perform an I&D.  The neck was prepped and draped in the usual sterile fashion and after injecting local anesthetic a small I&D of 1.5 cm was performed.  There was no active pus or sinuses that were observed however there is an ingrown hair that was removed completely which I believe was causing the issue.  After this the wound was cleaned and left open to heal by secondary intention in order to avoid potentially recurrences.  Patient tolerated the procedure well and we will continue to follow.

## 2021-08-22 ENCOUNTER — Ambulatory Visit: Payer: Medicaid Other | Admitting: Psychiatry

## 2021-08-25 ENCOUNTER — Ambulatory Visit: Payer: Medicaid Other | Admitting: Internal Medicine

## 2021-08-25 ENCOUNTER — Other Ambulatory Visit: Payer: Self-pay | Admitting: Student in an Organized Health Care Education/Training Program

## 2021-08-25 ENCOUNTER — Other Ambulatory Visit: Payer: Self-pay

## 2021-08-25 ENCOUNTER — Other Ambulatory Visit: Payer: Self-pay | Admitting: Internal Medicine

## 2021-08-25 DIAGNOSIS — E119 Type 2 diabetes mellitus without complications: Secondary | ICD-10-CM

## 2021-08-25 MED ORDER — FREESTYLE LANCETS MISC *A*
1 refills | Status: DC
Start: 2021-08-25 — End: 2021-10-13
  Filled 2021-08-25 – 2021-10-13 (×2): qty 100, 25d supply, fill #0

## 2021-08-25 NOTE — Telephone Encounter (Signed)
Dr. Herbie Saxon unavailable. Refill request routed to today's grouper Dr. Rigoberto Noel for processing 08/25/2021 11:22 AM    If appropriate, please refill the script for 90 days.

## 2021-08-25 NOTE — Telephone Encounter (Signed)
Dr. Herbie Saxon unavailable. Refill request routed to today's grouper Dr. Rigoberto Noel for processing 08/25/2021 11:23 AM    If appropriate, please refill the script for 90 days.

## 2021-08-26 ENCOUNTER — Other Ambulatory Visit: Payer: Self-pay | Admitting: Internal Medicine

## 2021-08-26 ENCOUNTER — Other Ambulatory Visit: Payer: Self-pay | Admitting: Neurosurgery

## 2021-08-26 ENCOUNTER — Other Ambulatory Visit: Payer: Self-pay

## 2021-08-26 ENCOUNTER — Other Ambulatory Visit: Payer: Self-pay | Admitting: Student in an Organized Health Care Education/Training Program

## 2021-08-26 DIAGNOSIS — E119 Type 2 diabetes mellitus without complications: Secondary | ICD-10-CM

## 2021-08-26 MED ORDER — SEMAGLUTIDE 7 MG PO TABS *I*
7.0000 mg | ORAL_TABLET | Freq: Every day | ORAL | 3 refills | Status: DC
Start: 2021-08-26 — End: 2021-10-13

## 2021-08-26 MED ORDER — INSULIN GLARGINE-YFGN 100 UNIT/ML SC SOPN *WRAPPED*
30.0000 [IU] | PEN_INJECTOR | Freq: Every evening | SUBCUTANEOUS | 0 refills | Status: DC
Start: 2021-08-26 — End: 2021-12-18
  Filled 2021-08-26 – 2021-10-13 (×2): qty 15, 50d supply, fill #0

## 2021-08-26 MED ORDER — RYBELSUS 3 MG PO TABS
3.0000 mg | ORAL_TABLET | Freq: Every day | ORAL | 0 refills | Status: DC
Start: 2021-08-26 — End: 2021-09-25
  Filled 2021-08-26: qty 30, 30d supply, fill #0

## 2021-08-26 NOTE — Telephone Encounter (Signed)
Copied from [place] [id number]. Topic: Medications/Prescriptions - Medication Question/Problem  >> Aug 26, 2021  3:42 PM Gwendalyn Mcgonagle-Parnell, Darrick Penna wrote:  The patient lost her continuous glucose sensor,   She is asking that one be sent to [facility] on [address]    She can be reached at [phone]

## 2021-08-26 NOTE — Telephone Encounter (Signed)
Copied from [place] [id number]. Topic: Medications/Prescriptions - Medication Question/Problem  >> Aug 26, 2021  3:42 PM Racer Quam-Parnell, Darrick Penna wrote:  The patient lost her continuous glucose sensor,   She is asking that one be sent to [facility] on [address]    She can be reached at [phone]

## 2021-08-26 NOTE — Telephone Encounter (Signed)
Copied from [place] [id number]. Topic: Medications/Prescriptions - Medication Question/Problem  >> Aug 26, 2021  3:42 PM Patrice Matthew-Parnell, Darrick Penna wrote:  The patient lost her continuous glucose sensor,   She is asking that one be sent to [facility] on [address]    She can be reached at [phone]

## 2021-08-28 ENCOUNTER — Other Ambulatory Visit: Payer: Self-pay

## 2021-08-30 ENCOUNTER — Other Ambulatory Visit: Payer: Self-pay | Admitting: Physician Assistant

## 2021-08-30 ENCOUNTER — Other Ambulatory Visit: Payer: Self-pay

## 2021-09-03 ENCOUNTER — Other Ambulatory Visit: Payer: Self-pay

## 2021-09-03 ENCOUNTER — Telehealth: Payer: Self-pay | Admitting: Student in an Organized Health Care Education/Training Program

## 2021-09-03 NOTE — Telephone Encounter (Signed)
Copied from [place] [id number]. Topic: Access to Care - Speak to Provider/Office Staff  >> Sep 03, 2021  3:05 PM Katherine Basset wrote:  Cindy Ochoa calling in regards to her blood sugar.    Cindy Ochoa states that she has been having some lows, last night at 47 and this morning at 80.    Cindy Ochoa is looking for some advice on next steps and how to get her blood sugar back to normal.    Please reach [place] at [phone].

## 2021-09-03 NOTE — Telephone Encounter (Signed)
Called patient.    Having low blood sugars before dinner which is usually her only meal and then only time she takes humalog. BG has been as low as 47.    Has been taking rybelsus.    Instructed patient to reduce lantus to 24 units and reduce humalog nutritional baseline to 10 units.    Reggie Pile. Mikeal Hawthorne, DO  Endocrinology Fellow Physician, PGY-5  [facility]

## 2021-09-04 ENCOUNTER — Other Ambulatory Visit: Payer: Self-pay | Admitting: Student in an Organized Health Care Education/Training Program

## 2021-09-05 ENCOUNTER — Other Ambulatory Visit: Payer: Self-pay

## 2021-09-07 ENCOUNTER — Other Ambulatory Visit: Payer: Self-pay

## 2021-09-08 ENCOUNTER — Other Ambulatory Visit: Payer: Self-pay | Admitting: Physician Assistant

## 2021-09-08 ENCOUNTER — Other Ambulatory Visit: Payer: Self-pay

## 2021-09-08 DIAGNOSIS — Z794 Long term (current) use of insulin: Secondary | ICD-10-CM

## 2021-09-08 MED ORDER — ADMELOG SOLOSTAR 100 UNIT/ML SC SOPN
2.0000 [IU] | PEN_INJECTOR | Freq: Every day | SUBCUTANEOUS | 3 refills | Status: DC
Start: 2021-09-08 — End: 2021-10-13
  Filled 2021-09-08: qty 15, 88d supply, fill #0
  Filled 2021-10-13: qty 15, 88d supply, fill #1

## 2021-09-09 ENCOUNTER — Encounter: Payer: Self-pay | Admitting: Gastroenterology

## 2021-09-10 ENCOUNTER — Other Ambulatory Visit: Payer: Self-pay

## 2021-09-11 ENCOUNTER — Other Ambulatory Visit: Payer: Self-pay

## 2021-09-11 ENCOUNTER — Encounter: Payer: Self-pay | Admitting: Gastroenterology

## 2021-09-11 ENCOUNTER — Ambulatory Visit: Payer: Medicaid Other | Admitting: Optometry

## 2021-09-11 DIAGNOSIS — H50111 Monocular exotropia, right eye: Secondary | ICD-10-CM

## 2021-09-11 DIAGNOSIS — H5010 Unspecified exotropia: Secondary | ICD-10-CM

## 2021-09-11 DIAGNOSIS — E119 Type 2 diabetes mellitus without complications: Secondary | ICD-10-CM

## 2021-09-11 DIAGNOSIS — H5440 Blindness, one eye, unspecified eye: Secondary | ICD-10-CM

## 2021-09-11 NOTE — Progress Notes (Signed)
Patient reached out to CM in regards to her insulin. Patient stated she requested a refill for her insulin medication however, didn't hear back from providers office. CM then notified patients providers team regarding this concern. Patients team contacted the pharmacy and confirmed the prescription is now ready for patient. CM then reached out and updated patient on this information.
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
## 2021-09-16 DIAGNOSIS — H5213 Myopia, bilateral: Secondary | ICD-10-CM

## 2021-09-16 DIAGNOSIS — H52223 Regular astigmatism, bilateral: Secondary | ICD-10-CM

## 2021-09-17 ENCOUNTER — Ambulatory Visit: Payer: Medicaid Other | Admitting: Plastic Surgery

## 2021-09-19 ENCOUNTER — Encounter: Payer: Self-pay | Admitting: Physical Medicine and Rehabilitation

## 2021-09-19 ENCOUNTER — Ambulatory Visit
Payer: Medicaid Other | Attending: Physical Medicine and Rehabilitation | Admitting: Physical Medicine and Rehabilitation

## 2021-09-19 ENCOUNTER — Other Ambulatory Visit: Payer: Self-pay

## 2021-09-19 VITALS — BP 131/88 | HR 124 | Temp 97.3°F | Wt 140.0 lb

## 2021-09-19 DIAGNOSIS — M62838 Other muscle spasm: Secondary | ICD-10-CM | POA: Insufficient documentation

## 2021-09-19 MED ORDER — ONABOTULINUMTOXINA 100 UNIT IJ SOLR *I*
100.0000 [IU] | Freq: Once | INTRAMUSCULAR | Status: AC | PRN
Start: 2021-09-19 — End: 2021-09-19
  Administered 2021-09-19: 100 [IU] via INTRAMUSCULAR

## 2021-09-19 NOTE — Procedures (Signed)
PMR Botox Injections  Performed by: Alena Bills, MD  Authorized by: Alena Bills, MD     Date/time: 09/19/2021 12:45 PM EDT  Injections:  64332 - Chemodenervation 1 ext 1-4 muscles and 64643 - Chemodenervation 1 ext 1-4 muscles, 1st addtl  Medication:  100 units botulinum toxin type A 100 units; 100 units botulinum toxin type A 100 units; 100 units botulinum toxin type A 100 units; 100 units botulinum toxin type A 100 units; 100 units botulinum toxin type A 100 units; 100 units botulinum toxin type A 100 units   See below    Procedure Details   The risks, benefits, indications, potential complications, and alternatives were explained to the patient and/or guardian who verbalized understanding and informed consent obtained.     The area for injection was identified and a time out called to re-identify.      The correct patient was identified with 2 identifiers The correct procedure, location site(s) and laterality were identified with the patient and/or guardian and correspond to the consent form  The appropriate site was marked and verified.  The patient was placed in the correct position.    After prepping the skin with alcohol overlying the following muscles, botlinum toxin was injected intramuscularly as follows.     Right and left quadriceps (vastus lateralis 75, vastus medialis 75, vastus intermedius 75, rectus femoris 75) 300 + 300 units      Botulinum toxin Lot #: [id number]  Botulinum toxin expiration date: 11/2023  Total botox units injected: 600 units   Total botox units wasted: 0 units     The patient tolerated the procedure without complications.    Patient will follow up in 12 weeks for repeat botox injection     Alena Bills, MD

## 2021-09-25 ENCOUNTER — Encounter: Payer: Self-pay | Admitting: Physician Assistant

## 2021-09-25 ENCOUNTER — Ambulatory Visit: Payer: Medicaid Other | Attending: Physician Assistant | Admitting: Physician Assistant

## 2021-09-25 ENCOUNTER — Other Ambulatory Visit: Payer: Self-pay

## 2021-09-25 VITALS — BP 102/57 | HR 115

## 2021-09-25 DIAGNOSIS — E119 Type 2 diabetes mellitus without complications: Secondary | ICD-10-CM | POA: Insufficient documentation

## 2021-09-25 LAB — POCT HEMOGLOBIN A1C: Hemoglobin A1C,POC: 9.9 % — ABNORMAL HIGH

## 2021-09-25 MED ORDER — RYBELSUS 3 MG PO TABS
3.0000 mg | ORAL_TABLET | Freq: Every day | ORAL | 0 refills | Status: DC
Start: 2021-09-25 — End: 2021-10-13

## 2021-09-25 MED ORDER — GLUCAGON 3 MG/DOSE NA POWD *I*
NASAL | 3 refills | Status: DC
Start: 2021-09-25 — End: 2022-08-10
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
## 2021-09-27 ENCOUNTER — Encounter: Payer: Self-pay | Admitting: Internal Medicine

## 2021-10-06 ENCOUNTER — Ambulatory Visit: Payer: Medicaid Other | Admitting: Internal Medicine

## 2021-10-07 ENCOUNTER — Other Ambulatory Visit: Payer: Self-pay | Admitting: Internal Medicine

## 2021-10-07 ENCOUNTER — Other Ambulatory Visit: Payer: Self-pay

## 2021-10-07 ENCOUNTER — Encounter: Payer: Self-pay | Admitting: Gastroenterology

## 2021-10-07 DIAGNOSIS — G8254 Quadriplegia, C5-C7 incomplete: Secondary | ICD-10-CM

## 2021-10-07 NOTE — Progress Notes (Signed)
dexa sc

## 2021-10-07 NOTE — Progress Notes (Signed)
Today CM reached out to patient and asked if she would like to reschedule her recently missed [facility] appointment. CM then reached out to team PSS and requested this appointment to be rescheduled. PSS was able to reschedule patient for 10/17. CM then updated patient. Patient also notified CM that she needed to complete a Dexa scan and was having difficulty scheduling it. CM then called [facility] to schedule this. Almyra Deforest stated they needed a new order from patients PCP. CM then reached out to patients provider and requested a new order. Patients provider sent new order and updated CM. CM then scheduled patient for her Dexa scan on 12/19. CM then updated patient on this information.

## 2021-10-13 ENCOUNTER — Other Ambulatory Visit: Payer: Self-pay

## 2021-10-13 ENCOUNTER — Ambulatory Visit: Payer: Medicaid Other | Attending: Internal Medicine | Admitting: Internal Medicine

## 2021-10-13 ENCOUNTER — Encounter: Payer: Self-pay | Admitting: Internal Medicine

## 2021-10-13 VITALS — BP 117/68 | HR 88 | Temp 97.5°F

## 2021-10-13 DIAGNOSIS — G8254 Quadriplegia, C5-C7 incomplete: Secondary | ICD-10-CM

## 2021-10-13 DIAGNOSIS — Z23 Encounter for immunization: Secondary | ICD-10-CM

## 2021-10-13 DIAGNOSIS — E119 Type 2 diabetes mellitus without complications: Secondary | ICD-10-CM

## 2021-10-13 DIAGNOSIS — K592 Neurogenic bowel, not elsewhere classified: Secondary | ICD-10-CM

## 2021-10-13 DIAGNOSIS — Z794 Long term (current) use of insulin: Secondary | ICD-10-CM | POA: Insufficient documentation

## 2021-10-13 DIAGNOSIS — K5909 Other constipation: Secondary | ICD-10-CM

## 2021-10-13 MED ORDER — FREESTYLE LITE TEST VI STRP *A*
ORAL_STRIP | Freq: Four times a day (QID) | 1 refills | Status: DC
Start: 2021-10-13 — End: 2022-01-13
  Filled 2021-10-13: qty 100, 25d supply, fill #0
  Filled 2021-12-08: qty 100, 25d supply, fill #1

## 2021-10-13 MED ORDER — SENNOSIDES 8.6 MG PO TABS *I*
2.0000 | ORAL_TABLET | Freq: Two times a day (BID) | ORAL | 2 refills | Status: DC
Start: 2021-10-13 — End: 2021-12-19
  Filled 2021-10-13: qty 120, 30d supply, fill #0
  Filled 2021-12-19: qty 120, 30d supply, fill #1

## 2021-10-13 MED ORDER — BISACODYL 10 MG RE SUPP *I*
10.0000 mg | Freq: Every day | RECTAL | 3 refills | Status: DC | PRN
Start: 2021-10-13 — End: 2021-12-19
  Filled 2021-10-13: qty 30, 30d supply, fill #0

## 2021-10-13 MED ORDER — FREESTYLE LANCETS MISC *A*
1 refills | Status: DC
Start: 2021-10-13 — End: 2022-04-07
  Filled 2021-10-13: qty 100, 25d supply, fill #0

## 2021-10-13 MED ORDER — ADMELOG SOLOSTAR 100 UNIT/ML SC SOPN
2.0000 [IU] | PEN_INJECTOR | Freq: Every day | SUBCUTANEOUS | 3 refills | Status: DC
Start: 2021-10-13 — End: 2022-04-11
  Filled 2021-10-13 – 2021-12-18 (×2): qty 15, 88d supply, fill #0

## 2021-10-13 MED ORDER — METFORMIN HCL 500 MG PO TABS *I*
500.0000 mg | ORAL_TABLET | Freq: Two times a day (BID) | ORAL | 5 refills | Status: DC
Start: 2021-10-13 — End: 2022-04-07
  Filled 2021-10-13: qty 60, 30d supply, fill #0

## 2021-10-13 MED ORDER — GLUCOSE 40 % PO GEL *I*
15.0000 g | Freq: Once | ORAL | 2 refills | Status: DC | PRN
Start: 2021-10-13 — End: 2022-03-06
  Filled 2021-10-13: qty 2, 5d supply, fill #0

## 2021-10-13 MED ORDER — ALCOHOL PREP 70 % PADS *I*
MEDICATED_PAD | 1 refills | Status: DC
Start: 2021-10-13 — End: 2022-04-07
  Filled 2021-10-13: qty 100, 25d supply, fill #0

## 2021-10-13 NOTE — Progress Notes (Signed)
Reason For Visit: The primary encounter diagnosis was Quadriplegia, C5-C7, incomplete. Diagnoses of Diabetes, Diabetes mellitus, Type 2 diabetes mellitus without complication, with long-term current use of insulin, Constipation, chronic, and Neurogenic bowel were also pertinent to this visit.      HPI:  Cindy Ochoa is 35 y.o. year old female with quad c5-7, depression, constipation, dm,neurogenic bladder,     Quad  -Motorize wheelchair  -Has aid service  -Skin is good right now    Neurogenic bladder  osteomy bag  Has not seen urology in sometime    Constipation  -Uses senna and sup  -Has bowel movement every few days    Diabetes  -Follow by endocrine  -She has not restarted rybelsus  -Reports hypoglycemia when she on that medications  -She was to reduce insulin when starting  -Reports she would rather just take insulin and metformin  -She is working on diet and would like to get Elenor Legato so that she does not have to stick herself so often     Willing to get covid and flu shot today     dexa scan reschedule for December    Son is senior this year in high school     Medications:     Current Outpatient Medications   Medication Sig    FreeStyle Lancets (FREESTYLE) Use 4 lancets per day.    metFORMIN (GLUCOPHAGE) 500 mg tablet Take 1 tablet (500 mg total) by mouth 2 times daily (with meals)    dextrose (GLUTOSE) 40 % oral gel Take 1 tube (15 g total) by mouth once as needed for Low blood sugar for up to 1 dose    insulin lispro (ADMELOG SOLOSTAR) 100 UNIT/ML injection pen Inject 2-10 units into the skin daily before dinner. Maximum: 17 Units/day. Discard pen 28 days after first use.   LISS 1:40 >140, nb 10 (only with dinner)    senna (SENOKOT) 8.6 mg tablet Take 2 tablets by mouth 2 times daily    bisacodyl (DULCOLAX) 10 mg suppository Unwrap and insert 1 suppository (10 mg total) rectally daily as needed    blood glucose (FREESTYLE LITE) test strip Use 4 test strips per day.    Alcohol Swabs (ALCOHOL PREP) 70 %  PADS Use 4 times a day as directed    glucagon (BAQSIMI) 3 MG/DOSE nasal powder Inhale one dose (3 mg) into one nostril once as needed for low blood sugar. If no response in 15 min, inhale second dose from new device    insulin glargine-yfgn (SEMGLEE) 100 UNIT/ML injection pen Inject 30 units into the skin nightly  for Type 2 Diabetes    insulin pen needle (BD PEN NEEDLE NANO U/F) 32G X 4 MM Use three times a day as instructed.    insulin pen needle (BD ULTRA-FINE PEN NEEDLE NANO) 32G X 4 MM Use three times a day as instructed.    acetaminophen (MAPAP) 500 mg tablet Take 2 tablets (1,000 mg total) by mouth every 8 hours as needed for Pain or Fever  for pain    melatonin 3 mg Take 1 tablet (3 mg total) by mouth nightly as needed for Sleep    Blood Glucose Monitoring Suppl (FREESTYLE LITE) w/Device KIT Use to test blood sugar as directed    generic DME Dispense: chux pads   ICD-10: N39.41  Duration: Lifetime  Ht Readings from Last 1 Encounters:  04/16/21 : 1.626 m (5' 4" )   Wt Readings from Last 1 Encounters:  04/16/21 : 61.2 kg (  135 lb)    generic DME Dispense: briefs   ICD-10: N39.41  Duration: Lifetime  Ht Readings from Last 1 Encounters:  04/16/21 : 1.626 m (5' 4" )   Wt Readings from Last 1 Encounters:  04/16/21 : 61.2 kg (135 lb)    Non-System Medication Chux pads  Ht: 5' 5"  Wt: 143lbs  ICD code: N39.41    Non-System Medication Briefs/pullups, large  Ht: 5' 5"  Wt: 143lbs  ICD code: N39.41    Non-System Medication Motorized wheelchair-   Lifetime 99  Quad c-6    Non-System Medication Nebulizer with tubing and mask  RO 689  Respiratory insufficiency    Non-System Medication Portable suction  respiratory insufficiency  Tracheotomy   Quadriparesis    Gauze Pads & Dressings (POLYMEM TRACH DRESSING) 3-1/2"X3-1/2" PADS Use as needed    generic DME Suction machine with all related supplies.    Non-System Medication Large underpads  Ht: 5' 5"  Wt: 143lbs  ICD code: N39.41    generic DME 1/2 inch plain  packing gauze    generic DME Dispense: ROHO  Indication: Quadriplegia   ICD-10: R53.2  Duration: Lifetime  Ht Readings from Last 1 Encounters:  03/19/20 : 1.626 m (5' 4" )   Wt Readings from Last 1 Encounters:  03/19/20 : 63.5 kg (140 lb)    hydrocortisone (ANUSOL-HC) 25 MG suppository Place 1 suppository rectally  every other night    Non-System Medication Gloves  Ht: 5' 5"  Wt: 143lbs  ICD code: N39.41    disposable underpads 30"x36" (CHUX) Use 6 times daily and PRN. Dx N39.42  Incontinence without sensory awareness    incontinence supply disposable Large pull ups - use up to 5 x daily  Dx N39.46    disposable gloves 1 box Dynarex PF Vinyl Gloves    patient lift Use as directed for patient lifting. For lifetime use; ICD 10: G82.54 Ht: 1.25mWt: 76.2 kg    adjustable bath/shower seat with back For lifetime use; ICD 10: G82.54 Ht: 1.647mt: 76.2 kg    Non-System Medication Urostomy drainage bags 2000 cc change as needed. Dx N39.46 and  G82.54    generic DME Wafers  Dx code: N31.9  Qty: 10  Refills: 6    generic DME Urostomy Pouches  Dx Codes(s): N31.9  Qty: 20  Refills: 6    generic DME Leg bag  Dx Code: N31.9  Qty: 4  Refills: 6    generic DME Night drainage bag  Dx Code: N31.9  Qty: 4  Refills: 6    generic DME Barrier rings  Dx Code: N31.9  Qty: 15  Refills: 6    Ostomy Supplies [employer]. [employer] Pontrostomy two piece bag and wafer 1 1/4 "H  7/8 "V N31.9 Neuromuscular dysfunction of the bladder    [facility] two piece bag and wafer 1 1/4 "H  7/8 "V N31.9 Neuromuscular dysfunction of the bladder. Use as directed.    Ostomy Supplies Pouch MIE. I. du Pontrostomy two piece bag and wafer 1 1/4 "H  7/8 "V N31.9 Neuromuscular dysfunction of the bladder Use as directed    Ostomy Supplies MISC Barrier ring. Use as directed.    generic DME Repairs to hospital bed/hoyer lift  ICD 10: G82.54   Ht: 1.6576m: 76.2 kg    disposable gloves 2 boxes Disposable Medium size gloves N39.46     generic DME Urostomy drainage bags 2000 cc change as needed. Dx N39.46 and  G82.54    Gauze Pads &  Dressings (ABDOMINAL PAD) 8"X10" PADS By 1 each no specified route daily   Cover buttock wounds 2x daily    Non-System Medication Gel overlay mattress for hospital bed - diagnosis G82.54, L89.93, L89.159    Non-System Medication Easy Tip Leg Bags 103m - diagnosis G82.53 N39.46    etonogestrel (NEXPLANON) 68 MG IMPL Inject 68 mg into the skin once   Placed 10/20/16     No current facility-administered medications for this visit.       Medication list reconciled this visit    Allergies:     Allergies   Allergen Reactions    Heparin Other (See Comments)     Thrombocytopenia; HIT    Ibuprofen Swelling     Pt reports lip swelling after taking ibuprofen.     Nitrofurantoin Nausea And Vomiting       Social history      Social History     Tobacco Use    Smoking status: Former Smoker     Packs/day: 0.25     Years: 3.00     Pack years: 0.75     Types: Cigarettes     Start date: 12/09/2011    Smokeless tobacco: Former [employer] developer    Quit date: 04/29/2005    Tobacco comment: 3 cigarettes a day   Substance Use Topics    Alcohol use: Not Currently       Review of Systems     10 point review of system is negative unless discussed in subjective     Physical Exam:     Physical Exam  Constitutional:       Comments: Motorized wheel chair    Cardiovascular:      Rate and Rhythm: Normal rate and regular rhythm.   Pulmonary:      Effort: Pulmonary effort is normal.   Abdominal:      Comments: Urostomy tube                   Vitals:    10/13/21 1437   BP: 117/68   Pulse: 88   Temp: 36.4 C (97.5 F)   TempSrc: Temporal   SpO2: 97%     Wt Readings from Last 3 Encounters:   09/19/21 63.5 kg (140 lb)   08/20/21 63.5 kg (140 lb)   07/17/21 62.1 kg (137 lb)     BP Readings from Last 3 Encounters:   10/13/21 117/68   09/25/21 102/57   09/19/21 131/88           RESULTS:     Reviewed all pertain lab and xray reports during the visit      ASSESSMENT/PLAN:     Diagnoses and all orders for this visit:    Quadriplegia, C5-C7, incomplete  Watch for skin breakdown   Followed by physical medicione    Diabetes  -does not what to take oral glp-1  -continue insulin long acting, meal time with sliding scale and metformin   -     FreeStyle Lancets (FREESTYLE); Use 4 lancets per day.  -     blood glucose (FREESTYLE LITE) test strip; Use 4 test strips per day.  -     Alcohol Swabs (ALCOHOL PREP) 70 % PADS; Use 4 times a day as directed  -     insulin lispro (ADMELOG SOLOSTAR) 100 UNIT/ML injection pen; Inject 2-10 units into the skin daily before dinner. Maximum: 17 Units/day. Discard pen 28 days after first use.   LISS 1:40 >140, nb 10 (  only with dinner)  -     metFORMIN (GLUCOPHAGE) 500 mg tablet; Take 1 tablet (500 mg total) by mouth 2 times daily (with meals)  -     dextrose (GLUTOSE) 40 % oral gel; Take 1 tube (15 g total) by mouth once as needed for Low blood sugar for up to 1 dose    Constipation, chronic  -     senna (SENOKOT) 8.6 mg tablet; Take 2 tablets by mouth 2 times daily  -     bisacodyl (DULCOLAX) 10 mg suppository; Unwrap and insert 1 suppository (10 mg total) rectally daily as needed    Neurogenic bowel- follow up with urology      I personally spent 35 minutes  on the calendar day of the encounter, including pre and post visit work.        Patient instructions      Patient Instructions   [facility]   [phone]       Bring glucometer to every appointment    Get your flu and covid vaccine at check out today                 Signed by Sherlon Handing, NP [facility]10/17/20223:11 PM

## 2021-10-13 NOTE — Patient Instructions (Addendum)
[facility]   [phone]       Bring glucometer to every appointment    Get your flu and covid vaccine at check out today

## 2021-10-20 ENCOUNTER — Encounter: Payer: Self-pay | Admitting: Internal Medicine

## 2021-10-20 ENCOUNTER — Other Ambulatory Visit: Payer: Self-pay

## 2021-10-20 DIAGNOSIS — K592 Neurogenic bowel, not elsewhere classified: Secondary | ICD-10-CM

## 2021-10-20 DIAGNOSIS — N319 Neuromuscular dysfunction of bladder, unspecified: Secondary | ICD-10-CM

## 2021-10-20 NOTE — Addendum Note (Signed)
Addended by: Gwenith Spitz on: 10/20/2021 05:09 PM     Modules accepted: Orders

## 2021-10-21 ENCOUNTER — Other Ambulatory Visit: Payer: Self-pay

## 2021-10-21 ENCOUNTER — Encounter: Payer: Self-pay | Admitting: Gastroenterology

## 2021-10-21 ENCOUNTER — Encounter: Payer: Self-pay | Admitting: Internal Medicine

## 2021-10-21 ENCOUNTER — Ambulatory Visit: Payer: Medicaid Other

## 2021-10-21 NOTE — Progress Notes (Signed)
Patient reached out to CM and notified CM she needed to get scheduled to see Urology. CM then reached out to patients provider and requested a referral to be sent to [facility]. Patients provider then notified CM the referral has been placed. CM then contacted Urology and scheduled patient for 1/3. CM also put patient on there cancellation list. CM then contacted patient and updated her on this information.

## 2021-10-24 ENCOUNTER — Ambulatory Visit: Payer: Medicaid Other | Admitting: Student in an Organized Health Care Education/Training Program

## 2021-10-31 ENCOUNTER — Ambulatory Visit: Payer: Medicaid Other | Admitting: Otolaryngology

## 2021-10-31 ENCOUNTER — Ambulatory Visit: Payer: Medicaid Other | Admitting: Student in an Organized Health Care Education/Training Program

## 2021-11-01 ENCOUNTER — Other Ambulatory Visit: Payer: Self-pay

## 2021-11-01 ENCOUNTER — Ambulatory Visit: Payer: Medicaid Other | Attending: General Orthopedics | Admitting: General Orthopedics

## 2021-11-01 VITALS — BP 108/70 | HR 113 | Temp 96.8°F | Resp 24

## 2021-11-01 DIAGNOSIS — J189 Pneumonia, unspecified organism: Secondary | ICD-10-CM | POA: Insufficient documentation

## 2021-11-01 DIAGNOSIS — Z20822 Contact with and (suspected) exposure to covid-19: Secondary | ICD-10-CM | POA: Insufficient documentation

## 2021-11-01 DIAGNOSIS — Z20828 Contact with and (suspected) exposure to other viral communicable diseases: Secondary | ICD-10-CM | POA: Insufficient documentation

## 2021-11-01 MED ORDER — DOXYCYCLINE HYCLATE 100 MG PO TABS *I*
100.0000 mg | ORAL_TABLET | Freq: Two times a day (BID) | ORAL | 0 refills | Status: DC
Start: 2021-11-01 — End: 2021-11-06

## 2021-11-01 MED ORDER — AMOXICILLIN-POT CLAVULANATE 875-125 MG PO TABS *I*
1.0000 | ORAL_TABLET | Freq: Two times a day (BID) | ORAL | 0 refills | Status: AC
Start: 2021-11-01 — End: 2021-11-06

## 2021-11-01 NOTE — Patient Instructions (Signed)
Please call your physician as soon as possible  Seek care/go to the emergency room if worsening or not improving.    If you were tested for COVID, you should isolate until you get your COVID test results. If your test is positive, you will need to continue isolation until cleared by the Department of Health. For more details about this, please see the attached documents.     We will no longer contact patients whose COVID test is positive and have a [facility] MyChart account. Your result will be available to you at the same time it is to [place].    We will not contact any patients whose COVID test is negative. We suggest you sign up for MyChart to see your result. If you do not have a MyChart account, there will be instructions attached.
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
## 2021-11-02 LAB — COVID-19 NAAT (PCR): COVID-19 NAAT (PCR): NEGATIVE

## 2021-11-02 LAB — GRAM STAIN

## 2021-11-03 LAB — LEGIONELLA CULTURE: Legionella Culture: 0

## 2021-11-03 LAB — INFLUENZA B PCR: Influenza B PCR: 0

## 2021-11-03 LAB — AEROBIC CULTURE: Aerobic Culture: 0

## 2021-11-03 LAB — RSV PCR: RSV PCR: 0

## 2021-11-03 LAB — INFLUENZA A: Influenza A PCR: 0

## 2021-11-04 ENCOUNTER — Other Ambulatory Visit: Payer: Self-pay

## 2021-11-04 DIAGNOSIS — J989 Respiratory disorder, unspecified: Secondary | ICD-10-CM

## 2021-11-04 NOTE — Progress Notes (Signed)
CXR ordered, patient recently visited UC, however they were unable to get a CXR there as they could not accommodate [place].

## 2021-11-06 ENCOUNTER — Encounter: Payer: Self-pay | Admitting: Plastic Surgery

## 2021-11-06 ENCOUNTER — Other Ambulatory Visit: Payer: Self-pay

## 2021-11-06 NOTE — Progress Notes (Signed)
Patient reached out to CM and requested assistance with getting a chest x-ray done. CM reached out to patients providers who were able to complete an order for a chest x-ray. CM then contacted [facility] who notified CM that the imaging departments are only doing walk-in appointments. CM reached out to patient and notified her of this. Patient stated she would be able to complete the x-ray on 11/14 in the morning. Patients provider also wanted patient to follow up at [facility]. CM reached out to team PSS who was able to schedule patient for 11/14 at 8:30am. CM updated patient and provided her with appointment details. Patient stated she is set with transportation and will notify CM with any concerns.

## 2021-11-10 ENCOUNTER — Other Ambulatory Visit: Payer: Self-pay

## 2021-11-10 ENCOUNTER — Encounter: Payer: Self-pay | Admitting: Internal Medicine

## 2021-11-10 ENCOUNTER — Ambulatory Visit: Payer: Medicaid Other | Admitting: Internal Medicine

## 2021-11-10 VITALS — BP 118/64 | HR 105 | Temp 97.2°F

## 2021-11-10 DIAGNOSIS — G8254 Quadriplegia, C5-C7 incomplete: Secondary | ICD-10-CM

## 2021-11-10 DIAGNOSIS — Z794 Long term (current) use of insulin: Secondary | ICD-10-CM

## 2021-11-10 DIAGNOSIS — J189 Pneumonia, unspecified organism: Secondary | ICD-10-CM

## 2021-11-10 DIAGNOSIS — R221 Localized swelling, mass and lump, neck: Secondary | ICD-10-CM

## 2021-11-10 DIAGNOSIS — E119 Type 2 diabetes mellitus without complications: Secondary | ICD-10-CM

## 2021-11-10 MED ORDER — MUPIROCIN 2 % EX OINT *I*
TOPICAL_OINTMENT | Freq: Two times a day (BID) | CUTANEOUS | 1 refills | Status: DC
Start: 2021-11-10 — End: 2022-04-07

## 2021-11-10 NOTE — Progress Notes (Signed)
Thrombocytopenia; HIT   . Ibuprofen Swelling     Pt reports lip swelling after taking ibuprofen.    . Nitrofurantoin Nausea And Vomiting       Social history      Social History     Tobacco Use   . Smoking status: Former     Packs/day: 0.25     Years: 3.00     Pack years: 0.75     Types: Cigarettes     Start date: 12/09/2011   . Smokeless tobacco: Former     Quit date: 04/29/2005   . Tobacco comments:     3 cigarettes a day   Substance Use Topics   . Alcohol use: Not Currently       Review of Systems     10 point review of system is negative unless discussed in subjective     Physical Exam:     Physical Exam           Vitals:    11/10/21 0834   BP: 118/64   BP Location: Left arm   Patient Position: Sitting   Cuff Size: adult   Pulse: 105   Temp: 36.2 C (97.2 F)   SpO2: 95%     Wt Readings from Last 3 Encounters:   09/19/21 63.5 kg (140 lb)   08/20/21 63.5 kg (140 lb)   07/17/21 62.1 kg (137 lb)     BP Readings from Last 3 Encounters:   11/10/21 118/64   11/01/21 108/70   10/13/21 117/68           RESULTS:     Reviewed all pertain lab and xray reports during the visit     ASSESSMENT/PLAN:     Diagnoses and all orders for this visit:    Pneumonia, unspecified organism    Lump in neck    Other orders  -     mupirocin (BACTROBAN) 2 % ointment; Apply topically 2 times daily  for Hair Follicle Inflammation to the following areas: cyst neck            Follow up for Follow up with ccp january .     I personally spent 35 minutes  on the calendar day of the encounter, including pre and post visit work.    Medication***      Patient instructions      There are no Patient Instructions on file for this visit.       Signed by Genia Hotter, NP [facility]11/14/20228:54 AM

## 2021-12-08 ENCOUNTER — Other Ambulatory Visit: Payer: Self-pay

## 2021-12-12 ENCOUNTER — Ambulatory Visit: Payer: Medicaid Other | Admitting: Physical Medicine and Rehabilitation

## 2021-12-15 ENCOUNTER — Other Ambulatory Visit: Payer: Medicaid Other

## 2021-12-15 ENCOUNTER — Other Ambulatory Visit: Payer: Self-pay

## 2021-12-15 ENCOUNTER — Ambulatory Visit
Admission: RE | Admit: 2021-12-15 | Discharge: 2021-12-15 | Disposition: A | Discharge status: Home / Self Care | Payer: Medicaid Other | Source: Ambulatory Visit

## 2021-12-15 DIAGNOSIS — G8254 Quadriplegia, C5-C7 incomplete: Secondary | ICD-10-CM

## 2021-12-15 DIAGNOSIS — M818 Other osteoporosis without current pathological fracture: Secondary | ICD-10-CM

## 2021-12-17 ENCOUNTER — Other Ambulatory Visit: Payer: Self-pay

## 2021-12-17 ENCOUNTER — Encounter: Payer: Self-pay | Admitting: Gastroenterology

## 2021-12-17 NOTE — Progress Notes (Signed)
Person Outreached to: Patient  Result of attempt: CM spoke with patient.   Details of communication: CM reached out to patient and made contact. Patient stated she is doing well and has no current concerns. CM and patient then reviewed her upcoming appointments. Patient has a Urology appointment on 1/3, and a PT appointment on 1/25. Patient stated she will attend these appointments and her home aid nurse will provide transportation for her.    Follow Up Plan:Patient will notify CM with any concerns or questions that come up.

## 2021-12-18 ENCOUNTER — Other Ambulatory Visit: Payer: Self-pay | Admitting: Internal Medicine

## 2021-12-18 ENCOUNTER — Other Ambulatory Visit: Payer: Self-pay | Admitting: Neurosurgery

## 2021-12-18 ENCOUNTER — Other Ambulatory Visit: Payer: Self-pay

## 2021-12-18 DIAGNOSIS — E119 Type 2 diabetes mellitus without complications: Secondary | ICD-10-CM

## 2021-12-18 NOTE — Telephone Encounter (Signed)
Dr. Shearon Stalls unavailable. Refill request routed to today's grouper Dr. Herminio Commons for processing 12/18/2021 12:04 PM    If appropriate, please refill the script for 90 days.

## 2021-12-18 NOTE — Telephone Encounter (Signed)
Copied from [place] #[id number]. Topic: Medications/Prescriptions - Prescription Status/Information  >> Dec 18, 2021  8:56 AM Brendia Sacks wrote:  Ms. Wanless is calling to check the status of her medication refill request for     Glucose Blood .     Writer told her that [facility] did receive the request from [facility].

## 2021-12-19 ENCOUNTER — Other Ambulatory Visit: Payer: Self-pay

## 2021-12-19 ENCOUNTER — Other Ambulatory Visit: Payer: Self-pay | Admitting: Internal Medicine

## 2021-12-19 DIAGNOSIS — K5909 Other constipation: Secondary | ICD-10-CM

## 2021-12-19 MED ORDER — SENNOSIDES 8.6 MG PO TABS *I*
2.0000 | ORAL_TABLET | Freq: Two times a day (BID) | ORAL | 2 refills | Status: AC
Start: 2021-12-19 — End: 2022-03-19
  Filled 2021-12-19: qty 120, 30d supply, fill #0

## 2021-12-19 MED ORDER — BISACODYL 10 MG RE SUPP *I*
10.0000 mg | Freq: Every day | RECTAL | 3 refills | Status: DC | PRN
Start: 2021-12-19 — End: 2022-04-11
  Filled 2021-12-19: qty 30, 30d supply, fill #0

## 2021-12-19 NOTE — Telephone Encounter (Signed)
Patient's mother came in to clinic to request refill of [facility] due to patient being very constipated.

## 2021-12-19 NOTE — Telephone Encounter (Signed)
Dr. Shearon Stalls unavailable. Refill request routed to today's grouper Dr. Darlen Round for processing 12/19/2021 10:59 AM    If appropriate, please refill the script for 90 days.   Shearon Stalls

## 2021-12-22 ENCOUNTER — Encounter: Payer: Self-pay | Admitting: Internal Medicine

## 2021-12-23 ENCOUNTER — Other Ambulatory Visit: Payer: Self-pay

## 2021-12-23 MED ORDER — INSULIN GLARGINE-YFGN 100 UNIT/ML SC SOPN *WRAPPED*
30.0000 [IU] | PEN_INJECTOR | Freq: Every evening | SUBCUTANEOUS | 1 refills | Status: DC
Start: 2021-12-23 — End: 2022-04-11
  Filled 2021-12-23: qty 15, 50d supply, fill #0

## 2021-12-26 ENCOUNTER — Other Ambulatory Visit: Payer: Self-pay | Admitting: Urology

## 2021-12-26 DIAGNOSIS — R3989 Other symptoms and signs involving the genitourinary system: Secondary | ICD-10-CM

## 2021-12-28 ENCOUNTER — Other Ambulatory Visit: Payer: Self-pay

## 2021-12-30 ENCOUNTER — Other Ambulatory Visit: Payer: Self-pay

## 2021-12-30 ENCOUNTER — Encounter: Payer: Self-pay | Admitting: Gastroenterology

## 2021-12-30 ENCOUNTER — Encounter: Payer: Self-pay | Admitting: Urology

## 2021-12-30 ENCOUNTER — Ambulatory Visit: Payer: Medicaid Other | Attending: Urology | Admitting: Urology

## 2021-12-30 VITALS — Ht 65.0 in | Wt 145.0 lb

## 2021-12-30 DIAGNOSIS — N319 Neuromuscular dysfunction of bladder, unspecified: Secondary | ICD-10-CM

## 2021-12-30 DIAGNOSIS — N390 Urinary tract infection, site not specified: Secondary | ICD-10-CM | POA: Insufficient documentation

## 2021-12-30 MED ORDER — CEPHALEXIN 500 MG PO CAPS *I*
500.0000 mg | ORAL_CAPSULE | Freq: Three times a day (TID) | ORAL | 1 refills | Status: DC
Start: 2021-12-30 — End: 2022-02-16
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
## 2021-12-30 NOTE — Patient Instructions (Signed)
Your provider has requested you follow up with imaging.      You can schedule your appointment at your own convenience and at the location of your choosing by calling [facility] at the number below.  The imaging facility will reach out to your insurance company to request authorization to complete this testing.     If you do not schedule the imaging yourself you will be contacted with an appointment date and time, if the appointment given to you does not work you will need to contact radiology yourself to reschedule.     *If imaging is not needed for several months you may not be contacted until closer to that date    X Rays do not need to be scheduled - these are walk in.     [facility]  [phone]

## 2021-12-30 NOTE — Progress Notes (Signed)
Person Outreached to: Patient   Result of attempt: CM met with patient in person   Details of communication:Today CM and patient met before her Urology appointment. CM and patient both reviewed and updated patients care plan and crisis plan. No current changes were required for both plans and patient is in agreement with current plans. Patient also stated she would like to meet with someone to discuss her diabetes concerns. CM then reached out to team PSS requesting patient to be scheduled with CCP in order to further discuss her concerns.   Follow Up Plan:CM will update patient once she is scheduled with CCP.

## 2022-01-02 ENCOUNTER — Telehealth: Payer: Self-pay | Admitting: Urology

## 2022-01-02 ENCOUNTER — Other Ambulatory Visit: Payer: Self-pay

## 2022-01-02 LAB — AEROBIC CULTURE

## 2022-01-05 ENCOUNTER — Telehealth: Payer: Self-pay | Admitting: Urology

## 2022-01-05 NOTE — Telephone Encounter (Signed)
Urology Nursing     Please inform patient her urine culture last week was + and she was started on appropriate / culture sensitive abx.     Hopefully she is feeling better with treatment.     We will follow-up on her renal US which is scheduled for next month.       Also Jemel  see my plan for follow-up:    RV in 2024 Spring / Summer.  She wants to avoid the winter for a 1 year follow-up.       If you can schedule. If not I asekd the patient to call our office Spring and Summer to schedule a 1 year follow-up.       Please inform patient about how she can schedule for follow-up so she is not lost to followup like she has in the past.       Thanks

## 2022-01-08 NOTE — Telephone Encounter (Signed)
Sent my chart to patient. Thanks [employer]

## 2022-01-13 ENCOUNTER — Other Ambulatory Visit: Payer: Self-pay

## 2022-01-13 ENCOUNTER — Other Ambulatory Visit: Payer: Self-pay | Admitting: Internal Medicine

## 2022-01-13 ENCOUNTER — Telehealth: Payer: Self-pay | Admitting: Internal Medicine

## 2022-01-13 DIAGNOSIS — E119 Type 2 diabetes mellitus without complications: Secondary | ICD-10-CM

## 2022-01-13 MED ORDER — FREESTYLE LITE TEST VI STRP *A*
ORAL_STRIP | Freq: Four times a day (QID) | 2 refills | Status: DC
Start: 2022-01-13 — End: 2022-04-07
  Filled 2022-01-13: qty 100, 25d supply, fill #0
  Filled 2022-02-16: qty 100, 25d supply, fill #1
  Filled 2022-03-24: qty 100, 25d supply, fill #2

## 2022-01-13 NOTE — Telephone Encounter (Signed)
Refill request routed to Dr. Shearon Stalls 01/13/2022 10:01 AM

## 2022-01-13 NOTE — Telephone Encounter (Signed)
Copied from [place] [id number]. Topic: Medications/Prescriptions - Medication Question/Problem  >> Jan 13, 2022  9:33 AM Verda Cumins wrote:  Patient wants refills on her medications.  She was upset that she had to call the pharmacy for the test strips when she is diabetic and will always need test strips.    Patient contact number is [phone] [facility])

## 2022-01-13 NOTE — Telephone Encounter (Signed)
Noted. Please see separate encounter.

## 2022-01-14 ENCOUNTER — Other Ambulatory Visit: Payer: Self-pay

## 2022-01-16 ENCOUNTER — Ambulatory Visit: Payer: Medicaid Other

## 2022-01-18 NOTE — Progress Notes (Incomplete)
[facility] Follow-up Note     Reason For Visit:   No chief complaint on file.   ***    HPI:      Cindy Ochoa is 36 y.o. female with PMHx of quadriparesis at c6 neurogenic bladder s/p urostomy, neurogenic bowel, autonomic dysfunction osteoporosis, diabetes, depression, constipation,    DM: follows with endocrinology; glargine 30 units nightly, lispro sliding scale, metformin 500mg  BID (not taking?) and rybelsus (not listed on med list)    ENT for trach repair    Plastics for cyst on neck      Medications/Allergies/Immunizations:     Medication list reviewed and reconciled this visit in the EMR. Allergy list confirmed in the EMR.    Current Outpatient Medications   Medication Sig   . blood glucose (FREESTYLE LITE) test strip Use 4 test strips per day.   . cephalexin (KEFLEX) 500 mg capsule Take 1 capsule (500 mg total) by mouth every 8 hours  for Simple Infection of the Urinary Tract   . insulin glargine-yfgn (SEMGLEE) 100 UNIT/ML injection pen Inject 30 units into the skin nightly  for Type 2 Diabetes (Patient not taking: Reported on 12/30/2021)   . bisacodyl (DULCOLAX) 10 mg suppository Unwrap and insert 1 suppository (10 mg total) rectally daily as needed   . senna (SENOKOT) 8.6 mg tablet Take 2 tablets by mouth 2 times daily   . mupirocin (BACTROBAN) 2 % ointment Apply topically 2 times daily  for Hair Follicle Inflammation to the following areas: cyst neck   . FreeStyle Lancets (FREESTYLE) Use 4 lancets per day.   . metFORMIN (GLUCOPHAGE) 500 mg tablet Take 1 tablet (500 mg total) by mouth 2 times daily (with meals) (Patient not taking: Reported on 12/30/2021)   . dextrose (GLUTOSE) 40 % oral gel Take 1 tube (15 g total) by mouth once as needed for Low blood sugar for up to 1 dose   . insulin lispro (ADMELOG SOLOSTAR) 100 UNIT/ML injection pen Inject 2-10 units into the skin daily before dinner. Maximum: 17 Units/day. Discard pen 28 days after first use.   LISS 1:40 >140, nb 10 (only with  dinner) (Patient not taking: Reported on 12/30/2021)   . Alcohol Swabs (ALCOHOL PREP) 70 % PADS Use 4 times a day as directed   . glucagon (BAQSIMI) 3 MG/DOSE nasal powder Inhale one dose (3 mg) into one nostril once as needed for low blood sugar. If no response in 15 min, inhale second dose from new device   . insulin pen needle (BD PEN NEEDLE NANO U/F) 32G X 4 MM Use three times a day as instructed.   . insulin pen needle (BD ULTRA-FINE PEN NEEDLE NANO) 32G X 4 MM Use three times a day as instructed.   Marland Kitchen acetaminophen (MAPAP) 500 mg tablet Take 2 tablets (1,000 mg total) by mouth every 8 hours as needed for Pain or Fever  for pain   . melatonin 3 mg Take 1 tablet (3 mg total) by mouth nightly as needed for Sleep   . Blood Glucose Monitoring Suppl (FREESTYLE LITE) w/Device KIT Use to test blood sugar as directed   . generic DME Dispense: chux pads   ICD-10: N39.41  Duration: Lifetime  Ht Readings from Last 1 Encounters:  04/16/21 : 1.626 m (5\' 4" )   Wt Readings from Last 1 Encounters:  04/16/21 : 61.2 kg (135 lb)   . generic DME Dispense: briefs   ICD-10: N39.41  Duration: Lifetime  Ht  Readings from Last 1 Encounters:  04/16/21 : 1.626 m (5\' 4" )   Wt Readings from Last 1 Encounters:  04/16/21 : 61.2 kg (135 lb)   . Non-System Medication Chux pads  Ht: 5\' 5"  Wt: 143lbs  ICD code: N39.41   . Non-System Medication Briefs/pullups, large  Ht: 5\' 5"  Wt: 143lbs  ICD code: N39.41   . Non-System Medication Motorized wheelchair-   Lifetime 99  Quad c-6   . Non-System Medication Nebulizer with tubing and mask  RO 689  Respiratory insufficiency   . Non-System Medication Portable suction  respiratory insufficiency  Tracheotomy   Quadriparesis   . Gauze Pads & Dressings (POLYMEM TRACH DRESSING) 3-1/2"X3-1/2" PADS Use as needed   . generic DME Suction machine with all related supplies.   . Non-System Medication Large underpads  Ht: 5\' 5"  Wt: 143lbs  ICD code: N39.41   . generic DME 1/2 inch plain packing gauze   . generic DME  Dispense: ROHO  Indication: Quadriplegia   ICD-10: R53.2  Duration: Lifetime  Ht Readings from Last 1 Encounters:  03/19/20 : 1.626 m (5\' 4" )   Wt Readings from Last 1 Encounters:  03/19/20 : 63.5 kg (140 lb)   . hydrocortisone (ANUSOL-HC) 25 MG suppository Place 1 suppository rectally  every other night   . Non-System Medication Gloves  Ht: 5\' 5"  Wt: 143lbs  ICD code: N39.41   . disposable underpads 30"x36" (CHUX) Use 6 times daily and PRN. Dx N39.42  Incontinence without sensory awareness   . incontinence supply disposable Large pull ups - use up to 5 x daily  Dx N39.46   . disposable gloves 1 box Dynarex PF Vinyl Gloves   . patient lift Use as directed for patient lifting. For lifetime use; ICD 10: G82.54 Ht: 1.37m Wt: 76.2 kg   . adjustable bath/shower seat with back For lifetime use; ICD 10: G82.54 Ht: 1.3m Wt: 76.2 kg   . Non-System Medication Urostomy drainage bags 2000 cc change as needed. Dx N39.46 and  G82.54   . generic DME Wafers  Dx code: N31.9  Qty: 10  Refills: 6   . generic DME Urostomy Pouches  Dx Codes(s): N31.9  Qty: 20  Refills: 6   . generic DME Leg bag  Dx Code: N31.9  Qty: 4  Refills: 6   . generic DME Night drainage bag  Dx Code: N31.9  Qty: 4  Refills: 6   . generic DME Barrier rings  Dx Code: N31.9  Qty: 15  Refills: 6   . Ostomy Supplies [employer] Urostomy two piece bag and wafer 1 1/4 "H  7/8 "V N31.9 Neuromuscular dysfunction of the bladder   . Ostomy Supplies [facility] Coloplast Urostomy two piece bag and wafer 1 1/4 "H  7/8 "V N31.9 Neuromuscular dysfunction of the bladder. Use as directed.   Sherren Mocha Supplies Pouch [employer] Coloplast Urostomy two piece bag and wafer 1 1/4 "H  7/8 "V N31.9 Neuromuscular dysfunction of the bladder Use as directed   . Ostomy Supplies MISC Barrier ring. Use as directed.   Marland Kitchen generic DME Repairs to hospital bed/hoyer lift  ICD 10: G82.54   Ht: 1.28m Wt: 76.2 kg   . disposable gloves 2 boxes Disposable Medium size gloves N39.46   . generic DME Urostomy  drainage bags 2000 cc change as needed. Dx N39.46 and  G82.54   . Gauze Pads & Dressings (ABDOMINAL PAD) 8"X10" PADS By 1 each no specified route daily   Cover buttock wounds  2x daily   . Non-System Medication Gel overlay mattress for hospital bed - diagnosis G82.54, L89.93, L89.159   . Non-System Medication Easy Tip Leg Bags 1000mg  - diagnosis G82.53 N39.46   . etonogestrel (IMPLANON, NEXPLANON) 68 MG IMPL Inject 68 mg into the skin once   Placed 10/20/16           Past Medical History/Family History/Social History:     Past Medical History, Family History, and Social History reviewed, confirmed, and updated as appropriate this visit in the EMR.       Physical Exam:     There were no vitals filed for this visit.  There were no vitals filed for this visit.  BP Readings from Last 3 Encounters:   11/10/21 118/64   11/01/21 108/70   10/13/21 117/68     Wt Readings from Last 3 Encounters:   12/30/21 65.8 kg (145 lb)   09/19/21 63.5 kg (140 lb)   08/20/21 63.5 kg (140 lb)     NGE:XBMWU is no height or weight on file to calculate BMI.    ***  General: Pleasant, conversant. No distress.  HEENT: Moist mucus membranes.  Pulmonary: Breathing comfortably. Lungs CTAB.   Cardiovascular: RRR. No murmurs, rubs, or gallops.  Abdominal: Soft. Non-distended. Non-tender.  Extremities: Warm, dry. No peripheral edema.      Labs and Imaging:     Laboratory and imaging results reviewed.      Assessment and Plan:     Cindy Ochoa is a 36 y.o. female with ***  There are no diagnoses linked to this encounter.      Orders Placed This Encounter   No orders placed during this encounter.         Follow-up:     RTC:  ***      Author:     Earle Gell, MD  Internal Medicine PGY-1   [facility] [phone]

## 2022-01-19 ENCOUNTER — Other Ambulatory Visit: Payer: Self-pay

## 2022-01-20 ENCOUNTER — Ambulatory Visit: Payer: Medicaid Other

## 2022-01-21 ENCOUNTER — Other Ambulatory Visit: Payer: Self-pay

## 2022-01-21 ENCOUNTER — Encounter: Payer: Self-pay | Admitting: Physical Medicine and Rehabilitation

## 2022-01-21 ENCOUNTER — Ambulatory Visit
Payer: Medicaid Other | Attending: Physical Medicine and Rehabilitation | Admitting: Physical Medicine and Rehabilitation

## 2022-01-21 VITALS — BP 100/80 | HR 100 | Temp 96.8°F | Wt 145.0 lb

## 2022-01-21 DIAGNOSIS — M62838 Other muscle spasm: Secondary | ICD-10-CM | POA: Insufficient documentation

## 2022-01-21 MED ORDER — ONABOTULINUMTOXINA 100 UNIT IJ SOLR *I*
100.0000 [IU] | Freq: Once | INTRAMUSCULAR | Status: AC | PRN
Start: 2022-01-21 — End: 2022-01-21
  Administered 2022-01-21: 100 [IU] via INTRAMUSCULAR

## 2022-01-21 NOTE — Procedures (Addendum)
PMR Botox Injections  Performed by: Alena Bills, MD  Authorized by: Alena Bills, MD     Date/time: 01/21/2022  1:30 PM EST  Injections:  57897 - Chemodenervation 1 ext 1-4 muscles and 64643 - Chemodenervation 1 ext 1-4 muscles, 1st addtl  Medication:  100 units botulinum toxin type A 100 units; 100 units botulinum toxin type A 100 units; 100 units botulinum toxin type A 100 units; 100 units botulinum toxin type A 100 units; 100 units botulinum toxin type A 100 units; 100 units botulinum toxin type A 100 units   See below      Procedure Details   The risks, benefits, indications, potential complications, and alternatives were explained to the patient and/or guardian who verbalized understanding and informed consent obtained.     The area for injection was identified and a time out called to re-identify.      The correct patient was identified with 2 identifiers The correct procedure, location site(s) and laterality were identified with the patient and/or guardian and correspond to the consent form  The appropriate site was marked and verified.  The patient was placed in the correct position.    After prepping the skin with alcohol overlying the following muscles, botlinum toxin was injected intramuscularly as follows.     Right and left quadriceps (vastus lateralis 75, vastus medialis 75, vastus intermedius 75, rectus femoris 75) 300 + 300 units      Botulinum toxin Lot #: [id number]  Botulinum toxin expiration date: 02/2024  Total botox units injected: 600 units   Total botox units wasted: 0 units     The patient tolerated the procedure without complications.    Patient will follow up in 12 weeks for repeat botox injection.    Patient had questions about her skin biopsy, results of diagnosis were given to the patient, discussed that bilateral patellar skin breakdown was unrelated to botox injection.       Alena Bills, MD

## 2022-01-21 NOTE — Patient Instructions (Addendum)
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

## 2022-01-27 ENCOUNTER — Other Ambulatory Visit: Payer: Self-pay

## 2022-02-03 ENCOUNTER — Other Ambulatory Visit: Payer: Self-pay

## 2022-02-03 ENCOUNTER — Ambulatory Visit: Payer: Medicaid Other | Admitting: Otolaryngology

## 2022-02-09 ENCOUNTER — Other Ambulatory Visit: Payer: Medicaid Other

## 2022-02-13 ENCOUNTER — Other Ambulatory Visit: Payer: Self-pay

## 2022-02-13 ENCOUNTER — Encounter: Payer: Self-pay | Admitting: Gastroenterology

## 2022-02-13 NOTE — Progress Notes (Signed)
CM reached out to patient in order to review upcoming appointments. CM and patient reviewed her upcoming appointments. Patient has radiology and [facility] appointment on 3/10.   Patient notified CM that she would like a sooner appointment at [facility] to discuss some personal concerns she is having. CM then reached out to team PSS who scheduled patient for 2/20. CM then called patient and left her voice message. CM also sent patient a text with new appointment details. CM will continue to update patient.

## 2022-02-16 ENCOUNTER — Ambulatory Visit: Payer: Medicaid Other | Admitting: Internal Medicine

## 2022-02-16 ENCOUNTER — Other Ambulatory Visit: Payer: Self-pay

## 2022-02-16 ENCOUNTER — Encounter: Payer: Self-pay | Admitting: Internal Medicine

## 2022-02-16 VITALS — BP 134/76 | HR 86 | Ht 65.0 in

## 2022-02-16 DIAGNOSIS — F32A Depression, unspecified: Secondary | ICD-10-CM

## 2022-02-16 MED ORDER — POLYETHYLENE GLYCOL 3350 PO POWD *I*
17.0000 g | Freq: Every day | ORAL | 3 refills | Status: DC
Start: 2022-02-16 — End: 2022-04-11

## 2022-02-16 MED ORDER — SERTRALINE HCL 50 MG PO TABS *I*
50.0000 mg | ORAL_TABLET | Freq: Every day | ORAL | 3 refills | Status: DC
Start: 2022-02-16 — End: 2022-04-30
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
## 2022-02-26 ENCOUNTER — Telehealth: Payer: Self-pay

## 2022-02-26 NOTE — Telephone Encounter (Signed)
[facility] sent to patient informing patient that annual appointment for 05/04/2022 has been canceled patient had a completed annual exam on 05/29/2021 and would not be due for another annual exam until 05/29/2022 or after. If patient calls please assist with scheduling any other concerns she would need to be seen for.

## 2022-03-05 ENCOUNTER — Encounter: Payer: Self-pay | Admitting: Gastroenterology

## 2022-03-05 ENCOUNTER — Other Ambulatory Visit: Payer: Self-pay

## 2022-03-05 NOTE — Progress Notes (Signed)
[facility] Follow-up Note     Reason For Visit: Follow-up    HPI:     Cindy Ochoa is a 35 y.o. female with PMHx significant for quadriparesis at c6 neurogenic bladder s/p urostomy, neurogenic bowel, autonomic dysfunction osteoporosis, diabetes, depression, constipation,    DM: last saw Endocrinology in September 2022, last A1c 9.9 on 09/25/21; glargine 20 units nightly, lispro sliding scale NB10 with 1:40 correction scale with BG>140, metformin 516m BID and rybelsus - stopped taking medications in October because she felt fine. Measures her BGs 2-3 times per week, never over 150. Usually measures it in the morning, lowest BG was 105, highest in the AM was 150.    Was last seen in clinic on 02/16/22 during which zoloft was prescribed for depression. Has been feeling well this dose, feels "calmer."    For muscle spasms, sees PM&R for botox injections, due for one next month.    Constipation improved since starting miralax. Taking miralax BID, Taking one pill senna once a day. Having a BM every other day.     Assessment and Plan:     RMaddalynnwas seen today for follow-up.    Diagnoses and all orders for this visit:    Type 2 diabetes mellitus without complication, with long-term current use of insulin  - POC A1c in clinic 6.3 today  - will hold off on restarting medications  - will repeat A1c with other blood work  -     POCT Hemoglobin A1C  -     POCT HEMOGLOBIN A1C  -     Microalbumin, Urine, Random; Future  -     Hemoglobin A1c; Future  -     CBC and differential; Future  -     COMPREHENSIVE METABOLIC PANEL; Future    Constipation, chronic  - increase senna to 2 tablets daily  - continue miralax BID and suppository PRN    Tech in clinic had a needstick exposure while collecting POC A1c, patient was asked to be tested for HIV, Hepatitis B and Hepatitis C. Patient did not want to get bloodwork today and left clinic prior to completing visit. Sent patient a MyChart message that blood work was  ordered.    Orders Placed This Encounter    Microalbumin, Urine, Random    Hemoglobin A1c    CBC and differential    COMPREHENSIVE METABOLIC PANEL    HIV 1&2 antigen/antibody    Hepatitis B & C profile    POCT Hemoglobin A1C    POCT HEMOGLOBIN A1C       Medications/Allergies:     Medication list reviewed and reconciled this visit in the EMR. Allergy list confirmed in the EMR.    Current Outpatient Medications   Medication Sig    polyethylene glycol (GLYCOLAX) powder Take 17 g by mouth daily  Mix in 8 oz water or juice and drink.    sertraline (ZOLOFT) 50 mg tablet Take 1 tablet (50 mg total) by mouth daily    bisacodyl (DULCOLAX) 10 mg suppository Unwrap and insert 1 suppository (10 mg total) rectally daily as needed    senna (SENOKOT) 8.6 mg tablet Take 2 tablets by mouth 2 times daily    mupirocin (BACTROBAN) 2 % ointment Apply topically 2 times daily  for Hair Follicle Inflammation to the following areas: cyst neck    acetaminophen (MAPAP) 500 mg tablet Take 2 tablets (1,000 mg total) by mouth every 8 hours as needed for Pain or Fever  for pain    melatonin 3 mg Take 1 tablet (3 mg total) by mouth nightly as needed for Sleep    hydrocortisone (ANUSOL-HC) 25 MG suppository Place 1 suppository rectally  every other night    etonogestrel (IMPLANON, NEXPLANON) 68 MG IMPL Inject 68 mg into the skin once  Placed 10/20/16    blood glucose (FREESTYLE LITE) test strip Use 4 test strips per day.    insulin glargine-yfgn (SEMGLEE) 100 UNIT/ML injection pen Inject 30 units into the skin nightly  for Type 2 Diabetes    FreeStyle Lancets (FREESTYLE) Use 4 lancets per day.    metFORMIN (GLUCOPHAGE) 500 mg tablet Take 1 tablet (500 mg total) by mouth 2 times daily (with meals)    insulin lispro (ADMELOG SOLOSTAR) 100 UNIT/ML injection pen Inject 2-10 units into the skin daily before dinner. Maximum: 17 Units/day. Discard pen 28 days after first use.   LISS 1:40 >140, nb 10 (only with dinner)    Alcohol  Swabs (ALCOHOL PREP) 70 % PADS Use 4 times a day as directed    glucagon (BAQSIMI) 3 MG/DOSE nasal powder Inhale one dose (3 mg) into one nostril once as needed for low blood sugar. If no response in 15 min, inhale second dose from new device    insulin pen needle (BD PEN NEEDLE NANO U/F) 32G X 4 MM Use three times a day as instructed.    insulin pen needle (BD ULTRA-FINE PEN NEEDLE NANO) 32G X 4 MM Use three times a day as instructed.    Blood Glucose Monitoring Suppl (FREESTYLE LITE) w/Device KIT Use to test blood sugar as directed    generic DME Dispense: chux pads   ICD-10: N39.41  Duration: Lifetime  Ht Readings from Last 1 Encounters:  04/16/21 : 1.626 m (5' 4" )   Wt Readings from Last 1 Encounters:  04/16/21 : 61.2 kg (135 lb)    generic DME Dispense: briefs   ICD-10: N39.41  Duration: Lifetime  Ht Readings from Last 1 Encounters:  04/16/21 : 1.626 m (5' 4" )   Wt Readings from Last 1 Encounters:  04/16/21 : 61.2 kg (135 lb)    Non-System Medication Chux pads  Ht: 5' 5"  Wt: 143lbs  ICD code: N39.41    Non-System Medication Briefs/pullups, large  Ht: 5' 5"  Wt: 143lbs  ICD code: N39.41    Non-System Medication Motorized wheelchair-   Lifetime 99  Quad c-6    Non-System Medication Nebulizer with tubing and mask  RO 689  Respiratory insufficiency    Non-System Medication Portable suction  respiratory insufficiency  Tracheotomy   Quadriparesis    Gauze Pads & Dressings (POLYMEM TRACH DRESSING) 3-1/2"X3-1/2" PADS Use as needed    generic DME Suction machine with all related supplies.    Non-System Medication Large underpads  Ht: 5' 5"  Wt: 143lbs  ICD code: N39.41    generic DME 1/2 inch plain packing gauze    generic DME Dispense: ROHO  Indication: Quadriplegia   ICD-10: R53.2  Duration: Lifetime  Ht Readings from Last 1 Encounters:  03/19/20 : 1.626 m (5' 4" )   Wt Readings from Last 1 Encounters:  03/19/20 : 63.5 kg (140 lb)    Non-System Medication Gloves  Ht: 5' 5"  Wt: 143lbs  ICD code: N39.41     disposable underpads 30"x36" (CHUX) Use 6 times daily and PRN. Dx N39.42  Incontinence without sensory awareness    incontinence supply disposable Large pull ups - use up to 5 x daily  Dx N39.46    disposable gloves 1 box Dynarex PF Vinyl Gloves    patient lift Use as directed for patient lifting. For lifetime use; ICD 10: G82.54 Ht: 1.55mWt: 76.2 kg    adjustable bath/shower seat with back For lifetime use; ICD 10: G82.54 Ht: 1.621mt: 76.2 kg    Non-System Medication Urostomy drainage bags 2000 cc change as needed. Dx N39.46 and  G82.54    generic DME Wafers  Dx code: N31.9  Qty: 10  Refills: 6    generic DME Urostomy Pouches  Dx Codes(s): N31.9  Qty: 20  Refills: 6    generic DME Leg bag  Dx Code: N31.9  Qty: 4  Refills: 6    generic DME Night drainage bag  Dx Code: N31.9  Qty: 4  Refills: 6    generic DME Barrier rings  Dx Code: N31.9  Qty: 15  Refills: 6    Ostomy Supplies [employer] Pontrostomy two piece bag and wafer 1 1/4 "H  7/8 "V N31.9 Neuromuscular dysfunction of the bladder    Ostomy Supplies [facility] Urostomy two piece bag and wafer 1 1/4 "H  7/8 "V N31.9 Neuromuscular dysfunction of the bladder. Use as directed.    Ostomy Supplies Pouch MIE. I. du Pontrostomy two piece bag and wafer 1 1/4 "H  7/8 "V N31.9 Neuromuscular dysfunction of the bladder Use as directed    Ostomy Supplies MISC Barrier ring. Use as directed.    generic DME Repairs to hospital bed/hoyer lift  ICD 10: G82.54   Ht: 1.6534m: 76.2 kg    disposable gloves 2 boxes Disposable Medium size gloves N39.46    generic DME Urostomy drainage bags 2000 cc change as needed. Dx N39.46 and  G82.54    Gauze Pads & Dressings (ABDOMINAL PAD) 8"X10" PADS By 1 each no specified route daily   Cover buttock wounds 2x daily    Non-System Medication Gel overlay mattress for hospital bed - diagnosis G82.54, L89.93, L89.159    Non-System Medication Easy Tip Leg Bags 1000m23mdiagnosis G82.53 N39.46           Physical  Exam:     BP 122/87    Pulse 107    Temp 36.1 C (97 F)    SpO2 97%     BP Readings from Last 3 Encounters:   03/06/22 122/87   02/16/22 134/76   01/21/22 100/80     Wt Readings from Last 3 Encounters:   01/21/22 65.8 kg (145 lb)   12/30/21 65.8 kg (145 lb)   09/19/21 63.5 kg (140 lb)     General: Pleasant  female, anxious, sitting in motorized wheelchair.  HEENT: Oropharynx without erythema or exudate.   Cardiovascular: Regular rate and rhythm. No murmurs, rubs, or gallops.  Resp: Clear to ausculation bilaterally.   Abdominal: Soft, non-tender, non-distended. Ostomy and urostomy present.  Extremity: No edema. Lower extremities appear symmetric. Bilateral lower extremity spasticity. No sensation in lower extremities bilaterally (baseline).    Labs and Imaging:     [facility]     RTC: Will call clinic to schedule follow up with DeniLavone OrnSigned by:   NeesGwenith Spitzternal Medicine, PGY-1

## 2022-03-05 NOTE — Progress Notes (Signed)
Patient reached out to [facility] requesting assistance with setting up medical transportation for her upcoming appointments. CM then reached out to team PSS who completed a 2015 form for patients wheelchair transportation. CM then set up transportation for patients [facility] and radiology appointments on 3/10. CM then updated patient.     Cindy Ochoa is scheduled for a Primary Care Office appointment on 03/06/2022 at 1:00pm.  At 12:00pm [employer] will pick up [employer] at [place] and take them to [place], [place] [employer] will pick up [employer] at [employer], [place] and take them to [place] [employer] will pick up [employer] at [place] and take them to [place], [place] may contact [employer] at [phone] for changes to pick-up and return times prior to this trip.    To ensure seamless access to your healthcare:  If the assigned Transportation Provider cannot accommodate your trip, MAS will reassign it to the next available provider  On the day of the trip, you may confirm your provider - call the automated MAS System & select option 'Confirm My Trip'      [place]  Date: 03/06/2022 Time: 1:00pm Inv# [phone]

## 2022-03-06 ENCOUNTER — Ambulatory Visit
Admission: RE | Admit: 2022-03-06 | Discharge: 2022-03-06 | Disposition: A | Discharge status: Home / Self Care | Payer: Medicaid Other | Source: Ambulatory Visit

## 2022-03-06 ENCOUNTER — Ambulatory Visit: Payer: Medicaid Other | Attending: Urology

## 2022-03-06 ENCOUNTER — Other Ambulatory Visit: Payer: Self-pay

## 2022-03-06 VITALS — BP 122/87 | HR 107 | Temp 97.0°F

## 2022-03-06 DIAGNOSIS — E119 Type 2 diabetes mellitus without complications: Secondary | ICD-10-CM | POA: Insufficient documentation

## 2022-03-06 DIAGNOSIS — N390 Urinary tract infection, site not specified: Secondary | ICD-10-CM | POA: Insufficient documentation

## 2022-03-06 DIAGNOSIS — N319 Neuromuscular dysfunction of bladder, unspecified: Secondary | ICD-10-CM | POA: Insufficient documentation

## 2022-03-06 DIAGNOSIS — K5909 Other constipation: Secondary | ICD-10-CM | POA: Insufficient documentation

## 2022-03-06 DIAGNOSIS — Z0000 Encounter for general adult medical examination without abnormal findings: Secondary | ICD-10-CM | POA: Insufficient documentation

## 2022-03-06 DIAGNOSIS — Z794 Long term (current) use of insulin: Secondary | ICD-10-CM | POA: Insufficient documentation

## 2022-03-06 LAB — POCT HEMOGLOBIN A1C: Hemoglobin A1C,POC: 6.3 % — ABNORMAL HIGH

## 2022-03-13 ENCOUNTER — Other Ambulatory Visit: Payer: Self-pay | Admitting: Gastroenterology

## 2022-03-13 ENCOUNTER — Other Ambulatory Visit
Admission: RE | Admit: 2022-03-13 | Discharge: 2022-03-13 | Disposition: A | Discharge status: Home / Self Care | Payer: Medicaid Other | Source: Ambulatory Visit | Attending: Internal Medicine | Admitting: Internal Medicine

## 2022-03-13 DIAGNOSIS — Z794 Long term (current) use of insulin: Secondary | ICD-10-CM | POA: Insufficient documentation

## 2022-03-13 DIAGNOSIS — E119 Type 2 diabetes mellitus without complications: Secondary | ICD-10-CM | POA: Insufficient documentation

## 2022-03-13 DIAGNOSIS — Z0000 Encounter for general adult medical examination without abnormal findings: Secondary | ICD-10-CM | POA: Insufficient documentation

## 2022-03-13 LAB — COMPREHENSIVE METABOLIC PANEL
ALT: 14 U/L (ref 0–35)
AST: 14 U/L (ref 0–35)
Albumin: 4.3 g/dL (ref 3.5–5.2)
Alk Phos: 72 U/L (ref 35–105)
Anion Gap: 16 (ref 7–16)
Bilirubin,Total: 0.3 mg/dL (ref 0.0–1.2)
CO2: 21 mmol/L (ref 20–28)
Calcium: 9.8 mg/dL (ref 8.8–10.2)
Chloride: 105 mmol/L (ref 96–108)
Creatinine: 0.41 mg/dL — ABNORMAL LOW (ref 0.51–0.95)
Glucose: 103 mg/dL — ABNORMAL HIGH (ref 60–99)
Lab: 10 mg/dL (ref 6–20)
Potassium: 4.2 mmol/L (ref 3.3–5.1)
Sodium: 142 mmol/L (ref 133–145)
Total Protein: 7.4 g/dL (ref 6.3–7.7)
eGFR BY CREAT: 130 *

## 2022-03-13 LAB — CBC AND DIFFERENTIAL
Baso # K/uL: 0 10*3/uL (ref 0.0–0.1)
Basophil %: 0.3 %
Eos # K/uL: 0 10*3/uL (ref 0.0–0.4)
Eosinophil %: 0.3 %
Hematocrit: 46 % — ABNORMAL HIGH (ref 34–45)
Hemoglobin: 14.4 g/dL (ref 11.2–15.7)
IMM Granulocytes #: 0 10*3/uL (ref 0.0–0.0)
IMM Granulocytes: 0.1 %
Lymph # K/uL: 3.6 10*3/uL (ref 1.2–3.7)
Lymphocyte %: 46.1 %
MCH: 30 pg (ref 26–32)
MCHC: 31 g/dL — ABNORMAL LOW (ref 32–36)
MCV: 96 fL — ABNORMAL HIGH (ref 79–95)
Mono # K/uL: 0.5 10*3/uL (ref 0.2–0.9)
Monocyte %: 6.1 %
Neut # K/uL: 3.6 10*3/uL (ref 1.6–6.1)
Nucl RBC # K/uL: 0 10*3/uL (ref 0.0–0.0)
Nucl RBC %: 0 /100 WBC (ref 0.0–0.2)
RBC: 4.8 MIL/uL (ref 3.9–5.2)
RDW: 15.1 % — ABNORMAL HIGH (ref 11.7–14.4)
Seg Neut %: 47.1 %
WBC: 7.7 10*3/uL (ref 4.0–10.0)

## 2022-03-13 LAB — MICROALBUMIN, URINE, RANDOM
Creatinine,UR: 78 mg/dL (ref 20–300)
Microalb/Creat Ratio: 200.3 mg MA/g CR — ABNORMAL HIGH (ref 0.0–29.9)
Microalbumin,UR: 15.62 mg/dL

## 2022-03-15 LAB — HEMOGLOBIN A1C: Hemoglobin A1C: 6.4 % — ABNORMAL HIGH

## 2022-03-18 ENCOUNTER — Encounter: Payer: Self-pay | Admitting: Gastroenterology

## 2022-03-19 ENCOUNTER — Encounter: Payer: Self-pay | Admitting: Urology

## 2022-03-24 ENCOUNTER — Other Ambulatory Visit: Payer: Self-pay

## 2022-03-27 ENCOUNTER — Ambulatory Visit: Payer: Medicaid Other | Admitting: Psychiatry

## 2022-04-06 ENCOUNTER — Emergency Department: Payer: Medicaid Other

## 2022-04-06 ENCOUNTER — Inpatient Hospital Stay
Admission: EM | Admit: 2022-04-06 | Discharge: 2022-04-11 | DRG: 720 | Disposition: A | Discharge status: Home / Self Care | Payer: Medicaid Other | Source: Ambulatory Visit | Attending: Internal Medicine | Admitting: Internal Medicine

## 2022-04-06 ENCOUNTER — Encounter: Payer: Self-pay | Admitting: Student in an Organized Health Care Education/Training Program

## 2022-04-06 ENCOUNTER — Other Ambulatory Visit: Payer: Self-pay

## 2022-04-06 ENCOUNTER — Encounter: Payer: Self-pay | Admitting: Gastroenterology

## 2022-04-06 DIAGNOSIS — B961 Klebsiella pneumoniae [K. pneumoniae] as the cause of diseases classified elsewhere: Secondary | ICD-10-CM | POA: Diagnosis present

## 2022-04-06 DIAGNOSIS — R000 Tachycardia, unspecified: Secondary | ICD-10-CM

## 2022-04-06 DIAGNOSIS — Z936 Other artificial openings of urinary tract status: Secondary | ICD-10-CM

## 2022-04-06 DIAGNOSIS — D696 Thrombocytopenia, unspecified: Secondary | ICD-10-CM | POA: Diagnosis present

## 2022-04-06 DIAGNOSIS — Z20822 Contact with and (suspected) exposure to covid-19: Secondary | ICD-10-CM | POA: Diagnosis present

## 2022-04-06 DIAGNOSIS — F32A Depression, unspecified: Secondary | ICD-10-CM | POA: Diagnosis present

## 2022-04-06 DIAGNOSIS — G909 Disorder of the autonomic nervous system, unspecified: Secondary | ICD-10-CM | POA: Diagnosis present

## 2022-04-06 DIAGNOSIS — A419 Sepsis, unspecified organism: Principal | ICD-10-CM | POA: Diagnosis present

## 2022-04-06 DIAGNOSIS — G904 Autonomic dysreflexia: Secondary | ICD-10-CM | POA: Diagnosis present

## 2022-04-06 DIAGNOSIS — K5909 Other constipation: Secondary | ICD-10-CM | POA: Diagnosis present

## 2022-04-06 DIAGNOSIS — E119 Type 2 diabetes mellitus without complications: Secondary | ICD-10-CM | POA: Diagnosis present

## 2022-04-06 DIAGNOSIS — N319 Neuromuscular dysfunction of bladder, unspecified: Secondary | ICD-10-CM | POA: Diagnosis present

## 2022-04-06 DIAGNOSIS — K5900 Constipation, unspecified: Secondary | ICD-10-CM | POA: Diagnosis present

## 2022-04-06 DIAGNOSIS — R911 Solitary pulmonary nodule: Secondary | ICD-10-CM

## 2022-04-06 DIAGNOSIS — G8250 Quadriplegia, unspecified: Secondary | ICD-10-CM | POA: Diagnosis present

## 2022-04-06 DIAGNOSIS — N390 Urinary tract infection, site not specified: Secondary | ICD-10-CM | POA: Diagnosis present

## 2022-04-06 DIAGNOSIS — R112 Nausea with vomiting, unspecified: Secondary | ICD-10-CM

## 2022-04-06 DIAGNOSIS — J069 Acute upper respiratory infection, unspecified: Secondary | ICD-10-CM

## 2022-04-06 DIAGNOSIS — L89323 Pressure ulcer of left buttock, stage 3: Secondary | ICD-10-CM | POA: Diagnosis present

## 2022-04-06 MED ORDER — SODIUM CHLORIDE 0.9 % FLUSH FOR PUMPS *I*
0.0000 mL/h | INTRAVENOUS | Status: DC | PRN
Start: 2022-04-06 — End: 2022-04-11
  Administered 2022-04-10: 500 mL/h via INTRAVENOUS

## 2022-04-06 MED ORDER — DEXTROSE 5 % FLUSH FOR PUMPS *I*
0.0000 mL/h | INTRAVENOUS | Status: DC | PRN
Start: 2022-04-06 — End: 2022-04-11
  Administered 2022-04-10: 30 mL/h via INTRAVENOUS

## 2022-04-06 MED ORDER — ACETAMINOPHEN 500 MG PO TABS *I*
1000.0000 mg | ORAL_TABLET | Freq: Once | ORAL | Status: AC
Start: 2022-04-06 — End: 2022-04-06
  Administered 2022-04-06: 1000 mg via ORAL
  Filled 2022-04-06: qty 2

## 2022-04-06 MED ORDER — SODIUM CHLORIDE 0.9 % IV BOLUS *I*
1000.0000 mL | Freq: Once | Status: AC
Start: 2022-04-06 — End: 2022-04-07
  Administered 2022-04-06: 1000 mL via INTRAVENOUS

## 2022-04-06 NOTE — ED Triage Notes (Signed)
Pt via EMS w/ c/o chills, fatigue & poor PO intake over past few days. Concerned urine has foul order from foley. Concerned for UTI. Hx paraplegia, ulceration, DM, Autonomic dysfunction.      Prehospital medications given: No
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
## 2022-04-07 ENCOUNTER — Encounter: Payer: Self-pay | Admitting: Student in an Organized Health Care Education/Training Program

## 2022-04-07 DIAGNOSIS — D75829 Heparin-induced thrombocytopenia, unspecified: Secondary | ICD-10-CM | POA: Insufficient documentation

## 2022-04-07 DIAGNOSIS — G8250 Quadriplegia, unspecified: Secondary | ICD-10-CM | POA: Diagnosis present

## 2022-04-07 DIAGNOSIS — A419 Sepsis, unspecified organism: Principal | ICD-10-CM

## 2022-04-07 DIAGNOSIS — N319 Neuromuscular dysfunction of bladder, unspecified: Secondary | ICD-10-CM | POA: Diagnosis present

## 2022-04-07 LAB — URINALYSIS WITH MICROSCOPIC
Glucose,UA: NEGATIVE
Ketones, UA: NEGATIVE
Nitrite,UA: NEGATIVE
Specific Gravity,UA: 1.015 (ref 1.002–1.030)
Squam Epithel,UA: NONE SEEN /lpf (ref 0–?)
WBC,UA: >50 /hpf — AB (ref 0–5)
pH,UA: 6.5 (ref 5.0–8.0)

## 2022-04-07 LAB — BASIC METABOLIC PANEL
Anion Gap: 13 (ref 7–16)
Anion Gap: 14 (ref 7–16)
Anion Gap: 14 (ref 7–16)
CO2: 21 mmol/L (ref 20–28)
CO2: 23 mmol/L (ref 20–28)
CO2: 24 mmol/L (ref 20–28)
Calcium: 8.5 mg/dL — ABNORMAL LOW (ref 8.8–10.2)
Calcium: 8.7 mg/dL — ABNORMAL LOW (ref 8.8–10.2)
Calcium: 9.5 mg/dL (ref 8.8–10.2)
Chloride: 101 mmol/L (ref 96–108)
Chloride: 103 mmol/L (ref 96–108)
Chloride: 106 mmol/L (ref 96–108)
Creatinine: 0.39 mg/dL — ABNORMAL LOW (ref 0.51–0.95)
Creatinine: 0.48 mg/dL — ABNORMAL LOW (ref 0.51–0.95)
Creatinine: 0.68 mg/dL (ref 0.51–0.95)
Glucose: 121 mg/dL — ABNORMAL HIGH (ref 60–99)
Glucose: 142 mg/dL — ABNORMAL HIGH (ref 60–99)
Glucose: 146 mg/dL — ABNORMAL HIGH (ref 60–99)
Lab: 10 mg/dL (ref 6–20)
Lab: 12 mg/dL (ref 6–20)
Lab: 9 mg/dL (ref 6–20)
Potassium: 3.1 mmol/L — ABNORMAL LOW (ref 3.3–5.1)
Potassium: 3.8 mmol/L (ref 3.3–5.1)
Potassium: 3.8 mmol/L (ref 3.3–5.1)
Sodium: 139 mmol/L (ref 133–145)
Sodium: 140 mmol/L (ref 133–145)
Sodium: 140 mmol/L (ref 133–145)
eGFR BY CREAT: 115 *
eGFR BY CREAT: 125 *
eGFR BY CREAT: 132 *

## 2022-04-07 LAB — CBC AND DIFFERENTIAL
Baso # K/uL: 0 10*3/uL (ref 0.0–0.1)
Baso # K/uL: 0 10*3/uL (ref 0.0–0.1)
Basophil %: 0.1 %
Basophil %: 0.1 %
Eos # K/uL: 0 10*3/uL (ref 0.0–0.4)
Eos # K/uL: 0 10*3/uL (ref 0.0–0.4)
Eosinophil %: 0 %
Eosinophil %: 0.2 %
Hematocrit: 35 % (ref 34–45)
Hematocrit: 39 % (ref 34–45)
Hemoglobin: 11.1 g/dL — ABNORMAL LOW (ref 11.2–15.7)
Hemoglobin: 12.8 g/dL (ref 11.2–15.7)
IMM Granulocytes #: 0 10*3/uL (ref 0.0–0.0)
IMM Granulocytes #: 0.1 10*3/uL — ABNORMAL HIGH (ref 0.0–0.0)
IMM Granulocytes: 0.3 %
IMM Granulocytes: 0.3 %
Lymph # K/uL: 2.6 10*3/uL (ref 1.2–3.7)
Lymph # K/uL: 2.9 10*3/uL (ref 1.2–3.7)
Lymphocyte %: 19.9 %
Lymphocyte %: 21.7 %
MCH: 29 pg (ref 26–32)
MCH: 30 pg (ref 26–32)
MCHC: 31 g/dL — ABNORMAL LOW (ref 32–36)
MCHC: 33 g/dL (ref 32–36)
MCV: 93 fL (ref 79–95)
MCV: 93 fL (ref 79–95)
Mono # K/uL: 0.9 10*3/uL (ref 0.2–0.9)
Mono # K/uL: 1.3 10*3/uL — ABNORMAL HIGH (ref 0.2–0.9)
Monocyte %: 7.5 %
Monocyte %: 8.5 %
Neut # K/uL: 10.5 10*3/uL — ABNORMAL HIGH (ref 1.6–6.1)
Neut # K/uL: 8.3 10*3/uL — ABNORMAL HIGH (ref 1.6–6.1)
Nucl RBC # K/uL: 0 10*3/uL (ref 0.0–0.0)
Nucl RBC # K/uL: 0 10*3/uL (ref 0.0–0.0)
Nucl RBC %: 0 /100 WBC (ref 0.0–0.2)
Nucl RBC %: 0 /100 WBC (ref 0.0–0.2)
Platelets: 66 10*3/uL — ABNORMAL LOW (ref 160–370)
Platelets: 81 10*3/uL — ABNORMAL LOW (ref 160–370)
RBC: 3.8 MIL/uL — ABNORMAL LOW (ref 3.9–5.2)
RBC: 4.2 MIL/uL (ref 3.9–5.2)
RDW: 15.1 % — ABNORMAL HIGH (ref 11.7–14.4)
RDW: 15.4 % — ABNORMAL HIGH (ref 11.7–14.4)
Seg Neut %: 70.2 %
Seg Neut %: 71.2 %
WBC: 11.8 10*3/uL — ABNORMAL HIGH (ref 4.0–10.0)
WBC: 14.7 10*3/uL — ABNORMAL HIGH (ref 4.0–10.0)

## 2022-04-07 LAB — MAGNESIUM
Magnesium: 1.8 mg/dL (ref 1.6–2.5)
Magnesium: 1.9 mg/dL (ref 1.6–2.5)

## 2022-04-07 LAB — COVID/INFLUENZA A & B/RSV NAAT (PCR)
COVID-19 NAAT (PCR): NEGATIVE
Influenza A NAAT (PCR): NEGATIVE
Influenza B NAAT (PCR): NEGATIVE
RSV NAAT (PCR): NEGATIVE

## 2022-04-07 LAB — PREGNANCY TEST, SERUM: Preg,Serum: NEGATIVE

## 2022-04-07 LAB — LACTATE, PLASMA: Lactate: 2.1 mmol/L (ref 0.5–2.2)

## 2022-04-07 LAB — PERFORMING LAB

## 2022-04-07 LAB — PHOSPHORUS: Phosphorus: 2.6 mg/dL — ABNORMAL LOW (ref 2.7–4.5)

## 2022-04-07 MED ORDER — SERTRALINE HCL 50 MG PO TABS *I*
50.0000 mg | ORAL_TABLET | Freq: Every day | ORAL | Status: DC
Start: 2022-04-07 — End: 2022-04-11
  Administered 2022-04-07 – 2022-04-10 (×4): 50 mg via ORAL
  Filled 2022-04-07 (×5): qty 1

## 2022-04-07 MED ORDER — CEFEPIME 2GM IN D5W 110 ML IVPB *I*
2000.0000 mg | Freq: Three times a day (TID) | INTRAVENOUS | Status: DC
Start: 2022-04-07 — End: 2022-04-10
  Administered 2022-04-07 – 2022-04-10 (×11): 2000 mg via INTRAVENOUS
  Filled 2022-04-07 (×2): qty 110
  Filled 2022-04-07: qty 2
  Filled 2022-04-07: qty 110
  Filled 2022-04-07: qty 2
  Filled 2022-04-07 (×8): qty 110

## 2022-04-07 MED ORDER — SODIUM CHLORIDE 0.9 % IV BOLUS *I*
1000.0000 mL | Freq: Once | Status: AC
Start: 2022-04-07 — End: 2022-04-07
  Administered 2022-04-07: 1000 mL via INTRAVENOUS

## 2022-04-07 MED ORDER — POTASSIUM CHLORIDE CRYS CR 20 MEQ PO TBCR *I*
40.0000 meq | ORAL_TABLET | Freq: Once | ORAL | Status: AC
Start: 2022-04-07 — End: 2022-04-07
  Administered 2022-04-07: 40 meq via ORAL
  Filled 2022-04-07: qty 2

## 2022-04-07 MED ORDER — CALCIUM CARBONATE ANTACID 500 MG PO CHEW *I*
1000.0000 mg | CHEWABLE_TABLET | Freq: Three times a day (TID) | ORAL | Status: DC | PRN
Start: 2022-04-07 — End: 2022-04-11

## 2022-04-07 MED ORDER — MELATONIN 3 MG PO TABS *I*
3.0000 mg | ORAL_TABLET | Freq: Every evening | ORAL | Status: DC | PRN
Start: 2022-04-07 — End: 2022-04-11

## 2022-04-07 MED ORDER — ACETAMINOPHEN 500 MG PO TABS *I*
1000.0000 mg | ORAL_TABLET | Freq: Three times a day (TID) | ORAL | Status: DC | PRN
Start: 2022-04-07 — End: 2022-04-11
  Administered 2022-04-08 – 2022-04-09 (×2): 1000 mg via ORAL
  Filled 2022-04-07 (×2): qty 2

## 2022-04-07 MED ORDER — POLYETHYLENE GLYCOL 3350 PO PACK 17 GM *I*
17.0000 g | PACK | ORAL | Status: DC
Start: 2022-04-08 — End: 2022-04-09
  Administered 2022-04-08: 17 g via ORAL

## 2022-04-07 MED ORDER — ONDANSETRON HCL 2 MG/ML IV SOLN *I*
4.0000 mg | Freq: Four times a day (QID) | INTRAMUSCULAR | Status: DC | PRN
Start: 2022-04-07 — End: 2022-04-11
  Administered 2022-04-08: 4 mg via INTRAVENOUS
  Filled 2022-04-07: qty 2

## 2022-04-07 MED ORDER — BISACODYL EC 5 MG PO TBEC *I*
10.0000 mg | DELAYED_RELEASE_TABLET | Freq: Every day | ORAL | Status: DC | PRN
Start: 2022-04-07 — End: 2022-04-11

## 2022-04-07 MED ORDER — POLYETHYLENE GLYCOL 3350 PO PACK 17 GM *I*
17.0000 g | PACK | Freq: Every day | ORAL | Status: DC
Start: 2022-04-07 — End: 2022-04-07
  Filled 2022-04-07: qty 17

## 2022-04-07 MED ORDER — SIMETHICONE 80 MG PO CHEW *I*
80.0000 mg | CHEWABLE_TABLET | Freq: Four times a day (QID) | ORAL | Status: DC | PRN
Start: 2022-04-07 — End: 2022-04-11

## 2022-04-07 MED ORDER — POTASSIUM PHOSPHATE 3 MMOL/ML ORAL LIQUID *I*
15.0000 mmol | Freq: Once | ORAL | Status: AC
Start: 2022-04-07 — End: 2022-04-07
  Administered 2022-04-07: 15 mmol via ORAL
  Filled 2022-04-07: qty 5

## 2022-04-07 MED ORDER — BISACODYL 10 MG RE SUPP *I*
10.0000 mg | Freq: Every day | RECTAL | Status: DC | PRN
Start: 2022-04-07 — End: 2022-04-11

## 2022-04-07 MED ORDER — BISACODYL 10 MG RE SUPP *I*
10.0000 mg | RECTAL | Status: DC
Start: 2022-04-07 — End: 2022-04-09
  Filled 2022-04-07 (×2): qty 1

## 2022-04-07 NOTE — Progress Notes (Signed)
04/07/22 1204   [facility] Patient Class Review   Patient Class Review Inpatient     Patient class effective as of 04/07/22.    Cherylynn Ridges, RN  Utilization Management  [phone]

## 2022-04-07 NOTE — ED Notes (Signed)
Pt at the moment resting no sing or symptoms of discomfort pt call light is as reach and educated.

## 2022-04-07 NOTE — ED Notes (Signed)
Pt moved into speciality bed at this time, pt is alert and appropriate. Pt has call bell within reach denies any other needs at this time

## 2022-04-07 NOTE — ED Notes (Signed)
Report Given To  Jaclyn Shaggy, RN      Descriptive Sentence / Reason for Admission   Pt via EMS w/ c/o chills, fatigue & poor PO intake over past few days. Concerned urine has foul order from urostomy. Concerned for UTI. Hx paraplegia, ulceration, DM, Autonomic dysfunction.      Active Issues / Relevant Events   Full code  AOx4  Paraplegic   WBC- 14.7  UTI  Was Febrile and hypotensive         To Do List  V/A  Med per mar       Anticipatory Guidance / Discharge Planning  Admit for viral GI illness

## 2022-04-07 NOTE — Bed Hold Note (Signed)
Bed: AC-19L  Expected date:   Expected time:   Means of arrival:   Comments:  1R
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
## 2022-04-07 NOTE — ED Notes (Signed)
Report Given To  15 L, RN      Descriptive Sentence / Reason for Admission   Pt via EMS w/ c/o chills, fatigue & poor PO intake over past few days. Concerned urine has foul order from urostomy. Concerned for UTI. Hx paraplegia, ulceration, DM, Autonomic dysfunction.      Active Issues / Relevant Events   Full code  AOx4  Paraplegic   On speciality air bed    WBC- 14.7  UTI        To Do List  Vital signs   assessments  Medicate per [facility]        Anticipatory Guidance / Discharge Planning  Admit for UTI

## 2022-04-07 NOTE — ED Notes (Signed)
Pt is alert and appropriate sitting up in bed talking on cell phone. Pt denies any needs at this time

## 2022-04-08 LAB — CBC AND DIFFERENTIAL
Baso # K/uL: 0 10*3/uL (ref 0.0–0.1)
Basophil %: 0.2 %
Eos # K/uL: 0 10*3/uL (ref 0.0–0.4)
Eosinophil %: 0.4 %
Hematocrit: 37 % (ref 34–45)
Hemoglobin: 11.9 g/dL (ref 11.2–15.7)
IMM Granulocytes #: 0 10*3/uL (ref 0.0–0.0)
IMM Granulocytes: 0.2 %
Lymph # K/uL: 2.3 10*3/uL (ref 1.2–3.7)
Lymphocyte %: 21.9 %
MCH: 30 pg (ref 26–32)
MCHC: 32 g/dL (ref 32–36)
MCV: 94 fL (ref 79–95)
Mono # K/uL: 0.8 10*3/uL (ref 0.2–0.9)
Monocyte %: 8 %
Neut # K/uL: 7.3 10*3/uL — ABNORMAL HIGH (ref 1.6–6.1)
Nucl RBC # K/uL: 0 10*3/uL (ref 0.0–0.0)
Nucl RBC %: 0 /100 WBC (ref 0.0–0.2)
Platelets: 67 10*3/uL — ABNORMAL LOW (ref 160–370)
RBC: 4 MIL/uL (ref 3.9–5.2)
RDW: 14.8 % — ABNORMAL HIGH (ref 11.7–14.4)
Seg Neut %: 69.3 %
WBC: 10.5 10*3/uL — ABNORMAL HIGH (ref 4.0–10.0)

## 2022-04-08 LAB — MAGNESIUM: Magnesium: 2 mg/dL (ref 1.6–2.5)

## 2022-04-08 LAB — BASIC METABOLIC PANEL
Anion Gap: 17 — ABNORMAL HIGH (ref 7–16)
CO2: 19 mmol/L — ABNORMAL LOW (ref 20–28)
Calcium: 9 mg/dL (ref 8.8–10.2)
Chloride: 105 mmol/L (ref 96–108)
Creatinine: 0.39 mg/dL — ABNORMAL LOW (ref 0.51–0.95)
Glucose: 110 mg/dL — ABNORMAL HIGH (ref 60–99)
Lab: 8 mg/dL (ref 6–20)
Potassium: 4 mmol/L (ref 3.3–5.1)
Sodium: 141 mmol/L (ref 133–145)
eGFR BY CREAT: 132 *

## 2022-04-08 LAB — PHOSPHORUS: Phosphorus: 2.4 mg/dL — ABNORMAL LOW (ref 2.7–4.5)

## 2022-04-08 MED ORDER — FONDAPARINUX SODIUM 2.5 MG/0.5ML SC SOLN *I*
2.5000 mg | Freq: Every day | SUBCUTANEOUS | Status: DC
Start: 2022-04-08 — End: 2022-04-11
  Administered 2022-04-08 – 2022-04-11 (×4): 2.5 mg via SUBCUTANEOUS
  Filled 2022-04-08 (×4): qty 0.5

## 2022-04-08 MED ORDER — LACTATED RINGERS IV BOLUS *I*
500.0000 mL | Freq: Once | INTRAVENOUS | Status: AC
Start: 2022-04-08 — End: 2022-04-08
  Administered 2022-04-08: 500 mL via INTRAVENOUS

## 2022-04-08 NOTE — ED Notes (Incomplete)
Report Given To  [facility] RN      Descriptive Sentence / Reason for Admission   Pt via EMS w/ c/o chills, fatigue & poor PO intake over past few days. Concerned urine has foul order from urostomy. Concerned for UTI. Hx paraplegia, ulceration, DM, Autonomic dysfunction.      Active Issues / Relevant Events   Full code  AOx4  Paraplegic   On speciality air bed    WBC- 14.7  UTI        To Do List  Vital signs   assessments  Medicate per [facility]        Anticipatory Guidance / Discharge Planning  Admit for UTI

## 2022-04-08 NOTE — Progress Notes (Signed)
Social Work Progress Note:    SW aware of consult orders, as patient is requesting assistance increasing her aide services.     SW attempted to meet with patient, staff at bedside assessing patient and new visitor had just arrived to. SW will return shortly.     Skip Mayer, [place]  Emergency Dept. Social Worker  Ex. [phone]

## 2022-04-08 NOTE — Consults (Signed)
Social Work Consult:    Sport and exercise psychologist: Patient, at bedside     SW met with patient at bedside. SW confirmed contact information and home address.     Patient expressed concern regarding a misunderstanding with staff while attending an appointment upstairs and said she may need a follow up or new clinic after d/c. Patient felt she was lied to, leading to a disagreement with staff. Patient addressed this with Patient and Family Relations, but was wondering if this writer was following up regarding that, SW explained we have not been informed of that situation. SW explained to patient the provider will let our care coordinator know of any appointments she may need after discharge and our care coordinator can help her get set up with an outpatient provider, if needed.     SW explained we were called to meet with patient to discuss her desire for increased aide services at home. Patient currently has 126 hours, but feels she needs more support. SW asked if she has address this with her [place], [place], [place]. Patient stated she had not heard from [facility] in a while and was unsure how to reach him.   SW reached out to [place], via secure chat, who provided SW his direct line for the patient to reach out. SW provided patient with contact info for her [facility].     Since patient is already established with aides, SW explained she will have to attempt to increase services through the community. SW encouraged her to talk to her PCP or [facility] to see if insurance will cover additional hours, or patient can hire private pay aides. SW provided patient with private pay aide resources.     SW will remain available    Skip Mayer, [place]  Emergency Dept. Social Worker  Ex. [phone]

## 2022-04-08 NOTE — ED Notes (Signed)
Report Given To  [facility] RN      Descriptive Sentence / Reason for Admission   Pt via EMS w/ c/o chills, fatigue & poor PO intake over past few days. Concerned urine has foul order from urostomy. Concerned for UTI. Hx paraplegia, ulceration, DM, Autonomic dysfunction.      Active Issues / Relevant Events   Full code  AOx4  Paraplegic   On speciality air bed    WBC- 14.7  UTI - ceftriaxone  Room air  Healed pressure injury to sacrum, bilat knees  Shakes at baseline  Urostomy to drain bag        To Do List  Vital signs   assessments  Medicate per [facility]        Anticipatory Guidance / Discharge Planning  Admit for UTI
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
## 2022-04-08 NOTE — Plan of Care (Signed)
Seen this AM with attending     Sepsis 2/2 to UTI   - continue cefepime   - repeat blood cx.  MRSE bacteremia.  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  Seen by SW re additional aides services.      Case discussed with attending, SW, CC    Zannie [place] med [place]

## 2022-04-09 ENCOUNTER — Other Ambulatory Visit: Payer: Self-pay

## 2022-04-09 DIAGNOSIS — D696 Thrombocytopenia, unspecified: Secondary | ICD-10-CM | POA: Diagnosis present

## 2022-04-09 LAB — CBC AND DIFFERENTIAL
Baso # K/uL: 0 10*3/uL (ref 0.0–0.1)
Basophil %: 0.1 %
Eos # K/uL: 0 10*3/uL (ref 0.0–0.4)
Eosinophil %: 0.2 %
Hematocrit: 35 % (ref 34–45)
Hemoglobin: 11.3 g/dL (ref 11.2–15.7)
IMM Granulocytes #: 0 10*3/uL (ref 0.0–0.0)
IMM Granulocytes: 0.2 %
Lymph # K/uL: 2.7 10*3/uL (ref 1.2–3.7)
Lymphocyte %: 30.8 %
MCH: 30 pg (ref 26–32)
MCHC: 32 g/dL (ref 32–36)
MCV: 93 fL (ref 79–95)
Mono # K/uL: 0.7 10*3/uL (ref 0.2–0.9)
Monocyte %: 8.6 %
Neut # K/uL: 5.2 10*3/uL (ref 1.6–6.1)
Nucl RBC # K/uL: 0 10*3/uL (ref 0.0–0.0)
Nucl RBC %: 0 /100 WBC (ref 0.0–0.2)
Platelets: 77 10*3/uL — ABNORMAL LOW (ref 160–370)
RBC: 3.8 MIL/uL — ABNORMAL LOW (ref 3.9–5.2)
RDW: 14.6 % — ABNORMAL HIGH (ref 11.7–14.4)
Seg Neut %: 60.1 %
WBC: 8.6 10*3/uL (ref 4.0–10.0)

## 2022-04-09 LAB — BASIC METABOLIC PANEL
Anion Gap: 17 — ABNORMAL HIGH (ref 7–16)
CO2: 19 mmol/L — ABNORMAL LOW (ref 20–28)
Calcium: 8.9 mg/dL (ref 8.8–10.2)
Chloride: 103 mmol/L (ref 96–108)
Creatinine: 0.38 mg/dL — ABNORMAL LOW (ref 0.51–0.95)
Glucose: 90 mg/dL (ref 60–99)
Lab: 7 mg/dL (ref 6–20)
Potassium: 3.4 mmol/L (ref 3.3–5.1)
Sodium: 139 mmol/L (ref 133–145)
eGFR BY CREAT: 132 *

## 2022-04-09 LAB — MAGNESIUM: Magnesium: 1.8 mg/dL (ref 1.6–2.5)

## 2022-04-09 LAB — PHOSPHORUS: Phosphorus: 3.5 mg/dL (ref 2.7–4.5)

## 2022-04-09 MED ORDER — GLYCERIN (ADULT) 2 GM RE SUPP *I*
1.0000 | Freq: Once | RECTAL | Status: DC
Start: 2022-04-09 — End: 2022-04-10
  Filled 2022-04-09: qty 1

## 2022-04-09 MED ORDER — BISACODYL 10 MG RE SUPP *I*
10.0000 mg | RECTAL | Status: DC
Start: 2022-04-09 — End: 2022-04-11
  Administered 2022-04-09: 10 mg via RECTAL
  Filled 2022-04-09 (×2): qty 1

## 2022-04-09 MED ORDER — POLYETHYLENE GLYCOL 3350 PO PACK 17 GM *I*
17.0000 g | PACK | ORAL | Status: DC
Start: 2022-04-09 — End: 2022-04-11
  Filled 2022-04-09: qty 17

## 2022-04-09 MED ORDER — SODIUM CHLORIDE 0.9 % IV BOLUS *I*
500.0000 mL | Freq: Once | Status: AC
Start: 2022-04-09 — End: 2022-04-09
  Administered 2022-04-09: 500 mL via INTRAVENOUS

## 2022-04-09 MED ORDER — BISACODYL EC 5 MG PO TBEC *I*
10.0000 mg | DELAYED_RELEASE_TABLET | Freq: Once | ORAL | Status: AC
Start: 2022-04-09 — End: 2022-04-09
  Administered 2022-04-09: 10 mg via ORAL
  Filled 2022-04-09: qty 2

## 2022-04-09 NOTE — Progress Notes (Addendum)
Patient admitted to [address], patient arriving via specialty mattress.    VS stable, no complaints of pain, A&Ox4    Belongings: clothing, smart phone, phone charger    4 eyed skin assessment completed with Roselee Nova, LPN. Pressure injury noted to the patient right ischium. Wound cleansed with normal saline and a 4x4 mepilex dressing applied. No other areas of concern noted.     US guided IV from the ED was infiltrated. IV removed by writer upon admission. IV team paged to regain IV access.     Interventions done: Drinks provided, linens changed, wound care provided.    Wound consult ordered.    Pt stated that she understands that there are orders for staff to perform q2 hour turns, however she says she "is refusing all turns now". Patient says she understands that repositioning helps to prevent skin breakdown but she states "I have very little control over things, and this is something I can control". Patient also refusing ordered suppository and enema saying she would rather have it in the morning.    Patient oriented to unit and call bell system. Fall precautions in place. Patient resting comfortably in bed at this time.       Annett Fabian, RN

## 2022-04-09 NOTE — ED Notes (Signed)
Report Given To  Katharine Look RN      Descriptive Sentence / Reason for Admission   Pt via EMS w/ c/o chills, fatigue & poor PO intake over past few days. Concerned urine has foul order from urostomy. Concerned for UTI. Hx paraplegia, ulceration, DM, Autonomic dysfunction.      Active Issues / Relevant Events   Full code  AOx4  Paraplegic   On speciality air bed    WBC- 14.7->10.5  UTI - ceftriaxone  Room air  Healed pressure injury to sacrum, bilat knees  Shakes at baseline  Urostomy to drain bag,   Keep bag on right side of bed        To Do List  Vital signs   assessments  Medicate per [facility]        Anticipatory Guidance / Discharge Planning  Admit for UTI

## 2022-04-09 NOTE — ED Notes (Signed)
Report Given To  Pleas Koch, RN      Descriptive Sentence / Reason for Admission   Pt via EMS w/ c/o chills, fatigue & poor PO intake over past few days. Concerned urine has foul order from urostomy. Concerned for UTI. Hx paraplegia, ulceration, DM, Autonomic dysfunction.      Active Issues / Relevant Events   Full code  AOx4  Paraplegic   On speciality air bed    + blood cultures  WBC- 14.7->10.5  UTI - ceftriaxone  Room air  Healed pressure injury to sacrum, bilat knees  Shakes at baseline  Urostomy to drain bag,   Keep bag on right side of bed        To Do List  Vital signs   assessments  Medicate per [facility]        Anticipatory Guidance / Discharge Planning  Admit for UTI
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
## 2022-04-09 NOTE — ED Notes (Signed)
Report Given To  Rod Holler, RN      Descriptive Sentence / Reason for Admission   Pt via EMS w/ c/o chills, fatigue & poor PO intake over past few days. Concerned urine has foul odor from urostomy. Concerned for UTI. Hx paraplegia, ulceration, DM, Autonomic dysfunction.      Active Issues / Relevant Events   Full code  AOx4  Paraplegic   On speciality air bed  + blood cultures  WBC- 14.7->10.5  UTI - ceftriaxone  Room air  Healed pressure injury to sacrum, bilat knees  Shakes at baseline  Urostomy to drain bag,   Keep bag on right side of bed      To Do List  Vital signs   assessments  Medicate per [facility]      Anticipatory Guidance / Discharge Planning  Admit for UTI and URI

## 2022-04-10 DIAGNOSIS — D696 Thrombocytopenia, unspecified: Secondary | ICD-10-CM

## 2022-04-10 DIAGNOSIS — A4151 Sepsis due to Escherichia coli [E. coli]: Secondary | ICD-10-CM

## 2022-04-10 DIAGNOSIS — F32A Depression, unspecified: Secondary | ICD-10-CM

## 2022-04-10 DIAGNOSIS — K5909 Other constipation: Secondary | ICD-10-CM

## 2022-04-10 LAB — CBC AND DIFFERENTIAL
Baso # K/uL: 0 10*3/uL (ref 0.0–0.1)
Basophil %: 0 %
Eos # K/uL: 0.1 10*3/uL (ref 0.0–0.4)
Eosinophil %: 0.9 %
Hematocrit: 36 % (ref 34–45)
Hemoglobin: 11.5 g/dL (ref 11.2–15.7)
Lymph # K/uL: 3.2 10*3/uL (ref 1.2–3.7)
Lymphocyte %: 32.2 %
MCH: 30 pg (ref 26–32)
MCHC: 32 g/dL (ref 32–36)
MCV: 92 fL (ref 79–95)
Mono # K/uL: 1.2 10*3/uL — ABNORMAL HIGH (ref 0.2–0.9)
Monocyte %: 12.2 %
Neut # K/uL: 5.5 10*3/uL (ref 1.6–6.1)
Nucl RBC # K/uL: 0 10*3/uL (ref 0.0–0.0)
Nucl RBC %: 0 /100 WBC (ref 0.0–0.2)
Platelets: 97 10*3/uL — ABNORMAL LOW (ref 160–370)
RBC: 3.9 MIL/uL (ref 3.9–5.2)
RDW: 14.2 % (ref 11.7–14.4)
Seg Neut %: 54.7 %
WBC: 9.9 10*3/uL (ref 4.0–10.0)

## 2022-04-10 LAB — BASIC METABOLIC PANEL
Anion Gap: 16 (ref 7–16)
CO2: 21 mmol/L (ref 20–28)
Calcium: 9.4 mg/dL (ref 8.8–10.2)
Chloride: 99 mmol/L (ref 96–108)
Creatinine: 0.44 mg/dL — ABNORMAL LOW (ref 0.51–0.95)
Glucose: 111 mg/dL — ABNORMAL HIGH (ref 60–99)
Lab: 9 mg/dL (ref 6–20)
Potassium: 3.7 mmol/L (ref 3.3–5.1)
Sodium: 136 mmol/L (ref 133–145)
eGFR BY CREAT: 128 *

## 2022-04-10 LAB — LACTATE, PLASMA: Lactate: 0.8 mmol/L (ref 0.5–2.2)

## 2022-04-10 LAB — DIFF MANUAL: Diff Based On: 115 CELLS

## 2022-04-10 LAB — BLOOD CULTURE

## 2022-04-10 LAB — MAGNESIUM: Magnesium: 1.8 mg/dL (ref 1.6–2.5)

## 2022-04-10 MED ORDER — CEPHALEXIN 250 MG PO CAPS *I*
500.0000 mg | ORAL_CAPSULE | Freq: Two times a day (BID) | ORAL | Status: DC
Start: 2022-04-10 — End: 2022-04-11
  Administered 2022-04-10 – 2022-04-11 (×2): 500 mg via ORAL
  Filled 2022-04-10 (×2): qty 2

## 2022-04-10 MED ORDER — CEPHALEXIN 250 MG PO CAPS *I*
500.0000 mg | ORAL_CAPSULE | Freq: Four times a day (QID) | ORAL | Status: DC
Start: 2022-04-10 — End: 2022-04-10
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
## 2022-04-10 NOTE — Continuity of Care (Signed)
Chart reviewed for discharge planning needs.  ACC will continue to follow patient's hospital course until discharge plans are finalized.     Lilienne Weins RN, Acute Care Coordinator, [phone]  Weekend covering pager #[phone]

## 2022-04-10 NOTE — Consults (Signed)
Medical Nutrition Therapy - Initial Assessment    Admit Date: 04/06/2022    Reason for consult: Triggered by nursing risk screen    Patient Summary: Cindy Ochoa is a 36 y.o. 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 Patient states symptoms started over the weekend. She states that her chills provider was sick with any stomach bug and she may have caught it from her.  She mentions that she has been having symptoms of nausea, vomiting and decreased p.o. intake.  She states that today she could not take it anymore and decided to come to the hospital.  She denies any fevers or chills at home.  She arrives AOx3, hypotensive, tachycardic and febrile.  She states that she has a history of UTIs and has noticed that her urostomy bag is more cloudy than usual.    Pertinent Social Hx: Pt lives at home and has aide services available 126 hours a week. The remaining help comes from her son who is about to turn 67. When her son turns 14 she says she will need to find 24/7 care so he doesn't have to keep caring for her.    Pertinent Meds: reviewed   Pertinent Labs: reviewed; PO4 repleted on admission     Reviewed I/O's Adequate urine output noted. No BM reported.    Enteral or parenteral access: PIV    Nutrition Hx: Pt reports not eating since last Friday. Training and development officer that she is "nit picking to get something into her" and reports no N/V/D. Requesting Ensure BID.    Food allergies: NKFA    Current diet: Regular    Nutrition Focused Physical Exam:  Edema: None  Abdomen: WDL  Skin: WDL  Body fat stores: Adequate stores  Lean body mass stores: Adequate stores    Anthropometrics:  Height: 165.1 cm (5\' 5" )    Admit/Current Weight: 64.9 kg (143 lb)   BMI should not be used to measure body composition in persons with a spinal cord injury resulting in  quadriplegia or paraplegia. These methods may not provide reliable results since they were developed based on able-bodied persons.    Weight Hx: Per weight hx, weight loss not quantified. Pt reports possibility of weight loss d/t "not eating since last Friday."  WEIGHTS Weight (metric) Weight (standard)   04/06/2022 64.864 kg 143 lbs   12/30/2021 65.772 kg 145 lbs   09/19/2021 63.504 kg 140 lbs   08/20/2021 63.504 kg 140 lbs   07/17/2021 62.143 kg 137 lbs   07/09/2021 61.236 kg 135 lbs   06/19/2021 62.596 kg 138 lbs   05/29/2021 63.504 kg 140 lbs   04/16/2021 61.236 kg  135 lbs      Estimated Nutrient Needs: (Based on 64.9 kg)    1625-1950 kcal/day (25-30 kcal/kg)   52-65 g protein/day (0.8-1.0 g/kg)    1625-1950 mL fluid/day (25-30 mL/kg)      Nutrition Assessment and Diagnosis:   Inadequate oral intake related to current medical condition as evidenced by poor intake, nausea, and vomiting.     Reports improved intake since admission.     Malnutrition Status: Does not meet 2 or more (of 6) criteria for either moderate or severe protein-calorie malnutrition     Nutrition Intervention:   1. Continue Regular diet. Encourage small, frequent meals.  2. Please add Ensure High Protein BID  3. Please obtain admission weight  and monitor weekly thereafter.  4. Monitor Labs, I&Os, and % consumptions.    Nutrition Monitoring/Evaluation:   1. Will monitor diet tolerance and intake, nutrition-related labs, weight trend, BM pattern.  2. Nutrition to follow up per moderate nutrition risk protocol.    Dairl Ponder Blood, RD  Clinical Dietitian  [facility] [phone]

## 2022-04-10 NOTE — Progress Notes (Signed)
Pt seen and evaluated with attending this afternoon.  NAE.  No complaints.  Had small BM.  Hopeful for discharge home, says tomorrow would be better for arranging aide service.    Gen: WDWN female NAD pleasant    Sepsis   -2/2 UTI  -improved  -Klebsiella sensitive to cefazolin and bactrim, E coli sensitivity pending  -start keflex    Depression  -continue zoloft    MRSE bacteremia  -suspect contaminant  -repeats NGTD    Thrombocytopenia  -improving    DVT ppx: fondaparinux    Dispo: home, anticipate tomorrow    D/w Dr. Jeannette Corpus.    Lisbeth Ply, PA  5:59 PM

## 2022-04-10 NOTE — Progress Notes (Signed)
Report Given To  Laqueta Due, RN      Descriptive Sentence / Reason for Admission   CC: Poor appetite and fatigue since 4/7. Found to be septic with UA suggestive of UTI  HX: quadriparesis at c6, neurogenic bladder s/p urostomy, neurogenic bowel, autonomic dysfunction, osteoporosis, depression, constipation     FULL CODE      Active Issues / Relevant Events   Family member contact and phone # =  pin # =   A&Ox4, meds whole, fall risk*  Cam screen: positive/negative  Pain  Ambulatory status: Hover/hoyer  O2: RA    4/14 ON: Admitted to 218.   4/14 N: New Right Forearm PIV in place, Pt anxious about drawing Bl from it for culture. MD is aware.  4/14 D: blood cultures drawn. WOCN consult. Iv abx swithced to PO. Discharge in the AM? BM x2      To Do List  PT/OT  Labs  VS q4  Needs set up with meals  Consults  I&Os  Turn and position every two hours and PRN  Fusion mattress  Offloading:  Use Prevalon boots--with each assessment and reassessment please remove to visualize skin then replace  Topical recommendations:    R ischium - Cleanse wound bed with normal saline, gently dry. Apply Triad hydrophilic wound paste to affected skin, cover with Mepilex border foam dressing. Change every day & PRN    Ezequiel Kayser, RN

## 2022-04-10 NOTE — Consults (Signed)
Wound Consult Note, Initial    [phone]    [facility]  36 y.o.  female was referred by nursing staff for evaluation and treatment recommendations regarding: concern for POA chronic pressure injury of R ischium.   HPI, PMH/PSH, Labs, MAR reviewed.    Wound first identified/assessed on >1 year ago, per pt (notes from 2021 discuss site re-opening)    Wound associated complaints:    []  Pain  []  Burning   []  Itching  []  Odor  [x]  No complaints at this time    Focused Exam    Patient alert, awake, NAD, able to turn to side independently.  Head-to-toe skin assessment: visualized sacrococcygeal region & BLE.  Last Braden risk assessment score 13 (at risk for skin breakdown)  Current mattress type: fusion      R ischium    Wound #1:   Location: R ischium   Stage: (healing) 3   Underlying etiology/wound type: pressure injury  Measurements:  1 x 0.2 x 1 cm  Undermining:  None  Tunneling: None  Wound bed:  UTA (opening too narrow)  Drainage:  Scant serosanguinous  Edges:  epibole  Periwound skin:  Macerated callus, thick   Odor after cleansing: None      Patient education re prevention/treatment of wounds  Care as described below    Assessment  Chronic pressure injury of R ischium, typically maintained by mother and in-home care aides. Pt states usual care routine is Calmoseptine to wound edges, covered by foam dressing changed daily before shower. Pt receptive to trialing Triad in similar manner--if ineffective or worsening, OK to change back to typical home regimen.    Plan    Turn and position every two hours and PRN  Fusion mattress  Offloading:  Use Prevalon boots--with each assessment and reassessment please remove to visualize skin then replace  Topical recommendations:    ? R ischium - Cleanse wound bed with normal saline, gently dry. Apply Triad hydrophilic wound paste to affected skin, cover with Mepilex border foam dressing. Change every day & PRN      Thank-you very much for consult.  Wound Nurse will plan  follow-up as needed.     Consuela Mimes  BSN RN CWOCN  [facility] [phone]    ?Everette Rank., RN,certify that the patient has authorized the use of photography for the purpose of the provision of health care at the [facility] and affiliates and they understand that it will be included in the legal medical record.?

## 2022-04-11 ENCOUNTER — Other Ambulatory Visit: Payer: Self-pay

## 2022-04-11 DIAGNOSIS — E119 Type 2 diabetes mellitus without complications: Secondary | ICD-10-CM

## 2022-04-11 LAB — BASIC METABOLIC PANEL
Anion Gap: 15 (ref 7–16)
CO2: 21 mmol/L (ref 20–28)
Calcium: 9.6 mg/dL (ref 8.8–10.2)
Chloride: 103 mmol/L (ref 96–108)
Creatinine: 0.36 mg/dL — ABNORMAL LOW (ref 0.51–0.95)
Glucose: 106 mg/dL — ABNORMAL HIGH (ref 60–99)
Lab: 8 mg/dL (ref 6–20)
Potassium: 3.8 mmol/L (ref 3.3–5.1)
Sodium: 139 mmol/L (ref 133–145)
eGFR BY CREAT: 134 *

## 2022-04-11 LAB — CBC AND DIFFERENTIAL
Baso # K/uL: 0 10*3/uL (ref 0.0–0.1)
Basophil %: 0.4 %
Eos # K/uL: 0 10*3/uL (ref 0.0–0.4)
Eosinophil %: 0.2 %
Hematocrit: 36 % (ref 34–45)
Hemoglobin: 11.5 g/dL (ref 11.2–15.7)
IMM Granulocytes #: 0 10*3/uL (ref 0.0–0.0)
IMM Granulocytes: 0.4 %
Lymph # K/uL: 2.1 10*3/uL (ref 1.2–3.7)
Lymphocyte %: 25.1 %
MCH: 30 pg (ref 26–32)
MCHC: 32 g/dL (ref 32–36)
MCV: 94 fL (ref 79–95)
Mono # K/uL: 0.6 10*3/uL (ref 0.2–0.9)
Monocyte %: 7.1 %
Neut # K/uL: 5.7 10*3/uL (ref 1.6–6.1)
Nucl RBC # K/uL: 0 10*3/uL (ref 0.0–0.0)
Nucl RBC %: 0 /100 WBC (ref 0.0–0.2)
Platelets: 110 10*3/uL — ABNORMAL LOW (ref 160–370)
RBC: 3.9 MIL/uL (ref 3.9–5.2)
RDW: 14.3 % (ref 11.7–14.4)
Seg Neut %: 66.8 %
WBC: 8.5 10*3/uL (ref 4.0–10.0)

## 2022-04-11 LAB — MAGNESIUM: Magnesium: 1.8 mg/dL (ref 1.6–2.5)

## 2022-04-11 LAB — AEROBIC CULTURE

## 2022-04-11 MED ORDER — CEPHALEXIN 500 MG PO CAPS *I*
500.0000 mg | ORAL_CAPSULE | Freq: Four times a day (QID) | ORAL | 0 refills | Status: DC
Start: 2022-04-11 — End: 2022-04-30

## 2022-04-11 MED ORDER — BISACODYL 10 MG RE SUPP *I*
10.0000 mg | RECTAL | Status: DC
Start: 2022-04-11 — End: 2022-04-30

## 2022-04-11 MED ORDER — POLYETHYLENE GLYCOL 3350 PO POWD *I*
17.0000 g | Freq: Every day | ORAL | Status: DC | PRN
Start: 2022-04-11 — End: 2022-04-30

## 2022-04-11 MED ORDER — CEPHALEXIN 250 MG PO CAPS *I*
500.0000 mg | ORAL_CAPSULE | Freq: Four times a day (QID) | ORAL | Status: DC
Start: 2022-04-11 — End: 2022-04-11

## 2022-04-11 NOTE — Progress Notes (Signed)
Waiting on pts w/c from home and hoyer pad that fits their hoyer at home.

## 2022-04-11 NOTE — Progress Notes (Signed)
Report Given To  Tommy Rainwater RN      Descriptive Sentence / Reason for Admission   CC: Poor appetite and fatigue since 4/7. Found to be septic with UA suggestive of UTI  HX: quadriparesis at c6, neurogenic bladder s/p urostomy, neurogenic bowel, autonomic dysfunction, osteoporosis, depression, constipation     FULL CODE      Active Issues / Relevant Events   Family member contact and phone # =  pin # =   A&Ox4, meds whole, fall risk*  Cam screen: positive/negative  Pain  Ambulatory status: Hover/hoyer  O2: RA    4/14 N: New Right Forearm PIV in place, Pt anxious about drawing Bl from it for culture. MD is aware.  4/14 D: blood cultures drawn. WOCN consult. Iv abx swithced to PO. Discharge in the AM? BM x2  4/15 N: Possible discharge today. Refusing turns, educated on skin breakdown, continues to refuse.       To Do List  PT/OT  Labs  VS q4  Needs set up with meals  Consults  I&Os  Turn and position every two hours and PRN  Fusion mattress  Offloading:  Use Prevalon boots--with each assessment and reassessment please remove to visualize skin then replace  Topical recommendations:    R ischium - Cleanse wound bed with normal saline, gently dry. Apply Triad hydrophilic wound paste to affected skin, cover with Mepilex border foam dressing. Change every day & PRN        Anticipatory Guidance / Discharge Planning  NO glasses, dentures, hearing aides   2 identified visitors:  Where from: Home with assistance   Placement:  Transportation home:  Discharge date:   LOS:

## 2022-04-11 NOTE — Discharge Instructions (Addendum)
Your diagnosis was:   Sepsis due to urinary tract infection    What was done:   You were admitted to the hospital for evaluation and treatment of fatigue and poor appetite. You were evaluated with blood test and imaging. Testing revealed sepsis. Further testing showed it was caused by a urinary tract infection. You were treated with antibiotics. Your symptoms gradually improved. You are safe for discharge home today.    What do I need to do:      Take medications as prescribed     Follow-up with Dr. Celine Mans for hospital follow-up    Recommended diet: Regular - No restrictions    Recommended activity: activity as tolerated    Wound Care:    Can continue current wound care or trial the below recommendations from inpatient wound care nurse:  Right ischium - Cleanse wound bed with normal saline, gently dry. Apply Triad hydrophilic wound paste to affected skin, cover with Mepilex border foam dressing. Change every day & as needed    If you experience any of these symptoms 24 hours or more after discharge:Uncontrolled pain, Chest pain, Shortness of breath, Fever of 101 F. or greater, Chills, Poor appetite, Poor urinary output, Vomiting, Nausea, Diarrhea, Blood in stool or Weakness  Call Dr. Fransico Setters    If you experience any of these symptoms within the first 24 hours after discharge call Dr. Smith Robert at [phone]    Medications  Your doctor has prescribed medications to improve or manage your condition.  You should take them as prescribed by your doctor.  Ask your doctor for any questions regarding these medications.    What to do if your condition changes?  If at any time you have any questions or concerns or your condition gets worse, contact your physician.  If you can not reach your physician or you develop life threatening symptoms such as trouble breathing or chest pain you should go to the closest Emergency Department.

## 2022-04-11 NOTE — Progress Notes (Signed)
Pt d/ced to home. Sister is here to take home with her w/c. Will need to be hoyered to w/c. D/c paperwork gone over. IV removed. Pt has all belongings.

## 2022-04-12 ENCOUNTER — Other Ambulatory Visit: Payer: Self-pay

## 2022-04-12 NOTE — Progress Notes (Signed)
Review of Most Recent Patient Discharge:       Date of Discharge:   04/11/22    Risk of Hospital or ED Admission Score:     Risk of Admission or ED Visit  Current as of 7 hours ago      44% 40 - 100%: High Risk   20 - 40%: Medium Risk   0 - 20%: Low Risk     Last Change:         This score indicates an adult patient's 1-year risk, as a percentage, of a hospital admission or ED visit.             Current PCP:  Frances Nickels, MD              Reason for Admission:     Sepsis    Discharge Unit:   [id number]     Most recent discharge disposition:  Home or Self Care    Last 3 COVID-19 Results:     Lab Results   Component Value Date    COVID19PCR NEGATIVE 04/07/2022    COVID19PCR NEGATIVE 11/01/2021    COVID19PCR NEGATIVE 06/18/2021    COVID19PCR . 06/02/2019        On Coumadin or Warfarin:  no      Health Home Care Manager:     [facility] ZO:XWRUE, Paulita Fujita    [facility] External CM:     If blank, none documented on patient care team.       Follow up Appointments at [facility] or [facility]:      Appt at [facility] Scheduled on: 04/14/2022    If blank, none scheduled at time of this note.       Cindy Ochoa

## 2022-04-13 LAB — BLOOD CULTURE: Bacterial Blood Culture: 0

## 2022-04-13 NOTE — Progress Notes (Signed)
Clinical Care Management: Message noted and no further Clinical Care Manager follow-up necessary at this time. Appreciate Health Home Care Manager follow-up with patient.

## 2022-04-13 NOTE — Telephone Encounter (Signed)
Medication request routed to Dr. Celine Mans for processing 04/13/2022 6:41 AM    If appropriate, please refill the script for 90 days.

## 2022-04-13 NOTE — Progress Notes (Deleted)
[facility]       Reason for visit: There were no encounter diagnoses.      HPI:  Cindy Ochoa is 36 y.o. year old female with history of diabetes, quadriplegic at C6, neurogenic bladder, autonomic dysfunction, osteoporosis frequent UTIs, urostomy tube    Last seen at [facility] 03/06/22  [facility] Hospitalization 4/10- 4/15 -     Social :    Medications:     Current Outpatient Medications   Medication Sig   . polyethylene glycol (GLYCOLAX) powder Take 17 g by mouth daily as needed  Mix in 8 oz water or juice and drink.   . bisacodyl (DULCOLAX) 10 mg suppository Place 1 suppository (10 mg total) rectally every other day   . cephalexin (KEFLEX) 500 mg capsule Take 1 capsule (500 mg total) by mouth every 6 hours  for Inflammation of Bladder due to Infection   . sertraline (ZOLOFT) 50 mg tablet Take 1 tablet (50 mg total) by mouth daily   . glucagon (BAQSIMI) 3 MG/DOSE nasal powder Inhale one dose (3 mg) into one nostril once as needed for low blood sugar. If no response in 15 min, inhale second dose from new device   . acetaminophen (MAPAP) 500 mg tablet Take 2 tablets (1,000 mg total) by mouth every 8 hours as needed for Pain or Fever  for pain   . melatonin 3 mg Take 1 tablet (3 mg total) by mouth nightly as needed for Sleep   . Blood Glucose Monitoring Suppl (FREESTYLE LITE) w/Device KIT Use to test blood sugar as directed   . generic DME Dispense: chux pads   ICD-10: N39.41  Duration: Lifetime  Ht Readings from Last 1 Encounters:  04/16/21 : 1.626 m (5\' 4" )   Wt Readings from Last 1 Encounters:  04/16/21 : 61.2 kg (135 lb)   . generic DME Dispense: briefs   ICD-10: N39.41  Duration: Lifetime  Ht Readings from Last 1 Encounters:  04/16/21 : 1.626 m (5\' 4" )   Wt Readings from Last 1 Encounters:  04/16/21 : 61.2 kg (135 lb)   . Non-System Medication Chux pads  Ht: 5\' 5"  Wt: 143lbs  ICD code: N39.41   . Non-System Medication Briefs/pullups, large  Ht: 5\' 5"  Wt: 143lbs  ICD code: N39.41   . Non-System Medication  Motorized wheelchair-   Lifetime 99  Quad c-6   . Non-System Medication Nebulizer with tubing and mask  RO 689  Respiratory insufficiency   . Non-System Medication Portable suction  respiratory insufficiency  Tracheotomy   Quadriparesis   . generic DME Suction machine with all related supplies.   . Non-System Medication Large underpads  Ht: 5\' 5"  Wt: 143lbs  ICD code: N39.41   . generic DME 1/2 inch plain packing gauze   . generic DME Dispense: ROHO  Indication: Quadriplegia   ICD-10: R53.2  Duration: Lifetime  Ht Readings from Last 1 Encounters:  03/19/20 : 1.626 m (5\' 4" )   Wt Readings from Last 1 Encounters:  03/19/20 : 63.5 kg (140 lb)   . Non-System Medication Gloves  Ht: 5\' 5"  Wt: 143lbs  ICD code: N39.41   . disposable underpads 30"x36" (CHUX) Use 6 times daily and PRN. Dx N39.42  Incontinence without sensory awareness   . incontinence supply disposable Large pull ups - use up to 5 x daily  Dx N39.46   . disposable gloves 1 box Dynarex PF Vinyl Gloves   . patient lift Use as directed for patient lifting.  For lifetime use; ICD 10: G82.54 Ht: 1.27m Wt: 76.2 kg   . adjustable bath/shower seat with back For lifetime use; ICD 10: G82.54 Ht: 1.5m Wt: 76.2 kg   . Non-System Medication Urostomy drainage bags 2000 cc change as needed. Dx N39.46 and  G82.54   . generic DME Wafers  Dx code: N31.9  Qty: 10  Refills: 6   . generic DME Urostomy Pouches  Dx Codes(s): N31.9  Qty: 20  Refills: 6   . generic DME Leg bag  Dx Code: N31.9  Qty: 4  Refills: 6   . generic DME Night drainage bag  Dx Code: N31.9  Qty: 4  Refills: 6   . generic DME Barrier rings  Dx Code: N31.9  Qty: 15  Refills: 6   . Ostomy Supplies [employer] Urostomy two piece bag and wafer 1 1/4 "H  7/8 "V N31.9 Neuromuscular dysfunction of the bladder   . Ostomy Supplies [facility] [employer] Urostomy two piece bag and wafer 1 1/4 "H  7/8 "V N31.9 Neuromuscular dysfunction of the bladder. Use as directed.   Sherren Mocha Supplies Pouch MISC Coloplast Urostomy two  piece bag and wafer 1 1/4 "H  7/8 "V N31.9 Neuromuscular dysfunction of the bladder Use as directed   . Ostomy Supplies MISC Barrier ring. Use as directed.   Marland Kitchen generic DME Repairs to hospital bed/hoyer lift  ICD 10: G82.54   Ht: 1.40m Wt: 76.2 kg   . disposable gloves 2 boxes Disposable Medium size gloves N39.46   . generic DME Urostomy drainage bags 2000 cc change as needed. Dx N39.46 and  G82.54   . Gauze Pads & Dressings (ABDOMINAL PAD) 8"X10" PADS By 1 each no specified route daily   Cover buttock wounds 2x daily   . Non-System Medication Gel overlay mattress for hospital bed - diagnosis G82.54, L89.93, L89.159   . Non-System Medication Easy Tip Leg Bags 1000mg  - diagnosis G82.53 N39.46   . etonogestrel (IMPLANON, NEXPLANON) 68 MG IMPL Inject 68 mg into the skin once  Placed 10/20/16     No current facility-administered medications for this visit.       Medication list reconciled this visit    Allergies:     Allergies   Allergen Reactions   . Heparin Other (See Comments)     Thrombocytopenia; HIT   . Ibuprofen Swelling     Pt reports lip swelling after taking ibuprofen.    . Nitrofurantoin Nausea And Vomiting       Social history      Social History     Tobacco Use   . Smoking status: Some Days     Packs/day: 0.25     Years: 3.00     Pack years: 0.75     Types: Cigarettes     Start date: 12/09/2011   . Smokeless tobacco: Former     Quit date: 04/29/2005   . Tobacco comments:     3 cigarettes a day   Vaping Use   . Vaping status: Not on file   Substance Use Topics   . Alcohol use: Not Currently       Review of Systems     ROS  12 point review of system was negative except those mentioned in HPI    Physical Exam:     Physical Exam       There were no vitals filed for this visit.  Wt Readings from Last 3 Encounters:   04/06/22 64.9 kg (143 lb)  01/21/22 65.8 kg (145 lb)   12/30/21 65.8 kg (145 lb)     BP Readings from Last 3 Encounters:   04/11/22 (!) 136/96   03/06/22 122/87   02/16/22 134/76            ASSESSMENT/PLAN:     There are no diagnoses linked to this encounter.        Follow up: No follow-ups on file.          I personally spent **minutes  on the calendar day of the encounter, including pre and post visit work.     Patient instructions      There are no Patient Instructions on file for this visit.         Signed by Conni Slipper, NP [facility] 4/17/202312:43 PM

## 2022-04-14 ENCOUNTER — Ambulatory Visit: Payer: Medicaid Other | Admitting: Family

## 2022-04-14 LAB — BLOOD CULTURE

## 2022-04-15 ENCOUNTER — Encounter: Payer: Self-pay | Admitting: Physical Medicine and Rehabilitation

## 2022-04-15 ENCOUNTER — Encounter: Payer: Self-pay | Admitting: Internal Medicine

## 2022-04-15 ENCOUNTER — Ambulatory Visit
Payer: Medicaid Other | Attending: Physical Medicine and Rehabilitation | Admitting: Physical Medicine and Rehabilitation

## 2022-04-15 ENCOUNTER — Other Ambulatory Visit: Payer: Self-pay

## 2022-04-15 VITALS — BP 108/74 | HR 106 | Temp 96.7°F | Wt 143.0 lb

## 2022-04-15 DIAGNOSIS — M62838 Other muscle spasm: Secondary | ICD-10-CM

## 2022-04-15 LAB — BLOOD CULTURE: Bacterial Blood Culture: 0

## 2022-04-15 MED ORDER — ONABOTULINUMTOXINA 100 UNIT IJ SOLR *I*
100.0000 [IU] | Freq: Once | INTRAMUSCULAR | Status: AC | PRN
Start: 2022-04-15 — End: 2022-04-15
  Administered 2022-04-15: 100 [IU] via INTRAMUSCULAR

## 2022-04-15 NOTE — Procedures (Signed)
PMR Botox Injections    Performed by: Benita Gutter, MD  Authorized by: Benita Gutter, MD    Date/time: 04/15/2022  1:00 PM EDT  Injections:  16109 - Chemodenervation 1 ext 1-4 muscles and 60454 - Chemodenervation 1 ext 1-4 muscles, 1st addtl  Medication:  100 units botulinum toxin type A 100 units; 100 units botulinum toxin type A 100 units; 100 units botulinum toxin type A 100 units; 100 units botulinum toxin type A 100 units; 100 units botulinum toxin type A 100 units; 100 units botulinum toxin type A 100 units   See below     Procedure Details   The risks, benefits, indications, potential complications, and alternatives were explained to the patient and/or guardian who verbalized understanding and informed consent obtained.     The area for injection was identified and a time out called to re-identify.      The correct patient was identified with 2 identifiers The correct procedure, location site(s) and laterality were identified with the patient and/or guardian and correspond to the consent form  The appropriate site was marked and verified.  The patient was placed in the correct position.    After prepping the skin with alcohol overlying the following muscles, botlinum toxin was injected intramuscularly as follows.     Right and left quadriceps (vastus lateralis 75, vastus medialis 75, vastus intermedius 75, rectus femoris 75) 300 + 300 units      Botulinum toxin Lot #: [id number]  Botulinum toxin expiration date: 08/2024  Total botox units injected: 600 units   Total botox units wasted: 0 units     The patient tolerated the procedure without complications.    Patient will follow up in 12 weeks for repeat botox injection.      Benita Gutter, MD

## 2022-04-16 LAB — BLOOD CULTURE: Bacterial Blood Culture: 0

## 2022-04-22 ENCOUNTER — Other Ambulatory Visit: Payer: Self-pay

## 2022-04-22 ENCOUNTER — Ambulatory Visit: Payer: Medicaid Other | Attending: Emergency Medicine | Admitting: Emergency Medicine

## 2022-04-22 ENCOUNTER — Encounter: Payer: Self-pay | Admitting: Emergency Medicine

## 2022-04-22 VITALS — BP 88/52 | HR 103 | Temp 97.7°F | Resp 20

## 2022-04-22 DIAGNOSIS — L723 Sebaceous cyst: Secondary | ICD-10-CM | POA: Insufficient documentation
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
## 2022-04-22 NOTE — Procedures (Signed)
Incision and drainage    Date/Time: 04/22/2022 12:30 PM    Performed by: Marcelle Smiling, PA  Authorized by: Marcelle Smiling, PA    Consent:     Consent obtained:  Verbal    Consent given by:  Patient    Risks discussed:  Bleeding, incomplete drainage and pain  Universal protocol:     Patient identity confirmed:  Verbally with patient  Location:     Type:  Cyst    Size:  2cm    Location: Left side of neck.  Pre-procedure details:     Skin preparation:  Hibiclens  Anesthesia (see MAR for exact dosages):     Anesthesia method:  Local infiltration (lidocaine 1% w/o epi)  Procedure type:     Complexity:  Simple  Procedure details:     Incision types:  Single straight    Drainage:  Purulent and serosanguinous    Drainage amount:  Moderate    Wound treatment:  Wound left open  Post-procedure details:     Patient tolerance of procedure:  Tolerated well, no immediate complications

## 2022-04-22 NOTE — Progress Notes (Signed)
Patient recently notified CM that she would like to start looking for a new PCP office. CM then reached out to patients care team regarding this concern. Patients CCP and PCP provided CM with a list of providers and clinics in network that were accepting new patients. CM then printed off this information and brought it to patients home address. CM and patient both reviewed the list, and patient stated she would notify CM once she scheduled a new patient appointment. Patient then notified CM that she scheduled a new appointment on 8/14 with [facility]. CM explained to patient that [facility] would be able to manage her medications until she is established. Patient also had a question about completing her aid service re-evaluation paperwork. CM discussed this with her CCP and PCP and confirmed that [facility] would still be able to assist with this. CM then attempted to contact patient however, patient did not answer and CM left a voice message. CM will continue to outreach to patient.

## 2022-04-22 NOTE — Patient Instructions (Addendum)
We have drained the cyst today  Now that this is drained this should heal well over the next few days  Apply warm compresses to the area every 2-3 hours  Tylenol as needed for pain  If you notice increased redness or swelling then please follow-up for reevaluation
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
## 2022-04-30 ENCOUNTER — Encounter: Payer: Self-pay | Admitting: Surgery

## 2022-04-30 ENCOUNTER — Ambulatory Visit: Payer: Medicaid Other | Admitting: Internal Medicine

## 2022-04-30 ENCOUNTER — Encounter: Payer: Self-pay | Admitting: Internal Medicine

## 2022-04-30 ENCOUNTER — Other Ambulatory Visit: Payer: Self-pay

## 2022-04-30 VITALS — BP 123/84 | HR 104 | Temp 96.8°F

## 2022-04-30 DIAGNOSIS — N319 Neuromuscular dysfunction of bladder, unspecified: Secondary | ICD-10-CM

## 2022-04-30 DIAGNOSIS — G4700 Insomnia, unspecified: Secondary | ICD-10-CM

## 2022-04-30 DIAGNOSIS — G8254 Quadriplegia, C5-C7 incomplete: Secondary | ICD-10-CM

## 2022-04-30 DIAGNOSIS — A419 Sepsis, unspecified organism: Secondary | ICD-10-CM

## 2022-04-30 DIAGNOSIS — R221 Localized swelling, mass and lump, neck: Secondary | ICD-10-CM

## 2022-04-30 DIAGNOSIS — K592 Neurogenic bowel, not elsewhere classified: Secondary | ICD-10-CM

## 2022-04-30 MED ORDER — POLYETHYLENE GLYCOL 3350 PO POWD *I*
17.0000 g | Freq: Every day | ORAL | 3 refills | Status: DC | PRN
Start: 2022-04-30 — End: 2023-07-28

## 2022-04-30 MED ORDER — ACETAMINOPHEN 500 MG PO TABS *I*
1000.0000 mg | ORAL_TABLET | Freq: Three times a day (TID) | ORAL | 5 refills | Status: DC | PRN
Start: 2022-04-30 — End: 2024-01-11

## 2022-04-30 MED ORDER — MUPIROCIN 2 % EX OINT *I*
TOPICAL_OINTMENT | Freq: Three times a day (TID) | CUTANEOUS | 0 refills | Status: DC
Start: 2022-04-30 — End: 2022-09-20

## 2022-04-30 MED ORDER — MELATONIN 3 MG PO TABS *I*
3.0000 mg | ORAL_TABLET | Freq: Every evening | ORAL | 0 refills | Status: DC | PRN
Start: 2022-04-30 — End: 2024-05-16

## 2022-04-30 MED ORDER — SERTRALINE HCL 50 MG PO TABS *I*
50.0000 mg | ORAL_TABLET | Freq: Every day | ORAL | 3 refills | Status: DC
Start: 2022-04-30 — End: 2022-09-20

## 2022-04-30 MED ORDER — BISACODYL 10 MG RE SUPP *I*
10.0000 mg | RECTAL | 2 refills | Status: DC
Start: 2022-04-30 — End: 2023-07-28

## 2022-04-30 NOTE — Patient Instructions (Addendum)
Plastic surgery  [address]  [address]  [phone]     Wound Clinic  [address]  [address]  [place], [place] [address]  [phone]      [facility] [place] [facility]   [phone]

## 2022-04-30 NOTE — Progress Notes (Signed)
Reason For Visit: The primary encounter diagnosis was Sepsis. Diagnoses of Constipation, chronic and Insomnia, unspecified type were also pertinent to this visit.      HPI:  Cindy Ochoa is 36 y.o. year old female with quadriplegia get C6, muscle spasticity, UTI, sepsis, neurogenic bladder, frequent UTIs    Patient is here today for follow-up  -She needs CD past paperwork completed    -Recent hospitalization for urosepsis  -Treated with IV antibiotics discharged on Keflex  -Patient has a hard time telling if she has fever chills due to autonomic dysfunction.  -She is asking for lab test to be done today  -Patient has a urostomy tube  -She has no follow-up with urology    Constipation  Needs a refill on Colace, senna and MiraLAX    Patient reports wound on her buttocks mother here to confirm.  -She has seen wound care in the past encouraged her to call for follow-up appointment    Cyst on her left side of her neck recently I indeed  -This area has filled up several times requiring antibiotics and I&D    Social : Patient is here with her mother today    Depression  -Needs refill on sertraline  -Patient reports helping her mood  -She uses melatonin for sleep    Patient is transferring care with seeing a new PCP in August    Medications:     Current Outpatient Medications   Medication Sig   . polyethylene glycol (GLYCOLAX) powder Take 17 g by mouth daily as needed  Mix in 8 oz water or juice and drink.   . bisacodyl (DULCOLAX) 10 mg suppository Place 1 suppository (10 mg total) rectally every other day   . sertraline (ZOLOFT) 50 mg tablet Take 1 tablet (50 mg total) by mouth daily   . acetaminophen (MAPAP) 500 mg tablet Take 2 tablets (1,000 mg total) by mouth every 8 hours as needed for Pain or Fever  for pain   . melatonin 3 mg Take 1 tablet (3 mg total) by mouth nightly as needed for Sleep   . mupirocin (BACTROBAN) 2 % ointment Apply topically 3 times daily  for Hair Follicle Inflammation to the following areas: left  side of neck   . glucagon (BAQSIMI) 3 MG/DOSE nasal powder Inhale one dose (3 mg) into one nostril once as needed for low blood sugar. If no response in 15 min, inhale second dose from new device   . Blood Glucose Monitoring Suppl (FREESTYLE LITE) w/Device KIT Use to test blood sugar as directed   . generic DME Dispense: chux pads   ICD-10: N39.41  Duration: Lifetime  Ht Readings from Last 1 Encounters:  04/16/21 : 1.626 m (5\' 4" )   Wt Readings from Last 1 Encounters:  04/16/21 : 61.2 kg (135 lb)   . generic DME Dispense: briefs   ICD-10: N39.41  Duration: Lifetime  Ht Readings from Last 1 Encounters:  04/16/21 : 1.626 m (5\' 4" )   Wt Readings from Last 1 Encounters:  04/16/21 : 61.2 kg (135 lb)   . Non-System Medication Chux pads  Ht: 5\' 5"  Wt: 143lbs  ICD code: N39.41   . Non-System Medication Briefs/pullups, large  Ht: 5\' 5"  Wt: 143lbs  ICD code: N39.41   . Non-System Medication Motorized wheelchair-   Lifetime 99  Quad c-6   . Non-System Medication Nebulizer with tubing and mask  RO 689  Respiratory insufficiency   . Non-System Medication Portable suction  respiratory insufficiency  Tracheotomy   Quadriparesis   . generic DME Suction machine with all related supplies.   . Non-System Medication Large underpads  Ht: 5\' 5"  Wt: 143lbs  ICD code: N39.41   . generic DME 1/2 inch plain packing gauze   . generic DME Dispense: ROHO  Indication: Quadriplegia   ICD-10: R53.2  Duration: Lifetime  Ht Readings from Last 1 Encounters:  03/19/20 : 1.626 m (5\' 4" )   Wt Readings from Last 1 Encounters:  03/19/20 : 63.5 kg (140 lb)   . Non-System Medication Gloves  Ht: 5\' 5"  Wt: 143lbs  ICD code: N39.41   . disposable underpads 30"x36" (CHUX) Use 6 times daily and PRN. Dx N39.42  Incontinence without sensory awareness   . incontinence supply disposable Large pull ups - use up to 5 x daily  Dx N39.46   . disposable gloves 1 box Dynarex PF Vinyl Gloves   . patient lift Use as directed for patient lifting. For lifetime use; ICD 10:  G82.54 Ht: 1.28m Wt: 76.2 kg   . adjustable bath/shower seat with back For lifetime use; ICD 10: G82.54 Ht: 1.42m Wt: 76.2 kg   . Non-System Medication Urostomy drainage bags 2000 cc change as needed. Dx N39.46 and  G82.54   . generic DME Wafers  Dx code: N31.9  Qty: 10  Refills: 6   . generic DME Urostomy Pouches  Dx Codes(s): N31.9  Qty: 20  Refills: 6   . generic DME Leg bag  Dx Code: N31.9  Qty: 4  Refills: 6   . generic DME Night drainage bag  Dx Code: N31.9  Qty: 4  Refills: 6   . generic DME Barrier rings  Dx Code: N31.9  Qty: 15  Refills: 6   . Ostomy Supplies [employer] Urostomy two piece bag and wafer 1 1/4 "H  7/8 "V N31.9 Neuromuscular dysfunction of the bladder   . Ostomy Supplies [facility] Coloplast Urostomy two piece bag and wafer 1 1/4 "H  7/8 "V N31.9 Neuromuscular dysfunction of the bladder. Use as directed.   Sherren Mocha Supplies Pouch MISC Coloplast Urostomy two piece bag and wafer 1 1/4 "H  7/8 "V N31.9 Neuromuscular dysfunction of the bladder Use as directed   . Ostomy Supplies MISC Barrier ring. Use as directed.   Marland Kitchen generic DME Repairs to hospital bed/hoyer lift  ICD 10: G82.54   Ht: 1.9m Wt: 76.2 kg   . disposable gloves 2 boxes Disposable Medium size gloves N39.46   . generic DME Urostomy drainage bags 2000 cc change as needed. Dx N39.46 and  G82.54   . Gauze Pads & Dressings (ABDOMINAL PAD) 8"X10" PADS By 1 each no specified route daily   Cover buttock wounds 2x daily   . Non-System Medication Gel overlay mattress for hospital bed - diagnosis G82.54, L89.93, L89.159   . Non-System Medication Easy Tip Leg Bags 1000mg  - diagnosis G82.53 N39.46   . etonogestrel (IMPLANON, NEXPLANON) 68 MG IMPL Inject 68 mg into the skin once  Placed 10/20/16     No current facility-administered medications for this visit.       Medication list reconciled this visit    Allergies:     Allergies   Allergen Reactions   . Heparin Other (See Comments)     Thrombocytopenia; HIT   . Ibuprofen Swelling     Pt reports  lip swelling after taking ibuprofen.    . Nitrofurantoin Nausea And Vomiting       Social history  Social History     Tobacco Use   . Smoking status: Some Days     Packs/day: 0.25     Years: 3.00     Pack years: 0.75     Types: Cigarettes     Start date: 12/09/2011   . Smokeless tobacco: Former     Quit date: 04/29/2005   . Tobacco comments:     3 cigarettes a day   Vaping Use   . Vaping status: Not on file   Substance Use Topics   . Alcohol use: Not Currently       Review of Systems     10 point review of system is negative unless discussed in subjective     Physical Exam:     Physical Exam           Vitals:    04/30/22 1115   BP: 123/84   Pulse: 104   Temp: 36 C (96.8 F)   TempSrc: Temporal   SpO2: 98%     Wt Readings from Last 3 Encounters:   04/15/22 64.9 kg (143 lb)   04/06/22 64.9 kg (143 lb)   01/21/22 65.8 kg (145 lb)     BP Readings from Last 3 Encounters:   04/30/22 123/84   04/22/22 (!) 88/52   04/15/22 108/74           RESULTS:     Reviewed all pertain lab and xray reports during the visit     ASSESSMENT/PLAN:     Diagnoses and all orders for this visit:    Sepsis/urosepsis  -     CBC and differential; Future  -     Basic metabolic panel; Future  -Follow-up with urology    Constipation, chronic  -     bisacodyl (DULCOLAX) 10 mg suppository; Place 1 suppository (10 mg total) rectally every other day  -     polyethylene glycol (GLYCOLAX) powder; Take 17 g by mouth daily as needed  Mix in 8 oz water or juice and drink.    Insomnia, unspecified type  -     melatonin 3 mg; Take 1 tablet (3 mg total) by mouth nightly as needed for Sleep      Depression  -     sertraline (ZOLOFT) 50 mg tablet; Take 1 tablet (50 mg total) by mouth daily    Cyst  -Area on neck is resolved after I&D  -May need sac removed to prevent further issue  -     mupirocin (BACTROBAN) 2 % ointment; Apply topically 3 times daily  for Hair Follicle Inflammation to the following areas: left side of neck    Patient reports sacral wound  should call wound care    We will complete [facility] -paperwork     Refills given until she meets new pcp        I personally spent 35 minutes  on the calendar day of the encounter, including pre and post visit work.          Patient instructions      Patient Instructions   Plastic surgery  [address]  [address]  [phone]     Wound Clinic  [address]  [address]  [place], [place] [address]  [phone]      [facility]   [phone]           Signed by Genia Hotter, NP [facility]5/4/20231:02 PM

## 2022-05-04 ENCOUNTER — Ambulatory Visit: Payer: Medicaid Other | Admitting: Obstetrics and Gynecology

## 2022-05-04 ENCOUNTER — Telehealth: Payer: Self-pay

## 2022-05-04 ENCOUNTER — Ambulatory Visit: Payer: Medicaid Other

## 2022-05-04 NOTE — Telephone Encounter (Signed)
Patient missed an appt on 5/8/3, patient was sent a no show #1.

## 2022-05-05 ENCOUNTER — Encounter: Payer: Self-pay | Admitting: Gastroenterology

## 2022-05-05 ENCOUNTER — Other Ambulatory Visit
Admission: RE | Admit: 2022-05-05 | Discharge: 2022-05-05 | Disposition: A | Discharge status: Home / Self Care | Payer: Medicaid Other | Source: Ambulatory Visit | Attending: Internal Medicine | Admitting: Internal Medicine

## 2022-05-05 DIAGNOSIS — A419 Sepsis, unspecified organism: Secondary | ICD-10-CM | POA: Insufficient documentation

## 2022-05-05 LAB — BASIC METABOLIC PANEL
Anion Gap: 14 (ref 7–16)
CO2: 22 mmol/L (ref 20–28)
Calcium: 9.7 mg/dL (ref 8.8–10.2)
Chloride: 105 mmol/L (ref 96–108)
Creatinine: 0.43 mg/dL — ABNORMAL LOW (ref 0.51–0.95)
Glucose: 113 mg/dL — ABNORMAL HIGH (ref 60–99)
Lab: 13 mg/dL (ref 6–20)
Potassium: 4 mmol/L (ref 3.3–5.1)
Sodium: 141 mmol/L (ref 133–145)
eGFR BY CREAT: 129 *

## 2022-05-05 LAB — CBC AND DIFFERENTIAL
Baso # K/uL: 0 10*3/uL (ref 0.0–0.1)
Basophil %: 0.2 %
Eos # K/uL: 0.1 10*3/uL (ref 0.0–0.4)
Eosinophil %: 0.9 %
Hematocrit: 41 % (ref 34–45)
Hemoglobin: 12.9 g/dL (ref 11.2–15.7)
IMM Granulocytes #: 0 10*3/uL (ref 0.0–0.0)
IMM Granulocytes: 0 %
Lymph # K/uL: 2.5 10*3/uL (ref 1.2–3.7)
Lymphocyte %: 42.5 %
MCH: 29 pg (ref 26–32)
MCHC: 32 g/dL (ref 32–36)
MCV: 93 fL (ref 79–95)
Mono # K/uL: 0.3 10*3/uL (ref 0.2–0.9)
Monocyte %: 5.5 %
Neut # K/uL: 3 10*3/uL (ref 1.6–6.1)
Nucl RBC # K/uL: 0 10*3/uL (ref 0.0–0.0)
Nucl RBC %: 0 /100 WBC (ref 0.0–0.2)
Platelets: 98 10*3/uL — ABNORMAL LOW (ref 160–370)
RBC: 4.4 MIL/uL (ref 3.9–5.2)
RDW: 15.1 % — ABNORMAL HIGH (ref 11.7–14.4)
Seg Neut %: 50.9 %
WBC: 5.9 10*3/uL (ref 4.0–10.0)

## 2022-05-06 ENCOUNTER — Telehealth: Payer: Self-pay | Admitting: Internal Medicine

## 2022-05-06 ENCOUNTER — Other Ambulatory Visit: Payer: Self-pay

## 2022-05-06 ENCOUNTER — Telehealth: Payer: Self-pay | Admitting: Urology

## 2022-05-06 ENCOUNTER — Encounter: Payer: Self-pay | Admitting: Gastroenterology

## 2022-05-06 NOTE — Telephone Encounter (Signed)
Copied from CRM #[id number]. Topic: Appointments - Appointment Information  >> May 06, 2022  2:18 PM Naoma Diener wrote:  Patient is calling to see if an FUV/ED appointment had been made for her with Dr. Broadus John for UTI/Sepsis. Earna Coder a Care Coordinator at [facility] called today to try and schedule for the patient. Patient states she was not aware of him calling. Writer unable to schedule due to timeframe.  Please contact her to schedule at [phone] (home)

## 2022-05-06 NOTE — Telephone Encounter (Signed)
Writer re faxed corrected paperwork to [facility] at fax [phone] confirmation recived.

## 2022-05-06 NOTE — Telephone Encounter (Signed)
Copied from CRM #[id number]. Topic: Access to Care - Speak to Provider/Office Staff  >> May 06, 2022  1:25 PM Elnita Maxwell wrote:  Cindy Ochoa received [facility] [phone] for recertification.    It is not compliant:   section 1 patient information not checked yes or no  Date of exam is blank, and treatments or therapy is blank.    [name] is faxing the form back to [facility] today 05/06/2022.    [facility] can be reached at [phone], if needed.

## 2022-05-06 NOTE — Telephone Encounter (Unsigned)
Copied from CRM [id number]. Topic: Appointments - Schedule Appointment  >> May 06, 2022  1:55 PM Elwyn Lade wrote:  Earna Coder a Care Coordinator at [facility] is calling to schedule an appointment.    Appointment Type: FUV   Provider: Dr. Broadus John , MD  What does patient need to be seen for?: Sepsis Management  Preferred location?: Yes [facility]  Type of insurance: [employer]      Is there a referral on file?: N/A  Were the demographics and insurance verified?: Yes  Was the patient connected to [facility]IM if applicable?: No  Was the appropriate expectation set with the patient for a time frame to receive a return call from the office? Yes    Patient can be reached at: [phone]

## 2022-05-06 NOTE — Progress Notes (Signed)
After patients recent [facility] appointment, patients provider wanted patient to follow up with Urology and Plastic Surgery. CM then contacted Urology department to request a follow up appointment. Urology stated they were scheduling far out, and they will have someone reach out to patient once they find a sooner appointment. CM then contacted patient and updated her on this information. CM also provided the phone number for Plastic Surgery to patient so she could schedule an appointment. Patient will notify CM with any other concerns.

## 2022-05-11 ENCOUNTER — Other Ambulatory Visit: Payer: Self-pay

## 2022-05-11 ENCOUNTER — Encounter: Payer: Self-pay | Admitting: Plastic Surgery

## 2022-05-11 ENCOUNTER — Ambulatory Visit: Payer: Medicaid Other | Admitting: Plastic Surgery

## 2022-05-11 DIAGNOSIS — L720 Epidermal cyst: Secondary | ICD-10-CM

## 2022-05-13 NOTE — Progress Notes (Signed)
Established Patient Follow Up Visit      History of Present Illness    Today I had the pleasure of seeing Cindy Ochoa in follow up. She is a 36 y.o. female who presented originally with an inflamed hair follicle of her LEFT neck, I&D performed by Dr. Jeanie Cooks in August 2022. She now returns for repeat evaluation. She reports this area "filled back up" and was swollen, tender, and significantly larger; she sought care at Urgent Care where they incised and drained it in April 2023. She is interested in excision of this area to prevent recurrence. No drainage since the I&D, denies fevers or chills but her dysreflexia can make her sweat.     Physical Exam  Vital signs: There were no vitals taken for this visit.  General appearance: Alert, well appearing, and in no distress.   Skin: Skin color, texture and turgor are overall normal for age.  Focused detailed exam of LEFT neck: Well healed transverse scar, firm as expected. Small nodularity noted to anterior aspect of incision (3 mm) but no other palpable findings. No drainage noted, no fluctuance. No erythema.             Assessment and Recommendations    Cindy Ochoa is a 36 y.o. female who returns in follow up for a recently inflamed cyst of her LEFT neck. We discussed we prefer to wait a minimum of 3 months prior to excision to maximize the chances this lesion does not recur, and to lower the risk for infection. We discussed the lesion would also need to be palpable in order to fully excise it. As the lesion continues to heal and involute, it is possible it may no longer be palpable and therefore not be amenable to excision.     I explained the procedure would likely not be able to performed while she is seated in her wheelchair. Ideally, we would be able to transfer her out of her chair to a stretcher with side rails for safety and to allow her to be realtively comfortable. I will discuss her case with Dr. Gregery Na, to see if it would be possible to perform  excision if necessary in the [facility]aling Center.    Cindy Argue, NP   Plastic Surgery

## 2022-05-15 ENCOUNTER — Ambulatory Visit: Payer: Medicaid Other | Admitting: Surgery

## 2022-05-15 ENCOUNTER — Other Ambulatory Visit: Payer: Self-pay

## 2022-05-15 VITALS — BP 79/48 | HR 87 | Temp 96.9°F | Resp 18 | Wt 156.0 lb

## 2022-05-15 DIAGNOSIS — L89153 Pressure ulcer of sacral region, stage 3: Secondary | ICD-10-CM

## 2022-05-15 DIAGNOSIS — L89313 Pressure ulcer of right buttock, stage 3: Secondary | ICD-10-CM

## 2022-05-15 NOTE — Patient Instructions (Addendum)
Please follow these instructions about how to care for your wound.        Please purchase Eucerin cream and moisturize all skin daily. You can purchase the [employer] version called Equate.    Wound Location Dressing Orders Frequency of Dressing Change   Location: Right buttock Use moisturizer daily  Daily        Cleanse Wound(s) with:   []  Normal Saline  []  Keep Dry    [x]  Soap & Water with dressing changes, keep dressing dry in between          []   Assistive Device(s):   [x]   Wheelchair   []   Walker   []   Cane   []   Crutches      []   Wedge Shoe   []   Other:     []   Pressure Reduction:   []   Wheelchair Cushion   []   Mattress Overlay      []   Specialty Bed   [x]   Reposition every 2 hours         []   Limit time up in your chair to 2 hours or less at a time   []   Other:    []   Home Health:    []   VNS   []   LTC   []   HCR   []   Other:    []   General:    []   Stop / Decrease Smoking   []   Multivitamin   []   Exercise   []   Other:     Covering provider   []   Rise Mu, NP     []   Discharge from Wound Healing Center      Wound Care Clinic Discharge Instructions    Following the instructions below will give you the best opportunity for wound healing.    Wound Care Instructions (If changing own dressing):   Gather all supplies you will need to change your dressing.   Before changing your dressing, wash your hands for at least 15 seconds with warm soapy water.   Rinse off all soap, then dry with a towel.   Remove the old dressing.  Wash hands again before applying the new dressing.    If you experience any of the following during our business hours of 8 AM - 4:30 PM, Monday - Friday, please call the Wound Healing Center at [phone]:    ? Increase in pain  ?Temperature over 101F   ?Drainage with a foul odor   ?Bleeding   ? Increase in swelling  ? Increase in drainage from your wound  ? Need for compression bandage changes (slippage, breakthrough drainage)    Please contact your primary care physician or proceed to the  nearest emergency room if you experience any of the above after our business hours.     Please note your appointment(s) above - if you are unable to keep, kindly give 24 hours' notice.  Thank You!
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
## 2022-06-03 ENCOUNTER — Other Ambulatory Visit: Payer: Self-pay | Admitting: Internal Medicine

## 2022-06-03 ENCOUNTER — Encounter: Payer: Self-pay | Admitting: Plastic Surgery

## 2022-06-03 DIAGNOSIS — N3941 Urge incontinence: Secondary | ICD-10-CM

## 2022-06-03 MED ORDER — GENERIC DME *A*
5 refills | Status: DC
Start: 2022-06-03 — End: 2023-03-01

## 2022-06-04 ENCOUNTER — Other Ambulatory Visit: Payer: Self-pay

## 2022-06-04 NOTE — Progress Notes (Signed)
Patient recently reached out to CM and notified CM she needed assistance with getting her under pads and incontinence pull-ups. CM then reached out to patients providers and notified them about this concern. Patients provider was able to write a new script for these medical supplies and team PSS sent the scripts to [facility] medical supply store. CM then updated patient on the above information. Patient will notify CM if she has any issues receiving these items.

## 2022-06-05 ENCOUNTER — Encounter: Payer: Self-pay | Admitting: Urology

## 2022-06-11 ENCOUNTER — Other Ambulatory Visit: Payer: Self-pay | Admitting: Urology

## 2022-06-11 DIAGNOSIS — R3989 Other symptoms and signs involving the genitourinary system: Secondary | ICD-10-CM

## 2022-06-12 ENCOUNTER — Telehealth: Payer: Self-pay | Admitting: Urology

## 2022-06-12 ENCOUNTER — Ambulatory Visit: Payer: Medicaid Other | Admitting: Urology

## 2022-06-12 NOTE — Telephone Encounter (Unsigned)
Copied from CRM [id number]. Topic: Appointments - Cancel Appointment  >> Jun 12, 2022  8:22 AM Cecil Cranker wrote:  Cindy Ochoa, Patient,  is calling to cancel the patients  FUV appointment which is currently scheduled for 06/12/22 .    Reason for the cancellation: no aide to assist    Has the appointment been cancelled? yes    Has the appointment been rescheduled? no    Date of new appointment?n/a    .

## 2022-06-17 ENCOUNTER — Telehealth: Payer: Self-pay

## 2022-06-17 NOTE — Telephone Encounter (Signed)
Chief Complaint: Acute Vaginitis    Subjective:      Cindy Ochoa is a 36 y.o. female who called the office with concerns related to abnormal vaginal discharge.     Symptoms have been present for 3 days.     Vaginal symptoms: vaginal symptoms of perineal odor and white discharge     Sexually active: no No LMP recorded. (Menstrual status: Nexplanon). Contraception: Implanon. Ponssible pregnancy: no    Last STI screening in the last 3 months? no Any new partners since last STI screening? n/a Sexually transmitted infection risk: very low risk of STD exposure.     Appointments available in the office this week - do not work with pt's schedule.     Pt opt to go to UC for evaluation and treatment.

## 2022-07-03 ENCOUNTER — Encounter: Payer: Self-pay | Admitting: Urology

## 2022-07-03 ENCOUNTER — Other Ambulatory Visit: Payer: Self-pay

## 2022-07-03 ENCOUNTER — Telehealth: Payer: Self-pay | Admitting: Urology

## 2022-07-03 ENCOUNTER — Telehealth: Payer: Self-pay | Admitting: Internal Medicine

## 2022-07-03 ENCOUNTER — Ambulatory Visit: Payer: Medicaid Other | Admitting: Urology

## 2022-07-03 VITALS — Ht 65.0 in | Wt 156.0 lb

## 2022-07-03 DIAGNOSIS — N390 Urinary tract infection, site not specified: Secondary | ICD-10-CM

## 2022-07-03 DIAGNOSIS — N319 Neuromuscular dysfunction of bladder, unspecified: Secondary | ICD-10-CM

## 2022-07-03 MED ORDER — CRANBERRY 200 MG PO CAPS
1.0000 | ORAL_CAPSULE | Freq: Two times a day (BID) | ORAL | 11 refills | Status: DC
Start: 2022-07-03 — End: 2022-09-20

## 2022-07-03 MED ORDER — D-MANNOSE 350 MG PO CAPS
1.0000 | ORAL_CAPSULE | Freq: Two times a day (BID) | ORAL | 11 refills | Status: DC
Start: 2022-07-03 — End: 2022-07-10

## 2022-07-03 NOTE — Telephone Encounter (Signed)
Copied from CRM [id number]. Topic: Access to Care - Speak to Provider/Office Staff  >> Jul 03, 2022  9:25 AM Cindy Ochoa wrote:  Cindy Ochoa reporting working on re-certification for home care services    Completed the community health assessment.  Decision is pending with [facility] nursing to develop a plan of care.      The decision will be made by July 19, letter will follow in the mail    [facility] customer service can be reached [phone]

## 2022-07-03 NOTE — Telephone Encounter (Signed)
Copied from CRM [id number]. Topic: Medications/Prescriptions - Medication Question/Problem  >> Jul 03, 2022 12:24 PM Sondra Come wrote:    Duwayne Heck [facility] called and stated that the Medication: D-Mannose 350 MG CAPS does not come in the Strength that was sent over and that only available is 2000 MG.     Duwayne Heck stated that an Alternative Medication may need to be sent.     Danielle's contact information is: [phone];    Preferred Pharmacy: [id number] - [address], [place] - [phone] AT [facility] OF [address]    Patient Telephone Information:  Mobile          [phone]

## 2022-07-03 NOTE — Telephone Encounter (Signed)
Message noted.
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
## 2022-07-03 NOTE — Telephone Encounter (Signed)
Patient needs 6 month fuv with [facility], patient would like telehome visit due to transportation around 01/03/2023, can you reach out to patient to schedule.

## 2022-07-07 ENCOUNTER — Telehealth: Payer: Self-pay

## 2022-07-07 NOTE — Telephone Encounter (Signed)
Voicemail message left on office phone stating patient needed to reschedule her injection appt for tomorrow with Dr Nada Maclachlan.  Writer attempted to reach patient.  Left vm asking if she wanted a new appt to please call the office and we will get her rescheduled.

## 2022-07-08 ENCOUNTER — Ambulatory Visit: Payer: Medicaid Other | Admitting: Physical Medicine and Rehabilitation

## 2022-07-09 ENCOUNTER — Encounter: Payer: Self-pay | Admitting: Gastroenterology

## 2022-07-10 ENCOUNTER — Other Ambulatory Visit: Payer: Self-pay

## 2022-07-10 NOTE — Progress Notes (Signed)
Today CM reached out to patient to review current concerns and upcoming appointments. Patient has an upcoming new patient appointment on 8/14 at [facility]. Patient is aware of this appointment and will attend. CM also notified patient that CM will be transferring medical facilities soon and that she will be set up with a new CM. Patient was in agreement and understanding of this. Patient had no other concerns at this time.

## 2022-07-15 NOTE — Telephone Encounter (Signed)
Noted 07/15/2022

## 2022-07-15 NOTE — Telephone Encounter (Signed)
Copied from CRM [id number]. Topic: Access to Care - Speak to Provider/Office Staff  >> Jul 15, 2022  1:02 PM [name] wrote:  [employer] [facility] reporting did  a re-certification assessment home care aid services  for [place]    Aid hours will remain at 126 hours per week. Effective until 07/05/23     Auth # [id number]     Letter will be sent in the mail     Revonda Standard can be reached [phone]

## 2022-07-15 NOTE — Telephone Encounter (Signed)
Called pharmacist. Medication is OTC. She will call patient regarding her options at that store.

## 2022-07-22 ENCOUNTER — Other Ambulatory Visit: Payer: Self-pay

## 2022-07-22 NOTE — Progress Notes (Signed)
Added [facility] program

## 2022-08-10 ENCOUNTER — Other Ambulatory Visit: Payer: Self-pay

## 2022-08-10 ENCOUNTER — Encounter: Payer: Self-pay | Admitting: Family Medicine

## 2022-08-10 ENCOUNTER — Ambulatory Visit: Payer: Medicaid Other | Attending: Family Medicine | Admitting: Family Medicine

## 2022-08-10 ENCOUNTER — Other Ambulatory Visit
Admission: RE | Admit: 2022-08-10 | Discharge: 2022-08-10 | Disposition: A | Discharge status: Home / Self Care | Payer: Medicaid Other | Source: Ambulatory Visit

## 2022-08-10 VITALS — BP 128/70 | HR 68 | Ht 65.0 in

## 2022-08-10 DIAGNOSIS — N390 Urinary tract infection, site not specified: Secondary | ICD-10-CM | POA: Insufficient documentation

## 2022-08-10 DIAGNOSIS — E119 Type 2 diabetes mellitus without complications: Secondary | ICD-10-CM | POA: Insufficient documentation

## 2022-08-10 DIAGNOSIS — N761 Subacute and chronic vaginitis: Secondary | ICD-10-CM | POA: Insufficient documentation

## 2022-08-10 DIAGNOSIS — R61 Generalized hyperhidrosis: Secondary | ICD-10-CM | POA: Insufficient documentation

## 2022-08-10 DIAGNOSIS — Z794 Long term (current) use of insulin: Secondary | ICD-10-CM | POA: Insufficient documentation

## 2022-08-10 DIAGNOSIS — Z87440 Personal history of urinary (tract) infections: Secondary | ICD-10-CM | POA: Insufficient documentation

## 2022-08-10 DIAGNOSIS — N200 Calculus of kidney: Secondary | ICD-10-CM | POA: Insufficient documentation

## 2022-08-10 DIAGNOSIS — L89159 Pressure ulcer of sacral region, unspecified stage: Secondary | ICD-10-CM | POA: Insufficient documentation

## 2022-08-10 LAB — CBC AND DIFFERENTIAL
Baso # K/uL: 0 10*3/uL (ref 0.0–0.1)
Basophil %: 0.2 %
Eos # K/uL: 0.1 10*3/uL (ref 0.0–0.4)
Eosinophil %: 0.7 %
Hematocrit: 46 % — ABNORMAL HIGH (ref 34–45)
Hemoglobin: 14.6 g/dL (ref 11.2–15.7)
IMM Granulocytes #: 0 10*3/uL (ref 0.0–0.0)
IMM Granulocytes: 0.2 %
Lymph # K/uL: 3.8 10*3/uL — ABNORMAL HIGH (ref 1.2–3.7)
Lymphocyte %: 42.7 %
MCH: 29 pg (ref 26–32)
MCHC: 32 g/dL (ref 32–36)
MCV: 93 fL (ref 79–95)
Mono # K/uL: 0.6 10*3/uL (ref 0.2–0.9)
Monocyte %: 6.2 %
Neut # K/uL: 4.4 10*3/uL (ref 1.6–6.1)
Nucl RBC # K/uL: 0 10*3/uL (ref 0.0–0.0)
Nucl RBC %: 0 /100 WBC (ref 0.0–0.2)
Platelets: 105 10*3/uL — ABNORMAL LOW (ref 160–370)
RBC: 5 MIL/uL (ref 3.9–5.2)
RDW: 14.8 % — ABNORMAL HIGH (ref 11.7–14.4)
Seg Neut %: 50 %
WBC: 8.8 10*3/uL (ref 4.0–10.0)

## 2022-08-10 LAB — CRP: CRP: 9 mg/L — ABNORMAL HIGH (ref 0–8)

## 2022-08-10 LAB — POCT HEMOGLOBIN A1C: Hemoglobin A1C,POC: 6.2 % — ABNORMAL HIGH

## 2022-08-10 NOTE — Progress Notes (Signed)
[facility] Outpatient Progress Note:    Subjective:    Cindy Ochoa is a 36 y.o. female presenting for New Patient Visit      HPI:      Cindy Ochoa is a lovely 36 year old female who presents today to establish care with a new primary care team.  She has been seen in the [facility] strong system for 17+ years but is looking for a new primary care team that is closer to her house that provides more continuity.  She presents today accompanied by her mom Cindy Ochoa.    She has noticed for the past week or 2 that she is having more diffuse sweating than is normal for her and she is a little bit concerned that she might have another UTI.  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  When she does have a UTI she will experience a foul smell of urine from her bag, loss of appetite, worsening sweats, body aches so bad that she feels that she just needs to go to the emergency room accompanied with possible fever and fatigue.  She has not noted a recent fever but has had the excessive sweating which is concerning for her.    In addition to this concern she does have a noted pressure ulcer on the right buttock which has been seen and evaluated by her wound care team.  She is concerned that this area might be also contributing to the excessive sweats and could be potentially a cause for infection.  Her mom has been helping in terms of personal care when her aids call out recently and has been applying a cream to the area but she would like to see this evaluated to a possible.  Of note there has been no pus or excessive drainage from the wound recently.    A little over a year ago, the patient was told that she had type 2 diabetes mellitus for the first time in her life.  She was started on insulin.  She tells me that doctors kept going up on her insulin and she kept feeling worse and worse.  She finally stopped everything and asked for a second  opinion.  Her A1c was rechecked once she was off all medication and was down below 7 in the 5 or 6 range, per patient.  She is not sure that she ever really had type 2 diabetes mellitus but certainly does not take any medication at the present time.      ROS: As stated above    Objective:  There were no vitals taken for this visit.    Vitals, Medical/Surgical Histories, and Recent Care Notes reviewed  Gen: well-appearing, well-nourished, quadraparesis/plegia, motoried WC bound with spastic contractures x 4 extremities.    HEENT:  Exotropia, right EOM noted.  MMM.    Cor:  RRR S1SS2  Pulm: Clear to auscultation bilaterally.  Skin: Right ischial decubitous ulcer, wound appears healed, surrounding skin clean, dry with hypopigmented scar noted consistent with wound image from 05/15/22, no signs of hyperemia, skin dehiscence, drainage, or skin breakdown today.    Assessment/Plan:    Problem List Items Addressed This Visit        High    History of recurrent UTIs (Chronic)     Concerning for the pt as she has had worsening diaphoresis recently and is concerned this may be due to underlying infection given recent  hosptialization for urosepsis at the beginning of the summer.  bloodwork ordered to look for leukocytosis or evidence of smoldering infection today.  Urine culture orders standing by urology (pt to have urine culture sent today at lab).          Relevant Orders    CBC and differential (Completed)    C reactive protein (Completed)    RESOLVED: history of kidneys stones (Chronic)       Unprioritized    T2DM, no complications, DIET CONTROLLED ALONE - Primary      HbA1c,POC was last checked on 08/10/2022 and was 6.2% which is at goal of <7.  Unclear as of yet whether the patient truly is a diabetic.  I will have to look at her medical history as she did have an A1c in the past that was as high as 11.  I am concerned that this could have been a lab error.  It is clear that she has made significant changes in terms of  lifestyle and diet consumption and no longer drinking sugar containing soda beverages.  Her A1c is 6.2 today, well below goal for technical diagnostic range of type 2 diabetes mellitus.  I recommend no interventions other than diet and lifestyle modification at this point in time and a recheck of the A1c in approximately 6 months.         Relevant Orders    POCT Hemoglobin A1C       Low    healed right ischial decubitus ulcer   Other Visit Diagnoses     Subacute vaginitis        Relevant Orders    AMB REFERRAL TO [facility]ST[facility] [place] [facility]    Diaphoresis        Relevant Orders    CBC and differential (Completed)    C reactive protein (Completed)                        Follow up: Follow up in about 3 months (around 11/10/2022) for Reassessment.      Cindy Dukes Elzy Tomasello, DO  [facility], Department of [facility] Medicine  [facility]  [address] [place], [place] [address]  Phone: [phone]  Fax: [phone]        To my patients:  Some of my notes are dictated using voice-recognition program which may result in minor transcription errors.  If you have any urgent concerns, please contact me through MyChart.  Please bring any non-urgent concerns to your next appointment so we can discuss them.  Thank you!

## 2022-08-10 NOTE — Assessment & Plan Note (Signed)
HbA1c,POC was last checked on 08/10/2022 and was 6.2% which is at goal of <7.  Unclear as of yet whether the patient truly is a diabetic.  I will have to look at her medical history as she did have an A1c in the past that was as high as 11.  I am concerned that this could have been a lab error.  It is clear that she has made significant changes in terms of lifestyle and diet consumption and no longer drinking sugar containing soda beverages.  Her A1c is 6.2 today, well below goal for technical diagnostic range of type 2 diabetes mellitus.  I recommend no interventions other than diet and lifestyle modification at this point in time and a recheck of the A1c in approximately 6 months.

## 2022-08-10 NOTE — Assessment & Plan Note (Signed)
Concerning for the pt as she has had worsening diaphoresis recently and is concerned this may be due to underlying infection given recent hosptialization for urosepsis at the beginning of the summer.  bloodwork ordered to look for leukocytosis or evidence of smoldering infection today.  Urine culture orders standing by urology (pt to have urine culture sent today at lab).

## 2022-08-10 NOTE — Patient Instructions (Signed)
It was a pleasure meeting you today.  I look forward to working with you in the future.  I have ordered a CBC and ESR.  These are blood test that can be performed at any [facility] outpatient lab.  Nonfasting please.  These will help evaluate you for any underlying infection that could be contributing to your increase in sweating.  Your skin integrity looks excellent I do not believe that the increased reddening is associated with your decubitus ulcer.  Looking forward to seeing you in a couple weeks for reevaluation.    Sincerely yours,  Dr. Morrie Sheldon

## 2022-08-11 ENCOUNTER — Encounter: Payer: Self-pay | Admitting: Family Medicine

## 2022-08-12 ENCOUNTER — Other Ambulatory Visit: Payer: Self-pay | Admitting: Family Medicine

## 2022-08-12 ENCOUNTER — Telehealth: Payer: Self-pay | Admitting: Family Medicine

## 2022-08-12 DIAGNOSIS — N390 Urinary tract infection, site not specified: Secondary | ICD-10-CM

## 2022-08-12 MED ORDER — SULFAMETHOXAZOLE-TRIMETHOPRIM 800-160 MG PO TABS *I*
1.0000 | ORAL_TABLET | Freq: Two times a day (BID) | ORAL | 0 refills | Status: AC
Start: 2022-08-12 — End: 2022-08-17

## 2022-08-12 NOTE — Telephone Encounter (Signed)
Left msg for Cindy Ochoa to call office back

## 2022-08-12 NOTE — Result Encounter Note (Signed)
Please contact patient in the morning and let her know that although initial urine culture results were negative, the specimen has since grown bacteria consistent with an acute urinary tract infection.  A 5-Kary Sugrue course of oral Bactrim has been prescribed and sent to the patient's preferred pharmacy [facility]).  Please contact patient first in the morning to let her know that the medication is available for pickup and ask how she is feeling.  Let her know that if further results show that we need to switch antibiotic, we will be in contact immediately depending upon the susceptibility data from the urine culture.

## 2022-08-12 NOTE — Telephone Encounter (Signed)
-----   Message from [facility] Day, DO sent at 08/12/2022  7:44 AM EDT -----  Results have been received for workup recently undertaken for:     Patient:  Maline Vossen DOB:  08-Jun-1986  The results are normal and no evidence of UTI - suspect excessive sweating may be from autonomic dysfunction - no infectious process identified  Our office will communicate these results to patient via phone call  [name] Day, DO  August 12, 2022 7:44 AM

## 2022-08-13 ENCOUNTER — Telehealth: Payer: Self-pay | Admitting: Family Medicine

## 2022-08-13 NOTE — Telephone Encounter (Signed)
FYI: Pt called back and was given message, no other questions.  She is feeling the same, urine is very strong, and still having sweats.  Pt stated she is quadriplegic, so no pain.     She will have her mom p/u RX.  On to nurse as Lorain Childes.

## 2022-08-13 NOTE — Telephone Encounter (Signed)
Left message to call the office on both numbers.

## 2022-08-13 NOTE — Telephone Encounter (Signed)
-----   Message from [facility] Day, DO sent at 08/12/2022  6:16 PM EDT -----  Please contact patient in the morning and let her know that although initial urine culture results were negative, the specimen has since grown bacteria consistent with an acute urinary tract infection.  A 5-day course of oral Bactrim has been prescribed and sent to the patient's preferred pharmacy [facility]).  Please contact patient first in the morning to let her know that the medication is available for pickup and ask how she is feeling.  Let her know that if further results show that we need to switch antibiotic, we will be in contact immediately depending upon the susceptibility data from the urine culture.

## 2022-08-14 ENCOUNTER — Telehealth: Payer: Self-pay | Admitting: Urology

## 2022-08-14 LAB — AEROBIC CULTURE

## 2022-08-14 NOTE — Telephone Encounter (Signed)
Nursing, please contact patient. She dropped off a urine culture without any other communications from our office. 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 Please find out if she is symptomatic for UTI. If she is feeling well, we do not need to treat Thanks!

## 2022-08-15 ENCOUNTER — Encounter: Payer: Self-pay | Admitting: Urology

## 2022-08-17 ENCOUNTER — Other Ambulatory Visit: Payer: Self-pay | Admitting: Family Medicine

## 2022-08-17 DIAGNOSIS — N390 Urinary tract infection, site not specified: Secondary | ICD-10-CM

## 2022-08-17 MED ORDER — CEPHALEXIN 500 MG PO CAPS *I*
500.0000 mg | ORAL_CAPSULE | Freq: Four times a day (QID) | ORAL | 0 refills | Status: AC
Start: 2022-08-17 — End: 2022-08-24

## 2022-08-17 NOTE — Progress Notes (Signed)
[facility] message sent to patient notifying her cephalexin (KEFLEX) 500 mg capsule medication sent to the pharmacy.

## 2022-08-18 ENCOUNTER — Encounter: Payer: Self-pay | Admitting: Gastroenterology

## 2022-08-18 ENCOUNTER — Telehealth: Payer: Self-pay | Admitting: Family Medicine

## 2022-08-18 ENCOUNTER — Telehealth: Payer: Self-pay

## 2022-08-18 NOTE — Telephone Encounter (Signed)
Dr. Nada Maclachlan-    Patient's mother called seeking an earlier injection appt for her daughter. They missed her July injection due to care help. Patient is having uncontrolled spasms making it difficult to perform daily functions.  Thank you,    Marijean Niemann

## 2022-08-18 NOTE — Telephone Encounter (Signed)
-----   Message from [facility] LPN sent at 7/84/6962 10:05 AM EDT -----    ----- Message -----  From: Hessie Diener, DO  Sent: 08/12/2022   7:44 AM EDT  To: [facility] [place] [facility] Nurse    Results have been received for workup recently undertaken for:     Patient:  Cindy Ochoa DOB:  January 18, 1986  The results are normal and no evidence of UTI - suspect excessive sweating may be from autonomic dysfunction - no infectious process identified  Our office will communicate these results to patient via phone call  [name] Day, DO  August 12, 2022 7:44 AM

## 2022-08-18 NOTE — Telephone Encounter (Signed)
Left msg for [name] twice.     Sending Mychart as well.

## 2022-08-20 ENCOUNTER — Telehealth: Payer: Self-pay

## 2022-08-20 NOTE — Telephone Encounter (Signed)
Left voicemail on mobile number to call office to move appt to earlier time slot per Dr. Nada Maclachlan. Offering to double book any slot tomorrow not already double booked or Tues any INJ slot not already double booked.

## 2022-08-21 ENCOUNTER — Ambulatory Visit
Payer: Medicaid Other | Attending: Physical Medicine and Rehabilitation | Admitting: Physical Medicine and Rehabilitation

## 2022-08-21 ENCOUNTER — Encounter: Payer: Self-pay | Admitting: Physical Medicine and Rehabilitation

## 2022-08-21 ENCOUNTER — Other Ambulatory Visit: Payer: Self-pay

## 2022-08-21 VITALS — HR 80 | Temp 97.2°F | Wt 156.0 lb

## 2022-08-21 DIAGNOSIS — M62838 Other muscle spasm: Secondary | ICD-10-CM | POA: Insufficient documentation

## 2022-08-21 MED ORDER — ONABOTULINUMTOXINA 100 UNIT IJ SOLR *I*
100.0000 [IU] | Freq: Once | INTRAMUSCULAR | Status: AC | PRN
Start: 2022-08-21 — End: 2022-08-21
  Administered 2022-08-21: 100 [IU] via INTRAMUSCULAR

## 2022-08-21 NOTE — Procedures (Signed)
PMR Botox Injections    Performed by: Benita Gutter, MD  Authorized by: Benita Gutter, MD    Date/time: 08/21/2022 11:30 AM EDT  Injections:  [address] - Chemodenervation 1 ext 1-4 muscles and [address] - Chemodenervation 1 ext 1-4 muscles, 1st addtl  Medication:  100 units botulinum toxin type A 100 units; 100 units botulinum toxin type A 100 units; 100 units botulinum toxin type A 100 units; 100 units botulinum toxin type A 100 units; 100 units botulinum toxin type A 100 units; 100 units botulinum toxin type A 100 units   See below     Procedure Details   The risks, benefits, indications, potential complications, and alternatives were explained to the patient and/or guardian who verbalized understanding and informed consent obtained.   ?  The area for injection was identified and a time out called to re-identify.    ?  The correct patient was identified with 2 identifiers The correct procedure, location site(s) and laterality were identified with the patient and/or guardian and correspond to the consent form  The appropriate site was marked and verified.  The patient was placed in the correct position.  ?  After prepping the skin with alcohol overlying the following muscles, botlinum toxin was injected intramuscularly as follows.   ??  Right and left quadriceps (vastus lateralis 75, vastus medialis 75, vastus intermedius 75, rectus femoris 75) 300 + 300 units    ?  Botulinum toxin Lot #: [id number]  Botulinum toxin expiration date: 09/2024  Total botox units injected: 600 units   Total botox units wasted: 0 units   ??  The patient tolerated the procedure without complications.  ??  Patient will follow up in 12 weeks for repeat botox injection.      Benita Gutter, MD

## 2022-09-15 ENCOUNTER — Ambulatory Visit: Payer: Medicaid Other | Admitting: Optometry

## 2022-09-17 ENCOUNTER — Encounter: Payer: Self-pay | Admitting: Family Medicine

## 2022-09-18 ENCOUNTER — Inpatient Hospital Stay
Admission: EM | Admit: 2022-09-18 | Discharge: 2022-09-20 | DRG: 720 | Disposition: A | Discharge status: Home / Self Care | Payer: Medicaid Other | Source: Ambulatory Visit | Attending: Internal Medicine | Admitting: Internal Medicine

## 2022-09-18 ENCOUNTER — Encounter: Payer: Self-pay | Admitting: Gastroenterology

## 2022-09-18 ENCOUNTER — Other Ambulatory Visit
Admission: RE | Admit: 2022-09-18 | Discharge: 2022-09-18 | Disposition: A | Discharge status: Home / Self Care | Payer: Medicaid Other | Source: Ambulatory Visit | Attending: Emergency Medicine | Admitting: Emergency Medicine

## 2022-09-18 ENCOUNTER — Emergency Department: Payer: Medicaid Other

## 2022-09-18 ENCOUNTER — Other Ambulatory Visit: Payer: Self-pay

## 2022-09-18 ENCOUNTER — Encounter: Payer: Self-pay | Admitting: Internal Medicine

## 2022-09-18 DIAGNOSIS — R0981 Nasal congestion: Secondary | ICD-10-CM

## 2022-09-18 DIAGNOSIS — J189 Pneumonia, unspecified organism: Secondary | ICD-10-CM | POA: Diagnosis present

## 2022-09-18 DIAGNOSIS — I517 Cardiomegaly: Secondary | ICD-10-CM

## 2022-09-18 DIAGNOSIS — J069 Acute upper respiratory infection, unspecified: Secondary | ICD-10-CM | POA: Diagnosis present

## 2022-09-18 DIAGNOSIS — R112 Nausea with vomiting, unspecified: Secondary | ICD-10-CM

## 2022-09-18 DIAGNOSIS — K5909 Other constipation: Secondary | ICD-10-CM | POA: Diagnosis present

## 2022-09-18 DIAGNOSIS — E119 Type 2 diabetes mellitus without complications: Secondary | ICD-10-CM | POA: Diagnosis present

## 2022-09-18 DIAGNOSIS — R000 Tachycardia, unspecified: Secondary | ICD-10-CM

## 2022-09-18 DIAGNOSIS — Z20822 Contact with and (suspected) exposure to covid-19: Secondary | ICD-10-CM | POA: Diagnosis present

## 2022-09-18 DIAGNOSIS — G8250 Quadriplegia, unspecified: Secondary | ICD-10-CM | POA: Diagnosis present

## 2022-09-18 DIAGNOSIS — L89159 Pressure ulcer of sacral region, unspecified stage: Secondary | ICD-10-CM | POA: Diagnosis present

## 2022-09-18 DIAGNOSIS — A419 Sepsis, unspecified organism: Principal | ICD-10-CM | POA: Diagnosis present

## 2022-09-18 DIAGNOSIS — K5900 Constipation, unspecified: Secondary | ICD-10-CM

## 2022-09-18 DIAGNOSIS — R0989 Other specified symptoms and signs involving the circulatory and respiratory systems: Secondary | ICD-10-CM

## 2022-09-18 HISTORY — DX: Pneumonia, unspecified organism: J18.9

## 2022-09-18 LAB — CBC AND DIFFERENTIAL
Baso # K/uL: 0 10*3/uL (ref 0.0–0.2)
Basophil %: 0.2 %
Eos # K/uL: 0 10*3/uL (ref 0.0–0.5)
Eosinophil %: 0.1 %
Hematocrit: 44 % (ref 34–49)
Hemoglobin: 14.5 g/dL (ref 11.2–16.0)
IMM Granulocytes #: 0.1 10*3/uL — ABNORMAL HIGH (ref 0.0–0.0)
IMM Granulocytes: 0.5 %
Lymph # K/uL: 1.1 10*3/uL (ref 1.0–5.0)
Lymphocyte %: 7.7 %
MCH: 31 pg (ref 26–32)
MCHC: 33 g/dL (ref 32–36)
MCV: 93 fL (ref 75–100)
Mono # K/uL: 0.4 10*3/uL (ref 0.1–1.0)
Monocyte %: 2.9 %
Neut # K/uL: 12.9 10*3/uL — ABNORMAL HIGH (ref 1.5–6.5)
Nucl RBC # K/uL: 0 10*3/uL (ref 0.0–0.0)
Nucl RBC %: 0 /100 WBC (ref 0.0–0.2)
Platelets: 92 10*3/uL — ABNORMAL LOW (ref 150–450)
RBC: 4.7 MIL/uL (ref 4.0–5.5)
RDW: 15.4 % — ABNORMAL HIGH (ref 0.0–15.0)
Seg Neut %: 88.6 %
WBC: 14.6 10*3/uL — ABNORMAL HIGH (ref 3.5–11.0)

## 2022-09-18 LAB — URINALYSIS WITH MICROSCOPIC
Glucose,UA: NEGATIVE
Ketones, UA: NEGATIVE
Nitrite,UA: NEGATIVE
Specific Gravity,UA: 1.01 (ref 1.002–1.030)
WBC,UA: >50 /hpf — AB (ref 0–5)
pH,UA: 7.5 (ref 5.0–8.0)

## 2022-09-18 LAB — HOLD GRAY

## 2022-09-18 LAB — BASIC METABOLIC PANEL
Calcium: 9.4 mg/dL (ref 8.8–10.2)
Chloride: 105 mmol/L (ref 96–108)
Creatinine: 0.41 mg/dL — ABNORMAL LOW (ref 0.51–0.95)
Glucose: 158 mg/dL — ABNORMAL HIGH (ref 60–99)
Lab: 16 mg/dL (ref 6–20)
Sodium: 138 mmol/L (ref 133–145)
eGFR BY CREAT: 130 *

## 2022-09-18 LAB — LACTATE, PLASMA: Lactate: 3.2 mmol/L — ABNORMAL HIGH (ref 0.5–2.2)

## 2022-09-18 LAB — HOLD GREEN NO GEL

## 2022-09-18 LAB — COVID/INFLUENZA A & B/RSV NAAT (PCR)
COVID-19 NAAT (PCR): NEGATIVE
Influenza A NAAT (PCR): NEGATIVE
Influenza B NAAT (PCR): NEGATIVE
RSV NAAT (PCR): NEGATIVE

## 2022-09-18 LAB — HOLD BLUE

## 2022-09-18 LAB — HOLD LAVENDER

## 2022-09-18 LAB — BLOOD BANK HOLD PINK

## 2022-09-18 LAB — PREGNANCY TEST, SERUM: Preg,Serum: NEGATIVE

## 2022-09-18 LAB — PERFORMING LAB

## 2022-09-18 MED ORDER — DOXYCYCLINE 100 MG / 110 ML NS *I*
100.0000 mg | Freq: Two times a day (BID) | INTRAVENOUS | Status: DC
Start: 2022-09-18 — End: 2022-09-19
  Administered 2022-09-18: 100 mg via INTRAVENOUS
  Filled 2022-09-18 (×3): qty 100

## 2022-09-18 MED ORDER — CEFTRIAXONE SODIUM 1 G IN STERILE WATER 10ML SYRINGE *I*
1000.0000 mg | INTRAVENOUS | Status: DC
Start: 2022-09-19 — End: 2022-09-19
  Filled 2022-09-18: qty 10

## 2022-09-18 MED ORDER — GUAIFENESIN 600 MG PO TB12 *I*
600.0000 mg | ORAL_TABLET | Freq: Two times a day (BID) | ORAL | Status: DC
Start: 2022-09-18 — End: 2022-09-20
  Administered 2022-09-18 – 2022-09-20 (×4): 600 mg via ORAL
  Filled 2022-09-18 (×4): qty 1

## 2022-09-18 MED ORDER — BISACODYL 10 MG RE SUPP *I*
10.0000 mg | Freq: Every day | RECTAL | Status: DC
Start: 2022-09-18 — End: 2022-09-20
  Administered 2022-09-18: 10 mg via RECTAL

## 2022-09-18 MED ORDER — SODIUM CHLORIDE 0.9 % IV BOLUS *I*
1000.0000 mL | Freq: Once | Status: AC
Start: 2022-09-18 — End: 2022-09-18
  Administered 2022-09-18: 1000 mL via INTRAVENOUS

## 2022-09-18 MED ORDER — SODIUM CHLORIDE 0.9 % FLUSH FOR PUMPS *I*
0.0000 mL/h | INTRAVENOUS | Status: DC | PRN
Start: 2022-09-18 — End: 2022-09-20

## 2022-09-18 MED ORDER — DEXTROSE 5 % FLUSH FOR PUMPS *I*
0.0000 mL/h | INTRAVENOUS | Status: DC | PRN
Start: 2022-09-18 — End: 2022-09-20

## 2022-09-18 MED ORDER — LACTATED RINGERS IV BOLUS *I*
1000.0000 mL | Freq: Once | INTRAVENOUS | Status: AC
Start: 2022-09-18 — End: 2022-09-18
  Administered 2022-09-18: 1000 mL via INTRAVENOUS

## 2022-09-18 MED ORDER — BISACODYL 10 MG RE SUPP *I*
10.0000 mg | Freq: Every day | RECTAL | Status: DC | PRN
Start: 2022-09-18 — End: 2022-09-18

## 2022-09-18 MED ORDER — CEFTRIAXONE SODIUM 1 G IN STERILE WATER 10ML SYRINGE *I*
1000.0000 mg | Freq: Once | INTRAVENOUS | Status: AC
Start: 2022-09-18 — End: 2022-09-18
  Administered 2022-09-18: 1000 mg via INTRAVENOUS
  Filled 2022-09-18: qty 10

## 2022-09-18 MED ORDER — DOXYCYCLINE 100 MG / 110 ML NS *I*
100.0000 mg | Freq: Once | INTRAVENOUS | Status: AC
Start: 2022-09-18 — End: 2022-09-18
  Administered 2022-09-18: 100 mg via INTRAVENOUS
  Filled 2022-09-18: qty 100

## 2022-09-18 MED ORDER — SODIUM CHLORIDE 0.9 % IN NEBU *I*
3.0000 mL | INHALATION_SOLUTION | Freq: Two times a day (BID) | RESPIRATORY_TRACT | Status: DC
Start: 2022-09-18 — End: 2022-09-20
  Administered 2022-09-18 – 2022-09-20 (×4): 3 mL via RESPIRATORY_TRACT
  Filled 2022-09-18 (×3): qty 3

## 2022-09-18 NOTE — Consults (Signed)
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 Discussed wound status with pt--pt had photo from recent wound care at home and indicated wound no longer had depth. Had been utilizing hydrofiber laid atop wound bed, then covered with foam dressing changed daily (pt showers daily).     Pt agreeable to utilizing foam dressing to site changed daily, as it sounds like hydrofiber is not indicated if no depth appreciated. Notified covering provider of pt declination for assessment, but agreeable to dressing based on recent description of wound bed.    Plan  Turn and reposition every 2 hours & PRN  LAL mattress when no longer in hallway  Offloading: heel "floating" with pillows  Topical recommendations  R ischium - Cleanse with normal saline, gently dry. Apply 3x3 mepilex border foam dressing. Change daily & PRN    WOCN will keep pt on consult list and assess on Monday 09/21/22 when pt in room.  Consuela Mimes  BSN RN CWON  Available for communication via Careers information officer
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
## 2022-09-18 NOTE — ED Notes (Signed)
Report Given To  [facility] RN       Descriptive Sentence / Reason for Admission   Presents with chest congestion, N/V starting this morning. Denies pain Pts son has been sick for the past 3 days. Arrives in own motorized wheelchair. Hx Quadriparesis, DM.        Prehospital medications given: No            Active Issues / Relevant Events   Full Code  A&O  Wheelchair bound-Quadriplegia  RA  Old trach site  Ostomy  Regular Diet      To Do List        Anticipatory Guidance / Discharge Planning

## 2022-09-18 NOTE — ED Notes (Signed)
MD and Charge RN notified due to continued concerns for this patient status.

## 2022-09-18 NOTE — Plan of Care (Signed)
Problem: Safety  Goal: Patient will remain free of falls  Outcome: Maintaining  Goal: Prevent any intentional injury  Outcome: Maintaining     Problem: Pain/Comfort  Goal: Patient's pain or discomfort is manageable  Outcome: Maintaining     Problem: Nutrition  Goal: Patient's nutritional status is maintained or improved  Outcome: Maintaining     Problem: Psychosocial  Goal: Demonstrates ability to cope with illness  Outcome: Maintaining
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
## 2022-09-18 NOTE — ED Notes (Signed)
IV attempted unscessfully, MD paged to inform

## 2022-09-18 NOTE — ED Triage Notes (Signed)
Presents with chest congestion, N/V starting this morning. Denies pain Pts son has been sick for the past 3 days. Arrives in own motorized wheelchair. Hx Quadriparesis, DM.       Prehospital medications given: No

## 2022-09-19 ENCOUNTER — Inpatient Hospital Stay: Payer: Medicaid Other

## 2022-09-19 LAB — COMPREHENSIVE METABOLIC PANEL
Albumin: 4 g/dL (ref 3.5–5.2)
Anion Gap: 12 (ref 7–16)
Bilirubin,Total: 0.7 mg/dL (ref 0.0–1.2)
CO2: 20 mmol/L (ref 20–28)
Calcium: 9 mg/dL (ref 8.8–10.2)
Chloride: 107 mmol/L (ref 96–108)
Creatinine: 0.38 mg/dL — ABNORMAL LOW (ref 0.51–0.95)
Glucose: 120 mg/dL — ABNORMAL HIGH (ref 60–99)
Lab: 11 mg/dL (ref 6–20)
Sodium: 139 mmol/L (ref 133–145)
Total Protein: 7.6 g/dL (ref 6.3–7.7)
eGFR BY CREAT: 132 *

## 2022-09-19 LAB — CBC
Hematocrit: 37 % (ref 34–49)
Hemoglobin: 12.3 g/dL (ref 11.2–16.0)
MCH: 31 pg (ref 26–32)
MCHC: 33 g/dL (ref 32–36)
MCV: 92 fL (ref 75–100)
Platelets: 69 10*3/uL — ABNORMAL LOW (ref 150–450)
RBC: 4 MIL/uL (ref 4.0–5.5)
RDW: 15.1 % — ABNORMAL HIGH (ref 0.0–15.0)
WBC: 8 10*3/uL (ref 3.5–11.0)

## 2022-09-19 LAB — HOLD SST

## 2022-09-19 MED ORDER — ACETAMINOPHEN 500 MG PO TABS *I*
1000.0000 mg | ORAL_TABLET | Freq: Three times a day (TID) | ORAL | Status: DC
Start: 2022-09-19 — End: 2022-09-19

## 2022-09-19 MED ORDER — AMOXICILLIN 500 MG PO CAPS *I*
1000.0000 mg | ORAL_CAPSULE | Freq: Three times a day (TID) | ORAL | Status: DC
Start: 2022-09-19 — End: 2022-09-20
  Administered 2022-09-19 – 2022-09-20 (×4): 1000 mg via ORAL
  Filled 2022-09-19 (×6): qty 2

## 2022-09-19 MED ORDER — DOXYCYCLINE HYCLATE 100 MG PO TABS *I*
100.0000 mg | ORAL_TABLET | Freq: Two times a day (BID) | ORAL | Status: DC
Start: 2022-09-19 — End: 2022-09-20
  Administered 2022-09-19 – 2022-09-20 (×3): 100 mg via ORAL
  Filled 2022-09-19 (×4): qty 1

## 2022-09-19 MED ORDER — POLYETHYLENE GLYCOL 3350 PO PACK 17 GM *I*
17.0000 g | PACK | Freq: Every day | ORAL | Status: DC
Start: 2022-09-19 — End: 2022-09-20
  Filled 2022-09-19: qty 17

## 2022-09-19 MED ORDER — SENNOSIDES 8.6 MG PO TABS *I*
1.0000 | ORAL_TABLET | Freq: Every day | ORAL | Status: DC
Start: 2022-09-19 — End: 2022-09-20
  Filled 2022-09-19: qty 1

## 2022-09-19 MED ORDER — MELATONIN 3 MG PO TABS *I*
6.0000 mg | ORAL_TABLET | Freq: Every evening | ORAL | Status: DC | PRN
Start: 2022-09-19 — End: 2022-09-20

## 2022-09-19 MED ORDER — ACETAMINOPHEN 500 MG PO TABS *I*
1000.0000 mg | ORAL_TABLET | Freq: Three times a day (TID) | ORAL | Status: DC | PRN
Start: 2022-09-19 — End: 2022-09-20
  Administered 2022-09-19: 1000 mg via ORAL
  Filled 2022-09-19: qty 2

## 2022-09-19 NOTE — Plan of Care (Signed)
Problem: Safety  Goal: Patient will remain free of falls  Outcome: Maintaining     Problem: Pain/Comfort  Goal: Patient's pain or discomfort is manageable  Outcome: Maintaining     Problem: Mobility  Goal: Patient's functional status is maintained or improved  Outcome: Maintaining

## 2022-09-19 NOTE — Progress Notes (Signed)
Pt admitted to unit in stable condition. Pt alert and oriented x 4 and able to make her needs known. VS taken and recorded. Pt denies any pain or discomfort at this time. Pt oriented to unit; bed in lowest position and call bell within reach.     Four eyed skin assessment done with RN Rogelio Seen revealed healed pressure injuries on sacral area and bruises to bilateral knees, otherwise skin is intact.

## 2022-09-19 NOTE — Procedures (Signed)
Ultrasound Guided Peripheral IV    Indication: Difficult access requires blood culture & labs  Ordering Provider: Not Applicable    Procedure Details:  Patient verification with 2 identifiers: Yes  Site: LFA  Site marked: No  Vessel depth: 0.25  Vessel diameter: 0.24    Analgesia: Other developmentally appropriate comfort measures    Site prepared with 2% Chlorhexidine & ETOH. Using no touch technique vessel cannulated with a 22g x 1.0 gauge intercath under ultrasound guidance. Evidence of brisk blood return, BLOOD CULTURES & LABS drawn = 25ml  and ease of flush noted. Line secured.    Findings:  Patient did tolerate procedure well.  Number of Attempts:1  Complications: No    Education provided to patient and bedside nurse: Yes    Performed by: Sharon Mt, RN
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
## 2022-09-20 ENCOUNTER — Other Ambulatory Visit: Payer: Self-pay

## 2022-09-20 DIAGNOSIS — J189 Pneumonia, unspecified organism: Secondary | ICD-10-CM

## 2022-09-20 DIAGNOSIS — G8254 Quadriplegia, C5-C7 incomplete: Secondary | ICD-10-CM

## 2022-09-20 DIAGNOSIS — K5909 Other constipation: Secondary | ICD-10-CM

## 2022-09-20 DIAGNOSIS — R6520 Severe sepsis without septic shock: Secondary | ICD-10-CM

## 2022-09-20 DIAGNOSIS — A419 Sepsis, unspecified organism: Principal | ICD-10-CM

## 2022-09-20 LAB — CBC
Hematocrit: 37 % (ref 34–49)
Hemoglobin: 12.6 g/dL (ref 11.2–16.0)
MCH: 31 pg (ref 26–32)
MCHC: 34 g/dL (ref 32–36)
MCV: 92 fL (ref 75–100)
Platelets: 72 10*3/uL — ABNORMAL LOW (ref 150–450)
RBC: 4.1 MIL/uL (ref 4.0–5.5)
RDW: 14.8 % (ref 0.0–15.0)
WBC: 5.9 10*3/uL (ref 3.5–11.0)

## 2022-09-20 LAB — BASIC METABOLIC PANEL
Anion Gap: 11 (ref 7–16)
CO2: 21 mmol/L (ref 20–28)
Calcium: 9.1 mg/dL (ref 8.8–10.2)
Chloride: 109 mmol/L — ABNORMAL HIGH (ref 96–108)
Creatinine: 0.38 mg/dL — ABNORMAL LOW (ref 0.51–0.95)
Glucose: 108 mg/dL — ABNORMAL HIGH (ref 60–99)
Lab: 11 mg/dL (ref 6–20)
Potassium: 3.6 mmol/L (ref 3.3–5.1)
Sodium: 141 mmol/L (ref 133–145)
eGFR BY CREAT: 132 *

## 2022-09-20 MED ORDER — AMOXICILLIN 500 MG PO CAPS *I*
1000.0000 mg | ORAL_CAPSULE | Freq: Three times a day (TID) | ORAL | 0 refills | Status: DC
Start: 2022-09-20 — End: 2022-09-20
  Filled 2022-09-20: qty 7, 1d supply, fill #0

## 2022-09-20 MED ORDER — SENNOSIDES 8.6 MG PO TABS *I*
1.0000 | ORAL_TABLET | Freq: Every day | ORAL | 0 refills | Status: AC
Start: 2022-09-21 — End: 2022-10-01
  Filled 2022-09-20: qty 10, 10d supply, fill #0

## 2022-09-20 MED ORDER — AMOXICILLIN 500 MG PO CAPS *I*
1000.0000 mg | ORAL_CAPSULE | Freq: Three times a day (TID) | ORAL | 0 refills | Status: AC
Start: 2022-09-20 — End: 2022-09-23
  Filled 2022-09-20: qty 14, 3d supply, fill #0

## 2022-09-20 MED ORDER — SODIUM CHLORIDE 0.9 % IN NEBU *I*
3.0000 mL | INHALATION_SOLUTION | Freq: Two times a day (BID) | RESPIRATORY_TRACT | 0 refills | Status: AC | PRN
Start: 2022-09-20 — End: 2022-09-25
  Filled 2022-09-20: qty 30, 5d supply, fill #0

## 2022-09-20 MED ORDER — DOXYCYCLINE HYCLATE 100 MG PO TABS *I*
100.0000 mg | ORAL_TABLET | Freq: Two times a day (BID) | ORAL | 0 refills | Status: DC
Start: 2022-09-20 — End: 2022-09-23
  Filled 2022-09-20: qty 5, 3d supply, fill #0

## 2022-09-20 MED ORDER — GUAIFENESIN 600 MG PO TB12 *I*
600.0000 mg | ORAL_TABLET | Freq: Two times a day (BID) | ORAL | 0 refills | Status: DC
Start: 2022-09-20 — End: 2023-04-23
  Filled 2022-09-20: qty 10, 5d supply, fill #0

## 2022-09-20 NOTE — Discharge Instructions (Addendum)
Brief Summary of Your Hospital Course (including key procedures and diagnostic test results):  You came to the hospital because of a fever and increased congestion.  You were found to have an upper respiratory infection with possible pneumonia.  You were started on antibiotics and both your vital signs and symptoms improved.  You were deemed medically stable for discharge home.    While you were here, we found a bacteria in your blood called staph epidermidis. We think this was a contaminant from your skin, but you should have repeat blood cultures drawn 5-7 days after discharge. Talk to your primary care doctor about getting this lab work.    Your instructions:  -Take all of your medications as prescribed  -Follow-up with your primary care doctor and be sure to have repeat blood cultures drawn 5-7 days after discharge    New Medications:  -Amoxicillin 1000 mg 3 times a day, ending on Tuesday, 9/26  -Doxycycline 100 mg 2 times a day, ending on Tuesday, 9/26  -Saline nebulizer as needed for congestion  -Mucinex as needed for congestion  -Senna as needed for constipation     Changes to existing medications:  None    What to do after you leave the hospital:    Recommended diet: Regular - No restrictions     Recommended activity: activity as tolerated    Wound Care: Cleanse the sacral wound with normal saline and gently dry. Apply a 3x3 mepilex border foam dressing. Change the dressing daily.     If you experience any of these symptoms within the first 24 hours after discharge:Fever of 101 F. or greater, Chills, or Vomiting  please follow up with the discharge attending Dr. Casimer Leek at phone-number: [phone]    If you experience any of these symptoms 24 hours or more after discharge:Fever greater than 100.5, Chills, or Vomiting  please follow up with your PCP:  Morrie Sheldon [name], DO [phone]
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
## 2022-09-21 ENCOUNTER — Other Ambulatory Visit: Payer: Self-pay

## 2022-09-21 DIAGNOSIS — N390 Urinary tract infection, site not specified: Secondary | ICD-10-CM

## 2022-09-21 DIAGNOSIS — R7881 Bacteremia: Secondary | ICD-10-CM

## 2022-09-21 NOTE — Addendum Note (Signed)
Addended by: Dorma Russell ROSE on: 09/21/2022 12:22 PM     Modules accepted: Orders
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
## 2022-09-23 ENCOUNTER — Ambulatory Visit: Payer: Medicaid Other | Admitting: Family Medicine

## 2022-09-23 ENCOUNTER — Encounter: Payer: Self-pay | Admitting: Family Medicine

## 2022-09-23 ENCOUNTER — Other Ambulatory Visit: Payer: Self-pay

## 2022-09-23 VITALS — BP 110/90 | HR 105 | Ht 65.0 in

## 2022-09-23 DIAGNOSIS — R7881 Bacteremia: Secondary | ICD-10-CM

## 2022-09-23 DIAGNOSIS — J189 Pneumonia, unspecified organism: Secondary | ICD-10-CM

## 2022-09-23 LAB — AEROBIC CULTURE

## 2022-09-23 NOTE — Patient Instructions (Addendum)
Cindy Ochoa,    As always, great to see you to do.  You are doing very well after recent hospitalization for pneumonia.  Please obtain repeat blood cultures as recommended by hospitalist team later this week.    Please also contact your ENT specialist directly to arrange for consult to discuss risk benefits of stoma reversal.    Contact our office if you need a new referral to ENT although I doubt this will be necessary as you are an established patient.    We will work with home Energy manager Ms. Williams to see if we can arrange for some respiratory therapy/percussion therapy at home to help with persistent mucus.    If any clinical worsening of your respiratory symptoms despite this treatment plan please contact our office immediately.    Sincerely yours,  Dr. Morrie Sheldon

## 2022-09-24 ENCOUNTER — Encounter: Payer: Self-pay | Admitting: Family Medicine

## 2022-09-24 ENCOUNTER — Telehealth: Payer: Self-pay

## 2022-09-24 DIAGNOSIS — J9503 Malfunction of tracheostomy stoma: Secondary | ICD-10-CM

## 2022-09-24 LAB — BLOOD CULTURE

## 2022-09-24 NOTE — Telephone Encounter (Signed)
Chief Complaint: Acute Vaginitis    Subjective:      Cindy Ochoa is a 36 y.o. female who called the office with concerns related to abnormal vaginal discharge.     Symptoms have been present for 2 weeks.     Vaginal symptoms: vaginal discharge yellow and non-odorous.      Sexually active: No   No LMP recorded. (Menstrual status: Nexplanon). Contraception: Implanon. Possible pregnancy: no    Last STI screening in the last 3 months? No Any new partners since last STI screening? Pt reports she is not sexually active. Sexually transmitted infection risk:  Pt not sexually active .     Additional Information:     Fever > 100.5'F: no  Antibiotic treatment in last 2 weeks: no  Irregular vaginal bleeding: no      Plan:     The following were reviewed with the patient:    We recommend an in person evaluation for acute vaginitis to be certain of the cause and provide the best treatment.  Offered and accepted appointment on Tuesday, 09/29/22 with same team provider at 0915. 45 min slot booked in treatment room 2 as pt is in a wheelchair and will need hoyer assistance.

## 2022-09-24 NOTE — Telephone Encounter (Signed)
Pended if appropriate

## 2022-09-25 ENCOUNTER — Encounter: Payer: Self-pay | Admitting: Gastroenterology

## 2022-09-25 LAB — BLOOD CULTURE: Bacterial Blood Culture: 0

## 2022-09-25 NOTE — Telephone Encounter (Signed)
Done

## 2022-09-28 ENCOUNTER — Other Ambulatory Visit
Admission: RE | Admit: 2022-09-28 | Discharge: 2022-09-28 | Disposition: A | Discharge status: Home / Self Care | Payer: Medicaid Other | Source: Ambulatory Visit | Attending: Family Medicine | Admitting: Family Medicine

## 2022-09-28 ENCOUNTER — Other Ambulatory Visit: Payer: Self-pay | Admitting: Gastroenterology

## 2022-09-28 DIAGNOSIS — N390 Urinary tract infection, site not specified: Secondary | ICD-10-CM | POA: Insufficient documentation

## 2022-09-28 DIAGNOSIS — R7881 Bacteremia: Secondary | ICD-10-CM | POA: Insufficient documentation

## 2022-09-28 LAB — MULTIPLE ORDERING DOCS

## 2022-09-28 LAB — AEROBIC CULTURE

## 2022-09-29 ENCOUNTER — Ambulatory Visit: Payer: Medicaid Other | Attending: Obstetrics and Gynecology | Admitting: Obstetrics and Gynecology

## 2022-09-29 ENCOUNTER — Other Ambulatory Visit: Payer: Self-pay

## 2022-09-29 ENCOUNTER — Encounter: Payer: Self-pay | Admitting: Obstetrics and Gynecology

## 2022-09-29 VITALS — BP 156/89 | HR 76 | Ht 65.0 in | Wt 145.0 lb

## 2022-09-29 DIAGNOSIS — N760 Acute vaginitis: Secondary | ICD-10-CM | POA: Insufficient documentation

## 2022-09-29 LAB — VAGINITIS SCREEN: DNA PROBE: Vaginitis Screen:DNA Probe: 0

## 2022-09-29 MED ORDER — METRONIDAZOLE 500 MG PO TABS *I*
500.0000 mg | ORAL_TABLET | Freq: Two times a day (BID) | ORAL | 0 refills | Status: AC
Start: 2022-09-29 — End: 2022-10-06

## 2022-09-29 MED ORDER — FLUCONAZOLE 150 MG PO TABS *I*
150.0000 mg | ORAL_TABLET | Freq: Once | ORAL | 0 refills | Status: AC
Start: 2022-09-29 — End: 2022-09-29

## 2022-09-29 NOTE — Progress Notes (Signed)
Jiana Poppert is a 36 y.o. female who presents for evaluation of an abnormal vaginal discharge, increased, thin, yellow for 2 weeks. Discharge started before recent ED to hospital admission for upper respiratory infection. Treated with antibiotics during this admission. Contraception: nexplanon for menstrual suppression. Not currently sexually active. Reports seeing some spotting recently. Sexually transmitted infection risk: very low risk of STD exposure. No LMP recorded. (Menstrual status: Nexplanon).    Patient's medications, allergies, past medical, surgical, social and family histories were reviewed and updated as appropriate.         Objective:     Vitals:    09/29/22 0929   BP: 156/89   Pulse: 76   Weight: 65.8 kg (145 lb)   Height: 1.651 m (5\' 5" )       General appearance: 36 y.o. female. alert, cooperative and no distress  PELVIC:   Ext Gen: Normal female  Vagina: pink, moderate amount of thin brown discharge  Uterus: NSSC, NT  Adnexa: no palpable masses, non-tender  Microscopic wet-mount exam shows excessive bacteria, hyphae.    Arlanda Dareen Piano was present in the exam room at the time the pelvic exam was performed.     Assessment:   Bacterial vaginosis and Monilial vulvo-vaginitis    PLAN:   Rx flagyl and diflucan sent to listed pharmacy. Vaginitis swab collected and will follow up labs as indicated. RTC for AGY and prn.

## 2022-09-29 NOTE — Addendum Note (Signed)
Addended by: Janetta Hora on: 09/29/2022 12:47 PM     Modules accepted: Orders

## 2022-09-30 ENCOUNTER — Ambulatory Visit: Payer: Medicaid Other | Admitting: Physical Medicine and Rehabilitation

## 2022-10-03 LAB — BLOOD CULTURE: Bacterial Blood Culture: 0

## 2022-10-13 ENCOUNTER — Telehealth: Payer: Self-pay

## 2022-10-13 NOTE — Telephone Encounter (Signed)
I attempted to contact Cindy Ochoa this month to check on her well-being. Unfortunately, I was unable to speak with her and left a voicemail requesting her to call me back.

## 2022-10-22 ENCOUNTER — Encounter: Payer: Self-pay | Admitting: Gastroenterology

## 2022-10-23 ENCOUNTER — Ambulatory Visit: Payer: Medicaid Other | Admitting: Obstetrics and Gynecology

## 2022-10-26 ENCOUNTER — Other Ambulatory Visit: Payer: Self-pay

## 2022-10-26 ENCOUNTER — Ambulatory Visit: Payer: Medicaid Other | Admitting: Optometry

## 2022-10-26 DIAGNOSIS — H50111 Monocular exotropia, right eye: Secondary | ICD-10-CM

## 2022-10-26 DIAGNOSIS — H5440 Blindness, one eye, unspecified eye: Secondary | ICD-10-CM
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
## 2022-11-11 ENCOUNTER — Ambulatory Visit: Payer: Medicaid Other | Admitting: Family Medicine

## 2022-11-13 ENCOUNTER — Other Ambulatory Visit: Payer: Self-pay

## 2022-11-13 NOTE — Progress Notes (Signed)
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 She has stable housing; connected to providers; transportation; access to medications; [facility] Supports; and natural resources.    She can be referred again as needed via her PCP's office.    She will Discharge on 11/23/2022

## 2022-11-18 ENCOUNTER — Ambulatory Visit
Payer: Medicaid Other | Attending: Physical Medicine and Rehabilitation | Admitting: Physical Medicine and Rehabilitation

## 2022-11-18 ENCOUNTER — Other Ambulatory Visit: Payer: Self-pay

## 2022-11-18 VITALS — HR 95 | Temp 96.5°F

## 2022-11-18 DIAGNOSIS — M62838 Other muscle spasm: Secondary | ICD-10-CM | POA: Insufficient documentation

## 2022-11-18 MED ORDER — ONABOTULINUMTOXINA 100 UNIT IJ SOLR *I*
100.0000 [IU] | Freq: Once | INTRAMUSCULAR | Status: AC | PRN
Start: 2022-11-18 — End: 2022-11-18
  Administered 2022-11-18: 100 [IU] via INTRAMUSCULAR

## 2022-11-18 NOTE — Procedures (Signed)
PMR Botulinumtoxin Injection    Performed by: Benita Gutter, MD  Authorized by: Benita Gutter, MD    Date/time: 11/18/2022 11:30 AM EST  Injections:  96045 - Chemodenervation 1 ext 1-4 muscles and 40981 - Chemodenervation 1 ext 1-4 muscles, 1st addtl  Medication:  100 units botulinum toxin type A 100 units; 100 units botulinum toxin type A 100 units; 100 units botulinum toxin type A 100 units; 100 units botulinum toxin type A 100 units; 100 units botulinum toxin type A 100 units; 100 units botulinum toxin type A 100 units   See below    Procedure Details   The risks, benefits, indications, potential complications, and alternatives were explained to the patient and/or guardian who verbalized understanding and informed consent obtained.      The area for injection was identified and a time out called to re-identify.       The correct patient was identified with 2 identifiers The correct procedure, location site(s) and laterality were identified with the patient and/or guardian and correspond to the consent form  The appropriate site was marked and verified.  The patient was placed in the correct position.     After prepping the skin with alcohol overlying the following muscles, botlinum toxin was injected intramuscularly as follows.       Right and left quadriceps (vastus lateralis 75, vastus medialis 75, vastus intermedius 75, rectus femoris 75) 300 + 300 units       Botulinum toxin Lot #: [id number]  Botulinum toxin expiration date: 02/2025  Total botox units injected: 600 units   Total botox units wasted: 0 units       The patient tolerated the procedure without complications.      Patient will follow up in 12 weeks for repeat botox injection.      Benita Gutter, MD

## 2022-11-23 ENCOUNTER — Other Ambulatory Visit: Payer: Self-pay

## 2022-11-23 NOTE — Progress Notes (Signed)
Supervisor Discharged Tourist information centre manager. She has stable housing; attends appointments; takes medications; has transportation and natural supports.    She can be referred again via PCP's office to address needs.

## 2022-11-24 ENCOUNTER — Encounter: Payer: Self-pay | Admitting: Gastroenterology

## 2022-12-13 ENCOUNTER — Encounter (INDEPENDENT_AMBULATORY_CARE_PROVIDER_SITE_OTHER): Payer: Self-pay

## 2022-12-30 ENCOUNTER — Ambulatory Visit: Payer: Medicaid Other | Admitting: Otolaryngology

## 2022-12-30 ENCOUNTER — Other Ambulatory Visit
Admission: RE | Admit: 2022-12-30 | Discharge: 2022-12-30 | Disposition: A | Discharge status: Home / Self Care | Payer: Medicaid Other | Source: Ambulatory Visit | Attending: Urology | Admitting: Urology

## 2022-12-30 ENCOUNTER — Other Ambulatory Visit: Payer: Self-pay

## 2022-12-30 ENCOUNTER — Encounter: Payer: Self-pay | Admitting: Otolaryngology

## 2022-12-30 VITALS — BP 127/76 | HR 104 | Ht 65.0 in | Wt 145.0 lb

## 2022-12-30 DIAGNOSIS — N390 Urinary tract infection, site not specified: Secondary | ICD-10-CM | POA: Insufficient documentation

## 2022-12-30 DIAGNOSIS — J9504 Tracheo-esophageal fistula following tracheostomy: Secondary | ICD-10-CM

## 2022-12-31 LAB — AEROBIC CULTURE

## 2023-01-01 ENCOUNTER — Other Ambulatory Visit: Payer: Self-pay | Admitting: Otolaryngology

## 2023-01-01 DIAGNOSIS — G4733 Obstructive sleep apnea (adult) (pediatric): Secondary | ICD-10-CM

## 2023-01-01 DIAGNOSIS — J9504 Tracheo-esophageal fistula following tracheostomy: Secondary | ICD-10-CM

## 2023-01-01 NOTE — Progress Notes (Unsigned)
eRecord imaging/testing/medications ordering process initiated per provider request; to be reviewed and signed at provider discretion

## 2023-01-03 ENCOUNTER — Encounter: Payer: Self-pay | Admitting: Otolaryngology
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
## 2023-01-05 ENCOUNTER — Encounter: Payer: Self-pay | Admitting: Urology

## 2023-01-06 ENCOUNTER — Telehealth: Payer: Self-pay | Admitting: Otolaryngology

## 2023-01-06 ENCOUNTER — Encounter: Payer: Self-pay | Admitting: Urology

## 2023-01-06 ENCOUNTER — Ambulatory Visit: Payer: Medicaid Other | Admitting: Urology

## 2023-01-06 VITALS — Ht 65.0 in | Wt 145.0 lb

## 2023-01-06 DIAGNOSIS — N319 Neuromuscular dysfunction of bladder, unspecified: Secondary | ICD-10-CM

## 2023-01-06 DIAGNOSIS — N281 Cyst of kidney, acquired: Secondary | ICD-10-CM
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
## 2023-01-06 NOTE — Patient Instructions (Signed)
Please call [phone] to schedule imaging at your convenience.

## 2023-01-06 NOTE — Telephone Encounter (Signed)
[facility]  Dr. Verlin Grills  [phone]    BCO-auth requested through [facility]

## 2023-01-07 NOTE — Telephone Encounter (Signed)
[facility]  Dr. Verlin Grills  01/28/23-07/29/23  [phone]

## 2023-01-08 NOTE — Telephone Encounter (Signed)
Called patient and scheduled. After scheduling patient mentioned she is a Quadriplegic and will need a hosier lift. Writer advised patient that she will forward to lab to make sure we can accommodate.

## 2023-01-18 ENCOUNTER — Ambulatory Visit: Payer: Medicaid Other | Attending: Family Medicine | Admitting: Family Medicine

## 2023-01-18 ENCOUNTER — Encounter: Payer: Self-pay | Admitting: Family Medicine

## 2023-01-18 ENCOUNTER — Other Ambulatory Visit: Payer: Self-pay

## 2023-01-18 VITALS — HR 86 | Ht 65.0 in

## 2023-01-18 DIAGNOSIS — E119 Type 2 diabetes mellitus without complications: Secondary | ICD-10-CM | POA: Insufficient documentation

## 2023-01-18 DIAGNOSIS — M810 Age-related osteoporosis without current pathological fracture: Secondary | ICD-10-CM | POA: Insufficient documentation

## 2023-01-18 DIAGNOSIS — F32A Depression, unspecified: Secondary | ICD-10-CM | POA: Insufficient documentation

## 2023-01-18 LAB — POCT HEMOGLOBIN A1C: Hemoglobin A1C,POC: 6.1 % — ABNORMAL HIGH

## 2023-01-18 MED ORDER — SERTRALINE HCL 25 MG PO TABS *I*
ORAL_TABLET | ORAL | 0 refills | Status: DC
Start: 2023-01-18 — End: 2023-02-12

## 2023-01-18 MED ORDER — SERTRALINE HCL 25 MG PO TABS *I*
ORAL_TABLET | ORAL | 0 refills | Status: DC
Start: 2023-01-18 — End: 2023-01-18

## 2023-01-18 NOTE — Assessment & Plan Note (Signed)
Cindy Ochoa is due for repeat DEXA scan.  Ordered today.

## 2023-01-18 NOTE — Patient Instructions (Signed)
In Summary:    Problem List Items Addressed This Visit          High    T2DM, no complications, DIET CONTROLLED ALONE - Primary      HbA1c,POC was last checked on 01/18/2023 and was 6.1% which is at goal of <7. Per our records the patient is UTD on diabetic eye exam. Patient's diabetic foot exam is overdue, and exam was performed today Patient is UTD on microalbuminuria screening.         DIET CONTROLLED ALONE            Relevant Orders    POCT Hemoglobin A1C       Medium    Osteoporosis      [name] is due for repeat DEXA scan.  Ordered today.         Relevant Orders    DEXA Scan    Depression, unspecified depression type (Chronic)      Patient tolerated and did well on sertraline in the past.  Will start low-dose taper and encourage follow-up with mental health resources.  Extensive list of resources provided after visit summary.  Patient primarily interested in telemedicine services for psychotherapy.  I think this is a great idea for her.         Relevant Medications    sertraline (ZOLOFT) 25 mg tablet       MENTAL HEALTH SERVICES / CONTACT INFORMATION    There are a number of good therapists in town it really depends on who is convenient for you to see, who you connect with,  and who is covered by your insurance.   For counseling to be most useful, it is important to have a safe, open, trusting relationship.   Not all therapists will be a good match for what you need.   So it is important to select according to location, apparent fit, and cost, and to "try out" a new therapist with an open mind. If it works, Firefighter. If not, it is right to seek another therapist; don't give up after 1 or 2 unhelpful connections.    You can find a useful list of therapists at the following link this list can be searched for specific skills or interests that might help you find someone who is a good fit for you.    [email or web address]        [facility] (mention you are a patient at [facility]!!)  [address] [place], [place] [address]   [phone]  Bring ID and Insurance Card   No referral needed     Nestor Ramp, PhD   Specializing in Gender Identity Care for all ages  [email or web address]    Rona Ravens, MD  Individual, Couples, and Family Therapy  [address]  [phone], [phone]  [email or web address]    [facility]   [address]   [phone]   Walk in intake Mon-Thurs 9a-3p, Fri 9a-12p   Bring ID and Insurance Card   No referral needed         [facility]   [address]   [phone]   Walk in [place] and Thurs 8:30-9:30am   Bring ID and insurance card   No referral needed         [facility]   [address] [place]   [phone] ext. [phone]   WALK IN HOURS: Tuesdays, Wednesdays, and Thursdays from 9 a.m. to 11 a.m.   Bring ID  and Insurance card   No referral needed         [facility]   [address]   [phone]   Patient can call with insurance info   No referral needed         [facility]   [address]   [phone]   Patient can call with insurance info  No referral needed        [facility]              Adult Ambulatory  [phone]              Family Therapy [phone]              Child/Adolescent  [phone]        [facility]    [address]     [phone]        [facility]    [address]    [phone]        [facility]    Jenelle Mages:   [address]  [phone]     [place]  [address]   [phone]    Therapists:  Rober Minion, [place] [phone]  Sima Matas, LCSW [address], LCSW [phone]  Mosie Epstein, PhD [phone]  Ileene Hutchinson, PhD [phone]  Clance Boll, PhD [phone]  Benard Rink, PhD [phone]  Aviva Signs, PhD [phone]  Jilda Panda , PhD [phone]  Garrel Ridgel, PhD [phone]  Kelton Pillar, PhD  [phone]  Dennison Bulla [phone]  [facility] [address]  Floydene Flock, Phd [phone]  Miki Kins [phone]  Gillermo Murdoch, PhD [phone]  Cherlynn June, PhD [phone]  Pernell Dupre LMFT [phone], LCSW [phone]  Clementeen Graham, PhD [phone]  Buel Ream, PhD [phone]  Janeann [name], PhD [phone]  Lyman Speller, [facility] [phone]  Jarvis Newcomer, CSWR [address] LCSW [place]) [phone]  Lillia Corporal, PhD [phone]     Larger group practices:  [facility]: [phone]  [facility] [phone]  [facility] [phone]  [facility] [phone]   Mental Health [phone]  [facility] [phone]      Psychiatrists:  Shireen Quan MD  [address], [address]  [phone]    Dr. Kelle Darting and Dr. Garnette Scheuermann   [address], [place], [place] [address]   Phone: [phone]     Cleora Fleet, MD   [address]   [phone]     Luberta Robertson, MD   Phone: [phone]     [facility]   [address] [place], [place], [place] [address]     Phone: [phone]       [facility]   [address]   [place], [place] [address]   [phone]       Irving Burton, NP   [address], [place], [place] [address]       Phone: [phone] / Ovidio Hanger cell: [phone]     [name] Clamp, NP   [address] [place], [place] [address]     Phone: [phone]   New patients can e-mail a request to [email or web address]     Alexander Bergeron, NP  [address], [place], [place] [address]  Phone: [phone]  Dr. Misty Stanley Slimmer   [phone]     Jodi Geralds MD  [phone]    Noah Delaine MD   [facility]  [address]  [place]   [phone] or [phone]    [place], [place], & [address] Psychiatry ([place], [place] [employer], [place], [place], Medicare, [employer], Independent  Health, [employer])  3 locations [place], [place], and [place])  [phone] & [phone]               Apps to Help Manage Stress/Anxiety/Depression   (some may require purchase or subscription fees)      Bloom Daily interactive therapy sessions  Guided CBT journaling exercises  100+ exercises based on cognitive behavioral therapy  Analysis of thoughts & emotions  Personal well-being tracker   Mindshift Uses scientifically proven strategies based on Cognitive Behavioural Therapy (CBT) to help you learn to relax and be mindful, develop more effective ways of thinking, and use active steps to take charge of your anxiety.  Get the tools to tackle:  Worry  Panic  Perfectionism  Social Anxiety  Phobias      CBT Thought Diary Thought Diary will help you evaluate, understand, and change your thoughts and feelings. By using this application, you can work to identify your emotions, analyze how and why you're feeling this way, challenge those negative beliefs, change your thinking patterns for future situations, and remember positive experiences. You can use this app as a mood journal, a thought record journal, and a gratitude journal.     Headspace is your guide to everyday mindfulness in just a few minutes a Nyal Schachter. Choose from hundreds of guided meditations on everything from managing stress and anxiety to sleep, productivity, exercise, and physical health -- including short SOS meditations for when you're on the go. And to help you find some space during tough times, Headspace is here for you with the new Weathering the storm pack.   Insight Timer Guided meditations, talks, and music to help reduce anxiety, manage stress, sleep deeply and improve happiness.  40,000 free services available - can filter by "benefit" (e.g. depression, sleep, etc.)   Stop, Breathe, and Think Allows you to check in with your emotions, and recommends short guided meditations, yoga and acupressure videos, tuned to how  you feel    Developed by two clinical psychologists (creators of Moodnotes), MoodKit draws upon the principles and techniques of Cognitive Behavior Therapy (CBT), offering activities to help improve your mood and manage negative feelings related to unhelpful thought patterns.    A library of 500+ guided meditations on topics ranging from anxiety to parenting to focus, with a separate section for sleep.   Breathe2Relax Portable stress management tool which provides detailed information on the effects of stress on the body and instructions and practice exercises to help users learn the stress management skill called diaphragmatic breathing.   What's Up Utilizes CBT (Cognitive Behavioral Therapy) and ACT (Acceptance Commitment Therapy) methods to help cope with depression, anxiety, anger, stress, etc.   Happify Activities based on positive psychology, mindfulness, and cognitive behavioral therapy (CBT) to help overcome negative thoughts and stress.         If you or someone you know requires immediate/emergency care, the following resources are available or call Lifeline at [phone]    [place] COMMUNITY MOBILE CRISIS TEAM (Part of [facility]).    The team is a mobile psychiatric emergency department for individuals  and families within [place] who are experiencing a crisis in mental health. The team's services are designed to divert non lethal individuals and families from the county's psychiatric emergency departments. There are no age restrictions. Individuals/family can be seen by mental health professionals in their home, place of employment, school, etc. Services provided include Crisis intervention/stabilization, mental health assessments and rapid linkage and/or re-linkage to treatment providers.     To access mobile crisis team services, call Lifeline at [phone]    EMERGENCY DEPARTMENTS:   [facility] Emergency Department   [address]    [place],  [address]   [phone]     [facility] Health/[facility]prehensive Psychiatric Emergency Program   [address]   [place], [address]   [phone]     [facility] Emergency Department   [facility]   [address]    [place], [address]   [phone]    National Suicide Prevention Lifeline:   [phone]  Www.suicidepreventionlifeline.org

## 2023-01-18 NOTE — Assessment & Plan Note (Signed)
HbA1c,POC was last checked on 01/18/2023 and was 6.1% which is at goal of <7. Per our records the patient is UTD on diabetic eye exam. Patient's diabetic foot exam is overdue, and exam was performed today Patient is UTD on microalbuminuria screening.         DIET CONTROLLED ALONE

## 2023-01-18 NOTE — Assessment & Plan Note (Signed)
Patient tolerated and did well on sertraline in the past.  Will start low-dose taper and encourage follow-up with mental health resources.  Extensive list of resources provided after visit summary.  Patient primarily interested in telemedicine services for psychotherapy.  I think this is a great idea for her.

## 2023-01-18 NOTE — Progress Notes (Signed)
[facility] Outpatient Progress Note:    Subjective:    Cindy Ochoa is a 37 y.o. female presenting for Follow-up      HPI:    Cindy Ochoa is here today for routine follow up. She feels as though something's "off" with her body.  She hasn't been feeling right.  She was recently told by urology that a test noted some cysts on her kidneys and she has never been told she had this finding in the past, so that has been bothering her.    She reports that her mental health has been suffering.  She has a history of depression which is typically very well-controlled.  She does not take any prescription medication for any condition at the present time.  She does not routinely like taking medications.  But she has felt very down and depressed and the need of some stress reduction.  She reports that her family has been relying on her heavily as the "mother figure", particularly her younger sisters.  Her mom is her primary caretaker and has been supportive but knows that the extended family with a lot of pressure on [name].  She has been having decreased interest in activities, feeling down hopeless and depressed.  She knows that she would benefit from a visit with a therapist and would like  a list of licensed clinical therapist in the area.  She would also be willing to start a medication because she does not feel this way.  Of note she denies any thoughts of harm to self or others.  Denies any suicidal homicidal ideation.    ROS: As stated above    Objective:  Pulse 86   Ht 1.651 m (5\' 5" )   SpO2 98%   BMI 24.13 kg/m     Vitals reviewed  General:    Quadriplegic.  In motorized wheelchair.  Alert oriented x3, no acute distress, conversational, engaging.  HEENT: Mucous membranes moist, sclera anicteric, tracheostomy stoma patent with some scant clear whitish sputum.  Pulm: Occasional end expiratory wheeze, clears with coughing.  No significant paroxysmal cough.  Otherwise clear to auscultation.    Neuro: Spastic  quadriplegia.  Diabetic foot exam performed and patient has no sensation of the bilateral feet for vibratory or pinprick.  Skin is clean dry well-perfused intact with no defects.  Good hygiene noted.  Dermatologic: Some mild hypopigmentation and scar tissue over bony prominence of the bilateral patella without any epidermal skin breakdown erythema or sloughing noted.  Cor: Regular rate and rhythm S1-S2  Abdomen: Soft nontender nondistended normoactive bowel sounds.      Assessment/Plan:    Problem List Items Addressed This Visit          High    T2DM, no complications, DIET CONTROLLED ALONE - Primary      HbA1c,POC was last checked on 01/18/2023 and was 6.1% which is at goal of <7. Per our records the patient is UTD on diabetic eye exam. Patient's diabetic foot exam is overdue, and exam was performed today Patient is UTD on microalbuminuria screening.         DIET CONTROLLED ALONE            Relevant Orders    POCT Hemoglobin A1C       Medium    Osteoporosis      Cindy Ochoa is due for repeat DEXA scan.  Ordered today.         Relevant Orders    DEXA Scan    Depression,  unspecified depression type (Chronic)      Patient tolerated and did well on sertraline in the past.  Will start low-dose taper and encourage follow-up with mental health resources.  Extensive list of resources provided after visit summary.  Patient primarily interested in telemedicine services for psychotherapy.  I think this is a great idea for her.         Relevant Medications    sertraline (ZOLOFT) 25 mg tablet                   Follow up: No follow-ups on file.      [name] Ashliegh Parekh, DO  [facility] Clinical Faculty, Department of [facility] Medicine  [facility]  [address] [place], [place] [address]  Phone: [phone]  Fax: [phone]        To my patients:  Some of my notes are dictated using voice-recognition program which may result in minor transcription errors.  If you have any urgent concerns, please contact me through MyChart.  Please bring  any non-urgent concerns to your next appointment so we can discuss them.  Thank you!

## 2023-01-27 ENCOUNTER — Other Ambulatory Visit: Payer: Self-pay

## 2023-01-27 DIAGNOSIS — R0789 Other chest pain: Secondary | ICD-10-CM

## 2023-01-27 DIAGNOSIS — Z789 Other specified health status: Secondary | ICD-10-CM

## 2023-01-27 DIAGNOSIS — I517 Cardiomegaly: Secondary | ICD-10-CM

## 2023-01-27 DIAGNOSIS — R9431 Abnormal electrocardiogram [ECG] [EKG]: Secondary | ICD-10-CM

## 2023-01-27 DIAGNOSIS — F17210 Nicotine dependence, cigarettes, uncomplicated: Secondary | ICD-10-CM | POA: Insufficient documentation

## 2023-01-27 DIAGNOSIS — R079 Chest pain, unspecified: Secondary | ICD-10-CM | POA: Insufficient documentation

## 2023-01-27 LAB — BASIC METABOLIC PANEL
Anion Gap: 12 (ref 7–16)
CO2: 24 mmol/L (ref 20–28)
Calcium: 9.8 mg/dL (ref 8.8–10.2)
Chloride: 106 mmol/L (ref 96–108)
Creatinine: 0.46 mg/dL — ABNORMAL LOW (ref 0.51–0.95)
Glucose: 126 mg/dL — ABNORMAL HIGH (ref 60–99)
Lab: 11 mg/dL (ref 6–20)
Potassium: 3.6 mmol/L (ref 3.3–5.1)
Sodium: 142 mmol/L (ref 133–145)
eGFR BY CREAT: 126 *

## 2023-01-27 LAB — HOLD BLUE

## 2023-01-27 LAB — CBC AND DIFFERENTIAL
Baso # K/uL: 0 10*3/uL (ref 0.0–0.2)
Basophil %: 0.3 %
Eos # K/uL: 0.1 10*3/uL (ref 0.0–0.5)
Eosinophil %: 1.4 %
Hematocrit: 42 % (ref 34–49)
Hemoglobin: 13.9 g/dL (ref 11.2–16.0)
IMM Granulocytes #: 0 10*3/uL (ref 0.0–0.0)
IMM Granulocytes: 0.3 %
Lymph # K/uL: 3.7 10*3/uL (ref 1.0–5.0)
Lymphocyte %: 47.1 %
MCH: 31 pg (ref 26–32)
MCHC: 33 g/dL (ref 32–36)
MCV: 93 fL (ref 75–100)
Mono # K/uL: 0.4 10*3/uL (ref 0.1–1.0)
Monocyte %: 4.7 %
Neut # K/uL: 3.7 10*3/uL (ref 1.5–6.5)
Nucl RBC # K/uL: 0 10*3/uL (ref 0.0–0.0)
Nucl RBC %: 0 /100 WBC (ref 0.0–0.2)
Platelets: 90 10*3/uL — ABNORMAL LOW (ref 150–450)
RBC: 4.5 MIL/uL (ref 4.0–5.5)
RDW: 14.5 % (ref 0.0–15.0)
Seg Neut %: 46.2 %
WBC: 7.9 10*3/uL (ref 3.5–11.0)

## 2023-01-27 MED ORDER — DEXTROSE 5 % FLUSH FOR PUMPS *I*
0.0000 mL/h | INTRAVENOUS | Status: DC | PRN
Start: 2023-01-27 — End: 2023-01-28

## 2023-01-27 MED ORDER — SODIUM CHLORIDE 0.9 % FLUSH FOR PUMPS *I*
0.0000 mL/h | INTRAVENOUS | Status: DC | PRN
Start: 2023-01-27 — End: 2023-01-28

## 2023-01-27 NOTE — First Provider Contact (Signed)
ED First Provider Contact Note    Initial provider evaluation performed by   ED First Provider Contact       Date/Time Event User Comments    01/27/23 2201 ED First Provider Contact Cindy Ochoa Initial Face to Face Provider Contact            Vital signs reviewed.    Assessment: 37 yo female presenting with chest tightness that is keeping her from sleep. Denies dyspnea, nausea, emesis, palpitations, diaphoresis, headaches, or dizziness.     Orders placed:  EKG and LABS     Patient requires further evaluation.     [place], [place], 01/27/2023, 10:01 PM     [name] Cal [place], [place]  01/27/23 2203

## 2023-01-27 NOTE — ED Triage Notes (Signed)
Pt c/o mid-sternal chest tightness starting earlier today. Denies SOB, N/V/D. Endorses some dizziness. Denies cardiac hx. EKG in triage.       Prehospital medications given: No

## 2023-01-28 ENCOUNTER — Encounter: Payer: Self-pay | Admitting: Emergency Medicine

## 2023-01-28 ENCOUNTER — Emergency Department
Admission: EM | Admit: 2023-01-28 | Discharge: 2023-01-28 | Disposition: A | Discharge status: Home / Self Care | Payer: Medicaid Other | Source: Ambulatory Visit | Attending: Emergency Medicine | Admitting: Emergency Medicine

## 2023-01-28 DIAGNOSIS — R079 Chest pain, unspecified: Secondary | ICD-10-CM

## 2023-01-28 LAB — TROPONIN T 1 HR W/ DELTA HIGH SENSITIVITY
TROP T 0-1 HR DELTA High Sensitivity: 0 (ref 0–2)
TROP T 1 HR High Sensitivity: 7 ng/L (ref 0–11)

## 2023-01-28 LAB — TROPONIN T 3 HR W/ DELTA HIGH SENSITIVITY (IP/ED ONLY)
TROP T 0-3 HR DELTA High Sensitivity: 1 (ref 0–4)
TROP T 3 HR High Sensitivity: 8 ng/L (ref 0–14)

## 2023-01-28 LAB — TROPONIN T 0 HR HIGH SENSITIVITY (IP/ED ONLY): TROP T 0 HR High Sensitivity: 7 ng/L (ref 0–11)
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
## 2023-01-28 NOTE — Discharge Instructions (Signed)
You were seen and evaluated in the emergency department today for pain in the chest that resolved by the time of evaluation. You had lab work which showed no evidence of dehydration, electrolyte abnormality, or cardiac damage.    Please continue to take all home medications as previously prescribed.    You may take tylenol, up to 3g in 24 hours for pain. DO NOT TAKE any further doses of ibuprofen    Please call your primary care doctor and notify them that you were seen in the emergency department. Please follow-up with them in 1-2 days for re-evaluation.    Return to the emergency department if you experience worsening or more persistent pain, shortness of breath, fevers, chills, dizziness, headache, lightheadedness, nausea, vomiting, abdominal pain, or for any additional concerns that you may have.

## 2023-01-28 NOTE — ED Notes (Signed)
Report Given To  Cyndie Chime, RN      Descriptive Sentence / Reason for Admission   Pt c/o mid-sternal chest tightness starting earlier today. Denies SOB, N/V/D. Endorses some dizziness. Denies cardiac hx         Active Issues / Relevant Events   Chest pain  Quadriparesis at C-6  Muscle spasms  Neurogenic bladder      To Do List  Medication per [facility]  Vitals Q 4 hours  Labs and other diagnostics      Anticipatory Guidance / Discharge Planning

## 2023-02-04 LAB — EKG 12-LEAD
P: 48 deg
PR: 166 ms
QRS: 76 deg
QRSD: 77 ms
QT: 349 ms
QTc: 440 ms
Rate: 95 {beats}/min
T: 100 deg

## 2023-02-09 ENCOUNTER — Encounter: Payer: Self-pay | Admitting: Gastroenterology

## 2023-02-10 ENCOUNTER — Other Ambulatory Visit: Payer: Self-pay

## 2023-02-10 ENCOUNTER — Ambulatory Visit
Payer: Medicaid Other | Attending: Physical Medicine and Rehabilitation | Admitting: Physical Medicine and Rehabilitation

## 2023-02-10 ENCOUNTER — Other Ambulatory Visit: Payer: Medicaid Other

## 2023-02-10 VITALS — BP 130/81 | HR 88 | Temp 97.0°F | Ht 65.0 in | Wt 145.0 lb

## 2023-02-10 DIAGNOSIS — M62838 Other muscle spasm: Secondary | ICD-10-CM | POA: Insufficient documentation

## 2023-02-10 MED ORDER — ONABOTULINUMTOXINA 100 UNIT IJ SOLR *I*
100.0000 [IU] | Freq: Once | INTRAMUSCULAR | Status: AC | PRN
Start: 2023-02-10 — End: 2023-02-10
  Administered 2023-02-10: 100 [IU] via INTRAMUSCULAR

## 2023-02-10 NOTE — Procedures (Signed)
PMR Botox Injections    Performed by: Benita Gutter, MD  Authorized by: Benita Gutter, MD    Date/time: 02/10/2023  2:00 PM EST  Injections:  16109 - Chemodenervation 1 ext 1-4 muscles and 60454 - Chemodenervation 1 ext 1-4 muscles, 1st addtl  Medication:  100 units botulinum toxin type A 100 units; 100 units botulinum toxin type A 100 units; 100 units botulinum toxin type A 100 units; 100 units botulinum toxin type A 100 units; 100 units botulinum toxin type A 100 units; 100 units botulinum toxin type A 100 units   See below     Procedure Details   The risks, benefits, indications, potential complications, and alternatives were explained to the patient and/or guardian who verbalized understanding and informed consent obtained.      The area for injection was identified and a time out called to re-identify.       The correct patient was identified with 2 identifiers The correct procedure, location site(s) and laterality were identified with the patient and/or guardian and correspond to the consent form  The appropriate site was marked and verified.  The patient was placed in the correct position.     After prepping the skin with alcohol overlying the following muscles, botlinum toxin was injected intramuscularly as follows.       Right and left quadriceps (vastus lateralis 75, vastus medialis 75, vastus intermedius 75, rectus femoris 75) 300 + 300 units       Botulinum toxin Lot #: [id number]  Botulinum toxin expiration date: 02/2025  Total botox units injected: 600 units   Total botox units wasted: 0 units       The patient tolerated the procedure without complications.      Patient will follow up in 12 weeks for repeat botox injection.      Benita Gutter, MD

## 2023-02-12 ENCOUNTER — Telehealth: Payer: Self-pay | Admitting: Urology

## 2023-02-12 ENCOUNTER — Encounter: Payer: Self-pay | Admitting: Family Medicine

## 2023-02-12 DIAGNOSIS — F32A Depression, unspecified: Secondary | ICD-10-CM

## 2023-02-12 DIAGNOSIS — N319 Neuromuscular dysfunction of bladder, unspecified: Secondary | ICD-10-CM

## 2023-02-12 DIAGNOSIS — N3941 Urge incontinence: Secondary | ICD-10-CM

## 2023-02-12 DIAGNOSIS — G8250 Quadriplegia, unspecified: Secondary | ICD-10-CM

## 2023-02-12 NOTE — Telephone Encounter (Signed)
RLN form completed and faxed

## 2023-02-13 MED ORDER — DISPOSABLE UNDERPADS 30"X36" MISC *A*
99 refills | Status: DC
Start: 2023-02-13 — End: 2023-03-01

## 2023-02-13 MED ORDER — SERTRALINE HCL 50 MG PO TABS *I*
50.0000 mg | ORAL_TABLET | Freq: Every day | ORAL | 0 refills | Status: DC
Start: 2023-02-13 — End: 2023-09-21

## 2023-02-13 MED ORDER — INCONTINENCE SUPPLY DISPOSABLE MISC *A*
5 refills | Status: DC
Start: 2023-02-13 — End: 2023-03-01

## 2023-02-18 ENCOUNTER — Ambulatory Visit
Admission: RE | Admit: 2023-02-18 | Discharge: 2023-02-18 | Disposition: A | Discharge status: Home / Self Care | Payer: Medicaid Other | Source: Ambulatory Visit | Attending: Urology | Admitting: Urology

## 2023-02-18 ENCOUNTER — Other Ambulatory Visit: Payer: Self-pay

## 2023-02-18 DIAGNOSIS — N281 Cyst of kidney, acquired: Secondary | ICD-10-CM | POA: Insufficient documentation

## 2023-02-18 DIAGNOSIS — N319 Neuromuscular dysfunction of bladder, unspecified: Secondary | ICD-10-CM | POA: Insufficient documentation

## 2023-03-01 ENCOUNTER — Telehealth: Payer: Self-pay | Admitting: Urology

## 2023-03-01 ENCOUNTER — Other Ambulatory Visit: Payer: Self-pay | Admitting: Family Medicine

## 2023-03-01 DIAGNOSIS — G8250 Quadriplegia, unspecified: Secondary | ICD-10-CM

## 2023-03-01 DIAGNOSIS — G8254 Quadriplegia, C5-C7 incomplete: Secondary | ICD-10-CM

## 2023-03-01 DIAGNOSIS — N319 Neuromuscular dysfunction of bladder, unspecified: Secondary | ICD-10-CM

## 2023-03-01 DIAGNOSIS — N3941 Urge incontinence: Secondary | ICD-10-CM

## 2023-03-01 MED ORDER — INCONTINENCE SUPPLY DISPOSABLE MISC *A*
5 refills | Status: AC
Start: 2023-03-01 — End: ?

## 2023-03-01 MED ORDER — DISPOSABLE UNDERPADS 30"X36" MISC *A*
99 refills | Status: AC
Start: 2023-03-01 — End: ?

## 2023-03-01 MED ORDER — GENERIC DME *A*
5 refills | Status: DC
Start: 2023-03-01 — End: 2023-10-08

## 2023-03-01 MED ORDER — GENERIC DME *A*
5 refills | Status: AC
Start: 2023-03-01 — End: ?

## 2023-03-01 NOTE — Addendum Note (Signed)
Addended by: Levonne Hubert on: 03/01/2023 11:06 AM     Modules accepted: Orders

## 2023-03-01 NOTE — Telephone Encounter (Signed)
Last appt: 01/18/2023     Next appt:  Visit date not found        Scripts were faxed with out PCP signature     Scripts pended

## 2023-03-01 NOTE — Telephone Encounter (Signed)
Please sign and print

## 2023-03-01 NOTE — Telephone Encounter (Unsigned)
Copied from [place] [phone]. Topic: Medications/Prescriptions - Medication Question/Problem  >> Mar 01, 2023 11:14 AM Caryl Bis C wrote:  Patient called to put in a request for her [employer]. Patient would like the order to go to [facility]. Patient can be reached at [phone]  .

## 2023-03-05 ENCOUNTER — Encounter: Payer: Self-pay | Admitting: Gastroenterology

## 2023-03-05 MED ORDER — OSTOMY SUPPLIES MISC
11 refills | Status: AC
Start: 2023-03-05 — End: ?

## 2023-03-05 MED ORDER — OSTOMY SUPPLIES WAFR
WAFER | 99 refills | Status: AC
Start: 2023-03-05 — End: ?

## 2023-03-05 MED ORDER — OSTOMY SUPPLIES POUCH MISC
11 refills | Status: AC
Start: 2023-03-05 — End: ?

## 2023-03-05 NOTE — Telephone Encounter (Signed)
Faxed and placed in scanning.

## 2023-03-25 ENCOUNTER — Telehealth: Payer: Self-pay

## 2023-03-25 ENCOUNTER — Encounter: Payer: Self-pay | Admitting: Optometry

## 2023-04-01 ENCOUNTER — Other Ambulatory Visit: Payer: Self-pay

## 2023-04-01 ENCOUNTER — Ambulatory Visit: Payer: Medicaid Other | Admitting: Optometry

## 2023-04-01 DIAGNOSIS — H04123 Dry eye syndrome of bilateral lacrimal glands: Secondary | ICD-10-CM

## 2023-04-01 MED ORDER — CARBOXYMETHYLCELLULOSE SODIUM 0.5 % OP SOLN WRAPPED *I*
1.0000 [drp] | Freq: Three times a day (TID) | OPHTHALMIC | 3 refills | Status: DC | PRN
Start: 2023-04-01 — End: 2024-05-16
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
## 2023-04-02 ENCOUNTER — Other Ambulatory Visit: Payer: Self-pay

## 2023-04-02 ENCOUNTER — Inpatient Hospital Stay
Admission: EM | Admit: 2023-04-02 | Discharge: 2023-04-09 | DRG: 133 | Disposition: A | Discharge status: Home / Self Care | Payer: Medicaid Other | Source: Ambulatory Visit | Attending: Internal Medicine | Admitting: Internal Medicine

## 2023-04-02 ENCOUNTER — Emergency Department: Payer: Medicaid Other

## 2023-04-02 ENCOUNTER — Encounter: Payer: Self-pay | Admitting: Gastroenterology

## 2023-04-02 DIAGNOSIS — R0600 Dyspnea, unspecified: Secondary | ICD-10-CM

## 2023-04-02 DIAGNOSIS — R9431 Abnormal electrocardiogram [ECG] [EKG]: Secondary | ICD-10-CM

## 2023-04-02 DIAGNOSIS — R918 Other nonspecific abnormal finding of lung field: Secondary | ICD-10-CM

## 2023-04-02 DIAGNOSIS — Z20822 Contact with and (suspected) exposure to covid-19: Secondary | ICD-10-CM

## 2023-04-02 DIAGNOSIS — J101 Influenza due to other identified influenza virus with other respiratory manifestations: Secondary | ICD-10-CM

## 2023-04-02 DIAGNOSIS — I517 Cardiomegaly: Secondary | ICD-10-CM

## 2023-04-02 DIAGNOSIS — E119 Type 2 diabetes mellitus without complications: Secondary | ICD-10-CM | POA: Diagnosis present

## 2023-04-02 DIAGNOSIS — Z87440 Personal history of urinary (tract) infections: Secondary | ICD-10-CM

## 2023-04-02 DIAGNOSIS — J9601 Acute respiratory failure with hypoxia: Principal | ICD-10-CM | POA: Diagnosis present

## 2023-04-02 DIAGNOSIS — N319 Neuromuscular dysfunction of bladder, unspecified: Secondary | ICD-10-CM | POA: Diagnosis present

## 2023-04-02 DIAGNOSIS — J1001 Influenza due to other identified influenza virus with the same other identified influenza virus pneumonia: Secondary | ICD-10-CM | POA: Diagnosis present

## 2023-04-02 DIAGNOSIS — F17210 Nicotine dependence, cigarettes, uncomplicated: Secondary | ICD-10-CM | POA: Diagnosis present

## 2023-04-02 DIAGNOSIS — R509 Fever, unspecified: Secondary | ICD-10-CM

## 2023-04-02 DIAGNOSIS — Z936 Other artificial openings of urinary tract status: Secondary | ICD-10-CM

## 2023-04-02 DIAGNOSIS — E876 Hypokalemia: Secondary | ICD-10-CM | POA: Diagnosis present

## 2023-04-02 DIAGNOSIS — Z881 Allergy status to other antibiotic agents status: Secondary | ICD-10-CM

## 2023-04-02 DIAGNOSIS — S14106S Unspecified injury at C6 level of cervical spinal cord, sequela: Secondary | ICD-10-CM

## 2023-04-02 DIAGNOSIS — R000 Tachycardia, unspecified: Secondary | ICD-10-CM

## 2023-04-02 DIAGNOSIS — G8250 Quadriplegia, unspecified: Secondary | ICD-10-CM | POA: Diagnosis present

## 2023-04-02 DIAGNOSIS — N390 Urinary tract infection, site not specified: Secondary | ICD-10-CM | POA: Diagnosis present

## 2023-04-02 DIAGNOSIS — U071 COVID-19: Secondary | ICD-10-CM | POA: Diagnosis not present

## 2023-04-02 DIAGNOSIS — G909 Disorder of the autonomic nervous system, unspecified: Secondary | ICD-10-CM

## 2023-04-02 LAB — BHCG, QUANT PREGNANCY: BHCG, QUANT PREGNANCY: <1 m[IU]/mL (ref 0–1)

## 2023-04-02 LAB — CBC AND DIFFERENTIAL
Baso # K/uL: 0 10*3/uL (ref 0.0–0.2)
Eos # K/uL: 0 10*3/uL (ref 0.0–0.5)
Hematocrit: 39 % (ref 34–49)
Hemoglobin: 12.5 g/dL (ref 11.2–16.0)
IMM Granulocytes #: 0 10*3/uL (ref 0.0–0.0)
IMM Granulocytes: 0.3 %
Lymph # K/uL: 0.5 10*3/uL — ABNORMAL LOW (ref 1.0–5.0)
MCV: 94 fL (ref 75–100)
Mono # K/uL: 0.1 10*3/uL (ref 0.1–1.0)
Neut # K/uL: 2.7 10*3/uL (ref 1.5–6.5)
Nucl RBC # K/uL: 0 10*3/uL (ref 0.0–0.0)
Nucl RBC %: 0 /100 WBC (ref 0.0–0.2)
Platelets: 48 10*3/uL — ABNORMAL LOW (ref 150–450)
RBC: 4.1 MIL/uL (ref 4.0–5.5)
RDW: 14.9 % (ref 0.0–15.0)
Seg Neut %: 81.6 %
WBC: 3.3 10*3/uL — ABNORMAL LOW (ref 3.5–11.0)

## 2023-04-02 LAB — LACTATE, PLASMA: Lactate: 1.4 mmol/L (ref 0.5–2.2)

## 2023-04-02 LAB — HEPATIC FUNCTION PANEL
ALT: 13 U/L (ref 0–35)
AST: 20 U/L (ref 0–35)
Albumin: 3.4 g/dL — ABNORMAL LOW (ref 3.5–5.2)
Alk Phos: 37 U/L (ref 35–105)
Bilirubin,Direct: <0.2 mg/dL (ref 0.0–0.3)
Bilirubin,Total: 0.3 mg/dL (ref 0.0–1.2)
Total Protein: 6 g/dL — ABNORMAL LOW (ref 6.3–7.7)

## 2023-04-02 LAB — BLOOD BANK HOLD PINK

## 2023-04-02 LAB — BLOOD CULTURE
Bacterial Blood Culture: 0
Bacterial Blood Culture: 0

## 2023-04-02 LAB — COVID/INFLUENZA A & B/RSV NAAT (PCR)
COVID-19 NAAT (PCR): NEGATIVE
Influenza A NAAT (PCR): POSITIVE — AB
Influenza B NAAT (PCR): NEGATIVE
RSV NAAT (PCR): NEGATIVE

## 2023-04-02 LAB — URINE MICROSCOPIC (IQ200)

## 2023-04-02 LAB — S. PNEUMONIAE ANTIGEN: S. pneumoniae Antigen: POSITIVE — AB

## 2023-04-02 LAB — PHOSPHORUS: Phosphorus: 2.4 mg/dL — ABNORMAL LOW (ref 2.7–4.5)

## 2023-04-02 LAB — PLASMA PROF 7 (ED ONLY)
Anion Gap,PL: 13 (ref 7–16)
CO2,Plasma: 21 mmol/L (ref 20–28)
Chloride,Plasma: 104 mmol/L (ref 96–108)
Creatinine: 0.45 mg/dL — ABNORMAL LOW (ref 0.51–0.95)
Glucose,Plasma: 127 mg/dL — ABNORMAL HIGH (ref 60–99)
Potassium,Plasma: 3.4 mmol/L (ref 3.3–4.6)
Sodium,Plasma: 138 mmol/L (ref 133–145)
UN,Plasma: 14 mg/dL (ref 6–20)
eGFR BY CREAT: 126 *

## 2023-04-02 LAB — URINALYSIS REFLEX TO CULTURE
Glucose,UA: NEGATIVE
Nitrite,UA: POSITIVE — AB
Specific Gravity,UA: 1.012 (ref 1.002–1.030)
pH,UA: 6.5 (ref 5.0–8.0)

## 2023-04-02 LAB — AEROBIC CULTURE: Aerobic Culture: 0

## 2023-04-02 LAB — MAGNESIUM: Magnesium: 1.5 mg/dL — ABNORMAL LOW (ref 1.6–2.5)

## 2023-04-02 LAB — LACTATE, VENOUS, WHOLE BLOOD: Lactate VEN,WB: 1.7 mmol/L (ref 0.5–2.2)

## 2023-04-02 LAB — PREGNANCY, URINE: Preg Test,UR: NEGATIVE

## 2023-04-02 LAB — HOLD SST

## 2023-04-02 LAB — LEGIONELLA ANTIGEN, URINE: Legionella Antigen (Urine): 0

## 2023-04-02 LAB — PERFORMING LAB

## 2023-04-02 MED ORDER — CEFTRIAXONE SODIUM 2 G IN STERILE WATER 20ML SYRINGE *I*
2000.0000 mg | INTRAVENOUS | Status: AC
Start: 2023-04-02 — End: 2023-04-06
  Administered 2023-04-02 – 2023-04-06 (×5): 2000 mg via INTRAVENOUS
  Filled 2023-04-02 (×2): qty 20
  Filled 2023-04-02 (×2): qty 2000
  Filled 2023-04-02: qty 20

## 2023-04-02 MED ORDER — ONDANSETRON HCL 2 MG/ML IV SOLN *I*
4.0000 mg | Freq: Four times a day (QID) | INTRAMUSCULAR | Status: DC | PRN
Start: 2023-04-02 — End: 2023-04-02
  Administered 2023-04-02: 4 mg via INTRAVENOUS
  Filled 2023-04-02: qty 2

## 2023-04-02 MED ORDER — ACETAMINOPHEN 500 MG PO TABS *I*
1000.0000 mg | ORAL_TABLET | Freq: Once | ORAL | Status: AC
Start: 2023-04-02 — End: 2023-04-02
  Administered 2023-04-02: 1000 mg via ORAL
  Filled 2023-04-02: qty 2

## 2023-04-02 MED ORDER — CARBOXYMETHYLCELLULOSE SODIUM 0.5 % OP SOLN WRAPPED *I*
1.0000 [drp] | Freq: Three times a day (TID) | OPHTHALMIC | Status: DC | PRN
Start: 2023-04-02 — End: 2023-04-09

## 2023-04-02 MED ORDER — POLYETHYLENE GLYCOL 3350 PO PACK 17 GM *I*
17.0000 g | PACK | Freq: Every day | ORAL | Status: DC
Start: 2023-04-02 — End: 2023-04-09
  Administered 2023-04-02 – 2023-04-04 (×2): 17 g via ORAL
  Filled 2023-04-02 (×6): qty 17

## 2023-04-02 MED ORDER — ACETAMINOPHEN 325 MG PO TABS *I*
975.0000 mg | ORAL_TABLET | Freq: Once | ORAL | Status: AC
Start: 2023-04-02 — End: 2023-04-02
  Administered 2023-04-02: 975 mg via ORAL
  Filled 2023-04-02: qty 3

## 2023-04-02 MED ORDER — FONDAPARINUX SODIUM 2.5 MG/0.5ML SC SOLN *I*
2.5000 mg | Freq: Every day | SUBCUTANEOUS | Status: DC
Start: 2023-04-02 — End: 2023-04-03

## 2023-04-02 MED ORDER — DEXTROSE 5 % FLUSH FOR PUMPS *I*
0.0000 mL/h | INTRAVENOUS | Status: DC | PRN
Start: 2023-04-02 — End: 2023-04-09

## 2023-04-02 MED ORDER — K PHOS DI & MONO-SOD PHOS MONO 155-852-130 MG PO TABS*I*
2.0000 | ORAL_TABLET | Freq: Two times a day (BID) | ORAL | Status: AC
Start: 2023-04-02 — End: 2023-04-05
  Administered 2023-04-02 – 2023-04-05 (×6): 2 via ORAL
  Filled 2023-04-02 (×7): qty 2

## 2023-04-02 MED ORDER — OXYMETAZOLINE HCL 0.05 % NA SOLN *I*
1.0000 | Freq: Once | NASAL | Status: AC
Start: 2023-04-02 — End: 2023-04-02
  Administered 2023-04-02: 1 via NASAL
  Filled 2023-04-02: qty 15

## 2023-04-02 MED ORDER — AMPICILLIN-SULBACTAM IN NS 3 GM *I*
3000.0000 mg | Freq: Four times a day (QID) | INTRAMUSCULAR | Status: DC
Start: 2023-04-02 — End: 2023-04-02
  Administered 2023-04-02: 3000 mg via INTRAVENOUS
  Filled 2023-04-02: qty 110

## 2023-04-02 MED ORDER — MAGNESIUM CHLORIDE 64 MG PO TBEC *I*
64.0000 mg | DELAYED_RELEASE_TABLET | Freq: Every day | ORAL | Status: AC
Start: 2023-04-02 — End: 2023-04-08
  Administered 2023-04-02 – 2023-04-08 (×7): 64 mg via ORAL
  Filled 2023-04-02 (×8): qty 1

## 2023-04-02 MED ORDER — BISACODYL 10 MG RE SUPP *I*
10.0000 mg | RECTAL | Status: DC
Start: 2023-04-02 — End: 2023-04-07
  Administered 2023-04-04: 10 mg via RECTAL
  Filled 2023-04-02 (×2): qty 1

## 2023-04-02 MED ORDER — ACETAMINOPHEN 325 MG PO TABS *I*
650.0000 mg | ORAL_TABLET | ORAL | Status: DC | PRN
Start: 2023-04-02 — End: 2023-04-09
  Administered 2023-04-02 – 2023-04-08 (×7): 650 mg via ORAL
  Filled 2023-04-02 (×7): qty 2

## 2023-04-02 MED ORDER — OSELTAMIVIR PHOSPHATE 75 MG PO CAPS *I*
75.0000 mg | ORAL_CAPSULE | Freq: Two times a day (BID) | ORAL | Status: AC
Start: 2023-04-02 — End: 2023-04-06
  Administered 2023-04-02 – 2023-04-06 (×10): 75 mg via ORAL
  Filled 2023-04-02 (×12): qty 1

## 2023-04-02 MED ORDER — LACTATED RINGERS IV BOLUS *I*
1000.0000 mL | Freq: Once | INTRAVENOUS | Status: AC
Start: 2023-04-02 — End: 2023-04-02
  Administered 2023-04-02: 1000 mL via INTRAVENOUS

## 2023-04-02 MED ORDER — SODIUM CHLORIDE 0.9 % FLUSH FOR PUMPS *I*
0.0000 mL/h | INTRAVENOUS | Status: DC | PRN
Start: 2023-04-02 — End: 2023-04-09
  Administered 2023-04-08: 25 mL/h via INTRAVENOUS

## 2023-04-02 MED ORDER — DOXYCYCLINE 100 MG / 110 ML NS *I*
100.0000 mg | Freq: Two times a day (BID) | INTRAVENOUS | Status: DC
Start: 2023-04-02 — End: 2023-04-05
  Administered 2023-04-02 – 2023-04-05 (×7): 100 mg via INTRAVENOUS
  Filled 2023-04-02 (×7): qty 100

## 2023-04-02 NOTE — Bed Hold Note (Signed)
Bed: AC-23R  Expected date:   Expected time:   Means of arrival:   Comments:  12L

## 2023-04-02 NOTE — ED Notes (Signed)
Low air loss mattress ordered from [facility]. -ED CRN

## 2023-04-02 NOTE — ED Notes (Signed)
Report Given To  Irish Lack, RN      Descriptive Sentence / Reason for Admission   Pt endorsing SOB since Monday, increasingly worse over the last day. Per EMS pt was 88% on RA, placed on 6L with positive effect. Reports she feels like she has a fever and is concerned that she has pneumonia. Denies chest pain. Quadriplegic.     Flu+ / PNA+      Active Issues / Relevant Events   FULL CODE   Flu+  UA: nitrite +  Tele   Regular Diet   Elevate HOB  4L NC w/ humidifier   Phos 2.4 / Mag 1.5  USIV needed       To Do List  Weight  VS/A/Neuro q 4  Meds per MAR   Strict I/Os  Bed change       Anticipatory Guidance / Discharge Planning  Flu+/Admit

## 2023-04-02 NOTE — Progress Notes (Signed)
Hospital Medicine Progress Note   LOS: 1 day  Full Code    24 HOUR/OVERNIGHT EVENTS     Febrile overnight (Tmax recorded at 103.7).   Bands of 22% this AM. Strep penumoniae antigen positive overnight.    SUBJECTIVE     Saw patient at bedside this AM. Feeling improved from prior. Was suctioned overnight and says that this helped her tremendously to feel better.  Denies any new subjective fevers or chills.     OBJECTIVE      Vitals Range In/Out   BP: (89-187)/(55-106)   Temp:  [36.9 C (98.4 F)-39.8 C (103.7 F)]   Temp src: Temporal (04/06 0452)  Heart Rate:  [94-147]   Resp:  [22-39]   SpO2:  [84 %-99 %]   Height:  [165.1 cm (5\' 5" )]   Weight:  [63.5 kg (140 lb)]    Intake/Output Summary (Last 24 hours) at 04/03/2023 0517  Last data filed at 04/02/2023 1949  Gross per 24 hour   Intake 2600 ml   Output 1300 ml   Net 1300 ml        Vitals:    04/03/23 0452   BP: 90/57   Pulse: 102   Resp: (!) 27   Temp: 36.9 C (98.4 F)   Weight:    Height:        PHYSICAL EXAM:  General: reclined in hospital bed, NAD  CV: RRR, normal S1/S2, no MRG appreciated.  Pulmonary: normal WOB but significant crackles in bilateral lung fields.  Abdominal: soft, nontender, nondistended  Extremities: minimal lower extremity edema.  Skin: warm and dry    STUDIES AND RESULTS:  BMP/Electrolytes CBC   Recent Labs     04/02/23  1023 04/02/23  1021   Creatinine 0.45*  --    Magnesium  --  1.5*   Albumin  --  3.4*   Phosphorus  --  2.4*    Recent Labs   Lab 04/03/23  0440 04/02/23  0837   WBC 6.2 3.3*   Hemoglobin 11.4 12.5   Hematocrit 35 39   Platelets 44* 48*   Neut # K/uL  --  2.7   Lymph # K/uL  --  0.5*   Mono # K/uL  --  0.1     No results for input(s): "INR", "PTT" in the last 72 hours.    No components found with this basename: "PT"   GI/Liver Function Other   Recent Labs     04/02/23  1021   AST 20   ALT 13   Bilirubin,Total 0.3   Bilirubin,Direct <0.2     No components found with this basename: "ALKPHOS"   Recent Labs   Lab 04/02/23  1021    Phosphorus 2.4*     No results for input(s): "CHOL", "TRIG", "HDL", "LDL", "LDLC", "NHDLC", "CHHDC" in the last 168 hours.  No results for input(s): "TROP", "CK", "MCKMB", "RI", "BNP", "LD" in the last 168 hours.     Micro:   Aerobic Culture   Date Value Ref Range Status   04/02/2023 .  Preliminary   12/30/2022 Escherichia coli (!)  Final     Comment:     >100,000/ml  Isolate identified by MALDI-TOF  .       Anaerobic Culture   Date Value Ref Range Status   03/20/2020 Anaerobic gram positive cocci (!)  Final     Comment:     1+   03/20/2020 Cutibacterium avidum (!)  Final  Comment:     1+   03/20/2020 Cutibacterium acnes (!)  Final     Comment:     3 colonies  Formerly Propionibacterium acnes       Bacterial Blood Culture   Date Value Ref Range Status   04/02/2023 .  Preliminary   04/02/2023 .  Preliminary         Imaging:   *Chest STANDARD single view    Result Date: 04/02/2023  Left lower lobe airspace retrocardiac opacities and layering small amount of pleural fluid. Patchy right basilar airspace opacities. Findings likely due to infection or aspiration in appropriate clinical scenario. END OF IMPRESSION I have personally reviewed the images and the Resident's/Fellow's interpretation and agree with or edited the findings.       [facility] submits this DICOM format image data and final report to the [facility], an independent secure electronic health information exchange, on a reciprocally searchable basis (with patient authorization) for a minimum of 12 months after exam date.    Scheduled Meds:   doxycycline IV  100 mg Intravenous Q12H    oseltamivir  75 mg Oral BID    cefTRIAXone  2,000 mg Intravenous Q24H    fondaparinux  2.5 mg Subcutaneous Daily    magnesium chloride  64 mg Oral Daily    potassium and sodium phosphate  2 tablet Oral BID    bisacodyl  10 mg Rectal Every Other Day    polyethylene glycol  17 g Oral Daily     Continuous Infusions:  PRN Meds: sodium chloride, dextrose, acetaminophen,  carboxymethylcellulose      ASSESSMENT & PLAN     Assessment:   Cindy Ochoa is a 37 y.o. female with PMHx significant for C6 injury with resultant quadriparesis, ileal conduit urostomy, neurogenic bowel, and decubitus ulcer who presented to [facility] with sepsis and AHRF in the setting of influenza A pneumonia. Strep pneumoniae antigen positive.       Plan:    #AHRF in the setting of influenza A pneumonia  #Strep pneumoniae antigen positive  - Strep pneumoniae antigen positive overnight. Febrile to Tmax of 103.   - No WBC count, but bands of 22%  - Rocephin 2g daily x 5 days + doxycycline 100mg  BID x 5 days  - Tamiflu 75mg  BID x 5 days  - Blood cultures NGTD  - MRSA nares pending  - Strep pneumoniae antigen positive. Legionella antigen negative.    #Chronic UTI with urostomy ileal conduit  - UA positive for nitrites, 1+ leukocyte esterase  - 21-50 WBC, 4+ bacteria  - Urine culture NGTD    #T2DM - diet controlled  - Controlled - continue to monitor.    #Hypophosphatemia  - K-Phos 2 tablets BID    F: PO as tolerated  E: no acute concerns, BMP daily.  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 Heron Nay, MD.    Donie Lemelin L. Ellard Artis, MD MPH  Internal Medicine  PGY-2  5:17 AM 04/03/2023

## 2023-04-02 NOTE — ED Notes (Signed)
Report Given To  Carla RN      Descriptive Sentence / Reason for Admission   Pt endorsing SOB since Monday, increasingly worse over the last day. Per EMS pt was 88% on RA, placed on 6L with positive effect. Reports she feels like she has a fever and is concerned that she has pneumonia. Denies chest pain. Quadriplegic.     Flu+ / PNA+      Active Issues / Relevant Events   FULL CODE   Flu+  UA: nitrite +  Tele   Regular Diet   Elevate HOB  2L o2 via NP  Quadriplegic -       To Do List  VS/A/Neuro q 4  Meds per MAR   Strict I/Os  Telemetry  Prefers a dark room        Anticipatory Guidance / Discharge Planning  Flu+ A /Admit
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
## 2023-04-02 NOTE — ED Notes (Signed)
ED NURSE RESIDENT ATTESTATION       I Cindy Ochoa have reviewed the following charting information by the Nurse Resident: MD    Nursing Assessments  Medications  Plan of Care  Teaching   Notes    In the chart of Cindy Ochoa (37 y.o. female) and attest to the charting being accurate.

## 2023-04-02 NOTE — ED Notes (Signed)
Provider coming to place USIV. Will obtain second set of cultures. Per provider, waiting to start ABX until second set of cultures is obtained.

## 2023-04-02 NOTE — ED Notes (Addendum)
Writer spoke with resident regarding patients febrile temperature and lack of change after administering Acetaminophen. Writer providing patient with cold compress.

## 2023-04-02 NOTE — Consults (Signed)
Ultrasound Guided Peripheral IV    Indication: Difficult Access  Ordering Provider: Not Applicable    Procedure Details:  Patient verification with 2 identifiers: Yes  Site: LUA  Site marked: No  Vessel depth: 0.75cm  Vessel diameter: 0.35cm    Analgesia: none    Site prepared with 2% Chlorhexidine. Using no touch technique vessel cannulated with a 20 gauge 2" intercath under ultrasound guidance. Evidence of brisk blood return and ease of flush noted. Line secured.    Findings:  Patient did tolerate procedure well.  Number of Attempts:1  Complications: No    Education provided to patient and bedside nurse: Yes    Performed by: Davonna Belling, RN

## 2023-04-02 NOTE — ED Notes (Signed)
Vascular access team at beside attempting line

## 2023-04-02 NOTE — ED Notes (Signed)
ED NURSE RESIDENT ATTESTATION       I Earney Mallet M,RN have reviewed the following charting information by the Nurse Resident: Margo Aye, RN    Nursing Assessments  Medications  Plan of Care  Teaching   Notes    In the chart of Cindy Ochoa (37 y.o. female) and attest to the charting being accurate.

## 2023-04-02 NOTE — ED Triage Notes (Signed)
Pt endorsing SOB since Monday, increasingly worse over the last day. Per EMS pt was 88% on RA, placed on 6L with positive effect. Reports she feels like she has a fever and is concerned that she has pneumonia. Denies chest pain. Quadriplegic.    Blood Glucose Meter (mg/dl): 540  IV Solution: Normal saline  I.V.: 500 mL  Prehospital medications given: No
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
## 2023-04-03 ENCOUNTER — Inpatient Hospital Stay: Payer: Medicaid Other

## 2023-04-03 DIAGNOSIS — J9811 Atelectasis: Secondary | ICD-10-CM

## 2023-04-03 DIAGNOSIS — J90 Pleural effusion, not elsewhere classified: Secondary | ICD-10-CM

## 2023-04-03 LAB — DIFF MANUAL
Bands %: 22 % (ref 0–10)
Diff Based On: 114 CELLS

## 2023-04-03 LAB — PHOSPHORUS: Phosphorus: 2.1 mg/dL — ABNORMAL LOW (ref 2.7–4.5)

## 2023-04-03 LAB — CBC AND DIFFERENTIAL
Baso # K/uL: 0 10*3/uL (ref 0.0–0.2)
Eos # K/uL: 0 10*3/uL (ref 0.0–0.5)
Hematocrit: 35 % (ref 34–49)
Hemoglobin: 11.4 g/dL (ref 11.2–16.0)
Lymph # K/uL: 0.7 10*3/uL — ABNORMAL LOW (ref 1.0–5.0)
MCV: 92 fL (ref 75–100)
Mono # K/uL: 0.1 10*3/uL (ref 0.1–1.0)
Neut # K/uL: 5.3 10*3/uL (ref 1.5–6.5)
Nucl RBC # K/uL: 0 10*3/uL (ref 0.0–0.0)
Nucl RBC %: 0 /100 WBC (ref 0.0–0.2)
Platelets: 44 10*3/uL — ABNORMAL LOW (ref 150–450)
RBC: 3.8 MIL/uL — ABNORMAL LOW (ref 4.0–5.5)
RDW: 14.9 % (ref 0.0–15.0)
Seg Neut %: 64 %
WBC: 6.2 10*3/uL (ref 3.5–11.0)

## 2023-04-03 LAB — BASIC METABOLIC PANEL
Anion Gap: 15 (ref 7–16)
CO2: 22 mmol/L (ref 20–28)
Calcium: 8.5 mg/dL — ABNORMAL LOW (ref 8.8–10.2)
Chloride: 102 mmol/L (ref 96–108)
Creatinine: 0.48 mg/dL — ABNORMAL LOW (ref 0.51–0.95)
Glucose: 102 mg/dL — ABNORMAL HIGH (ref 60–99)
Lab: 9 mg/dL (ref 6–20)
Potassium: 3.6 mmol/L (ref 3.3–5.1)
Sodium: 139 mmol/L (ref 133–145)
eGFR BY CREAT: 124 *

## 2023-04-03 LAB — MAGNESIUM: Magnesium: 1.7 mg/dL (ref 1.6–2.5)

## 2023-04-03 LAB — MRSA NAAT (PCR): MRSA NAAT (PCR): NEGATIVE

## 2023-04-03 NOTE — ED Notes (Signed)
Boosted patient up in bed, patient sitting up, leg spasms quieted after move.  Patient resting comfortably, no distress noted, respirations even and unlabored.

## 2023-04-03 NOTE — ED Notes (Signed)
Received report from, [employer]. Assuming pt care, pt visualized to be in no acute distress, respirations regular and equal, call bell in reach. Will continue to monitor and treat per orders.

## 2023-04-03 NOTE — ED Notes (Signed)
Report Given To  Erie Noe RN      Descriptive Sentence / Reason for Admission   Pt endorsing SOB since Monday, increasingly worse over the last day. Per EMS pt was 88% on RA, placed on 6L with positive effect. Reports she feels like she has a fever and is concerned that she has pneumonia. Denies chest pain. Quadriplegic.     Flu+ / PNA+      Active Issues / Relevant Events   FULL CODE   Flu+  UA: nitrite +  Tele   Regular Diet   Elevate HOB  2L o2 via NC  Quadriplegic -   Bands 22%      To Do List  VS/A/Neuro q 4  Meds per MAR   Strict I/Os  Telemetry  Prefers a dark room        Anticipatory Guidance / Discharge Planning  Flu+ A /Admit

## 2023-04-03 NOTE — ED Notes (Signed)
Report Given To  Jeri Modena, RN      Descriptive Sentence / Reason for Admission   Pt endorsing SOB since Monday, increasingly worse over the last day. Per EMS pt was 88% on RA, placed on 6L with positive effect. Reports she feels like she has a fever and is concerned that she has pneumonia. Denies chest pain. Quadriplegic.     Flu+ / PNA+      Active Issues / Relevant Events   FULL CODE   Flu+  UA: nitrite +  Tele   Regular Diet   Elevate HOB  8L 30% via TC; 4L NC  Quadriplegic -   Bands 22%      To Do List  VS/A/Neuro q 4  Meds per MAR   Strict I/Os  Telemetry  Prefers a dark room  PRN suctioning of stoma        Anticipatory Guidance / Discharge Planning  Flu+ A /Admit

## 2023-04-03 NOTE — ED Notes (Signed)
Upon taking over care for this patient, oxygen level was 93-94% on room air. Patient asked to be suctioned so I went ahead and suctioned her. After suctioning she is only 84-86% with a trach mask on 6L of oxygen because she says she breathes from her stoma. Respiratory came down and placed patient on humidified oxygen at 15L via trach mask. Provider notified and chest x-ray ordered. Provider stated they will be down to see patient.

## 2023-04-03 NOTE — ED Notes (Signed)
Report Given To  [facility] RN      Descriptive Sentence / Reason for Admission   Pt endorsing SOB since Monday, increasingly worse over the last day. Per EMS pt was 88% on RA, placed on 6L with positive effect. Reports she feels like she has a fever and is concerned that she has pneumonia. Denies chest pain. Quadriplegic.     Flu+ / PNA+      Active Issues / Relevant Events   FULL CODE   Flu+  UA: nitrite +  Tele   Regular Diet   Elevate HOB  2L o2 via NC  Quadriplegic -   Bands 22%      To Do List  VS/A/Neuro q 4  Meds per MAR   Strict I/Os  Telemetry  Prefers a dark room        Anticipatory Guidance / Discharge Planning  Flu+ A /Admit

## 2023-04-03 NOTE — Progress Notes (Signed)
Pt was requesting tracheal suctioning stating that she could not clear her secretions. Pt was suctioned with 10 french ballard for large amount of clear/white secretions.

## 2023-04-03 NOTE — Progress Notes (Signed)
Called for persistent desats on this patient.   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   Patient states that she prefers to breathe through her stoma and therefore prefers a trach collar with o2.  At 15L 100%  via trach collar the patient does not sat above 88%.  Patient was suctioned through her stoma with a 66fr cathetar and copious amounts of thick green secretions are seen in the tubing.  However this does not resolve the saturation issue with the TC in place.  In observing patient I can see that she is actually breathing through her nose better so NC is placed at 4L  for oxygenation in conjunction with the trach collar per patient preference.  Patient sats jump to 97% with the NC in place.      It is advised to leave the NC in place for proper oxygenation.   The trach collar is purely for patient comfort and some humidification at this point.

## 2023-04-03 NOTE — ED Notes (Addendum)
Patient hit call button, wanted to have sputum from stoma wiped off chest.  Also requesting to have stoma suctioned because she is unable to clear the secretions from her lungs.  Consulted with respiratory therapist to have patient suctioned.

## 2023-04-04 DIAGNOSIS — A419 Sepsis, unspecified organism: Secondary | ICD-10-CM

## 2023-04-04 DIAGNOSIS — D696 Thrombocytopenia, unspecified: Secondary | ICD-10-CM

## 2023-04-04 DIAGNOSIS — K592 Neurogenic bowel, not elsewhere classified: Secondary | ICD-10-CM

## 2023-04-04 DIAGNOSIS — J101 Influenza due to other identified influenza virus with other respiratory manifestations: Secondary | ICD-10-CM

## 2023-04-04 DIAGNOSIS — J154 Pneumonia due to other streptococci: Secondary | ICD-10-CM

## 2023-04-04 DIAGNOSIS — G8254 Quadriplegia, C5-C7 incomplete: Secondary | ICD-10-CM

## 2023-04-04 LAB — BASIC METABOLIC PANEL
Anion Gap: 11 (ref 7–16)
CO2: 28 mmol/L (ref 20–28)
Calcium: 8.7 mg/dL — ABNORMAL LOW (ref 8.8–10.2)
Chloride: 104 mmol/L (ref 96–108)
Creatinine: 0.39 mg/dL — ABNORMAL LOW (ref 0.51–0.95)
Glucose: 99 mg/dL (ref 60–99)
Lab: 7 mg/dL (ref 6–20)
Potassium: 3.2 mmol/L — ABNORMAL LOW (ref 3.3–5.1)
Sodium: 143 mmol/L (ref 133–145)
eGFR BY CREAT: 131 *

## 2023-04-04 LAB — DIFF MANUAL
Bands %: 6 % (ref 0–10)
Diff Based On: 200 CELLS
Metamyelocyte %: 1 % (ref 0–1)
React Lymph %: 2 % (ref 0–6)

## 2023-04-04 LAB — CBC AND DIFFERENTIAL
Baso # K/uL: 0 10*3/uL (ref 0.0–0.2)
Eos # K/uL: 0 10*3/uL (ref 0.0–0.5)
Hematocrit: 34 % (ref 34–49)
Hemoglobin: 11.2 g/dL (ref 11.2–16.0)
Lymph # K/uL: 2 10*3/uL (ref 1.0–5.0)
MCV: 92 fL (ref 75–100)
Mono # K/uL: 0.5 10*3/uL (ref 0.1–1.0)
Neut # K/uL: 3.9 10*3/uL (ref 1.5–6.5)
Nucl RBC # K/uL: 0 10*3/uL (ref 0.0–0.0)
Nucl RBC %: 0 /100 WBC (ref 0.0–0.2)
Platelets: 37 10*3/uL — ABNORMAL LOW (ref 150–450)
RBC: 3.6 MIL/uL — ABNORMAL LOW (ref 4.0–5.5)
RDW: 14.9 % (ref 0.0–15.0)
Seg Neut %: 55 %
WBC: 6.4 10*3/uL (ref 3.5–11.0)

## 2023-04-04 LAB — PHOSPHORUS: Phosphorus: 3 mg/dL (ref 2.7–4.5)

## 2023-04-04 LAB — MAGNESIUM: Magnesium: 1.9 mg/dL (ref 1.6–2.5)

## 2023-04-04 MED ORDER — ONDANSETRON HCL 2 MG/ML IV SOLN *I*
4.0000 mg | Freq: Four times a day (QID) | INTRAMUSCULAR | Status: DC | PRN
Start: 2023-04-04 — End: 2023-04-09
  Administered 2023-04-04: 4 mg via INTRAVENOUS
  Filled 2023-04-04: qty 2

## 2023-04-04 MED ORDER — POTASSIUM CHLORIDE CRYS CR 20 MEQ PO TBCR *I*
40.0000 meq | ORAL_TABLET | Freq: Once | ORAL | Status: AC
Start: 2023-04-04 — End: 2023-04-04
  Administered 2023-04-04: 40 meq via ORAL
  Filled 2023-04-04: qty 2

## 2023-04-04 NOTE — ED Notes (Signed)
Pt repositioned in bed. Pillow under knees and heels.

## 2023-04-04 NOTE — ED Notes (Signed)
SpO2 titrated to RA from 1L NC. No increased WOB or respiratory distress. Will continue to monitor.

## 2023-04-04 NOTE — ED Notes (Signed)
SpO2 dropped to 89% on RA. NC re-applied at 1L to maintain 98%.

## 2023-04-04 NOTE — ED Notes (Signed)
Pt remains stable on RA and maintaining SpO2 >93% without increased WOB or respiratory distress.

## 2023-04-04 NOTE — ED Notes (Signed)
Report Given To  Anthonio, RN      Descriptive Sentence / Reason for Admission   Pt endorsing SOB since Monday, increasingly worse over the last day. Per EMS pt was 88% on RA, placed on 6L with positive effect. Reports she feels like she has a fever and is concerned that she has pneumonia. Denies chest pain. Quadriplegic.     Flu+ / PNA+      Active Issues / Relevant Events   FULL CODE   A+O x4  Flu+  UA: nitrite +  Tele   Regular Diet   Elevate HOB  8L 30% via TC; 4L NC  Quadriplegic -   Bands 22%  Chest x-ray-->Left lower lobe airspace retrocardiac opacities and layering small amount of pleural fluid. Patchy right basilar airspace opacities       To Do List  VS/A/Neuro q 4  Meds per MAR   Strict I/Os  Telemetry  Prefers a dark room  PRN suctioning of stoma        Anticipatory Guidance / Discharge Planning  Flu+ A /Admit

## 2023-04-04 NOTE — ED Notes (Signed)
Suctioning performed with sterile technique. 9fr suction catheter used. Large amount of mucus suctioned. Pt tolerated procedure well.

## 2023-04-04 NOTE — Progress Notes (Signed)
Hospital Medicine Progress Note   LOS: 2 days  Full Code    24 HOUR/OVERNIGHT EVENTS     One episode in which patient desated to 70s, improved with suctioning. CXR obtained and notable for persistent L>R lower lung opacities.    SUBJECTIVE     Saw patient at bedside this AM. Feeling improved from prior. Was suctioned overnight and says that this helped her tremendously to feel better. Denies any worsening shortness of breath. Denies any new subjective fevers or chills.     OBJECTIVE      Vitals Range In/Out   BP: (91-145)/(60-95)   Temp:  [37.4 C (99.4 F)-38.7 C (101.7 F)]   Temp src: Oral (04/06 2143)  Heart Rate:  [95-105]   Resp:  [18-38]   SpO2:  [86 %-99 %]    Intake/Output Summary (Last 24 hours) at 04/04/2023 0640  Last data filed at 04/03/2023 2218  Gross per 24 hour   Intake 100 ml   Output 800 ml   Net -700 ml        Vitals:    04/04/23 0226   BP: 126/87   Pulse:    Resp: 18   Temp:    Weight:    Height:        PHYSICAL EXAM:  General: reclined in hospital bed, NAD  CV: RRR, normal S1/S2, no MRG appreciated.  Pulmonary: normal WOB but significant crackles in bilateral lung fields.  Abdominal: soft, nontender, nondistended  Extremities: minimal lower extremity edema.  Skin: warm and dry    STUDIES AND RESULTS:  BMP/Electrolytes CBC   Recent Labs     04/04/23  0219 04/03/23  0440 04/02/23  1023 04/02/23  1021   Sodium 143 139  --   --    Potassium 3.2* 3.6  --   --    Chloride 104 102  --   --    CO2 28 22  --   --    UN 7 9  --   --    Creatinine 0.39* 0.48* 0.45*  --    Glucose 99 102*  --   --    Calcium 8.7* 8.5*  --   --    Magnesium 1.9 1.7  --  1.5*   Albumin  --   --   --  3.4*   Phosphorus 3.0 2.1*  --  2.4*    Recent Labs   Lab 04/04/23  0219 04/03/23  0440 04/02/23  0837   WBC 6.4 6.2 3.3*   Hemoglobin 11.2 11.4 12.5   Hematocrit 34 35 39   Platelets 37* 44* 48*   Neut # K/uL  --  5.3 2.7   Lymph # K/uL  --  0.7* 0.5*   Mono # K/uL  --  0.1 0.1     No results for input(s): "INR", "PTT" in the  last 72 hours.    No components found with this basename: "PT"   GI/Liver Function Other   Recent Labs     04/02/23  1021   AST 20   ALT 13   Bilirubin,Total 0.3   Bilirubin,Direct <0.2     No components found with this basename: "ALKPHOS"   Recent Labs   Lab 04/04/23  0219 04/03/23  0440 04/02/23  1021   Potassium 3.2* 3.6  --    Calcium 8.7* 8.5*  --    Phosphorus 3.0 2.1* 2.4*     No results for input(s): "CHOL", "TRIG", "HDL", "LDL", "  LDLC", "NHDLC", "CHHDC" in the last 168 hours.  No results for input(s): "TROP", "CK", "MCKMB", "RI", "BNP", "LD" in the last 168 hours.     Micro:   Aerobic Culture   Date Value Ref Range Status   04/02/2023 .  Preliminary   12/30/2022 Escherichia coli (!)  Final     Comment:     >100,000/ml  Isolate identified by MALDI-TOF  .       Anaerobic Culture   Date Value Ref Range Status   03/20/2020 Anaerobic gram positive cocci (!)  Final     Comment:     1+   03/20/2020 Cutibacterium avidum (!)  Final     Comment:     1+   03/20/2020 Cutibacterium acnes (!)  Final     Comment:     3 colonies  Formerly Propionibacterium acnes       Bacterial Blood Culture   Date Value Ref Range Status   04/02/2023 .  Preliminary   04/02/2023 .  Preliminary         Imaging:   *Chest STANDARD single view    Result Date: 04/02/2023  Left lower lobe airspace retrocardiac opacities and layering small amount of pleural fluid. Patchy right basilar airspace opacities. Findings likely due to infection or aspiration in appropriate clinical scenario. END OF IMPRESSION I have personally reviewed the images and the Resident's/Fellow's interpretation and agree with or edited the findings.       [facility] submits this DICOM format image data and final report to the [facility], an independent secure electronic health information exchange, on a reciprocally searchable basis (with patient authorization) for a minimum of 12 months after exam date.    Scheduled Meds:   doxycycline IV  100 mg Intravenous Q12H     oseltamivir  75 mg Oral BID    cefTRIAXone  2,000 mg Intravenous Q24H    magnesium chloride  64 mg Oral Daily    potassium and sodium phosphate  2 tablet Oral BID    bisacodyl  10 mg Rectal Every Other Day    polyethylene glycol  17 g Oral Daily     Continuous Infusions:  PRN Meds: sodium chloride, dextrose, acetaminophen, carboxymethylcellulose      ASSESSMENT & PLAN     Assessment:   Cindy Ochoa is a 37 y.o. female with PMHx significant for C6 injury with resultant quadriparesis, ileal conduit urostomy, neurogenic bowel, and decubitus ulcer who presented to [facility] with sepsis and AHRF in the setting of influenza A pneumonia. Strep pneumoniae antigen positive.       Plan:    #AHRF in the setting of influenza A pneumonia  #Strep pneumoniae antigen positive  - Strep pneumoniae antigen positive overnight. Febrile to Tmax of 103.   - No WBC count, but bands of 22%  - Rocephin 2g daily x 5 days + doxycycline 100mg  BID x 5 days  - Tamiflu 75mg  BID x 5 days  - Blood cultures NGTD  - MRSA nares negative  - Strep pneumoniae antigen positive. Legionella antigen negative.  - wean oxygen as tolerated    #Chronic UTI with urostomy ileal conduit  - UA positive for nitrites, 1+ leukocyte esterase  - 21-50 WBC, 4+ bacteria  - Urine culture NGTD    #T2DM - diet controlled  - Controlled - continue to monitor.    #Hypophosphatemia  - K-Phos 2 tablets BID    #Hypokalemia   - K-Chlor 40 mEQ PO  F: PO as tolerated  E: no acute concerns, BMP daily.  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 Heron Nay, MD.    Maryjo [place], MD  PGY-1 Anesthesiology  6:40 AM 04/04/2023

## 2023-04-05 DIAGNOSIS — A491 Streptococcal infection, unspecified site: Secondary | ICD-10-CM

## 2023-04-05 LAB — BASIC METABOLIC PANEL
Anion Gap: 11 (ref 7–16)
CO2: 28 mmol/L (ref 20–28)
Calcium: 8.6 mg/dL — ABNORMAL LOW (ref 8.8–10.2)
Chloride: 102 mmol/L (ref 96–108)
Creatinine: 0.4 mg/dL — ABNORMAL LOW (ref 0.51–0.95)
Glucose: 106 mg/dL — ABNORMAL HIGH (ref 60–99)
Lab: 7 mg/dL (ref 6–20)
Potassium: 3.3 mmol/L (ref 3.3–5.1)
Sodium: 141 mmol/L (ref 133–145)
eGFR BY CREAT: 130 *

## 2023-04-05 LAB — CBC
Hematocrit: 34 % (ref 34–49)
Hemoglobin: 10.9 g/dL — ABNORMAL LOW (ref 11.2–16.0)
MCV: 93 fL (ref 75–100)
Platelets: 45 10*3/uL — ABNORMAL LOW (ref 150–450)
RBC: 3.6 MIL/uL — ABNORMAL LOW (ref 4.0–5.5)
RDW: 14.6 % (ref 0.0–15.0)
WBC: 7.7 10*3/uL (ref 3.5–11.0)

## 2023-04-05 LAB — MAGNESIUM: Magnesium: 2 mg/dL (ref 1.6–2.5)

## 2023-04-05 LAB — PHOSPHORUS: Phosphorus: 3.4 mg/dL (ref 2.7–4.5)

## 2023-04-05 MED ORDER — POTASSIUM CHLORIDE CRYS CR 20 MEQ PO TBCR *I*
40.0000 meq | ORAL_TABLET | Freq: Once | ORAL | Status: AC
Start: 2023-04-05 — End: 2023-04-05
  Administered 2023-04-05: 40 meq via ORAL
  Filled 2023-04-05: qty 2

## 2023-04-05 NOTE — Progress Notes (Signed)
Hospital Medicine Progress Note   LOS: 3 days  Full Code    24 HOUR/OVERNIGHT EVENTS     NAEON    SUBJECTIVE     Saw patient at bedside this AM. Feeling improved from prior. Denies any worsening shortness of breath. Denies any new subjective fevers or chills. States she feels she is breathing well off oxygen, and is eager to go home.     OBJECTIVE      Vitals Range In/Out   BP: (140-163)/(88-99)   Temp:  [36.9 C (98.4 F)-37.1 C (98.8 F)]   Temp src: Temporal (04/07 1432)  Heart Rate:  [88-106]   Resp:  [20-27]   SpO2:  [96 %-98 %]    Intake/Output Summary (Last 24 hours) at 04/05/2023 0743  Last data filed at 04/04/2023 1702  Gross per 24 hour   Intake --   Output 1600 ml   Net -1600 ml        Vitals:    04/05/23 0309   BP: (!) 144/91   Pulse: 88   Resp: (!) 27   Temp:    Weight:    Height:        PHYSICAL EXAM:  General: reclined in hospital bed, NAD  CV: RRR, normal S1/S2, no MRG appreciated.  Pulmonary: Noted to be tachypnic 20s-30s; however no increased WOB. Coarse breath sounds bilaterally  Abdominal: soft, nontender, nondistended  Extremities: minimal lower extremity edema.  Skin: warm and dry. No decubitus ulcers.     STUDIES AND RESULTS:  BMP/Electrolytes CBC   Recent Labs     04/05/23  0553 04/04/23  0219 04/03/23  0440 04/02/23  1023 04/02/23  1021   Sodium 141 143 139  --   --    Potassium 3.3 3.2* 3.6  --   --    Chloride 102 104 102  --   --    CO2 28 28 22   --   --    UN 7 7 9   --   --    Creatinine 0.40* 0.39* 0.48*   < >  --    Glucose 106* 99 102*  --   --    Calcium 8.6* 8.7* 8.5*  --   --    Magnesium 2.0 1.9 1.7  --  1.5*   Albumin  --   --   --   --  3.4*   Phosphorus 3.4 3.0 2.1*  --  2.4*    < > = values in this interval not displayed.    Recent Labs   Lab 04/05/23  0553 04/04/23  0219 04/03/23  0440 04/02/23  0837   WBC 7.7 6.4 6.2 3.3*   Hemoglobin 10.9* 11.2 11.4 12.5   Hematocrit 34 34 35 39   Platelets 45* 37* 44* 48*   Neut # K/uL  --  3.9 5.3 2.7   Lymph # K/uL  --  2.0 0.7* 0.5*    Mono # K/uL  --  0.5 0.1 0.1     No results for input(s): "INR", "PTT" in the last 72 hours.    No components found with this basename: "PT"   GI/Liver Function Other   Recent Labs     04/02/23  1021   AST 20   ALT 13   Bilirubin,Total 0.3   Bilirubin,Direct <0.2     No components found with this basename: "ALKPHOS"   Recent Labs   Lab 04/05/23  0553 04/04/23  0219 04/03/23  0440  Potassium 3.3 3.2* 3.6   Calcium 8.6* 8.7* 8.5*   Phosphorus 3.4 3.0 2.1*     No results for input(s): "CHOL", "TRIG", "HDL", "LDL", "LDLC", "NHDLC", "CHHDC" in the last 168 hours.  No results for input(s): "TROP", "CK", "MCKMB", "RI", "BNP", "LD" in the last 168 hours.     Micro:   Aerobic Culture   Date Value Ref Range Status   04/02/2023 Escherichia coli (!)  Preliminary     Comment:      >100,000/ml  Susceptibilities to follow  Isolate identified by MALDI-TOF  .     04/02/2023 Escherichia coli (!)  Preliminary     Comment:      >100,000/ml  Susceptibilities to follow  Isolate identified by MALDI-TOF  .       Anaerobic Culture   Date Value Ref Range Status   03/20/2020 Anaerobic gram positive cocci (!)  Final     Comment:     1+   03/20/2020 Cutibacterium avidum (!)  Final     Comment:     1+   03/20/2020 Cutibacterium acnes (!)  Final     Comment:     3 colonies  Formerly Propionibacterium acnes       Bacterial Blood Culture   Date Value Ref Range Status   04/02/2023 .  Preliminary   04/02/2023 .  Preliminary         Imaging:   *Chest STANDARD single view    Result Date: 04/02/2023  Left lower lobe airspace retrocardiac opacities and layering small amount of pleural fluid. Patchy right basilar airspace opacities. Findings likely due to infection or aspiration in appropriate clinical scenario. END OF IMPRESSION I have personally reviewed the images and the Resident's/Fellow's interpretation and agree with or edited the findings.       [facility] submits this DICOM format image data and final report to the [facility], an independent  secure electronic health information exchange, on a reciprocally searchable basis (with patient authorization) for a minimum of 12 months after exam date.    Scheduled Meds:   doxycycline IV  100 mg Intravenous Q12H    oseltamivir  75 mg Oral BID    cefTRIAXone  2,000 mg Intravenous Q24H    magnesium chloride  64 mg Oral Daily    potassium and sodium phosphate  2 tablet Oral BID    bisacodyl  10 mg Rectal Every Other Day    polyethylene glycol  17 g Oral Daily     Continuous Infusions:  PRN Meds: ondansetron, sodium chloride, dextrose, acetaminophen, carboxymethylcellulose      ASSESSMENT & PLAN     Assessment:   Cindy Ochoa is a 37 y.o. female with PMHx significant for C6 injury with resultant quadriparesis, ileal conduit urostomy, neurogenic bowel, and decubitus ulcer who presented to [facility] with sepsis and AHRF in the setting of influenza A pneumonia. Strep pneumoniae antigen positive.       Plan:    #AHRF in the setting of influenza A pneumonia  #Strep pneumoniae antigen positive  - No WBC count  - bands 6% from  22%  - Rocephin 2g daily x 5 days  - Discontinue doxycycline   - Tamiflu 75mg  BID x 5 days  - Blood cultures NGTD  - MRSA nares negative  - Strep pneumoniae antigen positive. Legionella antigen negative.  - wean oxygen as tolerated    #Chronic UTI with urostomy ileal conduit  - UA positive for nitrites, 1+ leukocyte esterase  -  21-50 WBC, 4+ bacteria  - Urine culture growing E. Coli  - Covering with Ceftriaxone    #T2DM - diet controlled  - Controlled - continue to monitor.    #Hypophosphatemia  - K-Phos 2 tablets BID    #Hypokalemia   - K-Chlor 40 mEQ PO     F: PO as tolerated  E: no acute concerns, BMP daily.  N: PO as tolerated    Bowel Regimen: Miralax daily, bisacodyl suppository every other day  DVT Prophylaxis: holding Fondaparinux given low platelets  PUD Prophylaxis: not currently indicated  Pain Regimen: acetaminophen PRN  CODE STATUS:Full Code    EDD: 04/07/2023  Discharge Planning: pending  clinical improvement        Maryjo [place], MD  PGY-1 Anesthesiology  7:43 AM 04/05/2023

## 2023-04-05 NOTE — Discharge Instructions (Addendum)
Brief Summary of Your Hospital Course (including key procedures and diagnostic test results):  You came to the hospital for shortness of breath, body aches, fever and feeling tired. Your chest x-ray was concerning for pneumonia, and you were found to have Influenza and bacterial pneumonia. You were admitted for treatment of bacterial pneumonia and because you needed oxygen. We started you on antibiotics and Tamiflu, and we started chest PT in order to break up secretions at help your breathing. At this time, we also saw bacteria in your urine, which we treated with the same antibiotics that we used for your pneumonia. You were found to have COVID - we prescribed Paxlovid for discharge. You have improved and no longer require oxygen, and you are medically stable for discharge home. It is important that you follow up with your primary care doctor within two weeks and take your Paxlovid as instructed after discharge.     Your instructions:  - Take Paxlovid twice a day for 5 days  - Follow up with your PCP within two weeks  - Continue chest physiotherapy at home to help clear secretions and help your breathing.     New Medications:  START Paxlovid twice daily for five days (you have more tablets but you only need to use it for 5 more days)    What to do after you leave the hospital:    Recommended diet: Regular - No restrictions     Recommended activity: activity as tolerated    Wound Care: none needed    If you experience any of these symptoms within the first 24 hours after discharge:Chest pain, Shortness of breath, Fever greater than 100.5, Chills, Loss of consciousness, or Weakness please follow up with the discharge attending Dr. Casimer Leek at phone-number: [phone]    If you experience any of these symptoms 24 hours or more after discharge:Chest pain, Shortness of breath, Fever greater than 100.5, Chills, Poor feeding, or Weakness please follow up with your PCP:  Morrie Sheldon [name], DO [phone]

## 2023-04-06 ENCOUNTER — Inpatient Hospital Stay: Payer: Medicaid Other

## 2023-04-06 DIAGNOSIS — J189 Pneumonia, unspecified organism: Secondary | ICD-10-CM

## 2023-04-06 LAB — CBC AND DIFFERENTIAL
Baso # K/uL: 0 10*3/uL (ref 0.0–0.2)
Eos # K/uL: 0.1 10*3/uL (ref 0.0–0.5)
Hematocrit: 35 % (ref 34–49)
Hemoglobin: 11.4 g/dL (ref 11.2–16.0)
Lymph # K/uL: 2.2 10*3/uL (ref 1.0–5.0)
MCV: 92 fL (ref 75–100)
Mono # K/uL: 0.1 10*3/uL (ref 0.1–1.0)
Neut # K/uL: 3.1 10*3/uL (ref 1.5–6.5)
Nucl RBC # K/uL: 0 10*3/uL (ref 0.0–0.0)
Nucl RBC %: 0 /100 WBC (ref 0.0–0.2)
Platelets: 79 10*3/uL — ABNORMAL LOW (ref 150–450)
RBC: 3.8 MIL/uL — ABNORMAL LOW (ref 4.0–5.5)
RDW: 14.2 % (ref 0.0–15.0)
Seg Neut %: 56.6 %
WBC: 5.5 10*3/uL (ref 3.5–11.0)

## 2023-04-06 LAB — DIFF MANUAL: Diff Based On: 115 CELLS

## 2023-04-06 LAB — BASIC METABOLIC PANEL
Anion Gap: 14 (ref 7–16)
CO2: 27 mmol/L (ref 20–28)
Calcium: 9.2 mg/dL (ref 8.8–10.2)
Chloride: 99 mmol/L (ref 96–108)
Creatinine: 0.41 mg/dL — ABNORMAL LOW (ref 0.51–0.95)
Glucose: 110 mg/dL — ABNORMAL HIGH (ref 60–99)
Lab: 7 mg/dL (ref 6–20)
Potassium: 3.5 mmol/L (ref 3.3–5.1)
Sodium: 140 mmol/L (ref 133–145)
eGFR BY CREAT: 129 *

## 2023-04-06 LAB — URINALYSIS WITH MICROSCOPIC
Bacteria,UA: NONE SEEN
Glucose,UA: NEGATIVE
Leuk Esterase,UA: NEGATIVE
Nitrite,UA: NEGATIVE
Protein,UA: NEGATIVE
Specific Gravity,UA: 1.01 (ref 1.002–1.030)
pH,UA: 8 (ref 5.0–8.0)

## 2023-04-06 LAB — PHOSPHORUS: Phosphorus: 2.4 mg/dL — ABNORMAL LOW (ref 2.7–4.5)

## 2023-04-06 LAB — AEROBIC CULTURE: Aerobic Culture: 0

## 2023-04-06 LAB — MAGNESIUM: Magnesium: 1.8 mg/dL (ref 1.6–2.5)

## 2023-04-06 MED ORDER — PIPERACILLIN/TAZOBACTAM 4.5 G IN NS MINI-BAG PLUS 110 ML *I*
4.5000 g | Freq: Three times a day (TID) | INTRAVENOUS | Status: DC
Start: 2023-04-07 — End: 2023-04-08
  Administered 2023-04-07 – 2023-04-08 (×4): 4.5 g via INTRAVENOUS
  Filled 2023-04-06 (×5): qty 110

## 2023-04-06 MED ORDER — VANCOMYCIN HCL IN NACL 750 MG/150 ML IV SOLN *WRAPPED*
750.0000 mg | Freq: Two times a day (BID) | INTRAVENOUS | Status: DC
Start: 2023-04-06 — End: 2023-04-07
  Administered 2023-04-06 – 2023-04-07 (×2): 750 mg via INTRAVENOUS
  Filled 2023-04-06 (×3): qty 150

## 2023-04-06 MED ORDER — PIPERACILLIN/TAZOBACTAM 4.5 G IN NS MINI-BAG PLUS 110 ML *I*
4.5000 g | Freq: Once | INTRAVENOUS | Status: AC
Start: 2023-04-06 — End: 2023-04-06
  Administered 2023-04-06: 4.5 g via INTRAVENOUS
  Filled 2023-04-06: qty 110

## 2023-04-06 MED ORDER — VANCOMYCIN IV - PHARMACIST TO DOSE PLACEHOLDER *I*
Status: DC
Start: 2023-04-06 — End: 2023-04-07

## 2023-04-06 MED ORDER — K PHOS DI & MONO-SOD PHOS MONO 155-852-130 MG PO TABS*I*
2.0000 | ORAL_TABLET | Freq: Two times a day (BID) | ORAL | Status: DC
Start: 2023-04-06 — End: 2023-04-09
  Administered 2023-04-06 – 2023-04-09 (×7): 2 via ORAL
  Filled 2023-04-06 (×7): qty 2

## 2023-04-06 NOTE — Progress Notes (Signed)
Hospital Medicine Progress Note   LOS: 4 days  Full Code    24 HOUR/OVERNIGHT EVENTS     NAEON    SUBJECTIVE     Saw patient at bedside this AM. States that she was able to eat last night and continues to feel improved. Patient denies chest pain, shortness of breath, is off oxygen. States that she is eager to go home.     OBJECTIVE      Vitals Range In/Out   BP: (121-147)/(79-99)   Temp:  [36.2 C (97.2 F)-38.3 C (100.9 F)]   Temp src: Axillary (04/09 0507)  Heart Rate:  [88-115]   Resp:  [18-25]   SpO2:  [91 %-100 %]    Intake/Output Summary (Last 24 hours) at 04/06/2023 0702  Last data filed at 04/06/2023 0559  Gross per 24 hour   Intake 200 ml   Output 1375 ml   Net -1175 ml        Vitals:    04/06/23 0507   BP: 133/90   Pulse: 91   Resp: 18   Temp: 37.3 C (99.1 F)   Weight:    Height:        PHYSICAL EXAM:  General: reclined in hospital bed, NAD  CV: RRR, normal S1/S2, no MRG appreciated.  Pulmonary: Noted to be tachypnic 20s-30s; however no increased WOB. Coarse breath sounds bilaterally  Abdominal: soft, nontender, nondistended  Extremities: minimal lower extremity edema.  Skin: warm and dry. No decubitus ulcers.     STUDIES AND RESULTS:  BMP/Electrolytes CBC   Recent Labs     04/05/23  0553 04/04/23  0219   Sodium 141 143   Potassium 3.3 3.2*   Chloride 102 104   CO2 28 28   UN 7 7   Creatinine 0.40* 0.39*   Glucose 106* 99   Calcium 8.6* 8.7*   Magnesium 2.0 1.9   Phosphorus 3.4 3.0    Recent Labs   Lab 04/05/23  0553 04/04/23  0219 04/03/23  0440 04/02/23  0837   WBC 7.7 6.4 6.2 3.3*   Hemoglobin 10.9* 11.2 11.4 12.5   Hematocrit 34 34 35 39   Platelets 45* 37* 44* 48*   Neut # K/uL  --  3.9 5.3 2.7   Lymph # K/uL  --  2.0 0.7* 0.5*   Mono # K/uL  --  0.5 0.1 0.1     No results for input(s): "INR", "PTT" in the last 72 hours.    No components found with this basename: "PT"   GI/Liver Function Other   No results for input(s): "AST", "ALT", "HTBIL", "TB", "DB" in the last 72 hours.    No components found  with this basename: "ALKPHOS" Recent Labs   Lab 04/05/23  0553 04/04/23  0219 04/03/23  0440   Potassium 3.3 3.2* 3.6   Calcium 8.6* 8.7* 8.5*   Phosphorus 3.4 3.0 2.1*     No results for input(s): "CHOL", "TRIG", "HDL", "LDL", "LDLC", "NHDLC", "CHHDC" in the last 168 hours.  No results for input(s): "TROP", "CK", "MCKMB", "RI", "BNP", "LD" in the last 168 hours.     Micro:   Aerobic Culture   Date Value Ref Range Status   04/02/2023 Escherichia coli (!)  Preliminary     Comment:      >100,000/ml  Isolate identified by MALDI-TOF  .     04/02/2023 Organism isolated - Identification to follow (!)  Preliminary     Comment:     >  100,000/ml  Susceptibilities to follow       Anaerobic Culture   Date Value Ref Range Status   03/20/2020 Anaerobic gram positive cocci (!)  Final     Comment:     1+   03/20/2020 Cutibacterium avidum (!)  Final     Comment:     1+   03/20/2020 Cutibacterium acnes (!)  Final     Comment:     3 colonies  Formerly Propionibacterium acnes       Bacterial Blood Culture   Date Value Ref Range Status   04/02/2023 .  Preliminary   04/02/2023 .  Preliminary         Imaging:   *Chest STANDARD single view    Result Date: 04/02/2023  Left lower lobe airspace retrocardiac opacities and layering small amount of pleural fluid. Patchy right basilar airspace opacities. Findings likely due to infection or aspiration in appropriate clinical scenario. END OF IMPRESSION I have personally reviewed the images and the Resident's/Fellow's interpretation and agree with or edited the findings.       [facility] submits this DICOM format image data and final report to the [facility], an independent secure electronic health information exchange, on a reciprocally searchable basis (with patient authorization) for a minimum of 12 months after exam date.    Scheduled Meds:   oseltamivir  75 mg Oral BID    cefTRIAXone  2,000 mg Intravenous Q24H    magnesium chloride  64 mg Oral Daily    bisacodyl  10 mg Rectal Every Other Day     polyethylene glycol  17 g Oral Daily     Continuous Infusions:  PRN Meds: ondansetron, sodium chloride, dextrose, acetaminophen, carboxymethylcellulose      ASSESSMENT & PLAN     Assessment:   Cindy Ochoa is a 37 y.o. female with PMHx significant for C6 injury with resultant quadriparesis, ileal conduit urostomy, neurogenic bowel, and decubitus ulcer who presented to [facility] with sepsis and AHRF in the setting of influenza A pneumonia. Strep pneumoniae antigen positive.       Plan:    #AHRF in the setting of influenza A pneumonia  #Strep pneumoniae antigen positive  - No WBC count  - bands 6% from  22%  - Rocephin 2g daily x 5 days  - Tamiflu 75mg  BID x 5 days  - Blood cultures NGTD  - MRSA nares negative  - Strep pneumoniae antigen positive. Legionella antigen negative.  - wean oxygen as tolerated    #Chronic UTI with urostomy ileal conduit  - UA positive for nitrites, 1+ leukocyte esterase  - 21-50 WBC, 4+ bacteria  - Urine culture growing E. Coli, sensitivities reviewed.   - Covering with Ceftriaxone inpatient    #T2DM - diet controlled  - Controlled - continue to monitor.    #Hypophosphatemia  - K-Phos 2 tablets BID    #Hypokalemia   - K-Chlor 40 mEQ PO     F: PO as tolerated  E: no acute concerns, BMP daily.  N: PO as tolerated    Bowel Regimen: Miralax daily, bisacodyl suppository every other day  DVT Prophylaxis: holding Fondaparinux given low platelets  PUD Prophylaxis: not currently indicated  Pain Regimen: acetaminophen PRN  CODE STATUS:Full Code    EDD: 04/07/2023  Discharge Planning: PCP appointment, possible discharge today 4/9 afternoon        [name], MD  PGY-1 Anesthesiology  7:02 AM 04/06/2023

## 2023-04-06 NOTE — Progress Notes (Signed)
Vancomycin (initial dosing) - Pharmacist to Dose    Cindy Ochoa is a 36 y.o. female who has been consulted for vancomycin dosing and is being treated for Pneumonia     Goal trough concentration is 15-20 mcg/mL.    SCr:       Lab results: 04/06/23  1431 04/05/23  0553 04/04/23  0219   Creatinine 0.41* 0.40* 0.39*     BUN:       Lab results: 04/06/23  1431 04/05/23  0553 04/04/23  0219   UN 7 7 7        Estimated CrCl:   ml/min    Will start patient at a dose of 750 mg Q12H. Next vancomycin concentration before the 4th or 5th dose, order has not been placed at this time.   Pharmacist will follow for changes in renal function, toxicity, and efficacy and order serum concentrations and creatinine as needed.    For questions contact pharmacist at [phone]    Edilia Bo, PharmD

## 2023-04-06 NOTE — Interdisciplinary Rounds (Signed)
Interdisciplinary Discharge Communication Note    Medically Ready for Discharge Date: 04/06/23   Targeted Discharge Disposition : Home    Rounding was performed on: IDR Date: 04/06/23 at: IDR Time: 1000    Interdisciplinary Rounding Participants: SW, Care Coordinator, Resident    Admit Date/Time:  04/02/2023  7:15 AM     Principal Problem:  <principal problem not specified>  The patient's problem list and interdisciplinary care plan was reviewed.    Expected Date for Discharge: 04/06/2023        Discharge Planning    Targeted Disposition   Targeted Discharge Disposition : Home    Home Care  Home Care, Choiced?: N/A           Medical Equipment Needs         Medical Supplies Available: Not Applicable  Equipment/Supplies Ordered: None                                 Delivery of D/C DME/medical supplies confirmed, if applicable: Not Applicable         Communication  Is communication necessary with patient spokesperson? : No             Current PCP:  Day, [name], DO    Scheduled Future Appointments:  Follow-up Appointments: Follow-up appointment with PCP made     Future Appointments      Wednesday April 14, 2023 8:00 AM  (Arrive by 7:45 AM)  Dexa Scan with RIS, MP TWR [facility] DXA  [facility] Dexa) [phone]     Wednesday April 14, 2023 12:50 PM  Transitional Care Mgmt with [name] Day, DO  [facility] (--) [phone]              Transportation  Transportation Setup: Complete       SW Financial/Voucher Assistance for Transportation: N/A    Prescriptions   Prescriptions sent to outpatient pharmacy: Pending         Alfredo Martinez, RN  2:23 PM

## 2023-04-06 NOTE — Progress Notes (Signed)
04/06/23 0911   [facility] Patient Class Review   Patient Class Review Inpatient     Patient class effective as of 04/02/2023     Janalyn Shy RN  Utilization Management

## 2023-04-07 LAB — PHOSPHORUS: Phosphorus: 3.7 mg/dL (ref 2.7–4.5)

## 2023-04-07 LAB — CBC
Hematocrit: 36 % (ref 34–49)
Hemoglobin: 11.9 g/dL (ref 11.2–16.0)
MCV: 92 fL (ref 75–100)
Platelets: 97 10*3/uL — ABNORMAL LOW (ref 150–450)
RBC: 3.9 MIL/uL — ABNORMAL LOW (ref 4.0–5.5)
RDW: 14.4 % (ref 0.0–15.0)
WBC: 4.5 10*3/uL (ref 3.5–11.0)

## 2023-04-07 LAB — BASIC METABOLIC PANEL
Anion Gap: 15 (ref 7–16)
CO2: 23 mmol/L (ref 20–28)
Calcium: 9 mg/dL (ref 8.8–10.2)
Chloride: 100 mmol/L (ref 96–108)
Creatinine: 0.44 mg/dL — ABNORMAL LOW (ref 0.51–0.95)
Glucose: 118 mg/dL — ABNORMAL HIGH (ref 60–99)
Lab: 8 mg/dL (ref 6–20)
Potassium: 3.6 mmol/L (ref 3.3–5.1)
Sodium: 138 mmol/L (ref 133–145)
eGFR BY CREAT: 127 *

## 2023-04-07 LAB — MRSA NAAT (PCR): MRSA NAAT (PCR): NEGATIVE

## 2023-04-07 LAB — GRAM STAIN

## 2023-04-07 LAB — MAGNESIUM: Magnesium: 1.9 mg/dL (ref 1.6–2.5)

## 2023-04-07 MED ORDER — IPRATROPIUM-ALBUTEROL 0.5-2.5 MG/3ML IN SOLN *I*
3.0000 mL | Freq: Four times a day (QID) | RESPIRATORY_TRACT | Status: DC
Start: 2023-04-07 — End: 2023-04-09
  Administered 2023-04-07 – 2023-04-09 (×8): 3 mL via RESPIRATORY_TRACT
  Filled 2023-04-07 (×8): qty 3

## 2023-04-07 MED ORDER — BISACODYL 10 MG RE SUPP *I*
10.0000 mg | Freq: Every day | RECTAL | Status: DC
Start: 2023-04-07 — End: 2023-04-09
  Administered 2023-04-07: 10 mg via RECTAL

## 2023-04-07 NOTE — Progress Notes (Signed)
Hospital Medicine Progress Note   LOS: 5 days  Full Code    24 HOUR/OVERNIGHT EVENTS     Patient initially planned for discharge 4/9, however began to experience fevers. Tmax 100.9, not improving with Tylenol. Repeat blood cultures and CXR obtained, started Vanc/Zosyn.    SUBJECTIVE     Saw patient at bedside this AM. States that she has decreased appetite since last night but fevers have improved. She reports that her chest feels worse since being suctioned. Denies chest pain/shortness of breath.     OBJECTIVE      Vitals Range In/Out   BP: (112-157)/(77-100)   Temp:  [36.2 C (97.2 F)-38.3 C (100.9 F)]   Temp src: Temporal (04/10 0653)  Heart Rate:  [77-95]   Resp:  [18-20]   SpO2:  [93 %-96 %]    Intake/Output Summary (Last 24 hours) at 04/07/2023 0655  Last data filed at 04/06/2023 1730  Gross per 24 hour   Intake 720 ml   Output 650 ml   Net 70 ml        Vitals:    04/07/23 0653   BP: 125/81   Pulse: 79   Resp:    Temp: 36.6 C (97.9 F)   Weight:    Height:        PHYSICAL EXAM:  General: reclined in hospital bed, NAD  CV: RRR, normal S1/S2, no MRG appreciated.  Pulmonary: Noted to be tachypnic 20s-30s; however no increased WOB. Coarse breath sounds bilaterally  Abdominal: soft, nontender, nondistended  Extremities: minimal lower extremity edema.  Skin: warm and dry. No decubitus ulcers.     STUDIES AND RESULTS:  BMP/Electrolytes CBC   Recent Labs     04/06/23  1431 04/05/23  0553   Sodium 140 141   Potassium 3.5 3.3   Chloride 99 102   CO2 27 28   UN 7 7   Creatinine 0.41* 0.40*   Glucose 110* 106*   Calcium 9.2 8.6*   Magnesium 1.8 2.0   Phosphorus 2.4* 3.4    Recent Labs   Lab 04/06/23  1431 04/05/23  0553 04/04/23  0219 04/03/23  0440 04/02/23  0837   WBC 5.5 7.7 6.4 6.2 3.3*   Hemoglobin 11.4 10.9* 11.2 11.4 12.5   Hematocrit 35 34 34 35 39   Platelets 79* 45* 37* 44* 48*   Neut # K/uL 3.1  --  3.9 5.3 2.7   Lymph # K/uL 2.2  --  2.0 0.7* 0.5*   Mono # K/uL 0.1  --  0.5 0.1 0.1     No results for  input(s): "INR", "PTT" in the last 72 hours.    No components found with this basename: "PT"   GI/Liver Function Other   No results for input(s): "AST", "ALT", "HTBIL", "TB", "DB" in the last 72 hours.    No components found with this basename: "ALKPHOS" Recent Labs   Lab 04/06/23  1431 04/05/23  0553 04/04/23  0219   Potassium 3.5 3.3 3.2*   Calcium 9.2 8.6* 8.7*   Phosphorus 2.4* 3.4 3.0     No results for input(s): "CHOL", "TRIG", "HDL", "LDL", "LDLC", "NHDLC", "CHHDC" in the last 168 hours.  No results for input(s): "TROP", "CK", "MCKMB", "RI", "BNP", "LD" in the last 168 hours.     Micro:   Aerobic Culture   Date Value Ref Range Status   04/06/2023 .  Preliminary   04/06/2023 .  Preliminary     Anaerobic Culture  Date Value Ref Range Status   03/20/2020 Anaerobic gram positive cocci (!)  Final     Comment:     1+   03/20/2020 Cutibacterium avidum (!)  Final     Comment:     1+   03/20/2020 Cutibacterium acnes (!)  Final     Comment:     3 colonies  Formerly Propionibacterium acnes       Bacterial Blood Culture   Date Value Ref Range Status   04/06/2023 .  Preliminary   04/06/2023 .  Preliminary         Imaging:   *Chest STANDARD single view    Result Date: 04/06/2023  No change. Continued airspace opacities in both lower lung fields left greater than right concerning for areas of pneumonia with small bilateral pleural effusions.     END OF IMPRESSION     [facility] submits this DICOM format image data and final report to the [facility], an independent secure electronic health information exchange, on a reciprocally searchable basis (with patient authorization) for a minimum of 12 months after exam date.    Scheduled Meds:   potassium and sodium phosphate  2 tablet Oral BID    piperacillin-tazobactam  4.5 g Intravenous Q8H    vancomycin  750 mg Intravenous Q12H    magnesium chloride  64 mg Oral Daily    bisacodyl  10 mg Rectal Every Other Day    polyethylene glycol  17 g Oral Daily     Continuous Infusions:    Vancomycin - Pharmacist to Dose       PRN Meds: ondansetron, sodium chloride, dextrose, acetaminophen, carboxymethylcellulose      ASSESSMENT & PLAN     Assessment:   Cindy Ochoa is a 37 y.o. female with PMHx significant for C6 injury with resultant quadriparesis, ileal conduit urostomy, neurogenic bowel, and decubitus ulcer who presented to [facility] with sepsis and AHRF in the setting of influenza A pneumonia. Strep pneumoniae antigen positive.       Plan:    #AHRF in the setting of influenza A pneumonia  #Strep pneumoniae antigen positive  Repeat fever following completion of IV antibiotics  - No WBC count, no bands  - Start Vancomycin IV  - Started Zosyn IV  - s/p Rocephin 2g daily x 5 days  - s/p Tamiflu 75mg  BID x 5 days  - Blood cultures NGTD  - Repeat MRSA nares   - Strep pneumoniae antigen positive. Legionella antigen negative.  - Follow sputum culture - gram positive bacilli and gram positive cocci in pairs    #Chronic UTI with urostomy ileal conduit  - UA positive for nitrites, 1+ leukocyte esterase  - 21-50 WBC, 4+ bacteria  - Urine culture growing E. Coli, sensitivities reviewed.   - Covering with Ceftriaxone inpatient  - Repeat culture from 4/9 pending     #T2DM - diet controlled  - Controlled - continue to monitor.    #Hypophosphatemia  - K-Phos 2 tablets BID    #Hypokalemia   - K-Chlor 40 mEQ PO     F: PO as tolerated  E: no acute concerns, BMP daily.  N: PO as tolerated    Bowel Regimen: Miralax daily, bisacodyl suppository daily  DVT Prophylaxis: holding Fondaparinux given low platelets  PUD Prophylaxis: not currently indicated  Pain Regimen: acetaminophen PRN  CODE STATUS:Full Code    EDD: 04/07/2023  Discharge Planning: PCP appointment, possible discharge today 4/9 afternoon  Maryjo [place], MD  PGY-1 Anesthesiology  6:55 AM 04/07/2023

## 2023-04-08 LAB — CBC
Hematocrit: 35 % (ref 34–49)
Hemoglobin: 11.6 g/dL (ref 11.2–16.0)
MCV: 91 fL (ref 75–100)
Platelets: 128 10*3/uL — ABNORMAL LOW (ref 150–450)
RBC: 3.9 MIL/uL — ABNORMAL LOW (ref 4.0–5.5)
RDW: 14.5 % (ref 0.0–15.0)
WBC: 6.6 10*3/uL (ref 3.5–11.0)

## 2023-04-08 LAB — BASIC METABOLIC PANEL
Anion Gap: 10 (ref 7–16)
CO2: 28 mmol/L (ref 20–28)
Calcium: 9.2 mg/dL (ref 8.8–10.2)
Chloride: 100 mmol/L (ref 96–108)
Creatinine: 0.46 mg/dL — ABNORMAL LOW (ref 0.51–0.95)
Glucose: 115 mg/dL — ABNORMAL HIGH (ref 60–99)
Lab: 6 mg/dL (ref 6–20)
Potassium: 3.4 mmol/L (ref 3.3–5.1)
Sodium: 138 mmol/L (ref 133–145)
eGFR BY CREAT: 126 *

## 2023-04-08 LAB — HEPATIC FUNCTION PANEL
ALT: 20 U/L (ref 0–35)
Albumin: 3.6 g/dL (ref 3.5–5.2)
Alk Phos: 40 U/L (ref 35–105)
Bilirubin,Direct: <0.2 mg/dL (ref 0.0–0.3)
Bilirubin,Total: 0.2 mg/dL (ref 0.0–1.2)
Total Protein: 7 g/dL (ref 6.3–7.7)

## 2023-04-08 LAB — COVID/INFLUENZA A & B/RSV NAAT (PCR)
COVID-19 NAAT (PCR): POSITIVE — AB
Influenza A NAAT (PCR): NEGATIVE
Influenza B NAAT (PCR): NEGATIVE
RSV NAAT (PCR): NEGATIVE

## 2023-04-08 LAB — PHOSPHORUS: Phosphorus: 4 mg/dL (ref 2.7–4.5)

## 2023-04-08 LAB — MAGNESIUM: Magnesium: 2 mg/dL (ref 1.6–2.5)

## 2023-04-08 LAB — PERFORMING LAB

## 2023-04-08 MED ORDER — POTASSIUM CHLORIDE CRYS CR 10 MEQ PO TBCR *I*
40.0000 meq | ORAL_TABLET | Freq: Once | ORAL | Status: AC
Start: 2023-04-08 — End: 2023-04-08
  Administered 2023-04-08: 40 meq via ORAL
  Filled 2023-04-08: qty 4

## 2023-04-08 MED ORDER — SODIUM CHLORIDE 0.9 % FLUSH FOR PUMPS *I*
0.0000 mL/h | INTRAVENOUS | Status: DC | PRN
Start: 2023-04-08 — End: 2023-04-09

## 2023-04-08 MED ORDER — FONDAPARINUX SODIUM 2.5 MG/0.5ML SC SOLN *I*
2.5000 mg | Freq: Every day | SUBCUTANEOUS | Status: DC
Start: 2023-04-08 — End: 2023-04-09
  Administered 2023-04-08 – 2023-04-09 (×2): 2.5 mg via SUBCUTANEOUS
  Filled 2023-04-08 (×2): qty 0.5

## 2023-04-08 MED ORDER — DEXTROSE 5 % FLUSH FOR PUMPS *I*
0.0000 mL/h | INTRAVENOUS | Status: DC | PRN
Start: 2023-04-08 — End: 2023-04-09

## 2023-04-08 NOTE — Progress Notes (Signed)
Hospital Medicine Progress Note   LOS: 6 days  Full Code    24 HOUR/OVERNIGHT EVENTS     NAEON. Tmax 100.1 yesterday.    SUBJECTIVE     Saw patient at bedside this AM. Patient reports improvement in her symptoms. States she felt feverish once yesterday but the room was hot. She denies any shortness of breath, states that her appetite is slowly coming back. Discussed with the patient that we'd like to monitor her off antibiotics; she is in agreement with plan.   OBJECTIVE      Vitals Range In/Out   BP: (98-136)/(66-94)   Temp:  [35.9 C (96.7 F)-37.8 C (100.1 F)]   Temp src: Oral (04/11 0556)  Heart Rate:  [87-112]   Resp:  [18-38]   SpO2:  [88 %-98 %]    Intake/Output Summary (Last 24 hours) at 04/08/2023 0724  Last data filed at 04/08/2023 0556  Gross per 24 hour   Intake 1753.47 ml   Output 2725 ml   Net -971.53 ml        Vitals:    04/08/23 0556   BP: 121/76   Pulse: 87   Resp: 18   Temp: 36.6 C (97.9 F)   Weight:    Height:        PHYSICAL EXAM:  General: reclined in hospital bed, NAD  CV: RRR, normal S1/S2, no MRG appreciated.  Pulmonary: Noted to be tachypnic 20s-30s; however no increased WOB. Coarse breath sounds bilaterally  Abdominal: soft, nontender, nondistended  Extremities: minimal lower extremity edema.  Skin: warm and dry. No decubitus ulcers.     STUDIES AND RESULTS:  BMP/Electrolytes CBC   Recent Labs     04/08/23  0212 04/07/23  0644 04/06/23  1431   Sodium 138 138 140   Potassium 3.4 3.6 3.5   Chloride 100 100 99   CO2 28 23 27    UN 6 8 7    Creatinine 0.46* 0.44* 0.41*   Glucose 115* 118* 110*   Calcium 9.2 9.0 9.2   Magnesium 2.0 1.9 1.8   Phosphorus 4.0 3.7 2.4*    Recent Labs   Lab 04/08/23  0212 04/07/23  0644 04/06/23  1431 04/05/23  0553 04/04/23  0219 04/03/23  0440 04/02/23  0837   WBC 6.6 4.5 5.5 7.7 6.4 6.2 3.3*   Hemoglobin 11.6 11.9 11.4 10.9* 11.2 11.4 12.5   Hematocrit 35 36 35 34 34 35 39   Platelets 128* 97* 79* 45* 37* 44* 48*   Neut # K/uL  --   --  3.1  --  3.9 5.3 2.7    Lymph # K/uL  --   --  2.2  --  2.0 0.7* 0.5*   Mono # K/uL  --   --  0.1  --  0.5 0.1 0.1     No results for input(s): "INR", "PTT" in the last 72 hours.    No components found with this basename: "PT"   GI/Liver Function Other   No results for input(s): "AST", "ALT", "HTBIL", "TB", "DB" in the last 72 hours.    No components found with this basename: "ALKPHOS" Recent Labs   Lab 04/08/23  0212 04/07/23  0644 04/06/23  1431   Potassium 3.4 3.6 3.5   Calcium 9.2 9.0 9.2   Phosphorus 4.0 3.7 2.4*     No results for input(s): "CHOL", "TRIG", "HDL", "LDL", "LDLC", "NHDLC", "CHHDC" in the last 168 hours.  No results for input(s): "TROP", "CK", "  MCKMB", "RI", "BNP", "LD" in the last 168 hours.     Micro:   Aerobic Culture   Date Value Ref Range Status   04/06/2023 .  Preliminary   04/06/2023 .  Preliminary     Anaerobic Culture   Date Value Ref Range Status   03/20/2020 Anaerobic gram positive cocci (!)  Final     Comment:     1+   03/20/2020 Cutibacterium avidum (!)  Final     Comment:     1+   03/20/2020 Cutibacterium acnes (!)  Final     Comment:     3 colonies  Formerly Propionibacterium acnes       Bacterial Blood Culture   Date Value Ref Range Status   04/06/2023 .  Preliminary   04/06/2023 .  Preliminary         Imaging:   *Chest STANDARD single view    Result Date: 04/06/2023  No change. Continued airspace opacities in both lower lung fields left greater than right concerning for areas of pneumonia with small bilateral pleural effusions.     END OF IMPRESSION     [facility] submits this DICOM format image data and final report to the [facility], an independent secure electronic health information exchange, on a reciprocally searchable basis (with patient authorization) for a minimum of 12 months after exam date.    Scheduled Meds:   ipratropium-albuterol  3 mL Nebulization 4x Daily    bisacodyl  10 mg Rectal Daily    potassium and sodium phosphate  2 tablet Oral BID    piperacillin-tazobactam  4.5 g  Intravenous Q8H    magnesium chloride  64 mg Oral Daily    polyethylene glycol  17 g Oral Daily     Continuous Infusions:      PRN Meds: ondansetron, sodium chloride, dextrose, acetaminophen, carboxymethylcellulose      ASSESSMENT & PLAN     Assessment:   Cindy Ochoa is a 37 y.o. female with PMHx significant for C6 injury with resultant quadriparesis, ileal conduit urostomy, neurogenic bowel, and decubitus ulcer who presented to [facility] with sepsis and AHRF in the setting of influenza A pneumonia. Strep pneumoniae antigen positive.       Plan:    #AHRF in the setting of influenza A pneumonia  #Strep pneumoniae antigen positive  Repeat fever following completion of IV antibiotics  - No WBC count, no bands  - Discontinued Vancomycin  - Discontinue Zosyn  - s/p Rocephin 2g daily x 5 days  - s/p Tamiflu 75mg  BID x 5 days  - Blood cultures NGTD  - MRSA nares negative  - Strep pneumoniae antigen positive. Legionella antigen negative.  - Follow sputum culture - gram positive bacilli and gram positive cocci in pairs  - Monitor off antibiotics for return of fever/worsening symptoms    #Chronic UTI with urostomy ileal conduit resolved  - Initial UA positive for nitrites, 1+ leukocyte esterase, 21-50 WBC, 4+ bacteria  - Urine culture growing E. Coli, sensitivities reviewed.   - S/p 5 days Ceftriaxone inpatient  - Repeat culture from 4/9 pending, UA clear    #T2DM - diet controlled  - Controlled - continue to monitor.    #Hypophosphatemia  - K-Phos 2 tablets BID    #Hypokalemia   - K-Chlor 40 mEQ PO     F: PO as tolerated  E: no acute concerns, BMP daily.  N: PO as tolerated    Bowel Regimen: Miralax daily, bisacodyl suppository daily  DVT Prophylaxis: restarting Fondaparinux given low platelets  PUD Prophylaxis: not currently indicated  Pain Regimen: acetaminophen PRN  CODE STATUS:Full Code    EDD: 04/07/2023  Discharge Planning: PCP appointment, possible discharge today 4/9 afternoon        Maryjo Marble, MD  PGY-1  Anesthesiology  7:24 AM 04/08/2023

## 2023-04-08 NOTE — Progress Notes (Signed)
Ultrasound Guided Peripheral IV    Indication: difficult access      Procedure Details:  Patient verification with 2 identifiers: Yes  Site: left proximal forearm  Site marked: No  Vessel depth: 0.6cm  Vessel diameter: 0.25        Site prepared with 2% Chlorhexidine. Using no touch technique vessel cannulated with a 22 gauge 1.75" intercath under ultrasound guidance. Evidence of brisk blood return and ease of flush noted. Line secured.    Findings:  Patient did tolerate procedure well.  Number of Attempts:1  Complications: No    Education provided to patient and bedside nurse: Yes    Performed by: Collene Schlichter, RN

## 2023-04-09 ENCOUNTER — Other Ambulatory Visit: Payer: Self-pay

## 2023-04-09 DIAGNOSIS — U071 COVID-19: Secondary | ICD-10-CM

## 2023-04-09 LAB — CBC
Hematocrit: 38 % (ref 34–49)
Hemoglobin: 12.1 g/dL (ref 11.2–16.0)
MCV: 93 fL (ref 75–100)
Platelets: 194 10*3/uL (ref 150–450)
RBC: 4.1 MIL/uL (ref 4.0–5.5)
RDW: 14.8 % (ref 0.0–15.0)
WBC: 7.4 10*3/uL (ref 3.5–11.0)

## 2023-04-09 MED ORDER — NIRMATRELVIR & RITONAVIR 150-100 MG PO TABS *I*
ORAL_TABLET | ORAL | 0 refills | Status: AC
Start: 2023-04-09 — End: 2023-04-14
  Filled 2023-04-09: qty 30, 5d supply, fill #0

## 2023-04-09 NOTE — Progress Notes (Signed)
ID attending/EUA pager    Consult requested by Dr. Lindie Spruce O'Keefe/Dr. Charlyne Petrin    COVID + yesterday    Sx:  3 days    Risk factors for progression: yes    Not on O2.     Imp: COVID+ risk factors for progression, not on O2, apparently within therapeutic window.   No significant med-med interactions paxlovid and her meds.     Plan: recommend paxlovid X 5 days, to be ordered by team.     I spent 12 minutes reviewing chart and discussing with provider.

## 2023-04-09 NOTE — Progress Notes (Signed)
Reviewed 1082 and AVS with patient and mother. No questions or concerns voiced. Verbalized understanding of all. All paperwork signed by mother, and was given a copy.     Patient dressed appropriately for the weather and discharged without issue.     Patient is being discharged with all belongings in possession    Accompanied by: mother  Mode of transportation: wheelchair to car, discharge to home       Signed by: Jarrett Ables, RN as of 04/09/2023 at 4:45 PM

## 2023-04-09 NOTE — Progress Notes (Signed)
Hospital Medicine Progress Note   LOS: 7 days  Full Code    24 HOUR/OVERNIGHT EVENTS     COVID positive. Remains on room air    SUBJECTIVE     Saw patient at bedside this AM. Patient reports that she feels well this morning, has not had any subjective fevers or chills, and her breathing feels okay. Appetite is improved. Patient states she wants to go home at this time.     OBJECTIVE      Vitals Range In/Out   BP: (113-155)/(78-98)   Temp:  [36.3 C (97.3 F)-38 C (100.4 F)]   Temp src: Oral (04/11 2100)  Heart Rate:  [90-99]   Resp:  [18]   SpO2:  [96 %]    Intake/Output Summary (Last 24 hours) at 04/09/2023 0639  Last data filed at 04/09/2023 7829  Gross per 24 hour   Intake 960 ml   Output 1700 ml   Net -740 ml        Vitals:    04/08/23 2100   BP: 119/81   Pulse: 93   Resp: 18   Temp: 37.2 C (98.9 F)   Weight:    Height:        PHYSICAL EXAM:  General: reclined in hospital bed, NAD  CV: RRR, normal S1/S2, no MRG appreciated.  Pulmonary: Noted to be tachypnic 20s-30s; however no increased WOB. Coarse breath sounds bilaterally  Abdominal: soft, nontender, nondistended  Extremities: minimal lower extremity edema.  Skin: warm and dry. No decubitus ulcers.     STUDIES AND RESULTS:  BMP/Electrolytes CBC   Recent Labs     04/08/23  2343 04/08/23  1836 04/08/23  0212 04/07/23  0644   Sodium 141  --  138 138   Potassium 4.1  --  3.4 3.6   Chloride 105  --  100 100   CO2 26  --  28 23   UN 8  --  6 8   Creatinine 0.41*  --  0.46* 0.44*   Glucose 134*  --  115* 118*   Calcium 9.3  --  9.2 9.0   Magnesium 2.0  --  2.0 1.9   Albumin  --  3.6  --   --    Phosphorus 3.8  --  4.0 3.7    Recent Labs   Lab 04/08/23  0212 04/07/23  0644 04/06/23  1431 04/05/23  0553 04/04/23  0219 04/03/23  0440 04/02/23  0837   WBC 6.6 4.5 5.5 7.7 6.4 6.2 3.3*   Hemoglobin 11.6 11.9 11.4 10.9* 11.2 11.4 12.5   Hematocrit 35 36 35 34 34 35 39   Platelets 128* 97* 79* 45* 37* 44* 48*   Neut # K/uL  --   --  3.1  --  3.9 5.3 2.7   Lymph # K/uL   --   --  2.2  --  2.0 0.7* 0.5*   Mono # K/uL  --   --  0.1  --  0.5 0.1 0.1     No results for input(s): "INR", "PTT" in the last 72 hours.    No components found with this basename: "PT"   GI/Liver Function Other   Recent Labs     04/08/23  1836   AST CANCELED   ALT 20   Bilirubin,Total 0.2   Bilirubin,Direct <0.2     No components found with this basename: "ALKPHOS"   Recent Labs   Lab 04/08/23  2343 04/08/23  5621  04/07/23  0644   Potassium 4.1 3.4 3.6   Calcium 9.3 9.2 9.0   Phosphorus 3.8 4.0 3.7     No results for input(s): "CHOL", "TRIG", "HDL", "LDL", "LDLC", "NHDLC", "CHHDC" in the last 168 hours.  No results for input(s): "TROP", "CK", "MCKMB", "RI", "BNP", "LD" in the last 168 hours.     Micro:   Aerobic Culture   Date Value Ref Range Status   04/06/2023 .  Final   04/06/2023 .  Preliminary     Anaerobic Culture   Date Value Ref Range Status   03/20/2020 Anaerobic gram positive cocci (!)  Final     Comment:     1+   03/20/2020 Cutibacterium avidum (!)  Final     Comment:     1+   03/20/2020 Cutibacterium acnes (!)  Final     Comment:     3 colonies  Formerly Propionibacterium acnes       Bacterial Blood Culture   Date Value Ref Range Status   04/06/2023 .  Preliminary   04/06/2023 .  Preliminary         Imaging:   *Chest STANDARD single view    Result Date: 04/06/2023  No change. Continued airspace opacities in both lower lung fields left greater than right concerning for areas of pneumonia with small bilateral pleural effusions.     END OF IMPRESSION     [facility] submits this DICOM format image data and final report to the [facility], an independent secure electronic health information exchange, on a reciprocally searchable basis (with patient authorization) for a minimum of 12 months after exam date.    Scheduled Meds:   fondaparinux  2.5 mg Subcutaneous Daily    ipratropium-albuterol  3 mL Nebulization 4x Daily    bisacodyl  10 mg Rectal Daily    potassium and sodium phosphate  2 tablet Oral BID     polyethylene glycol  17 g Oral Daily     Continuous Infusions:      PRN Meds: sodium chloride, dextrose, ondansetron, sodium chloride, dextrose, acetaminophen, carboxymethylcellulose      ASSESSMENT & PLAN     Assessment:   Cindy Ochoa is a 37 y.o. female with PMHx significant for C6 injury with resultant quadriparesis, ileal conduit urostomy, neurogenic bowel, and decubitus ulcer who presented to [facility] with sepsis and AHRF in the setting of influenza A pneumonia. Strep pneumoniae antigen positive. S/p Ceftriaxone and Zosyn, remains febrile. COVID positive 4/11.      Plan:    #AHRF in the setting of influenza A pneumonia  #Strep pneumoniae antigen positive  #COVID positive   Repeat fever following completion of IV antibiotics  - No WBC count, no bands  - Discontinued Vancomycin  - Discontinue Zosyn  - s/p Rocephin 2g daily x 5 days  - s/p Tamiflu 75mg  BID x 5 days  - Blood cultures NGTD  - MRSA nares negative  - Strep pneumoniae antigen positive. Legionella antigen negative.  - Follow sputum culture - gram positive bacilli and gram positive cocci in pairs  - Monitor off antibiotics for return of fever/worsening symptoms  - ID recommendations for COVID treatment    #Chronic UTI with urostomy ileal conduit resolved  - Initial UA positive for nitrites, 1+ leukocyte esterase, 21-50 WBC, 4+ bacteria  - Urine culture growing E. Coli, sensitivities reviewed.   - S/p 5 days Ceftriaxone inpatient  - Repeat culture from 4/9 pending, UA clear    #T2DM -  diet controlled  - Controlled - continue to monitor.    #Hypophosphatemia  - K-Phos 2 tablets BID    #Hypokalemia   - K-Chlor 40 mEQ PO     F: PO as tolerated  E: no acute concerns, BMP daily.  N: PO as tolerated    Bowel Regimen: Miralax daily, bisacodyl suppository daily  DVT Prophylaxis: restarting Fondaparinux given low platelets  PUD Prophylaxis: not currently indicated  Pain Regimen: acetaminophen PRN  CODE STATUS:Full Code    EDD: 04/07/2023  Discharge Planning: PCP  appointment, possible discharge today 4/9 afternoon        Maryjo St. Rose, MD  PGY-1 Anesthesiology  6:39 AM 04/09/2023

## 2023-04-10 ENCOUNTER — Other Ambulatory Visit: Payer: Self-pay

## 2023-04-12 ENCOUNTER — Other Ambulatory Visit: Payer: Self-pay

## 2023-04-12 NOTE — Discharge Summary (Signed)
Name: Cindy Ochoa MRN: [id number] DOB: [birth year]     Admit Date: 04/02/2023   Date of Discharge: 04/06/2023     Patient was accepted for discharge to   Home or Self Care [1]         Discharge Attending Physician: Shaune Spittle, MD      Hospitalization Summary    CONCISE NARRATIVE: Cindy Ochoa is a 37 y.o. female with PMHx significant for C6 injury with resultant quadriparesis, ileal conduit urostomy, neurogenic bowel, and decubitus ulcer who presented to [facility] with sepsis and acute hypoxic respiratory failure in the setting of influenza A pneumonia. Strep pneumoniae antigen positive. She was treated with Tamiflu and ceftriaxone which was later transitioned to amoxicillin. Oxygen was weaned off and pt was discharged home in stable condition.     Post-Hospitalization To-Do/Follow for PCP:  - Complete course of paxlovid  - Ensure PCP follow up    Follow up labs to be ordered by PCP:  None    New medications at discharge:  - Paxlovid 300-100 mg for 5 more days    Home medication changes at discharge with rationale:  - none    Follow up appointments:      Apr 14 2023   08:00 AM - Dexa Scan  [facility] - RIS, MP TWR ORTHO DXA           May 26 2023   01:00 PM - INJECTION  [facility] - Benita Gutter, MD           Jul 06 2023   07:30 PM - NPSG / WITH COT  [facility] - Sleep Lab Sdc3           Jan 12 2024   01:30 PM - FOLLOW UP VISIT  [facility] - [name], NP             Important radiology findings and follow up:  - none    *Chest STANDARD single view  Result Date: 04/03/2023  Persistent left greater than right lower lung opacities with associated trace right and small left pleural fluid, likely atelectasis and inflammation.     EKG 12 lead  Result Date: 04/03/2023  Sinus tachycardia LAE, consider biatrial enlargement Borderline  T wave abnormalities Poor quality data, interpretation may be affected    *Chest STANDARD single view  Result  Date: 04/02/2023  Left lower lobe airspace retrocardiac opacities and layering small amount of pleural fluid. Patchy right basilar airspace opacities. Findings likely due to infection or aspiration in appropriate clinical scenario.                              Signed: Maurie Boettcher, MD  On: 04/12/2023  at: 9:12 PM

## 2023-04-12 NOTE — Progress Notes (Signed)
Cindy Ochoa is a 37 y.o. female with PMHx significant for C6 injury with resultant quadriparesis, ileal conduit urostomy, neurogenic bowel, and decubitus ulcer who presented to [facility] with sepsis and AHRF in the setting of influenza A pneumonia. Strep pneumoniae antigen positive. S/p Ceftriaxone and Zosyn, remains febrile. COVID positive 4/11.        Plan:     #AHRF in the setting of influenza A pneumonia  #Strep pneumoniae antigen positive  #COVID positive   Repeat fever following completion of IV antibiotics  - No WBC count, no bands  - Discontinued Vancomycin  - Discontinue Zosyn  - s/p Rocephin 2g daily x 5 days  - s/p Tamiflu 75mg  BID x 5 days  - Blood cultures NGTD  - MRSA nares negative  - Strep pneumoniae antigen positive. Legionella antigen negative.  - Follow sputum culture - gram positive bacilli and gram positive cocci in pairs  - Monitor off antibiotics for return of fever/worsening symptoms  - ID recommendations for COVID treatment     #Chronic UTI with urostomy ileal conduit resolved  - Initial UA positive for nitrites, 1+ leukocyte esterase, 21-50 WBC, 4+ bacteria  - Urine culture growing E. Coli, sensitivities reviewed.   - S/p 5 days Ceftriaxone inpatient  - Repeat culture from 4/9 pending, UA clear     #T2DM - diet controlled  - Controlled - continue to monitor.     #Hypophosphatemia  - K-Phos 2 tablets BID     #Hypokalemia   - K-Chlor 40 mEQ PO      F: PO as tolerated  E: no acute concerns, BMP daily.  N: PO as tolerated     Bowel Regimen: Miralax daily, bisacodyl suppository daily  DVT Prophylaxis: restarting Fondaparinux given low platelets  PUD Prophylaxis: not currently indicated  Pain Regimen: acetaminophen PRN  CODE STATUS:Full Code     EDD: 04/07/2023  Discharge Planning: PCP appointment, possible discharge today 4/9 afternoon           Maryjo Waterbury, MD        TCM POST DISCHARGE CONTACT 3 OUTREACH    Care Management Intervention: Care Manager Intervention Completed       Risk of Admission  or ED Visit  Current as of 2 hours ago        86% 40 to 100%: High Risk   20 to < 40%: Medium Risk   0 to < 20%: Low Risk     Last Change:           This score indicates an adult patient's 1-year risk, as a percentage, of a hospital admission or ED visit.             Current PCP:  [name] Day, DO                  Spoke To:: patient      Are you feeling as good as you did when you left the hospital?: Yes         Has home care agency contacted patient yet?: N/A      If equipment was ordered at time of discharge, does the patient have this in the home?: N/A       Is the patient aware of pending orders / testing? : N/A      Do you have any questions about your discharge instructions?: No      Do you have an appointment scheduled with your PCP and know the date and time  of that appointment?: Yes      Do you have transportation to get to that appointment?: Yes      Do you have all of your medications listed on your discharge summary and are you taking them as they are written on the bottle?: Yes         Discharge medications reviewed and reconciled against outpatient MEDICAL RECORD NUMBER: Yes      Do you have any questions or concerns about your medications?: No         If the patient is newly initiated on anticoagulation, are they aware who is managing this?: N/A      Reviewed medications with:: patient      Time spent on the call with the patient:: 5 minutes      Patient Care Management assessment and plan of care/next steps:: Patient reports she is feeling 80% better. She has been given Paxlovid and is taking as directed. She has a follow up visit scheduled at PCP's office on 4/17. She has no questions or concerns at this time.      Patient chart reviewed for care management. No care manager needs identified at this time. Care manager available as needed. Please contact as necessary.

## 2023-04-13 ENCOUNTER — Other Ambulatory Visit: Payer: Medicaid Other

## 2023-04-14 ENCOUNTER — Ambulatory Visit: Payer: Medicaid Other | Admitting: Urology

## 2023-04-14 ENCOUNTER — Encounter: Payer: Self-pay | Admitting: Gastroenterology

## 2023-04-14 ENCOUNTER — Other Ambulatory Visit: Payer: Self-pay

## 2023-04-14 ENCOUNTER — Ambulatory Visit: Payer: Medicaid Other | Attending: Family Medicine | Admitting: Family Medicine

## 2023-04-14 ENCOUNTER — Other Ambulatory Visit: Payer: Medicaid Other

## 2023-04-14 VITALS — HR 91 | Ht 65.0 in | Wt 140.0 lb

## 2023-04-14 DIAGNOSIS — J189 Pneumonia, unspecified organism: Secondary | ICD-10-CM | POA: Insufficient documentation

## 2023-04-14 DIAGNOSIS — U071 COVID-19: Secondary | ICD-10-CM | POA: Insufficient documentation

## 2023-04-14 DIAGNOSIS — Z430 Encounter for attention to tracheostomy: Secondary | ICD-10-CM | POA: Insufficient documentation

## 2023-04-14 DIAGNOSIS — E119 Type 2 diabetes mellitus without complications: Secondary | ICD-10-CM | POA: Insufficient documentation

## 2023-04-14 DIAGNOSIS — J101 Influenza due to other identified influenza virus with other respiratory manifestations: Secondary | ICD-10-CM | POA: Insufficient documentation

## 2023-04-14 LAB — MICROALBUMIN, URINE, RANDOM
Creatinine,UR: 76 mg/dL (ref 20–300)
Microalb/Creat Ratio: 35.4 mg MA/g CR — ABNORMAL HIGH (ref 0.0–29.9)
Microalbumin,UR: 2.69 mg/dL

## 2023-04-14 MED ORDER — TRACHEOSTOMY CARE KIT *A*
PACK | 2 refills | Status: AC
Start: 2023-04-14 — End: ?

## 2023-04-14 MED ORDER — NEBULIZER/TUBING/MOUTHPIECE KIT *A*
PACK | 0 refills | Status: DC
Start: 2023-04-14 — End: 2024-05-16
  Filled 2023-04-14: qty 1, fill #0

## 2023-04-14 MED ORDER — IPRATROPIUM-ALBUTEROL 0.5-2.5 MG/3ML IN SOLN *I*
3.0000 mL | Freq: Four times a day (QID) | RESPIRATORY_TRACT | 0 refills | Status: AC
Start: 2023-04-14 — End: 2023-07-15
  Filled 2023-04-14: qty 1080, 90d supply, fill #0

## 2023-04-14 MED ORDER — METHYLPREDNISOLONE 4 MG PO TBPK *A*
ORAL_TABLET | ORAL | 0 refills | Status: DC
Start: 2023-04-14 — End: 2023-04-23
  Filled 2023-04-14: qty 21, 6d supply, fill #0

## 2023-04-14 NOTE — Patient Instructions (Addendum)
In terms of overall follow-up after recent hospitalization for a episode of acute sepsis suspected secondary to strep pneumonia complicated by influenza A and COVID-19, I recommend the following:    1.  We will obtain a nebulizer machine for your home use and we have ordered nebulizer tubing and medication to be used 3 times a Prabhnoor Ellenberger routinely until cough and breathing quality had improved.    2.  We will consult pulmonology to evaluate for underlying chronic lung disease in the setting of recurrent pneumonia.  They should contact you for scheduling in the next 2 weeks.  If you do not hear from them contact my office.    3.  We have also ordered a tracheostomy care kit that should have tubing and materials to be used with your home suction machine to help with excess secretions from the ostomy.    4.  I recommend contacting [facility] on [address] to see if they could accommodate a sleep study earlier than [facility] sleep medicine, which is currently booking out a couple months in advance.  Please see below for their website information: [email or web address]    5.  Due to continued shortness of breath and rhonchi in the lung fields I am recommending a Medrol Dosepak for the treatment of ongoing increase in sputum production and ongoing shortness of breath.  Take this as recommended with other medication.  Continue Paxlovid.  Contact me for any clinical worsening.    And finally, although your type 2 diabetes is very well-controlled on diet alone, you are due for urine microalbumin which we have obtained here in the office.

## 2023-04-14 NOTE — Addendum Note (Signed)
Addended by: Suzan Garibaldi on: 04/14/2023 03:35 PM     Modules accepted: Orders

## 2023-04-14 NOTE — Discharge Summary (Signed)
Name: Cindy Ochoa MRN: [id number] DOB: July 24, 1986     Admit Date: 04/02/2023   Date of Discharge: 04/06/2023     Patient was accepted for discharge to   Home or Self Care [1]       Discharge Attending Physician: Shaune Spittle, MD      Hospitalization Summary    Concise Narrative: Cindy Ochoa is a 37 y.o. female with PMHx significant for C6 injury with resultant quadriparesis, ileal conduit urostomy, neurogenic bowel, and decubitus ulcer who presented to [facility] with sepsis and acute hypoxic respiratory failure in the setting of influenza A pneumonia. Strep pneumoniae antigen positive. She was treated with Tamiflu and ceftriaxone which was later transitioned to amoxicillin. Oxygen was weaned off and pt was discharged home in stable condition. She fevered with new cough in the hospital and was found to have COVID which was not present on admission.  ID was consulted and she was sent home on paxlovid.    Post-Hospitalization To-Do/Follow for PCP:  - Complete 5 days of paxlovid    Follow up labs to be ordered by PCP:  None    New medications at discharge:  - Paxlovid 300-100 mg for 5 more days    Home medication changes at discharge with rationale:  - none    Follow up appointments:  Future Appointments        Apr 14 2023   08:00 AM - Dexa Scan  [facility] - RIS, MP TWR ORTHO   DXA           May 26 2023   01:00 PM - INJECTION  [facility] - Benita Gutter, MD           Jul 06 2023   07:30 PM - NPSG / WITH COT  [facility] - Sleep Lab Sdc3           Jan 12 2024   01:30 PM - FOLLOW UP VISIT  [facility] - [name], NP             Important radiology findings and follow up:  - none    *Chest STANDARD single view    Result Date: 04/03/2023  Persistent left greater than right lower lung opacities with associated trace right and small left pleural fluid, likely atelectasis and inflammation.   EKG 12 lead    Result Date: 04/03/2023  Sinus  tachycardia LAE, consider biatrial enlargement Borderline  T wave abnormalities Poor quality data, interpretation may be affected    *Chest STANDARD single view    Result Date: 04/02/2023  Left lower lobe airspace retrocardiac opacities and layering small amount of pleural fluid. Patchy right basilar airspace opacities. Findings likely due to infection or aspiration in appropriate clinical scenario. END OF IMPRESSION I have personally reviewed the images and the Resident's/Fellow's interpretation and agree with or edited the findings.       [facility] submits this DICOM format image data and final report to the [facility], an independent secure electronic health information exchange, on a reciprocally searchable basis (with patient authorization) for a minimum of 12 months after exam date.  Signed: Maia Plan, MD  On: 04/14/2023  at: 8:27 AM

## 2023-04-14 NOTE — Progress Notes (Signed)
[facility] Transitional Care Medicine Visit:    HPI:  Date of admission: 04/02/2023  Date of discharge: 04/06/2023    Summary of inpatient care event: Presented to [facility] emergency room with sepsis and acute renal failure in the setting of influenza A pneumonia.  She was also noted to be strep pneumoniae antigen positive and started on ceftriaxone and Zosyn due to suspected sepsis and strep pneumonia coinfection.  She remained febrile following completion of IV antibiotics despite no leukocytosis.  She was subsequently found to be COVID-positive on 04/08/2023 and this secondary infection was felt to be the cause of the persistent fever.  She was also treated for chronic UTI while in the hospital while she was receiving ceftriaxone.  Repeat culture from April 9, Cindy Ochoa of discharge showed no growth.    Interactive telephone encounter:      Yes, performed 04/12/2023           The patient's medications were personally reviewed by myself during today's visit and were updated.  Notable medication changes on discharge: Patient was discharged home on oral Paxlovid.  There were no follow-up labs recommended to be performed by this office.      Interval history since discharge:     Per telephone call 2 days ago, patient Care Management assessment and plan of care/next steps:: Patient reports she is feeling 80% better. She has been given Paxlovid and is taking as directed.  She has no questions or concerns at this time.    Objective:  Pulse 91   Ht 1.651 m (5\' 5" )   Wt 63.5 kg (140 lb)   SpO2 91%   BMI 23.30 kg/m     Vitals reviewed  General: Presents independently in motorized wheelchair.  Patient appears fatigued but breathing comfortably in full sentences.  Pulmonary: Diffuse rhonchi, right greater than left, most notable at the bases.  There are some transmitted upper tracheal sounds most notably around bilateral apices likely emanating from ostomy site.  No egophony, rales, or high-pitched  wheezes appreciated.  Symmetric chest rise.  Cardiac: Regular rate and rhythm S1-S2 no tachycardia appreciated.  Extremities: Spastic paraparesis x 4 extremities.  At baseline.        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    Orders Placed This Encounter   Procedures    *Chest standard frontal and lateral views    Microalbumin, Urine, Random    AMB REFERRAL TO [facility]ULMONOLOGY - [place]        Follow up in about 3 months (around 07/14/2023) for Reassessment.      [name] Cindy Jett, DO    To my patients:  Some of my notes are dictated using voice-recognition program which may result in minor transcription errors.  If you have any urgent concerns, please contact me through MyChart.  Please bring any non-urgent concerns to your next appointment so we can discuss them.  Thank you!

## 2023-04-16 ENCOUNTER — Other Ambulatory Visit: Payer: Self-pay

## 2023-04-19 ENCOUNTER — Encounter: Payer: Self-pay | Admitting: Family Medicine

## 2023-04-21 ENCOUNTER — Encounter: Payer: Self-pay | Admitting: Family Medicine

## 2023-04-21 ENCOUNTER — Emergency Department: Payer: Medicaid Other

## 2023-04-21 ENCOUNTER — Encounter: Payer: Self-pay | Admitting: Student in an Organized Health Care Education/Training Program

## 2023-04-21 ENCOUNTER — Observation Stay
Admission: EM | Admit: 2023-04-21 | Discharge: 2023-04-23 | Disposition: A | Discharge status: Home / Self Care | Payer: Medicaid Other | Source: Ambulatory Visit | Attending: Internal Medicine | Admitting: Internal Medicine

## 2023-04-21 ENCOUNTER — Other Ambulatory Visit: Payer: Self-pay

## 2023-04-21 ENCOUNTER — Ambulatory Visit
Admission: RE | Admit: 2023-04-21 | Discharge: 2023-04-21 | Disposition: A | Discharge status: Home / Self Care | Payer: Medicaid Other | Source: Ambulatory Visit

## 2023-04-21 DIAGNOSIS — Z8701 Personal history of pneumonia (recurrent): Secondary | ICD-10-CM | POA: Insufficient documentation

## 2023-04-21 DIAGNOSIS — R0602 Shortness of breath: Secondary | ICD-10-CM | POA: Insufficient documentation

## 2023-04-21 DIAGNOSIS — R911 Solitary pulmonary nodule: Secondary | ICD-10-CM | POA: Insufficient documentation

## 2023-04-21 DIAGNOSIS — K592 Neurogenic bowel, not elsewhere classified: Secondary | ICD-10-CM | POA: Insufficient documentation

## 2023-04-21 DIAGNOSIS — J9811 Atelectasis: Secondary | ICD-10-CM | POA: Insufficient documentation

## 2023-04-21 DIAGNOSIS — R6520 Severe sepsis without septic shock: Secondary | ICD-10-CM | POA: Insufficient documentation

## 2023-04-21 DIAGNOSIS — Z936 Other artificial openings of urinary tract status: Secondary | ICD-10-CM | POA: Insufficient documentation

## 2023-04-21 DIAGNOSIS — Z8616 Personal history of COVID-19: Secondary | ICD-10-CM | POA: Insufficient documentation

## 2023-04-21 DIAGNOSIS — E119 Type 2 diabetes mellitus without complications: Secondary | ICD-10-CM | POA: Insufficient documentation

## 2023-04-21 DIAGNOSIS — Z20822 Contact with and (suspected) exposure to covid-19: Secondary | ICD-10-CM

## 2023-04-21 DIAGNOSIS — F32A Depression, unspecified: Secondary | ICD-10-CM | POA: Insufficient documentation

## 2023-04-21 DIAGNOSIS — G8254 Quadriplegia, C5-C7 incomplete: Secondary | ICD-10-CM | POA: Insufficient documentation

## 2023-04-21 DIAGNOSIS — R000 Tachycardia, unspecified: Secondary | ICD-10-CM

## 2023-04-21 DIAGNOSIS — Z1152 Encounter for screening for COVID-19: Secondary | ICD-10-CM | POA: Insufficient documentation

## 2023-04-21 DIAGNOSIS — R8271 Bacteriuria: Secondary | ICD-10-CM

## 2023-04-21 DIAGNOSIS — N319 Neuromuscular dysfunction of bladder, unspecified: Secondary | ICD-10-CM | POA: Insufficient documentation

## 2023-04-21 DIAGNOSIS — U071 COVID-19: Secondary | ICD-10-CM

## 2023-04-21 DIAGNOSIS — R0609 Other forms of dyspnea: Secondary | ICD-10-CM | POA: Insufficient documentation

## 2023-04-21 DIAGNOSIS — L02215 Cutaneous abscess of perineum: Secondary | ICD-10-CM

## 2023-04-21 DIAGNOSIS — J101 Influenza due to other identified influenza virus with other respiratory manifestations: Secondary | ICD-10-CM

## 2023-04-21 DIAGNOSIS — E872 Acidosis, unspecified: Secondary | ICD-10-CM | POA: Insufficient documentation

## 2023-04-21 DIAGNOSIS — N764 Abscess of vulva: Principal | ICD-10-CM | POA: Insufficient documentation

## 2023-04-21 DIAGNOSIS — Z87440 Personal history of urinary (tract) infections: Secondary | ICD-10-CM | POA: Insufficient documentation

## 2023-04-21 LAB — RUQ PANEL (ED ONLY)
ALT: 57 U/L — ABNORMAL HIGH (ref 0–35)
Albumin: 3.7 g/dL (ref 3.5–5.2)
Alk Phos: 57 U/L (ref 35–105)
Amylase: 47 U/L (ref 28–100)
Bilirubin,Direct: <0.2 mg/dL (ref 0.0–0.3)
Bilirubin,Total: <0.2 mg/dL (ref 0.0–1.2)
Lipase: 37 U/L (ref 13–60)
Total Protein: 6.8 g/dL (ref 6.3–7.7)

## 2023-04-21 LAB — CBC AND DIFFERENTIAL
Baso # K/uL: 0 10*3/uL (ref 0.0–0.2)
Eos # K/uL: 0 10*3/uL (ref 0.0–0.5)
Hematocrit: 34 % (ref 34–49)
Hemoglobin: 10.8 g/dL — ABNORMAL LOW (ref 11.2–16.0)
IMM Granulocytes #: 0 10*3/uL (ref 0.0–0.0)
IMM Granulocytes: 0.3 %
Lymph # K/uL: 2.6 10*3/uL (ref 1.0–5.0)
MCV: 96 fL (ref 75–100)
Mono # K/uL: 0.6 10*3/uL (ref 0.1–1.0)
Neut # K/uL: 6 10*3/uL (ref 1.5–6.5)
Nucl RBC # K/uL: 0 10*3/uL (ref 0.0–0.0)
Nucl RBC %: 0 /100 WBC (ref 0.0–0.2)
Platelets: 82 10*3/uL — ABNORMAL LOW (ref 150–450)
RBC: 3.6 MIL/uL — ABNORMAL LOW (ref 4.0–5.5)
RDW: 15.1 % — ABNORMAL HIGH (ref 0.0–15.0)
Seg Neut %: 64.7 %
WBC: 9.2 10*3/uL (ref 3.5–11.0)

## 2023-04-21 LAB — BASIC METABOLIC PANEL
Anion Gap: 13 (ref 7–16)
CO2: 19 mmol/L — ABNORMAL LOW (ref 20–28)
Calcium: 9.1 mg/dL (ref 8.8–10.2)
Chloride: 108 mmol/L (ref 96–108)
Creatinine: 0.39 mg/dL — ABNORMAL LOW (ref 0.51–0.95)
Glucose: 151 mg/dL — ABNORMAL HIGH (ref 60–99)
Lab: 14 mg/dL (ref 6–20)
Potassium: 3.9 mmol/L (ref 3.3–5.1)
Sodium: 140 mmol/L (ref 133–145)
eGFR BY CREAT: 131 *

## 2023-04-21 LAB — HOLD GRAY

## 2023-04-21 LAB — NT-PRO BNP: NT-pro BNP: <50 pg/mL (ref 0–450)

## 2023-04-21 MED ORDER — SODIUM CHLORIDE 0.9 % IV BOLUS *I*
1000.0000 mL | Freq: Once | Status: AC
Start: 2023-04-21 — End: 2023-04-22
  Administered 2023-04-21: 1000 mL via INTRAVENOUS

## 2023-04-21 NOTE — ED Triage Notes (Signed)
Pt is a qudraplegic, here two weeks ago with covid/PNA, finished her ABX, endorsing increased secretions coming out of her stoma, SOB, cough.  Received of fluid from EMS.  EKG in triage.    Blood Glucose Meter (mg/dl): 161  IV Solution: Normal saline ( )  Prehospital medications given: Yes  Respiratory/Allergic Reaction: DuoNeb (x3)
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
## 2023-04-22 ENCOUNTER — Emergency Department: Payer: Medicaid Other

## 2023-04-22 ENCOUNTER — Encounter: Payer: Self-pay | Admitting: Internal Medicine

## 2023-04-22 DIAGNOSIS — R0602 Shortness of breath: Secondary | ICD-10-CM | POA: Insufficient documentation

## 2023-04-22 DIAGNOSIS — A419 Sepsis, unspecified organism: Secondary | ICD-10-CM

## 2023-04-22 DIAGNOSIS — L02215 Cutaneous abscess of perineum: Secondary | ICD-10-CM

## 2023-04-22 DIAGNOSIS — R195 Other fecal abnormalities: Secondary | ICD-10-CM

## 2023-04-22 DIAGNOSIS — R6520 Severe sepsis without septic shock: Secondary | ICD-10-CM

## 2023-04-22 DIAGNOSIS — R911 Solitary pulmonary nodule: Secondary | ICD-10-CM

## 2023-04-22 DIAGNOSIS — J9811 Atelectasis: Secondary | ICD-10-CM

## 2023-04-22 HISTORY — DX: Cutaneous abscess of perineum: L02.215

## 2023-04-22 LAB — COVID/INFLUENZA A & B/RSV NAAT (PCR)
COVID-19 NAAT (PCR): NEGATIVE
Influenza A NAAT (PCR): NEGATIVE
Influenza B NAAT (PCR): NEGATIVE
RSV NAAT (PCR): NEGATIVE

## 2023-04-22 LAB — URINALYSIS REFLEX TO CULTURE
Glucose,UA: NEGATIVE
Ketones, UA: NEGATIVE
Leuk Esterase,UA: NEGATIVE
Nitrite,UA: NEGATIVE
Protein,UA: NEGATIVE
Specific Gravity,UA: 1.01 (ref 1.002–1.030)
pH,UA: 6.5 (ref 5.0–8.0)

## 2023-04-22 LAB — URINE MICROSCOPIC (IQ200)
Squam Epithel,UA: NONE SEEN /lpf (ref 0–?)
WBC,UA: >50 /hpf — AB (ref 0–5)

## 2023-04-22 LAB — HOLD GREEN NO GEL

## 2023-04-22 LAB — AEROBIC CULTURE
Aerobic Culture: 0
Aerobic Culture: 0

## 2023-04-22 LAB — GRAM STAIN

## 2023-04-22 LAB — CRP: CRP: 38 mg/L — ABNORMAL HIGH (ref 0–8)

## 2023-04-22 LAB — LACTATE, PLASMA: Lactate: 2.1 mmol/L (ref 0.5–2.2)

## 2023-04-22 LAB — PERFORMING LAB

## 2023-04-22 MED ORDER — MELATONIN 3 MG PO TABS *I*
3.0000 mg | ORAL_TABLET | Freq: Every evening | ORAL | Status: DC | PRN
Start: 2023-04-22 — End: 2023-04-23

## 2023-04-22 MED ORDER — CARBOXYMETHYLCELLULOSE SODIUM 0.5 % OP SOLN WRAPPED *I*
1.0000 [drp] | Freq: Three times a day (TID) | OPHTHALMIC | Status: DC | PRN
Start: 2023-04-22 — End: 2023-04-23

## 2023-04-22 MED ORDER — SENNOSIDES 8.6 MG PO TABS *I*
2.0000 | ORAL_TABLET | Freq: Two times a day (BID) | ORAL | Status: DC
Start: 2023-04-22 — End: 2023-04-23
  Filled 2023-04-22 (×2): qty 2

## 2023-04-22 MED ORDER — VANCOMYCIN IV - PHARMACIST TO DOSE PLACEHOLDER *I*
Status: DC
Start: 2023-04-22 — End: 2023-04-22

## 2023-04-22 MED ORDER — SERTRALINE HCL 50 MG PO TABS *I*
50.0000 mg | ORAL_TABLET | Freq: Every day | ORAL | Status: DC
Start: 2023-04-22 — End: 2023-04-23
  Filled 2023-04-22 (×2): qty 1

## 2023-04-22 MED ORDER — PIPERACILLIN/TAZOBACTAM 4.5 G IN NS MINI-BAG PLUS 110 ML *I*
4.5000 g | Freq: Once | INTRAVENOUS | Status: AC
Start: 2023-04-22 — End: 2023-04-22
  Administered 2023-04-22: 4.5 g via INTRAVENOUS
  Filled 2023-04-22: qty 110

## 2023-04-22 MED ORDER — VANCOMYCIN HCL IN NACL 1000 MG/275 ML IV SOLN *I*
1000.0000 mg | Freq: Three times a day (TID) | INTRAVENOUS | Status: DC
Start: 2023-04-22 — End: 2023-04-22
  Administered 2023-04-22: 1000 mg via INTRAVENOUS
  Filled 2023-04-22: qty 275

## 2023-04-22 MED ORDER — VANCOMYCIN HCL IN NACL 1250 MG/275 ML IV SOLN *I*
1250.0000 mg | Freq: Once | INTRAVENOUS | Status: AC
Start: 2023-04-22 — End: 2023-04-22
  Administered 2023-04-22: 1250 mg via INTRAVENOUS
  Filled 2023-04-22: qty 275

## 2023-04-22 MED ORDER — BISACODYL 10 MG RE SUPP *I*
10.0000 mg | Freq: Every day | RECTAL | Status: DC
Start: 2023-04-22 — End: 2023-04-23
  Filled 2023-04-22 (×2): qty 1

## 2023-04-22 MED ORDER — GUAIFENESIN 600 MG PO TB12 *I*
600.0000 mg | ORAL_TABLET | Freq: Two times a day (BID) | ORAL | Status: DC
Start: 2023-04-22 — End: 2023-04-23
  Administered 2023-04-22 – 2023-04-23 (×3): 600 mg via ORAL
  Filled 2023-04-22 (×3): qty 1

## 2023-04-22 MED ORDER — LACTATED RINGERS IV BOLUS *I*
1000.0000 mL | Freq: Once | INTRAVENOUS | Status: AC
Start: 2023-04-22 — End: 2023-04-22
  Administered 2023-04-22: 1000 mL via INTRAVENOUS

## 2023-04-22 MED ORDER — BISACODYL 10 MG RE SUPP *I*
10.0000 mg | RECTAL | Status: DC
Start: 2023-04-22 — End: 2023-04-22

## 2023-04-22 MED ORDER — ACETAMINOPHEN 500 MG PO TABS *I*
1000.0000 mg | ORAL_TABLET | Freq: Three times a day (TID) | ORAL | Status: DC | PRN
Start: 2023-04-22 — End: 2023-04-23

## 2023-04-22 MED ORDER — DEXTROSE 5 % FLUSH FOR PUMPS *I*
0.0000 mL/h | INTRAVENOUS | Status: DC | PRN
Start: 2023-04-22 — End: 2023-04-23

## 2023-04-22 MED ORDER — CEFTRIAXONE SODIUM 1 G IN STERILE WATER 10ML SYRINGE *I*
1000.0000 mg | Freq: Once | INTRAVENOUS | Status: AC
Start: 2023-04-22 — End: 2023-04-22
  Administered 2023-04-22: 1000 mg via INTRAVENOUS
  Filled 2023-04-22: qty 10

## 2023-04-22 MED ORDER — POLYETHYLENE GLYCOL 3350 PO PACK 17 GM *I*
17.0000 g | PACK | Freq: Two times a day (BID) | ORAL | Status: DC
Start: 2023-04-22 — End: 2023-04-23
  Filled 2023-04-22 (×2): qty 17

## 2023-04-22 MED ORDER — IOHEXOL 350 MG/ML (OMNIPAQUE) IV SOLN 500ML BOTTLE *I*
1.0000 mL | Freq: Once | INTRAVENOUS | Status: AC
Start: 2023-04-22 — End: 2023-04-22
  Administered 2023-04-22: 101 mL via INTRAVENOUS

## 2023-04-22 MED ORDER — CEFTRIAXONE SODIUM 1 G IN STERILE WATER 10ML SYRINGE *I*
1000.0000 mg | INTRAVENOUS | Status: DC
Start: 2023-04-23 — End: 2023-04-22

## 2023-04-22 MED ORDER — AMOXICILLIN-POT CLAVULANATE 875-125 MG PO TABS *I*
1.0000 | ORAL_TABLET | Freq: Two times a day (BID) | ORAL | Status: DC
Start: 2023-04-22 — End: 2023-04-23
  Administered 2023-04-22 – 2023-04-23 (×2): 1 via ORAL
  Filled 2023-04-22 (×2): qty 1

## 2023-04-22 MED ORDER — IPRATROPIUM-ALBUTEROL 0.5-2.5 MG/3ML IN SOLN *I*
3.0000 mL | Freq: Four times a day (QID) | RESPIRATORY_TRACT | Status: DC
Start: 2023-04-22 — End: 2023-04-23
  Administered 2023-04-22 – 2023-04-23 (×5): 3 mL via RESPIRATORY_TRACT
  Filled 2023-04-22 (×5): qty 3

## 2023-04-22 MED ORDER — PIPERACILLIN/TAZOBACTAM 4.5 G IN NS MINI-BAG PLUS 110 ML *I*
4.5000 g | Freq: Three times a day (TID) | INTRAVENOUS | Status: DC
Start: 2023-04-22 — End: 2023-04-22

## 2023-04-22 MED ORDER — SODIUM CHLORIDE 0.9 % FLUSH FOR PUMPS *I*
0.0000 mL/h | INTRAVENOUS | Status: DC | PRN
Start: 2023-04-22 — End: 2023-04-23

## 2023-04-22 NOTE — ED Notes (Signed)
Report Given To  Rahni, RN      Descriptive Sentence / Reason for Admission   Pt is a qudraplegic, here two weeks ago with covid/PNA, finished her ABX, endorsing increased secretions coming out of her stoma, SOB, cough.  Received of fluid from EMS.      Active Issues / Relevant Events   Full code  A&Ox4  Quadriplegic  Urostomy    Tachycardic   Pelvic abscess with purulent drainage  Labs unremarkable  UA + bacteria, WBC, blood  CT abdomen shows LLL consolidation. Right perineal phlegmon or abscess, no intrapelvic collection.      To Do List  VS/A  Meds per [facility]  IV abx      Anticipatory Guidance / Discharge Planning  Admit for perineal abscess

## 2023-04-22 NOTE — H&P (Addendum)
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 ileal conduit urostomy, neurogenic bowel, and decubitus ulcer who presented to [facility] with shortness of breath and a draining perineal wound. Notably, she was discharged from [facility] on 04/09/23 after being treated for PNA, flu, & COVID. Her breathing has been slow to recover since this admission.    Plan:    #Right perineal abscess: actively draining purulent drainage  - CT A/P --> right perineal phlegmon or abscess  - EGS consulted --> cultures collected; defer management to GYN given location of abscess on right labia majoria  - Follow cultures of drainage  - E-consult to GYN order placed (would follow up to discuss patient)  - Continue empiric vanco for SSTI    #Shortness of breath  #Recent history of PNA, Flu, & COVID: treated with CTX, Tamiflu, & Paxlovid; recently prescribed a medrol pack by PCP for ongoing dyspnea  - CTA chest --> no PE; near complete atelectasis of the LLL (improved compared to prior CT scan in 2021)  - Currently testing negative for COVID/Flu/RSV  - No leukocytosis, fevers, or sputum production  - Incentive spirometer & flutter valve ordered  - Continue home Mucinex 600 mg BID  - Continue home Duoneb QID    #Possible UTI: patient has a history of chronic UTIs & ileal conduit urostomy  - UA collected by ED --> >50 WBC & 4+ bacteria  - Follow urine aerobic culture  - Continue CTX as ordered by ED    #Neurogenic bowel:  - CT A/P --> large stool in the colon may represent fecal impaction  - Home bowel regimen:   - Bisacodyl suppository every  other day   - Miralax 17 g daily PRN  - Increase bowel regimen given CT scan report  - Bisacodyl suppository daily  - Miralax 17 g BID  - Senna 2 tabs BID    #T2DM: diet controlled  - Glucose on admission labs --> 151  - Monitor BG on daily labs  - Regular diet ordered    #Depression:  - Continue home sertraline 50 mg daily    Med rec: Completed at bedside with patient; unchanged this last admission  Emergency contact: Leslye Peer (son)  Code status: Full code  DVT prophylaxis: IPCs  Dispo: From home, lives with son; anticipate return home once treated for SSTI    Author: Gennie Alma, NP  Note created: 04/22/2023  at: 4:52 AM

## 2023-04-22 NOTE — ED Notes (Signed)
Report Given To  [place], RN      Descriptive Sentence / Reason for Admission   Pt is a qudraplegic, here two weeks ago with covid/PNA, finished her ABX, endorsing increased secretions coming out of her stoma, SOB, cough.  Received of fluid from EMS.      Active Issues / Relevant Events   Full code  A&Ox4  Quadriplegic  Urostomy    Tachycardic   Pelvic abscess with purulent drainage  Labs unremarkable  UA + bacteria, WBC, blood  CT abdomen shows LLL consolidation. Right perineal phlegmon or abscess, no intrapelvic collection.      To Do List  VS/A  Meds per [facility]  IV abx      Anticipatory Guidance / Discharge Planning  Admit for perineal abscess
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
## 2023-04-22 NOTE — Progress Notes (Signed)
Vancomycin (initial dosing) - Pharmacist to Dose    Vancomycin has been initiated for Skin/Soft Tissue;Empiric Therapy    Vancomycin Monitoring Strategy and Goal: AUC 400-600 mcg*hr/mL (two level)    Estimated CrCl: 177 mL/min  Renal function: Stable  Loading dose ordered: 1250 mg (18.4 mg/kg)  Will start vancomycin 1000 mg Q8H.   Level: Order placed for level on 04/22/23 at 2100 and on 04/23/23 at 0200    Pharmacist will follow clinical course and will order vancomycin concentrations as indicated.    For questions contact pharmacy at extension [phone]     Towanda Malkin, PharmD

## 2023-04-22 NOTE — Progress Notes (Addendum)
04/22/23 1535   [facility] Patient Class Review   Patient Class Review Observation     Patient Class effective 04/22/2023.    Writer reviewed Observation LOC Notification  with Patient on 04/22/23 at the time of 1538 via phone # [phone]. This was a verbal notification & a [facility] RN will be following up to deliver the physical notification form reviewed. A signature of receipt will be required at that time.  If the patient or their representative have additional questions after notification, they have been provided their Utilization Management's contact information at the time of verbal notification & is also included on the written form once delivered.     Truitt Merle RN  [facility] Utilization Management  Emergency Dept [phone]

## 2023-04-22 NOTE — Provider Consult (Signed)
EGS Brief Note:  Cindy Ochoa is a 37 y.o. 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 Labs notable for CRP 38 and Sedimentation rate 65. CT A/P with right perineal phlegmon or abscess. EGS was consulted for the abscess.    Patient is tachycardic to 110s but otherwise hemodynamically stable and afebrile on room air. Patient denies currently having headaches, dizziness, fever, nausea, vomiting, shortness of breath, or chest pain. She denies other illnesses since her last admission and denies exposure to illnesses at home. On exam, abscess is located on the right labia majora and actively draining purulent output. Cultures were obtained. Because abscess is on the right labia majora, would defer management to Gynecology.    Case was discussed with Dr. Fara Chute, EGS chief on call.    Author: Gae Dry My Laveda Norman, DDS as of: 04/22/2023  at: 3:14 AM

## 2023-04-22 NOTE — Clinical Note (Incomplete)
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 minimum of 12 months after exam date.    *Chest STANDARD single view    Result Date: 04/21/2023  04/21/2023 10:33 PM CHEST X-RAY CLINICAL INFORMATION: sob. COMPARISON: None. PROCEDURE: A single frontal projection of the chest was obtained. FINDINGS: Tubes and Catheters: None. Central Airways: Normal. Lungs: Left lower lobe atelectasis. Left pleural thickening. Pleura/Pleural space: Normal. Heart and Mediastinum: Normal. Additional Findings: No acute or aggressive osseous changes noted.     Left lower lobe atelectasis. Left pleural thickening. END OF IMPRESSION       [facility] Imaging submits this DICOM format image data and final report to the [facility], an independent secure electronic health information exchange, on a reciprocally searchable basis (with patient authorization) for a minimum of 12 months after exam date.    *Chest STANDARD single view    Result Date: 04/06/2023  04/06/2023 6:21 PM CHEST X-RAY CLINICAL INFORMATION: Evaluate pneumonia given worsening fever. 04/03/2023 COMPARISON: 04/03/2023 PROCEDURE: A 0 projection of the chest was obtained. FINDINGS: Tubes and Catheters: None. Central Airways: Normal. Lungs: Continued evidence of airspace opacities involving both lower lung fields Pleura/Pleural space: Small bilateral pleural effusion Heart and Mediastinum: Normal. Additional Findings: Post cervical spine surgery     No change. Continued airspace opacities in both lower lung fields left greater than right  concerning for areas of pneumonia with small bilateral pleural effusions. END OF IMPRESSION       [facility] Imaging submits this DICOM format image data and final report to the [facility], an independent secure electronic health information exchange, on a reciprocally searchable basis (with patient authorization) for a minimum of 12 months after exam date.    *Chest STANDARD single view    Result Date: 04/03/2023  04/03/2023 6:18 PM CHEST X-RAY CLINICAL INFORMATION: Increasing o2 requirement. COMPARISON: 04/02/2023. PROCEDURE: A single frontal projection of the chest was obtained. FINDINGS: Tubes and Catheters: None. Central Airways: Normal. Lungs: Persistent left greater than right lower lung opacities. Pleura/Pleural space: Trace right and small left pleural fluid. Heart and Mediastinum: Unchanged Additional Findings: Surgical fixation hardware of the partially visualized cervical spine.     Persistent left greater than right lower lung opacities with associated trace right and small left pleural fluid, likely atelectasis and inflammation. END OF IMPRESSION       UR Imaging submits this DICOM format image data and final report to the [facility], an independent secure electronic health information exchange, on a reciprocally searchable basis (with patient authorization) for a minimum of 12 months after exam date.    EKG 12 lead    Result Date: 04/03/2023  - BORDERLINE ECG -    Sinus tachycardia LAE, consider biatrial enlargement Borderline  T wave abnormalities Poor quality data, interpretation may be affected    *Chest STANDARD single view    Result Date: 04/02/2023  04/02/2023 8:01 AM CHEST X-RAY CLINICAL INFORMATION: History of quadriplegia, worsening dyspnea and fever. COMPARISON: 04/06/2022, CHEST STANDARD SINGLE VIEW PROCEDURE: A single frontal projection of the chest was obtained. FINDINGS: Tubes and Catheters: None. Central Airways: Normal. Lungs/Pleura/Pleural space: Left lower lobe airspace retrocardiac opacities and  layering small amount of pleural fluid. Patchy right basilar airspace opacities. Heart and Mediastinum:  Normal. Additional Findings: No acute or aggressive osseous changes noted.     Left lower lobe airspace retrocardiac opacities and layering small amount of pleural fluid. Patchy right basilar airspace opacities. Findings likely due to infection or aspiration in appropriate clinical scenario. END OF IMPRESSION I have personally reviewed the images and the Resident's/Fellow's interpretation and agree with or edited the findings.       UR Imaging submits this DICOM format image data and final report to the [facility], an independent secure electronic health information exchange, on a reciprocally searchable basis (with patient authorization) for a minimum of 12 months after exam date.        Assessment:  37 y.o. female G1P1001 with ***    Plan:    Ectopic:  1. Given BHCG/Progesterone values above and U/S results above plan for {repeat Quantitative hCG every 48 hours}, {Transvaginal ultrasound when hCG 1,500 or sooner if symptoms of ectopic pregnancy}, {Follow-up appointment with *** on ***}, {establishment of PNC at ***}  2. Ectopic risks include: {findings; risks ectopic pregnancy:14503}  3. Rh {rh type:12795}, Rhogam:  {given/not indicated:14550}  4. Contact phone numbers obtained including ***. {Patient gives permission to leave message containing information regarding pregnancy}  5. {Admit to GYN service with Attending *** and plan for ***}, {Cleared from GYN perspective for discharge home with follow up in *** within  *** days}.  6. Ectopic precautions reviewed.  Warning signs discussed: to call for increased bleeding, abdominal or shoulder pain, light headedness, or if she has any concerns. Instructed her to call {[facility] @ [phone]} {***} or return to the ED if she develops any of these symptoms.  7. Pt understands risk of ectopic pregnancy including severe bleeding and death.  8. Further  care per ED provider       Evaluation for Torsion  1. Given PE findings as documented above; concern for torsion [low] [high]   2. {Admit to GYN service with Attending} *** for {serial abdominal exams} {planned surgery} {pain control} {cleared from GYN perspective for discharge home with follow-up in} {[facility]} {***} {within  *** days}  3. Precautions reviewed including instructions to call {[facility] [phone]} ***  or return to the ED if lightheadedness, dizziness, or increased abdominal pain.     {SAB} {MAB} {IAB} {TAB} {Retained POCs}  1. Given findings on informal/formal u/s above likely this is a case of {SAB} {MAB} {IAB} {TAB} {Retained POCs}  2. Rh {rh type:12795}, Rhogam: {given/not indicated:14550}  3. Options discussed for management including expectant care, medical completion and surgical completion. Pt opts for ***.    4.  {Admit to GYN service with Attending} *** for {serial HCT} {planned surgery} {pain control} {Cleared from GYN perspective for discharge home with follow up in} {[facility]} {***} {within  *** days}  5. Bleeding precautions reviewed including instructions to call [[facility]] [***]  or return to the ED if lightheadedness, dizziness, increased abdominal pain, vaginal bleeding, fever, chills, or vaginal discharge.  6. Further care per ED provider             Vaginal Bleeding  1. {Admit to GYN service with Attending} *** for {serial HCT} {planned surgery} {hormone therapy - megace/aygestin/OCP taper} {Cleared from GYN perspective for discharge home with follow up in} {[facility]} {***} {within  *** days}  2. Bleeding precautions reviewed including instructions to call {[facility] - [phone]} {***}  or return to the ED if lightheadedness, dizziness, increased  abdominal pain, vaginal bleeding, fever, chills, or vaginal discharge.  3. Further care per ED provider    Postpartum Re-admission for Suspected  Pre-Eclampsia  1. Admit to GYN service with Attending ***   2. Strict I/O  3. Blood Pressure: ***  4. Bloodwork: {Repeat HELLP Labs in *** hours} {Straight Cath Urine Spot Pr:Cr to be sent} {***}  5. Magnesium: {Bolus of 4g IV to be started now, followed by infusion of 2g/hr for 24hours} {Not Indicated at this time}    Postoperative {Pain} {Wound Infection}  1. {Admit to GYN service with Attending *** and plan for *** } {Cleared from GYN perspective for discharge home} with plan for follow up in {[facility]} *** within  *** days  2. Precautions reviewed including instructions to call {[facility] [phone]} [***] or return to the ED if fever, chills, nausea, vomiting, chest pain, shortness of breath, vaginal bleeding or discharge, difficulty or pain while urinating, increased pain, or pain not controlled by medications.   3. Further care per ED provider    Pelvic Pain  1. {Admit to GYN service with Attending *** and plan for *** } {Cleared from GYN perspective for discharge home} with plan for follow up in {[facility]} *** within  *** days.   2. Precautions reviewed including instructions to call {[facility] - [phone]} *** or return to the ED if increased pain or pain not controlled by medications, nausea, vomiting, chest pain, shortness of breath, fever, chills, difficulty or pain while urinating.   3. Further care per ED provider    Vaginal Trauma  1. [Admit to GYN service with Attending} *** and plan for *** {Cleared from GYN perspective for Discharge home with plan for follow up in [[facility]} *** within *** days.   2. Precautions reviewed including instructions to call {[facility] [phone]} *** or return to the ED if increased vaginal bleeding, pain or pain not controlled by medications, nausea, vomiting, chest pain, shortness of breath, fever, chills, difficulty or pain while urinating.  3. Further care per ED provider     Viable  Pregnancy  Cleared from GYN perspective for Discharge home with plan for follow up and establishment of Prenatal Care with [facility] or the patient's OB/GYN of choice.  1.  Precautions reviewed including instructions to call {[facility] - [phone]} ***  or return to the ED if increased vaginal bleeding, vaginal discharge or abdominal pain.  2.  Rh {rh type:12795}, Rhogam: {given/not indicated:14550}  3.  Reminded patient to start Prenatal Vitamins, counseled about teratogens.  4.  Further care per ED provider      {Nausea and Vomiting of Pregnancy} {Hyperemesis Gravidarum}  Intrauterine pregnancy at *** weeks gestation by ***  Admit to {GYN service with Attending ***} {Observation Unit}  ATC IV/PO Anti-emetics: {Reglan} {Phenergan} {Zofran} {Compazine} {Scopalamine}  Additional Medications: {Pyroxidine} {Benadryl} {Unisom}  Bowel Regimen: {Colace} {Miralax} {Senna} {Tap Water Enema} {Fleets Enema}  FEN/GI: {IVF} {Clears} {6 Small Meals/Day} {Nutrition Consult} {NPO}    D/W ***

## 2023-04-22 NOTE — Progress Notes (Signed)
Patient seen & chart reviewed with Dr [place] this am.     Home Medications  (Not in a hospital admission)    Scheduled Medications   guaiFENesin  600 mg Oral BID    ipratropium-albuterol  3 mL Nebulization 4x Daily    polyethylene glycol  17 g Oral 2 times per day    sertraline  50 mg Oral Daily    senna  2 tablet Oral 2 times per day    bisacodyl  10 mg Rectal Daily    vancomycin  1,000 mg Intravenous Q8H    piperacillin-tazobactam  4.5 g Intravenous Q8H     PRN Medications  acetaminophen, carboxymethylcellulose, melatonin, sodium chloride, dextrose      Vitals:    04/21/23 2256 04/22/23 0308 04/22/23 0724 04/22/23 0935   BP: 132/76 124/78 (!) 142/93 119/89   BP Location: Right arm Right arm Left arm Right arm   Pulse: 110 (!) 113 (!) 118 (!) 117   Resp: 15 22 21     Temp: 36.9 C (98.4 F) 37.1 C (98.8 F) 37.4 C (99.4 F) 36.7 C (98.1 F)   TempSrc: Oral Oral Oral Temporal   SpO2: 98% 98% 99% 97%   Weight:       Height:           Physical Exam  Vitals reviewed.   Constitutional:       General: She is not in acute distress.     Appearance: She is not ill-appearing, toxic-appearing or diaphoretic.   HENT:      Head: Normocephalic and atraumatic.   Eyes:      General: No scleral icterus.  Pulmonary:      Effort: Pulmonary effort is normal.   Abdominal:      Tenderness: There is no abdominal tenderness.   Musculoskeletal:      Right lower leg: No edema.      Left lower leg: No edema.      Comments: Muscle waiting noted    Skin:     General: Skin is warm.   Neurological:      Mental Status: She is alert and oriented to person, place, and time.      Comments: Speech is clear, able to provide good hx        Labs: reviewed    Recent Labs   Lab 04/21/23  2249   WBC 9.2   Hemoglobin 10.8*   Hematocrit 34   Platelets 82*         Lab results: 04/21/23  2249 04/08/23  2343 04/08/23  0212   Sodium 140 141 138   Potassium 3.9 4.1 3.4   Chloride 108 105 100   CO2 19* 26 28   UN 14 8 6    Creatinine 0.39* 0.41* 0.46*   Glucose  151* 134* 115*   Calcium 9.1 9.3 9.2       Recent Labs   Lab 04/21/23  2249   ALT 57*   AST CANCELED     No components found with this basename: "ALKPHOS", "BILITOT", "BILIDIR", "PROT", "LABALBU"    A/P: Comprehensive plan of care per attending's note-d/w Dr [place]    GYN consult called and will see pt  Changed to Zosyn IV from Ceftriaxone, may be able to d/c Vanco too-pending eval  ? Fecal impaction-Offered enema and Dulcolax PR daily to pt-pt is particular about her BM regimen    Plan of care discussed with CC, SW, bedside nurse and attending  Dispo: pending medical stability, likely home tomorrow-she can have her aids available when she is ready to go     [facility], NP  04/22/2023  11:23 AM

## 2023-04-22 NOTE — Discharge Instructions (Addendum)
Hospital Medicine Discharge Instructions:    Your diagnosis was: labia abscess  Constipation  Ongoing shortness of breath since recent illness     What was done: lab, imaging, given IV antibiotics and increased bowel regimen, seen by surgery and gyn providers and helped to manage labial abscess    What to do after you leave the hospital:    Attend all follow up appointments  Take your medicines as prescribed here, unless changed by another healthcare provider after discharge  Follow up with GYN    Recommended diet after discharge: Regular - No restrictions    Recommended activity after discharge: activity as tolerated    Wound Care Instructions: Warm compresses, warm sitz bath    Medication Changes this Admission: see medication list     Your primary care provider will be responsible for any medication refills after discharge, please contact them if you need refills for your medications.    Lab-work Instructions: You primary doctor will order labs as needed    Who to call:  If you experience any of the following symptoms 24 hours or more after discharge: Uncontrolled pain, Chest pain, Shortness of breath, Fever of 101 F or greater, Chills, Poor appetite, Poor urinary output, Vomiting, Nausea, Diarrhea, Blood in stool or Weakness then Call Dr. Morrie Sheldon, [name], DO      If you experience any of these symptoms within the first 24 hours after discharge call Dr.  [place] at [phone]     What to do if your condition changes:  Once you leave the hospital you should contact your Primary Care Provider's office to make a follow up appointment (we may have already made an appointment for you).  If at any time you have any questions/concerns or if your condition gets worse, contact your healthcare provider.  If you can not reach your healthcare provider or if you develop life threatening symptoms such as trouble breathing or chest pain you should go to the closest Emergency Department.     Education:  You may receive additional  information on your specific condition before you are discharged.  Please ask your nurse or healthcare provider for more information before you leave the hospital.

## 2023-04-23 ENCOUNTER — Other Ambulatory Visit: Payer: Self-pay

## 2023-04-23 ENCOUNTER — Telehealth: Payer: Self-pay | Admitting: Family Medicine

## 2023-04-23 LAB — CBC
Hematocrit: 32 % — ABNORMAL LOW (ref 34–49)
Hemoglobin: 10.5 g/dL — ABNORMAL LOW (ref 11.2–16.0)
MCV: 95 fL (ref 75–100)
Platelets: 101 10*3/uL — ABNORMAL LOW (ref 150–450)
RBC: 3.4 MIL/uL — ABNORMAL LOW (ref 4.0–5.5)
RDW: 15.6 % — ABNORMAL HIGH (ref 0.0–15.0)
WBC: 9.5 10*3/uL (ref 3.5–11.0)

## 2023-04-23 LAB — BASIC METABOLIC PANEL
Anion Gap: 13 (ref 7–16)
CO2: 21 mmol/L (ref 20–28)
Calcium: 9.2 mg/dL (ref 8.8–10.2)
Chloride: 107 mmol/L (ref 96–108)
Creatinine: 0.48 mg/dL — ABNORMAL LOW (ref 0.51–0.95)
Glucose: 174 mg/dL — ABNORMAL HIGH (ref 60–99)
Lab: 12 mg/dL (ref 6–20)
Potassium: 3.6 mmol/L (ref 3.3–5.1)
Sodium: 141 mmol/L (ref 133–145)
eGFR BY CREAT: 124 *

## 2023-04-23 MED ORDER — AMOXICILLIN-POT CLAVULANATE 875-125 MG PO TABS *I*
1.0000 | ORAL_TABLET | Freq: Two times a day (BID) | ORAL | 0 refills | Status: AC
Start: 2023-04-23 — End: 2023-04-27
  Filled 2023-04-23: qty 7, 4d supply, fill #0

## 2023-04-23 MED ORDER — GUAIFENESIN 600 MG PO TB12 *I*
600.0000 mg | ORAL_TABLET | Freq: Two times a day (BID) | ORAL | 0 refills | Status: AC
Start: 2023-04-23 — End: 2023-05-07
  Filled 2023-04-23: qty 28, 14d supply, fill #0

## 2023-04-23 NOTE — ED Notes (Signed)
Report Given To  Brooke, RN      Descriptive Sentence / Reason for Admission   Pt is a qudraplegic, here two weeks ago with covid/PNA, finished her ABX, endorsing increased secretions coming out of her stoma, SOB, cough.  Received of fluid from EMS.      Active Issues / Relevant Events   Full code  A&Ox4  Quadriplegic  Urostomy    Tachycardic   Pelvic abscess with purulent drainage  Labs unremarkable  UA + bacteria, WBC, blood  CT abdomen shows LLL consolidation. Right perineal phlegmon or abscess, no intrapelvic collection.      To Do List  VS/A  Meds per [facility]      Anticipatory Guidance / Discharge Planning  Admit for perineal abscess

## 2023-04-23 NOTE — Telephone Encounter (Signed)
Patient called the office back, she would like it sent to [facility].

## 2023-04-23 NOTE — Progress Notes (Signed)
Noted patient is discharged- Form 2129 mailed to address on file.
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
## 2023-04-23 NOTE — ED Notes (Signed)
Patient discharged home. Mother present with patient's wheelchair and wheelchair Zenaida Niece to take patient back to personal residence. IV removed. Discharge instructions reviewed and patient verbalized understanding.
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
## 2023-04-26 ENCOUNTER — Other Ambulatory Visit: Payer: Self-pay
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
## 2023-05-15 ENCOUNTER — Encounter: Payer: Self-pay | Admitting: Urology

## 2023-05-15 LAB — EKG 12-LEAD
P: 73 deg
PR: 168 ms
QRS: 75 deg
QRSD: 77 ms
QT: 343 ms
QTc: 491 ms
Rate: 123 {beats}/min
T: 75 deg

## 2023-05-22 ENCOUNTER — Encounter: Payer: Self-pay | Admitting: Surgery

## 2023-05-26 ENCOUNTER — Ambulatory Visit
Payer: Medicaid Other | Attending: Physical Medicine and Rehabilitation | Admitting: Physical Medicine and Rehabilitation

## 2023-05-26 ENCOUNTER — Other Ambulatory Visit: Payer: Self-pay

## 2023-05-26 VITALS — BP 97/51 | HR 107 | Temp 97.5°F | Ht 65.0 in | Wt 150.0 lb

## 2023-05-26 DIAGNOSIS — M62838 Other muscle spasm: Secondary | ICD-10-CM | POA: Insufficient documentation

## 2023-05-26 MED ORDER — ONABOTULINUMTOXINA 100 UNIT IJ SOLR *I*
100.0000 [IU] | Freq: Once | INTRAMUSCULAR | Status: AC | PRN
Start: 2023-05-26 — End: 2023-05-26
  Administered 2023-05-26: 100 [IU] via INTRAMUSCULAR

## 2023-05-26 NOTE — Procedures (Signed)
PMR Botox Injections    Performed by: Benita Gutter, MD  Authorized by: Benita Gutter, MD    Date/time: 05/26/2023  1:00 PM EDT  Injections:  [address] - Chemodenervation 1 ext 1-4 muscles and [address] - Chemodenervation 1 ext 1-4 muscles, 1st addtl  Medication:  100 units botulinum toxin type A 100 units; 100 units botulinum toxin type A 100 units; 100 units botulinum toxin type A 100 units; 100 units botulinum toxin type A 100 units; 100 units botulinum toxin type A 100 units; 100 units botulinum toxin type A 100 units   See below     Procedure Details   The risks, benefits, indications, potential complications, and alternatives were explained to the patient and/or guardian who verbalized understanding and informed consent obtained.      The area for injection was identified and a time out called to re-identify.       The correct patient was identified with 2 identifiers The correct procedure, location site(s) and laterality were identified with the patient and/or guardian and correspond to the consent form  The appropriate site was marked and verified.  The patient was placed in the correct position.     After prepping the skin with alcohol overlying the following muscles, botlinum toxin was injected intramuscularly as follows.       Right and left quadriceps (vastus lateralis 75, vastus medialis 75, vastus intermedius 75, rectus femoris 75) 300 + 300 units       Botulinum toxin Lot #: [id number]  Botulinum toxin expiration date: 03/2025  Total botox units injected: 600 units   Total botox units wasted: 0 units       The patient tolerated the procedure without complications.      Patient will follow up in 12 weeks for repeat botox injection.      Benita Gutter, MD

## 2023-05-30 ENCOUNTER — Encounter: Payer: Self-pay | Admitting: Physician Assistant

## 2023-05-30 ENCOUNTER — Other Ambulatory Visit: Payer: Self-pay

## 2023-05-30 ENCOUNTER — Ambulatory Visit: Payer: Medicaid Other | Attending: Physician Assistant | Admitting: Physician Assistant

## 2023-05-30 VITALS — BP 100/66 | HR 86 | Temp 95.8°F | Resp 20

## 2023-05-30 DIAGNOSIS — L0211 Cutaneous abscess of neck: Secondary | ICD-10-CM

## 2023-05-30 MED ORDER — SULFAMETHOXAZOLE-TRIMETHOPRIM 800-160 MG PO TABS *I*
1.0000 | ORAL_TABLET | Freq: Two times a day (BID) | ORAL | 0 refills | Status: AC
Start: 2023-05-30 — End: 2023-06-04

## 2023-05-30 NOTE — Patient Instructions (Signed)
Please start Bactrim twice daily for suspected abscess to your left neck.  Do not pick, scratch, pluck, rub, or traumatically irritate the affected area!  Please continue to apply moist heat (wet washcloth warmed up in microwave) to affected area, 10 minutes on-10 minutes off, to abscess 4-6 times daily.  If your wound/area of incision and drainage continues to become more red, swollen, tender, warm to the touch, or starts to show emergence of new pus-like discharge, your neck armpit starts to swell, you notice proximal red streaking, or you develop high fever/chills, you need to present again to our department or your nearest emergency room for further evaluation and treatment of cellulitis versus refractory infectious abscess.

## 2023-05-30 NOTE — UC Provider Note (Signed)
SUBJECTIVE    CHIEF COMPLAINT:   Chief Complaint   Patient presents with    Abscess     Pt with reoccurring abscess at the base of neck. Does not feel needs to be drained now but very painful. Pain worsened the last 3 days.        HPI:   Cindy Ochoa is a 37 y.o. female who presents for concerns of    Abscess on left side of neck  - Has been there for 3  - Reports redness but no warmth  - Described as  painful; with no drainage  - The abscess is  worsening  - Has tried warm water in the shower which does feel better  -No fevers, no chills no nausea/vomiting, no dizziness  - Has this been seen by another provider? no; already on antibiotics? no  -Patient has underlying quadriparesis, muscle spasticity.      OBJECTIVE    BP 100/66   Pulse 86   Temp 35.4 C (95.8 F)   Resp 20   SpO2 99%     General: well-appearing female, NAD, pleasant.  Sitting in motorized wheelchair.  Head: NCAT  Neck: No cervical lymphadenopathy  Skin: There is an erythematous induration noted to the left side of the neck that is tender upon palpation.  There is no discharge.  There is no fluctuation.    Psych: no anxiety, affect is appropriate      ASSESSMENT & PLAN    Cindy Ochoa is a 37 y.o. female with a PMH of diabetes, nephrolithiasis, quadriparesis neurogenic bladder presenting for abscess of left side of neck for the past 3 days.  Abscess is not amenable to I&D at this time.  Given physical exam, recommended warm compresses and starting oral antibiotics, Bactrim twice daily for 5 days.        Differential: cellulitis, abscess, folliculitis, pimple, abrasion, cyst       1. Neck abscess          Please start Bactrim twice daily for suspected abscess to your left neck.  Do not pick, scratch, pluck, rub, or traumatically irritate the affected area!  Please continue to apply moist heat (wet washcloth warmed up in microwave) to affected area, 10 minutes on-10 minutes off, to abscess 4-6 times daily.  If your wound/area of incision and drainage  continues to become more red, swollen, tender, warm to the touch, or starts to show emergence of new pus-like discharge, your neck armpit starts to swell, you notice proximal red streaking, or you develop high fever/chills, you need to present again to our department or your nearest emergency room for further evaluation and treatment of cellulitis versus refractory infectious abscess.              Patient verbalized understanding of information presented,and confirmed agreement with current plan of care. Reviewed risks, benefits, and administration of prescribed/refilled medications. Precautions reviewed.  Encouraged to contact me / office with any questions or concerns.      Loman Brooklyn, PA

## 2023-06-03 ENCOUNTER — Other Ambulatory Visit: Payer: Self-pay

## 2023-06-03 ENCOUNTER — Encounter: Payer: Self-pay | Admitting: Emergency Medicine

## 2023-06-03 ENCOUNTER — Ambulatory Visit: Payer: Medicaid Other | Attending: Emergency Medicine | Admitting: Emergency Medicine

## 2023-06-03 VITALS — BP 96/60 | HR 103 | Temp 97.0°F | Resp 20

## 2023-06-03 DIAGNOSIS — L0211 Cutaneous abscess of neck: Secondary | ICD-10-CM

## 2023-06-03 LAB — GRAM STAIN

## 2023-06-03 LAB — AEROBIC CULTURE: Aerobic Culture: 0

## 2023-06-03 NOTE — Procedures (Signed)
Incision and drainage    Date/Time: 06/03/2023 1:30 PM    Performed by: Concha Pyo, PA  Authorized by: Concha Pyo, PA    Consent:     Consent obtained:  Verbal    Consent given by:  Patient    Risks discussed:  Pain  Universal protocol:     Procedure explained and questions answered to patient or proxy's satisfaction: yes      Patient identity confirmed:  Verbally with patient  Location:     Type:  Abscess    Location: left neck.  Pre-procedure details:     Skin preparation:  Betadine  Anesthesia (see MAR for exact dosages):     Anesthesia method:  Local infiltration (1% lidocaine)  Procedure type:     Complexity:  Simple  Procedure details:     Incision types:  Single straight    Drainage:  Purulent    Drainage amount:  Copious    Wound treatment:  Wound left open  Post-procedure details:     Patient tolerance of procedure:  Tolerated well, no immediate complications

## 2023-06-03 NOTE — Patient Instructions (Signed)
Continue the Bactrim antibiotic.    Recommend warm compress (heat) application to affected area 3 times daily for 20 minutes.     Recommend otc Tylenol for pain relief. Use as directed on package labeling.     I recommend daily dressing/bandage changes and as needed if dressing becomes soiled. Keep the infected area clean and covered. Monitor for any worsening redness, swelling, discharge, red streaking or fever - these symptoms warrant prompt follow up in the ED.
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
## 2023-06-05 ENCOUNTER — Telehealth: Payer: Self-pay | Admitting: Family Medicine

## 2023-06-05 NOTE — Telephone Encounter (Signed)
Contacted patient back and verified name and date of birth.  Patient states that the area has become less painful and the size of the abscess seems to be getting smaller.  Patient symptoms overall seem to be improving.  She has not noticed any red streaking or fevers.  Will have her continue with the course of Bactrim.  Advise reevaluation by PCP if symptoms should start to worsen again.  Patient verbalized understanding and had no further questions at this time.

## 2023-06-05 NOTE — Telephone Encounter (Signed)
Pt returning missed call please reach back out to patients

## 2023-06-15 ENCOUNTER — Other Ambulatory Visit: Payer: Self-pay | Admitting: Urology

## 2023-06-15 DIAGNOSIS — Z430 Encounter for attention to tracheostomy: Secondary | ICD-10-CM

## 2023-06-15 DIAGNOSIS — J101 Influenza due to other identified influenza virus with other respiratory manifestations: Secondary | ICD-10-CM

## 2023-06-15 DIAGNOSIS — J189 Pneumonia, unspecified organism: Secondary | ICD-10-CM

## 2023-06-15 DIAGNOSIS — U071 COVID-19: Secondary | ICD-10-CM

## 2023-06-15 NOTE — Telephone Encounter (Unsigned)
Copied from CRM #[id number]. Topic: Medications/Prescriptions - Medication Question/Problem  >> Jun 15, 2023 10:58 AM Carilyn Goodpasture wrote:  Clydie Braun - Case Manager [employer] is calling requesting we change pharmacy for the following scripts :  nebulizer device with mask and tubing  ipratropium-albuterol (DUONEB) 0.5-2.5mg  /23mL nebulizer solution  tracheostomy care kit  Patient is not out of supplies but running low  Be sent to [facility] pharmacy instead of [facility]  Best reach back number would be to discuss with patient : [phone] (mobile)

## 2023-06-21 ENCOUNTER — Ambulatory Visit: Payer: Medicaid Other | Admitting: Family Medicine

## 2023-06-25 ENCOUNTER — Telehealth: Payer: Self-pay

## 2023-06-25 NOTE — Progress Notes (Deleted)
[facility] SLEEP DISORDERS [facility]    TEST ORDER FORM    PATIENT:  Cindy Ochoa    MRN:  [id number]      DOB:   09-Sep-1986    STUDY DATE:  ***  FOLLOW-UP DATE:  Visit date not found     STUDY TYPE:  {DESC; SLEEP STUDY TYPES 3:41251}   Room # :     SPECIAL INSTRUCTIONS:  ***    Sleep MD:  {[id number]} *** Referring MD:  ***      PCP: Day, [name], DO    HEIGHT: ***' ***"    WEIGHT:  *** lb   ESS:   *** NECK:  *** "      AHI:  {NA:32542::"n/a"} LOW 02:  {NA:32542::"n/a"} %    [facility] Visit Data / Other Sleep Notes: ***         Insurance Info:   ***  Allergies   Allergen Reactions    Heparin Other (See Comments)     Thrombocytopenia; HIT    Ibuprofen Swelling     Pt reports lip swelling after taking ibuprofen.     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

## 2023-06-25 NOTE — Telephone Encounter (Incomplete)
[facility] SLEEP DISORDERSCENTER        DIRECT INTAKE FORM    Patient Name:  Cindy Ochoa  MRN #:   [id number]  D.O.B.:   1986-11-18  Sex:     female  Height:     Ht Readings from Last 1 Encounters:   05/26/23 1.651 m (5\' 5" )     Weight:     Wt Readings from Last 1 Encounters:   05/26/23 68 kg (150 lb)       ESS:    OBTAIN    Neck Circ:   OBTAIN    Date of Study:    07/06/2023  Study Type:  NPSG    Referring MD:        Haywood Filler, MD   Referring Office/Speciality:  [facility]         PCP:   Hessie Diener, DO     [phone]    Req & Orders Info:  CHART REVIEW > PROCEDURES > SLEEP STUDY    Problem List:   Patient Active Problem List   Diagnosis Code    Muscle spasticity M62.838    Quadriparesis At C6, as a result of trauma in 2006 G82.54    Constipation, chronic K59.09    Depression, unspecified depression type F32.A    Autonomic dysfunction G90.9    Neurogenic bladder disorder N31.9    healed right ischial decubitus ulcer L89.159    History of recurrent UTIs Z87.440    Oculomotor palsy, 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    Nexplanon insertion Z30.017    Headache, menstrual migraine G43.829    Dry eyes H04.123    Neurogenic bowel K59.2    Osteoporosis M81.0    T2DM, no complications, DIET CONTROLLED ALONE E11.9    Recurrent pneumonia J18.9    Influenza A J10.1    Perineal abscess L02.215     Medications:   Current Outpatient Medications on File Prior to Visit   Medication Sig Dispense Refill    tracheostomy care kit Use 3 times per day as instruction, used with suction machine intermittently for excess sputum 1 kit 2    ipratropium-albuterol (DUONEB) 0.5-2.5mg  /79mL nebulizer solution Inhale 3 mLs by nebulization 4 times daily for bronchospasm due to pneumonia. 1080 mL 0    nebulizer device with mask and tubing Use 3 times per day 1 kit 0    carboxymethylcellulose (REFRESH PLUS) 0.5 % ophthalmic solution Place 1 drop into both eyes 3 times daily as needed 70 each 3    Ostomy  Supplies [facility] Coloplast Urostomy two piece bag and wafer 1 1/4 "H  7/8 "V N31.9 Neuromuscular dysfunction of the bladder. Use as directed. 15 each 99    Ostomy Supplies Pouch MISC Coloplast Urostomy two piece bag and wafer 1 1/4 "H  7/8 "V N31.9 Neuromuscular dysfunction of the bladder Use as directed 15 each 11    Ostomy Supplies MISC Coloplast Urostomy two piece bag and wafer 1 1/4 "H  7/8 "V N31.9 Neuromuscular dysfunction of the bladder 14 each 11    Ostomy Supplies MISC Barrier ring. Use as directed. 15 each 11    generic DME Dispense: briefs   ICD-10: N39.41  Duration: Lifetime  Ht Readings from Last 1 Encounters:  04/16/21 : 1.626 m (5\' 4" )   Wt Readings from Last 1 Encounters:  04/16/21 : 61.2 kg (135 lb) 250 each 5    generic DME Dispense: chux pads   ICD-10: N39.41  Duration: Lifetime  Ht Readings from Last 1 Encounters:  04/16/21 : 1.626 m (5\' 4" )   Wt Readings from Last 1 Encounters:  04/16/21 : 61.2 kg (135 lb) 300 each 5    disposable underpads 30"x36" (CHUX) Use 6 times daily and PRN. Dx N39.42  Incontinence without sensory awareness 300 each 99    incontinence supply disposable Large pull ups - use up to 5 x daily  Dx N39.46 150 each 5    sertraline (ZOLOFT) 50 mg tablet Take 1 tablet (50 mg total) by mouth daily 90 tablet 0    polyethylene glycol (GLYCOLAX) powder Take 17 g by mouth daily as needed  Mix in 8 oz water or juice and drink. 255 g 3    bisacodyl (DULCOLAX) 10 mg suppository Place 1 suppository (10 mg total) rectally every other day 24 suppository 2    acetaminophen (MAPAP) 500 mg tablet Take 2 tablets (1,000 mg total) by mouth every 8 hours as needed for Pain or Fever  for pain 180 tablet 5    melatonin 3 mg Take 1 tablet (3 mg total) by mouth nightly as needed for Sleep 30 tablet 0    Non-System Medication Chux pads  Ht: 5\' 5"  Wt: 143lbs  ICD code: N39.41 300 each 5    Non-System Medication Briefs/pullups, large  Ht: 5\' 5"  Wt: 143lbs  ICD code: N39.41 250 each 5    Non-System  Medication Motorized wheelchair-   Lifetime 99  Quad c-6 1 each 0    Non-System Medication Nebulizer with tubing and mask  RO 689  Respiratory insufficiency 1 each 0    Non-System Medication Portable suction  respiratory insufficiency  Tracheotomy   Quadriparesis 1 each 0    generic DME Suction machine with all related supplies. 1 each 0    Non-System Medication Large underpads  Ht: 5\' 5"  Wt: 143lbs  ICD code: N39.41 150 each 5    generic DME 1/2 inch plain packing gauze 30 each 0    generic DME Dispense: ROHO  Indication: Quadriplegia   ICD-10: R53.2  Duration: Lifetime  Ht Readings from Last 1 Encounters:  03/19/20 : 1.626 m (5\' 4" )   Wt Readings from Last 1 Encounters:  03/19/20 : 63.5 kg (140 lb) 1 each 0    Non-System Medication Gloves  Ht: 5\' 5"  Wt: 143lbs  ICD code: N39.41 1 each 5    disposable gloves 1 box Dynarex PF Vinyl Gloves 100 each 5    patient lift Use as directed for patient lifting. For lifetime use; ICD 10: G82.54 Ht: 1.52m Wt: 76.2 kg 1 Device 0    adjustable bath/shower seat with back For lifetime use; ICD 10: G82.54 Ht: 1.75m Wt: 76.2 kg 1 each 0    Non-System Medication Urostomy drainage bags 2000 cc change as needed. Dx N39.46 and  G82.54 4 each 99    generic DME Wafers  Dx code: N31.9  Qty: 10  Refills: 6 1 each 0    generic DME Urostomy Pouches  Dx Codes(s): N31.9  Qty: 20  Refills: 6 1 each 0    generic DME Leg bag  Dx Code: N31.9  Qty: 4  Refills: 6 1 each 0    generic DME Night drainage bag  Dx Code: N31.9  Qty: 4  Refills: 6 1 each 0    generic DME Barrier rings  Dx Code: N31.9  Qty: 15  Refills: 6 1 each 0    generic DME Repairs to  hospital bed/hoyer lift  ICD 10: G82.54   Ht: 1.47m Wt: 76.2 kg 1 each 0    disposable gloves 2 boxes Disposable Medium size gloves N39.46 200 each 99    generic DME Urostomy drainage bags 2000 cc change as needed. Dx N39.46 and  G82.54 4 each 99    Gauze Pads & Dressings (ABDOMINAL PAD) 8"X10" PADS By 1 each no specified route daily   Cover buttock wounds  2x daily 60 each 6    Non-System Medication Gel overlay mattress for hospital bed - diagnosis G82.54, L89.93, L89.159 1 each 0    Non-System Medication Easy Tip Leg Bags 1000mg  - diagnosis G82.53 N39.46 21 each 4    etonogestrel (IMPLANON, NEXPLANON) 68 MG IMPL Inject 68 mg into the skin once. Placed 07/09/21       No current facility-administered medications on file prior to visit.     Allergies:   Allergies   Allergen Reactions    Heparin Other (See Comments)     Thrombocytopenia; HIT    Ibuprofen Swelling     Pt reports lip swelling after taking ibuprofen.     Nitrofurantoin Nausea And Vomiting       {language:26124}  [address]  [place] [address]    Reason for Sleep Referral:   *** and {Sleep apnea:13258}    Usual Sleep/RiseTimes:   ***  / ***     Average sleep latency: ***    Are you excessively sleep during the day? {YES/NO:21037}    Snore: {Responses; yes/no/unknown:74}  Loudness of snoring: ***  Observed apnea: {YES/NO:21037}    Awakenings/night: ***    Use bathroom: ***    Naps: {NAPS; FREQUENCY:30274:s}    Use supplemental Oxygen:  {yes no:16019492::"No"}    Tech Comments / Any Special Needs:  {SCLN SLEEP SPECIAL INSTRUCTIONS:32614}  Do you use cane, walker or wheelchair to get about?    WHEELCHAIR, HOYER NEEDED TO GET INTO BED  Do you sleep elevated with wedge or pillow?   {yes no:16019492::"No"}    Will you be driving yourself to sleep study?   {yes no:16019492::"No"}   If Yes - Will  you be taking any sedating medications for test?  {yes no:16019492::"No"}   If No - Who is transporting? Family/Friend or particular [employer]? ***    Would you like to be on a Standby list?:  {yes no:16019492::"No"}    History Call Date = 06/25/2023    By:  Burgess Estelle

## 2023-06-29 ENCOUNTER — Encounter: Payer: Self-pay | Admitting: Family Medicine

## 2023-06-29 ENCOUNTER — Telehealth: Payer: Self-pay

## 2023-06-29 NOTE — Telephone Encounter (Signed)
Sleep study ordered by Dr Alberteen Sam ENT

## 2023-06-29 NOTE — Telephone Encounter (Unsigned)
Copied from CRM [id number]. Topic: Appointments - Appointment Information  >> Jun 29, 2023 11:29 AM Jacqulynn Cadet wrote:  Patient states that she only gets about 4 hours of sleep a night if that, inquiring if she should request sleeping medication from her provider when she has the test done on 7/9    Patient contact is [phone].

## 2023-07-14 ENCOUNTER — Ambulatory Visit: Payer: Medicaid Other | Admitting: Family Medicine

## 2023-07-19 ENCOUNTER — Encounter: Payer: Self-pay | Admitting: Urology

## 2023-07-28 ENCOUNTER — Encounter: Payer: Self-pay | Admitting: Gastroenterology

## 2023-07-28 ENCOUNTER — Ambulatory Visit: Payer: Medicaid Other | Attending: Family Medicine | Admitting: Family Medicine

## 2023-07-28 ENCOUNTER — Other Ambulatory Visit: Payer: Self-pay

## 2023-07-28 ENCOUNTER — Encounter: Payer: Self-pay | Admitting: Family Medicine

## 2023-07-28 VITALS — HR 96 | Ht 65.0 in

## 2023-07-28 DIAGNOSIS — K592 Neurogenic bowel, not elsewhere classified: Secondary | ICD-10-CM | POA: Insufficient documentation

## 2023-07-28 DIAGNOSIS — E119 Type 2 diabetes mellitus without complications: Secondary | ICD-10-CM | POA: Insufficient documentation

## 2023-07-28 LAB — POCT HEMOGLOBIN A1C: Hemoglobin A1C,POC: 6.5 % — ABNORMAL HIGH

## 2023-07-28 MED ORDER — BISACODYL 10 MG RE SUPP *I*
10.0000 mg | RECTAL | 2 refills | Status: DC
Start: 2023-07-28 — End: 2024-05-16
  Filled 2023-07-28: qty 24, 48d supply, fill #0
  Filled 2023-09-08: qty 24, 48d supply, fill #1
  Filled 2023-11-02: qty 24, 48d supply, fill #2

## 2023-07-28 MED ORDER — METHYLCELLULOSE (LAXATIVE) 500 MG PO TABS *A*
1.0000 g | ORAL_TABLET | Freq: Every day | ORAL | 4 refills | Status: DC
Start: 2023-07-28 — End: 2024-05-16
  Filled 2023-07-28 – 2023-08-11 (×3): qty 180, 90d supply, fill #0

## 2023-07-28 MED ORDER — DOCUSATE SODIUM 100 MG PO CAPS *I*
100.0000 mg | ORAL_CAPSULE | Freq: Two times a day (BID) | ORAL | 4 refills | Status: DC
Start: 2023-07-28 — End: 2024-05-16
  Filled 2023-07-28: qty 180, 90d supply, fill #0
  Filled 2023-11-02: qty 180, 90d supply, fill #1
  Filled 2024-01-24: qty 180, 90d supply, fill #2

## 2023-07-28 MED ORDER — SENNOSIDES-DOCUSATE SODIUM 8.6-50 MG PO TABS *I*
2.0000 | ORAL_TABLET | Freq: Every day | ORAL | 4 refills | Status: DC
Start: 2023-07-28 — End: 2024-05-16
  Filled 2023-07-28: qty 180, 90d supply, fill #0
  Filled 2023-11-02: qty 180, 90d supply, fill #1
  Filled 2024-01-24: qty 180, 90d supply, fill #2

## 2023-07-28 NOTE — Assessment & Plan Note (Signed)
HbA1c,POC was last checked on 07/28/2023 and was 6.5% which is at goal of <7. Per our records the patient is UTD on diabetic eye exam. Patient is UTD on diabetic foot exam. Patient is UTD on microalbuminuria screening.         Controlled on diet alone, no medications indicated today

## 2023-07-28 NOTE — Assessment & Plan Note (Signed)
Will refer Cindy Ochoa to gastroenterology given history of neurogenic bowel and increasing difficulty.  Will optimize medical regimen to treat neurogenic bowel with the following interventions:    1.  Will continue Dulcolax suppositories every other [name].  2.  Will continue Colace 100 mg every morning and afternoon.  3.  Will initiate Peri-Colace 2 tablets before bed to help with motility.  4.  Will add methylcellulose 1 g daily to help with bulking of the stool and fluid balance

## 2023-07-28 NOTE — Progress Notes (Addendum)
[facility] Outpatient Progress Note:    Subjective:    Cindy Ochoa is a 37 y.o. female presenting for Other (Personal care forms)      HPI:    Cindy Ochoa is here to review personal care forms and chronic medical conditions today.   Cindy Ochoa is a 37yo F who has a PMH significant for diet-controlled type 2 diabetes, quadriparesis at C6 with resultant trauma in 2006 coming Cindy Ochoa with resultant quadriparesis and neurogenic bladder and bowel.  Cindy Ochoa also suffers from a history of chronic thrombocytopenia muscle spasticity recurrent pneumonia and autonomic dysfunction, thus facilitating the need for ambulation with a motorized scooter as Cindy Ochoa is completely unable to self propel with use of manual scooter due to spastic paralysis.    Cindy Ochoa does have some concerns today.  "Cindy Ochoa think Cindy Ochoa need someone to check my colon.  Cindy Ochoa can go 2 weeks without a BM despite dulcolax suppository, daily colace, doesn't take miralax because it doesn't help.  Cindy Ochoa has to routinely perform disimpaction or enemas and Cindy Ochoa thinks it's time to talk to a GI specialist about what Cindy Ochoa can do"    ROS: As stated above    Objective:  Pulse 96   Ht 1.651 m (5\' 5" )   SpO2 100%   BMI 24.96 kg/m     Vitals, Medical/Surgical Histories, and Recent Care Notes reviewed  Presents independently in motorized wheelchair.  Patient appears pleasant, breathing comfortably, no acute distress, accompanied by Cindy Ochoa Cindy Ochoa today.    Pulmonary: Clear to auscultation bilaterally, no rales rhonchi or wheezes appreciated.  Symmetric chest rise.  Cardiac: Regular rate and rhythm S1-S2 no tachycardia appreciated.  Extremities: Spastic paraparesis x 4 extremities.  At baseline.      Assessment/Plan:    Problem List Items Addressed This Visit          High    Neurogenic bowel (Chronic)      Will refer Cindy Ochoa to gastroenterology given history of neurogenic bowel and increasing difficulty.  Will optimize medical regimen to treat neurogenic bowel with the following interventions:    1.  Will  continue Dulcolax suppositories every other Cindy Ochoa.  2.  Will continue Colace 100 mg every morning and afternoon.  3.  Will initiate Peri-Colace 2 tablets before bed to help with motility.  4.  Will add methylcellulose 1 g daily to help with bulking of the stool and fluid balance         Relevant Medications    bisacodyl (DULCOLAX) 10 mg suppository    senna-docusate (PERICOLACE) 8.6-50 mg per tablet    docusate sodium (COLACE) 100 mg capsule    methylcellulose (CITRUCEL) 500 mg TABS tablet    Other Relevant Orders    [facility] REFERRAL [facility]    T2DM, no complications, DIET CONTROLLED ALONE - Primary      HbA1c,POC was last checked on 07/28/2023 and was 6.5% which is at goal of <7. Per our records the patient is UTD on diabetic eye exam. Patient is UTD on diabetic foot exam. Patient is UTD on microalbuminuria screening.         Controlled on diet alone, no medications indicated today           Relevant Orders    POCT Hemoglobin A1C                   Follow up: No follow-ups on file.      [name] Piera Downs, DO  [facility] Clinical Faculty, Department  of Family Medicine  [facility]  [address] [place], [place] [address]  Phone: [phone]  Fax: [phone]        To my patients:  Some of my notes are dictated using voice-recognition program which may result in minor transcription errors.  If you have any urgent concerns, please contact me through MyChart.  Please bring any non-urgent concerns to your next appointment so we can discuss them.  Thank you!

## 2023-07-28 NOTE — Patient Instructions (Addendum)
You have been referred to [facility].  Please call [phone] to schedule your appointment.    Dear Cindy Ochoa:    Below is a list of some of the medical concerns we addressed during today's visit:    Problem List Items Addressed This Visit          High    Neurogenic bowel (Chronic)      Will refer [name] to gastroenterology given history of neurogenic bowel and increasing difficulty.  Will optimize medical regimen to treat neurogenic bowel with the following interventions:    1.  Will continue Dulcolax suppositories every other [name].  2.  Will continue Colace 100 mg every morning and afternoon.  3.  Will initiate Peri-Colace 2 tablets before bed to help with motility.  4.  Will add methylcellulose 1 g daily to help with bulking of the stool and fluid balance         Relevant Medications    bisacodyl (DULCOLAX) 10 mg suppository    senna-docusate (PERICOLACE) 8.6-50 mg per tablet    docusate sodium (COLACE) 100 mg capsule    methylcellulose (CITRUCEL) 500 mg TABS tablet    Other Relevant Orders    AMB REFERRAL [facility]    T2DM, no complications, DIET CONTROLLED ALONE - Primary      HbA1c,POC was last checked on 07/28/2023 and was 6.5% which is at goal of <7. Per our records the patient is UTD on diabetic eye exam. Patient is UTD on diabetic foot exam. Patient is UTD on microalbuminuria screening.         Controlled on diet alone, no medications indicated today           Relevant Orders    POCT Hemoglobin A1C

## 2023-07-29 ENCOUNTER — Other Ambulatory Visit: Payer: Self-pay

## 2023-07-30 ENCOUNTER — Other Ambulatory Visit: Payer: Self-pay

## 2023-08-02 ENCOUNTER — Telehealth: Payer: Self-pay | Admitting: Family Medicine

## 2023-08-02 NOTE — Telephone Encounter (Signed)
Deb calling from insurance. They are extending patient's home care until 09/27/2023 and will follow up with a letter in the mail afterward.

## 2023-08-09 ENCOUNTER — Other Ambulatory Visit: Payer: Self-pay

## 2023-08-11 ENCOUNTER — Other Ambulatory Visit: Payer: Self-pay

## 2023-08-19 ENCOUNTER — Ambulatory Visit: Payer: Medicaid Other | Admitting: Obstetrics and Gynecology

## 2023-08-21 ENCOUNTER — Telehealth: Payer: Medicaid Other | Admitting: Family Medicine

## 2023-08-21 DIAGNOSIS — L02419 Cutaneous abscess of limb, unspecified: Secondary | ICD-10-CM

## 2023-08-23 ENCOUNTER — Other Ambulatory Visit: Payer: Self-pay

## 2023-08-23 ENCOUNTER — Other Ambulatory Visit: Payer: Medicaid Other

## 2023-08-23 DIAGNOSIS — L02419 Cutaneous abscess of limb, unspecified: Secondary | ICD-10-CM

## 2023-08-23 MED ORDER — DOXYCYCLINE HYCLATE 100 MG PO TABS *I*
100.0000 mg | ORAL_TABLET | Freq: Two times a day (BID) | ORAL | 0 refills | Status: DC
Start: 2023-08-23 — End: 2023-08-29
  Filled 2023-08-23: qty 10, 5d supply, fill #0

## 2023-08-23 MED ORDER — DOXYCYCLINE HYCLATE 100 MG PO TABS *I*
100.0000 mg | ORAL_TABLET | Freq: Two times a day (BID) | ORAL | 0 refills | Status: DC
Start: 2023-08-23 — End: 2023-08-23

## 2023-08-23 NOTE — Progress Notes (Signed)
Please see the MyChart message reply to our thread subject: "Need antibiotic" for my assessment and plan.    This patient gave consent for this Medical Advice Message and is aware that it may result in a bill to their insurance, as well as the possibility of receiving a bill for a copay and/or deductible.  They are an established patient, but are not seeing information exclusively about a problem treated during an in-person or video visit in the last seven days.    I spent a total of 10 minutes on 08/23/2023 reviewing the patient's prior medical records and current request for medical advice, prescribing medications or ordering tests (if applicable), replying to the patient, and documenting the encounter.    Reviewed medical record, reviewed patient transmitted photographs, strongly suspect that the patient would benefit from a short course of doxycycline what appears to be exacerbation of axillary folliculitis.  Oral doxycycline [facility]  She should start immediately, 1 pill twice a Marti Acebo, if any worsening of pain or swelling she should seek in person evaluation.  Please notify patient via MyChart or telephone.

## 2023-08-24 ENCOUNTER — Other Ambulatory Visit: Payer: Self-pay

## 2023-09-01 ENCOUNTER — Ambulatory Visit
Payer: Medicaid Other | Attending: Physical Medicine and Rehabilitation | Admitting: Physical Medicine and Rehabilitation

## 2023-09-01 ENCOUNTER — Other Ambulatory Visit: Payer: Self-pay

## 2023-09-01 VITALS — BP 114/77 | HR 106 | Temp 98.1°F | Ht 65.0 in | Wt 150.0 lb

## 2023-09-01 DIAGNOSIS — M62838 Other muscle spasm: Secondary | ICD-10-CM | POA: Insufficient documentation

## 2023-09-01 MED ORDER — ONABOTULINUMTOXINA 100 UNIT IJ SOLR *I*
100.0000 [IU] | Freq: Once | INTRAMUSCULAR | Status: AC | PRN
Start: 2023-09-01 — End: 2023-09-01
  Administered 2023-09-01: 100 [IU] via INTRAMUSCULAR

## 2023-09-01 NOTE — Procedures (Signed)
PMR Botox Injections    Performed by: Benita Gutter, MD  Authorized by: Benita Gutter, MD    Date/time: 09/01/2023  1:30 PM EDT  Injections:  43329 - Chemodenervation 1 ext 1-4 muscles and 51884 - Chemodenervation 1 ext 1-4 muscles, 1st addtl  Medication:  100 units botulinum toxin type A 100 units; 100 units botulinum toxin type A 100 units; 100 units botulinum toxin type A 100 units; 100 units botulinum toxin type A 100 units; 100 units botulinum toxin type A 100 units; 100 units botulinum toxin type A 100 units   See below     Procedure Details   The risks, benefits, indications, potential complications, and alternatives were explained to the patient and/or guardian who verbalized understanding and informed consent obtained.      The area for injection was identified and a time out called to re-identify.       The correct patient was identified with 2 identifiers The correct procedure, location site(s) and laterality were identified with the patient and/or guardian and correspond to the consent form  The appropriate site was marked and verified.  The patient was placed in the correct position.     After prepping the skin with alcohol overlying the following muscles, botlinum toxin was injected intramuscularly as follows.       Right and left quadriceps (vastus lateralis 75, vastus medialis 75, vastus intermedius 75, rectus femoris 75) 300 + 300 units       Botulinum toxin Lot #: [id number]  Botulinum toxin expiration date: 06/2025  Total botox units injected: 600 units   Total botox units wasted: 0 units       The patient tolerated the procedure without complications.      Patient will follow up in 12 weeks for repeat botox injection.      Benita Gutter, MD

## 2023-09-02 ENCOUNTER — Encounter: Payer: Self-pay | Admitting: Gastroenterology

## 2023-09-08 ENCOUNTER — Other Ambulatory Visit: Payer: Self-pay

## 2023-09-09 ENCOUNTER — Encounter: Payer: Self-pay | Admitting: Urology

## 2023-09-09 DIAGNOSIS — N390 Urinary tract infection, site not specified: Secondary | ICD-10-CM

## 2023-09-10 ENCOUNTER — Other Ambulatory Visit
Admission: RE | Admit: 2023-09-10 | Discharge: 2023-09-10 | Disposition: A | Discharge status: Home / Self Care | Payer: Medicaid Other | Source: Ambulatory Visit

## 2023-09-10 DIAGNOSIS — N390 Urinary tract infection, site not specified: Secondary | ICD-10-CM | POA: Insufficient documentation

## 2023-09-10 LAB — URINALYSIS WITH MICROSCOPIC
Glucose,UA: NEGATIVE
Hyaline Casts,UA: >20 /lpf — AB (ref 0–5)
Ketones, UA: NEGATIVE
Nitrite,UA: POSITIVE — AB
Specific Gravity,UA: 1.015 (ref 1.002–1.030)
WBC,UA: >50 /hpf — AB (ref 0–5)
pH,UA: 6.5 (ref 5.0–8.0)

## 2023-09-11 ENCOUNTER — Encounter: Payer: Self-pay | Admitting: Student in an Organized Health Care Education/Training Program

## 2023-09-11 ENCOUNTER — Emergency Department: Payer: Medicaid Other

## 2023-09-11 ENCOUNTER — Other Ambulatory Visit: Payer: Self-pay

## 2023-09-11 ENCOUNTER — Inpatient Hospital Stay
Admission: EM | Admit: 2023-09-11 | Discharge: 2023-09-13 | DRG: 720 | Disposition: A | Discharge status: Home / Self Care | Payer: Medicaid Other | Source: Ambulatory Visit | Attending: Student in an Organized Health Care Education/Training Program | Admitting: Student in an Organized Health Care Education/Training Program

## 2023-09-11 DIAGNOSIS — K592 Neurogenic bowel, not elsewhere classified: Secondary | ICD-10-CM | POA: Diagnosis present

## 2023-09-11 DIAGNOSIS — F32A Depression, unspecified: Secondary | ICD-10-CM | POA: Diagnosis present

## 2023-09-11 DIAGNOSIS — N319 Neuromuscular dysfunction of bladder, unspecified: Secondary | ICD-10-CM | POA: Diagnosis present

## 2023-09-11 DIAGNOSIS — R059 Cough, unspecified: Secondary | ICD-10-CM

## 2023-09-11 DIAGNOSIS — L89152 Pressure ulcer of sacral region, stage 2: Secondary | ICD-10-CM | POA: Diagnosis present

## 2023-09-11 DIAGNOSIS — R9431 Abnormal electrocardiogram [ECG] [EKG]: Secondary | ICD-10-CM

## 2023-09-11 DIAGNOSIS — R079 Chest pain, unspecified: Secondary | ICD-10-CM

## 2023-09-11 DIAGNOSIS — J189 Pneumonia, unspecified organism: Secondary | ICD-10-CM

## 2023-09-11 DIAGNOSIS — A4151 Sepsis due to Escherichia coli [E. coli]: Principal | ICD-10-CM | POA: Diagnosis present

## 2023-09-11 DIAGNOSIS — N390 Urinary tract infection, site not specified: Secondary | ICD-10-CM | POA: Diagnosis present

## 2023-09-11 DIAGNOSIS — R000 Tachycardia, unspecified: Secondary | ICD-10-CM

## 2023-09-11 DIAGNOSIS — L89221 Pressure ulcer of left hip, stage 1: Secondary | ICD-10-CM | POA: Diagnosis present

## 2023-09-11 DIAGNOSIS — R6520 Severe sepsis without septic shock: Secondary | ICD-10-CM | POA: Diagnosis present

## 2023-09-11 DIAGNOSIS — F17210 Nicotine dependence, cigarettes, uncomplicated: Secondary | ICD-10-CM | POA: Diagnosis present

## 2023-09-11 DIAGNOSIS — A419 Sepsis, unspecified organism: Secondary | ICD-10-CM

## 2023-09-11 DIAGNOSIS — R0600 Dyspnea, unspecified: Secondary | ICD-10-CM

## 2023-09-11 DIAGNOSIS — G8253 Quadriplegia, C5-C7 complete: Secondary | ICD-10-CM | POA: Diagnosis present

## 2023-09-11 DIAGNOSIS — E119 Type 2 diabetes mellitus without complications: Secondary | ICD-10-CM | POA: Diagnosis present

## 2023-09-11 HISTORY — DX: Pneumonia, unspecified organism: J18.9

## 2023-09-11 LAB — TROPONIN T 3 HR W/ DELTA HIGH SENSITIVITY (IP/ED ONLY)
TROP T 0-3 HR DELTA High Sensitivity: -1 — ABNORMAL LOW (ref 0–4)
TROP T 3 HR High Sensitivity: 6 ng/L (ref 0–14)

## 2023-09-11 LAB — LACTATE, PLASMA: Lactate: 1.8 mmol/L (ref 0.5–2.2)

## 2023-09-11 LAB — CBC AND DIFFERENTIAL
Baso # K/uL: 0 10*3/uL (ref 0.0–0.2)
Eos # K/uL: 0.1 10*3/uL (ref 0.0–0.5)
Hematocrit: 43 % (ref 34–49)
Hemoglobin: 13.8 g/dL (ref 11.2–16.0)
IMM Granulocytes #: 0.1 10*3/uL — ABNORMAL HIGH (ref 0.0–0.0)
IMM Granulocytes: 0.3 %
Lymph # K/uL: 4.5 10*3/uL (ref 1.0–5.0)
MCV: 94 fL (ref 75–100)
Mono # K/uL: 0.7 10*3/uL (ref 0.1–1.0)
Neut # K/uL: 10 10*3/uL — ABNORMAL HIGH (ref 1.5–6.5)
Nucl RBC # K/uL: 0 10*3/uL (ref 0.0–0.0)
Nucl RBC %: 0 /100 WBC (ref 0.0–0.2)
Platelets: 128 10*3/uL — ABNORMAL LOW (ref 150–450)
RBC: 4.6 MIL/uL (ref 4.0–5.5)
RDW: 14.9 % (ref 0.0–15.0)
Seg Neut %: 65.2 %
WBC: 15.4 10*3/uL — ABNORMAL HIGH (ref 3.5–11.0)

## 2023-09-11 LAB — BASIC METABOLIC PANEL
Anion Gap: 16 (ref 7–16)
CO2: 21 mmol/L (ref 20–28)
Calcium: 9.7 mg/dL (ref 8.8–10.2)
Chloride: 105 mmol/L (ref 96–108)
Creatinine: 0.5 mg/dL — ABNORMAL LOW (ref 0.51–0.95)
Glucose: 183 mg/dL — ABNORMAL HIGH (ref 60–99)
Lab: 11 mg/dL (ref 6–20)
Potassium: 3.6 mmol/L (ref 3.3–5.1)
Sodium: 142 mmol/L (ref 133–145)
eGFR BY CREAT: 123 *

## 2023-09-11 LAB — TROPONIN T 1 HR W/ DELTA HIGH SENSITIVITY
TROP T 0-1 HR DELTA High Sensitivity: 1 (ref 0–2)
TROP T 1 HR High Sensitivity: 8 ng/L (ref 0–11)

## 2023-09-11 LAB — COVID/INFLUENZA A & B/RSV NAAT (PCR)
COVID-19 NAAT (PCR): NEGATIVE
Influenza A NAAT (PCR): NEGATIVE
Influenza B NAAT (PCR): NEGATIVE
RSV NAAT (PCR): NEGATIVE

## 2023-09-11 LAB — HOLD BLUE

## 2023-09-11 LAB — TROPONIN T 0 HR HIGH SENSITIVITY (IP/ED ONLY): TROP T 0 HR High Sensitivity: 7 ng/L (ref 0–11)

## 2023-09-11 LAB — PERFORMING LAB

## 2023-09-11 LAB — MAGNESIUM: Magnesium: 1.7 mg/dL (ref 1.6–2.5)

## 2023-09-11 MED ORDER — SODIUM CHLORIDE 0.9 % FLUSH FOR PUMPS *I*
0.0000 mL/h | INTRAVENOUS | Status: DC | PRN
Start: 2023-09-11 — End: 2023-11-10

## 2023-09-11 MED ORDER — BISACODYL 10 MG RE SUPP *I*
10.0000 mg | RECTAL | Status: DC
Start: 2023-09-12 — End: 2023-09-13
  Administered 2023-09-12: 10 mg via RECTAL
  Filled 2023-09-11: qty 1

## 2023-09-11 MED ORDER — ACETAMINOPHEN 500 MG PO TABS *I*
1000.0000 mg | ORAL_TABLET | Freq: Three times a day (TID) | ORAL | Status: DC | PRN
Start: 2023-09-11 — End: 2023-09-13
  Administered 2023-09-13 (×2): 1000 mg via ORAL
  Filled 2023-09-11 (×2): qty 2

## 2023-09-11 MED ORDER — MELATONIN 3 MG PO TABS *I*
3.0000 mg | ORAL_TABLET | Freq: Every evening | ORAL | Status: DC | PRN
Start: 2023-09-11 — End: 2023-09-13
  Administered 2023-09-13: 3 mg via ORAL
  Filled 2023-09-11: qty 1

## 2023-09-11 MED ORDER — LACTATED RINGERS IV BOLUS *I*
1000.0000 mL | Freq: Once | INTRAVENOUS | Status: AC
Start: 2023-09-11 — End: 2023-09-12
  Administered 2023-09-11: 1000 mL via INTRAVENOUS

## 2023-09-11 MED ORDER — CEFTRIAXONE SODIUM 1 G IN STERILE WATER 10ML SYRINGE *I*
1000.0000 mg | Freq: Once | INTRAVENOUS | Status: AC
Start: 2023-09-11 — End: 2023-09-11
  Administered 2023-09-11: 1000 mg via INTRAVENOUS
  Filled 2023-09-11: qty 10

## 2023-09-11 MED ORDER — ENOXAPARIN SODIUM 40 MG/0.4ML IJ SOSY *I*
40.0000 mg | PREFILLED_SYRINGE | Freq: Every day | INTRAMUSCULAR | Status: DC
Start: 2023-09-11 — End: 2023-09-11

## 2023-09-11 MED ORDER — POLYETHYLENE GLYCOL 3350 PO PACK 17 GM *I*
17.0000 g | PACK | Freq: Two times a day (BID) | ORAL | Status: DC
Start: 2023-09-11 — End: 2023-11-10
  Filled 2023-09-11 (×2): qty 17

## 2023-09-11 MED ORDER — FONDAPARINUX SODIUM 2.5 MG/0.5ML SC SOLN *I*
2.5000 mg | Freq: Every day | SUBCUTANEOUS | Status: DC
Start: 2023-09-12 — End: 2023-09-13
  Administered 2023-09-12 – 2023-09-13 (×2): 2.5 mg via SUBCUTANEOUS
  Filled 2023-09-11 (×3): qty 0.5

## 2023-09-11 MED ORDER — ACETAMINOPHEN 500 MG PO TABS *I*
1000.0000 mg | ORAL_TABLET | Freq: Once | ORAL | Status: AC
Start: 2023-09-11 — End: 2023-09-11
  Administered 2023-09-11: 1000 mg via ORAL
  Filled 2023-09-11: qty 2

## 2023-09-11 MED ORDER — SERTRALINE HCL 50 MG PO TABS *I*
50.0000 mg | ORAL_TABLET | Freq: Every day | ORAL | Status: DC
Start: 2023-09-12 — End: 2023-11-11
  Administered 2023-09-12 – 2023-09-13 (×2): 50 mg via ORAL
  Filled 2023-09-11 (×2): qty 1

## 2023-09-11 MED ORDER — CEFTRIAXONE SODIUM 2 G IN STERILE WATER 20ML SYRINGE *I*
2000.0000 mg | INTRAVENOUS | Status: DC
Start: 2023-09-12 — End: 2023-09-19
  Administered 2023-09-12: 2000 mg via INTRAVENOUS
  Filled 2023-09-11: qty 20

## 2023-09-11 MED ORDER — SENNOSIDES 8.6 MG PO TABS *I*
2.0000 | ORAL_TABLET | Freq: Two times a day (BID) | ORAL | Status: DC
Start: 2023-09-11 — End: 2023-11-10
  Administered 2023-09-11 – 2023-09-12 (×2): 2 via ORAL
  Filled 2023-09-11 (×3): qty 2

## 2023-09-11 MED ORDER — LACTATED RINGERS IV BOLUS *I*
1000.0000 mL | Freq: Once | INTRAVENOUS | Status: AC
Start: 2023-09-11 — End: 2023-09-11
  Administered 2023-09-11: 1000 mL via INTRAVENOUS

## 2023-09-11 MED ORDER — DEXTROSE 5 % FLUSH FOR PUMPS *I*
0.0000 mL/h | INTRAVENOUS | Status: DC | PRN
Start: 2023-09-11 — End: 2023-11-10

## 2023-09-11 MED ORDER — AZITHROMYCIN 250 MG PO TABS *I*
500.0000 mg | ORAL_TABLET | Freq: Every day | ORAL | Status: AC
Start: 2023-09-12 — End: 2023-09-14
  Administered 2023-09-12 – 2023-09-13 (×2): 500 mg via ORAL
  Filled 2023-09-11 (×2): qty 2

## 2023-09-11 MED ORDER — LACTATED RINGERS IV SOLN *I*
100.0000 mL/h | INTRAVENOUS | Status: AC
Start: 2023-09-11 — End: 2023-09-12
  Administered 2023-09-12: 100 mL/h via INTRAVENOUS

## 2023-09-11 MED ORDER — AZITHROMYCIN 250 MG PO TABS *I*
500.0000 mg | ORAL_TABLET | Freq: Once | ORAL | Status: AC
Start: 2023-09-11 — End: 2023-09-11
  Administered 2023-09-11: 500 mg via ORAL
  Filled 2023-09-11: qty 2
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
## 2023-09-11 NOTE — ED Triage Notes (Signed)
Patient from home with 2 days of not feeling well, developing chest pain today. She has had a decrease in urine OP and loss of appetite starting this morning. EKG requested in triage. She is quadriplegic    Blood Glucose Meter (mg/dl): 161  Prehospital medications given: No
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
## 2023-09-12 DIAGNOSIS — B9620 Unspecified Escherichia coli [E. coli] as the cause of diseases classified elsewhere: Secondary | ICD-10-CM

## 2023-09-12 LAB — CBC AND DIFFERENTIAL
Baso # K/uL: 0 10*3/uL (ref 0.0–0.2)
Eos # K/uL: 0 10*3/uL (ref 0.0–0.5)
Hematocrit: 39 % (ref 34–49)
Hemoglobin: 12.6 g/dL (ref 11.2–16.0)
IMM Granulocytes #: 0.1 10*3/uL — ABNORMAL HIGH (ref 0.0–0.0)
IMM Granulocytes: 0.4 %
Lymph # K/uL: 1.6 10*3/uL (ref 1.0–5.0)
MCV: 94 fL (ref 75–100)
Mono # K/uL: 0.7 10*3/uL (ref 0.1–1.0)
Neut # K/uL: 12.8 10*3/uL — ABNORMAL HIGH (ref 1.5–6.5)
Nucl RBC # K/uL: 0 10*3/uL (ref 0.0–0.0)
Nucl RBC %: 0 /100 WBC (ref 0.0–0.2)
Platelets: 92 10*3/uL — ABNORMAL LOW (ref 150–450)
RBC: 4.1 MIL/uL (ref 4.0–5.5)
RDW: 14.9 % (ref 0.0–15.0)
Seg Neut %: 84.2 %
WBC: 15.2 10*3/uL — ABNORMAL HIGH (ref 3.5–11.0)

## 2023-09-12 LAB — BASIC METABOLIC PANEL
Anion Gap: 14 (ref 7–16)
CO2: 22 mmol/L (ref 20–28)
Calcium: 9.2 mg/dL (ref 8.8–10.2)
Chloride: 107 mmol/L (ref 96–108)
Creatinine: 0.46 mg/dL — ABNORMAL LOW (ref 0.51–0.95)
Glucose: 164 mg/dL — ABNORMAL HIGH (ref 60–99)
Lab: 11 mg/dL (ref 6–20)
Potassium: 3.6 mmol/L (ref 3.3–5.1)
Sodium: 143 mmol/L (ref 133–145)
eGFR BY CREAT: 125 *

## 2023-09-12 LAB — AEROBIC CULTURE: Aerobic Culture: 0 — AB

## 2023-09-12 MED ORDER — ONDANSETRON HCL 4 MG PO TABS *I*
4.0000 mg | ORAL_TABLET | Freq: Three times a day (TID) | ORAL | Status: DC | PRN
Start: 2023-09-12 — End: 2023-09-13

## 2023-09-12 MED ORDER — LACTATED RINGERS IV BOLUS *I*
1000.0000 mL | Freq: Once | INTRAVENOUS | Status: AC
Start: 2023-09-12 — End: 2023-09-12
  Administered 2023-09-12: 1000 mL via INTRAVENOUS

## 2023-09-12 NOTE — Progress Notes (Addendum)
09/12/23 0832   [facility] Patient Class Review   Patient Class Review Observation     Patient class effective as of 09/11/23.      Pt notified at bedside of OBS @ 1525 . Copy given to patient, and copy signed by patient for chart.       Ross Marcus RN BSN    Utilization Management     Secure chat or [phone]

## 2023-09-12 NOTE — ED Notes (Signed)
Report Given To  Jess, RN      Descriptive Sentence / Reason for Admission   Patient from home with 2 days of not feeling well, developing chest pain today. She has had a decrease in urine OP and loss of appetite starting this morning.      Active Issues / Relevant Events   FULL CODE  A&Ox4  Quadriplegic hx spinal injury   Urostomy   PNA        To Do List  Meds per [facility]  VS/A  Once roomed, order low air loss mattress      Anticipatory Guidance / Discharge Planning  Admitted for PNA
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
## 2023-09-12 NOTE — ED Notes (Signed)
Report Given To  Michaela RN       Descriptive Sentence / Reason for Admission   Patient from home with 2 days of not feeling well, developing chest pain today. She has had a decrease in urine OP and loss of appetite starting this morning.      Active Issues / Relevant Events   FULL CODE  A&Ox4  Quadriplegic hx spinal injury   Urostomy   PNA        To Do List  Meds per [facility]  VS/A  Once roomed, order low air loss mattress      Anticipatory Guidance / Discharge Planning  Admitted for PNA

## 2023-09-12 NOTE — Discharge Instructions (Addendum)
Your diagnosis was: Sepsis, UTI      What was done:   IV antibiotics, wound consult, IVF    What do I need to do:       Recommended diet: Regular - No restrictions    Recommended activity: activity as tolerated    Wound Care: {wound care:18263}    Medication changes to highlight:     Labs:       If you experience any of these symptoms 24 hours or more after discharge:Uncontrolled pain, Chest pain, Shortness of breath, Fever of 101 F. or greater, Chills, Poor appetite, Poor urinary output, Vomiting, Nausea, Diarrhea, Blood in stool or Weakness  Call Dr. Morrie Sheldon        If you experience any of these symptoms within the first 24 hours after discharge call Dr. Allena Katz  at [phone]      Smoking    Smoking can increase your chances of developing chronic health problems or worsen conditions you already have.  If you smoke you should quit. Smoking cessation information has been given to you for your review to help you quit.  Medications to help you quit are available.  Ask your doctor if you would like to receive these medications.       Medications  Your doctor has prescribed medications to improve or manage your condition.  You should take them as prescribed by your doctor.  Ask your doctor for any questions regarding these medications.          Diet  A healthy diet is important to help you stay well.  Some health conditions require you to be on a special diet.  You should monitor your fluid intake and limit the amount of sodium including table salt.  This will help you avoid fluid retention which can cause shortness of breath or swelling of the feet and ankles.  Reading food labels is helpful when you are on a special diet.  Follow instructions from your doctor for any other special dietary requirement.    Exercise/Activity  Activity and exercise are important to your well being.  While you are in the hospital your activity may be restricted.  As your condition improves your activity level will be increased.  Most patients  will be able to gradually resume activity as before.  You should follow your doctor's activity recommendations      Daily Weight  You will need to weigh yourself every day at the same time on the same scale, preferably after you empty your bladder.  If you have an increase of 3 pounds in 2 days or 5 pounds in 1 week you should contact your physician.  A daily written log of your weights is a good way to keep track.  You should follow your doctor's recommendations on monitoring your weight.     What to do if your condition changes?  If at any time you have any questions or concerns or your condition gets worse, contact your physician.  If you can not reach your physician or you develop life threatening symptoms such as trouble breathing or chest pain you should go to the closest Emergency Department.

## 2023-09-12 NOTE — ED Notes (Signed)
Assumed care of patient at this time. Writer visualized patient. Patient is resting comfortably and does not appear to be in acute distress. Will continue to monitor and treat per provider orders.

## 2023-09-12 NOTE — ED Notes (Signed)
Low air loss mattress ordered at this time by ED CRN.

## 2023-09-12 NOTE — ED Notes (Signed)
Report Given To  Belenda Cruise, RN      Descriptive Sentence / Reason for Admission   Patient from home with 2 days of not feeling well, developing chest pain today. She has had a decrease in urine OP and loss of appetite starting this morning.      Active Issues / Relevant Events   FULL CODE  A&Ox4  Quadriplegic hx spinal injury   Urostomy   PNA        To Do List  Meds per [facility]  VS/A  Low air mattress ordered 09/15  Turn/reposition      Anticipatory Guidance / Discharge Planning  Admitted for PNA

## 2023-09-12 NOTE — ED Notes (Signed)
Report Given To  Meghan RN       Descriptive Sentence / Reason for Admission   Patient from home with 2 days of not feeling well, developing chest pain today. She has had a decrease in urine OP and loss of appetite starting this morning.      Active Issues / Relevant Events   Code Status: Full  A&Ox4  quadriplegic  Urine + for E. Coli  PNA        To Do List  Meds per [facility]  VS/A      Anticipatory Guidance / Discharge Planning  Admitted for PNA

## 2023-09-12 NOTE — Progress Notes (Signed)
Patient seen & chart reviewed.  Seen with Dr. Allena Katz   Please refer to his note for details of visit.    Vitals:    09/11/23 2237 09/12/23 0244 09/12/23 0655 09/12/23 1058   BP: 97/67 130/88 123/80 115/79   BP Location: Left arm Left arm Left arm Left arm   Pulse: (!) 120 (!) 127 (!) 114 (!) 112   Resp: 19 18 18 16    Temp: 36.2 C (97.2 F) 38 C (100.4 F) 36.9 C (98.4 F) 36.9 C (98.4 F)   TempSrc: Temporal Temporal Temporal Temporal   SpO2: 96% 97% 94% 92%   Weight:       Height:           Labs: reviewed    Recent Labs   Lab 09/12/23  0247 09/11/23  1923   WBC 15.2* 15.4*   Hemoglobin 12.6 13.8   Hematocrit 39 43   Platelets 92* 128*         Lab results: 09/12/23  0247 09/11/23  1923 04/23/23  0048   Sodium 143 142 141   Potassium 3.6 3.6 3.6   Chloride 107 105 107   CO2 22 21 21    UN 11 11 12    Creatinine 0.46* 0.50* 0.48*   Glucose 164* 183* 174*   Calcium 9.2 9.7 9.2     Brief addem to note:    Severe sepsis:  Urine culture 9/13 sensitive to ceftriaxone   Continue ceftriaxone and Azithromycin day 2  Continue IVF until 6pm tonight     Neurogenic bowel:+bm today   Continue bowel regimen     Sacral wounds: last note from wound clinic 5/23  Wound consult ordered     DVT ppx: Fondaparinux     Scheduled Meds:   bisacodyl  10 mg Rectal Every Other Day    senna  2 tablet Oral 2 times per day    sertraline  50 mg Oral Daily    polyethylene glycol  17 g Oral 2 times per day    cefTRIAXone  2,000 mg Intravenous Q24H    fondaparinux  2.5 mg Subcutaneous Daily    azithromycin  500 mg Oral Daily           Meleny Tregoning E Ayliana Casciano, NP  12:23 PM  09/12/2023

## 2023-09-13 ENCOUNTER — Ambulatory Visit: Payer: Medicaid Other | Admitting: Obstetrics and Gynecology

## 2023-09-13 ENCOUNTER — Telehealth: Payer: Self-pay

## 2023-09-13 ENCOUNTER — Other Ambulatory Visit: Payer: Self-pay

## 2023-09-13 DIAGNOSIS — J189 Pneumonia, unspecified organism: Secondary | ICD-10-CM

## 2023-09-13 LAB — BASIC METABOLIC PANEL
Anion Gap: 12 (ref 7–16)
CO2: 24 mmol/L (ref 20–28)
Calcium: 9.1 mg/dL (ref 8.8–10.2)
Chloride: 105 mmol/L (ref 96–108)
Creatinine: 0.42 mg/dL — ABNORMAL LOW (ref 0.51–0.95)
Glucose: 115 mg/dL — ABNORMAL HIGH (ref 60–99)
Lab: 8 mg/dL (ref 6–20)
Potassium: 3.4 mmol/L (ref 3.3–5.1)
Sodium: 141 mmol/L (ref 133–145)
eGFR BY CREAT: 128 *

## 2023-09-13 LAB — CBC
Hematocrit: 37 % (ref 34–49)
Hemoglobin: 11.9 g/dL (ref 11.2–16.0)
MCV: 93 fL (ref 75–100)
Platelets: 78 10*3/uL — ABNORMAL LOW (ref 150–450)
RBC: 3.9 MIL/uL — ABNORMAL LOW (ref 4.0–5.5)
RDW: 14.6 % (ref 0.0–15.0)
WBC: 12.2 10*3/uL — ABNORMAL HIGH (ref 3.5–11.0)

## 2023-09-13 MED ORDER — CEFPODOXIME PROXETIL 200 MG PO TABS *I*
200.0000 mg | ORAL_TABLET | Freq: Two times a day (BID) | ORAL | 0 refills | Status: AC
Start: 2023-09-14 — End: 2023-09-19
  Filled 2023-09-13: qty 10, 5d supply, fill #0

## 2023-09-13 MED ORDER — CEFTRIAXONE SODIUM 2 G IN STERILE WATER 20ML SYRINGE *I*
2000.0000 mg | INTRAVENOUS | Status: DC
Start: 2023-09-13 — End: 2023-09-13
  Administered 2023-09-13: 2000 mg via INTRAVENOUS
  Filled 2023-09-13: qty 20

## 2023-09-13 NOTE — Telephone Encounter (Signed)
Now noted in her chart for Cindy.    ===View-only below this line===  ----- Message -----  From: Gloriann Loan, NP  Sent: 09/13/2023   8:15 AM EDT  To: Arlyce Harman; Cindy Ochoa  Subject: RE: appt cancellation                            This patient comes in quadriplegic and last seen came by motorized wheel chair and needs to be seen in a tx room. I'm no sure it this is documented anywhere but would want this noted for her visits. Not sure if her status has changes and if she comes by wheelchair or stretcher now.  ----- Message -----  From: Arlyce Harman  Sent: 09/13/2023   7:57 AM EDT  To: Gloriann Loan, NP; Cindy Ochoa  Subject: appt cancellation                                This patient's agy schedule for today at 10 am with Malachi Bonds is being cancelled due to the patient being admitted at [facility].    Thank you,    Lowella Bandy

## 2023-09-13 NOTE — Consults (Signed)
Wound Consult Note, Initial    AC-19R    Dorita Ringor 37 y.o. female was referred by Harless Nakayama, MD for evaluation and treatment recommendations regarding: "pressure."   Admitted to [facility] on 09/12/2023.   HPI, PMH/PSH, Labs, MAR reviewed.   Wound initially identified and assessed on 09/13/2023    Wound associated complaints:    []  Pain (rate 0-10)  []  Burning   []  Itching  []  Odor  []  Drainage  [x]  No complaints    Focused Exam    Patient alert, awake, NAD. Pt resting in LAL mattress. Pt able to hold self on each side after assistance rolling.  Head-to-toe skin assessment: BLE, BUE, Back/Sacrum, Bilateral heels/feet.  Last Braden risk assessment score: 11 - indicating patient is at HIGH risk for pressure injury development  Current mattress type: LAL Mattress    Assessment  POA sacral wound r/t pressure (approx 1cm x 0.5cm x 0.5cm). Wound bed is dry, pink with white adherent fibrinous tissue.  POA R ischial wound r/t pressure (approx 1.5cm x 0.5cm x 0.5cm). Wound bed is dry, pink. Immediate periwound skin is macerated, then hypopigmented r/t previously healed wounds.  Indentation and dry, intact skin over L Ischium r/t previous pressure injury. Surrounding skin is dry, hypopigmented.      Sacrum (Turned to R side)  Feet ---> Head      R Ischium (Turned to R side)  Feet ---> Head      L Ischium (Turned to R side)  Feet ---> Head    "Gwendalyn Ege, RN,certify that the patient has authorized the use of photography for the purpose of the provision of health care at the [facility] of [facility] and affiliates and they understand that it will be included in the legal medical record."      Patient/significant other education re prevention/treatment of wounds  Assessment findings and wound/skin care recommendations communicated to patient and bedside nurse.    Recommendations  Needs to be premedicated with analgesic prior to wound care: [] Yes  [x] No  Turn and position every two hours and PRN. Use pillow  under alternating hips to offload sacrum, coccyx.  LAL mattress - Continue use  Offloading: Alternate Prevalon boot & pillow(s) to "float" heels off mattress.   Topical recommendations:    R Ischium / Sacrum - Cleanse wound bed with Phase One Wound Cleaner. Allow to dry 1 full minute then wipe clean with gauze. Apply Medihoney to wound bed. Pack wound with narrow cut strip of Exufiber gelling fiber, leave a tail outside of wound. Cover with Mepilex foam dressing. Cleanse and change dressing daily & PRN.  L Ischium - Cleanse wound bed with skin care wipes. Apply "glaze-thin" layer of critic aid barrier ointment twice daily & PRN.    Thank you for the consult.  Wound Nurse will plan follow-up as needed.       Hulan Amato, RN [facility]  [facility] Consult  Available through [employer]
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
## 2023-09-13 NOTE — Progress Notes (Signed)
[facility] SOCIAL WORK  PHARMACY FORM     Today's date:  September 13, 2023    Patient Name: Cindy Ochoa      Medical Record #: [id number]   DOB: [birth year]  Patient's Address: [address] [place]              Social Worker: Quentin Angst, LMSW       Date of Service: September 13, 2023       Funding Source: SW Medication Assistance Fund  ___________________________________________________________________    Pharmacy Information:  Date/time sent: September 13, 2023     Time needed: ASAP     Patient Location: ED    Medication Pick-up Preference: Patient will pick up at the pharmacy    Pharmacy Contact: Misty Stanley   Social work signature: Quentin Angst, LMSW  Supervisor/Manager Approval (if indicated):  Date:  (supervisor signature not required for Medicaid pending)    SW contacted as pt's medication needs prior authorization. SW approved voucher.

## 2023-09-13 NOTE — Progress Notes (Addendum)
09/13/23 1017   UM Patient Class Review   Patient Class Review Inpatient     Patient class effective as of 09/13/23.      1012 Called into patient's mobile [phone] to notify of inpatient. Patient did not answer phone. Will continue to attempt to notify.     1012 Called into patient's room @ [phone] to notify of inpatient. Patient did not answer phone. Will continue to attempt to notify.     1103 Called into patient's room @ [phone] to notify of inpatient. Patient did not answer phone. Will mail OBS to IP form to address listed on file.     Ross Marcus RN BSN    Utilization Management     Secure chat or [phone]

## 2023-09-14 ENCOUNTER — Other Ambulatory Visit: Payer: Self-pay

## 2023-09-14 LAB — BLOOD CULTURE

## 2023-09-14 NOTE — Patient Instructions (Signed)
We care about you and your health and are monitoring your health now that you're home from the hospital.  I will be your Transitional Care Manager and will be managing your care up to the next 30 days.   As I review your progress during this period of transition at home, you will see a Patient Outreach After Visit Summary in MyChart as a result of direct phone calls with you, or my review of your patient chart. This is a standard of care and there is no charge for this service.       Please feel free to reach out to me with any questions related to this.     If you have any urgent requests or issues, please call your doctor's office.     Eutimio Gharibian K Morine Kohlman, RN
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
## 2023-09-17 LAB — BLOOD CULTURE: Bacterial Blood Culture: 0

## 2023-09-21 ENCOUNTER — Other Ambulatory Visit: Payer: Self-pay

## 2023-09-21 ENCOUNTER — Ambulatory Visit: Payer: Medicaid Other

## 2023-09-21 VITALS — BP 100/60 | HR 77 | Ht 65.0 in | Wt 148.0 lb

## 2023-09-21 DIAGNOSIS — L8990 Pressure ulcer of unspecified site, unspecified stage: Secondary | ICD-10-CM

## 2023-09-21 DIAGNOSIS — Z09 Encounter for follow-up examination after completed treatment for conditions other than malignant neoplasm: Secondary | ICD-10-CM

## 2023-09-21 DIAGNOSIS — K592 Neurogenic bowel, not elsewhere classified: Secondary | ICD-10-CM

## 2023-09-21 MED ORDER — MEPILEX FOAM BANADAGES MISC
0 refills | Status: AC | PRN
Start: 2023-09-21 — End: ?

## 2023-09-21 MED ORDER — MEDIHONEY WOUND/BURN DRESSING EX GEL *I*
1.0000 mL | CUTANEOUS | 0 refills | Status: AC | PRN
Start: 2023-09-21 — End: ?

## 2023-09-21 MED ORDER — AQUACEL AG FOAM 3.2"X3.2" EX PADS
1.0000 | MEDICATED_PAD | CUTANEOUS | 0 refills | Status: AC | PRN
Start: 2023-09-21 — End: ?

## 2023-09-21 NOTE — CDI Query (Signed)
Request for Documentation Clarification  [facility]    Kafi, Kezer  MRN [id number]; VISIT [id number]; Kathaleen Grinder [id number]      Query Response  ==============    Sent: 09/21/23 15:27 EDT  From: Rise Paganini, MD    QUERY QUESTION: BASED ON YOUR MEDICAL JUDGMENT, CAN YOU FURTHER CONFIRM THE  PRESSURE ULCER (AND STAGE) OR DEEP TISSUE INJURY?  Provider response: Pressure Ulcer/Injury Stage I (Nonblanchable erythema of  intact skin)    QUERY QUESTION: PLEASE SPECIFY THE LOCATION AND LATERALITY OF THE PRESSURE ULCER  OR PRESSURE-INDUCED DEEP TISSUE DAMAGE. SELECT ALL THAT APPLY:  Provider response: Left hip    QUERY QUESTION: PRESENT ON ADMISSION?  Provider response: Diagnosis was present at time of inpatient admission    Electronically signed by Rise Paganini, MD on 09/21/23 at 15:27 EDT      Original Query  ==============    Sent: 09/13/23 16:05 EDT  From: Julious Payer, BS, RN, RHIA  To: Rise Paganini, MD    BASED ON YOUR MEDICAL JUDGMENT, CAN YOU FURTHER CONFIRM THE PRESSURE ULCER (AND  STAGE) OR DEEP TISSUE INJURY?  * Pressure Ulcer/Injury Stage I (Nonblanchable erythema of intact skin)  * Pressure-induced deep tissue damage (Purple or maroon localized area of  intact skin due to damage of underlying soft tissue from pressure and/or  shear)  * Other:    PLEASE SPECIFY THE LOCATION AND LATERALITY OF THE PRESSURE ULCER OR  PRESSURE-INDUCED DEEP TISSUE DAMAGE. SELECT ALL THAT APPLY:  * Left hip  * Other:    PRESENT ON ADMISSION?  * Diagnosis was present at time of inpatient admission  * Diagnosis was not present at time of inpatient admission  * Clinically unable to determine whether the condition was present at the time  of inpatient admission    CLINICAL INFORMATION  * Documentation in the medical record indicates that this patient has been  identified to have and/or is being treated for a Pressure Ulcer/Injury of the  following site: Hulan Amato, RN at 09/13/2023 11:02 AM "POA R  ischial  wound r/t pressure (approx 1.5cm x 0.5cm x 0.5cm). Wound bed is dry, pink.  Immediate periwound skin is macerated, then hypopigmented r/t previously  healed wounds.  Indentation and dry, intact skin over L Ischium r/t previous pressure injury.  Surrounding skin is dry, hypopigmented.

## 2023-09-21 NOTE — CDI Query (Signed)
Request for Documentation Clarification  [facility]    Reaghan, Kintzel  MRN [id number]; VISIT [id number]; Kathaleen Grinder [id number]      Query Response  ==============    Sent: 09/21/23 15:26 EDT  From: Rise Paganini, MD    QUERY QUESTION: BASED ON YOUR MEDICAL JUDGMENT, CAN YOU FURTHER CONFIRM THE  PRESSURE ULCER (AND STAGE) OR DEEP TISSUE INJURY?  Provider response: Pressure Ulcer/Injury Stage II (Partial thickness skin loss  involving epidermis, dermis, or both)    QUERY QUESTION: PLEASE SPECIFY THE LOCATION AND LATERALITY OF THE PRESSURE ULCER  OR PRESSURE-INDUCED DEEP TISSUE DAMAGE. SELECT ALL THAT APPLY:  Provider response: Sacral region/Coccyx    QUERY QUESTION: PRESENT ON ADMISSION?  Provider response: Diagnosis was present at time of inpatient admission    Electronically signed by Rise Paganini, MD on 09/21/23 at 15:26 EDT      Original Query  ==============    Sent: 09/13/23 15:58 EDT  From: Julious Payer, BS, RN, RHIA  To: Rise Paganini, MD    BASED ON YOUR MEDICAL JUDGMENT, CAN YOU FURTHER CONFIRM THE PRESSURE ULCER (AND  STAGE) OR DEEP TISSUE INJURY?  * Pressure Ulcer/Injury Stage I (Nonblanchable erythema of intact skin)  * Pressure Ulcer/Injury Stage II (Partial thickness skin loss involving  epidermis, dermis, or both)  * Pressure Ulcer/Injury, Unstageable (Stage cannot be clinically  determined-e.g.ulcer is covered by eschar or has been treated with skin or  muscle graft)  * Pressure-induced deep tissue damage (Purple or maroon localized area of  intact skin due to damage of underlying soft tissue from pressure and/or  shear)  * Other:    PLEASE SPECIFY THE LOCATION AND LATERALITY OF THE PRESSURE ULCER OR  PRESSURE-INDUCED DEEP TISSUE DAMAGE. SELECT ALL THAT APPLY:  * Sacral region/Coccyx  * Other:    PRESENT ON ADMISSION?  * Diagnosis was present at time of inpatient admission  * Diagnosis was not present at time of inpatient admission  * Clinically unable to determine whether  the condition was present at the time  of inpatient admission    CLINICAL INFORMATION  * Documentation in the medical record indicates that this patient has been  identified to have and/or is being treated for a Pressure Ulcer/Injury of the  following site: Hulan Amato, RN at 09/13/2023 11:02 AM "POA sacral wound  r/t pressure (approx 1cm x 0.5cm x 0.5cm). Wound bed is dry, pink with white  adherent fibrinous tissue."

## 2023-09-21 NOTE — Patient Instructions (Addendum)
THESE ARE THE RECOMMENDATIONS BY WOUND CARE FOR YOUR PRESSURE WOUNDS, since exufiber was not available I sent aquacel  Recommendations  Needs to be premedicated with analgesic prior to wound care: [] Yes  [x] No  Turn and position every two hours and PRN. Use pillow under alternating hips to offload sacrum, coccyx.  LAL mattress - Continue use  Offloading: Alternate Prevalon boot & pillow(s) to "float" heels off mattress.   Topical recommendations:    R Ischium / Sacrum - Cleanse wound bed with Phase One Wound Cleaner. Allow to dry 1 full minute then wipe clean with gauze. Apply Medihoney to wound bed. Pack wound with narrow cut strip of Exufiber gelling fiber, leave a tail outside of wound. Cover with Mepilex foam dressing. Cleanse and change dressing daily & PRN.  L Ischium - Cleanse wound bed with skin care wipes. Apply "glaze-thin" layer of critic aid barrier ointment twice daily & PRN.
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
## 2023-09-21 NOTE — CDI Query (Signed)
Request for Documentation Clarification  [facility]    Tresa, Scogin  MRN [id number]; VISIT [id number]; Kathaleen Grinder [id number]      Query Response  ==============    Sent: 09/21/23 15:27 EDT  From: Rise Paganini, MD    QUERY QUESTION: BASED ON YOUR MEDICAL JUDGMENT, CAN YOU PLEASE CLARIFY THE  RELATIONSHIP BETWEEN THE DEVICE AND THE UTI?  Provider response: Other: UTI in setting of ileostomy    QUERY QUESTION: PRESENT ON ADMISSION?  Provider response: Diagnosis was present at time of inpatient admission    Electronically signed by Rise Paganini, MD on 09/21/23 at 15:27 EDT      Original Query  ==============    Sent: 09/13/23 19:09 EDT  From: Julious Payer, BS, RN, RHIA  To: Rise Paganini, MD    BASED ON YOUR MEDICAL JUDGMENT, CAN YOU PLEASE CLARIFY THE RELATIONSHIP BETWEEN  THE DEVICE AND THE UTI?  * UTI due to indwelling urethral catheter  * Other:    PRESENT ON ADMISSION?  * Diagnosis was present at time of inpatient admission  * Diagnosis was not present at time of inpatient admission  * Clinically unable to determine whether the condition was present at the time  of inpatient admission    CLINICAL INFORMATION  * -Documentation in the medical record indicates that this patient has been  admitted with or diagnosed as having a Urinary Tract Infection: Progress Notes  by Loel Ro at 09/13/2023 12:02  "#UTI Sepsis:  - Culture Data:"  * The following is also documented in the MEDICAL RECORD NUMBER * Indwelling urethral catheter: Progress Notes by Randie Heinz at 09/12/2023  08:28  "In addition has had dark colored and  foul smelling urine.  - Her catheter was exchanged on Friday"

## 2023-09-22 ENCOUNTER — Other Ambulatory Visit
Admission: RE | Admit: 2023-09-22 | Discharge: 2023-09-22 | Disposition: A | Discharge status: Home / Self Care | Payer: Medicaid Other | Source: Ambulatory Visit

## 2023-09-22 DIAGNOSIS — Z09 Encounter for follow-up examination after completed treatment for conditions other than malignant neoplasm: Secondary | ICD-10-CM | POA: Insufficient documentation

## 2023-09-22 LAB — CBC AND DIFFERENTIAL
Baso # K/uL: 0 10*3/uL (ref 0.0–0.2)
Eos # K/uL: 0.1 10*3/uL (ref 0.0–0.5)
Hematocrit: 45 % (ref 34–49)
Hemoglobin: 13.9 g/dL (ref 11.2–16.0)
IMM Granulocytes #: 0 10*3/uL (ref 0.0–0.0)
IMM Granulocytes: 0.4 %
Lymph # K/uL: 3 10*3/uL (ref 1.0–5.0)
MCV: 97 fL (ref 75–100)
Mono # K/uL: 0.4 10*3/uL (ref 0.1–1.0)
Neut # K/uL: 3.4 10*3/uL (ref 1.5–6.5)
Nucl RBC # K/uL: 0 10*3/uL (ref 0.0–0.0)
Nucl RBC %: 0 /100 WBC (ref 0.0–0.2)
Platelets: 79 10*3/uL — ABNORMAL LOW (ref 150–450)
RBC: 4.6 MIL/uL (ref 4.0–5.5)
RDW: 15.8 % — ABNORMAL HIGH (ref 0.0–15.0)
Seg Neut %: 49.3 %
WBC: 6.9 10*3/uL (ref 3.5–11.0)

## 2023-09-22 LAB — COMPREHENSIVE METABOLIC PANEL
ALT: 18 U/L (ref 0–35)
Albumin: 4.2 g/dL (ref 3.5–5.2)
Alk Phos: 68 U/L (ref 35–105)
Anion Gap: 12 (ref 7–16)
Bilirubin,Total: 0.3 mg/dL (ref 0.0–1.2)
CO2: 22 mmol/L (ref 20–28)
Calcium: 9.7 mg/dL (ref 8.8–10.2)
Chloride: 108 mmol/L (ref 96–108)
Creatinine: 0.42 mg/dL — ABNORMAL LOW (ref 0.51–0.95)
Glucose: 129 mg/dL — ABNORMAL HIGH (ref 60–99)
Lab: 9 mg/dL (ref 6–20)
Potassium: 4.4 mmol/L (ref 3.3–5.1)
Sodium: 142 mmol/L (ref 133–145)
Total Protein: 7.4 g/dL (ref 6.3–7.7)
eGFR BY CREAT: 128 *

## 2023-09-23 LAB — RESOLUTION

## 2023-09-24 ENCOUNTER — Other Ambulatory Visit: Payer: Self-pay | Admitting: Family Medicine

## 2023-09-24 ENCOUNTER — Telehealth: Payer: Self-pay | Admitting: Family Medicine

## 2023-09-24 DIAGNOSIS — D696 Thrombocytopenia, unspecified: Secondary | ICD-10-CM

## 2023-09-24 NOTE — Result Encounter Note (Signed)
Labs stable - thrombocytopenia noted but stable - please confirm with [name] no signs of bleeding or easy bruising.  Would like to recheck platelets in 4 weeks.  Labs ordered.  Please notify [name].    MRD

## 2023-09-24 NOTE — Telephone Encounter (Signed)
-----   Message from [facility], DO sent at 09/24/2023 10:28 AM EDT -----  Labs stable - thrombocytopenia noted but stable - please confirm with [name] no signs of bleeding or easy bruising.  Would like to recheck platelets in 4 weeks.  Labs ordered.  Please notify [name].    MRD

## 2023-09-24 NOTE — Telephone Encounter (Signed)
LM for pt to call back for lab results.

## 2023-09-25 LAB — EKG 12-LEAD
P: 44 deg
PR: 159 ms
QRS: 66 deg
QRSD: 77 ms
QT: 336 ms
QTc: 447 ms
Rate: 106 {beats}/min
T: 78 deg

## 2023-09-28 ENCOUNTER — Other Ambulatory Visit: Payer: Self-pay | Admitting: Family Medicine

## 2023-09-28 DIAGNOSIS — N3941 Urge incontinence: Secondary | ICD-10-CM

## 2023-09-28 NOTE — Telephone Encounter (Signed)
Last appt: 09/21/2023     Next appt:  Visit date not found

## 2023-09-29 ENCOUNTER — Telehealth: Payer: Self-pay

## 2023-09-29 ENCOUNTER — Encounter: Payer: Self-pay | Admitting: Gastroenterology

## 2023-09-29 MED ORDER — GENERIC DME *A*
5 refills | Status: DC
Start: 2023-09-29 — End: 2024-04-20

## 2023-09-29 NOTE — Telephone Encounter (Signed)
[place] forms faxed and confirmation received

## 2023-10-05 ENCOUNTER — Other Ambulatory Visit: Payer: Self-pay

## 2023-10-05 NOTE — Progress Notes (Signed)
TCM POST DISCHARGE CONTACT 5 OUTREACH      Care Management Intervention: Care Mgr Intervention Completed      Contact 4 Completed by:: PCP      Contact 4 Appointment/completion date:: 09/21/23      Spoke To:: patient      Do you have any new symptoms or concerns since we last spoke?: No (Says she's doing ok. Her wounds are "not draining as much.")         Do you know who to call if you begin to have any new problems or symptoms?: Yes      Have there been any changes to your medications since we last spoke? : No         Do you have all of your medications listed on your discharge summary and are you taking them as they are written on the bottle?: Yes         Do you have any questions or concerns about your medications?: No         Time spent on the call with the patient:: 5 minutes      Patient Care Management assessment and plan of care/next steps:: Patient said that a script for her wound care supplies was supposed to be sent to [facility], but they said they never got it.

## 2023-10-06 ENCOUNTER — Other Ambulatory Visit
Admission: RE | Admit: 2023-10-06 | Discharge: 2023-10-06 | Disposition: A | Discharge status: Home / Self Care | Payer: Medicaid Other | Source: Ambulatory Visit

## 2023-10-06 DIAGNOSIS — D696 Thrombocytopenia, unspecified: Secondary | ICD-10-CM

## 2023-10-06 LAB — CBC AND DIFFERENTIAL

## 2023-10-07 ENCOUNTER — Telehealth: Payer: Self-pay | Admitting: Family Medicine

## 2023-10-07 DIAGNOSIS — D696 Thrombocytopenia, unspecified: Secondary | ICD-10-CM

## 2023-10-07 LAB — RESOLUTION

## 2023-10-07 NOTE — Telephone Encounter (Signed)
Crystal called with lab results  CBC-DIFF was insufficient amount .   Lab would like a call back  [phone] option 1

## 2023-10-08 ENCOUNTER — Other Ambulatory Visit: Payer: Self-pay | Admitting: Family Medicine

## 2023-10-08 DIAGNOSIS — N3941 Urge incontinence: Secondary | ICD-10-CM

## 2023-10-08 NOTE — Telephone Encounter (Signed)
Last appt: 09/21/2023     Next appt:  Visit date not found

## 2023-10-09 MED ORDER — GENERIC DME *A*
5 refills | Status: DC
Start: 2023-10-09 — End: 2024-04-20

## 2023-10-11 ENCOUNTER — Encounter: Payer: Self-pay | Admitting: Family Medicine

## 2023-10-11 NOTE — Telephone Encounter (Signed)
Script faxed to [facility] [phone]

## 2023-10-11 NOTE — Telephone Encounter (Signed)
Printed and in my "nursing" folder

## 2023-10-11 NOTE — Telephone Encounter (Signed)
Spoke to the lab. They will call the patient and have her redrawn.

## 2023-10-13 ENCOUNTER — Other Ambulatory Visit
Admission: RE | Admit: 2023-10-13 | Discharge: 2023-10-13 | Disposition: A | Discharge status: Home / Self Care | Payer: Medicaid Other | Source: Ambulatory Visit | Attending: Family Medicine | Admitting: Family Medicine

## 2023-10-13 DIAGNOSIS — D696 Thrombocytopenia, unspecified: Secondary | ICD-10-CM | POA: Insufficient documentation

## 2023-10-13 LAB — CBC AND DIFFERENTIAL
Baso # K/uL: 0 10*3/uL (ref 0.0–0.2)
Eos # K/uL: 0.1 10*3/uL (ref 0.0–0.5)
Hematocrit: 47 % (ref 34–49)
Hemoglobin: 14.9 g/dL (ref 11.2–16.0)
IMM Granulocytes #: 0 10*3/uL (ref 0.0–0.0)
IMM Granulocytes: 0.3 %
Lymph # K/uL: 3.8 10*3/uL (ref 1.0–5.0)
MCV: 95 fL (ref 75–100)
Mono # K/uL: 0.5 10*3/uL (ref 0.1–1.0)
Neut # K/uL: 3.5 10*3/uL (ref 1.5–6.5)
Nucl RBC # K/uL: 0 10*3/uL (ref 0.0–0.0)
Nucl RBC %: 0 /100{WBCs} (ref 0.0–0.2)
Platelets: 114 10*3/uL — ABNORMAL LOW (ref 150–450)
RBC: 4.9 MIL/uL (ref 4.0–5.5)
RDW: 15.7 % — ABNORMAL HIGH (ref 0.0–15.0)
Seg Neut %: 44.7 %
WBC: 7.8 10*3/uL (ref 3.5–11.0)

## 2023-10-16 ENCOUNTER — Other Ambulatory Visit: Payer: Self-pay

## 2023-11-02 ENCOUNTER — Other Ambulatory Visit: Payer: Self-pay

## 2023-11-12 ENCOUNTER — Encounter: Payer: Self-pay | Admitting: Gastroenterology

## 2023-11-12 NOTE — Telephone Encounter (Signed)
Orders faxed again and confirmation received.

## 2023-11-17 ENCOUNTER — Telehealth: Payer: Medicaid Other | Admitting: Family Medicine

## 2023-11-17 DIAGNOSIS — L03119 Cellulitis of unspecified part of limb: Secondary | ICD-10-CM

## 2023-11-17 MED ORDER — DOXYCYCLINE HYCLATE 100 MG PO TABS *I*
100.0000 mg | ORAL_TABLET | Freq: Two times a day (BID) | ORAL | 0 refills | Status: DC
Start: 2023-11-17 — End: 2023-11-18

## 2023-11-17 NOTE — Progress Notes (Signed)
Please see the MyChart message reply to our thread subject: "Antibiotic request" for my assessment and plan.    This patient gave consent for this Medical Advice Message and is aware that it may result in a bill to their insurance, as well as the possibility of receiving a bill for a copay and/or deductible.  They are an established patient, but are not seeing information exclusively about a problem treated during an in-person or video visit in the last seven days.    I spent a total of 5 minutes on 11/17/2023 reviewing the patient's prior medical records and current request for medical advice, prescribing medications or ordering tests (if applicable), replying to the patient, and documenting the encounter.    Clinically suspect focal cellulitis and abscess of axillary skin.  Recommend:    1) 5 Ashira Kirsten course oral doxycycline - sent to [facility] tonight, should be ready tomorrow AM  2) recommend hot compresses, soap and water wash daily, and air dry when possible.  Consider over the counter ABD pad applied to axillary skin during the Beautif[employer] [place]isar to help with drainage and healing.    If not improvement in 5 days, seen in person evaluation.    MRD

## 2023-11-17 NOTE — Telephone Encounter (Signed)
This patient attachment is clinically relevant.  Please keep in the patient's chart.    []  Document  [x]  Photo    Brief attachment description: abscess  (Ex. L forearm rash, WC papers)    Thank you,  Linard Millers, LPN

## 2023-11-18 ENCOUNTER — Other Ambulatory Visit: Payer: Self-pay

## 2023-11-18 DIAGNOSIS — L03119 Cellulitis of unspecified part of limb: Secondary | ICD-10-CM

## 2023-11-18 NOTE — Telephone Encounter (Signed)
Pharmacy has been updated, please re-send

## 2023-11-19 MED ORDER — DOXYCYCLINE HYCLATE 100 MG PO TABS *I*
100.0000 mg | ORAL_TABLET | Freq: Two times a day (BID) | ORAL | 0 refills | Status: DC
Start: 2023-11-19 — End: 2023-11-24

## 2023-12-08 ENCOUNTER — Other Ambulatory Visit: Payer: Self-pay

## 2023-12-08 ENCOUNTER — Ambulatory Visit
Payer: Medicare Other | Attending: Physical Medicine and Rehabilitation | Admitting: Physical Medicine and Rehabilitation

## 2023-12-08 VITALS — Ht 65.0 in | Wt 148.0 lb

## 2023-12-08 DIAGNOSIS — M62838 Other muscle spasm: Secondary | ICD-10-CM | POA: Insufficient documentation

## 2023-12-08 MED ORDER — INCOBOTULINUMTOXINA 100 UNITS IM SOLR *I*
100.0000 [IU] | Freq: Once | INTRAMUSCULAR | Status: AC | PRN
Start: 2023-12-08 — End: 2023-12-08
  Administered 2023-12-08: 100 [IU] via INTRAMUSCULAR

## 2023-12-08 NOTE — Procedures (Signed)
PMR Botulinumtoxin Injection    Performed by: Benita Gutter, MD  Authorized by: Benita Gutter, MD    Date/time: 12/08/2023  1:00 PM EST  Injections:  16109 - Chemodenervation 1 ext 1-4 muscles and 60454 - Chemodenervation 1 ext 1-4 muscles, 1st addtl  Medication:  100 units incobotulinumtoxin type A 100 units; 100 units incobotulinumtoxin type A 100 units; 100 units incobotulinumtoxin type A 100 units; 100 units incobotulinumtoxin type A 100 units; 100 units incobotulinumtoxin type A 100 units; 100 units incobotulinumtoxin type A 100 units   See below     Procedure Details   The risks, benefits, indications, potential complications, and alternatives were explained to the patient and/or guardian who verbalized understanding and informed consent obtained.      The area for injection was identified and a time out called to re-identify.       The correct patient was identified with 2 identifiers The correct procedure, location site(s) and laterality were identified with the patient and/or guardian and correspond to the consent form  The appropriate site was marked and verified.  The patient was placed in the correct position.     After prepping the skin with alcohol overlying the following muscles, botlinum toxin was injected intramuscularly as follows.       Right and left quadriceps (vastus lateralis 75, vastus medialis 75, vastus intermedius 75, rectus femoris 75) 300 + 300 units       Botulinum toxin Lot #: [id number] xeomin  Botulinum toxin expiration date: 11/2025  Total botox units injected: 600 units   Total botox units wasted: 0 units       The patient tolerated the procedure without complications.      Patient will follow up in 12 weeks for repeat botox injection.      Benita Gutter, MD

## 2023-12-16 ENCOUNTER — Encounter: Payer: Self-pay | Admitting: Family Medicine

## 2023-12-17 ENCOUNTER — Ambulatory Visit: Payer: Medicaid Other | Admitting: Internal Medicine

## 2023-12-20 ENCOUNTER — Other Ambulatory Visit: Payer: Self-pay

## 2023-12-21 ENCOUNTER — Encounter: Payer: Self-pay | Admitting: Primary Care

## 2023-12-21 ENCOUNTER — Other Ambulatory Visit: Payer: Self-pay

## 2023-12-21 ENCOUNTER — Ambulatory Visit: Payer: Medicare Other | Attending: Primary Care | Admitting: Primary Care

## 2023-12-21 VITALS — BP 110/90 | HR 110 | Ht 65.0 in | Wt 150.0 lb

## 2023-12-21 DIAGNOSIS — N281 Cyst of kidney, acquired: Secondary | ICD-10-CM

## 2023-12-21 DIAGNOSIS — N319 Neuromuscular dysfunction of bladder, unspecified: Secondary | ICD-10-CM

## 2023-12-21 DIAGNOSIS — L0291 Cutaneous abscess, unspecified: Secondary | ICD-10-CM | POA: Insufficient documentation

## 2023-12-21 DIAGNOSIS — R238 Other skin changes: Secondary | ICD-10-CM | POA: Insufficient documentation

## 2023-12-21 MED ORDER — CLINDAMYCIN PHOSPHATE 1 % EX LOTN *I*
TOPICAL_LOTION | Freq: Two times a day (BID) | CUTANEOUS | 2 refills | Status: DC
Start: 2023-12-21 — End: 2024-02-03
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
## 2023-12-21 NOTE — Progress Notes (Signed)
 [facility] Outpatient Progress Note:    Subjective:    Cindy Ochoa is a 37 y.o. female presenting for Follow-up and Bleeding/Bruising      HPI:    History of Present Illness  The patient is a 37 year old female who presents today for concerns of a bruise under her left leg.    She reports experiencing spasms in her legs at night, during which her right leg becomes lodged under her left leg. This has resulted in a bruise on her left leg, specifically at the point where her knee contacts the leg. She also mentions the presence of blisters on her side and breast, which appear to heal and recur every few weeks. These blisters are not painful but occasionally drain a green, sludgy substance, indicative of infection. She has been managing these symptoms with turmeric soap and peroxide. She has been applying lotion and grease to the area due to its dryness and thickness. She uses two pillows under her legs when sleeping, one of which is used to separate her knees to prevent bruising. Despite these measures, her right leg continues to slip under her left leg. She has previously consulted a dermatologist, who performed a bone biopsy and attributed her knee breakdown to a urinary tract infection. However, she has since discontinued these visits due to the absence of urinary tract infections.    She has requested a referral to an allergist, having last seen one in 2007 or 2008. She reports no known allergies. But has skin concerns and would like to be evaluated for this.               Objective:  BP 110/90 (BP Location: Right arm, Patient Position: Sitting, Cuff Size: adult)   Pulse 110   Ht 1.651 m (5\' 5" )   Wt 68 kg (150 lb)   SpO2 94%   BMI 24.96 kg/m     Vitals reviewed    Physical Exam      Vitals reviewed  General: well-appearing, NAD   Skin/Ext:  old scaring noted to bilateral knees, abscess in varying stages of healing underneath bilateral breasts and on chest, dry hyperpigmented skin to back of  left thigh.     Results        Assessment/Plan:    Assessment & Plan  1. Abscesses  - Small cyst-like formations in darker, moist areas (armpits, around breasts), potentially hidradenitis suppurativa, mother has similar abscesses   - clindamycin lotion prescribed to use as needed when abscesses appear    2. Skin irritation   - Skin irritation likely due to frequent leg crossing  - Monitor skin condition closely to prevent further deterioration  -use pillows to avoid legs crossing at night while sleeping    2. Allergy testing  - Referral to allergy immunology for further evaluation      Follow up: Follow up if symptoms worsen or fail to improve.      Fatima Sanger, FNP-C  [facility] Clinical Faculty, Department of [facility] Medicine  [facility]  [address] [place], [place] [address]  Phone: [phone]  Fax: [phone]      To my patients:  Some of my notes are dictated using voice-recognition program which may result in minor transcription errors.  If you have any urgent concerns, please contact me through MyChart.  Please bring any non-urgent concerns to your next appointment so we can discuss them.  Thank you!

## 2023-12-21 NOTE — Telephone Encounter (Signed)
Entered Renal US for patient to complete before follow up appointment if possible, per [facility] note 12/2022

## 2024-01-04 ENCOUNTER — Other Ambulatory Visit: Payer: Self-pay

## 2024-01-05 ENCOUNTER — Other Ambulatory Visit: Payer: Medicare Other

## 2024-01-07 ENCOUNTER — Encounter: Payer: Medicare Other | Admitting: Surgery

## 2024-01-10 ENCOUNTER — Other Ambulatory Visit: Payer: Self-pay

## 2024-01-11 ENCOUNTER — Ambulatory Visit: Payer: Medicare Other | Attending: Emergency Medicine | Admitting: Emergency Medicine

## 2024-01-11 ENCOUNTER — Other Ambulatory Visit: Payer: Self-pay

## 2024-01-11 ENCOUNTER — Encounter: Payer: Self-pay | Admitting: Emergency Medicine

## 2024-01-11 ENCOUNTER — Other Ambulatory Visit: Payer: Medicare Other

## 2024-01-11 VITALS — BP 103/70 | HR 86 | Temp 96.4°F | Resp 18

## 2024-01-11 DIAGNOSIS — L723 Sebaceous cyst: Secondary | ICD-10-CM | POA: Insufficient documentation

## 2024-01-11 MED ORDER — ACETAMINOPHEN 500 MG PO TABS *I*
1000.0000 mg | ORAL_TABLET | Freq: Three times a day (TID) | ORAL | 5 refills | Status: DC | PRN
Start: 2024-01-11 — End: 2024-05-16

## 2024-01-11 MED ORDER — SULFAMETHOXAZOLE-TRIMETHOPRIM 800-160 MG PO TABS *I*
1.0000 | ORAL_TABLET | Freq: Two times a day (BID) | ORAL | 0 refills | Status: AC
Start: 2024-01-11 — End: 2024-01-16
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
## 2024-01-11 NOTE — Patient Instructions (Signed)
 Continue applying warm compresses to the area every 2-3 hours    Take Tylenol 1000 mg every 8 hours for pain    Take the Bactrim as directed.  Take this with food    If you have increased pain and swelling then please follow-up

## 2024-01-12 ENCOUNTER — Ambulatory Visit: Payer: Medicare Other | Admitting: Family Medicine

## 2024-01-12 ENCOUNTER — Ambulatory Visit: Payer: Medicaid Other | Admitting: Urology

## 2024-01-13 ENCOUNTER — Ambulatory Visit: Payer: Medicare Other

## 2024-01-24 ENCOUNTER — Other Ambulatory Visit: Payer: Self-pay

## 2024-01-26 ENCOUNTER — Encounter: Payer: Self-pay | Admitting: Gastroenterology

## 2024-01-26 ENCOUNTER — Encounter: Payer: Self-pay | Admitting: Urology

## 2024-01-26 ENCOUNTER — Encounter: Payer: Self-pay | Admitting: Plastic Surgery

## 2024-01-27 ENCOUNTER — Encounter: Payer: Self-pay | Admitting: Family Medicine

## 2024-01-27 DIAGNOSIS — L0211 Cutaneous abscess of neck: Secondary | ICD-10-CM

## 2024-01-28 NOTE — Telephone Encounter (Signed)
E-consult for derm?

## 2024-01-29 NOTE — Telephone Encounter (Signed)
ECONSULT PLACED.

## 2024-01-29 NOTE — Addendum Note (Signed)
Addended by: Dorma Russell ROSE on: 01/29/2024 07:56 AM     Modules accepted: Orders

## 2024-02-02 ENCOUNTER — Other Ambulatory Visit: Payer: Medicare Other | Admitting: Student in an Organized Health Care Education/Training Program

## 2024-02-02 DIAGNOSIS — L720 Epidermal cyst: Secondary | ICD-10-CM

## 2024-02-02 NOTE — Provider Consult (Signed)
Consulting Provider Response     Dermatology eConsult    Question/History  38 y.o. year old female with recurrent abscess and drainge from lesion on neck.     MY CLINICAL QUESTION:  was seen recently in urgent care for recurrent cellulitis and abscess from a focal location on the neck - was advised to follow up with dermatology given recurrence of lesion and risk of recurrent skin infections given paraplegia.  E-consult requested by patient.     Photographs  Reviewed   Photo Quality: Adequate  Please visit the following website to see a 1-page guide for taking medical photographs:  [email or web address]      Assessment    Suspect epidermal inclusion cyst, although difficult to fully assess in the photographs provided    Plan  - Recommend patient wash the affected areas daily with OTC wash containing benzoyl peroxide (Panoxyl or generic)  - Can apply a thin layer of clindamycin 1% lotion or gel after washing  - As much as possible, patient should avoid picking or squeezing the area, which can precipitate inflammation/infection  - We can plan to see in the office routinely for closer evaluation and discussion of elective cyst excision, if appropriate    - The patient will be called to schedule an in-person Dermatology consultation.    - If the patient has a new skin concern in the future, please submit a new eConsult.     Time Spent: 5 minutes      We are now using the Waitlist and Fast Pass to help patients schedule earlier appointments when they become available. Please instruct the patient to update their MyChart account to be able to receive Fast Pass text messages. Instructions: Click Menu, scroll to Account Settings, click Communication Preferences, click Appointments, click Advanced Settings. In the [facility] Offer section, check all, including: Email, Text Message, and "Automatically sign up my appointments for earlier offers." Click Save Changes.    This  eConsult is focused on the specific clinical question(s) asked by the referring clinician, is based on the clinical data available to me, the consulting physician at the time off the request, and is furnished without benefit of a comprehensive evaluation of physical examination of the patient by me. The guidance set forth in the eConsult note will need to be interpreted in light of any clinical issues not known to me or any changes in patient status that I may not be aware of at the time of filing this eConsult. If further consultation is necessary, an in-person visit with me or another member of our group is an option.    Angelyn Punt, MD  Dermatology Attending

## 2024-02-03 ENCOUNTER — Other Ambulatory Visit: Payer: Self-pay | Admitting: Family Medicine

## 2024-02-03 ENCOUNTER — Telehealth: Payer: Self-pay | Admitting: Family Medicine

## 2024-02-03 DIAGNOSIS — L0211 Cutaneous abscess of neck: Secondary | ICD-10-CM

## 2024-02-03 MED ORDER — BENZOYL PEROXIDE 10 % EX LIQD *I*
Freq: Two times a day (BID) | CUTANEOUS | 2 refills | Status: DC
Start: 2024-02-03 — End: 2024-02-03

## 2024-02-03 MED ORDER — BENZOYL PEROXIDE 10 % EX LIQD *I*
Freq: Two times a day (BID) | CUTANEOUS | 2 refills | Status: DC
Start: 2024-02-03 — End: 2024-05-16

## 2024-02-03 MED ORDER — CLINDAMYCIN PHOSPHATE 1 % EX GEL *I*
Freq: Two times a day (BID) | CUTANEOUS | 2 refills | Status: DC
Start: 2024-02-03 — End: 2024-05-16

## 2024-02-03 NOTE — Telephone Encounter (Signed)
Received fax from [facility] regarding benzoyl peroxide 10 % LIQD external liquid. They wanted clarification if its a lotion or a wash.

## 2024-02-03 NOTE — Progress Notes (Signed)
Ordering Provider Response       See MyChart communication to patient.

## 2024-02-21 ENCOUNTER — Other Ambulatory Visit: Payer: Self-pay

## 2024-02-21 ENCOUNTER — Other Ambulatory Visit: Payer: Medicare Other

## 2024-02-21 DIAGNOSIS — R3989 Other symptoms and signs involving the genitourinary system: Secondary | ICD-10-CM

## 2024-02-22 ENCOUNTER — Ambulatory Visit: Payer: Medicare Other

## 2024-02-22 ENCOUNTER — Other Ambulatory Visit: Payer: Self-pay

## 2024-02-22 VITALS — Ht 65.0 in | Wt 150.0 lb

## 2024-02-22 DIAGNOSIS — N281 Cyst of kidney, acquired: Secondary | ICD-10-CM | POA: Insufficient documentation

## 2024-02-22 DIAGNOSIS — N319 Neuromuscular dysfunction of bladder, unspecified: Secondary | ICD-10-CM | POA: Insufficient documentation

## 2024-02-22 DIAGNOSIS — R3989 Other symptoms and signs involving the genitourinary system: Secondary | ICD-10-CM | POA: Insufficient documentation

## 2024-02-22 LAB — URINALYSIS WITH REFLEX TO MICROSCOPIC
Glucose,UA: NEGATIVE
Ketones, UA: NEGATIVE
Nitrite,UA: POSITIVE — AB
Specific Gravity,UA: 1.016 (ref 1.002–1.030)
pH,UA: 6.5 (ref 5.0–8.0)

## 2024-02-22 LAB — POCT URINALYSIS DIPSTICK
Glucose,UA POCT: NORMAL mg/dL
Ketones,UA POCT: NEGATIVE mg/dL
Leuk Esterase,UA POCT: 2 — AB
Lot #: 78306202
Nitrite,UA POCT: POSITIVE — AB
PH,UA POCT: 5 (ref 5–8)
Specific gravity,UA POCT: 1.015 (ref 1.002–1.030)

## 2024-02-22 LAB — URINE MICROSCOPIC (IQ200)
Hyaline Casts,UA: >20 /[LPF] — AB (ref 0–5)
WBC,UA: >50 /[HPF] — AB (ref 0–5)

## 2024-02-22 MED ORDER — CEPHALEXIN 250 MG PO CAPS *I*
250.0000 mg | ORAL_CAPSULE | Freq: Four times a day (QID) | ORAL | 0 refills | Status: AC
Start: 2024-02-22 — End: 2024-02-29
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
## 2024-02-24 ENCOUNTER — Other Ambulatory Visit: Payer: Self-pay

## 2024-02-24 LAB — AEROBIC CULTURE: Aerobic Culture: 0 — AB

## 2024-02-25 ENCOUNTER — Ambulatory Visit
Admission: RE | Admit: 2024-02-25 | Discharge: 2024-02-25 | Disposition: A | Discharge status: Home / Self Care | Payer: Medicare Other | Source: Ambulatory Visit | Attending: Urology | Admitting: Urology

## 2024-02-25 DIAGNOSIS — N281 Cyst of kidney, acquired: Secondary | ICD-10-CM | POA: Insufficient documentation

## 2024-02-25 DIAGNOSIS — N319 Neuromuscular dysfunction of bladder, unspecified: Secondary | ICD-10-CM | POA: Insufficient documentation

## 2024-03-01 ENCOUNTER — Encounter: Payer: Self-pay | Admitting: Family Medicine

## 2024-03-01 ENCOUNTER — Ambulatory Visit: Payer: Medicare Other | Admitting: Family Medicine

## 2024-03-01 VITALS — Ht 65.0 in | Wt 150.0 lb

## 2024-03-01 DIAGNOSIS — E119 Type 2 diabetes mellitus without complications: Secondary | ICD-10-CM

## 2024-03-01 DIAGNOSIS — G8254 Quadriplegia, C5-C7 incomplete: Secondary | ICD-10-CM

## 2024-03-01 DIAGNOSIS — G909 Disorder of the autonomic nervous system, unspecified: Secondary | ICD-10-CM

## 2024-03-01 DIAGNOSIS — D696 Thrombocytopenia, unspecified: Secondary | ICD-10-CM

## 2024-03-01 DIAGNOSIS — L98411 Non-pressure chronic ulcer of buttock limited to breakdown of skin: Secondary | ICD-10-CM

## 2024-03-01 NOTE — Progress Notes (Signed)
 Video Visit      Location of Patient: home     Location of Telemedicine Provider: hospital / clinical location     Other participants in telemedicine encounter and roles:  N/A     This is an established patient visit.         HPI     History of Present Illness  The patient presents via virtual visit for evaluation of a chronic right buttock ulcer.    She has been utilizing her current motorized scooter seat for the past 5 years, during which she has developed a persistent open sore on her right buttock. She reports some intermittent pain but expresses concern about the potential impact of the prolonged use of the same cushion on the healing process of the sore. She does report visible deterioration of the chair cushion and acknowledges a slight wearing down of the padding over time.     Patient's problem list, allergies, and medications were reviewed and updated as appropriate.  Please see the EHR for full details.     Exam and data reviewed:  Well-appearing, no acute distress  Answering questions appropriately     Assessment & Plan:     Problem List Items Addressed This Visit          High    chronic thrombocytopenia (history of HIT) (Chronic)    Relevant Orders    Comprehensive metabolic panel    CBC and differential    T2DM, no complications, DIET CONTROLLED ALONE    Relevant Orders    Hemoglobin A1c    Lipid Panel (Reflex to Direct  LDL if Triglycerides more than 400)       Medium    Autonomic dysfunction - Primary (Chronic)    Quadriparesis At C6, as a result of trauma in 2006 (Chronic)    Relevant Orders    Vitamin D       Unprioritized    Non-pressure chronic ulcer of buttock, limited to breakdown of skin     Assessment & Plan  1. Chronic right buttock ulcer.  The ulcer is likely due to immobility and pressure-related factors. There is no visible breakdown of the chair cushion, but the padding has worn down over the past 5 years. A new cushion for her motorized scooter will be requested to help with pressure  offloading and potentially improve the ulcer. The necessary paperwork will be completed and submitted to her insurance company. She is advised to send the forms electronically or in person for processing.    2. Health maintenance.  A comprehensive physical examination will be scheduled, and blood work will be ordered to ensure overall health status. She is instructed to contact Bonita Quin to arrange a 40-minute head-to-toe physical examination.          Consent was obtained from the patient to complete this video visit; including the potential for financial liability.           [name] Dakota Vanwart, DO

## 2024-03-01 NOTE — Patient Instructions (Signed)
 1. Chronic right buttock ulcer.  The ulcer is likely due to immobility and pressure-related factors. There is no visible breakdown of the chair cushion, but the padding has worn down over the past 5 years. A new cushion for her motorized scooter will be requested to help with pressure offloading and potentially improve the ulcer. The necessary paperwork will be completed and submitted to her insurance company. She is advised to send the forms electronically or in person for processing.    2. Health maintenance.  A comprehensive physical examination will be scheduled, and blood work will be ordered to ensure overall health status. She is instructed to contact Bonita Quin to arrange a 40-minute head-to-toe physical examination.

## 2024-03-11 ENCOUNTER — Encounter: Payer: Self-pay | Admitting: Gastroenterology

## 2024-03-12 ENCOUNTER — Other Ambulatory Visit: Payer: Self-pay

## 2024-03-13 ENCOUNTER — Other Ambulatory Visit: Payer: Self-pay

## 2024-03-13 ENCOUNTER — Ambulatory Visit: Payer: Medicare Other | Attending: Acute Care | Admitting: Acute Care

## 2024-03-13 VITALS — Wt 150.0 lb

## 2024-03-13 DIAGNOSIS — L732 Hidradenitis suppurativa: Secondary | ICD-10-CM | POA: Insufficient documentation

## 2024-03-13 MED ORDER — CLINDAMYCIN PHOSPHATE 1 % EX LOTN *I*
TOPICAL_LOTION | Freq: Every day | CUTANEOUS | 6 refills | Status: DC
Start: 2024-03-13 — End: 2024-05-16

## 2024-03-13 NOTE — Patient Instructions (Addendum)
 Hidradenitis Suppuritiva:  -Diagnosis and treatment options discussed  -Discussed that there is no cure for this disease but smoking cessation and weight loss may be beneficial   -Start clindamycin 1% lotion 1-2x daily to the affected areas  -Start washing affected areas at least 3 times weekly with an antibacterial wash (dial antibacterial, lever 2000, benzoyl peroxide, or hibiclens)  - Discussed if actively flaring can send mychart message and photo and can try to get her in for ILK injections      Follow up: yearly for med refill

## 2024-03-13 NOTE — Progress Notes (Signed)
 Dermatology at [facility]     Subjective     Cindy Ochoa is a 38 y.o. female who presents for New Patient Visit (Spot of concern/Epidermal inclusion cyst)  History of Present Illness  The patient presents for evaluation of recurrent abscesses on her neck.    She has been experiencing recurrent abscesses on her neck for the past 2 to 3 years, with the lesions consistently appearing in the same location. The most recent abscess on her neck was particularly severe, causing significant pain and distress. She reports no similar lesions on other parts of her neck. She also reports occasional abscesses under her armpits, which typically rupture spontaneously, negating the need for urgent care. Over the past few years, she estimates having had 2 such episodes. She reports no abscesses in the groin or breast areas. She is a diabetic and it is believed that these abscesses are due to high blood sugar levels. She has had abscesses under her breast but never on her ear or neck. She has previously consulted with the plastic surgery department regarding the possibility of surgical intervention to remove the recurrent infection pocket in her neck. However, her physician has since left the practice, and she is now seeking care at this office. She occasionally smokes Black and Mild cigars when she is stressed. She uses Dove body wash for personal hygiene, occasionally alternating with other brands. She has a history of neck surgery and tracheostomy.    Supplemental Information  She gets Botox injections in her legs for spasms.    SOCIAL HISTORY  She occasionally smokes Black and Mild cigars when she is stressed.    FAMILY HISTORY  Her mother has a similar condition, possibly hidradenitis suppurativa.       Objective   Weight 68 kg (150 lb).  Physical Exam  - left neck with atrophic scar, no active draining or indurated nodule  -bilateral axilla with scaring and hyperpigmentation,  no active draining or indurated nodule         Assessment  & Plan    Hidradenitis Suppuritiva: Hurley stage II  -Diagnosis and treatment options discussed  -Discussed that there is no cure for this disease but smoking cessation and weight loss may be beneficial   -Start clindamycin 1% lotion 1-2x daily to the affected areas  -Start washing affected areas at least 3 times weekly with an antibacterial wash (dial antibacterial, lever 2000, benzoyl peroxide, or hibiclens)  - Discussed if actively flaring can send mychart message and photo and can try to get her in for ILK injections      Follow up: yearly for med refill        Author: Soledad Gerlach, NP  Note signed: 03/13/2024

## 2024-03-14 ENCOUNTER — Telehealth: Payer: Self-pay

## 2024-03-14 ENCOUNTER — Other Ambulatory Visit: Payer: Self-pay

## 2024-03-14 NOTE — Telephone Encounter (Signed)
 Attempted to call patient to remind her of her appointment with Dr. Nada Maclachlan on 03/15/2024 at 1330. Left message on VM with appointment details and call back number.

## 2024-03-15 ENCOUNTER — Ambulatory Visit
Payer: Medicaid Other | Attending: Physical Medicine and Rehabilitation | Admitting: Physical Medicine and Rehabilitation

## 2024-03-15 VITALS — BP 116/62 | HR 100 | Temp 97.3°F | Ht 65.0 in | Wt 150.0 lb

## 2024-03-15 DIAGNOSIS — M62838 Other muscle spasm: Secondary | ICD-10-CM | POA: Insufficient documentation

## 2024-03-15 MED ORDER — INCOBOTULINUMTOXINA 100 UNITS IM SOLR *I*
100.0000 [IU] | Freq: Once | INTRAMUSCULAR | Status: AC | PRN
Start: 2024-03-15 — End: 2024-03-15
  Administered 2024-03-15: 100 [IU] via INTRAMUSCULAR

## 2024-03-15 NOTE — Procedures (Signed)
 PMR Botox Injections    Performed by: Benita Gutter, MD  Authorized by: Benita Gutter, MD    Date/time: 03/15/2024  1:30 PM EDT  Injections:  29562 - Chemodenervation 1 ext 1-4 muscles and 13086 - Chemodenervation 1 ext 1-4 muscles, 1st addtl  Medication:  100 units incobotulinumtoxin type A 100 units; 100 units incobotulinumtoxin type A 100 units; 100 units incobotulinumtoxin type A 100 units; 100 units incobotulinumtoxin type A 100 units; 100 units incobotulinumtoxin type A 100 units; 100 units incobotulinumtoxin type A 100 units   See below     Procedure Details   The risks, benefits, indications, potential complications, and alternatives were explained to the patient and/or guardian who verbalized understanding and informed consent obtained.      The area for injection was identified and a time out called to re-identify.       The correct patient was identified with 2 identifiers The correct procedure, location site(s) and laterality were identified with the patient and/or guardian and correspond to the consent form  The appropriate site was marked and verified.  The patient was placed in the correct position.     After prepping the skin with alcohol overlying the following muscles, botlinum toxin was injected intramuscularly as follows.       Right and left quadriceps (vastus lateralis 75, vastus medialis 75, vastus intermedius 75, rectus femoris 75) 300 + 300 units       Botulinum toxin Lot #: [id number] xeomin  Botulinum toxin expiration date: 07/2026  Total botox units injected: 600 units   Total botox units wasted: 0 units       The patient tolerated the procedure without complications.      Patient will follow up in 12 weeks for repeat botox injection.      Benita Gutter, MD

## 2024-03-16 ENCOUNTER — Encounter: Payer: Self-pay | Admitting: Gastroenterology

## 2024-03-20 ENCOUNTER — Encounter: Payer: Self-pay | Admitting: Gastroenterology

## 2024-03-29 ENCOUNTER — Encounter: Payer: Self-pay | Admitting: Acute Care

## 2024-03-29 NOTE — Telephone Encounter (Signed)
 This patient attachment is clinically relevant.  Please keep in the patient's chart.    []  Document  [x]  Photo - 2    Brief attachment description: [place]      Thank you,  Lessie Dings, LPN

## 2024-03-31 ENCOUNTER — Other Ambulatory Visit: Payer: Self-pay | Admitting: Acute Care

## 2024-03-31 MED ORDER — DOXYCYCLINE HYCLATE 100 MG PO CAPS *I*
100.0000 mg | ORAL_CAPSULE | Freq: Two times a day (BID) | ORAL | 0 refills | Status: DC
Start: 2024-03-31 — End: 2024-05-16

## 2024-04-12 ENCOUNTER — Ambulatory Visit: Admitting: Family Medicine

## 2024-04-20 ENCOUNTER — Other Ambulatory Visit: Payer: Self-pay | Admitting: Family Medicine

## 2024-04-20 ENCOUNTER — Encounter: Payer: Self-pay | Admitting: Gastroenterology

## 2024-04-20 DIAGNOSIS — N3941 Urge incontinence: Secondary | ICD-10-CM

## 2024-04-20 NOTE — Telephone Encounter (Signed)
 Last appt:12/21/2023    Next appt:05/15/2024

## 2024-04-21 MED ORDER — GENERIC DME *A*
5 refills | Status: AC
Start: 2024-04-21 — End: ?

## 2024-04-21 MED ORDER — GENERIC DME *A*
5 refills | Status: DC
Start: 2024-04-21 — End: 2024-04-21

## 2024-04-21 NOTE — Addendum Note (Signed)
 Addended by: Rhodie Cienfuegos on: 04/21/2024 09:27 AM     Modules accepted: Orders

## 2024-04-24 ENCOUNTER — Encounter: Payer: Self-pay | Admitting: Gastroenterology

## 2024-04-28 ENCOUNTER — Encounter: Payer: Self-pay | Admitting: Gastroenterology

## 2024-05-10 ENCOUNTER — Other Ambulatory Visit
Admission: RE | Admit: 2024-05-10 | Discharge: 2024-05-10 | Disposition: A | Discharge status: Home / Self Care | Source: Ambulatory Visit | Attending: Family Medicine | Admitting: Family Medicine

## 2024-05-10 DIAGNOSIS — E119 Type 2 diabetes mellitus without complications: Secondary | ICD-10-CM | POA: Insufficient documentation

## 2024-05-10 DIAGNOSIS — D696 Thrombocytopenia, unspecified: Secondary | ICD-10-CM | POA: Insufficient documentation

## 2024-05-10 DIAGNOSIS — G8254 Quadriplegia, C5-C7 incomplete: Secondary | ICD-10-CM | POA: Insufficient documentation

## 2024-05-10 LAB — CBC AND DIFFERENTIAL
Baso # K/uL: 0 10*3/uL (ref 0.0–0.2)
Eos # K/uL: 0.1 10*3/uL (ref 0.0–0.5)
Hematocrit: 43 % (ref 34–49)
Hemoglobin: 13.8 g/dL (ref 11.2–16.0)
IMM Granulocytes #: 0 10*3/uL (ref 0.0–0.0)
IMM Granulocytes: 0.2 %
Lymph # K/uL: 2.5 10*3/uL (ref 1.0–5.0)
MCV: 94 fL (ref 75–100)
Mono # K/uL: 0.3 10*3/uL (ref 0.1–1.0)
Neut # K/uL: 3.3 10*3/uL (ref 1.5–6.5)
Nucl RBC # K/uL: 0 10*3/uL (ref 0.0–0.0)
Nucl RBC %: 0 /100{WBCs} (ref 0.0–0.2)
Platelets: 104 10*3/uL — ABNORMAL LOW (ref 150–450)
RBC: 4.6 MIL/uL (ref 4.0–5.5)
RDW: 14.7 % (ref 0.0–15.0)
Seg Neut %: 53 %
WBC: 6.1 10*3/uL (ref 3.5–11.0)

## 2024-05-10 LAB — COMPREHENSIVE METABOLIC PANEL
ALT: 15 U/L (ref 0–35)
AST: 12 U/L (ref 0–35)
Albumin: 4.2 g/dL (ref 3.5–5.2)
Alk Phos: 79 U/L (ref 35–105)
Anion Gap: 14 (ref 7–16)
Bilirubin,Total: 0.3 mg/dL (ref 0.0–1.2)
CO2: 21 mmol/L (ref 20–28)
Calcium: 9.6 mg/dL (ref 8.8–10.2)
Chloride: 107 mmol/L (ref 96–108)
Creatinine: 0.39 mg/dL — ABNORMAL LOW (ref 0.51–0.95)
Glucose: 118 mg/dL — ABNORMAL HIGH (ref 60–99)
Lab: 12 mg/dL (ref 6–20)
Potassium: 3.8 mmol/L (ref 3.3–5.1)
Sodium: 142 mmol/L (ref 133–145)
Total Protein: 7.2 g/dL (ref 6.3–7.7)
eGFR BY CREAT: 130 *

## 2024-05-10 LAB — LIPID PANEL
Chol/HDL Ratio: 3.4
Cholesterol: 110 mg/dL
HDL: 32 mg/dL — ABNORMAL LOW (ref 40–60)
LDL Calculated: 62 mg/dL
Non HDL Cholesterol: 78 mg/dL
Triglycerides: 77 mg/dL

## 2024-05-10 LAB — VITAMIN D: 25-OH Vit Total: 8 ng/mL — ABNORMAL LOW (ref 30–60)

## 2024-05-11 LAB — HEMOGLOBIN A1C: Hemoglobin A1C: 7.2 % — ABNORMAL HIGH

## 2024-05-12 ENCOUNTER — Ambulatory Visit: Payer: Self-pay | Admitting: Family Medicine

## 2024-05-15 ENCOUNTER — Ambulatory Visit: Admitting: Family Medicine

## 2024-05-16 ENCOUNTER — Other Ambulatory Visit: Payer: Self-pay

## 2024-05-16 ENCOUNTER — Encounter: Payer: Self-pay | Admitting: Family Medicine

## 2024-05-16 ENCOUNTER — Ambulatory Visit: Attending: Family Medicine | Admitting: Family Medicine

## 2024-05-16 VITALS — BP 94/62 | HR 90 | Ht 65.0 in

## 2024-05-16 DIAGNOSIS — M810 Age-related osteoporosis without current pathological fracture: Secondary | ICD-10-CM

## 2024-05-16 DIAGNOSIS — G43829 Menstrual migraine, not intractable, without status migrainosus: Secondary | ICD-10-CM | POA: Insufficient documentation

## 2024-05-16 DIAGNOSIS — H4900 Third [oculomotor] nerve palsy, unspecified eye: Secondary | ICD-10-CM | POA: Insufficient documentation

## 2024-05-16 DIAGNOSIS — H04123 Dry eye syndrome of bilateral lacrimal glands: Secondary | ICD-10-CM | POA: Insufficient documentation

## 2024-05-16 DIAGNOSIS — M818 Other osteoporosis without current pathological fracture: Secondary | ICD-10-CM | POA: Insufficient documentation

## 2024-05-16 DIAGNOSIS — E119 Type 2 diabetes mellitus without complications: Secondary | ICD-10-CM | POA: Insufficient documentation

## 2024-05-16 DIAGNOSIS — K5909 Other constipation: Secondary | ICD-10-CM | POA: Insufficient documentation

## 2024-05-16 DIAGNOSIS — Z22322 Carrier or suspected carrier of Methicillin resistant Staphylococcus aureus: Secondary | ICD-10-CM

## 2024-05-16 DIAGNOSIS — L732 Hidradenitis suppurativa: Secondary | ICD-10-CM

## 2024-05-16 DIAGNOSIS — N760 Acute vaginitis: Secondary | ICD-10-CM | POA: Insufficient documentation

## 2024-05-16 DIAGNOSIS — Z0000 Encounter for general adult medical examination without abnormal findings: Secondary | ICD-10-CM | POA: Insufficient documentation

## 2024-05-16 DIAGNOSIS — K592 Neurogenic bowel, not elsewhere classified: Secondary | ICD-10-CM | POA: Insufficient documentation

## 2024-05-16 LAB — MICROALBUMIN, URINE, RANDOM
Creatinine,UR: 125 mg/dL (ref 20–300)
Microalb/Creat Ratio: 404.2 mg MA/g CR — ABNORMAL HIGH (ref 0.0–29.9)
Microalbumin,UR: 50.53 mg/dL

## 2024-05-16 MED ORDER — METHYLCELLULOSE (LAXATIVE) 500 MG PO TABS *A*
1.0000 g | ORAL_TABLET | Freq: Every morning | ORAL | 4 refills | Status: DC
Start: 2024-05-16 — End: 2024-05-23
  Filled 2024-05-16: qty 180, 90d supply, fill #0

## 2024-05-16 MED ORDER — CARBOXYMETHYLCELLULOSE SODIUM 0.5 % OP SOLN WRAPPED *I*
1.0000 [drp] | Freq: Three times a day (TID) | OPHTHALMIC | 3 refills | Status: AC | PRN
Start: 2024-05-16 — End: ?
  Filled 2024-05-16: qty 60, 20d supply, fill #0
  Filled 2024-06-07 (×2): qty 60, 20d supply, fill #1

## 2024-05-16 MED ORDER — ACETAMINOPHEN 500 MG PO TABS *I*
1000.0000 mg | ORAL_TABLET | Freq: Three times a day (TID) | ORAL | 5 refills | Status: AC | PRN
Start: 2024-05-16 — End: ?
  Filled 2024-05-16: qty 30, 5d supply, fill #0
  Filled 2024-06-07 (×2): qty 30, 5d supply, fill #1
  Filled 2024-11-27: qty 30, 5d supply, fill #2

## 2024-05-16 MED ORDER — BISACODYL 10 MG RE SUPP *I*
10.0000 mg | RECTAL | 2 refills | Status: AC
Start: 2024-05-16 — End: ?

## 2024-05-16 MED ORDER — DOXYCYCLINE HYCLATE 50 MG PO CAPS *I*
50.0000 mg | ORAL_CAPSULE | ORAL | 2 refills | Status: DC
Start: 2024-05-16 — End: 2024-08-01
  Filled 2024-05-16: qty 90, 90d supply, fill #0
  Filled 2024-05-17: qty 28, 28d supply, fill #0
  Filled 2024-06-07 (×2): qty 28, 28d supply, fill #1
  Filled 2024-07-04: qty 28, 28d supply, fill #2

## 2024-05-16 MED ORDER — METFORMIN HCL 500 MG PO TB24 *I*
500.0000 mg | ORAL_TABLET | ORAL | 14 refills | Status: AC
  Filled 2024-05-16: qty 90, 90d supply, fill #0
  Filled 2024-05-17: qty 28, 28d supply, fill #0
  Filled 2024-06-07 (×2): qty 28, 28d supply, fill #1
  Filled 2024-07-04: qty 28, 28d supply, fill #2
  Filled 2024-08-01: qty 28, 28d supply, fill #3
  Filled 2024-08-30: qty 28, 28d supply, fill #4
  Filled 2024-09-26: qty 28, 28d supply, fill #5
  Filled 2024-10-24: qty 28, 28d supply, fill #6
  Filled 2024-11-20: qty 28, 28d supply, fill #7
  Filled 2024-12-19: qty 28, 28d supply, fill #8
  Filled 2025-01-16: qty 28, 28d supply, fill #9

## 2024-05-16 MED ORDER — MUPIROCIN 2 % EX OINT *I*
TOPICAL_OINTMENT | Freq: Two times a day (BID) | CUTANEOUS | 4 refills | Status: AC
Start: 2024-05-16 — End: 2024-05-30
  Filled 2024-05-16: qty 22, 7d supply, fill #0

## 2024-05-16 MED ORDER — SENNOSIDES 8.6 MG PO TABS *I*
1.0000 | ORAL_TABLET | Freq: Every evening | ORAL | 0 refills | Status: AC
Start: 2024-05-16 — End: 2024-06-15

## 2024-05-16 NOTE — Progress Notes (Unsigned)
 [facility] Outpatient Progress Note:    Subjective:    Cindy Ochoa is a 38 y.o. female presenting for Subsequent Annual Medicare Visit and Annual Exam      HPI:    History of Present Illness  The patient is a 38 year old female who presents for a Medicare sub wellness exam and review of chronic conditions. 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 She is accompanied by her mother.    This morning, she experienced the rupture of a cyst on her back, which was covered with a Band-Aid. The cyst discharged green and white fluid. She has been under the care of a dermatologist, who recommended steroid injections for her abscesses. However, she prefers to seek treatment at an urgent care facility due to its proximity. She also reports the presence of abscesses in her groin area. She has been prescribed doxycycline  100 mg twice daily from dermatology but one set of instructions told her to take the medicine for 7 days only and the medication got filled with 60 tablets.  This made her concerned so she took a few tabs for a few days but then discontinued once symptoms began to improve.  Dermatology told her she could return in the future for subsequent abscesses or boil formation and receive intralesional steroid injections.  She feels a little uncomfortable with this plan and doesn't understand why a steroid would be used to treat an infection.    She reports excessive yellowish discharge from her vagina, which she believes is causing irritation to her buttocks. She missed an appointment with her OB/GYN last year due to an emergency and has not been able to reschedule. She is not sexually active.    She has been experiencing constipation, with bowel movements occurring only once every few weeks. Today marks her second bowel movement in several weeks, which was small in volume. She has discontinued the use of stool softeners due to associated heartburn. She has also stopped taking Peri-Colace due to heartburn  and belching. She is currently taking Citrucel 1 tablet daily and using suppositories every other Cindy Ochoa. She has previously tried MiraLAX  without success.    She has been consuming large quantities of soda and fruit, which she believes may have contributed to an increase in her A1c levels to 7.2.    She has been experiencing blurry vision in her one good eye lately and is out of eye drops.    ROS: As stated above    Objective:  BP 94/62 (BP Location: Right arm, Patient Position: Sitting, Cuff Size: adult)   Pulse 90   Ht 1.651 m (5\' 5" )   SpO2 97%   BMI 24.96 kg/m     Vitals, Medical/Surgical Histories, and Recent Care Notes reviewed  Physical Exam  Gastrointestinal: Large external hemorrhoid present, not irritated, no discharge or drainage.  Skin: skin abscess appreacited on back draining scant serosanguinous drainage, no warmth/induration/fluctuance noted, not tenderness due to baseline sensation.  Gen: well-appearing, well-nourished, NAD   HEENT: sclera anicteric, conjugate gaze, no conjunctival injection   Cor: RRR S1 S2, no pedal edema  Pulm: Breathing comfortably on room air, speaking in full sentences  Skin: no notable rashes/erythema/lesions on exposed skin  Psych: casually dressed, affect full range/congruent with mood/appropriate, good eye contact, no motor tics, speech volume/rate/content appropriate    Assessment/Plan:    Problem List Items Addressed This Visit          High    T2DM, no complications    HbA1c was  last checked on 05/10/2024 and was 7.2% which is not at goal of <7. Per our records the patient is UTD on diabetic eye exam. Patient's diabetic foot exam is overdue, and exam was performed today Patient is overdue for microalbuminuria screening, an order was placed.      Cindy Ochoa would likely benefit from a low dose metformin  daily which would probably help with chronic constipation as well.  She is amenable to this plan.      Diabetic medication regimen as of TODAY is as follows:   Current  Diabetes Medications              metFORMIN  (GLUCOPHAGE -XR) 500 mg 24 hr tablet Take 1 tablet (500 mg total) by mouth daily (with dinner). Swallow whole. Do not crush, break, or chew.                   Relevant Medications    metFORMIN  (GLUCOPHAGE -XR) 500 mg 24 hr tablet    Other Relevant Orders    Microalbumin, Urine, Random (Completed)    Neurogenic bowel (Chronic)    Relevant Medications    senna (SENOKOT) 8.6 mg tablet    bisacodyl  (DULCOLAX) 10 mg suppository    methylcellulose (CITRUCEL) 500 mg TABS tablet    Other Relevant Orders    Consult to Amb Rx Med Management       Medium    Osteoporosis of disuse (Chronic)     I think Cindy Ochoa may benefit from a consultation with the metabolic bone clinic but due to history of GERD and mixed evidence on efficacy of bisphosphonates in paraplegia, I recommend high dose vitamin D  and routine monitoring with repeat dexa next April to see if there is clinical worsening of the condition.         Relevant Medications    ergocalciferol  50,000 unit capsule    Other Relevant Orders    Vitamin D     Constipation, chronic (Chronic)     - The patient experiences severe constipation and has discontinued previous medications due to heartburn.  - Physical examination and patient history confirm constipation.  - Reviewed the patient's medication history and discussed alternative treatments.  - A prescription for Senokot 8.6 mg tablet nightly and Citrucel 500 mg tablet every morning will be provided. The use of Colace will be discontinued due to heartburn. Bisacodyl  suppository will be used every other [employer] as needed.         Relevant Medications    senna (SENOKOT) 8.6 mg tablet    bisacodyl  (DULCOLAX) 10 mg suppository    methylcellulose (CITRUCEL) 500 mg TABS tablet       Unprioritized    MRSA (methicillin resistant Staphylococcus aureus) carrier     - The patient experiences recurrent skin abscesses, likely due to MRSA colonization.  - Physical examination revealed draining abscesses with  resolving infection.  - Discussed the possibility of MRSA colonization and the need for eradication therapy.  - A regimen of doxycycline  50 mg daily for 3 months is recommended for prevention of further abscess formation.  - Additionally, Bactroban  ointment should be applied nasally twice daily for 2 weeks to eradicate MRSA colonization.   - If another boil occurs despite these interventions, the dosage of doxycycline  will be increased to 100 mg daily.         Relevant Medications    doxycycline  hyclate (VIBRAMYCIN ) 50 mg capsule    mupirocin  (BACTROBAN ) 2 % ointment    Hidradenitis suppurativa    Relevant Medications  doxycycline  hyclate (VIBRAMYCIN ) 50 mg capsule    mupirocin  (BACTROBAN ) 2 % ointment       Low    Oculomotor palsy, partial (Chronic)    Relevant Medications    carboxymethylcellulose (REFRESH PLUS) 0.5 % ophthalmic solution    Headache, menstrual migraine    Relevant Medications    acetaminophen  (TYLENOL ) 500 mg tablet    Dry eyes     - Patient history indicates recent issues with blurry vision.  - Discussed the potential causes and the need for symptom relief.  - Refresh eyedrops will be prescribed and sent to [facility].          Other Visit Diagnoses         Preventative health care    -  Primary    Relevant Orders    Microalbumin, Urine, Random (Completed)      Acute vaginitis        Relevant Medications    metroNIDAZOLE  (FLAGYL ) 500 mg tablet    fluconazole  (DIFLUCAN ) 150 mg tablet          Assessment & Plan  Bacterial vaginosis.                  Follow up: Follow up in about 3 months (around 08/16/2024) for follow up constipation, diabetes.      Cindy Ochoa Cindy Wiley, DO  [facility] Clinical Faculty, Department of [facility] Medicine  [facility]  [address], [place], [place] [address]  Phone: [phone]  Fax: [phone]        To my patients:  Some of my notes are dictated using voice-recognition program which may result in minor transcription errors.  If you have any urgent  concerns, please contact me through MyChart.  Please bring any non-urgent concerns to your next appointment so we can discuss them.  Thank you!

## 2024-05-16 NOTE — Assessment & Plan Note (Signed)
 HbA1c was last checked on 05/10/2024 and was 7.2% which is not at goal of <7. Per our records the patient is UTD on diabetic eye exam. Patient's diabetic foot exam is overdue, and exam was performed today Patient is overdue for microalbuminuria screening, an order was placed.      Cindy Ochoa would likely benefit from a low dose metformin  daily which would probably help with chronic constipation as well.  She is amenable to this plan.      Diabetic medication regimen as of TODAY is as follows:   Current Diabetes Medications              metFORMIN  (GLUCOPHAGE -XR) 500 mg 24 hr tablet Take 1 tablet (500 mg total) by mouth daily (with dinner). Swallow whole. Do not crush, break, or chew.

## 2024-05-16 NOTE — Patient Instructions (Signed)
 Thank you for completing your Subsequent Annual Medicare Visit and Annual Exam   with us  today.     The purpose of this visits was to:    Screen for disease  Assess risk of future medical problems  Help develop a healthy lifestyle  Update vaccines  Get to know your doctor in case of an illness    Patient Care Team:  Haden Suder, Hervey Loser, DO as PCP - General (Primary Care)  Rexann Catalan, Lyndy Santos, NP as PCP - [facility] (Obstetrics and Gynecology)  Cherylin Corrigan, MD (Bariatric Surgery)  Henrietta Lofts, NP as Nurse Practitioner (Urology)  Dellanno, Karie Ou, OD (Ophthalmology)  Mardell Shade, MD (Physical Medicine and Rehabilitation)  Devoria Font, NP (Plastic Surgery)  Volo, Ronnell Coins, NP (Surgery)  Henrietta Lofts, NP as Nurse Practitioner (Urology)     Medicare 5 Year Plan    The following items were identified as areas of concern during your screening today:       The Health Maintenance table below identifies screening tests and immunizations recommended by your health care team:  Health Maintenance: These screening recommendations are based on [place]PSTF, [employer], and [place] state guidelines   Topic Date Due   . Diabetic Foot Exam  01/19/2024   . Diabetic Kidney Disease Urine  04/13/2024   . Diabetic A1C Monitoring  08/10/2024   . Diabetic Eye Exam  10/26/2024   . Diabetic Kidney Disease Blood  05/10/2025   . Depression - Yearly  05/16/2025   . Cervical Cancer Screening / HPV Testing  05/29/2026   . DTaP/Tdap/Td Vaccines (2 - Td or Tdap) 09/19/2028   . Pneumococcal Vaccination (3 of 3 - PCV20 or PCV21) 07/18/2035   . Flu Shot  Completed   . HIV Screening  Completed   . Hepatitis C Screening  Completed   . HIB Vaccine  Aged Out   . HPV Vaccine  Aged Out   . Meningococcal Vaccine  Aged Out   . Rotavirus Vaccine  Aged Out   . Meningitis Vaccine  Aged Out   . Hepatitis B Vaccine  Discontinued   . COVID-19 Vaccine  Discontinued     In addition, goals and orders placed to address these recommendations are listed  in the "Today's Visit" section.    We wish you the best of health and look forward to seeing you again next year for your Annual Medicare Wellness Visit.     If you have any health care concerns before then, please do not hesitate to contact us .

## 2024-05-16 NOTE — Progress Notes (Unsigned)
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 provided to [facility] in the after visit summary.     Plan for any concerns identified during screening or risk assessments:  N/A

## 2024-05-16 NOTE — Assessment & Plan Note (Addendum)
 I think Cindy Ochoa may benefit from a consultation with the metabolic bone clinic but due to history of GERD and mixed evidence on efficacy of bisphosphonates in paraplegia, I recommend high dose vitamin D  and routine monitoring with repeat dexa next April to see if there is clinical worsening of the condition.

## 2024-05-17 ENCOUNTER — Ambulatory Visit: Payer: Self-pay | Admitting: Pharmacist

## 2024-05-17 ENCOUNTER — Other Ambulatory Visit: Payer: Self-pay

## 2024-05-17 DIAGNOSIS — E119 Type 2 diabetes mellitus without complications: Secondary | ICD-10-CM

## 2024-05-17 DIAGNOSIS — N760 Acute vaginitis: Secondary | ICD-10-CM

## 2024-05-17 DIAGNOSIS — M818 Other osteoporosis without current pathological fracture: Secondary | ICD-10-CM

## 2024-05-17 MED ORDER — FLUCONAZOLE 150 MG PO TABS *I*
150.0000 mg | ORAL_TABLET | Freq: Every evening | ORAL | 0 refills | Status: AC
Start: 2024-05-17 — End: 2024-05-18
  Filled 2024-05-17: qty 1, 1d supply, fill #0

## 2024-05-17 MED ORDER — ERGOCALCIFEROL 50000 UNIT PO CAPS *I*
50000.0000 [IU] | ORAL_CAPSULE | ORAL | 5 refills | Status: DC
Start: 2024-05-17 — End: 2024-10-24
  Filled 2024-05-17: qty 4, 28d supply, fill #0
  Filled 2024-06-07 (×2): qty 4, 28d supply, fill #1
  Filled 2024-07-04: qty 4, 28d supply, fill #2
  Filled 2024-08-01: qty 4, 28d supply, fill #3
  Filled 2024-08-30: qty 4, 28d supply, fill #4
  Filled 2024-09-26: qty 4, 28d supply, fill #5

## 2024-05-17 MED ORDER — METRONIDAZOLE 500 MG PO TABS *I*
500.0000 mg | ORAL_TABLET | Freq: Two times a day (BID) | ORAL | 0 refills | Status: AC
Start: 2024-05-17 — End: 2024-05-24
  Filled 2024-05-17: qty 14, 7d supply, fill #0

## 2024-05-17 NOTE — Assessment & Plan Note (Signed)
-   The patient experiences severe constipation and has discontinued previous medications due to heartburn.  - Physical examination and patient history confirm constipation.  - Reviewed the patient's medication history and discussed alternative treatments.  - A prescription for Senokot 8.6 mg tablet nightly and Citrucel 500 mg tablet every morning will be provided. The use of Colace will be discontinued due to heartburn. Bisacodyl  suppository will be used every other [place] as needed.

## 2024-05-17 NOTE — Assessment & Plan Note (Addendum)
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  - Physical examination revealed draining abscesses with resolving infection.  - Discussed the possibility of MRSA colonization and the need for eradication therapy.  - A regimen of doxycycline  50 mg daily for 3 months is recommended for prevention of further abscess formation.  - Additionally, Bactroban  ointment should be applied nasally twice daily for 2 weeks to eradicate MRSA colonization.   - If another boil occurs despite these interventions, the dosage of doxycycline  will be increased to 100 mg daily.

## 2024-05-17 NOTE — Assessment & Plan Note (Signed)
-   Patient history indicates recent issues with blurry vision.  - Discussed the potential causes and the need for symptom relief.  - Refresh eyedrops will be prescribed and sent to [facility].

## 2024-05-18 ENCOUNTER — Telehealth: Payer: Self-pay | Admitting: Family Medicine

## 2024-05-18 ENCOUNTER — Other Ambulatory Visit: Payer: Self-pay

## 2024-05-18 ENCOUNTER — Ambulatory Visit: Payer: Self-pay | Admitting: Pharmacist

## 2024-05-18 NOTE — Telephone Encounter (Signed)
 Pharmacy reaching out regarding Citrucel prescription received.  This medication is not covered by medicaid.  Pharmacy requesting alternative.     Thank you!

## 2024-05-18 NOTE — Telephone Encounter (Signed)
 Has tried a few others but unable to tolerate due to heartburn and belching, would you like us  to try for prior authorization? Its probably not covered because it can be brought over the counter.

## 2024-05-19 ENCOUNTER — Other Ambulatory Visit: Payer: Self-pay

## 2024-05-19 NOTE — Telephone Encounter (Addendum)
 Left message that Citrucel has to be purchased over the counter as insurance does not cover it.  Pharmacy made aware as well

## 2024-05-23 ENCOUNTER — Ambulatory Visit: Payer: Self-pay

## 2024-05-23 ENCOUNTER — Other Ambulatory Visit: Payer: Self-pay

## 2024-05-29 ENCOUNTER — Other Ambulatory Visit: Payer: Self-pay

## 2024-06-07 ENCOUNTER — Ambulatory Visit: Payer: Self-pay | Admitting: Pharmacist

## 2024-06-07 ENCOUNTER — Other Ambulatory Visit: Payer: Self-pay

## 2024-06-07 DIAGNOSIS — M818 Other osteoporosis without current pathological fracture: Secondary | ICD-10-CM

## 2024-06-07 DIAGNOSIS — L732 Hidradenitis suppurativa: Secondary | ICD-10-CM

## 2024-06-07 DIAGNOSIS — G43829 Menstrual migraine, not intractable, without status migrainosus: Secondary | ICD-10-CM

## 2024-06-07 DIAGNOSIS — E119 Type 2 diabetes mellitus without complications: Secondary | ICD-10-CM

## 2024-06-07 DIAGNOSIS — Z22322 Carrier or suspected carrier of Methicillin resistant Staphylococcus aureus: Secondary | ICD-10-CM

## 2024-06-07 DIAGNOSIS — H4900 Third [oculomotor] nerve palsy, unspecified eye: Secondary | ICD-10-CM

## 2024-06-08 ENCOUNTER — Other Ambulatory Visit: Payer: Self-pay

## 2024-06-09 ENCOUNTER — Other Ambulatory Visit: Payer: Self-pay

## 2024-06-12 ENCOUNTER — Other Ambulatory Visit: Payer: Self-pay

## 2024-06-13 ENCOUNTER — Other Ambulatory Visit: Payer: Self-pay

## 2024-06-13 ENCOUNTER — Telehealth: Payer: Self-pay

## 2024-06-13 NOTE — Nurse Navigator (Signed)
 Attempted to call patient to remind them of their injection appointment with Dr. Mardell Shade tomorrow, 06/14/2024 at 1500. Left message on VM with appointment details, office location & address, and call back number, encouraging patient to call back with questions or transportation needs.

## 2024-06-14 ENCOUNTER — Other Ambulatory Visit: Payer: Self-pay

## 2024-06-14 ENCOUNTER — Ambulatory Visit: Admitting: Physical Medicine and Rehabilitation

## 2024-06-16 ENCOUNTER — Other Ambulatory Visit: Payer: Self-pay

## 2024-06-19 ENCOUNTER — Encounter: Payer: Self-pay | Admitting: Pharmacist

## 2024-06-19 ENCOUNTER — Other Ambulatory Visit: Payer: Self-pay

## 2024-06-20 ENCOUNTER — Ambulatory Visit: Payer: Self-pay | Admitting: Pharmacist

## 2024-06-20 ENCOUNTER — Other Ambulatory Visit: Payer: Self-pay

## 2024-06-22 ENCOUNTER — Encounter: Payer: Self-pay | Admitting: Gastroenterology

## 2024-06-26 ENCOUNTER — Encounter: Payer: Self-pay | Admitting: Gastroenterology

## 2024-06-28 ENCOUNTER — Telehealth: Payer: Self-pay | Admitting: Family Medicine

## 2024-06-28 NOTE — Telephone Encounter (Signed)
 FYI  Pt switched manages care plans so Darice from [employer] health plan will be closing out with her. She switched to [facility].

## 2024-07-03 ENCOUNTER — Encounter: Payer: Self-pay | Admitting: Optometry

## 2024-07-04 ENCOUNTER — Other Ambulatory Visit: Payer: Self-pay

## 2024-07-04 DIAGNOSIS — E119 Type 2 diabetes mellitus without complications: Secondary | ICD-10-CM

## 2024-07-04 DIAGNOSIS — L732 Hidradenitis suppurativa: Secondary | ICD-10-CM

## 2024-07-04 DIAGNOSIS — Z22322 Carrier or suspected carrier of Methicillin resistant Staphylococcus aureus: Secondary | ICD-10-CM

## 2024-07-04 DIAGNOSIS — M818 Other osteoporosis without current pathological fracture: Secondary | ICD-10-CM

## 2024-07-06 ENCOUNTER — Other Ambulatory Visit: Payer: Self-pay

## 2024-07-06 ENCOUNTER — Ambulatory Visit: Payer: Self-pay

## 2024-07-07 ENCOUNTER — Other Ambulatory Visit: Payer: Self-pay

## 2024-07-10 ENCOUNTER — Other Ambulatory Visit: Payer: Self-pay

## 2024-07-11 ENCOUNTER — Other Ambulatory Visit: Payer: Self-pay

## 2024-07-11 ENCOUNTER — Ambulatory Visit: Admitting: Optometry

## 2024-07-17 ENCOUNTER — Ambulatory Visit: Admitting: Optometry

## 2024-07-17 ENCOUNTER — Other Ambulatory Visit: Payer: Self-pay

## 2024-08-01 ENCOUNTER — Other Ambulatory Visit: Payer: Self-pay | Admitting: Family Medicine

## 2024-08-01 ENCOUNTER — Other Ambulatory Visit: Payer: Self-pay

## 2024-08-01 DIAGNOSIS — M818 Other osteoporosis without current pathological fracture: Secondary | ICD-10-CM

## 2024-08-01 DIAGNOSIS — E119 Type 2 diabetes mellitus without complications: Secondary | ICD-10-CM

## 2024-08-01 DIAGNOSIS — Z22322 Carrier or suspected carrier of Methicillin resistant Staphylococcus aureus: Secondary | ICD-10-CM

## 2024-08-01 DIAGNOSIS — L732 Hidradenitis suppurativa: Secondary | ICD-10-CM

## 2024-08-01 NOTE — Telephone Encounter (Signed)
 Last appt: 05/16/2024 Next appt:  08/09/2024

## 2024-08-02 ENCOUNTER — Other Ambulatory Visit: Payer: Self-pay

## 2024-08-02 DIAGNOSIS — Z22322 Carrier or suspected carrier of Methicillin resistant Staphylococcus aureus: Secondary | ICD-10-CM

## 2024-08-02 DIAGNOSIS — L732 Hidradenitis suppurativa: Secondary | ICD-10-CM

## 2024-08-02 MED ORDER — DOXYCYCLINE HYCLATE 50 MG PO CAPS *I*
50.0000 mg | ORAL_CAPSULE | ORAL | 2 refills | Status: DC
Start: 2024-08-02 — End: 2024-10-24
  Filled 2024-08-02: qty 28, 28d supply, fill #0
  Filled 2024-08-30: qty 28, 28d supply, fill #1
  Filled 2024-09-26: qty 28, 28d supply, fill #2

## 2024-08-03 ENCOUNTER — Other Ambulatory Visit: Payer: Self-pay

## 2024-08-03 ENCOUNTER — Ambulatory Visit: Payer: Self-pay | Admitting: Pharmacist

## 2024-08-03 ENCOUNTER — Telehealth: Payer: Self-pay

## 2024-08-03 NOTE — Nurse Navigator (Signed)
 Attempted to call patient to remind them of their injection appointment with Dr. Camie Duck tomorrow, 08/04/2024 at 1130. Left message on VM with appointment details, office location & address, and call back number, encouraging patient to call back with questions or transportation needs.

## 2024-08-03 NOTE — Telephone Encounter (Signed)
 Copied from CRM #[id number]. Topic: Access to Care - Medical Records Request>> Aug 03, 2024 10:49 AM Particia POUR wrote:Dianna, [facility], is calling to confirm a fax received by the office 07/24/2024, requesting medical records, regarding wound dressings office notes. Writer provided Medical Records  Phone: [phone] Fax: [phone]. Caller was transferred to the department; was advised they may need to leave a voice mail.Dianna [facility] can be reached at phone: [phone] fax: [phone] .

## 2024-08-04 ENCOUNTER — Ambulatory Visit: Attending: Physical Medicine and Rehabilitation | Admitting: Physical Medicine and Rehabilitation

## 2024-08-04 ENCOUNTER — Other Ambulatory Visit: Payer: Self-pay

## 2024-08-04 ENCOUNTER — Ambulatory Visit: Admitting: Physical Medicine and Rehabilitation

## 2024-08-04 VITALS — BP 97/50 | HR 103 | Temp 98.2°F | Ht 65.0 in | Wt 150.0 lb

## 2024-08-04 DIAGNOSIS — M62838 Other muscle spasm: Secondary | ICD-10-CM | POA: Insufficient documentation

## 2024-08-04 MED ORDER — INCOBOTULINUMTOXINA 100 UNITS IM SOLR *I*
100.0000 [IU] | Freq: Once | INTRAMUSCULAR | Status: AC | PRN
Start: 2024-08-04 — End: 2024-08-04
  Administered 2024-08-04: 100 [IU] via INTRAMUSCULAR

## 2024-08-04 NOTE — Addendum Note (Signed)
 Addended by: LEI CREDIT on: 08/04/2024 09:24 AM Modules accepted: Orders

## 2024-08-04 NOTE — Procedures (Signed)
 PMR Botulinumtoxin InjectionPerformed by: Maggy Wyble, MDAuthorized by: Lei Credit, MD  Date/time: 08/04/2024  9:00 AM EDTInjections:  35357 - Chemodenervation 1 ext 1-4 muscles and 35356 - Chemodenervation 1 ext 1-4 muscles, 1st addtlMedication:  100 units incobotulinumtoxin type A 100 units; 100 units incobotulinumtoxin type A 100 units; 100 units incobotulinumtoxin type A 100 units; 100 units incobotulinumtoxin type A 100 units; 100 units incobotulinumtoxin type A 100 units; 100 units incobotulinumtoxin type A 100 units See below Procedure Details The risks, benefits, indications, potential complications, and alternatives were explained to the patient and/or guardian who verbalized understanding and informed consent obtained.  The area for injection was identified and a time out called to re-identify.   The correct patient was identified with 2 identifiers The correct procedure, location site(s) and laterality were identified with the patient and/or guardian and correspond to the consent form  The appropriate site was marked and verified.  The patient was placed in the correct position. After prepping the skin with alcohol  overlying the following muscles, botlinum toxin was injected intramuscularly as follows.   Right and left quadriceps (vastus lateralis 75, vastus medialis 75, vastus intermedius 75, rectus femoris 75) 300 + 300 units   Botulinum toxin Lot #: [id number] toxin expiration date: 9/2027Total botox  units injected: 600 units Total botox  units wasted: 0 units   The patient tolerated the procedure without complications.  Patient will follow up in 12 weeks for repeat botox  injection.Credit Lei, MD

## 2024-08-07 ENCOUNTER — Other Ambulatory Visit: Payer: Self-pay

## 2024-08-08 ENCOUNTER — Other Ambulatory Visit: Payer: Self-pay

## 2024-08-09 ENCOUNTER — Ambulatory Visit: Attending: Family Medicine | Admitting: Family Medicine

## 2024-08-09 ENCOUNTER — Encounter: Payer: Self-pay | Admitting: Family Medicine

## 2024-08-09 ENCOUNTER — Encounter: Payer: Self-pay | Admitting: Gastroenterology

## 2024-08-09 VITALS — BP 104/66 | HR 97 | Ht 65.0 in

## 2024-08-09 DIAGNOSIS — M818 Other osteoporosis without current pathological fracture: Secondary | ICD-10-CM | POA: Insufficient documentation

## 2024-08-09 DIAGNOSIS — Z23 Encounter for immunization: Secondary | ICD-10-CM | POA: Insufficient documentation

## 2024-08-09 DIAGNOSIS — K592 Neurogenic bowel, not elsewhere classified: Secondary | ICD-10-CM | POA: Insufficient documentation

## 2024-08-09 DIAGNOSIS — F341 Dysthymic disorder: Secondary | ICD-10-CM | POA: Insufficient documentation

## 2024-08-09 DIAGNOSIS — G8254 Quadriplegia, C5-C7 incomplete: Secondary | ICD-10-CM | POA: Insufficient documentation

## 2024-08-09 DIAGNOSIS — Z0000 Encounter for general adult medical examination without abnormal findings: Secondary | ICD-10-CM | POA: Insufficient documentation

## 2024-08-09 DIAGNOSIS — H4900 Third [oculomotor] nerve palsy, unspecified eye: Secondary | ICD-10-CM | POA: Insufficient documentation

## 2024-08-09 DIAGNOSIS — E119 Type 2 diabetes mellitus without complications: Secondary | ICD-10-CM | POA: Insufficient documentation

## 2024-08-09 LAB — POCT HEMOGLOBIN A1C: Hemoglobin A1C,POC: 6.5 % — ABNORMAL HIGH (ref <=5.6)

## 2024-08-09 NOTE — Progress Notes (Signed)
 [facility] Outpatient Progress Note:Subjective:Cindy Ochoa is a 38 y.o. female presenting for Follow-up of chronic medical conditions in addition to routine health maintenanceHistory of Present IllnessThe patient is a 38 year old female who presents for a routine physical and to fill out personal care forms.She reports no acute health concerns at this time. However, she is experiencing bloating and constipation, with no bowel movements for the past 3 weeks. Her abdomen is swollen, resembling a 42-month pregnancy. She has not consulted a gastroenterologist but did see a specialist once for hemorrhoids. A referral was placed to [facility] last year, but she was unable to see them as the doctor was retired. She has tried Dulcolax, dietary fiber, and MiraLAX  without success.She is currently under stress due to her mother's memory loss and her son's impending move out of their home. She reports feeling unsupported and anxious. Her sleep is satisfactory.She received a Botox  injection last week, which has helped with her spasms.ROS: As stated aboveObjective:BP 104/66 (BP Location: Right arm, Patient Position: Sitting, Cuff Size: adult)   Pulse 97   Ht 1.651 m (5' 5)   SpO2 98%   BMI 24.96 kg/m Vitals, Medical/Surgical Histories, and Recent Care Notes reviewedPhysical ExamEyes: Normal pupillary reflex in left eye. Fixed pupil correction in right eye, where palsy is present.Mouth/Throat: Oral mucosa appears healthy.Neck: No adenopathy detected.Respiratory: Chest sounds clear to auscultation. No coughing observed. Transmitted tracheal sounds and good breaths noted.Cardiovascular: Regular rate and rhythm, no murmurs, rubs, or gallops.Gastrointestinal: Some abdominal distention. Ostomy , clean,  pink, without induration.  Normoactive bowel sounds.Extremities: Bilateral upper extremity pulses are equal and symmetric.  Slightly diminished posterior tibial pulse in right lower extremity, possibly due to positioning, but pulses are palpable x4 extremities.Skin: Hirsutism noted under chin, a chronic condition for her. Stoma scar appears healthy with no drainage.Assessment/Plan:Problem List Items Addressed This Visit    High  T2DM, no complications  HbA1c,POC was last checked on 08/09/2024 and was 6.5% which is at goal of <7. Per our records the patient is UTD on diabetic eye exam. Patient is UTD on diabetic foot exam. Patient is UTD on microalbuminuria screening.   Diabetic medication regimen as of TODAY is as follows: Current Diabetes Medications        metFORMIN  (GLUCOPHAGE -XR) 500 mg 24 hr tablet Take 1 tablet (500 mg total) by mouth every evening with dinner. Swallow whole. Do not crush, break, or chew.      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 Continues to experience difficulty with bowel movements even with the use of Dulcolax. Previous attempts with dietary fiber and MiraLAX  have been ineffective.- Referral to gastroenterology will be made to address these issues. Discussion about the practicality of colonoscopy prep due to neurogenic bowel.- Referral to GI for motility issues is urgent.   Relevant Orders  AMB REFERRAL TO GASTROENTEROLOGY  AMB REFERRAL TO SOCIAL WORK (for Internal Primary Care use only)   Medium  Osteoporosis of disuse (Chronic)  Relevant Orders  DEXA Scan - Axial, No Trabecular Bone Score  Dysthymia   - Reports increased anxiety due to her mother's memory loss and lack of support. Sleeping okay but concerned about her  mother.- Mental health screening from  a few months ago was stable. Advised to contact the office if she wishes to trial any medication for anxiety before bed.- Counseling provided regarding available options for anxiety treatment.- No medication prescribed at this time, but options are available if needed.Referral to social work  placed today to help with personal care needs and reduce overall stressors due to health care navigation    Low  Oculomotor palsy, partial (Chronic)  Relevant Orders  AMB REFERRAL TO SOCIAL WORK (for Internal Primary Care use only) Other Visit Diagnoses     Annual physical exam    -  Primary    Need for prophylactic vaccination against human papillomavirus      Relevant Orders  HPV (Gardasil  9) 9-valent (Completed)  Follow up: Follow up in about 6 months (around 02/09/2025) for Reassessment.Ronal Ee Chenoa Luddy, [facility] Clinical Faculty, Department of [facility] [address] [place], [place] [address]Eynwz: [phone]: [phone] my patients:  Some of my notes are dictated using voice-recognition program which may result in minor transcription errors.  If you have any urgent concerns, please contact me through MyChart.  Please bring any non-urgent concerns to your next appointment so we can discuss them.  Thank you!

## 2024-08-09 NOTE — Assessment & Plan Note (Addendum)
-   Reports increased anxiety due to her mother's memory loss and lack of support. Sleeping okay but concerned about her mother.- Mental health screening from a few months ago was stable. Advised to contact the office if she wishes to trial any medication for anxiety before bed.- Counseling provided regarding available options for anxiety treatment.- No medication prescribed at this time, but options are available if needed.Referral to social work  placed today to help with personal care needs and reduce overall stressors due to health care navigation

## 2024-08-09 NOTE — Assessment & Plan Note (Signed)
 HbA1c,POC was last checked on 08/09/2024 and was 6.5% which is at goal of <7. Per our records the patient is UTD on diabetic eye exam. Patient is UTD on diabetic foot exam. Patient is UTD on microalbuminuria screening.   Diabetic medication regimen as of TODAY is as follows: Current Diabetes Medications        metFORMIN  (GLUCOPHAGE -XR) 500 mg 24 hr tablet Take 1 tablet (500 mg total) by mouth every evening with dinner. Swallow whole. Do not crush, break, or chew.
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
## 2024-08-09 NOTE — Patient Instructions (Addendum)
 Dear Cindy Ochoa Cindy Ochoa was wonderful to spend some time with you today discussing preventative health and wellness!  Below are some general recommendations for our adult patients age 39-65:In addition to topics covered during our annual physical, the following acute medical concerns & chronic medical conditions were addressed:Problem List Items Addressed This Visit    High  T2DM, no complications  HbA1c,POC was last checked on 08/09/2024 and was 6.5% which is at goal of <7. Per our records the patient is UTD on diabetic eye exam. Patient is UTD on diabetic foot exam. Patient is UTD on microalbuminuria screening.   Diabetic medication regimen as of TODAY is as follows: Current Diabetes Medications        metFORMIN  (GLUCOPHAGE -XR) 500 mg 24 hr tablet Take 1 tablet (500 mg total) by mouth every evening with dinner. Swallow whole. Do not crush, break, or chew.      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 Continues to experience difficulty with bowel movements even with the use of Dulcolax. Previous attempts with dietary fiber and MiraLAX  have been ineffective.- Referral to gastroenterology will be made to address these issues. Discussion about the practicality of colonoscopy prep due to neurogenic bowel.- Referral to GI for motility issues is urgent.   Relevant Orders  AMB REFERRAL TO GASTROENTEROLOGY  AMB REFERRAL TO SOCIAL WORK (for Internal Primary Care use only)   Medium  Osteoporosis of disuse (Chronic)  Relevant Orders  DEXA Scan - Axial, No Trabecular Bone Score  Dysthymia   - Reports increased anxiety due to her mother's  memory loss and lack of support. Sleeping okay but concerned about her mother.- Mental health screening from a few months ago was stable. Advised to contact the office if she wishes to trial any medication for anxiety before bed.- Counseling provided regarding available options for anxiety treatment.- No medication prescribed at this time, but options are available if needed.Referral to social work  placed today to help with personal care needs and reduce overall stressors due to health care navigation    Low  Oculomotor palsy, partial (Chronic)  Relevant Orders  AMB REFERRAL TO SOCIAL WORK (for Internal Primary Care use only) Other Visit Diagnoses     Annual physical exam    -  Primary    Need for prophylactic vaccination against human papillomavirus      Relevant Orders  HPV (Gardasil  9) 9-valent (Completed)  The following is a brief overview of the general recommendations to try and maintain your health as you get olderSafety:Make sure that your home has working smoke detectors and carbon monoxide detectorsAlways wear your seatbelt while in a motor vehicleAlways wear a helmet when riding a bicycle, motorcycle, ATV or scooterDo not text while drivingRoutine healthcare:Try and see a dentist for routine exams every 6 monthsSee an eye doctor every few years (unless you have a medical condition that dictates more frequent visits)Substance use:If you are a smoker, quitting smoking is one of the best things you can do for your healthIs just one cigarette okay? NOMen who smoke about one cigarette per Graelyn Bihl have a 48% higher risk of heart disease than never smokers and a 25% higher risk of stroke (or 74% and 30%, respectively, when allowing  for confounding factors)The estimates are higher in women: 57% for heart disease and 31% for stroke (or 119% and 46% when allowing for multiple confounders), again compared with never smokers.People who  smoke about one cigarette each Ovetta Bazzano have about 40-50% of the excess risk associated with smoking 20 per Calvina Liptak (coronary heart disease and stroke)Source: BMJ 2018;360:j5855Limit alcohol  intake to less than 14 standard drinks per weekSkin cancer awareness and prevention:Always wear sunscreen with an SPF of 30 or higher if you are going to be in the sun for an extended period of timeOnce a month, examine your own skin - look for moles that are new, changing, and/or the "ugly duckling" (one spot that looks different from all the others)Suspicious moles may demonstrate one or more of the following:             A: asymmetryB: border smudging or irregularityC: color (>1 color)D: diameter greater than a pencil eraser size E: evolving, or changing molePhysical Activity recommendations:You should move more and sit less throughout the Marlynn Hinckley. Some physical activity is better than none. Those who sit less and do any amount of moderate-to-vigorous physical activity gain some health benefts.For substantial health benefts, you should do at least 150 minutes (2 hours and 30 minutes) to 300 minutes (5 hours) a week of moderate-intensity (e.g. brisk walking), or 75 minutes (1 hour and 15 minutes) to 150 minutes (2 hours and 30 minutes) a week of vigorous-intensity (e.g. jogging) aerobic physical activity, or an equivalent combination of moderate- and vigorous-intensity aerobic activity. Preferably, aerobic activity should be spread throughout the week.Additional health benefts are gained by engaging in physical activity beyond the equivalent of 300 minutes (5 hours) of moderate-intensity physical activity a week.You should also do muscle-strengthening activities of moderate or greater intensity and that involve all major muscle groups on 2 or more days a week, as these activities provide additional health benefts. Dietary recommendations:The diet with the highest level of scientific evidence of  health benefits is the Mediterranean dietFor more information [email or web address].bennett.biz/

## 2024-08-09 NOTE — Assessment & Plan Note (Signed)
-   History of neurogenic bowel and persistent issues with bloating and constipation despite optimizing bowel regimen. Continues to experience difficulty with bowel movements even with the use of Dulcolax. Previous attempts with dietary fiber and MiraLAX  have been ineffective.- Referral to gastroenterology will be made to address these issues. Discussion about the practicality of colonoscopy prep due to neurogenic bowel.- Referral to GI for motility issues is urgent.

## 2024-08-10 ENCOUNTER — Telehealth: Payer: Self-pay | Admitting: Physician Assistant

## 2024-08-10 NOTE — Telephone Encounter (Unsigned)
 Copied from CRM #[id number]. Topic: Return Call - Schedule Appointment>> Aug 10, 2024  8:08 AM Nat HERO wrote:Patient is returning a call from [place] to schedule a NPV with any [facility] GI APP.Patient is scheduled for NPV on  12/29/24. With NP Jacquline Both.SABRA

## 2024-08-14 ENCOUNTER — Other Ambulatory Visit: Payer: Self-pay

## 2024-08-14 DIAGNOSIS — G8254 Quadriplegia, C5-C7 incomplete: Secondary | ICD-10-CM

## 2024-08-14 NOTE — Progress Notes (Signed)
 Primary Care - Care ManagementNew Referral Date Received  08/11/24 Received By  Page Lancon, RN Referred By  Dr. Michae Initial Request Details  in need of more support at home due to family members having their own health concerns leading to inability to care for Cindy Ochoa - also having issues with insurance coverage. Chart Review / Case Review:  Per pcp, in need of more support at home due to family members having their own health concerns leading to inability to care for Cindy Ochoa - also having issues with insurance coverage. Assessment / Actions Taken:Outreach completed to patient. Patient reports that she is currently seeking additional aide hours to provide 24 hour care at home. She is currently qualified for 126 hours. She states that she had to hire a new aide due to her mom not being able to assist with care at this time due to her own medical concerns. She is currently looking for 2 additional aides to hire and has 2 interviews this week. Per chart review, patient had previous hhcm Ronal Caldron with [employer]. Patient reports that she has not worked with [employer] in a few years. She would be interested in new referral to care management. Writer to submit referral request to patient. Writer assessed additional social determinant needs. Patient states food insecurity and difficulty managing meals at home. Patient states that her son helps with meal prep currently but plans to move soon. She would be interested in home meal delivery service. Writer to send patient resources through [place].  Lastly, patient states she is looking for a new eye doctor and dentist that accepts [employer] insurance. She states she tried to get an appointment at [facility] and was told that they did not accept current insurance plan. Writer to review with pcp. Allergies Allergen Reactions  Heparin Other (See Comments)   Thrombocytopenia; HIT  Ibuprofen  Swelling   Pt reports lip swelling  after taking ibuprofen .   Nitrofurantoin  Nausea And Vomiting Current Outpatient Medications Medication Sig  doxycycline  hyclate (VIBRAMYCIN ) 50 mg capsule Take 1 capsule (50 mg total) by mouth every morning for Hidradenitis Suppurativa, Infection of the Skin and/or Soft Tissue.  ergocalciferol  50,000 unit capsule Take 1 capsule (50,000 units total) by mouth every 7 (seven) days.  bisacodyl  (DULCOLAX) 10 mg suppository Place 1 suppository (10 mg total) rectally every other day.  acetaminophen  (TYLENOL ) 500 mg tablet Take 2 tablets (1,000 mg total) by mouth every 8 hours as needed for Pain or Fever. for pain  carboxymethylcellulose (REFRESH PLUS) 0.5 % ophthalmic solution Place 1 drop into both eyes 3 times daily as needed.  metFORMIN  (GLUCOPHAGE -XR) 500 mg 24 hr tablet Take 1 tablet (500 mg total) by mouth every evening with dinner. Swallow whole. Do not crush, break, or chew.  generic DME Dispense: briefs ICD-10: N39.41Duration: LifetimeHt Readings from Last 1 Encounters:04/16/21 : 1.626 m (5' 4) Wt Readings from Last 1 Encounters:04/16/21 : 61.2 kg (135 lb)  generic DME Dispense: chux pads ICD-10: N39.41Duration: LifetimeHt Readings from Last 1 Encounters:04/16/21 : 1.626 m (5' 4) Wt Readings from Last 1 Encounters:04/16/21 : 61.2 kg (135 lb)  adhesive foam bandage (MEPILEX) MISC By no specified route as needed (dressing).  Medihoney Wound/Burn Dressing GEL external gel Apply 1 mL topically as needed.  Silver (AQUACEL AG FOAM) 3.2X3.2 PADS Apply 1 Pad topically as needed.  tracheostomy care kit Use 3 times per day as instruction, used with suction machine intermittently for excess sputum  Ostomy Supplies [facility] Urostomy two piece bag  and wafer 1 1/4 H  7/8 V N31.9 Neuromuscular dysfunction of the bladder. Use as directed.  Ostomy Supplies [employer] Urostomy two piece bag and wafer 1 1/4 H  7/8 V N31.9 Neuromuscular  dysfunction of the bladder Use as directed  Ostomy Supplies [employer] Urostomy two piece bag and wafer 1 1/4 H  7/8 V N31.9 Neuromuscular dysfunction of the bladder  Ostomy Supplies MISC Barrier ring. Use as directed.  disposable underpads 30x36 (CHUX) Use 6 times daily and PRN. Dx N39.42  Incontinence without sensory awareness  incontinence supply disposable Large pull ups - use up to 5 x daily  Dx N39.46  Non-System Medication Chux padsHt: 5' 5 Wt: 143lbsICD code: N39.41  Non-System Medication Briefs/pullups, largeHt: 5' 5 Wt: 143lbsICD code: N39.41  Non-System Medication Motorized wheelchair- Lifetime 99Quad c-6  Non-System Medication Nebulizer with tubing and maskRO 689  Respiratory insufficiency  Non-System Medication Portable suctionrespiratory insufficiencyTracheotomy Quadriparesis  generic DME Suction machine with all related supplies.  Non-System Medication Large underpadsHt: 5' 5 Wt: 143lbsICD code: N39.41  generic DME Dispense: ROHOIndication: Quadriplegia ICD-10: R53.2Duration: LifetimeHt Readings from Last 1 Encounters:03/19/20 : 1.626 m (5' 4) Wt Readings from Last 1 Encounters:03/19/20 : 63.5 kg (140 lb)  Non-System Medication GlovesHt: 5' 5 Wt: 143lbsICD code: N39.41  disposable gloves 1 box Dynarex PF Vinyl Gloves  patient lift Use as directed for patient lifting. For lifetime use; ICD 10: G82.54 Ht: 1.4m Wt: 76.2 kg  adjustable bath/shower seat with back For lifetime use; ICD 10: G82.54 Ht: 1.39m Wt: 76.2 kg  generic DME Repairs to hospital bed/hoyer liftICD 10: G82.54 Ht: 1.64m Wt: 76.2 kg  Non-System Medication Gel overlay mattress for hospital bed - diagnosis G82.54, L89.93, L89.159  Non-System Medication Easy Tip Leg Bags 1000mg  - diagnosis G82.53 N39.46  etonogestrel  (IMPLANON , NEXPLANON ) 68 MG IMPL Inject 68 mg into the skin once. Placed 07/09/21 Social Determinants of HealthFood  Insecurity: Food Insecurity Present (06/09/2024)  Hunger Vital Sign   Worried About Running Out of Food in the Last Year: Never true   Ran Out of Food in the Last Year: Often true  Date of last entry: 06/09/2024  Housing Stability: Low Risk  (06/09/2024)  Housing Stability Vital Sign   Unable to Pay for Housing in the Last Year: No   Number of Times Moved in the Last Year: 0   Homeless in the Last Year: No  Date of last entry: 06/09/2024 Utilities: Not At Risk (06/09/2024)  A[facility] Utilities   Threatened with loss of utilities: No  Date of last entry: 06/09/2024 Transportation Needs: No Transportation Needs (06/09/2024)  PRAPARE - Transportation   Lack of Transportation (Medical): No   Lack of Transportation (Non-Medical): No  Date of last entry: 06/09/2024   08/14/2024   2:44 PM Interventions Accepted Patient accepts interventions for Food Next Steps / Plan: Writer to follow up with patient on resources, review pcp recommendations. Referral Status Open

## 2024-08-14 NOTE — Progress Notes (Signed)
 Submitted Health Home referral.

## 2024-08-14 NOTE — Progress Notes (Signed)
 Health Home Referral NoteUrgent Referral? No Patient Legal Name: Cindy Ochoa [facility] Preferred Name: Cindy Ochoa [facility] Subscriber #: [id number] Managed Care Organization Name: Medicaid NYSDOB: 19-Jan-1987Patient Sex Assigned at Birth: Female [1]Patient Gender Identity: Female [1][place]'s Address[place] [address] [address]idence: [place] [[id number]]Phone: [phone]-Mail: [email or web address] Contact(s) Name, Phone#: N/ALanguage Spoken: EnglishInterpreter Services needed? No Specify Preferred or Recommended Care Management Agency, if any: n/aEligibility Categories: Mental Health Condition, Diabetes, or Other Chronic Conditions Health Home Care Management Needs: Lack of or inadequate social/family/housing support, Deficits in activities of daily living such as dressing, eating, etc. , or Financial needs Health Home Risk and Safety Concerns: Unsafe Living Environment Provide additional information regarding Risk and Safety Concerns checked above. N/aNarrative: patient requesting resources to support needs at home including: financial concerns, food insecurity, aide services and support.  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 Your health records provide information about your illnesses, injuries, medicines and/or test results. Your records may include sensitive information, such as information about HIV status, mental health records, reproductive health records, drug and alcohol  treatment, and genetic information. If you permit disclosure, your health information will only be used to provide you with care management and related health and social services. This includes referral from one provider to another, consultation regarding care, provision of care management services, and coordination of care among providers. Your health information may be re-disclosed only as permitted by state and federal laws and regulations. These laws limit re-disclosure of information about your treatment at a substance abuse or mental health program, HIV related information, genetic records, and records of sexually transmitted illnesses. Your choice to give or deny consent to disclose your health information will not be the basis for denial of health services or health insurance. You can withdraw your consent at any time by signing a Withdrawal of Consent Form and giving it to one of the providers listed in Attachment A. But anyone who receives information while your consent is in effect may retain it. Even if you withdraw your consent, they are not required to return your information or remove it from their records. You are entitled to get a copy of this Consent Form after you sign it. Consent to disclosure of  health information The person whose information may be used or disclosed is: Name: Cindy Ochoa of Birth: 1986/02/24   . 1. The information that may be disclosed includes all records of diagnosis and health care treatment and all education records including, but not limited to: Mental health records, except that disclosure of psychotherapy notes is not permitted;  Substance abuse treatment records; HIV related information; Genetic information; Information about sexually transmitted diseases; and Education records. 2. This information may be disclosed to the persons or organizations listed in Attachment A. 3. This information may be disclosed by any person or organization that holds a record described below, including those listed in Attachment A. 4. Use and disclosure of this information is permitted only as necessary for the purposes of the provision of delivery of health and social services, including outreach, service planning, referrals, care coordination, direct care, and monitoring of the quality of service. 5. This permission expires on  ___8/18/26__(date). 6. I understand that this permission may be revoked. I also understand that records disclosed before this permission is revoked may not be retrieved. Any person or organization that relied on this permission may continue to use or disclose health information as needed to complete treatment. I am the person whose records will be used or disclosed, or that individual's personal representative. (If personal representative, please enter relationship Cindy Khat.) I give permission to use and disclose my records as described in this document. Verbal consent obtained via: Contact: Phone, from: Pt/Rep: Patient. By: Maimouna Rondeau  Date: 08/14/24  . CONSENT TO DISCLOSE HEALTH RECORDS - ATTACHMENT AHealth information may be disclosed for purposes of treatment to the organizations listed below. The following  organizations provide and/or administer Care Management in [facility] County:[place] [facility] L. [place], [employer]. (dba HCR) The [employer] Omaha Rehab [employer] Anthony L. [facility] Lifespan of [place] of [place] Lifetime Care [employer] Street Settlement [place] [facility]. dba Gaffer Services Hartford Financial [facility] KeyCorp Health Options [facility] Collaborative Temple-Inland for Healthone [facility] Office of Mental Health Catholic Charities Community Services [place] Plan for Medical Care, Inc. [facility] New Directions Youth & [facility] Health Department Graf  Care Coordination Program, Inc. [facility] Plastica [facility] Inc Chapter [place] [facility] dba [facility] [place] Co Dept of Mental Health Community Care of [place], Inc. DBA Visiting Nurse Signature Care Pathways, Inc Community Place of [place] Person Centered Housing Options Companion Care of [place] [facility] Counseling & Assessment Services, [place]. Coordinated Care Services, Inc. [facility] Unity/GMHC/RMHC Delphi Drug and Alcohol  Council [facility] Community Services [facility] [employer] Corporation [facility] Society for Prevention Epilepsy-Pralid, Inc. Elspeth [facility] Family Services of [place], [employer]. Hosp Ps[facility]iatric[facility] Correccional Therapy Works United Cerebral Palsy of [place] [employer] Addictions Counseling and Referral (FLACRA) [place] of [place]/Strong [facility] United Cerebral Palsy dba Happiness House Venture [place], [employer]. [facility] [place] [facility] of [place] Co. Mental Health [place] ARC Glove House [place] [facility] Care Health Greater  Health Home Network [facility]) YWCA HCR Care Management, [facility]  Health  Homes of [place]  [facility])  [facility]  [facility]  [facility]uther Porter-Starke Services Inc, [employer].  Ibero-American [employer] League  [facility] Family Medicine

## 2024-08-15 NOTE — Progress Notes (Addendum)
 Outreach completed to patient and left voice message and sent mychart message to provide resources for new dentist and eye doctor- [facility].  Letter pended for insurance for provider to review and sign. Writer to follow up with patient on resources.

## 2024-08-17 ENCOUNTER — Other Ambulatory Visit: Payer: Self-pay

## 2024-08-18 ENCOUNTER — Ambulatory Visit: Admitting: Obstetrics and Gynecology

## 2024-08-22 ENCOUNTER — Other Ambulatory Visit: Payer: Self-pay

## 2024-08-29 ENCOUNTER — Other Ambulatory Visit: Payer: Self-pay

## 2024-08-30 ENCOUNTER — Ambulatory Visit: Payer: Self-pay

## 2024-08-30 ENCOUNTER — Other Ambulatory Visit: Payer: Self-pay

## 2024-08-30 DIAGNOSIS — Z22322 Carrier or suspected carrier of Methicillin resistant Staphylococcus aureus: Secondary | ICD-10-CM

## 2024-08-30 DIAGNOSIS — M818 Other osteoporosis without current pathological fracture: Secondary | ICD-10-CM

## 2024-08-30 DIAGNOSIS — E119 Type 2 diabetes mellitus without complications: Secondary | ICD-10-CM

## 2024-08-30 DIAGNOSIS — L732 Hidradenitis suppurativa: Secondary | ICD-10-CM

## 2024-08-31 ENCOUNTER — Other Ambulatory Visit: Payer: Self-pay

## 2024-09-01 ENCOUNTER — Other Ambulatory Visit: Payer: Self-pay

## 2024-09-01 NOTE — Progress Notes (Signed)
 Error

## 2024-09-04 ENCOUNTER — Other Ambulatory Visit: Payer: Self-pay

## 2024-09-05 ENCOUNTER — Other Ambulatory Visit: Payer: Self-pay

## 2024-09-06 NOTE — Progress Notes (Signed)
 I received text message from Cindy Ochoa that she had decided to cancel for the care management service. I tried to respond to her to get a reason why, but she did not reply. I will plan to close this referral based on her text message.

## 2024-09-06 NOTE — Progress Notes (Signed)
 I tried to call Cindy Ochoa at [phone].. person who answered the phone stated that she was in the shower and he would ask her to call me back when she was done.

## 2024-09-06 NOTE — Progress Notes (Signed)
 I sent email message to referring provider, Lorette Sous , to let her know that referral has been assigned to me and to see if there is any additional information she may have that would be helpful in working with this client.

## 2024-09-06 NOTE — Progress Notes (Signed)
 I tried to call Myca at [phone].. there was no answer at this time, but I was able to leave message on voicemail requesting that she call me regarding referral.

## 2024-09-06 NOTE — Progress Notes (Signed)
 I received call from [place] responding to my voicemail message.. she said that she was interested in health home care management services and we set up a plan to meet on 09/05/24 at 10am.. at her home to complete enrollment paperwork.

## 2024-09-12 ENCOUNTER — Encounter: Payer: Self-pay | Admitting: Gastroenterology

## 2024-09-13 ENCOUNTER — Encounter: Admitting: Family Medicine

## 2024-09-22 ENCOUNTER — Ambulatory Visit: Admitting: Obstetrics and Gynecology

## 2024-09-25 ENCOUNTER — Telehealth: Payer: Self-pay

## 2024-09-25 NOTE — Telephone Encounter (Signed)
 Writer called and left voicemail offering to reschedule 09-22-24 no showed appt.  A Mychart message will be sent to the patient.SABRA

## 2024-09-26 ENCOUNTER — Ambulatory Visit: Payer: Self-pay

## 2024-09-26 ENCOUNTER — Other Ambulatory Visit: Payer: Self-pay

## 2024-09-26 DIAGNOSIS — E119 Type 2 diabetes mellitus without complications: Secondary | ICD-10-CM

## 2024-09-26 DIAGNOSIS — Z22322 Carrier or suspected carrier of Methicillin resistant Staphylococcus aureus: Secondary | ICD-10-CM

## 2024-09-26 DIAGNOSIS — M818 Other osteoporosis without current pathological fracture: Secondary | ICD-10-CM

## 2024-09-26 DIAGNOSIS — L732 Hidradenitis suppurativa: Secondary | ICD-10-CM

## 2024-09-27 ENCOUNTER — Encounter: Payer: Self-pay | Admitting: Obstetrics and Gynecology

## 2024-10-02 ENCOUNTER — Other Ambulatory Visit: Payer: Self-pay

## 2024-10-03 ENCOUNTER — Other Ambulatory Visit: Payer: Self-pay

## 2024-10-06 ENCOUNTER — Encounter: Payer: Self-pay | Admitting: Internal Medicine

## 2024-10-10 ENCOUNTER — Other Ambulatory Visit: Payer: Self-pay | Admitting: Family Medicine

## 2024-10-10 NOTE — Telephone Encounter (Signed)
 Last appt:8/13/2025Next appt:05/23/2025

## 2024-10-16 ENCOUNTER — Encounter: Payer: Self-pay | Admitting: Family Medicine

## 2024-10-16 NOTE — Telephone Encounter (Signed)
Letter pended if ok to send.

## 2024-10-19 ENCOUNTER — Other Ambulatory Visit: Payer: Self-pay

## 2024-10-20 ENCOUNTER — Ambulatory Visit: Admitting: Optometrist

## 2024-10-24 ENCOUNTER — Other Ambulatory Visit: Payer: Self-pay

## 2024-10-24 ENCOUNTER — Other Ambulatory Visit: Payer: Self-pay | Admitting: Family Medicine

## 2024-10-24 ENCOUNTER — Ambulatory Visit: Payer: Self-pay

## 2024-10-24 DIAGNOSIS — E119 Type 2 diabetes mellitus without complications: Secondary | ICD-10-CM

## 2024-10-24 DIAGNOSIS — Z22322 Carrier or suspected carrier of Methicillin resistant Staphylococcus aureus: Secondary | ICD-10-CM

## 2024-10-24 DIAGNOSIS — L732 Hidradenitis suppurativa: Secondary | ICD-10-CM

## 2024-10-24 DIAGNOSIS — M818 Other osteoporosis without current pathological fracture: Secondary | ICD-10-CM

## 2024-10-24 NOTE — Telephone Encounter (Signed)
 Last appt: 08/09/2024 Next appt:  05/23/2025

## 2024-10-25 ENCOUNTER — Other Ambulatory Visit: Payer: Self-pay

## 2024-10-25 DIAGNOSIS — L732 Hidradenitis suppurativa: Secondary | ICD-10-CM

## 2024-10-25 DIAGNOSIS — M818 Other osteoporosis without current pathological fracture: Secondary | ICD-10-CM

## 2024-10-25 DIAGNOSIS — Z22322 Carrier or suspected carrier of Methicillin resistant Staphylococcus aureus: Secondary | ICD-10-CM

## 2024-10-25 MED ORDER — ERGOCALCIFEROL 50000 UNIT PO CAPS *I*
50000.0000 [IU] | ORAL_CAPSULE | ORAL | 5 refills | Status: AC
  Filled 2024-10-25: qty 4, 28d supply, fill #0
  Filled 2024-11-20: qty 4, 28d supply, fill #1
  Filled 2024-12-19: qty 4, 28d supply, fill #2
  Filled 2025-01-16: qty 4, 28d supply, fill #3

## 2024-10-25 MED ORDER — DOXYCYCLINE HYCLATE 50 MG PO CAPS *I*
50.0000 mg | ORAL_CAPSULE | ORAL | 2 refills | Status: DC
Start: 2024-10-25 — End: 2024-11-06
  Filled 2024-10-25: qty 28, 28d supply, fill #0

## 2024-10-26 ENCOUNTER — Ambulatory Visit: Payer: Self-pay

## 2024-10-26 ENCOUNTER — Other Ambulatory Visit: Payer: Self-pay

## 2024-10-27 ENCOUNTER — Other Ambulatory Visit: Payer: Self-pay

## 2024-10-31 ENCOUNTER — Other Ambulatory Visit: Payer: Self-pay

## 2024-11-02 ENCOUNTER — Other Ambulatory Visit: Payer: Self-pay

## 2024-11-02 ENCOUNTER — Telehealth: Payer: Self-pay

## 2024-11-02 NOTE — Telephone Encounter (Signed)
 Attempted to call patient to remind them of their injection appointment with Dr. Camie Duck Tomorrow, 11/03/24 at 0930 (arrive by 0930). Left message on VM with appointment details, office location & address, and call back number, encouraging patient to call back with questions or transportation needs.

## 2024-11-03 ENCOUNTER — Ambulatory Visit
Payer: Medicare (Managed Care) | Attending: Physical Medicine and Rehabilitation | Admitting: Physical Medicine and Rehabilitation

## 2024-11-03 VITALS — BP 100/57 | HR 104 | Temp 96.8°F | Resp 16 | Ht 65.0 in | Wt 150.0 lb

## 2024-11-03 DIAGNOSIS — M62838 Other muscle spasm: Secondary | ICD-10-CM | POA: Insufficient documentation

## 2024-11-03 MED ORDER — INCOBOTULINUMTOXINA 100 UNITS IM SOLR *I*
100.0000 [IU] | Freq: Once | INTRAMUSCULAR | Status: AC | PRN
Start: 2024-11-03 — End: 2024-11-03
  Administered 2024-11-03: 100 [IU] via INTRAMUSCULAR

## 2024-11-03 NOTE — Procedures (Signed)
 PMR Botulinumtoxin InjectionPerformed by: Brena Windsor, MDAuthorized by: Lei Credit, MD  Date/time: 11/03/2024  9:30 AM ESTInjections:  35357 - Chemodenervation 1 ext 1-4 muscles and 35356 - Chemodenervation 1 ext 1-4 muscles, 1st addtlMedication:  100 units incobotulinumtoxin type A 100 units; 100 units incobotulinumtoxin type A 100 units; 100 units incobotulinumtoxin type A 100 units; 100 units incobotulinumtoxin type A 100 units; 100 units incobotulinumtoxin type A 100 units; 100 units incobotulinumtoxin type A 100 units See below Procedure Details The risks, benefits, indications, potential complications, and alternatives were explained to the patient and/or guardian who verbalized understanding and informed consent obtained.  The area for injection was identified and a time out called to re-identify.   The correct patient was identified with 2 identifiers The correct procedure, location site(s) and laterality were identified with the patient and/or guardian and correspond to the consent form  The appropriate site was marked and verified.  The patient was placed in the correct position. After prepping the skin with alcohol  overlying the following muscles, botlinum toxin was injected intramuscularly as follows.   Right and left quadriceps (vastus lateralis 75, vastus medialis 75, vastus intermedius 75, rectus femoris 75) 300 + 300 units   Botulinum toxin Lot #: [id number] xeominBotulinum toxin expiration date: 12/2027Total botox  units injected: 600 units Total botox  units wasted: 0 units   The patient tolerated the procedure without complications.  Patient will follow up in 12 weeks for repeat botox  injection.Credit Lei, MD

## 2024-11-05 ENCOUNTER — Other Ambulatory Visit: Payer: Self-pay

## 2024-11-06 ENCOUNTER — Encounter: Payer: Self-pay | Admitting: Family Medicine

## 2024-11-06 ENCOUNTER — Ambulatory Visit: Payer: Medicare (Managed Care) | Attending: Family Medicine | Admitting: Family Medicine

## 2024-11-06 VITALS — BP 118/80 | HR 92 | Ht 65.0 in

## 2024-11-06 DIAGNOSIS — Z22322 Carrier or suspected carrier of Methicillin resistant Staphylococcus aureus: Secondary | ICD-10-CM

## 2024-11-06 DIAGNOSIS — L732 Hidradenitis suppurativa: Secondary | ICD-10-CM

## 2024-11-06 MED ORDER — MUPIROCIN 2 % EX OINT *I*
TOPICAL_OINTMENT | Freq: Three times a day (TID) | CUTANEOUS | 1 refills | Status: AC
Start: 2024-11-06 — End: ?

## 2024-11-06 MED ORDER — CHLORHEXIDINE GLUCONATE SOLN
2.0000 | Freq: Every day | 0 refills | Status: AC
Start: 2024-11-06 — End: ?

## 2024-11-06 MED ORDER — SULFAMETHOXAZOLE-TRIMETHOPRIM 800-160 MG PO TABS *I*
1.0000 | ORAL_TABLET | Freq: Two times a day (BID) | ORAL | 0 refills | Status: AC
Start: 2024-11-06 — End: 2024-11-13

## 2024-11-06 NOTE — Progress Notes (Signed)
 [facility] Outpatient Progress Note:Subjective:Cindy Ochoa is a 38 y.o. female presenting for Breast MassHPI:History of Present IllnessThe patient is a 38 year old female who presents for a lump in her right breast. She is accompanied by her mother.Last Thursday, the patient's mother noticed a large knot on the patient's right breast. The patient suspects it might be an abscess due to the associated odor and drainage. This morning, her mother removed a patch from the area, revealing drainage, and subsequently applied a new patch. The patient reports a sensation of warmth within the breast but does not experience any pain to that area at baseline and has not noticed any redness. The size of the mass has decreased compared to the previous week. Her mother corroborates the absence of redness and notes the presence of a white, mucus-like substance over the mass. The mass appears to be healing, with less drainage than before.The patient has a history of abscesses on her neck and underarms, with the most recent one occurring a month ago under her right armpit, and another one on the left side of her neck approximately 3 months prior. She was previously prescribed doxycycline  for 3 months by Dr. Michae, but her insurance did not cover chlorhexidine wipes. She has discontinued the use of doxycycline . Objective:BP 118/80 (BP Location: Left arm, Patient Position: Sitting, Cuff Size: adult)   Pulse 92   Ht 1.651 m (5' 5)   SpO2 98%   BMI 24.96 kg/m Vitals reviewedGeneral: Well-appearing, well-nourished, NAD Eyes:  Conjunctiva clear, sclera anictericENT: Hearing intact to conversational speech,  external ears normal in appearanceResp:  Breathing comfortably on RANeuro:  Alert, no gross focal motor deficits, fluent speechSkin/Ext:  WWP, fluctuant, warm, swollen pocket on right lateral breast without tenderness to touch (no feeling in that location at  baseline).Assessment/Plan:Assessment & Plan1. Abscess in the right breast:- The abscess appears to be improving, but drainage is still present. It looks consistent with an abscess that may need drainage.- A follow-up appointment will be scheduled in 1 week for potential drainage of the abscess. If the abscess improves with antibiotic treatment and does not require drainage, she can cancel the appointment.- A prescription for Bactrim , to be taken twice daily for 7 days, has been provided.- She has been advised to apply mupirocin  ointment to her nose twice daily for 10 days and to use chlorhexidine wash once daily for 14 days.- If she opts for wipes, she should use them after soaping up. She has been instructed not to reuse washcloths to prevent reinfection.- Referral sent to new dermatologist for recurrent abscesses and management of hidradenitis suppurativa.- mupirocin  (BACTROBAN ) 2 % ointment; Apply topically 3 times daily for MRSA. to the following areas: bilateral nares  Dispense: 22 g; Refill: 1- AMB REFERRAL TO DERMATOLOGY- sulfamethoxazole -trimethoprim  (BACTRIM  DS,SEPTRA  DS) 800-160 mg tablet; Take 1 tablet by mouth 2 times daily for 7 days for Infection of the Skin and/or Soft Tissue.  Dispense: 14 tablet; Refill: 0- CHLORHEXIDINE GLUCONATE, BULK, SOLN; By 2 Capfuls no specified route daily.  Dispense: 500 mL; Refill: 0Follow up: Follow up in about 1 week (around 11/13/2024) for Follow-up drain abscess.Johnston Rue, [facility] Clinical Faculty, Department of [facility] [address], [place], [place] [address]Eynwz: [phone]: [phone] my patients:  Some of my notes are dictated using voice-recognition program which may result in minor transcription errors.  If you have any urgent concerns, please contact me through MyChart.  Please bring any non-urgent concerns to your next appointment so we can discuss them.  Thank  you!

## 2024-11-08 ENCOUNTER — Encounter: Payer: Self-pay | Admitting: Gastroenterology

## 2024-11-13 ENCOUNTER — Ambulatory Visit: Payer: Medicare (Managed Care) | Admitting: Family Medicine

## 2024-11-13 ENCOUNTER — Ambulatory Visit: Payer: Medicare (Managed Care) | Admitting: Primary Care

## 2024-11-20 ENCOUNTER — Other Ambulatory Visit: Payer: Self-pay

## 2024-11-20 DIAGNOSIS — E119 Type 2 diabetes mellitus without complications: Secondary | ICD-10-CM

## 2024-11-20 DIAGNOSIS — M818 Other osteoporosis without current pathological fracture: Secondary | ICD-10-CM

## 2024-11-22 ENCOUNTER — Encounter: Payer: Self-pay | Admitting: Family Medicine

## 2024-11-22 ENCOUNTER — Other Ambulatory Visit: Payer: Self-pay

## 2024-11-22 ENCOUNTER — Encounter: Payer: Self-pay | Admitting: Pharmacist

## 2024-11-27 ENCOUNTER — Other Ambulatory Visit: Payer: Self-pay

## 2024-11-27 ENCOUNTER — Ambulatory Visit: Payer: Self-pay

## 2024-11-28 ENCOUNTER — Other Ambulatory Visit: Payer: Self-pay

## 2024-12-05 ENCOUNTER — Other Ambulatory Visit: Payer: Self-pay

## 2024-12-06 ENCOUNTER — Ambulatory Visit: Payer: Medicare (Managed Care)

## 2024-12-18 ENCOUNTER — Telehealth: Payer: Medicare (Managed Care) | Admitting: Family Medicine

## 2024-12-18 ENCOUNTER — Other Ambulatory Visit: Payer: Self-pay

## 2024-12-18 DIAGNOSIS — L732 Hidradenitis suppurativa: Secondary | ICD-10-CM

## 2024-12-18 MED ORDER — DOXYCYCLINE HYCLATE 100 MG PO TABS *I*: 100.0000 mg | ORAL_TABLET | Freq: Two times a day (BID) | ORAL | 0 refills | Status: AC

## 2024-12-18 NOTE — Telephone Encounter (Signed)
 This patient attachment is clinically relevant.  Please keep in the patient's chart.[]  Document[x]  PhotoBrief attachment description: mass in [place] (Ex. L forearm rash, WC papers)Thank you,Richie Bonanno, LPN

## 2024-12-18 NOTE — Progress Notes (Signed)
 [name],Please see the MyChart message reply (Patient View Only) to our thread subject: Anabiotic for my assessment and plan.This patient gave consent for this Medical Advice Message and is aware that it may result in a bill to their insurance, as well as the possibility of receiving a bill for a copay and/or deductible.  They are an established patient, but are not seeing information exclusively about a problem treated during an in-person or video visit in the last seven days.I spent a total of 7 minutes on 12/18/2024 reviewing the patients prior medical records and current request for medical advice, prescribing medications or ordering tests (if applicable), replying to the patient, and documenting the encounter.

## 2024-12-19 ENCOUNTER — Other Ambulatory Visit: Payer: Self-pay

## 2024-12-19 ENCOUNTER — Ambulatory Visit: Payer: Self-pay | Admitting: Pharmacist

## 2024-12-19 DIAGNOSIS — M818 Other osteoporosis without current pathological fracture: Secondary | ICD-10-CM

## 2024-12-19 DIAGNOSIS — E119 Type 2 diabetes mellitus without complications: Secondary | ICD-10-CM

## 2024-12-25 ENCOUNTER — Other Ambulatory Visit: Payer: Self-pay

## 2024-12-26 ENCOUNTER — Other Ambulatory Visit: Payer: Self-pay

## 2024-12-29 ENCOUNTER — Ambulatory Visit: Payer: Medicare (Managed Care) | Admitting: Physician Assistant

## 2024-12-29 ENCOUNTER — Telehealth: Payer: Self-pay

## 2024-12-29 NOTE — Telephone Encounter (Signed)
 Copied from CRM #[id number]. Topic: Appointments - Cancel/Reschedule>> Dec 29, 2024  8:35 AM Rollo BIRCH wrote:[name], Patient,  is calling to cancel the patients  NPV appointment which is currently scheduled on 12/29/24 with Both.Reason for the cancellation: sick Has the appointment been cancelled? yesHas the appointment been rescheduled? No, was double booked already has 01/01/25 apt NPV already Does the patient need a call back to reschedule?noPatient can be reached at [phone]

## 2025-01-01 ENCOUNTER — Other Ambulatory Visit: Payer: Self-pay

## 2025-01-01 ENCOUNTER — Telehealth: Payer: Self-pay

## 2025-01-01 ENCOUNTER — Encounter: Payer: Self-pay | Admitting: Gastroenterology

## 2025-01-01 ENCOUNTER — Ambulatory Visit: Payer: Medicare (Managed Care) | Attending: Gastroenterology | Admitting: Gastroenterology

## 2025-01-01 VITALS — BP 130/83 | HR 104 | Temp 95.0°F | Ht 65.0 in

## 2025-01-01 DIAGNOSIS — K5900 Constipation, unspecified: Secondary | ICD-10-CM | POA: Insufficient documentation

## 2025-01-01 DIAGNOSIS — R140 Abdominal distension (gaseous): Secondary | ICD-10-CM | POA: Insufficient documentation

## 2025-01-01 DIAGNOSIS — K592 Neurogenic bowel, not elsewhere classified: Secondary | ICD-10-CM | POA: Insufficient documentation

## 2025-01-01 MED ORDER — LINACLOTIDE 145 MCG PO CAPS *I*: 145.0000 ug | ORAL_CAPSULE | Freq: Every day | ORAL | 2 refills | Status: AC

## 2025-01-01 NOTE — Telephone Encounter (Signed)
"  Added to waitlist  "

## 2025-01-01 NOTE — Telephone Encounter (Signed)
 Spoke with Cindy Ochoa , patient, at check out: Patient was seen by Warren Schiller, NP today and the Provider's recommendations were: Follow up in about 3 months (around 04/01/2025) for Follow-up.  Unable to schedule all appts due to  no availability for time requested.  AVS provided to patient via my chart.

## 2025-01-01 NOTE — Patient Instructions (Addendum)
-   Obtain KUB in the next few days to assess for stool burden  - If KUB shows stool burden:---- complete bowel clean out---- start linzess  145 mcg once a day- Can continue dulcolax suppositories and saline enemas for now, but plan to wean off in a few weeks. If experiencing diarrhea, stop the suppositories and enemas but continue linzess . If still having diarrhea on linzess , reach out to the office.- Recommended adding in dietary fiber - goal of 30-40g a day- Drink 64 fl oz of water - Follow up in 3-4 months
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
## 2025-01-01 NOTE — Telephone Encounter (Signed)
 Spoke with Cindy Ochoa , patient, at check out: Patient was seen by Warren Schiller, NP today and the Provider's recommendations were:  . : Follow up in about 3 months (around 04/01/2025) for Follow-up. Unable to schedule all appts due to  no available opening for time frame requested  AVS provided to patient via  my chart

## 2025-01-16 ENCOUNTER — Other Ambulatory Visit: Payer: Self-pay

## 2025-01-16 ENCOUNTER — Ambulatory Visit: Payer: Self-pay

## 2025-01-16 DIAGNOSIS — E119 Type 2 diabetes mellitus without complications: Secondary | ICD-10-CM

## 2025-01-16 DIAGNOSIS — M818 Other osteoporosis without current pathological fracture: Secondary | ICD-10-CM

## 2025-01-17 ENCOUNTER — Encounter: Payer: Self-pay | Admitting: Family Medicine

## 2025-01-18 NOTE — Telephone Encounter (Signed)
 Last seen in office:11/10/2025Next scheduled office visit is:05/23/2025

## 2025-01-19 ENCOUNTER — Other Ambulatory Visit: Payer: Self-pay

## 2025-01-19 ENCOUNTER — Other Ambulatory Visit
Admission: RE | Admit: 2025-01-19 | Discharge: 2025-01-19 | Disposition: A | Discharge status: Home / Self Care | Payer: Medicare (Managed Care) | Source: Ambulatory Visit | Attending: Family Medicine | Admitting: Family Medicine

## 2025-01-19 ENCOUNTER — Encounter: Payer: Self-pay | Admitting: Family Medicine

## 2025-01-19 ENCOUNTER — Ambulatory Visit: Payer: Medicare (Managed Care) | Attending: Family Medicine | Admitting: Family Medicine

## 2025-01-19 VITALS — BP 84/56 | HR 109 | Ht 65.0 in

## 2025-01-19 DIAGNOSIS — R8290 Unspecified abnormal findings in urine: Secondary | ICD-10-CM | POA: Insufficient documentation

## 2025-01-19 DIAGNOSIS — Z22322 Carrier or suspected carrier of Methicillin resistant Staphylococcus aureus: Secondary | ICD-10-CM

## 2025-01-19 DIAGNOSIS — I959 Hypotension, unspecified: Secondary | ICD-10-CM

## 2025-01-19 DIAGNOSIS — L0291 Cutaneous abscess, unspecified: Secondary | ICD-10-CM

## 2025-01-19 LAB — URINALYSIS WITH MICROSCOPIC
Glucose,UA: NEGATIVE
Ketones, UA: NEGATIVE
Nitrite,UA: NEGATIVE
Specific Gravity,UA: 1.015 (ref 1.002–1.030)
WBC,UA: >50 /HPF — AB (ref 0–5)
pH,UA: 6.5 (ref 5.0–8.0)

## 2025-01-19 MED ORDER — MIDODRINE HCL 2.5 MG PO TABS *I*: 2.5000 mg | ORAL_TABLET | Freq: Three times a day (TID) | ORAL | 0 refills | Status: AC

## 2025-01-19 NOTE — Progress Notes (Signed)
 [facility] Outpatient Progress Note:Subjective:Cindy Ochoa is a 39 y.o. female presenting for Follow-up (Abscess on neck )YEP:Ypdunmb of Present IllnessThe patient is a 39 year old female who presents for evaluation of a left neck abscess, suspected urinary tract infection, and low blood pressure.Recurrent abscessesShe reports an abscess in her neck that she noticed last week.  Last year she was started on long-term 72-month course of doxycycline  to treat MRSA colonization in addition to a course of Bactrim  back in November 2025, and a recent short course of doxycycline  that she finished 2 to 3 weeks ago, during which this abscess developed.  She has had several recurrent abscesses over the last year and a known diagnosis of hidradenitis suppurativa.  She has not consulted with dermatology due to her reluctance to receive steroid injections. The abscesses are not painful but cause a mild burning sensation. The current abscess began draining 2 days ago after she noticed it swelling up last week. She observed that excessive sweating, such as when she was out of town, seems to trigger the swelling. She also reports frequent sweating due to her body temperature. She was prescribed chlorhexidine  solution, but her insurance did not cover it. She was also given mupirocin  ointment for nasal application. Malodorous urineShe suspects a urinary tract infection (UTI) due to the strong odor of her urine. She has a urostomy bag. She changes her urostomy bag every 3 days, with the last change being yesterday. She is scheduled to change it again tomorrow. She reports no fevers, night sweats, or chills. She does not feel unwell as she typically would with a UTI.HypotensionHer blood pressure is typically low, and she is not currently on any blood pressure medication. She does not monitor her blood pressure at home, although she has a cuff. She reports no dizziness or  lightheadedness. She has been under stress recently due to a family incident and admits to not eating or drinking adequately. She is gradually trying to improve her diet and hydration. Objective:BP (!) 84/56 (BP Location: Left arm, Patient Position: Sitting, Cuff Size: adult)   Pulse 109   Ht 1.651 m (5' 5)   SpO2 99%   BMI 24.96 kg/m Vitals reviewedGeneral: Well-appearing, well-nourished, NAD Eyes:  Conjunctiva clear, sclera anictericENT: Hearing intact to conversational speech,  external ears normal in appearanceNeck: No adenopathy, thyromegaly or nodularity, no extrathyroidal massesCV:  RRR, S1/S2 normal without m/r/g, no LE edemaResp:  Breathing comfortably on RA, CTABNeuro:  Alert, no gross focal motor deficits, fluent speechSkin/Ext:  WWP, small 1.5 cm draining abscess on left lateral neck.Assessment/Plan:1. Abscess (Primary)2. MRSA (methicillin resistant Staphylococcus aureus) carrier- Since abscess is draining, I do not think additional antibiotics are necessary at this time.  D/t recurrence of abscesses, I would recommend that she see how much the chlorhexidine  wash would be without insurance and apply wash daily for 14 days.  She can also try additional 14 days of mupirocin  intranasally.-I would recommend that she plan to follow-up with dermatology.  New referral placed.- AMB REFERRAL TO DERMATOLOGY3. Malodorous urine-D/t urostomy bag unable to collect sample during office visit.  She plans to change her bag when she gets home and will collect a sample and have her aide drop it off for her at the lab later today.  Will possibly treat pending results.-Encouraged fluids and rest.- Urinalysis with microscopic; Future- Aerobic bacterial urine culture (urine-voided); Future4. Hypotension, unspecified hypotension type-Patient evaluated by myself and Dr. Dale Lamp due to hypotension.  Patient is asymptomatic and reports not eating or  drinking as  much as normal.  Would recommend starting low-dose midodrine  3 times daily as needed for hypotension with close follow-up in 1 week.  She will check her blood pressure at home daily for the next week with a low threshold to go to the ED if <80/60 or she is symptomatic with fevers, night sweats, chills, malaise, or worsening spasms.- midodrine  (PROAMATINE ) 2.5 MG tablet; Take 1 tablet (2.5 mg total) by mouth 3 times daily.  Dispense: 90 tablet; Refill: 0Follow up: Follow up if symptoms worsen or fail to improve.Johnston Rue, [facility] Clinical Faculty, Department of [facility] [address], [place], [place] [address]Eynwz: [phone]: [phone] my patients:  Some of my notes are dictated using voice-recognition program which may result in minor transcription errors.  If you have any urgent concerns, please contact me through MyChart.  Please bring any non-urgent concerns to your next appointment so we can discuss them.  Thank you!

## 2025-01-20 LAB — AEROBIC BACTERIAL URINE CULTURE: Aerobic bacterial urine culture: 0

## 2025-01-22 ENCOUNTER — Ambulatory Visit: Payer: Self-pay | Admitting: Family Medicine

## 2025-01-22 ENCOUNTER — Telehealth: Payer: Self-pay | Admitting: Family Medicine

## 2025-01-22 ENCOUNTER — Telehealth: Payer: Self-pay

## 2025-01-22 NOTE — Telephone Encounter (Signed)
 Medication: midodrine  (PROAMATINE ) 2.5 MG tablet Medication Tried: noneDiagnosis: Hypotension

## 2025-01-22 NOTE — Telephone Encounter (Signed)
 Patient is calling the symptomatic line: Last week patient had a urine sample completed due to UTI symptoms - chills but no fever, odor to urine Patient is asking for urology to review urine and reach out to her. Urgent care informed her to continue to monitor for now and stay hydrated to see if the odor improves but she thinks that Urology should still review. Patient was with a Nurse at the time who said that last week patient had issues with her blood pressure dropping. Writer attempted to contact [place] office to speak with patient and the nurse, but no answer to calls placed to [facility] lines

## 2025-01-23 ENCOUNTER — Other Ambulatory Visit: Payer: Self-pay

## 2025-01-23 NOTE — Telephone Encounter (Signed)
 Notified pharmacy that it did not need approval - they will notify patient when medication is ready for pick up

## 2025-02-09 ENCOUNTER — Ambulatory Visit: Payer: Medicare (Managed Care) | Admitting: Physical Medicine and Rehabilitation

## 2025-02-16 ENCOUNTER — Other Ambulatory Visit: Payer: Medicare Other | Admitting: Radiology

## 2025-02-21 ENCOUNTER — Other Ambulatory Visit: Payer: Medicare (Managed Care)

## 2025-02-22 ENCOUNTER — Ambulatory Visit: Payer: Medicare Other

## 2025-05-23 ENCOUNTER — Encounter: Admitting: Family Medicine
